# Patient Record
Sex: Male | Born: 1950
Health system: Southern US, Community
[De-identification: ages and names within clinical notes are randomized; demographics above are authoritative.]

## PROBLEM LIST (undated history)

## (undated) DIAGNOSIS — K219 Gastro-esophageal reflux disease without esophagitis: Secondary | ICD-10-CM

## (undated) DIAGNOSIS — C799 Secondary malignant neoplasm of unspecified site: Secondary | ICD-10-CM

## (undated) DIAGNOSIS — Z87442 Personal history of urinary calculi: Secondary | ICD-10-CM

## (undated) DIAGNOSIS — K429 Umbilical hernia without obstruction or gangrene: Secondary | ICD-10-CM

## (undated) DIAGNOSIS — I739 Peripheral vascular disease, unspecified: Secondary | ICD-10-CM

## (undated) DIAGNOSIS — C61 Malignant neoplasm of prostate: Secondary | ICD-10-CM

## (undated) DIAGNOSIS — I10 Essential (primary) hypertension: Secondary | ICD-10-CM

## (undated) DIAGNOSIS — Z973 Presence of spectacles and contact lenses: Secondary | ICD-10-CM

## (undated) DIAGNOSIS — E785 Hyperlipidemia, unspecified: Secondary | ICD-10-CM

## (undated) DIAGNOSIS — Z972 Presence of dental prosthetic device (complete) (partial): Secondary | ICD-10-CM

## (undated) DIAGNOSIS — Z89611 Acquired absence of right leg above knee: Secondary | ICD-10-CM

## (undated) HISTORY — PX: ROBOT ASSISTED LAPAROSCOPIC RADICAL PROSTATECTOMY: SHX5141

## (undated) HISTORY — PX: COLONOSCOPY: SHX174

## (undated) HISTORY — PX: TONSILLECTOMY: SUR1361

---

## 2005-05-04 ENCOUNTER — Ambulatory Visit (HOSPITAL_COMMUNITY): Admission: RE | Admit: 2005-05-04 | Discharge: 2005-05-04 | Payer: Self-pay | Admitting: General Surgery

## 2009-10-19 ENCOUNTER — Ambulatory Visit (HOSPITAL_COMMUNITY): Admission: RE | Admit: 2009-10-19 | Discharge: 2009-10-19 | Payer: Self-pay | Admitting: Family Medicine

## 2009-12-25 DIAGNOSIS — C61 Malignant neoplasm of prostate: Secondary | ICD-10-CM

## 2009-12-25 HISTORY — PX: ROBOT ASSISTED LAPAROSCOPIC RADICAL PROSTATECTOMY: SHX5141

## 2009-12-25 HISTORY — DX: Malignant neoplasm of prostate: C61

## 2010-01-07 ENCOUNTER — Ambulatory Visit (HOSPITAL_COMMUNITY): Admission: RE | Admit: 2010-01-07 | Discharge: 2010-01-07 | Payer: Self-pay | Admitting: Urology

## 2010-03-16 ENCOUNTER — Inpatient Hospital Stay (HOSPITAL_COMMUNITY): Admission: RE | Admit: 2010-03-16 | Discharge: 2010-03-18 | Payer: Self-pay | Admitting: Urology

## 2010-03-16 ENCOUNTER — Encounter (INDEPENDENT_AMBULATORY_CARE_PROVIDER_SITE_OTHER): Payer: Self-pay | Admitting: Urology

## 2010-11-24 IMAGING — CR DG PELVIS 1-2V
1 series · 1 of 1 positions shown · non-contrast
Comparison: [HOSPITAL] total body bone scan of nuclear
medicine 01/07/2010.

CLINICAL DATA: Prostate cancer.

PELVIS - 1-2 VIEW

[t pelvis a.p.]
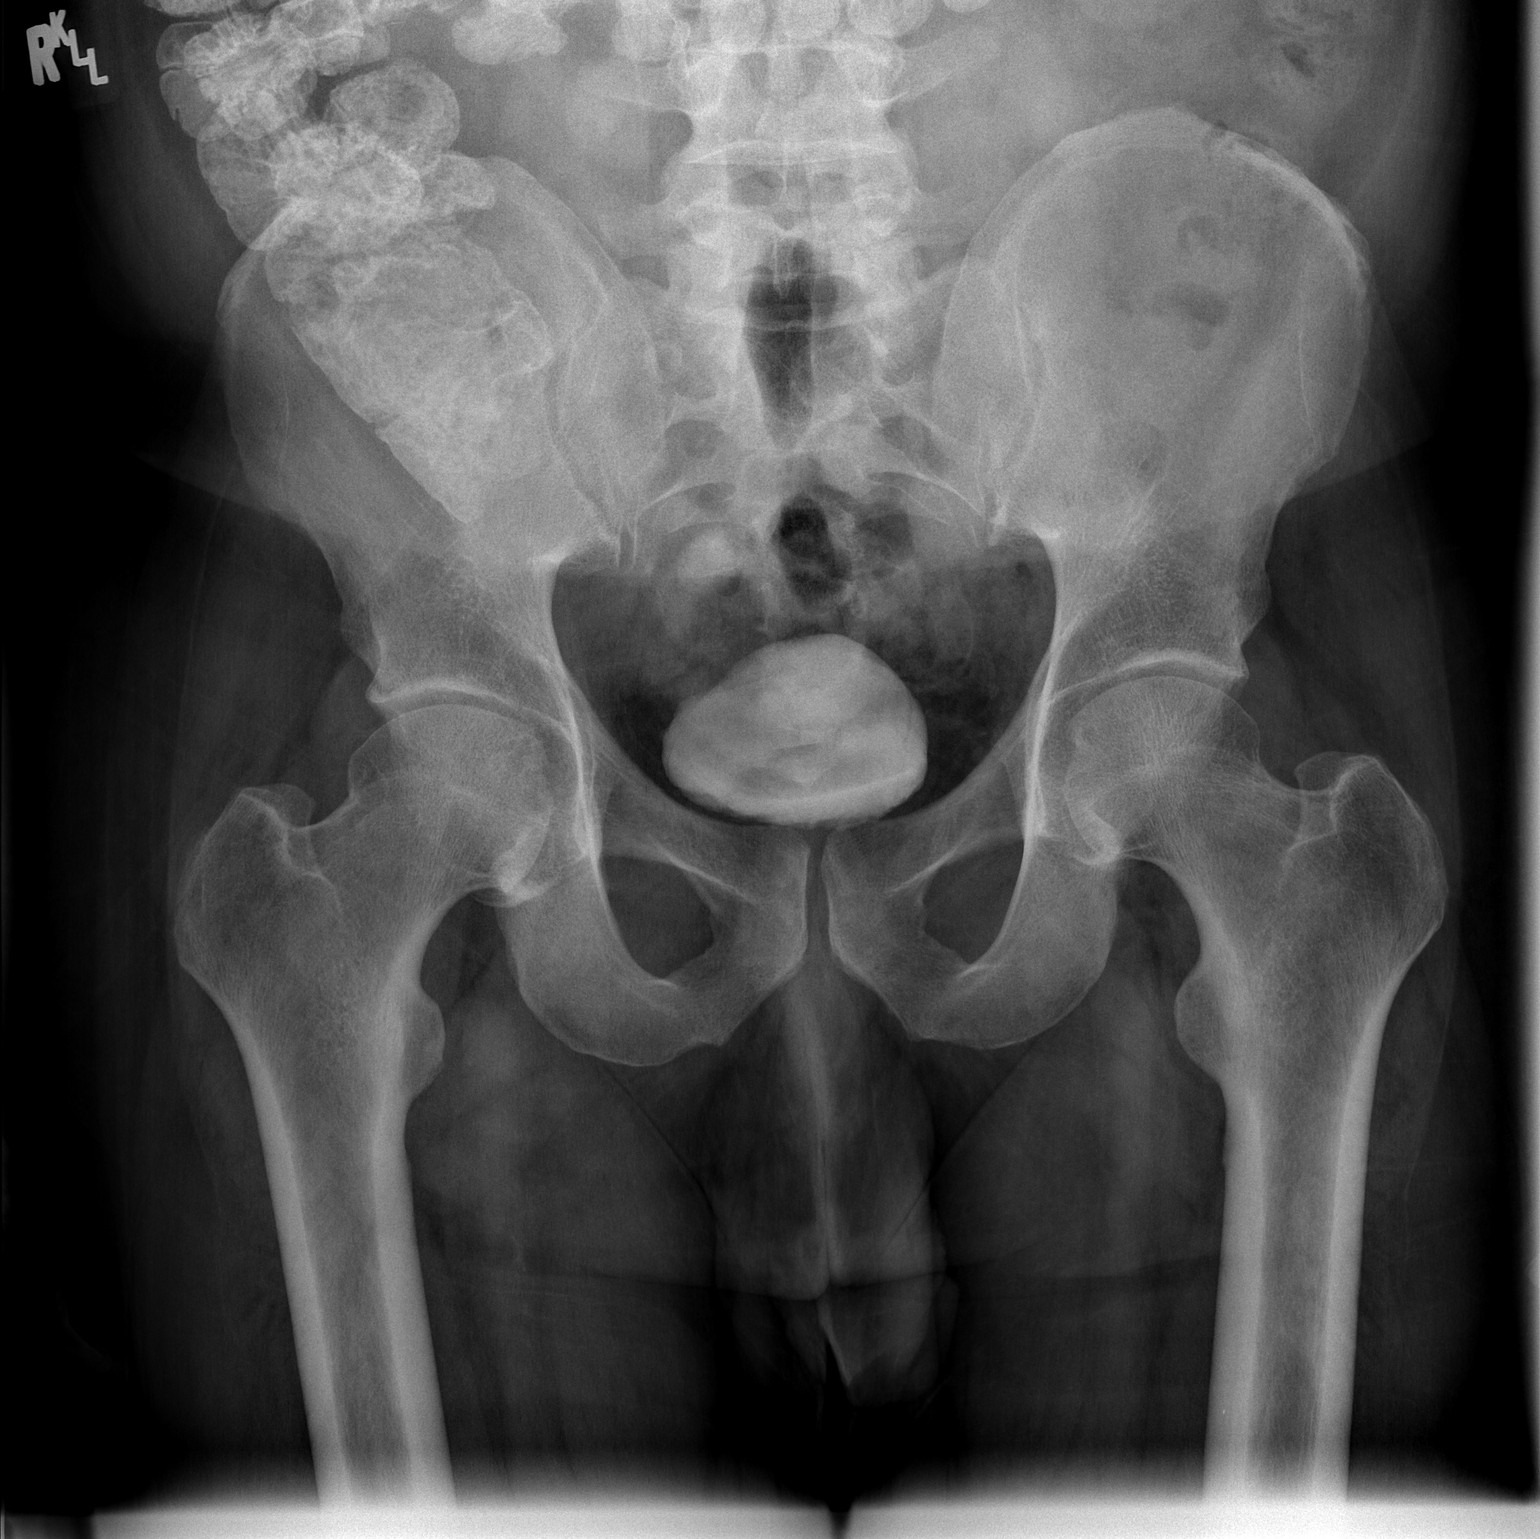

[1 of 1 positions shown; findings below may reference images not displayed]

FINDINGS: Bladder and intestinal contrast from probable preceding
abdominal pelvic CT visualized.  Visualized bones demonstrate no
osteoblastic metastatic disease (specifically at the right superior
sacroiliac joint in region of slight increased uptake on bone
scan).
IMPRESSION: Negative.

## 2011-02-01 ENCOUNTER — Encounter: Payer: Self-pay | Admitting: Internal Medicine

## 2011-02-07 ENCOUNTER — Encounter: Payer: Self-pay | Admitting: Internal Medicine

## 2011-02-09 NOTE — Letter (Signed)
Summary: TCS TRIAGE  TCS TRIAGE   Imported By: Rexene Alberts 02/01/2011 13:43:41  _____________________________________________________________________  External Attachment:    Type:   Image     Comment:   External Document  Appended Document: TCS TRIAGE ok as is  Appended Document: [facility]CS TRIAGE MAILED PATIENT INSTRUCTIONS

## 2011-02-14 ENCOUNTER — Ambulatory Visit (HOSPITAL_COMMUNITY)
Admission: RE | Admit: 2011-02-14 | Discharge: 2011-02-14 | Disposition: A | Discharge status: Home / Self Care | Payer: BC Managed Care – PPO | Source: Ambulatory Visit | Attending: Internal Medicine | Admitting: Internal Medicine

## 2011-02-14 ENCOUNTER — Encounter: Payer: BC Managed Care – PPO | Admitting: Internal Medicine

## 2011-02-14 ENCOUNTER — Other Ambulatory Visit: Payer: Self-pay | Admitting: Internal Medicine

## 2011-02-14 DIAGNOSIS — I10 Essential (primary) hypertension: Secondary | ICD-10-CM | POA: Insufficient documentation

## 2011-02-14 DIAGNOSIS — Z09 Encounter for follow-up examination after completed treatment for conditions other than malignant neoplasm: Secondary | ICD-10-CM

## 2011-02-14 DIAGNOSIS — Z7982 Long term (current) use of aspirin: Secondary | ICD-10-CM | POA: Insufficient documentation

## 2011-02-14 DIAGNOSIS — Z860100 Personal history of colon polyps, unspecified: Secondary | ICD-10-CM | POA: Insufficient documentation

## 2011-02-14 DIAGNOSIS — K648 Other hemorrhoids: Secondary | ICD-10-CM

## 2011-02-14 DIAGNOSIS — D126 Benign neoplasm of colon, unspecified: Secondary | ICD-10-CM

## 2011-02-14 DIAGNOSIS — Z79899 Other long term (current) drug therapy: Secondary | ICD-10-CM | POA: Insufficient documentation

## 2011-02-16 NOTE — Op Note (Signed)
  NAME:  [name], [name]           ACCOUNT NO.:  [id number]  MEDICAL RECORD NO.:  [id number]           PATIENT TYPE:  O  LOCATION:  DAYP                          FACILITY:  [facility]  PHYSICIAN:  R. Roetta Sessions, M.D. DATE OF BIRTH:  1951/10/11  DATE OF PROCEDURE:  02/14/2011 DATE OF DISCHARGE:                              OPERATIVE REPORT   PROCEDURE:  Ileal colonoscopy with snare polypectomy, polyp ablation.  INDICATIONS FOR PROCEDURE:  A 60 year old gentleman who underwent colonoscopy in 2006 by Dr. Katrinka Blazing, multiple left colon polyps were removed that were adenomatous.  He has no lower GI tract symptoms.  He is here for surveillance.  Risks, benefits, limitations, alternatives, imponderables have been reviewed.  Questions have been answered.  Please see the documentation in the medical record.  PROCEDURE NOTE:  O2 saturation, blood pressure, pulse, respirations were monitored throughout the entirety of the procedure.  CONSCIOUS SEDATION:  Versed 5 mg IV, Demerol 75 mg IV in divided doses.  INSTRUMENT:  Pentax video chip system.  FINDINGS:  Digital rectal exam revealed no abnormalities.  Endoscopic findings:  Prep was adequate.  Colon:  Colonic mucosa was surveyed from the rectosigmoid junction through the left transverse right colon to the appendiceal orifice, ileocecal valve/cecum.  These structures were well seen and photographed for the record.  Terminal ileum was abated to 5 cm.  From this level, scope was slowly and cautiously withdrawn.  All previously mentioned mucosal surfaces were again seen.  The patient was noted have a 5-mm polyp in the ascending colon which was cold snared and recovered through the scope.  There were also multiple descending polyps which were cold snared as well, and there were some diminutive sigmoid polyps which were ablated with the tip of the hot snare cautery unit. Remainder of colonic mucosa appeared unremarkable.  Scope was pulled down to  the rectum where thorough examination of rectal mucosa including retroflexed view of the anal verge demonstrated only internal hemorrhoids.  The patient tolerated the procedure well.  Cecal withdrawal time 15 minutes.  IMPRESSION: 1. Internal hemorrhoids, otherwise normal rectum. 2. Diminutive sigmoid polyp status post hot snare ablation, ascending     polyps and descending polyp status post cold snare polypectomy.     Remainder of colonic mucosa appeared unremarkable.  Normal terminal     ileum.  RECOMMENDATIONS: 1. Follow up on path. 2. Diverticulosis literature provided to [name].     Jonathon Bellows, M.D.     RMR/MEDQ  D:  02/14/2011  T:  02/14/2011  Job:  [id number]  cc:   Annia Friendly. Loleta Chance, MD Fax: [phone]  Electronically Signed by Lorrin Goodell M.D. on 02/16/2011 08:50:06 AM

## 2011-02-19 ENCOUNTER — Encounter: Payer: Self-pay | Admitting: Internal Medicine

## 2011-02-20 ENCOUNTER — Encounter: Payer: Self-pay | Admitting: Internal Medicine

## 2011-03-02 NOTE — Letter (Addendum)
Summary: Patient Notice, Colon Biopsy Results  [facility]  [address]   [place], [place] [address]   Phone: [phone]  Fax: [phone]       February 20, 2011   [name] [address] [place], [place]  [address] [birth year]    Dear [name],  I am pleased to inform you that the biopsies taken during your recent colonoscopy did not show any evidence of cancer upon pathologic examination.  Additional information/recommendations:  No further action is needed at this time.  Please follow-up with your primary care physician for your other healthcare needs.  You should have a repeat colonoscopy examination  in 5 years.  Please call us if you are having persistent problems or have questions about your condition that have not been fully answered at this time.  Sincerely,    R. Roetta Sessions MD, FACP [facility] Ph: [phone]    Fax: [phone]   Appended Document: Patient Notice, Colon Biopsy Results letter mailed to pt  Appended Document: Patient Notice, Colon Biopsy Results reminder in epic

## 2011-03-02 NOTE — Letter (Addendum)
Summary: Patient Notice, Colon Biopsy Results  [facility]  [address]   [place], [place] [address]   Phone: [phone]  Fax: [phone]       February 19, 2011   [name] [address] [place], [place]  [address] [birth year]    Dear [name],  I am pleased to inform you that the biopsies taken during your recent colonoscopy did not show any evidence of cancer upon pathologic examination.  Additional information/recommendations:  No further action is needed at this time.  Please follow-up with your primary care physician for your other healthcare needs.  You should have a repeat colonoscopy examination  in 5 years.  Please call us if you are having persistent problems or have questions about your condition that have not been fully answered at this time.  Sincerely,    R. Roetta Sessions MD, FACP [address] [facility] Ph: [phone]    Fax: [phone]   Appended Document: Patient Notice, Colon Biopsy Results letter mailed to pt  Appended Document: Patient Notice, Colon Biopsy Results reminder in epic

## 2011-03-20 LAB — TYPE AND SCREEN: Antibody Screen: NEGATIVE

## 2011-03-20 LAB — COMPREHENSIVE METABOLIC PANEL
ALT: 16 U/L (ref 0–53)
AST: 18 U/L (ref 0–37)
Albumin: 4.1 g/dL (ref 3.5–5.2)
Alkaline Phosphatase: 68 U/L (ref 39–117)
BUN: 25 mg/dL — ABNORMAL HIGH (ref 6–23)
CO2: 30 mEq/L (ref 19–32)
Calcium: 9.6 mg/dL (ref 8.4–10.5)
Chloride: 106 mEq/L (ref 96–112)
GFR calc non Af Amer: >60 mL/min (ref >60)
Glucose, Bld: 123 mg/dL — ABNORMAL HIGH (ref 70–99)
Potassium: 4.3 mEq/L (ref 3.5–5.1)
Total Protein: 7.2 g/dL (ref 6.0–8.3)

## 2011-03-20 LAB — BASIC METABOLIC PANEL
BUN: 19 mg/dL (ref 6–23)
CO2: 27 mEq/L (ref 19–32)
Creatinine, Ser: 1.46 mg/dL (ref 0.4–1.5)
GFR calc non Af Amer: 25 mL/min — ABNORMAL LOW (ref >60)
GFR calc non Af Amer: 49 mL/min — ABNORMAL LOW (ref >60)
Glucose, Bld: 117 mg/dL — ABNORMAL HIGH (ref 70–99)
Potassium: 4.2 mEq/L (ref 3.5–5.1)
Sodium: 138 mEq/L (ref 135–145)

## 2011-03-20 LAB — MYOGLOBIN, URINE: Myoglobin, Ur: <27 mcg/L (ref <28)

## 2011-03-20 LAB — HEMOGLOBIN AND HEMATOCRIT, BLOOD
HCT: 31.5 % — ABNORMAL LOW (ref 39.0–52.0)
Hemoglobin: 10.6 g/dL — ABNORMAL LOW (ref 13.0–17.0)

## 2011-03-20 LAB — SODIUM, URINE, RANDOM: Sodium, Ur: 32 mEq/L

## 2011-03-20 LAB — CBC
MCHC: 33 g/dL (ref 30.0–36.0)
Platelets: 342 10*3/uL (ref 150–400)
RBC: 3.97 MIL/uL — ABNORMAL LOW (ref 4.22–5.81)

## 2011-03-20 LAB — CREATININE, URINE, RANDOM: Creatinine, Urine: 85.8 mg/dL

## 2011-03-20 LAB — CREATININE, FLUID (PLEURAL, PERITONEAL, JP DRAINAGE): Creat, Fluid: 2.4 mg/dL

## 2011-05-12 NOTE — H&P (Signed)
NAMEYOTAM, RHINE           ACCOUNT NO.:  [id number]   MEDICAL RECORD NO.:  [id number]          PATIENT TYPE:  AMB   LOCATION:  DAY                           FACILITY:  [facility]   PHYSICIAN:  Jerolyn Shin C. Katrinka Blazing, M.D.   DATE OF BIRTH:  Oct 14, 1951   DATE OF ADMISSION:  DATE OF DISCHARGE:  LH                                HISTORY & PHYSICAL   REASON FOR ADMISSION:  The patient has history of guaiac-positive stools and  need for screening colonoscopy.  He has had some periods of constipation.  He has not had any dark stools or bright red rectal bleeding.  He is having  screening colonoscopy.   MEDICATIONS:  None.   PAST SURGICAL HISTORY:  None.   PAST MEDICAL HISTORY:  History of kidney stones.   REVIEW OF SYSTEMS:  Negative.   PHYSICAL EXAMINATION:  VITAL SIGNS:  Blood pressure 122/74, pulse 64,  respirations 20, weight 222 pounds.  HEENT:  Unremarkable except for poor dentition and multiple cavities.  NECK:  Supple.  No JVD, bruits, adenopathy, or thyromegaly.  CHEST:  Clear to auscultation. No rales, rhonchi or wheezes.  HEART:  Regular rate and rhythm without murmur, gallop or rub.  ABDOMEN:  Soft, nontender.  No masses.  EXTREMITIES:  No cyanosis, clubbing or edema.  NEUROLOGIC:  No focal motor, sensory or cerebellar deficits.   IMPRESSION:  1.  Guaiac-positive stools.  2.  Benign prostatic hypertrophy.  3.  History of kidney stones.   PLAN:  Colonoscopy.      LCS/MEDQ  D:  05/03/2005  T:  05/04/2005  Job:  [id number]   cc:   [facility]  Fax: [phone]

## 2016-02-14 ENCOUNTER — Encounter: Payer: Self-pay | Admitting: Internal Medicine

## 2017-03-07 ENCOUNTER — Ambulatory Visit (INDEPENDENT_AMBULATORY_CARE_PROVIDER_SITE_OTHER): Payer: BLUE CROSS/BLUE SHIELD | Admitting: Orthopedic Surgery

## 2017-03-07 ENCOUNTER — Encounter: Payer: Self-pay | Admitting: Orthopedic Surgery

## 2017-03-07 VITALS — BP 137/86 | HR 85 | Wt 228.0 lb

## 2017-03-07 DIAGNOSIS — M6530 Trigger finger, unspecified finger: Secondary | ICD-10-CM | POA: Diagnosis not present

## 2017-03-07 NOTE — Progress Notes (Signed)
Patient ID: [name], male   DOB: [birth year], 66 y.o.   MRN: [id number]  Chief Complaint  Patient presents with  . Hand Problem    TRIGGER FINGER RT LONG    HPI [name] is a 66 y.o. male.  Presents with several year history of catching locking and pain over the A1 pulley of the right ring finger. He has tried splinting and medication without success presents for evaluation and treatment  Review of Systems Review of Systems  Constitutional: Positive for fever.  Skin: Negative for color change.  Neurological: Negative for weakness and numbness.   Social History Social History  Substance Use Topics  . Smoking status: Current Some Day Smoker  . Smokeless tobacco: Never Used  . Alcohol use Not on file    Allergies not on file  Current Outpatient Prescriptions  Medication Sig Dispense Refill  . Esomeprazole Magnesium (NEXIUM PO) Take by mouth.    . OXYBUTYNIN CHLORIDE PO Take by mouth.     No current facility-administered medications for this visit.      Physical Exam Physical Exam Blood pressure 137/86, pulse 85, weight 228 lb (103.4 kg). Appearance, there are no abnormalities in terms of appearance the patient was well-developed and well-nourished. The grooming and hygiene were normal.  Mental status orientation, there was normal alertness and orientation Mood pleasant Ambulatory status normal with no assistive devices  Examination of the Right hand reveals tenderness over the A1 pulley of the ring finger Range of motion remains normal with clicking on flexion extension.  Stability tests show no abnormality of the IP joints are MP joint. The FDP and FDS strength is normal Skin warm dry and intact without laceration or ulceration or erythema Neurologic examination normal sensation Vascular examination normal pulses with warm extremity and normal capillary refill  The opposite extremity normal range of motion    Data Reviewed No  x-rays  Assessment  Encounter Diagnosis  Name Primary?  Marland Kitchen Acquired trigger finger Yes      Plan  Right ring finger Medication  1 mL of 40 mg Depo-Medrol  2 mL of 1% lidocaine plain  Ethyl chloride for anesthesia  Verbal consent was obtained timeout was taken to confirm the injection site as right thumb  Alcohol was used to prepare the skin along with ethyl chloride and then the injection was made at the A1 pulley there were no complications

## 2019-02-21 ENCOUNTER — Encounter: Payer: Self-pay | Admitting: *Deleted

## 2019-02-25 ENCOUNTER — Other Ambulatory Visit: Payer: Self-pay | Admitting: Urology

## 2019-02-25 DIAGNOSIS — C61 Malignant neoplasm of prostate: Secondary | ICD-10-CM

## 2019-03-03 ENCOUNTER — Encounter: Payer: Self-pay | Admitting: Radiation Oncology

## 2019-03-03 NOTE — Progress Notes (Signed)
Opened in error

## 2019-03-04 ENCOUNTER — Telehealth: Payer: Self-pay | Admitting: Radiation Oncology

## 2019-03-04 ENCOUNTER — Ambulatory Visit
Admission: RE | Admit: 2019-03-04 | Discharge: 2019-03-04 | Disposition: A | Discharge status: Home / Self Care | Payer: Medicare Other | Source: Ambulatory Visit | Attending: Radiation Oncology | Admitting: Radiation Oncology

## 2019-03-04 ENCOUNTER — Ambulatory Visit: Payer: Medicare Other

## 2019-03-04 DIAGNOSIS — F17210 Nicotine dependence, cigarettes, uncomplicated: Secondary | ICD-10-CM | POA: Insufficient documentation

## 2019-03-04 DIAGNOSIS — Z7982 Long term (current) use of aspirin: Secondary | ICD-10-CM | POA: Insufficient documentation

## 2019-03-04 DIAGNOSIS — Z8042 Family history of malignant neoplasm of prostate: Secondary | ICD-10-CM | POA: Insufficient documentation

## 2019-03-04 DIAGNOSIS — C61 Malignant neoplasm of prostate: Secondary | ICD-10-CM | POA: Insufficient documentation

## 2019-03-04 DIAGNOSIS — Z800 Family history of malignant neoplasm of digestive organs: Secondary | ICD-10-CM | POA: Insufficient documentation

## 2019-03-04 HISTORY — DX: Malignant neoplasm of prostate: C61

## 2019-03-04 NOTE — Telephone Encounter (Signed)
Patient hasn't shown for 0930 appointment. Phoned patient to inquire. Patient expressed need to reschedule. Immediately transferred patient to Suanne Marker to reschedule.

## 2019-03-05 ENCOUNTER — Other Ambulatory Visit: Payer: Self-pay

## 2019-03-05 ENCOUNTER — Encounter (HOSPITAL_COMMUNITY)
Admission: RE | Admit: 2019-03-05 | Discharge: 2019-03-05 | Disposition: A | Discharge status: Home / Self Care | Payer: Medicare Other | Source: Ambulatory Visit | Attending: Urology | Admitting: Urology

## 2019-03-05 DIAGNOSIS — N200 Calculus of kidney: Secondary | ICD-10-CM | POA: Diagnosis not present

## 2019-03-05 DIAGNOSIS — C61 Malignant neoplasm of prostate: Secondary | ICD-10-CM | POA: Insufficient documentation

## 2019-03-05 DIAGNOSIS — N210 Calculus in bladder: Secondary | ICD-10-CM | POA: Diagnosis not present

## 2019-03-05 DIAGNOSIS — R918 Other nonspecific abnormal finding of lung field: Secondary | ICD-10-CM | POA: Diagnosis not present

## 2019-03-05 MED ORDER — TECHNETIUM TC 99M MEDRONATE IV KIT
21.2000 | PACK | Freq: Once | INTRAVENOUS | Status: AC | PRN
  Administered 2019-03-05: 21.2 via INTRAVENOUS

## 2019-03-06 ENCOUNTER — Encounter: Payer: Self-pay | Admitting: *Deleted

## 2019-03-17 ENCOUNTER — Telehealth: Payer: Self-pay | Admitting: Radiation Oncology

## 2019-03-17 NOTE — Telephone Encounter (Signed)
Phoned to confirm appointment for tomorrow morning. Patient denies fever, cough or SOB. Patient denies being in close contact with anyone who has been diagnosed with CVD 19. Explained that for safety reasons we are only allowing the patients in the building. However we will work with the patient to include their family member via phone. Patient verbalized understanding and expressed appreciation for the call.

## 2019-03-18 ENCOUNTER — Encounter: Payer: Self-pay | Admitting: Radiation Oncology

## 2019-03-18 ENCOUNTER — Ambulatory Visit
Admission: RE | Admit: 2019-03-18 | Discharge: 2019-03-18 | Disposition: A | Discharge status: Home / Self Care | Payer: Medicare Other | Source: Ambulatory Visit | Attending: Radiation Oncology | Admitting: Radiation Oncology

## 2019-03-18 ENCOUNTER — Other Ambulatory Visit: Payer: Self-pay

## 2019-03-18 DIAGNOSIS — C61 Malignant neoplasm of prostate: Secondary | ICD-10-CM

## 2019-03-18 DIAGNOSIS — Z8042 Family history of malignant neoplasm of prostate: Secondary | ICD-10-CM | POA: Diagnosis not present

## 2019-03-18 DIAGNOSIS — Z800 Family history of malignant neoplasm of digestive organs: Secondary | ICD-10-CM | POA: Diagnosis not present

## 2019-03-18 DIAGNOSIS — Z7982 Long term (current) use of aspirin: Secondary | ICD-10-CM | POA: Diagnosis not present

## 2019-03-18 DIAGNOSIS — F17210 Nicotine dependence, cigarettes, uncomplicated: Secondary | ICD-10-CM | POA: Diagnosis not present

## 2019-03-18 NOTE — Progress Notes (Signed)
See progress note under physician encounter. 

## 2019-03-18 NOTE — Progress Notes (Addendum)
GU Location of Tumor / Histology: prostatic adenocarcinoma  If Prostate Cancer, Gleason Score is (3 + 4) and PSA is (13.3) at diagnosis.   [name] s/p RALP in 2011. In December 2014 PSA was up to 0.16. He understood that this was highly suggestive of a biochemical recurrence. He elected not to proceed with an attempted salvage radiation therapy and chose ongoing surveillance. Repeat PSA in 05/2014 was 0.17.   01/2018             PSA                 0.51 07/2017             PSA                 0.39   Biopsies of prostate (if applicable) revealed:    Past/Anticipated interventions by urology, if any: prostate biopsy, prostatectomy, referral for consideration of radiation therapy, bone scan (negative).  Past/Anticipated interventions by medical oncology, if any: no  Weight changes, if any: no  Bowel/Bladder complaints, if any: AUA 2. SHIM 3. Reports occasional stress incontinence requiring 1-2 pads per day if he is at home or up to six ppd if he is working (explains he walks a lot at work). Denies dysuria or hematuria.   Nausea/Vomiting, if any: no  Pain issues, if any:  no  SAFETY ISSUES:  Prior radiation? no  Pacemaker/ICD? no  Possible current pregnancy? no, male patient  Is the patient on methotrexate? no  Current Complaints / other details:  68 year old male. Single. Has one son. Current everyday smoker. Mother-gastric ca. Father-prostate ca.

## 2019-03-18 NOTE — Progress Notes (Signed)
Radiation Oncology         [phone] ________________________________  Initial Outpatient Consultation - Conducted via Webex due to current COVID-19 concerns for limiting patient exposure  Name: [name] MRN: [id number]  Date: 03/18/2019  DOB: 03/24/1951  HE:RDEY, Berneta Sages, MD  Davis Gourd*   REFERRING PHYSICIAN: Davis Gourd*  DIAGNOSIS: 68 y.o. gentleman with biochemical recurrence of prostate cancer with PSA of 0.69.    ICD-10-CM   1. Malignant neoplasm of prostate ([place]) C61     HISTORY OF PRESENT ILLNESS: [name] is a 68 y.o. male with a diagnosis of biochemically recurrent prostate cancer. His prostate cancer was initially diagnosed by Dr. Diona Fanti in January 2011 as stage T1c, Gleason 4+3 with a pre-treatment PSA of 13.3.  He subsequently underwent prostatectomy with Dr. Risa Grill on 03/16/2010.  Final pathology revealed stage pT2c, Gleason 3+4 prostatic adenocarcinoma involving both lobes and the apex.  Surgical margins, seminal vesicles, and two pelvic lymph nodes were negative, and there was no extraprostatic extension or lymphovascular invasion.  Initial postoperative PSA was 0.0.  However, over the next few years his PSA did increase to 0.06-0.07 range and increased to 0.10 in July 2013.  In December 2014, his PSA was up to 0.16. He understood that this was highly suggestive of a biochemical recurrence but elected not to proceed with salvage therapy at that time and chose ongoing surveillance with PSA's as follows:  PSA History: 01/2019: PSA 0.69 01/2018: PSA 0.51- transitioned care to Dr. Lovena Neighbours 07/2017: PSA 0.39 01/2017: PSA 0.35 06/2016: PSA 0.29 12/2015: PSA 0.34 06/2015: PSA 0.30 11/2014: PSA 0.23 05/2014: PSA 0.17 11/2013: PSA 0.16  The patient's care was transitioned to Dr. Lovena Neighbours in 2019 who has continued to follow him closely.  He underwent restaging imaging with a CT C/A/P on 03/05/2019 which did not show any evidence of  metastatic disease. A bone scan performed that same day did not show any evidence of osseous metastatic disease.   The patient reviewed the PSA results with his urologist and he has kindly been referred today for discussion of salvage radiation treatment.    PREVIOUS RADIATION THERAPY: No  PAST MEDICAL HISTORY:  Past Medical History:  Diagnosis Date  . Prostate cancer ([place])       PAST SURGICAL HISTORY: Past Surgical History:  Procedure Laterality Date  . ROBOT ASSISTED LAPAROSCOPIC RADICAL PROSTATECTOMY    . TONSILLECTOMY      FAMILY HISTORY:  Family History  Problem Relation Age of Onset  . Gastric cancer Mother   . Prostate cancer Father   . Breast cancer Neg Hx   . Pancreatic cancer Neg Hx     SOCIAL HISTORY:  Social History   Socioeconomic History  . Marital status: Single    Spouse name: Not on file  . Number of children: 1  . Years of education: Not on file  . Highest education level: Not on file  Occupational History    Comment: customer service   Social Needs  . Financial resource strain: Not on file  . Food insecurity:    Worry: Not on file    Inability: Not on file  . Transportation needs:    Medical: Not on file    Non-medical: Not on file  Tobacco Use  . Smoking status: Current Some Day Smoker    Packs/day: 0.25    Years: 21.00    Pack years: 5.25    Types: Cigarettes  . Smokeless tobacco: Never Used  Substance and Sexual Activity  . Alcohol use: Never    Frequency: Never  . Drug use: Never  . Sexual activity: Not Currently  Lifestyle  . Physical activity:    Days per week: Not on file    Minutes per session: Not on file  . Stress: Not on file  Relationships  . Social connections:    Talks on phone: Not on file    Gets together: Not on file    Attends religious service: Not on file    Active member of club or organization: Not on file    Attends meetings of clubs or organizations: Not on file    Relationship status: Not on file  .  Intimate partner violence:    Fear of current or ex partner: Not on file    Emotionally abused: Not on file    Physically abused: Not on file    Forced sexual activity: Not on file  Other Topics Concern  . Not on file  Social History Narrative   Has one son.     ALLERGIES: Shellfish allergy  MEDICATIONS:  Current Outpatient Medications  Medication Sig Dispense Refill  . aspirin EC 81 MG tablet Take 81 mg by mouth daily.    . Esomeprazole Magnesium (NEXIUM PO) Take by mouth.    Marland Kitchen lisinopril-hydrochlorothiazide (PRINZIDE,ZESTORETIC) 10-12.5 MG tablet Take 1 tablet by mouth daily.    . Multiple Vitamins-Minerals (CENTRUM SILVER PO) Take by mouth.    . oxybutynin (DITROPAN-XL) 10 MG 24 hr tablet Take 10 mg by mouth daily.     No current facility-administered medications for this encounter.     REVIEW OF SYSTEMS:  On review of systems, the patient reports that he is doing well overall. He denies any chest pain, shortness of breath, cough, fevers, chills, night sweats, or unintended weight changes. He denies any bowel disturbances, and denies abdominal pain, nausea or vomiting. He denies any new musculoskeletal or joint aches or pains. His IPSS was 2. He reports occasional stress incontinence requiring 1-2 pads per day if he is at home or up to 6 pads per day if he is working (explains he walks a lot at work). He denies dysuria or hematuria. His SHIM was 3, indicating he does have severe erectile dysfunction. A complete review of systems is obtained and is otherwise negative.    PHYSICAL EXAM:  Wt Readings from Last 3 Encounters:  03/07/17 228 lb (103.4 kg)   Temp Readings from Last 3 Encounters:  No data found for Temp   BP Readings from Last 3 Encounters:  03/07/17 137/86   Pulse Readings from Last 3 Encounters:  03/07/17 85   Pain Assessment Pain Score: 0-No pain/10  In general this is a well appearing African-American male in no acute distress. He is alert and oriented x4.  Remainder of physical exam is deferred due to virtual consult.  KPS = 100  100 - Normal; no complaints; no evidence of disease. 90   - Able to carry on normal activity; minor signs or symptoms of disease. 80   - Normal activity with effort; some signs or symptoms of disease. 21   - Cares for self; unable to carry on normal activity or to do active work. 60   - Requires occasional assistance, but is able to care for most of his personal needs. 50   - Requires considerable assistance and frequent medical care. 49   - Disabled; requires special care and assistance. 30   - Severely  disabled; hospital admission is indicated although death not imminent. 58   - Very sick; hospital admission necessary; active supportive treatment necessary. 10   - Moribund; fatal processes progressing rapidly. 0     - Dead  Karnofsky DA, Abelmann [place], Craver LS and Burchenal JH [phone]) The use of the nitrogen mustards in the palliative treatment of carcinoma: with particular reference to bronchogenic carcinoma Cancer 1 634-56  LABORATORY DATA:  Lab Results  Component Value Date   WBC 11.2 (H) 03/11/2010   HGB 10.6 (L) 03/17/2010   HCT 31.5 (L) 03/17/2010   MCV 97.8 03/11/2010   PLT 342 03/11/2010   Lab Results  Component Value Date   NA 138 03/18/2010   K 4.4 03/18/2010   CL 105 03/18/2010   CO2 27 03/18/2010   Lab Results  Component Value Date   ALT 16 03/11/2010   AST 18 03/11/2010   ALKPHOS 68 03/11/2010   BILITOT 0.7 03/11/2010     RADIOGRAPHY: Nm Bone Scan Whole Body  Result Date: 03/06/2019 CLINICAL DATA:  68 year old male for restaging of prostate cancer. PSA level of 0.69 on 02/20/2019 EXAM: NUCLEAR MEDICINE WHOLE BODY BONE SCAN TECHNIQUE: Whole body anterior and posterior images were obtained approximately 3 hours after intravenous injection of radiopharmaceutical. RADIOPHARMACEUTICALS:  21.2 mCi Technetium-56m MDP IV COMPARISON:  01/07/2010 FINDINGS: No abnormal areas of bony activity  noted to suggest osseous metastatic disease. No significant abnormalities identified. IMPRESSION: No evidence of osseous metastatic disease. Electronically Signed   By: Margarette Canada M.D.   On: 03/06/2019 08:47      IMPRESSION/PLAN: 1. 68 y.o. gentleman with biochemical recurrence of prostate cancer with PSA of 0.69.  Today we reviewed the findings and workup thus far.  We discussed the natural history of prostate cancer.  We reviewed the the implications of rising post-operative PSA on the risk of prostate cancer recurrence. We reviewed some of the evidence suggesting an advantage for patients who undergo salvage radiotherapy in the setting in terms of disease control and overall survival. We discussed radiation treatment directed to the prostatic fossa with regard to the logistics and delivery of external beam radiation treatment. We also detailed the role of ADT in the treatment of biochemically recurrent prostate cancer and outlined the associated side effects that could be expected with this therapy.  At the end of the conversation the patient is interested in moving forward with ADT in combination with salvage radiotherapy to the prostatic fossa. We will share our discussion with Dr. Lovena Neighbours and proceed with start of ADT now with plans to start radiation in the next 2-3 months pending restrictions are lifted surrounding the current COVID-19 pandemic.  This encounter was provided by telemedicine platform Webex.  The patient has given verbal consent for this type of encounter and has been advised to only accept a meeting of this type in a secure network environment. The time spent during this encounter was 60 minutes. The attendants for this meeting include Tyler Pita MD, Freeman Caldron PA-C, scribe Clinton Sawyer, patient [name] and his sister, Ambulance person. During the encounter, Tyler Pita MD, Ashlyn Bruning PA-C, and scribe Clinton Sawyer were located at [facility].  Patient [name] and his sister, Bosie Helper, were located at home.    Nicholos Johns, PA-C    Tyler Pita, MD  [facility] Direct Dial: [phone]  Fax: [phone] [email or web address]  Skype  [email or web address]  This  document serves as a record of services personally performed by Tyler Pita, MD and Freeman Caldron, PA-C. It was created on their behalf by Rae Lips, a trained medical scribe. The creation of this record is based on the scribe's personal observations and the providers' statements to them. This document has been checked and approved by the attending providers.

## 2019-03-20 ENCOUNTER — Telehealth: Payer: Self-pay | Admitting: Medical Oncology

## 2019-03-20 NOTE — Progress Notes (Signed)
Left a message to introduce myself as the prostate nurse navigator and my role. Patient's consult was done via Webex due to patient safety concerns with COVID-19. Patient will need androgen deprivation and radiation. I asked [name] to return my call with concerns and if he does not receive an appointment for ADT within a week.

## 2019-04-08 DIAGNOSIS — Z7189 Other specified counseling: Secondary | ICD-10-CM | POA: Diagnosis not present

## 2019-04-08 DIAGNOSIS — N390 Urinary tract infection, site not specified: Secondary | ICD-10-CM | POA: Diagnosis not present

## 2019-04-08 DIAGNOSIS — I10 Essential (primary) hypertension: Secondary | ICD-10-CM | POA: Diagnosis not present

## 2019-04-09 ENCOUNTER — Telehealth: Payer: Self-pay | Admitting: Radiation Oncology

## 2019-04-09 NOTE — Telephone Encounter (Signed)
Received voicemail message from patient requesting return call. Phoned patient back promptly. Patient questions where he should fax his FMLA paperwork to. I provided him with fax number [phone]. Patient questions when he will start radiation. I explained an exact date hasn't been set because its dependent upon him receiving his ADT. Patient reports receiving an injection in his hip last Tuesday (04/01/2019) at his urologist office. Explained he can expect his XRT to begin approximately 2-3 month from then. Also, explained there is a 7-10 day turn around for [facility] paperwork. Patient verbalized understanding of all reviewed and expressed appreciation for the return call.

## 2019-04-11 ENCOUNTER — Telehealth: Payer: Self-pay | Admitting: *Deleted

## 2019-04-11 ENCOUNTER — Telehealth: Payer: Self-pay | Admitting: Radiation Oncology

## 2019-04-11 NOTE — Telephone Encounter (Signed)
Received and completed FMLA paperwork received from [facility] on patient's behalf. Place completed paperwork in [place], PA-C box to sign.

## 2019-04-11 NOTE — Telephone Encounter (Signed)
Called patient to inform of [place] Appt. and sim for 06-17-19, lvm for a return call

## 2019-04-14 ENCOUNTER — Encounter: Payer: Self-pay | Admitting: Radiation Oncology

## 2019-04-14 ENCOUNTER — Telehealth: Payer: Self-pay | Admitting: Radiation Oncology

## 2019-04-14 NOTE — Telephone Encounter (Signed)
Faxed FMLA paperwork to [employer] at [phone] and rec'd a confirmation. Paperwork will be scanned.

## 2019-05-01 ENCOUNTER — Encounter: Payer: Self-pay | Admitting: Medical Oncology

## 2019-05-12 ENCOUNTER — Telehealth: Payer: Self-pay | Admitting: *Deleted

## 2019-05-12 NOTE — Telephone Encounter (Signed)
RETURNED PATIENT'S PHONE CALL, SPOKE WITH PATIENT. ?

## 2019-05-23 ENCOUNTER — Telehealth: Payer: Self-pay | Admitting: *Deleted

## 2019-05-23 NOTE — Telephone Encounter (Signed)
RETURNED PATIENT'S PHONE CALL, SPOKE WITH PATIENT. ?

## 2019-06-17 ENCOUNTER — Ambulatory Visit
Admission: RE | Admit: 2019-06-17 | Discharge: 2019-06-17 | Disposition: A | Discharge status: Home / Self Care | Payer: Medicare Other | Source: Ambulatory Visit | Attending: Urology | Admitting: Urology

## 2019-06-17 ENCOUNTER — Other Ambulatory Visit: Payer: Self-pay

## 2019-06-17 ENCOUNTER — Ambulatory Visit
Admission: RE | Admit: 2019-06-17 | Discharge: 2019-06-17 | Disposition: A | Discharge status: Home / Self Care | Payer: Medicare Other | Source: Ambulatory Visit | Attending: Radiation Oncology | Admitting: Radiation Oncology

## 2019-06-17 VITALS — Wt 207.0 lb

## 2019-06-17 VITALS — BP 137/73 | HR 74 | Temp 98.7°F | Resp 18 | Ht 71.0 in | Wt 215.0 lb

## 2019-06-17 DIAGNOSIS — Z7982 Long term (current) use of aspirin: Secondary | ICD-10-CM | POA: Insufficient documentation

## 2019-06-17 DIAGNOSIS — C61 Malignant neoplasm of prostate: Secondary | ICD-10-CM

## 2019-06-17 DIAGNOSIS — Z8042 Family history of malignant neoplasm of prostate: Secondary | ICD-10-CM | POA: Diagnosis not present

## 2019-06-17 DIAGNOSIS — Z800 Family history of malignant neoplasm of digestive organs: Secondary | ICD-10-CM | POA: Insufficient documentation

## 2019-06-17 DIAGNOSIS — F17210 Nicotine dependence, cigarettes, uncomplicated: Secondary | ICD-10-CM | POA: Insufficient documentation

## 2019-06-17 DIAGNOSIS — Z9289 Personal history of other medical treatment: Secondary | ICD-10-CM | POA: Diagnosis not present

## 2019-06-17 NOTE — Progress Notes (Signed)
  Radiation Oncology         (336) [phone] ________________________________  Name: [name] MRN: [id number]  Date: 06/17/2019  DOB: 1951/09/22  SIMULATION AND TREATMENT PLANNING NOTE    ICD-10-CM   1. Malignant neoplasm of prostate ([place])  C61     DIAGNOSIS:  68 y.o. gentleman with biochemical recurrence of prostate cancer with PSA of 0.69  NARRATIVE:  The patient was brought to the [place].  Identity was confirmed.  All relevant records and images related to the planned course of therapy were reviewed.  The patient freely provided informed written consent to proceed with treatment after reviewing the details related to the planned course of therapy. The consent form was witnessed and verified by the simulation staff.  Then, the patient was set-up in a stable reproducible supine position for radiation therapy.  A vacuum lock pillow device was custom fabricated to position his legs in a reproducible immobilized position.  Then, I performed a urethrogram under sterile conditions to identify the prostatic bed.  CT images were obtained.  Surface markings were placed.  The CT images were loaded into the planning software.  Then the prostate bed target, pelvic lymph node target and avoidance structures including the rectum, bladder, bowel and hips were contoured.  Treatment planning then occurred.  The radiation prescription was entered and confirmed.  A total of one complex treatment devices were fabricated. I have requested : Intensity Modulated Radiotherapy (IMRT) is medically necessary for this case for the following reason:  Rectal sparing.Marland Kitchen  PLAN:  The patient will receive 45 Gy in 25 fractions of 1.8 Gy, followed by a boost to the prostate bed to a total dose of 68.4 Gy with 13 additional fractions of 1.8 Gy.   ________________________________  Sheral Apley Tammi Klippel, M.D.

## 2019-06-17 NOTE — Progress Notes (Signed)
Radiation Oncology         (336) [phone] ________________________________  Outpatient Follow Up New Visit  Name: [name] MRN: [id number]  Date: 06/17/2019  DOB: Jun 10, 1951  KP:TWSF, Berneta Sages, MD  Iona Beard, MD   REFERRING PHYSICIAN: Iona Beard, MD  DIAGNOSIS: 68 y.o. gentleman with a biochemical recurrence of prostate cancer with PSA of 0.69.    ICD-10-CM   1. Malignant neoplasm of prostate ([place])  C61     HISTORY OF PRESENT ILLNESS: [name] is a 68 y.o. male with a diagnosis of biochemically recurrent prostate cancer. His prostate cancer was initially diagnosed by Dr. Diona Fanti in January 2011 as stage T1c, Gleason 4+3 with a pre-treatment PSA of 13.3.  He subsequently underwent prostatectomy with Dr. Risa Grill on 03/16/2010.  Final pathology revealed stage pT2c, Gleason 3+4 prostatic adenocarcinoma involving both lobes and the apex.  Surgical margins, seminal vesicles, and two pelvic lymph nodes were negative, and there was no extraprostatic extension or lymphovascular invasion.  Initial postoperative PSA was 0.0.  However, over the next few years his PSA did increase to 0.06-0.07 range and increased to 0.10 in July 2013.  In December 2014, his PSA was up to 0.16. He understood that this was highly suggestive of a biochemical recurrence but elected not to proceed with salvage therapy at that time and chose ongoing surveillance with PSA's as follows:  PSA History: 01/2019: PSA 0.69 01/2018: PSA 0.51- transitioned care to Dr. Lovena Neighbours 07/2017: PSA 0.39 01/2017: PSA 0.35 06/2016: PSA 0.29 12/2015: PSA 0.34 06/2015: PSA 0.30 11/2014: PSA 0.23 05/2014: PSA 0.17 11/2013: PSA 0.16  The patient's care was transitioned to Dr. Lovena Neighbours in 2019 who has continued to follow him closely.  He underwent restaging imaging with a CT C/A/P on 03/05/2019 which did not show any evidence of metastatic disease. A bone scan performed that same day did not show any evidence of osseous  metastatic disease. We met for initial consultation on 03/18/19 to discuss potential salvage radiation to the prostate fossa and the patient elected to proceed with ST-ADT concurrent with radiation.  INTERVAL HISTORY (06/17/19): The patient returns today for follow-up discussion regarding treatment of his biochemically recurrent prostate cancer. Since we saw him last in 02/2019, he began ADT with a 6 month Lupron injection on 04/01/2019. At that time, he wished to postpone start of radiation therapy due to COVID-19 concerns. He is now ready to proceed with salvage radiotherapy to the prostate fossa and is scheduled for CT simulation later today.  He thinks that he had a repeat PSA at the time of his last office visit with Dr. Lovena Neighbours to start ADT in 03/2019 but is unsure of the value.  PREVIOUS RADIATION THERAPY: No  PAST MEDICAL HISTORY:  Past Medical History:  Diagnosis Date  . Prostate cancer ([place])       PAST SURGICAL HISTORY: Past Surgical History:  Procedure Laterality Date  . ROBOT ASSISTED LAPAROSCOPIC RADICAL PROSTATECTOMY    . TONSILLECTOMY      FAMILY HISTORY:  Family History  Problem Relation Age of Onset  . Gastric cancer Mother   . Prostate cancer Father   . Breast cancer Neg Hx   . Pancreatic cancer Neg Hx     SOCIAL HISTORY:  Social History   Socioeconomic History  . Marital status: Single    Spouse name: Not on file  . Number of children: 1  . Years of education: Not on file  . Highest education level: Not on file  Occupational History    Comment: customer service   Social Needs  . Financial resource strain: Not on file  . Food insecurity    Worry: Not on file    Inability: Not on file  . Transportation needs    Medical: Not on file    Non-medical: Not on file  Tobacco Use  . Smoking status: Current Some Day Smoker    Packs/day: 0.25    Years: 21.00    Pack years: 5.25    Types: Cigarettes  . Smokeless tobacco: Never Used  Substance and Sexual Activity   . Alcohol use: Never    Frequency: Never  . Drug use: Never  . Sexual activity: Not Currently  Lifestyle  . Physical activity    Days per week: Not on file    Minutes per session: Not on file  . Stress: Not on file  Relationships  . Social Herbalist on phone: Not on file    Gets together: Not on file    Attends religious service: Not on file    Active member of club or organization: Not on file    Attends meetings of clubs or organizations: Not on file    Relationship status: Not on file  . Intimate partner violence    Fear of current or ex partner: Not on file    Emotionally abused: Not on file    Physically abused: Not on file    Forced sexual activity: Not on file  Other Topics Concern  . Not on file  Social History Narrative   Has one son.     ALLERGIES: Shellfish allergy  MEDICATIONS:  Current Outpatient Medications  Medication Sig Dispense Refill  . aspirin EC 81 MG tablet Take 81 mg by mouth daily.    . Esomeprazole Magnesium (NEXIUM PO) Take by mouth.    Marland Kitchen lisinopril-hydrochlorothiazide (PRINZIDE,ZESTORETIC) 10-12.5 MG tablet Take 1 tablet by mouth daily.    . Multiple Vitamins-Minerals (CENTRUM SILVER PO) Take by mouth.    . oxybutynin (DITROPAN-XL) 10 MG 24 hr tablet Take 10 mg by mouth daily.     No current facility-administered medications for this encounter.     REVIEW OF SYSTEMS:  On review of systems, the patient reports that he is doing well overall. He denies any chest pain, shortness of breath, cough, fevers, chills, night sweats, or unintended weight changes. He denies any bowel disturbances, and denies abdominal pain, nausea or vomiting. He denies any new musculoskeletal or joint aches or pains. His IPSS was 2. He reports occasional stress incontinence requiring 1-2 pads per day if he is at home or up to 6 pads per day if he is working (explains he walks a lot at work). He denies dysuria or hematuria. His SHIM was 3, indicating he does have  severe erectile dysfunction. A complete review of systems is obtained and is otherwise negative.    PHYSICAL EXAM:  Wt Readings from Last 3 Encounters:  06/17/19 215 lb (97.5 kg)  06/17/19 207 lb (93.9 kg)  03/07/17 228 lb (103.4 kg)   Temp Readings from Last 3 Encounters:  06/17/19 98.7 F (37.1 C) (Temporal)   BP Readings from Last 3 Encounters:  06/17/19 137/73  03/07/17 137/86   Pulse Readings from Last 3 Encounters:  06/17/19 74  03/07/17 85   In general this is a well appearing African-American male in no acute distress. He is alert and oriented x4 and appropriate throughout the examination. Cardiopulmonary assessment is negative  for acute distress and he exhibits normal effort.   KPS = 100  100 - Normal; no complaints; no evidence of disease. 90   - Able to carry on normal activity; minor signs or symptoms of disease. 80   - Normal activity with effort; some signs or symptoms of disease. 41   - Cares for self; unable to carry on normal activity or to do active work. 60   - Requires occasional assistance, but is able to care for most of his personal needs. 50   - Requires considerable assistance and frequent medical care. 39   - Disabled; requires special care and assistance. 72   - Severely disabled; hospital admission is indicated although death not imminent. 35   - Very sick; hospital admission necessary; active supportive treatment necessary. 10   - Moribund; fatal processes progressing rapidly. 0     - Dead  Karnofsky DA, Abelmann [place], Craver LS and Burchenal [facility] [phone]) The use of the nitrogen mustards in the palliative treatment of carcinoma: with particular reference to bronchogenic carcinoma Cancer 1 [phone]  LABORATORY DATA:  Lab Results  Component Value Date   WBC 11.2 (H) 03/11/2010   HGB 10.6 (L) 03/17/2010   HCT 31.5 (L) 03/17/2010   MCV 97.8 03/11/2010   PLT 342 03/11/2010   Lab Results  Component Value Date   NA 138 03/18/2010   K 4.4 03/18/2010    CL 105 03/18/2010   CO2 27 03/18/2010   Lab Results  Component Value Date   ALT 16 03/11/2010   AST 18 03/11/2010   ALKPHOS 68 03/11/2010   BILITOT 0.7 03/11/2010     RADIOGRAPHY: No results found.    IMPRESSION/PLAN: 1. 68 y.o. gentleman with biochemical recurrence of prostate cancer with PSA of 0.69.  Today we reviewed the findings and workup thus far. We discussed the natural history of prostate cancer. We reviewed the the implications of rising post-operative PSA indicating prostate cancer recurrence. We reviewed some of the evidence suggesting an advantage for patients who undergo salvage radiotherapy in the setting in terms of disease control and overall survival. We discussed radiation treatment directed to the prostatic fossa with regard to the logistics and delivery of external beam radiation treatment. We also detailed the role of ADT in the treatment of biochemically recurrent prostate cancer and outlined the associated side effects that could be expected with this therapy.  At the end of the conversation the patient is interested in moving forward with ADT in combination with salvage radiotherapy to the prostatic fossa.  He has already started ADT and is ready to proceed with daily radiation.  He has freely signed written consent to proceed today in the office and is scheduled for CT simulation/treatment planning following our visit today in preparation for beginning his treatments in the near future.  We will follow-up with Dr. Lovena Neighbours to inquire about current PSA levels so that we have an accurate baseline prior to starting his treatment.  Our discussion today will be shared with Dr. Lovena Neighbours and we will move forward with treatment planning accordingly.   Nicholos Johns, PA-C    Tyler Pita, MD  [facility] Direct Dial: [phone]  Fax: [phone] [email or web address]  Skype  [email or web address]  This document serves as a record of services personally  performed by Tyler Pita, MD and Freeman Caldron, PA-C. It was created on their behalf by Rae Lips, a trained medical scribe. The creation of this record is based on the  scribe's personal observations and the providers' statements to them. This document has been checked and approved by the attending providers.

## 2019-06-19 ENCOUNTER — Telehealth: Payer: Self-pay | Admitting: *Deleted

## 2019-06-19 ENCOUNTER — Encounter: Payer: Self-pay | Admitting: Urology

## 2019-06-19 ENCOUNTER — Other Ambulatory Visit: Payer: Self-pay | Admitting: Urology

## 2019-06-19 DIAGNOSIS — C61 Malignant neoplasm of prostate: Secondary | ICD-10-CM

## 2019-06-19 NOTE — Telephone Encounter (Signed)
CALLED PATIENT TO ASK ABOUT COMING FOR A LAB ON 06-26-19 @ 2 PM, LVM FOR A RETURN CALL

## 2019-06-19 NOTE — Progress Notes (Signed)
It was confirmed through Dr. Gilford Rile office that Mr. Dickenson has not had a recent PSA since February 2020.  Therefore, I have placed orders to obtain a baseline PSA prior to the start of his radiation treatment on 06/30/2019.  I have sent an inbox message to Romie Jumper requesting that she reach out to the patient to coordinate this lab visit.  Nicholos Johns, MMS, PA-C [facility] [place]: [phone]  Fax: [phone]

## 2019-06-20 ENCOUNTER — Telehealth: Payer: Self-pay | Admitting: *Deleted

## 2019-06-20 NOTE — Telephone Encounter (Signed)
RETURNED PATIENT'S PHONE CALL, SPOKE WITH PATIENT. ?

## 2019-06-24 DIAGNOSIS — C61 Malignant neoplasm of prostate: Secondary | ICD-10-CM | POA: Diagnosis not present

## 2019-06-24 DIAGNOSIS — Z800 Family history of malignant neoplasm of digestive organs: Secondary | ICD-10-CM | POA: Diagnosis not present

## 2019-06-24 DIAGNOSIS — Z7982 Long term (current) use of aspirin: Secondary | ICD-10-CM | POA: Diagnosis not present

## 2019-06-26 ENCOUNTER — Ambulatory Visit: Payer: Medicare Other

## 2019-06-26 ENCOUNTER — Other Ambulatory Visit: Payer: Self-pay

## 2019-06-26 ENCOUNTER — Ambulatory Visit
Admission: RE | Admit: 2019-06-26 | Discharge: 2019-06-26 | Disposition: A | Discharge status: Home / Self Care | Payer: Medicare Other | Source: Ambulatory Visit | Attending: Radiation Oncology | Admitting: Radiation Oncology

## 2019-06-26 DIAGNOSIS — Z800 Family history of malignant neoplasm of digestive organs: Secondary | ICD-10-CM | POA: Diagnosis not present

## 2019-06-26 DIAGNOSIS — C61 Malignant neoplasm of prostate: Secondary | ICD-10-CM | POA: Insufficient documentation

## 2019-06-26 DIAGNOSIS — Z8042 Family history of malignant neoplasm of prostate: Secondary | ICD-10-CM | POA: Diagnosis not present

## 2019-06-26 DIAGNOSIS — F17210 Nicotine dependence, cigarettes, uncomplicated: Secondary | ICD-10-CM | POA: Insufficient documentation

## 2019-06-26 DIAGNOSIS — Z7982 Long term (current) use of aspirin: Secondary | ICD-10-CM | POA: Insufficient documentation

## 2019-06-27 LAB — PROSTATE-SPECIFIC AG, SERUM (LABCORP): Prostate Specific Ag, Serum: <0.1 ng/mL (ref 0.0–4.0)

## 2019-06-30 ENCOUNTER — Ambulatory Visit
Admission: RE | Admit: 2019-06-30 | Discharge: 2019-06-30 | Disposition: A | Discharge status: Home / Self Care | Payer: Medicare Other | Source: Ambulatory Visit | Attending: Radiation Oncology | Admitting: Radiation Oncology

## 2019-06-30 ENCOUNTER — Encounter: Payer: Self-pay | Admitting: Medical Oncology

## 2019-06-30 ENCOUNTER — Other Ambulatory Visit: Payer: Self-pay

## 2019-06-30 DIAGNOSIS — C61 Malignant neoplasm of prostate: Secondary | ICD-10-CM | POA: Diagnosis not present

## 2019-06-30 DIAGNOSIS — Z7982 Long term (current) use of aspirin: Secondary | ICD-10-CM | POA: Diagnosis not present

## 2019-06-30 DIAGNOSIS — Z800 Family history of malignant neoplasm of digestive organs: Secondary | ICD-10-CM | POA: Diagnosis not present

## 2019-07-01 ENCOUNTER — Other Ambulatory Visit: Payer: Self-pay

## 2019-07-01 ENCOUNTER — Ambulatory Visit
Admission: RE | Admit: 2019-07-01 | Discharge: 2019-07-01 | Disposition: A | Discharge status: Home / Self Care | Payer: Medicare Other | Source: Ambulatory Visit | Attending: Radiation Oncology | Admitting: Radiation Oncology

## 2019-07-01 DIAGNOSIS — Z800 Family history of malignant neoplasm of digestive organs: Secondary | ICD-10-CM | POA: Diagnosis not present

## 2019-07-01 DIAGNOSIS — C61 Malignant neoplasm of prostate: Secondary | ICD-10-CM | POA: Diagnosis not present

## 2019-07-01 DIAGNOSIS — Z7982 Long term (current) use of aspirin: Secondary | ICD-10-CM | POA: Diagnosis not present

## 2019-07-02 ENCOUNTER — Ambulatory Visit
Admission: RE | Admit: 2019-07-02 | Discharge: 2019-07-02 | Disposition: A | Discharge status: Home / Self Care | Payer: Medicare Other | Source: Ambulatory Visit | Attending: Radiation Oncology | Admitting: Radiation Oncology

## 2019-07-02 ENCOUNTER — Other Ambulatory Visit: Payer: Self-pay

## 2019-07-02 DIAGNOSIS — C61 Malignant neoplasm of prostate: Secondary | ICD-10-CM | POA: Diagnosis not present

## 2019-07-02 DIAGNOSIS — Z800 Family history of malignant neoplasm of digestive organs: Secondary | ICD-10-CM | POA: Diagnosis not present

## 2019-07-02 DIAGNOSIS — Z7982 Long term (current) use of aspirin: Secondary | ICD-10-CM | POA: Diagnosis not present

## 2019-07-03 ENCOUNTER — Ambulatory Visit
Admission: RE | Admit: 2019-07-03 | Discharge: 2019-07-03 | Disposition: A | Discharge status: Home / Self Care | Payer: Medicare Other | Source: Ambulatory Visit | Attending: Radiation Oncology | Admitting: Radiation Oncology

## 2019-07-03 ENCOUNTER — Other Ambulatory Visit: Payer: Self-pay

## 2019-07-03 DIAGNOSIS — Z7982 Long term (current) use of aspirin: Secondary | ICD-10-CM | POA: Diagnosis not present

## 2019-07-03 DIAGNOSIS — C61 Malignant neoplasm of prostate: Secondary | ICD-10-CM | POA: Diagnosis not present

## 2019-07-03 DIAGNOSIS — Z800 Family history of malignant neoplasm of digestive organs: Secondary | ICD-10-CM | POA: Diagnosis not present

## 2019-07-04 ENCOUNTER — Other Ambulatory Visit: Payer: Self-pay

## 2019-07-04 ENCOUNTER — Ambulatory Visit
Admission: RE | Admit: 2019-07-04 | Discharge: 2019-07-04 | Disposition: A | Discharge status: Home / Self Care | Payer: Medicare Other | Source: Ambulatory Visit | Attending: Radiation Oncology | Admitting: Radiation Oncology

## 2019-07-04 DIAGNOSIS — Z7982 Long term (current) use of aspirin: Secondary | ICD-10-CM | POA: Diagnosis not present

## 2019-07-04 DIAGNOSIS — Z800 Family history of malignant neoplasm of digestive organs: Secondary | ICD-10-CM | POA: Diagnosis not present

## 2019-07-04 DIAGNOSIS — C61 Malignant neoplasm of prostate: Secondary | ICD-10-CM | POA: Diagnosis not present

## 2019-07-07 ENCOUNTER — Encounter: Payer: Self-pay | Admitting: *Deleted

## 2019-07-07 ENCOUNTER — Ambulatory Visit
Admission: RE | Admit: 2019-07-07 | Discharge: 2019-07-07 | Disposition: A | Discharge status: Home / Self Care | Payer: Medicare Other | Source: Ambulatory Visit | Attending: Radiation Oncology | Admitting: Radiation Oncology

## 2019-07-07 ENCOUNTER — Other Ambulatory Visit: Payer: Self-pay

## 2019-07-07 DIAGNOSIS — Z800 Family history of malignant neoplasm of digestive organs: Secondary | ICD-10-CM | POA: Diagnosis not present

## 2019-07-07 DIAGNOSIS — C61 Malignant neoplasm of prostate: Secondary | ICD-10-CM | POA: Diagnosis not present

## 2019-07-07 DIAGNOSIS — Z7982 Long term (current) use of aspirin: Secondary | ICD-10-CM | POA: Diagnosis not present

## 2019-07-08 ENCOUNTER — Other Ambulatory Visit: Payer: Self-pay

## 2019-07-08 ENCOUNTER — Ambulatory Visit
Admission: RE | Admit: 2019-07-08 | Discharge: 2019-07-08 | Disposition: A | Discharge status: Home / Self Care | Payer: Medicare Other | Source: Ambulatory Visit | Attending: Radiation Oncology | Admitting: Radiation Oncology

## 2019-07-08 DIAGNOSIS — Z800 Family history of malignant neoplasm of digestive organs: Secondary | ICD-10-CM | POA: Diagnosis not present

## 2019-07-08 DIAGNOSIS — Z7982 Long term (current) use of aspirin: Secondary | ICD-10-CM | POA: Diagnosis not present

## 2019-07-08 DIAGNOSIS — C61 Malignant neoplasm of prostate: Secondary | ICD-10-CM | POA: Diagnosis not present

## 2019-07-09 ENCOUNTER — Ambulatory Visit
Admission: RE | Admit: 2019-07-09 | Discharge: 2019-07-09 | Disposition: A | Discharge status: Home / Self Care | Payer: Medicare Other | Source: Ambulatory Visit | Attending: Radiation Oncology | Admitting: Radiation Oncology

## 2019-07-09 ENCOUNTER — Other Ambulatory Visit: Payer: Self-pay

## 2019-07-09 DIAGNOSIS — Z7982 Long term (current) use of aspirin: Secondary | ICD-10-CM | POA: Diagnosis not present

## 2019-07-09 DIAGNOSIS — C61 Malignant neoplasm of prostate: Secondary | ICD-10-CM | POA: Diagnosis not present

## 2019-07-09 DIAGNOSIS — Z800 Family history of malignant neoplasm of digestive organs: Secondary | ICD-10-CM | POA: Diagnosis not present

## 2019-07-10 ENCOUNTER — Ambulatory Visit
Admission: RE | Admit: 2019-07-10 | Discharge: 2019-07-10 | Disposition: A | Discharge status: Home / Self Care | Payer: Medicare Other | Source: Ambulatory Visit | Attending: Radiation Oncology | Admitting: Radiation Oncology

## 2019-07-10 ENCOUNTER — Other Ambulatory Visit: Payer: Self-pay

## 2019-07-10 DIAGNOSIS — Z800 Family history of malignant neoplasm of digestive organs: Secondary | ICD-10-CM | POA: Diagnosis not present

## 2019-07-10 DIAGNOSIS — C61 Malignant neoplasm of prostate: Secondary | ICD-10-CM | POA: Diagnosis not present

## 2019-07-10 DIAGNOSIS — Z7982 Long term (current) use of aspirin: Secondary | ICD-10-CM | POA: Diagnosis not present

## 2019-07-11 ENCOUNTER — Ambulatory Visit
Admission: RE | Admit: 2019-07-11 | Discharge: 2019-07-11 | Disposition: A | Discharge status: Home / Self Care | Payer: Medicare Other | Source: Ambulatory Visit | Attending: Radiation Oncology | Admitting: Radiation Oncology

## 2019-07-11 ENCOUNTER — Other Ambulatory Visit: Payer: Self-pay

## 2019-07-11 DIAGNOSIS — Z7982 Long term (current) use of aspirin: Secondary | ICD-10-CM | POA: Diagnosis not present

## 2019-07-11 DIAGNOSIS — Z800 Family history of malignant neoplasm of digestive organs: Secondary | ICD-10-CM | POA: Diagnosis not present

## 2019-07-11 DIAGNOSIS — C61 Malignant neoplasm of prostate: Secondary | ICD-10-CM | POA: Diagnosis not present

## 2019-07-14 ENCOUNTER — Other Ambulatory Visit: Payer: Self-pay

## 2019-07-14 ENCOUNTER — Ambulatory Visit
Admission: RE | Admit: 2019-07-14 | Discharge: 2019-07-14 | Disposition: A | Discharge status: Home / Self Care | Payer: Medicare Other | Source: Ambulatory Visit | Attending: Radiation Oncology | Admitting: Radiation Oncology

## 2019-07-14 DIAGNOSIS — C61 Malignant neoplasm of prostate: Secondary | ICD-10-CM | POA: Diagnosis not present

## 2019-07-14 DIAGNOSIS — Z800 Family history of malignant neoplasm of digestive organs: Secondary | ICD-10-CM | POA: Diagnosis not present

## 2019-07-14 DIAGNOSIS — Z7982 Long term (current) use of aspirin: Secondary | ICD-10-CM | POA: Diagnosis not present

## 2019-07-15 ENCOUNTER — Other Ambulatory Visit: Payer: Self-pay

## 2019-07-15 ENCOUNTER — Ambulatory Visit
Admission: RE | Admit: 2019-07-15 | Discharge: 2019-07-15 | Disposition: A | Discharge status: Home / Self Care | Payer: Medicare Other | Source: Ambulatory Visit | Attending: Radiation Oncology | Admitting: Radiation Oncology

## 2019-07-15 DIAGNOSIS — Z800 Family history of malignant neoplasm of digestive organs: Secondary | ICD-10-CM | POA: Diagnosis not present

## 2019-07-15 DIAGNOSIS — C61 Malignant neoplasm of prostate: Secondary | ICD-10-CM | POA: Diagnosis not present

## 2019-07-15 DIAGNOSIS — Z7982 Long term (current) use of aspirin: Secondary | ICD-10-CM | POA: Diagnosis not present

## 2019-07-16 ENCOUNTER — Other Ambulatory Visit: Payer: Self-pay

## 2019-07-16 ENCOUNTER — Ambulatory Visit
Admission: RE | Admit: 2019-07-16 | Discharge: 2019-07-16 | Disposition: A | Discharge status: Home / Self Care | Payer: Medicare Other | Source: Ambulatory Visit | Attending: Radiation Oncology | Admitting: Radiation Oncology

## 2019-07-16 DIAGNOSIS — Z7982 Long term (current) use of aspirin: Secondary | ICD-10-CM | POA: Diagnosis not present

## 2019-07-16 DIAGNOSIS — Z800 Family history of malignant neoplasm of digestive organs: Secondary | ICD-10-CM | POA: Diagnosis not present

## 2019-07-16 DIAGNOSIS — C61 Malignant neoplasm of prostate: Secondary | ICD-10-CM | POA: Diagnosis not present

## 2019-07-17 ENCOUNTER — Other Ambulatory Visit: Payer: Self-pay

## 2019-07-17 ENCOUNTER — Ambulatory Visit
Admission: RE | Admit: 2019-07-17 | Discharge: 2019-07-17 | Disposition: A | Discharge status: Home / Self Care | Payer: Medicare Other | Source: Ambulatory Visit | Attending: Radiation Oncology | Admitting: Radiation Oncology

## 2019-07-17 DIAGNOSIS — Z800 Family history of malignant neoplasm of digestive organs: Secondary | ICD-10-CM | POA: Diagnosis not present

## 2019-07-17 DIAGNOSIS — C61 Malignant neoplasm of prostate: Secondary | ICD-10-CM | POA: Diagnosis not present

## 2019-07-17 DIAGNOSIS — Z7982 Long term (current) use of aspirin: Secondary | ICD-10-CM | POA: Diagnosis not present

## 2019-07-18 ENCOUNTER — Other Ambulatory Visit: Payer: Self-pay

## 2019-07-18 ENCOUNTER — Ambulatory Visit
Admission: RE | Admit: 2019-07-18 | Discharge: 2019-07-18 | Disposition: A | Discharge status: Home / Self Care | Payer: Medicare Other | Source: Ambulatory Visit | Attending: Radiation Oncology | Admitting: Radiation Oncology

## 2019-07-18 DIAGNOSIS — Z7982 Long term (current) use of aspirin: Secondary | ICD-10-CM | POA: Diagnosis not present

## 2019-07-18 DIAGNOSIS — C61 Malignant neoplasm of prostate: Secondary | ICD-10-CM | POA: Diagnosis not present

## 2019-07-18 DIAGNOSIS — Z800 Family history of malignant neoplasm of digestive organs: Secondary | ICD-10-CM | POA: Diagnosis not present

## 2019-07-21 ENCOUNTER — Other Ambulatory Visit: Payer: Self-pay

## 2019-07-21 ENCOUNTER — Ambulatory Visit
Admission: RE | Admit: 2019-07-21 | Discharge: 2019-07-21 | Disposition: A | Discharge status: Home / Self Care | Payer: Medicare Other | Source: Ambulatory Visit | Attending: Radiation Oncology | Admitting: Radiation Oncology

## 2019-07-21 DIAGNOSIS — C61 Malignant neoplasm of prostate: Secondary | ICD-10-CM | POA: Diagnosis not present

## 2019-07-21 DIAGNOSIS — Z7982 Long term (current) use of aspirin: Secondary | ICD-10-CM | POA: Diagnosis not present

## 2019-07-21 DIAGNOSIS — Z800 Family history of malignant neoplasm of digestive organs: Secondary | ICD-10-CM | POA: Diagnosis not present

## 2019-07-22 ENCOUNTER — Other Ambulatory Visit: Payer: Self-pay

## 2019-07-22 ENCOUNTER — Ambulatory Visit
Admission: RE | Admit: 2019-07-22 | Discharge: 2019-07-22 | Disposition: A | Discharge status: Home / Self Care | Payer: Medicare Other | Source: Ambulatory Visit | Attending: Radiation Oncology | Admitting: Radiation Oncology

## 2019-07-22 DIAGNOSIS — Z800 Family history of malignant neoplasm of digestive organs: Secondary | ICD-10-CM | POA: Diagnosis not present

## 2019-07-22 DIAGNOSIS — C61 Malignant neoplasm of prostate: Secondary | ICD-10-CM | POA: Diagnosis not present

## 2019-07-22 DIAGNOSIS — Z7982 Long term (current) use of aspirin: Secondary | ICD-10-CM | POA: Diagnosis not present

## 2019-07-23 ENCOUNTER — Ambulatory Visit
Admission: RE | Admit: 2019-07-23 | Discharge: 2019-07-23 | Disposition: A | Discharge status: Home / Self Care | Payer: Medicare Other | Source: Ambulatory Visit | Attending: Radiation Oncology | Admitting: Radiation Oncology

## 2019-07-23 ENCOUNTER — Other Ambulatory Visit: Payer: Self-pay

## 2019-07-23 DIAGNOSIS — C61 Malignant neoplasm of prostate: Secondary | ICD-10-CM | POA: Diagnosis not present

## 2019-07-23 DIAGNOSIS — Z800 Family history of malignant neoplasm of digestive organs: Secondary | ICD-10-CM | POA: Diagnosis not present

## 2019-07-23 DIAGNOSIS — Z7982 Long term (current) use of aspirin: Secondary | ICD-10-CM | POA: Diagnosis not present

## 2019-07-24 ENCOUNTER — Ambulatory Visit
Admission: RE | Admit: 2019-07-24 | Discharge: 2019-07-24 | Disposition: A | Discharge status: Home / Self Care | Payer: Medicare Other | Source: Ambulatory Visit | Attending: Radiation Oncology | Admitting: Radiation Oncology

## 2019-07-24 ENCOUNTER — Other Ambulatory Visit: Payer: Self-pay

## 2019-07-24 DIAGNOSIS — C61 Malignant neoplasm of prostate: Secondary | ICD-10-CM | POA: Diagnosis not present

## 2019-07-24 DIAGNOSIS — Z7982 Long term (current) use of aspirin: Secondary | ICD-10-CM | POA: Diagnosis not present

## 2019-07-24 DIAGNOSIS — Z800 Family history of malignant neoplasm of digestive organs: Secondary | ICD-10-CM | POA: Diagnosis not present

## 2019-07-25 ENCOUNTER — Other Ambulatory Visit: Payer: Self-pay

## 2019-07-25 ENCOUNTER — Ambulatory Visit
Admission: RE | Admit: 2019-07-25 | Discharge: 2019-07-25 | Disposition: A | Discharge status: Home / Self Care | Payer: Medicare Other | Source: Ambulatory Visit | Attending: Radiation Oncology | Admitting: Radiation Oncology

## 2019-07-25 DIAGNOSIS — Z7982 Long term (current) use of aspirin: Secondary | ICD-10-CM | POA: Diagnosis not present

## 2019-07-25 DIAGNOSIS — Z800 Family history of malignant neoplasm of digestive organs: Secondary | ICD-10-CM | POA: Diagnosis not present

## 2019-07-25 DIAGNOSIS — C61 Malignant neoplasm of prostate: Secondary | ICD-10-CM | POA: Diagnosis not present

## 2019-07-28 ENCOUNTER — Ambulatory Visit
Admission: RE | Admit: 2019-07-28 | Discharge: 2019-07-28 | Disposition: A | Discharge status: Home / Self Care | Payer: Medicare Other | Source: Ambulatory Visit | Attending: Radiation Oncology | Admitting: Radiation Oncology

## 2019-07-28 ENCOUNTER — Other Ambulatory Visit: Payer: Self-pay

## 2019-07-28 DIAGNOSIS — Z510 Encounter for antineoplastic radiation therapy: Secondary | ICD-10-CM | POA: Diagnosis not present

## 2019-07-28 DIAGNOSIS — Z800 Family history of malignant neoplasm of digestive organs: Secondary | ICD-10-CM | POA: Diagnosis not present

## 2019-07-28 DIAGNOSIS — Z8042 Family history of malignant neoplasm of prostate: Secondary | ICD-10-CM | POA: Insufficient documentation

## 2019-07-28 DIAGNOSIS — Z7982 Long term (current) use of aspirin: Secondary | ICD-10-CM | POA: Insufficient documentation

## 2019-07-28 DIAGNOSIS — F17210 Nicotine dependence, cigarettes, uncomplicated: Secondary | ICD-10-CM | POA: Insufficient documentation

## 2019-07-28 DIAGNOSIS — C61 Malignant neoplasm of prostate: Secondary | ICD-10-CM | POA: Diagnosis present

## 2019-07-29 ENCOUNTER — Other Ambulatory Visit: Payer: Self-pay

## 2019-07-29 ENCOUNTER — Ambulatory Visit
Admission: RE | Admit: 2019-07-29 | Discharge: 2019-07-29 | Disposition: A | Discharge status: Home / Self Care | Payer: Medicare Other | Source: Ambulatory Visit | Attending: Radiation Oncology | Admitting: Radiation Oncology

## 2019-07-29 DIAGNOSIS — Z7982 Long term (current) use of aspirin: Secondary | ICD-10-CM | POA: Diagnosis not present

## 2019-07-29 DIAGNOSIS — Z510 Encounter for antineoplastic radiation therapy: Secondary | ICD-10-CM | POA: Diagnosis not present

## 2019-07-29 DIAGNOSIS — Z800 Family history of malignant neoplasm of digestive organs: Secondary | ICD-10-CM | POA: Diagnosis not present

## 2019-07-29 DIAGNOSIS — C61 Malignant neoplasm of prostate: Secondary | ICD-10-CM | POA: Diagnosis not present

## 2019-07-30 ENCOUNTER — Other Ambulatory Visit: Payer: Self-pay

## 2019-07-30 ENCOUNTER — Ambulatory Visit
Admission: RE | Admit: 2019-07-30 | Discharge: 2019-07-30 | Disposition: A | Discharge status: Home / Self Care | Payer: Medicare Other | Source: Ambulatory Visit | Attending: Radiation Oncology | Admitting: Radiation Oncology

## 2019-07-30 DIAGNOSIS — Z510 Encounter for antineoplastic radiation therapy: Secondary | ICD-10-CM | POA: Diagnosis not present

## 2019-07-30 DIAGNOSIS — C61 Malignant neoplasm of prostate: Secondary | ICD-10-CM | POA: Diagnosis not present

## 2019-07-30 DIAGNOSIS — Z7982 Long term (current) use of aspirin: Secondary | ICD-10-CM | POA: Diagnosis not present

## 2019-07-30 DIAGNOSIS — Z800 Family history of malignant neoplasm of digestive organs: Secondary | ICD-10-CM | POA: Diagnosis not present

## 2019-07-31 ENCOUNTER — Ambulatory Visit
Admission: RE | Admit: 2019-07-31 | Discharge: 2019-07-31 | Disposition: A | Discharge status: Home / Self Care | Payer: Medicare Other | Source: Ambulatory Visit | Attending: Radiation Oncology | Admitting: Radiation Oncology

## 2019-07-31 ENCOUNTER — Other Ambulatory Visit: Payer: Self-pay

## 2019-07-31 DIAGNOSIS — Z510 Encounter for antineoplastic radiation therapy: Secondary | ICD-10-CM | POA: Diagnosis not present

## 2019-07-31 DIAGNOSIS — Z800 Family history of malignant neoplasm of digestive organs: Secondary | ICD-10-CM | POA: Diagnosis not present

## 2019-07-31 DIAGNOSIS — Z7982 Long term (current) use of aspirin: Secondary | ICD-10-CM | POA: Diagnosis not present

## 2019-07-31 DIAGNOSIS — C61 Malignant neoplasm of prostate: Secondary | ICD-10-CM | POA: Diagnosis not present

## 2019-08-01 ENCOUNTER — Other Ambulatory Visit: Payer: Self-pay

## 2019-08-01 ENCOUNTER — Ambulatory Visit
Admission: RE | Admit: 2019-08-01 | Discharge: 2019-08-01 | Disposition: A | Discharge status: Home / Self Care | Payer: Medicare Other | Source: Ambulatory Visit | Attending: Radiation Oncology | Admitting: Radiation Oncology

## 2019-08-01 DIAGNOSIS — Z7982 Long term (current) use of aspirin: Secondary | ICD-10-CM | POA: Diagnosis not present

## 2019-08-01 DIAGNOSIS — Z510 Encounter for antineoplastic radiation therapy: Secondary | ICD-10-CM | POA: Diagnosis not present

## 2019-08-01 DIAGNOSIS — C61 Malignant neoplasm of prostate: Secondary | ICD-10-CM | POA: Diagnosis not present

## 2019-08-01 DIAGNOSIS — Z800 Family history of malignant neoplasm of digestive organs: Secondary | ICD-10-CM | POA: Diagnosis not present

## 2019-08-04 ENCOUNTER — Other Ambulatory Visit: Payer: Self-pay

## 2019-08-04 ENCOUNTER — Ambulatory Visit
Admission: RE | Admit: 2019-08-04 | Discharge: 2019-08-04 | Disposition: A | Discharge status: Home / Self Care | Payer: Medicare Other | Source: Ambulatory Visit | Attending: Radiation Oncology | Admitting: Radiation Oncology

## 2019-08-04 DIAGNOSIS — Z800 Family history of malignant neoplasm of digestive organs: Secondary | ICD-10-CM | POA: Diagnosis not present

## 2019-08-04 DIAGNOSIS — C61 Malignant neoplasm of prostate: Secondary | ICD-10-CM | POA: Diagnosis not present

## 2019-08-04 DIAGNOSIS — Z510 Encounter for antineoplastic radiation therapy: Secondary | ICD-10-CM | POA: Diagnosis not present

## 2019-08-04 DIAGNOSIS — Z7982 Long term (current) use of aspirin: Secondary | ICD-10-CM | POA: Diagnosis not present

## 2019-08-05 ENCOUNTER — Ambulatory Visit
Admission: RE | Admit: 2019-08-05 | Discharge: 2019-08-05 | Disposition: A | Discharge status: Home / Self Care | Payer: Medicare Other | Source: Ambulatory Visit | Attending: Radiation Oncology | Admitting: Radiation Oncology

## 2019-08-05 DIAGNOSIS — C61 Malignant neoplasm of prostate: Secondary | ICD-10-CM | POA: Diagnosis not present

## 2019-08-05 DIAGNOSIS — Z7982 Long term (current) use of aspirin: Secondary | ICD-10-CM | POA: Diagnosis not present

## 2019-08-05 DIAGNOSIS — Z510 Encounter for antineoplastic radiation therapy: Secondary | ICD-10-CM | POA: Diagnosis not present

## 2019-08-05 DIAGNOSIS — Z800 Family history of malignant neoplasm of digestive organs: Secondary | ICD-10-CM | POA: Diagnosis not present

## 2019-08-06 ENCOUNTER — Other Ambulatory Visit: Payer: Self-pay

## 2019-08-06 ENCOUNTER — Ambulatory Visit
Admission: RE | Admit: 2019-08-06 | Discharge: 2019-08-06 | Disposition: A | Discharge status: Home / Self Care | Payer: Medicare Other | Source: Ambulatory Visit | Attending: Radiation Oncology | Admitting: Radiation Oncology

## 2019-08-06 DIAGNOSIS — Z7982 Long term (current) use of aspirin: Secondary | ICD-10-CM | POA: Diagnosis not present

## 2019-08-06 DIAGNOSIS — Z800 Family history of malignant neoplasm of digestive organs: Secondary | ICD-10-CM | POA: Diagnosis not present

## 2019-08-06 DIAGNOSIS — Z510 Encounter for antineoplastic radiation therapy: Secondary | ICD-10-CM | POA: Diagnosis not present

## 2019-08-06 DIAGNOSIS — C61 Malignant neoplasm of prostate: Secondary | ICD-10-CM | POA: Diagnosis not present

## 2019-08-07 ENCOUNTER — Other Ambulatory Visit: Payer: Self-pay

## 2019-08-07 ENCOUNTER — Ambulatory Visit
Admission: RE | Admit: 2019-08-07 | Discharge: 2019-08-07 | Disposition: A | Discharge status: Home / Self Care | Payer: Medicare Other | Source: Ambulatory Visit | Attending: Radiation Oncology | Admitting: Radiation Oncology

## 2019-08-07 DIAGNOSIS — C61 Malignant neoplasm of prostate: Secondary | ICD-10-CM | POA: Diagnosis not present

## 2019-08-07 DIAGNOSIS — Z7982 Long term (current) use of aspirin: Secondary | ICD-10-CM | POA: Diagnosis not present

## 2019-08-07 DIAGNOSIS — Z800 Family history of malignant neoplasm of digestive organs: Secondary | ICD-10-CM | POA: Diagnosis not present

## 2019-08-07 DIAGNOSIS — Z510 Encounter for antineoplastic radiation therapy: Secondary | ICD-10-CM | POA: Diagnosis not present

## 2019-08-08 ENCOUNTER — Other Ambulatory Visit: Payer: Self-pay

## 2019-08-08 ENCOUNTER — Ambulatory Visit
Admission: RE | Admit: 2019-08-08 | Discharge: 2019-08-08 | Disposition: A | Discharge status: Home / Self Care | Payer: Medicare Other | Source: Ambulatory Visit | Attending: Radiation Oncology | Admitting: Radiation Oncology

## 2019-08-08 DIAGNOSIS — C61 Malignant neoplasm of prostate: Secondary | ICD-10-CM | POA: Diagnosis not present

## 2019-08-08 DIAGNOSIS — Z800 Family history of malignant neoplasm of digestive organs: Secondary | ICD-10-CM | POA: Diagnosis not present

## 2019-08-08 DIAGNOSIS — Z7982 Long term (current) use of aspirin: Secondary | ICD-10-CM | POA: Diagnosis not present

## 2019-08-08 DIAGNOSIS — Z510 Encounter for antineoplastic radiation therapy: Secondary | ICD-10-CM | POA: Diagnosis not present

## 2019-08-11 ENCOUNTER — Ambulatory Visit
Admission: RE | Admit: 2019-08-11 | Discharge: 2019-08-11 | Disposition: A | Discharge status: Home / Self Care | Payer: Medicare Other | Source: Ambulatory Visit | Attending: Radiation Oncology | Admitting: Radiation Oncology

## 2019-08-11 ENCOUNTER — Other Ambulatory Visit: Payer: Self-pay

## 2019-08-11 DIAGNOSIS — Z800 Family history of malignant neoplasm of digestive organs: Secondary | ICD-10-CM | POA: Diagnosis not present

## 2019-08-11 DIAGNOSIS — Z7982 Long term (current) use of aspirin: Secondary | ICD-10-CM | POA: Diagnosis not present

## 2019-08-11 DIAGNOSIS — C61 Malignant neoplasm of prostate: Secondary | ICD-10-CM | POA: Diagnosis not present

## 2019-08-11 DIAGNOSIS — Z510 Encounter for antineoplastic radiation therapy: Secondary | ICD-10-CM | POA: Diagnosis not present

## 2019-08-12 ENCOUNTER — Ambulatory Visit
Admission: RE | Admit: 2019-08-12 | Discharge: 2019-08-12 | Disposition: A | Discharge status: Home / Self Care | Payer: Medicare Other | Source: Ambulatory Visit | Attending: Radiation Oncology | Admitting: Radiation Oncology

## 2019-08-12 ENCOUNTER — Other Ambulatory Visit: Payer: Self-pay

## 2019-08-12 DIAGNOSIS — Z800 Family history of malignant neoplasm of digestive organs: Secondary | ICD-10-CM | POA: Diagnosis not present

## 2019-08-12 DIAGNOSIS — C61 Malignant neoplasm of prostate: Secondary | ICD-10-CM | POA: Diagnosis not present

## 2019-08-12 DIAGNOSIS — Z510 Encounter for antineoplastic radiation therapy: Secondary | ICD-10-CM | POA: Diagnosis not present

## 2019-08-12 DIAGNOSIS — Z7982 Long term (current) use of aspirin: Secondary | ICD-10-CM | POA: Diagnosis not present

## 2019-08-12 DIAGNOSIS — K429 Umbilical hernia without obstruction or gangrene: Secondary | ICD-10-CM | POA: Diagnosis not present

## 2019-08-12 DIAGNOSIS — I10 Essential (primary) hypertension: Secondary | ICD-10-CM | POA: Diagnosis not present

## 2019-08-12 DIAGNOSIS — Z7189 Other specified counseling: Secondary | ICD-10-CM | POA: Diagnosis not present

## 2019-08-13 ENCOUNTER — Other Ambulatory Visit: Payer: Self-pay

## 2019-08-13 ENCOUNTER — Ambulatory Visit
Admission: RE | Admit: 2019-08-13 | Discharge: 2019-08-13 | Disposition: A | Discharge status: Home / Self Care | Payer: Medicare Other | Source: Ambulatory Visit | Attending: Radiation Oncology | Admitting: Radiation Oncology

## 2019-08-13 DIAGNOSIS — Z510 Encounter for antineoplastic radiation therapy: Secondary | ICD-10-CM | POA: Diagnosis not present

## 2019-08-13 DIAGNOSIS — Z7982 Long term (current) use of aspirin: Secondary | ICD-10-CM | POA: Diagnosis not present

## 2019-08-13 DIAGNOSIS — Z800 Family history of malignant neoplasm of digestive organs: Secondary | ICD-10-CM | POA: Diagnosis not present

## 2019-08-13 DIAGNOSIS — C61 Malignant neoplasm of prostate: Secondary | ICD-10-CM | POA: Diagnosis not present

## 2019-08-14 ENCOUNTER — Ambulatory Visit
Admission: RE | Admit: 2019-08-14 | Discharge: 2019-08-14 | Disposition: A | Discharge status: Home / Self Care | Payer: Medicare Other | Source: Ambulatory Visit | Attending: Radiation Oncology | Admitting: Radiation Oncology

## 2019-08-14 ENCOUNTER — Other Ambulatory Visit: Payer: Self-pay

## 2019-08-14 DIAGNOSIS — Z510 Encounter for antineoplastic radiation therapy: Secondary | ICD-10-CM | POA: Diagnosis not present

## 2019-08-14 DIAGNOSIS — Z800 Family history of malignant neoplasm of digestive organs: Secondary | ICD-10-CM | POA: Diagnosis not present

## 2019-08-14 DIAGNOSIS — C61 Malignant neoplasm of prostate: Secondary | ICD-10-CM | POA: Diagnosis not present

## 2019-08-14 DIAGNOSIS — Z7982 Long term (current) use of aspirin: Secondary | ICD-10-CM | POA: Diagnosis not present

## 2019-08-15 ENCOUNTER — Other Ambulatory Visit: Payer: Self-pay

## 2019-08-15 ENCOUNTER — Ambulatory Visit
Admission: RE | Admit: 2019-08-15 | Discharge: 2019-08-15 | Disposition: A | Discharge status: Home / Self Care | Payer: Medicare Other | Source: Ambulatory Visit | Attending: Radiation Oncology | Admitting: Radiation Oncology

## 2019-08-15 DIAGNOSIS — Z800 Family history of malignant neoplasm of digestive organs: Secondary | ICD-10-CM | POA: Diagnosis not present

## 2019-08-15 DIAGNOSIS — Z510 Encounter for antineoplastic radiation therapy: Secondary | ICD-10-CM | POA: Diagnosis not present

## 2019-08-15 DIAGNOSIS — C61 Malignant neoplasm of prostate: Secondary | ICD-10-CM | POA: Diagnosis not present

## 2019-08-15 DIAGNOSIS — Z7982 Long term (current) use of aspirin: Secondary | ICD-10-CM | POA: Diagnosis not present

## 2019-08-18 ENCOUNTER — Ambulatory Visit
Admission: RE | Admit: 2019-08-18 | Discharge: 2019-08-18 | Disposition: A | Discharge status: Home / Self Care | Payer: Medicare Other | Source: Ambulatory Visit | Attending: Radiation Oncology | Admitting: Radiation Oncology

## 2019-08-18 ENCOUNTER — Other Ambulatory Visit: Payer: Self-pay

## 2019-08-18 DIAGNOSIS — Z7982 Long term (current) use of aspirin: Secondary | ICD-10-CM | POA: Diagnosis not present

## 2019-08-18 DIAGNOSIS — Z800 Family history of malignant neoplasm of digestive organs: Secondary | ICD-10-CM | POA: Diagnosis not present

## 2019-08-18 DIAGNOSIS — Z510 Encounter for antineoplastic radiation therapy: Secondary | ICD-10-CM | POA: Diagnosis not present

## 2019-08-18 DIAGNOSIS — C61 Malignant neoplasm of prostate: Secondary | ICD-10-CM | POA: Diagnosis not present

## 2019-08-19 ENCOUNTER — Ambulatory Visit
Admission: RE | Admit: 2019-08-19 | Discharge: 2019-08-19 | Disposition: A | Discharge status: Home / Self Care | Payer: Medicare Other | Source: Ambulatory Visit | Attending: Radiation Oncology | Admitting: Radiation Oncology

## 2019-08-19 ENCOUNTER — Other Ambulatory Visit: Payer: Self-pay

## 2019-08-19 DIAGNOSIS — Z800 Family history of malignant neoplasm of digestive organs: Secondary | ICD-10-CM | POA: Diagnosis not present

## 2019-08-19 DIAGNOSIS — Z510 Encounter for antineoplastic radiation therapy: Secondary | ICD-10-CM | POA: Diagnosis not present

## 2019-08-19 DIAGNOSIS — Z7982 Long term (current) use of aspirin: Secondary | ICD-10-CM | POA: Diagnosis not present

## 2019-08-19 DIAGNOSIS — C61 Malignant neoplasm of prostate: Secondary | ICD-10-CM | POA: Diagnosis not present

## 2019-08-20 ENCOUNTER — Ambulatory Visit
Admission: RE | Admit: 2019-08-20 | Discharge: 2019-08-20 | Disposition: A | Discharge status: Home / Self Care | Payer: Medicare Other | Source: Ambulatory Visit | Attending: Radiation Oncology | Admitting: Radiation Oncology

## 2019-08-20 ENCOUNTER — Encounter: Payer: Self-pay | Admitting: Radiation Oncology

## 2019-08-20 ENCOUNTER — Other Ambulatory Visit: Payer: Self-pay

## 2019-08-20 DIAGNOSIS — Z510 Encounter for antineoplastic radiation therapy: Secondary | ICD-10-CM | POA: Diagnosis not present

## 2019-08-20 DIAGNOSIS — Z7982 Long term (current) use of aspirin: Secondary | ICD-10-CM | POA: Diagnosis not present

## 2019-08-20 DIAGNOSIS — C61 Malignant neoplasm of prostate: Secondary | ICD-10-CM | POA: Diagnosis not present

## 2019-08-20 DIAGNOSIS — Z800 Family history of malignant neoplasm of digestive organs: Secondary | ICD-10-CM | POA: Diagnosis not present

## 2019-08-21 NOTE — Progress Notes (Signed)
  Radiation Oncology         [phone]) [phone] ________________________________  Name: [name] MRN: [id number]  Date: 08/20/2019  DOB: February 21, 1951  End of Treatment Note  Diagnosis:   68 y.o. gentleman with biochemical recurrence of prostate cancer with PSA of 0.69     Indication for treatment:  Curative, Definitive Radiotherapy       Radiation treatment dates:   06/30/19-826/20  Site/dose:  1. The prostate fossa and pelvic lymph nodes were initially treated to 45 Gy in 25 fractions of 1.8 Gy  2. The prostate fossa only was boosted to 68.4 Gy with 13 additional fractions of 1.8 Gy   Beams/energy:  1. The prostate fossa  and pelvic lymph nodes were initially treated using VMAT intensity modulated radiotherapy delivering 6 megavolt photons. Image guidance was performed with CB-CT studies prior to each fraction. He was immobilized with a body fix lower extremity mold.  2. The prostate fossa only was boosted using VMAT intensity modulated radiotherapy delivering 6 megavolt photons. Image guidance was performed with CB-CT studies prior to each fraction. He was immobilized with a body fix lower extremity mold.  Narrative: The patient tolerated radiation treatment relatively well.   The patient experienced some minor urinary irritation and modest fatigue.  At times, his systolic bp was slightly elevated. Reported intermittent penile pain. Reported taking oxybutynin and AZO as directed. Denied hematuria. Reported nocturia x 1. Reported a moderate urine stream. Reported intermittent urinary hesitancy. Denied any bowel complaints. Reported mild fatigue. Patient with history of kidney stones. Patient evaluated by Dr. Lovena Neighbours and prescribed tamsulosin in an attempt to aid him in passing the kidney stone. Patient planned to start taking the tamsulosin upon completion of radiation.  Plan: The patient has completed radiation treatment. He will return to radiation oncology clinic for routine followup in  one month. Patient scheduled for a one month follow up here on 9/30 at 2p and understands this will be via telephone. Also, patient scheduled to follow up with Dr. Lovena Neighbours again in October.  I advised him to call or return sooner if he has any questions or concerns related to his recovery or treatment. ________________________________  Sheral Apley. Tammi Klippel, M.D.

## 2019-09-24 ENCOUNTER — Ambulatory Visit
Admission: RE | Admit: 2019-09-24 | Discharge: 2019-09-24 | Disposition: A | Discharge status: Home / Self Care | Payer: Medicare Other | Source: Ambulatory Visit | Attending: Urology | Admitting: Urology

## 2019-09-24 ENCOUNTER — Other Ambulatory Visit: Payer: Self-pay

## 2019-09-24 DIAGNOSIS — C61 Malignant neoplasm of prostate: Secondary | ICD-10-CM

## 2019-09-24 NOTE — Progress Notes (Signed)
Radiation Oncology         [phone]) [phone] ________________________________  Name: JOBIN LEBERT MRN: [id number]  Date: 09/24/2019  DOB: 10/11/1951  Post Treatment Note  CC: Iona Beard, MD  Iona Beard, MD  Diagnosis:   68 y.o. gentleman with biochemical recurrence of prostate cancer with PSA of 0.69     Interval Since Last Radiation:  4 weeks; concurrent with ADT 06/30/19-826/20: 1. The prostate fossa and pelvic lymph nodes were initially treated to 45 Gy in 25 fractions of 1.8 Gy  2. The prostate fossa only was boosted to 68.4 Gy with 13 additional fractions of 1.8 Gy    Narrative:  I spoke with the patient to conduct his routine scheduled 1 month follow up visit via telephone to spare the patient unnecessary potential exposure in the healthcare setting during the current COVID-19 pandemic.  The patient was notified in advance and gave permission to proceed with this visit format. He tolerated radiation treatment relatively well.   The patient experienced some minor urinary irritation and modest fatigue.  At times, his systolic bp was slightly elevated. He reported intermittent penile pain with passage of a small kidney stone during his course of treatment. He continued taking oxybutynin and AZO as well as Flomax as directed. He denied hematuria and reported nocturia x 1 with a moderate urine stream with occasional intermittency/hesitancy. He denied any abdominal pain or bowel complaints. He continued to tolerate the ADT throughout treatment with occasional hot flashes and fatigue.                           On review of systems, the patient states that he is doing very well overall. He reports almost complete resolution of LUTS and is feeling back to his baseline regarding nocturia, frequency and urgency.  He continues with incontinence which is unchanged since the time of his surgery. He denies gross hematuria, dysuria, flank pain, fever or chills. He has some mild constipation and is  taking stool softeners prn with relief. He denies abdominal pain, N/V/D. Overall, he is quite pleased with his progress to date.  ALLERGIES:  is allergic to shellfish allergy.  Meds: Current Outpatient Medications  Medication Sig Dispense Refill   aspirin EC 81 MG tablet Take 81 mg by mouth daily.     Esomeprazole Magnesium (NEXIUM PO) Take by mouth.     lisinopril-hydrochlorothiazide (PRINZIDE,ZESTORETIC) 10-12.5 MG tablet Take 1 tablet by mouth daily.     Multiple Vitamins-Minerals (CENTRUM SILVER PO) Take by mouth.     oxybutynin (DITROPAN-XL) 10 MG 24 hr tablet Take 10 mg by mouth daily.     No current facility-administered medications for this encounter.     Physical Findings:  vitals were not taken for this visit.   /Unable to assess due to telephone follow up visit format.  Lab Findings: Lab Results  Component Value Date   WBC 11.2 (H) 03/11/2010   HGB 10.6 (L) 03/17/2010   HCT 31.5 (L) 03/17/2010   MCV 97.8 03/11/2010   PLT 342 03/11/2010     Radiographic Findings: No results found.  Impression/Plan: 1. 68 y.o. gentleman with biochemical recurrence of prostate cancer with PSA of 0.69.    He will continue to follow up with urology for ongoing PSA determinations and has an appointment scheduled with Dr. Lovena Neighbours on 10/04/19. He understands what to expect with regards to PSA monitoring going forward. I will look forward to following his response to  treatment via correspondence with urology, and would be happy to continue to participate in his care if clinically indicated. I talked to the patient about what to expect in the future, including his risk for erectile dysfunction and rectal bleeding. I encouraged him to call or return to the office if he has any questions regarding his previous radiation or possible radiation side effects. He was comfortable with this plan and will follow up as needed.    Nicholos Johns, PA-C

## 2019-12-15 DIAGNOSIS — Z0000 Encounter for general adult medical examination without abnormal findings: Secondary | ICD-10-CM | POA: Diagnosis not present

## 2019-12-15 DIAGNOSIS — K219 Gastro-esophageal reflux disease without esophagitis: Secondary | ICD-10-CM | POA: Diagnosis not present

## 2019-12-15 DIAGNOSIS — I10 Essential (primary) hypertension: Secondary | ICD-10-CM | POA: Diagnosis not present

## 2020-01-14 ENCOUNTER — Other Ambulatory Visit: Payer: Self-pay

## 2020-01-14 ENCOUNTER — Ambulatory Visit: Payer: Medicare Other | Attending: Internal Medicine

## 2020-01-14 DIAGNOSIS — Z23 Encounter for immunization: Secondary | ICD-10-CM | POA: Insufficient documentation

## 2020-01-14 NOTE — Progress Notes (Signed)
   Covid-19 Vaccination Clinic  Name:  [name]    MRN: [id number] DOB: 1951/11/26  01/14/2020  [name] was observed post Covid-19 immunization for 15 minutes without incidence. He was provided with Vaccine Information Sheet and instruction to access the V-Safe system.   [name] was instructed to call 911 with any severe reactions post vaccine: Marland Kitchen Difficulty breathing  . Swelling of your face and throat  . A fast heartbeat  . A bad rash all over your body  . Dizziness and weakness    Immunizations Administered    Name Date Dose VIS Date Route   Pfizer COVID-19 Vaccine 01/14/2020  2:17 PM 0.3 mL 12/05/2019 Intramuscular   Manufacturer: [place]   Lot: [id number]   [place]: [id number]

## 2020-01-19 ENCOUNTER — Encounter: Payer: Self-pay | Admitting: *Deleted

## 2020-01-20 IMAGING — NM NUCLEAR MEDICINE WHOLE BODY BONE SCINTIGRAPHY
2 series · 2 of 2 positions shown · non-contrast
Comparison: 01/07/2010

CLINICAL DATA: 68-year-old male for restaging of prostate cancer.
PSA level of 0.69 on 02/20/2019

EXAM:
NUCLEAR MEDICINE WHOLE BODY BONE SCAN
TECHNIQUE: Whole body anterior and posterior images were obtained approximately
3 hours after intravenous injection of radiopharmaceutical.
RADIOPHARMACEUTICALS:  21.2 mCi Uechnetium-ZZm MDP IV

[Series 1: wbr_bone_40 whole body · 2.66mm/px · 1 of 1 slices shown (1 of 2)]
[im 1/1]
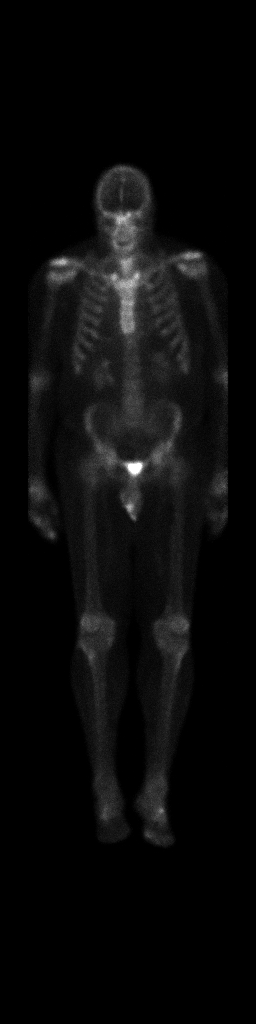

[Series 1: wbr_bone_40 whole body · 2.66mm/px · 1 of 1 slices shown (2 of 2)]
[im 1/1]
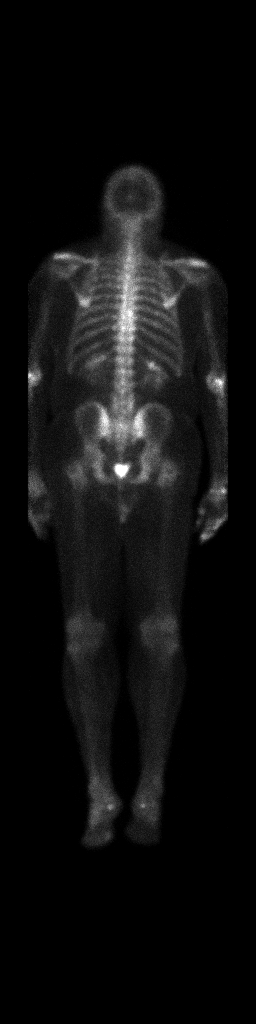

[2 of 2 positions shown; findings below may reference images not displayed]

FINDINGS: No abnormal areas of bony activity noted to suggest osseous
metastatic disease.

No significant abnormalities identified.
IMPRESSION: No evidence of osseous metastatic disease.

## 2020-01-24 ENCOUNTER — Ambulatory Visit: Payer: Medicare Other

## 2020-02-04 ENCOUNTER — Ambulatory Visit: Payer: Medicare Other | Attending: Internal Medicine

## 2020-02-04 ENCOUNTER — Ambulatory Visit: Payer: Medicare Other

## 2020-02-04 DIAGNOSIS — Z23 Encounter for immunization: Secondary | ICD-10-CM

## 2020-02-04 NOTE — Progress Notes (Signed)
   Covid-19 Vaccination Clinic  Name:  [name]    MRN: [id number] DOB: 06/20/1951  02/04/2020  [name] was observed post Covid-19 immunization for 15 minutes without incidence. He was provided with Vaccine Information Sheet and instruction to access the V-Safe system.   [name] was instructed to call 911 with any severe reactions post vaccine: Marland Kitchen Difficulty breathing  . Swelling of your face and throat  . A fast heartbeat  . A bad rash all over your body  . Dizziness and weakness    Immunizations Administered    Name Date Dose VIS Date Route   Pfizer COVID-19 Vaccine 02/04/2020  1:32 PM 0.3 mL 12/05/2019 Intramuscular   Manufacturer: [place]   Lot: [id number]   [place]: [id number]

## 2020-03-31 ENCOUNTER — Other Ambulatory Visit: Payer: Self-pay

## 2020-03-31 ENCOUNTER — Encounter: Payer: Self-pay | Admitting: Orthopedic Surgery

## 2020-03-31 ENCOUNTER — Ambulatory Visit (INDEPENDENT_AMBULATORY_CARE_PROVIDER_SITE_OTHER): Payer: Medicare Other | Admitting: Orthopedic Surgery

## 2020-03-31 VITALS — BP 130/69 | HR 74 | Ht 72.0 in | Wt 215.0 lb

## 2020-03-31 DIAGNOSIS — M65332 Trigger finger, left middle finger: Secondary | ICD-10-CM

## 2020-03-31 DIAGNOSIS — M7731 Calcaneal spur, right foot: Secondary | ICD-10-CM

## 2020-03-31 DIAGNOSIS — M65312 Trigger thumb, left thumb: Secondary | ICD-10-CM | POA: Diagnosis not present

## 2020-03-31 DIAGNOSIS — M65311 Trigger thumb, right thumb: Secondary | ICD-10-CM

## 2020-03-31 NOTE — Progress Notes (Signed)
[name]  03/31/2020  Body mass index is 29.16 kg/m.   HISTORY SECTION :  Chief Complaint  Patient presents with  . Hand Problem    left thumb/ middle fingers    69 year old male previous right long finger triggering treated with injection did well presents with triggering left thumb and left long finger for over a month presents for evaluation and treatment with pain over the A1 pulley   Review of Systems  Cardiovascular: Positive for claudication.  All other systems reviewed and are negative.    has a past medical history of Prostate cancer ([employer]).   Past Surgical History:  Procedure Laterality Date  . ROBOT ASSISTED LAPAROSCOPIC RADICAL PROSTATECTOMY    . TONSILLECTOMY      Body mass index is 29.16 kg/m.   Allergies  Allergen Reactions  . Shellfish Allergy      Current Outpatient Medications:  .  aspirin EC 81 MG tablet, Take 81 mg by mouth daily., Disp: , Rfl:  .  atorvastatin (LIPITOR) 10 MG tablet, Take 10 mg by mouth daily., Disp: , Rfl:  .  Esomeprazole Magnesium (NEXIUM PO), Take by mouth., Disp: , Rfl:  .  lisinopril-hydrochlorothiazide (PRINZIDE,ZESTORETIC) 10-12.5 MG tablet, Take 1 tablet by mouth daily., Disp: , Rfl:  .  Multiple Vitamins-Minerals (CENTRUM SILVER PO), Take by mouth., Disp: , Rfl:  .  oxybutynin (DITROPAN-XL) 10 MG 24 hr tablet, Take 10 mg by mouth daily., Disp: , Rfl:    PHYSICAL EXAM SECTION: 1) BP 130/69   Pulse 74   Ht 6' (1.829 m)   Wt 215 lb (97.5 kg)   BMI 29.16 kg/m   Body mass index is 29.16 kg/m. General appearance: Well-developed well-nourished no gross deformities  2) Cardiovascular normal pulse and perfusion , normal color   3) Neurologically deep tendon reflexes are equal and normal, no sensation loss or deficits no pathologic reflexes  4) Psychological: Awake alert and oriented x3 mood and affect normal  5) Skin no lacerations or ulcerations no nodularity no palpable masses, no erythema or  nodularity  6) Musculoskeletal:   Left thumb and left long finger both have tenderness over the A1 pulley with some fullness and they have decreased range of motion but intact flexor extensor tendons neurovascular exam is intact   MEDICAL DECISION MAKING  A.  Encounter Diagnoses  Name Primary?  . Trigger finger, left middle finger Yes  . Trigger thumb of right hand   . Heel spur, right     B. DATA ANALYSED:  IMAGING: Independent interpretation of images: No x-rays Orders: No new orders Outside records reviewed: No C. MANAGEMENT   Inject left thumb and left long or middle finger  Trigger finger injection  Diagnosis left long finger Procedure injection A1 pulley Medications lidocaine 1% 1 mL and Depo-Medrol 40 mg 1 mL Skin prep alcohol and ethyl chloride Verbal consent was obtained Timeout confirmed the injection site  After cleaning the skin with alcohol and anesthetizing the skin with ethyl chloride the A1 pulley was palpated and the injection was performed without complication   Left Trigger thumb injection Medication  1 mL of 40 mg Depo-Medrol  2 mL of 1% lidocaine plain  Ethyl chloride for anesthesia  Verbal consent was obtained timeout was taken to confirm the injection site as left thumb  Alcohol was used to prepare the skin along with ethyl chloride and then the injection was made at the A1 pulley there were no complications   No orders of  the defined types were placed in this encounter.     Arther Abbott, MD  03/31/2020 8:58 AM

## 2020-04-08 DIAGNOSIS — N210 Calculus in bladder: Secondary | ICD-10-CM | POA: Diagnosis not present

## 2020-04-19 DIAGNOSIS — E785 Hyperlipidemia, unspecified: Secondary | ICD-10-CM | POA: Diagnosis not present

## 2020-04-19 DIAGNOSIS — I10 Essential (primary) hypertension: Secondary | ICD-10-CM | POA: Diagnosis not present

## 2020-04-19 DIAGNOSIS — K429 Umbilical hernia without obstruction or gangrene: Secondary | ICD-10-CM | POA: Diagnosis not present

## 2020-04-19 DIAGNOSIS — Z7189 Other specified counseling: Secondary | ICD-10-CM | POA: Diagnosis not present

## 2020-05-05 ENCOUNTER — Encounter: Payer: Self-pay | Admitting: *Deleted

## 2020-07-28 ENCOUNTER — Ambulatory Visit (INDEPENDENT_AMBULATORY_CARE_PROVIDER_SITE_OTHER): Payer: Self-pay | Admitting: *Deleted

## 2020-07-28 ENCOUNTER — Other Ambulatory Visit: Payer: Self-pay

## 2020-07-28 VITALS — Ht 71.0 in | Wt 224.4 lb

## 2020-07-28 DIAGNOSIS — Z86010 Personal history of colonic polyps: Secondary | ICD-10-CM

## 2020-07-28 NOTE — Progress Notes (Addendum)
Gastroenterology Pre-Procedure Review  Request Date: 07/28/2020 Requesting Physician: Dr. Berdine Addison @ [facility], Last TCS 01/2011 done by Dr. Gala Romney, tubular adenoma  PATIENT REVIEW QUESTIONS: The patient responded to the following health history questions as indicated:    1. Diabetes Melitis: no 2. Joint replacements in the past 12 months: no 3. Major health problems in the past 3 months: no 4. Has an artificial valve or MVP: no 5. Has a defibrillator: no 6. Has been advised in past to take antibiotics in advance of a procedure like teeth cleaning: no 7. Family history of colon cancer: no 8. Alcohol Use: no 9. Illicit drug Use: no 10. History of sleep apnea: no 11. History of coronary artery or other vascular stents placed within the last 12 months: no 12. History of any prior anesthesia complications: no 13. Body mass index is 31.3 kg/m.    MEDICATIONS & ALLERGIES:    Patient reports the following regarding taking any blood thinners:   Plavix? no Aspirin? yes Coumadin? no Brilinta? no Xarelto? no Eliquis? no Pradaxa? no Savaysa? no Effient? no  Patient confirms/reports the following medications:  Current Outpatient Medications  Medication Sig Dispense Refill  . aspirin EC 81 MG tablet Take 81 mg by mouth daily.    Marland Kitchen atorvastatin (LIPITOR) 10 MG tablet Take 10 mg by mouth daily.    . Esomeprazole Magnesium (NEXIUM PO) Take by mouth daily.     Marland Kitchen lisinopril-hydrochlorothiazide (PRINZIDE,ZESTORETIC) 10-12.5 MG tablet Take 1 tablet by mouth daily.    . Multiple Vitamins-Minerals (CENTRUM SILVER PO) Take by mouth daily.     Marland Kitchen oxybutynin (DITROPAN-XL) 10 MG 24 hr tablet Take 10 mg by mouth daily.     No current facility-administered medications for this visit.    Patient confirms/reports the following allergies:  Allergies  Allergen Reactions  . Shellfish Allergy     No orders of the defined types were placed in this encounter.   AUTHORIZATION INFORMATION Primary  Insurance: [employer] Medicare,  ID #: [id number],  Group #: [id number] Pre-Cert / Josem Kaufmann required: No, not required  SCHEDULE INFORMATION: Procedure has been scheduled as follows:  Date: 08/25/2020, Time: 9:30 Location: [facility] with Dr. Gala Romney  This Gastroenterology Pre-Precedure Review Form is being routed to the following provider(s): Roseanne Kaufman, NP

## 2020-07-28 NOTE — Progress Notes (Signed)
Pt says that he takes hormone shots every 6 months due to hx of prostate cancer.  Pt aware that I will call him once Sept procedure schedules are available.

## 2020-07-28 NOTE — Progress Notes (Signed)
Appropriate. ASA II.  

## 2020-07-29 ENCOUNTER — Telehealth: Payer: Self-pay | Admitting: *Deleted

## 2020-07-29 NOTE — Telephone Encounter (Signed)
Lmom for pt to call me back to schedule procedure.

## 2020-07-30 MED ORDER — PEG 3350-KCL-NA BICARB-NACL 420 G PO SOLR: 4000.0000 mL | Freq: Once | ORAL | 0 refills | Status: AC

## 2020-07-30 NOTE — Addendum Note (Signed)
Addended by: Metro Kung on: 07/30/2020 09:19 AM   Modules accepted: Orders

## 2020-07-30 NOTE — Patient Instructions (Signed)
[name]   March 31, 1951 MRN: [id number]    Procedure Date: 08/25/2020 Time to register: 8:30 am Place to register: [facility] Procedure Time: 9:30 am Scheduled provider: Dr. Gala Romney  PREPARATION FOR COLONOSCOPY WITH TRI-LYTE SPLIT PREP  Please notify us immediately if you are diabetic, take iron supplements, or if you are on Coumadin or any other blood thinners.   Please hold the following medications: n/a  You will need to purchase 1 fleet enema and 1 box of Bisacodyl '5mg'$  tablets.   2 DAYS BEFORE PROCEDURE:  DATE: 08/23/2020   DAY: Monday Begin clear liquid diet AFTER your lunch meal. NO SOLID FOODS after this point.  1 DAY BEFORE PROCEDURE:  DATE: 08/24/2020   DAY: Tuesday Continue clear liquids the entire day - NO SOLID FOOD.   Diabetic medications adjustments for today: n/a  At 2:00 pm:  Take 2 Bisacodyl tablets.   At 4:00pm:  Start drinking your solution. Make sure you mix well per instructions on the bottle. Try to drink 1 (one) 8 ounce glass every 10-15 minutes until you have consumed HALF the jug. You should complete by 6:00pm.You must keep the left over solution refrigerated until completed next day.  Continue clear liquids. You must drink plenty of clear liquids to prevent dehyration and kidney failure.     DAY OF PROCEDURE:   DATE: 08/25/2020    DAY: Wednesday If you take medications for your heart, blood pressure or breathing, you may take these medications.  Diabetic medications adjustments for today: n/a  Five hours before your procedure time @ 4:30 am:  Finish remaining amout of bowel prep, drinking 1 (one) 8 ounce glass every 10-15 minutes until complete. You have two hours to consume remaining prep.   Three hours before your procedure time @ 6:30 am:  Nothing by mouth.   At least one hour before going to the hospital:  Give yourself one Fleet enema. You may take your morning medications with sip of water unless we have instructed otherwise.       Please see below for Dietary Information.  CLEAR LIQUIDS INCLUDE:  Water Jello (NOT red in color)   Ice Popsicles (NOT red in color)   Tea (sugar ok, no milk/cream) Powdered fruit flavored drinks  Coffee (sugar ok, no milk/cream) Gatorade/ Lemonade/ Kool-Aid  (NOT red in color)   Juice: apple, white grape, white cranberry Soft drinks  Clear bullion, consomme, broth (fat free beef/chicken/vegetable)  Carbonated beverages (any kind)  Strained chicken noodle soup Hard Candy   Remember: Clear liquids are liquids that will allow you to see your fingers on the other side of a clear glass. Be sure liquids are NOT red in color, and not cloudy, but CLEAR.  DO NOT EAT OR DRINK ANY OF THE FOLLOWING:  Dairy products of any kind   Cranberry juice Tomato juice / V8 juice   Grapefruit juice Orange juice     Red grape juice  Do not eat any solid foods, including such foods as: cereal, oatmeal, yogurt, fruits, vegetables, creamed soups, eggs, bread, crackers, pureed foods in a blender, etc.   HELPFUL HINTS FOR DRINKING PREP SOLUTION:   Make sure prep is extremely cold. Mix and refrigerate the the morning of the prep. You may also put in the freezer.   You may try mixing some Crystal Light or Country Time Lemonade if you prefer. Mix in small amounts; add more if necessary.  Try drinking through a straw  Rinse mouth with  water or a mouthwash between glasses, to remove after-taste.  Try sipping on a cold beverage /ice/ popsicles between glasses of prep.  Place a piece of sugar-free hard candy in mouth between glasses.  If you become nauseated, try consuming smaller amounts, or stretch out the time between glasses. Stop for 30-60 minutes, then slowly start back drinking.        OTHER INSTRUCTIONS  You will need a responsible adult at least 70 years of age to accompany you and drive you home. This person must remain in the waiting room during your procedure. The hospital will cancel  your procedure if you do not have a responsible adult with you.   1. Wear loose fitting clothing that is easily removed. 2. Leave jewelry and other valuables at home.  3. Remove all body piercing jewelry and leave at home. 4. Total time from sign-in until discharge is approximately 2-3 hours. 5. You should go home directly after your procedure and rest. You can resume normal activities the day after your procedure. 6. The day of your procedure you should not:  Drive  Make legal decisions  Operate machinery  Drink alcohol  Return to work   You may call the office (Dept: [phone]) before 5:00pm, or page the doctor on call [phone]) after 5:00pm, for further instructions, if necessary.   Insurance Information YOU WILL NEED TO CHECK WITH YOUR INSURANCE COMPANY FOR THE BENEFITS OF COVERAGE YOU HAVE FOR THIS PROCEDURE.  UNFORTUNATELY, NOT ALL INSURANCE COMPANIES HAVE BENEFITS TO COVER ALL OR PART OF THESE TYPES OF PROCEDURES.  IT IS YOUR RESPONSIBILITY TO CHECK YOUR BENEFITS, HOWEVER, WE WILL BE GLAD TO ASSIST YOU WITH ANY CODES YOUR INSURANCE COMPANY MAY NEED.    PLEASE NOTE THAT MOST INSURANCE COMPANIES WILL NOT COVER A SCREENING COLONOSCOPY FOR PEOPLE UNDER THE AGE OF 50  IF YOU HAVE BCBS INSURANCE, YOU MAY HAVE BENEFITS FOR A SCREENING COLONOSCOPY BUT IF POLYPS ARE FOUND THE DIAGNOSIS WILL CHANGE AND THEN YOU MAY HAVE A DEDUCTIBLE THAT WILL NEED TO BE MET. SO PLEASE MAKE SURE YOU CHECK YOUR BENEFITS FOR A SCREENING COLONOSCOPY AS WELL AS A DIAGNOSTIC COLONOSCOPY.

## 2020-08-02 NOTE — Progress Notes (Signed)
Spoke to pt and informed him that Sept procedure schedules have been released.  Pt scheduled his procedure for 08/25/2020.  Pt aware to arrive at 8:30.  Pt made aware that I will mail out prep instructions and Covid screening information.

## 2020-08-04 ENCOUNTER — Telehealth: Payer: Self-pay | Admitting: *Deleted

## 2020-08-04 NOTE — Telephone Encounter (Signed)
Pt called asking to speak with Durward Fortes. ,RMA.   [phone]

## 2020-08-04 NOTE — Telephone Encounter (Signed)
Called pt back and he requested to move his Covid screening time to 11:25.

## 2020-08-23 ENCOUNTER — Other Ambulatory Visit (HOSPITAL_COMMUNITY): Payer: Medicare Other

## 2020-08-23 ENCOUNTER — Other Ambulatory Visit: Payer: Self-pay

## 2020-08-23 ENCOUNTER — Other Ambulatory Visit (HOSPITAL_COMMUNITY)
Admission: RE | Admit: 2020-08-23 | Discharge: 2020-08-23 | Disposition: A | Discharge status: Home / Self Care | Payer: Medicare Other | Source: Ambulatory Visit | Attending: Internal Medicine | Admitting: Internal Medicine

## 2020-08-23 DIAGNOSIS — Z20822 Contact with and (suspected) exposure to covid-19: Secondary | ICD-10-CM | POA: Insufficient documentation

## 2020-08-23 DIAGNOSIS — E785 Hyperlipidemia, unspecified: Secondary | ICD-10-CM | POA: Diagnosis not present

## 2020-08-23 DIAGNOSIS — Z01812 Encounter for preprocedural laboratory examination: Secondary | ICD-10-CM | POA: Diagnosis not present

## 2020-08-23 DIAGNOSIS — R310 Gross hematuria: Secondary | ICD-10-CM | POA: Diagnosis not present

## 2020-08-23 DIAGNOSIS — Z0000 Encounter for general adult medical examination without abnormal findings: Secondary | ICD-10-CM | POA: Diagnosis not present

## 2020-08-23 DIAGNOSIS — I10 Essential (primary) hypertension: Secondary | ICD-10-CM | POA: Diagnosis not present

## 2020-08-23 LAB — SARS CORONAVIRUS 2 (TAT 6-24 HRS): SARS Coronavirus 2: NEGATIVE

## 2020-08-25 ENCOUNTER — Other Ambulatory Visit: Payer: Self-pay

## 2020-08-25 ENCOUNTER — Ambulatory Visit (HOSPITAL_COMMUNITY)
Admission: RE | Admit: 2020-08-25 | Discharge: 2020-08-25 | Disposition: A | Discharge status: Home / Self Care | Payer: Medicare Other | Attending: Internal Medicine | Admitting: Internal Medicine

## 2020-08-25 ENCOUNTER — Encounter (HOSPITAL_COMMUNITY)
Admission: RE | Disposition: A | Discharge status: Home / Self Care | Payer: Self-pay | Source: Home / Self Care | Attending: Internal Medicine

## 2020-08-25 ENCOUNTER — Encounter (HOSPITAL_COMMUNITY): Payer: Self-pay | Admitting: Internal Medicine

## 2020-08-25 DIAGNOSIS — D122 Benign neoplasm of ascending colon: Secondary | ICD-10-CM | POA: Diagnosis not present

## 2020-08-25 DIAGNOSIS — I10 Essential (primary) hypertension: Secondary | ICD-10-CM | POA: Diagnosis not present

## 2020-08-25 DIAGNOSIS — Z79899 Other long term (current) drug therapy: Secondary | ICD-10-CM | POA: Insufficient documentation

## 2020-08-25 DIAGNOSIS — F17210 Nicotine dependence, cigarettes, uncomplicated: Secondary | ICD-10-CM | POA: Diagnosis not present

## 2020-08-25 DIAGNOSIS — Z8546 Personal history of malignant neoplasm of prostate: Secondary | ICD-10-CM | POA: Insufficient documentation

## 2020-08-25 DIAGNOSIS — Z86010 Personal history of colonic polyps: Secondary | ICD-10-CM | POA: Diagnosis not present

## 2020-08-25 DIAGNOSIS — Z1211 Encounter for screening for malignant neoplasm of colon: Secondary | ICD-10-CM | POA: Insufficient documentation

## 2020-08-25 DIAGNOSIS — Z7982 Long term (current) use of aspirin: Secondary | ICD-10-CM | POA: Diagnosis not present

## 2020-08-25 DIAGNOSIS — K219 Gastro-esophageal reflux disease without esophagitis: Secondary | ICD-10-CM | POA: Diagnosis not present

## 2020-08-25 DIAGNOSIS — K635 Polyp of colon: Secondary | ICD-10-CM | POA: Diagnosis not present

## 2020-08-25 HISTORY — PX: COLONOSCOPY: SHX5424

## 2020-08-25 HISTORY — DX: Gastro-esophageal reflux disease without esophagitis: K21.9

## 2020-08-25 HISTORY — DX: Essential (primary) hypertension: I10

## 2020-08-25 HISTORY — PX: POLYPECTOMY: SHX5525

## 2020-08-25 SURGERY — COLONOSCOPY
Anesthesia: Moderate Sedation

## 2020-08-25 MED ORDER — ONDANSETRON HCL 4 MG/2ML IJ SOLN
INTRAMUSCULAR | Status: AC
  Filled 2020-08-25: qty 2

## 2020-08-25 MED ORDER — STERILE WATER FOR IRRIGATION IR SOLN
Status: DC | PRN
  Administered 2020-08-25: 1.5 mL

## 2020-08-25 MED ORDER — MEPERIDINE HCL 50 MG/ML IJ SOLN
INTRAMUSCULAR | Status: AC
  Filled 2020-08-25: qty 1

## 2020-08-25 MED ORDER — MIDAZOLAM HCL 5 MG/5ML IJ SOLN
INTRAMUSCULAR | Status: DC | PRN
  Administered 2020-08-25: 1 mg via INTRAVENOUS
  Administered 2020-08-25 (×2): 2 mg via INTRAVENOUS

## 2020-08-25 MED ORDER — SODIUM CHLORIDE 0.9 % IV SOLN: INTRAVENOUS | Status: DC

## 2020-08-25 MED ORDER — MEPERIDINE HCL 100 MG/ML IJ SOLN
INTRAMUSCULAR | Status: DC | PRN
  Administered 2020-08-25: 15 mg via INTRAVENOUS
  Administered 2020-08-25: 10 mg via INTRAVENOUS
  Administered 2020-08-25: 25 mg via INTRAVENOUS

## 2020-08-25 MED ORDER — ONDANSETRON HCL 4 MG/2ML IJ SOLN
INTRAMUSCULAR | Status: DC | PRN
  Administered 2020-08-25: 4 mg via INTRAVENOUS

## 2020-08-25 MED ORDER — MIDAZOLAM HCL 5 MG/5ML IJ SOLN
INTRAMUSCULAR | Status: AC
  Filled 2020-08-25: qty 10

## 2020-08-25 NOTE — Op Note (Signed)
[facility] Patient Name: [name] Procedure Date: 08/25/2020 8:52 AM MRN: [id number] Date of Birth: 05/11/1951 Attending MD: Norvel Richards , MD CSN: [id number] Age: 69 Admit Type: Outpatient Procedure:                Colonoscopy Indications:              High risk colon cancer surveillance: Personal                            history of colonic polyps Providers:                Norvel Richards, MD, Otis Peak B. Sharon Seller, RN,                            Caprice Kluver, Lurline Del, RN Referring MD:              Medicines:                Meperidine 50 mg IV, Midazolam 5 mg IV Complications:            No immediate complications. Estimated Blood Loss:     Estimated blood loss was minimal. Procedure:                Pre-Anesthesia Assessment:                           - Prior to the procedure, a History and Physical                            was performed, and patient medications and                            allergies were reviewed. The patient's tolerance of                            previous anesthesia was also reviewed. The risks                            and benefits of the procedure and the sedation                            options and risks were discussed with the patient.                            All questions were answered, and informed consent                            was obtained. Prior Anticoagulants: The patient has                            taken no previous anticoagulant or antiplatelet                            agents. ASA Grade Assessment: II - A patient with  mild systemic disease. After reviewing the risks                            and benefits, the patient was deemed in                            satisfactory condition to undergo the procedure.                           After obtaining informed consent, the colonoscope                            was passed under direct vision. Throughout the                             procedure, the patient's blood pressure, pulse, and                            oxygen saturations were monitored continuously. The                            CF-HQ190L ([id number]) scope was introduced through                            the anus and advanced to the the cecum, identified                            by appendiceal orifice and ileocecal valve. The                            colonoscopy was performed without difficulty. The                            patient tolerated the procedure well. The quality                            of the bowel preparation was adequate. The                            ileocecal valve, appendiceal orifice, and rectum                            were photographed. The entire colon was well                            visualized. Scope In: 9:11:05 AM Scope Out: 9:33:44 AM Scope Withdrawal Time: 0 hours 14 minutes 34 seconds  Total Procedure Duration: 0 hours 22 minutes 39 seconds  Findings:      The perianal and digital rectal examinations were normal.      Three sessile polyps were found in the ascending colon. The polyps were       4 to 5 mm in size. These polyps were removed with a cold snare.       Resection and retrieval were complete. Estimated blood loss  was minimal.      The exam was otherwise without abnormality on direct and retroflexion       views. Impression:               - Three 4 to 5 mm polyps in the ascending colon,                            removed with a cold snare. Resected and retrieved.                           - The examination was otherwise normal on direct                            and retroflexion views. Moderate Sedation:      Moderate (conscious) sedation was personally administered by an       anesthesia professional. The following parameters were monitored: oxygen       saturation, heart rate, blood pressure, respiratory rate, EKG, adequacy       of pulmonary ventilation, and response to care. Total physician        intraservice time was 28 minutes. Recommendation:           - Patient has a contact number available for                            emergencies. The signs and symptoms of potential                            delayed complications were discussed with the                            patient. Return to normal activities tomorrow.                            Written discharge instructions were provided to the                            patient.                           - Advance diet as tolerated.                           - Continue present medications.                           - Repeat colonoscopy date to be determined after                            pending pathology results are reviewed for                            surveillance.                           - Return to GI office (date not yet determined). Procedure Code(s):        ---  Professional ---                           [phone], Colonoscopy, flexible; with removal of                            tumor(s), polyp(s), or other lesion(s) by snare                            technique Diagnosis Code(s):        --- Professional ---                           Z86.010, Personal history of colonic polyps                           K63.5, Polyp of colon CPT copyright 2019 American Medical Association. All rights reserved. The codes documented in this report are preliminary and upon coder review may  be revised to meet current compliance requirements. Cristopher Estimable. Oreatha Fabry, MD Norvel Richards, MD 08/25/2020 9:45:15 AM This report has been signed electronically. Number of Addenda: 0

## 2020-08-25 NOTE — H&P (Signed)
@LOGO @   Primary Care Physician:  Iona Beard, MD Primary Gastroenterologist:  Dr. Gala Romney  Pre-Procedure History & Physical: HPI:  [name] is a 69 y.o. male here for for surveillance colonoscopy.  History of colonic adenomas removed 2006 and 2012.  Past Medical History:  Diagnosis Date  . GERD (gastroesophageal reflux disease)   . Hypertension   . Prostate cancer [facility])     Past Surgical History:  Procedure Laterality Date  . COLONOSCOPY    . ROBOT ASSISTED LAPAROSCOPIC RADICAL PROSTATECTOMY    . TONSILLECTOMY      Prior to Admission medications   Medication Sig Start Date End Date Taking? Authorizing Provider  aspirin EC 81 MG tablet Take 81 mg by mouth daily.   Yes [provider]  atorvastatin (LIPITOR) 10 MG tablet Take 10 mg by mouth daily. 03/16/20  Yes [provider]  esomeprazole (NEXIUM) 20 MG capsule Take 20 mg by mouth daily.   Yes [provider]  lisinopril-hydrochlorothiazide (PRINZIDE,ZESTORETIC) 10-12.5 MG tablet Take 1 tablet by mouth every other day.    Yes [provider]  Multiple Vitamins-Minerals (CENTRUM SILVER PO) Take 1 tablet by mouth daily.    Yes [provider]  oxybutynin (DITROPAN-XL) 10 MG 24 hr tablet Take 10 mg by mouth daily. 01/09/19  Yes [provider]    Allergies as of 08/02/2020 - Review Complete 07/28/2020  Allergen Reaction Noted  . Shellfish allergy  03/18/2019    Family History  Problem Relation Age of Onset  . Gastric cancer Mother   . Prostate cancer Father   . Breast cancer Neg Hx   . Pancreatic cancer Neg Hx     Social History   Socioeconomic History  . Marital status: Single    Spouse name: Not on file  . Number of children: 1  . Years of education: Not on file  . Highest education level: Not on file  Occupational History    Comment: customer service   Tobacco Use  . Smoking status: Current Some Day Smoker    Packs/day: 0.25    Years: 21.00     Pack years: 5.25    Types: Cigarettes  . Smokeless tobacco: Never Used  Vaping Use  . Vaping Use: Never used  Substance and Sexual Activity  . Alcohol use: Never  . Drug use: Never  . Sexual activity: Not Currently  Other Topics Concern  . Not on file  Social History Narrative   Has one son.    Social Determinants of Health   Financial Resource Strain:   . Difficulty of Paying Living Expenses: Not on file  Food Insecurity:   . Worried About Charity fundraiser in the Last Year: Not on file  . Ran Out of Food in the Last Year: Not on file  Transportation Needs:   . Lack of Transportation (Medical): Not on file  . Lack of Transportation (Non-Medical): Not on file  Physical Activity:   . Days of Exercise per Week: Not on file  . Minutes of Exercise per Session: Not on file  Stress:   . Feeling of Stress : Not on file  Social Connections:   . Frequency of Communication with Friends and Family: Not on file  . Frequency of Social Gatherings with Friends and Family: Not on file  . Attends Religious Services: Not on file  . Active Member of Clubs or Organizations: Not on file  . Attends Archivist Meetings: Not on file  .  Marital Status: Not on file  Intimate Partner Violence:   . Fear of Current or Ex-Partner: Not on file  . Emotionally Abused: Not on file  . Physically Abused: Not on file  . Sexually Abused: Not on file    Review of Systems: See HPI, otherwise negative ROS  Physical Exam: BP (!) 174/74   Pulse (!) 108   Temp 98.6 F (37 C) (Oral)   Resp 16   Ht 5\' 11"  (1.803 m)   Wt 101.6 kg   SpO2 98%   BMI 31.24 kg/m  General:   Alert,  Well-developed, well-nourished, pleasant and cooperative in NAD Neck:  Supple; no masses or thyromegaly. No significant cervical adenopathy. Lungs:  Clear throughout to auscultation.   No wheezes, crackles, or rhonchi. No acute distress. Heart:  Regular rate and rhythm; no murmurs, clicks, rubs,  or gallops. Abdomen:  Non-distended, normal bowel sounds.  Soft and nontender without appreciable mass or hepatosplenomegaly.  Pulses:  Normal pulses noted. Extremities:  Without clubbing or edema.  Impression/Plan: 69 year old gentleman with a history of multiple colonic adenomas removed over time here for surveillance colonoscopy per plan. The risks, benefits, limitations, alternatives and imponderables have been reviewed with the patient. Questions have been answered. All parties are agreeable.      Notice: This dictation was prepared with Dragon dictation along with smaller phrase technology. Any transcriptional errors that result from this process are unintentional and may not be corrected upon review.

## 2020-08-25 NOTE — Discharge Instructions (Signed)
Colonoscopy Discharge Instructions  Read the instructions outlined below and refer to this sheet in the next few weeks. These discharge instructions provide you with general information on caring for yourself after you leave the hospital. Your doctor may also give you specific instructions. While your treatment has been planned according to the most current medical practices available, unavoidable complications occasionally occur. If you have any problems or questions after discharge, call Dr. Gala Romney at [phone]. ACTIVITY  You may resume your regular activity, but move at a slower pace for the next 24 hours.   Take frequent rest periods for the next 24 hours.   Walking will help get rid of the air and reduce the bloated feeling in your belly (abdomen).   No driving for 24 hours (because of the medicine (anesthesia) used during the test).    Do not sign any important legal documents or operate any machinery for 24 hours (because of the anesthesia used during the test).  NUTRITION  Drink plenty of fluids.   You may resume your normal diet as instructed by your doctor.   Begin with a light meal and progress to your normal diet. Heavy or fried foods are harder to digest and may make you feel sick to your stomach (nauseated).   Avoid alcoholic beverages for 24 hours or as instructed.  MEDICATIONS  You may resume your normal medications unless your doctor tells you otherwise.  WHAT YOU CAN EXPECT TODAY  Some feelings of bloating in the abdomen.   Passage of more gas than usual.   Spotting of blood in your stool or on the toilet paper.  IF YOU HAD POLYPS REMOVED DURING THE COLONOSCOPY:  No aspirin products for 7 days or as instructed.   No alcohol for 7 days or as instructed.   Eat a soft diet for the next 24 hours.  FINDING OUT THE RESULTS OF YOUR TEST Not all test results are available during your visit. If your test results are not back during the visit, make an appointment  with your caregiver to find out the results. Do not assume everything is normal if you have not heard from your caregiver or the medical facility. It is important for you to follow up on all of your test results.  SEEK IMMEDIATE MEDICAL ATTENTION IF:  You have more than a spotting of blood in your stool.   Your belly is swollen (abdominal distention).   You are nauseated or vomiting.   You have a temperature over 101.   You have abdominal pain or discomfort that is severe or gets worse throughout the day.   Three polyps removed from your colon today  Further recommendations to follow pending review of pathology report  At patient request, I called Garnetta Buddy at [phone] -reviewed results   Colon Polyps  Polyps are tissue growths inside the body. Polyps can grow in many places, including the large intestine (colon). A polyp may be a round bump or a mushroom-shaped growth. You could have one polyp or several. Most colon polyps are noncancerous (benign). However, some colon polyps can become cancerous over time. Finding and removing the polyps early can help prevent this. What are the causes? The exact cause of colon polyps is not known. What increases the risk? You are more likely to develop this condition if you:  Have a family history of colon cancer or colon polyps.  Are older than 67 or older than 45 if you are African American.  Have inflammatory bowel  disease, such as ulcerative colitis or Crohn's disease.  Have certain hereditary conditions, such as: ? Familial adenomatous polyposis. ? Lynch syndrome. ? Turcot syndrome. ? Peutz-Jeghers syndrome.  Are overweight.  Smoke cigarettes.  Do not get enough exercise.  Drink too much alcohol.  Eat a diet that is high in fat and red meat and low in fiber.  Had childhood cancer that was treated with abdominal radiation. What are the signs or symptoms? Most polyps do not cause symptoms. If you have symptoms, they  may include:  Blood coming from your rectum when having a bowel movement.  Blood in your stool. The stool may look dark red or black.  Abdominal pain.  A change in bowel habits, such as constipation or diarrhea. How is this diagnosed? This condition is diagnosed with a colonoscopy. This is a procedure in which a lighted, flexible scope is inserted into the anus and then passed into the colon to examine the area. Polyps are sometimes found when a colonoscopy is done as part of routine cancer screening tests. How is this treated? Treatment for this condition involves removing any polyps that are found. Most polyps can be removed during a colonoscopy. Those polyps will then be tested for cancer. Additional treatment may be needed depending on the results of testing. Follow these instructions at home: Lifestyle  Maintain a healthy weight, or lose weight if recommended by your health care provider.  Exercise every day or as told by your health care provider.  Do not use any products that contain nicotine or tobacco, such as cigarettes and e-cigarettes. If you need help quitting, ask your health care provider.  If you drink alcohol, limit how much you have: ? 0-1 drink a day for women. ? 0-2 drinks a day for men.  Be aware of how much alcohol is in your drink. In the [place] one drink equals one 12 oz bottle of beer (355 mL), one 5 oz glass of wine (148 mL), or one 1 oz shot of hard liquor (44 mL). Eating and drinking   Eat foods that are high in fiber, such as fruits, vegetables, and whole grains.  Eat foods that are high in calcium and vitamin D, such as milk, cheese, yogurt, eggs, liver, fish, and broccoli.  Limit foods that are high in fat, such as fried foods and desserts.  Limit the amount of red meat and processed meat you eat, such as hot dogs, sausage, bacon, and lunch meats. General instructions  Keep all follow-up visits as told by your health care provider. This is  important. ? This includes having regularly scheduled colonoscopies. ? Talk to your health care provider about when you need a colonoscopy. Contact a health care provider if:  You have new or worsening bleeding during a bowel movement.  You have new or increased blood in your stool.  You have a change in bowel habits.  You lose weight for no known reason. Summary  Polyps are tissue growths inside the body. Polyps can grow in many places, including the colon.  Most colon polyps are noncancerous (benign), but some can become cancerous over time.  This condition is diagnosed with a colonoscopy.  Treatment for this condition involves removing any polyps that are found. Most polyps can be removed during a colonoscopy. This information is not intended to replace advice given to you by your health care provider. Make sure you discuss any questions you have with your health care provider. Document Revised: 03/28/2018 Document Reviewed: 03/28/2018  Elsevier Patient Education  2020 [employer]   

## 2020-08-26 LAB — SURGICAL PATHOLOGY

## 2020-08-31 ENCOUNTER — Encounter (HOSPITAL_COMMUNITY): Payer: Self-pay | Admitting: Internal Medicine

## 2020-09-02 ENCOUNTER — Encounter: Payer: Self-pay | Admitting: Internal Medicine

## 2021-03-07 DIAGNOSIS — K429 Umbilical hernia without obstruction or gangrene: Secondary | ICD-10-CM | POA: Diagnosis not present

## 2021-03-07 DIAGNOSIS — I10 Essential (primary) hypertension: Secondary | ICD-10-CM | POA: Diagnosis not present

## 2021-03-07 DIAGNOSIS — R310 Gross hematuria: Secondary | ICD-10-CM | POA: Diagnosis not present

## 2021-03-07 DIAGNOSIS — Z7189 Other specified counseling: Secondary | ICD-10-CM | POA: Diagnosis not present

## 2021-04-19 DIAGNOSIS — N210 Calculus in bladder: Secondary | ICD-10-CM | POA: Diagnosis not present

## 2021-04-19 DIAGNOSIS — R310 Gross hematuria: Secondary | ICD-10-CM | POA: Diagnosis not present

## 2021-05-03 ENCOUNTER — Other Ambulatory Visit: Payer: Self-pay | Admitting: Urology

## 2021-05-12 ENCOUNTER — Other Ambulatory Visit: Payer: Self-pay

## 2021-05-12 ENCOUNTER — Encounter (HOSPITAL_BASED_OUTPATIENT_CLINIC_OR_DEPARTMENT_OTHER): Payer: Self-pay | Admitting: Urology

## 2021-05-12 NOTE — Progress Notes (Signed)
Spoke w/ via phone for pre-op interview---PT Lab needs dos----  I stat, ekg             Lab results------none COVID test -----patient states asymptomatic no test needed Arrive at -------930 am 05-18-2021 NPO after MN NO Solid Food.  Clear liquids from MN until---830 am then npo Med rec completed Medications to take morning of surgery -----atorvastatin, nexium, oxybutynin, do not take bp med am of surgery Diabetic medication -----n/a Patient instructed to bring photo id and insurance card day of surgery Patient aware to have Driver (ride ) / caregiver sister Garnetta Buddy or friend evonne wikerson (candy)    for 24 hours after surgery  Patient Special Instructions -----no smoking 24 hours before surgery Pre-Op special Istructions -----none Patient verbalized understanding of instructions that were given at this phone interview. Patient denies shortness of breath, chest pain, fever, cough at this phone interview.

## 2021-05-18 ENCOUNTER — Ambulatory Visit (HOSPITAL_BASED_OUTPATIENT_CLINIC_OR_DEPARTMENT_OTHER): Payer: Medicare Other | Admitting: Anesthesiology

## 2021-05-18 ENCOUNTER — Encounter (HOSPITAL_BASED_OUTPATIENT_CLINIC_OR_DEPARTMENT_OTHER)
Admission: RE | Disposition: A | Discharge status: Home / Self Care | Payer: Self-pay | Source: Home / Self Care | Attending: Urology

## 2021-05-18 ENCOUNTER — Encounter (HOSPITAL_BASED_OUTPATIENT_CLINIC_OR_DEPARTMENT_OTHER): Payer: Self-pay | Admitting: Urology

## 2021-05-18 ENCOUNTER — Ambulatory Visit (HOSPITAL_BASED_OUTPATIENT_CLINIC_OR_DEPARTMENT_OTHER)
Admission: RE | Admit: 2021-05-18 | Discharge: 2021-05-18 | Disposition: A | Discharge status: Home / Self Care | Payer: Medicare Other | Attending: Urology | Admitting: Urology

## 2021-05-18 ENCOUNTER — Other Ambulatory Visit: Payer: Self-pay

## 2021-05-18 DIAGNOSIS — N210 Calculus in bladder: Secondary | ICD-10-CM | POA: Insufficient documentation

## 2021-05-18 DIAGNOSIS — E7800 Pure hypercholesterolemia, unspecified: Secondary | ICD-10-CM | POA: Insufficient documentation

## 2021-05-18 DIAGNOSIS — Z800 Family history of malignant neoplasm of digestive organs: Secondary | ICD-10-CM | POA: Diagnosis not present

## 2021-05-18 DIAGNOSIS — R310 Gross hematuria: Secondary | ICD-10-CM | POA: Insufficient documentation

## 2021-05-18 DIAGNOSIS — F17200 Nicotine dependence, unspecified, uncomplicated: Secondary | ICD-10-CM | POA: Insufficient documentation

## 2021-05-18 DIAGNOSIS — E785 Hyperlipidemia, unspecified: Secondary | ICD-10-CM | POA: Diagnosis not present

## 2021-05-18 DIAGNOSIS — C61 Malignant neoplasm of prostate: Secondary | ICD-10-CM | POA: Diagnosis not present

## 2021-05-18 DIAGNOSIS — K219 Gastro-esophageal reflux disease without esophagitis: Secondary | ICD-10-CM | POA: Diagnosis not present

## 2021-05-18 DIAGNOSIS — Z8719 Personal history of other diseases of the digestive system: Secondary | ICD-10-CM | POA: Insufficient documentation

## 2021-05-18 DIAGNOSIS — Z8679 Personal history of other diseases of the circulatory system: Secondary | ICD-10-CM | POA: Diagnosis not present

## 2021-05-18 DIAGNOSIS — Z8249 Family history of ischemic heart disease and other diseases of the circulatory system: Secondary | ICD-10-CM | POA: Insufficient documentation

## 2021-05-18 DIAGNOSIS — I10 Essential (primary) hypertension: Secondary | ICD-10-CM | POA: Diagnosis not present

## 2021-05-18 DIAGNOSIS — Z8042 Family history of malignant neoplasm of prostate: Secondary | ICD-10-CM | POA: Diagnosis not present

## 2021-05-18 DIAGNOSIS — Z9079 Acquired absence of other genital organ(s): Secondary | ICD-10-CM | POA: Diagnosis not present

## 2021-05-18 HISTORY — DX: Hyperlipidemia, unspecified: E78.5

## 2021-05-18 HISTORY — DX: Presence of spectacles and contact lenses: Z97.3

## 2021-05-18 HISTORY — DX: Presence of dental prosthetic device (complete) (partial): Z97.2

## 2021-05-18 HISTORY — PX: CYSTOSCOPY WITH LITHOLAPAXY: SHX1425

## 2021-05-18 HISTORY — DX: Personal history of urinary calculi: Z87.442

## 2021-05-18 LAB — POCT I-STAT, CHEM 8
BUN: 16 mg/dL (ref 8–23)
Calcium, Ion: 1.32 mmol/L (ref 1.15–1.40)
Chloride: 104 mmol/L (ref 98–111)
Creatinine, Ser: 1.2 mg/dL (ref 0.61–1.24)
Glucose, Bld: 122 mg/dL — ABNORMAL HIGH (ref 70–99)
HCT: 37 % — ABNORMAL LOW (ref 39.0–52.0)
Hemoglobin: 12.6 g/dL — ABNORMAL LOW (ref 13.0–17.0)
Potassium: 4.2 mmol/L (ref 3.5–5.1)
Sodium: 140 mmol/L (ref 135–145)
TCO2: 24 mmol/L (ref 22–32)

## 2021-05-18 SURGERY — CYSTOSCOPY, WITH BLADDER CALCULUS LITHOLAPAXY
Anesthesia: General | Site: Bladder

## 2021-05-18 MED ORDER — FENTANYL CITRATE (PF) 100 MCG/2ML IJ SOLN
INTRAMUSCULAR | Status: AC
  Filled 2021-05-18: qty 2

## 2021-05-18 MED ORDER — PROPOFOL 10 MG/ML IV BOLUS
INTRAVENOUS | Status: AC
  Filled 2021-05-18: qty 20

## 2021-05-18 MED ORDER — DEXAMETHASONE SODIUM PHOSPHATE 10 MG/ML IJ SOLN
INTRAMUSCULAR | Status: AC
  Filled 2021-05-18: qty 1

## 2021-05-18 MED ORDER — STERILE WATER FOR IRRIGATION IR SOLN
Status: DC | PRN
  Administered 2021-05-18: 3000 mL via INTRAVESICAL

## 2021-05-18 MED ORDER — ACETAMINOPHEN 500 MG PO TABS
ORAL_TABLET | ORAL | Status: AC
  Filled 2021-05-18: qty 2

## 2021-05-18 MED ORDER — PROPOFOL 10 MG/ML IV BOLUS
INTRAVENOUS | Status: DC | PRN
  Administered 2021-05-18: 200 mg via INTRAVENOUS

## 2021-05-18 MED ORDER — LACTATED RINGERS IV SOLN: INTRAVENOUS | Status: DC

## 2021-05-18 MED ORDER — ACETAMINOPHEN 500 MG PO TABS
1000.0000 mg | ORAL_TABLET | Freq: Once | ORAL | Status: AC
  Administered 2021-05-18: 1000 mg via ORAL

## 2021-05-18 MED ORDER — FENTANYL CITRATE (PF) 100 MCG/2ML IJ SOLN
INTRAMUSCULAR | Status: DC | PRN
  Administered 2021-05-18: 50 ug via INTRAVENOUS

## 2021-05-18 MED ORDER — DEXAMETHASONE SODIUM PHOSPHATE 10 MG/ML IJ SOLN
INTRAMUSCULAR | Status: DC | PRN
  Administered 2021-05-18: 4 mg via INTRAVENOUS

## 2021-05-18 MED ORDER — ONDANSETRON HCL 4 MG/2ML IJ SOLN
INTRAMUSCULAR | Status: AC
  Filled 2021-05-18: qty 2

## 2021-05-18 MED ORDER — LIDOCAINE 2% (20 MG/ML) 5 ML SYRINGE
INTRAMUSCULAR | Status: DC | PRN
  Administered 2021-05-18: 100 mg via INTRAVENOUS

## 2021-05-18 MED ORDER — CEFAZOLIN SODIUM-DEXTROSE 2-4 GM/100ML-% IV SOLN
2.0000 g | Freq: Once | INTRAVENOUS | Status: AC
  Administered 2021-05-18: 2 g via INTRAVENOUS

## 2021-05-18 MED ORDER — CEFAZOLIN SODIUM-DEXTROSE 2-4 GM/100ML-% IV SOLN
INTRAVENOUS | Status: AC
  Filled 2021-05-18: qty 100

## 2021-05-18 MED ORDER — LIDOCAINE 2% (20 MG/ML) 5 ML SYRINGE
INTRAMUSCULAR | Status: AC
  Filled 2021-05-18: qty 5

## 2021-05-18 MED ORDER — ONDANSETRON HCL 4 MG/2ML IJ SOLN
INTRAMUSCULAR | Status: DC | PRN
  Administered 2021-05-18: 4 mg via INTRAVENOUS

## 2021-05-18 SURGICAL SUPPLY — 15 items
BALLN NEPHROSTOMY (BALLOONS) ×2
BALLOON NEPHROSTOMY (BALLOONS) ×1 IMPLANT
CLOTH BEACON ORANGE TIMEOUT ST (SAFETY) ×2 IMPLANT
FIBER LASER FLEXIVA 1000 (UROLOGICAL SUPPLIES) ×2 IMPLANT
FIBER LASER FLEXIVA 365 (UROLOGICAL SUPPLIES) ×1 IMPLANT
FIBER LASER FLEXIVA 550 (UROLOGICAL SUPPLIES) ×2 IMPLANT
GLOVE SURG ENC MOIS LTX SZ7.5 (GLOVE) ×2 IMPLANT
GOWN STRL REUS W/TWL XL LVL3 (GOWN DISPOSABLE) ×2 IMPLANT
KIT TURNOVER CYSTO (KITS) ×2 IMPLANT
MANIFOLD NEPTUNE II (INSTRUMENTS) ×2 IMPLANT
PACK CYSTO (CUSTOM PROCEDURE TRAY) ×2 IMPLANT
SYR TOOMEY IRRIG 70ML (MISCELLANEOUS) ×2
SYRINGE TOOMEY IRRIG 70ML (MISCELLANEOUS) IMPLANT
TUBE CONNECTING 12X1/4 (SUCTIONS) IMPLANT
WATER STERILE IRR 3000ML UROMA (IV SOLUTION) ×2 IMPLANT

## 2021-05-18 NOTE — H&P (Signed)
PRE-OP H&P  Office Visit Report     04/19/2021   --------------------------------------------------------------------------------   [name]  MRN: [id number]  DOB: 02-25-1951, 70 year old Male   PRIMARY CARE:  Iona Beard, MD  REFERRING:  Glena Norfolk. Lovena Neighbours, MD  PROVIDER:  Ellison Hughs, M.D.  LOCATION:  [facility] [phone]     --------------------------------------------------------------------------------   CC/HPI: Prostate cancer   HPI: [name] is a 70 year old male, previously followed by Dr. Risa Grill, with a history of pT2, NO, Mx, Gleason 3+4=7 prostate cancer, s/p RALP in 2011. He developed a biochemical recurrence post-op. He completed salvage EBRT in August 2020 and is currently on ADT via Lupron/Eligard.   Last Eligard injection: 10/21/20  Last PSA- <0.015 (03/2021), <0.015 (09/2020), <0.015 (03/2020), <0.015 (12/2019), <0.015 (09/2019), 0.067 (06/2019), 0.69 (01/2019), 0.51 (01/2018), 0.39 (07/2017).  PSA at diagnosis- 13.3   The patient is here today for a routine follow-up. His PSA remains undetectable. He reports an episode of gross hematuria November/December with no residual sequela. He denies associated flank pain, nausea/vomiting or stone passage. He was noted to have a small bladder stone on CT from 2020, but could not identify a stone on KUB from last year. UA is clear today. He continues to take oxybutynin daily and denies any significant change in his LUTS.     ALLERGIES: No Allergies    MEDICATIONS: Oxybutynin Chloride Er 10 mg tablet, extended release 24 hr 1 tablet PO Daily  Aspirin Ec 81 mg tablet, delayed release 0 Oral  Atorvastatin Calcium 10 mg tablet  Centrum Silver  Lisinopril-Hydrochlorothiazide 10 mg-12.5 mg tablet 1 Oral Daily  Nexium 40 mg capsule,delayed release Oral     GU PSH: None     PSH Notes: Prostatectomy Robotic-Assisted, Complete Colonoscopy, Tonsillectomy   NON-GU PSH: Diagnostic Colonoscopy -  2010 Remove Tonsils - 2008     GU PMH: Rising PSA after prostate cancer treatment - 10/21/2020, - 2020 Bladder Stone - 04/08/2020 ED following radical prostatectomy - 2019 Prostate Cancer - 2018, - 2017, Prostate cancer, - 2017 ED due to arterial insufficiency, Erectile dysfunction due to arterial insufficiency - 2015 Stress Incontinence, Male stress incontinence - 2015 Elevated PSA, Elevated prostate specific antigen (PSA) - 2014 Renal calculus, Nephrolithiasis - 2014 Renal cyst, Renal cyst, acquired - 2014 Urinary incontinence, Unspec, Urinary incontinence - 2014      PMH Notes: Past Gu Hx:    [name] is status post robotic prostatectomy March 16, 2010. We were fairly concerned about his situation going into surgery ( 12/12 positive cores). His final pathology, however, was relatively encouraging. The patient had bilateral Gleason 3+4=7 cancer. No evidence of extracapsular extension, margins negative as were seminal vesicles. The patient does continue to have at least mild stress incontinence. Does were pads daily. Preoperative sexual functioning was 21/25. Initial PSA testing was 0.0.   He has had fairly good erections with Viagra and some spontaneous erections as well.    We have been concerned about possible PSA recurrence. His PSA did increase to 0.06- 0.07 range. It then increased to 0.10 in July of 2013. Again the patient had negative margins and was at lower risk for local regional recurrence. Patient's repeat PSA in November 2013 was actually stable to slightly lower at 0.09. PSA now in June of 2014 is increased slightly to 0.11. This certainly would suggest a probable PSA recurrence. Very slow increase in his PSA would be more suggestive of a local regional recurrence although  his negative margins status at the time of final pathology would be more consistent with distant disease.    In December 2014 PSA was up to 0.16. He understood that this was highly suggestive of a  biochemical recurrence. He elected not to proceed with an attempted salvage radiation therapy and chose ongoing observation. Repeat PSA in June 2015 minimally to 0.17     NON-GU PMH: Encounter for general adult medical examination without abnormal findings, Encounter for preventive health examination - 2017 Muscle weakness (generalized), Muscle weakness - 2014 Other lack of coordination, Other lack of coordination - 2014 Personal history of other diseases of the circulatory system, History of hypertension - 2014 Personal history of other diseases of the digestive system, History of esophageal reflux - 2014 Hypercholesterolemia    FAMILY HISTORY: Cardiac Failure - Mother Death In The Family Brother - [place] In Family Death In The Family Father - [place] In Family Death In The Family Mother - [place] In Family Family Health Status Number - [place] In Family Gastric Cancer - Mother Heart Disease - Mother Prostate Cancer - Father renal failure - Brother   SOCIAL HISTORY: Marital Status: Single Preferred Language: English; Ethnicity: Not Hispanic Or Latino; Race: Black or African American Current Smoking Status: Patient smokes occasionally.  Has never drank.  Drinks 2 caffeinated drinks per day.     Notes: Current every day smoker, Self-reliant In Usual Daily Activities, Exercise Habits, Alcohol Use, Occupation:, Tobacco Use, Caffeine Use, Marital History - Single   REVIEW OF SYSTEMS:    GU Review Male:   Patient denies frequent urination, hard to postpone urination, burning/ pain with urination, get up at night to urinate, leakage of urine, stream starts and stops, trouble starting your stream, have to strain to urinate , erection problems, and penile pain.  Gastrointestinal (Upper):   Patient denies nausea, vomiting, and indigestion/ heartburn.  Gastrointestinal (Lower):   Patient denies diarrhea and constipation.  Constitutional:   Patient denies fever, night sweats, weight loss, and fatigue.   Skin:   Patient denies skin rash/ lesion and itching.  Eyes:   Patient denies blurred vision and double vision.  Ears/ Nose/ Throat:   Patient denies sore throat and sinus problems.  Hematologic/Lymphatic:   Patient denies swollen glands and easy bruising.  Cardiovascular:   Patient denies leg swelling and chest pains.  Respiratory:   Patient denies cough and shortness of breath.  Endocrine:   Patient denies excessive thirst.  Musculoskeletal:   Patient denies back pain and joint pain.  Neurological:   Patient denies headaches and dizziness.  Psychologic:   Patient denies depression and anxiety.   VITAL SIGNS:      04/19/2021 08:50 AM  Weight 220 lb / 99.79 kg  BP 162/73 mmHg  Pulse 76 /min   GU PHYSICAL EXAMINATION:    Urethral Meatus: Normal size. No lesion, no wart, no discharge, no polyp. Normal location.  Penis: Circumcised, no warts, no cracks. No dorsal Peyronie's plaques, no left corporal Peyronie's plaques, no right corporal Peyronie's plaques, no scarring, no warts. No balanitis, no meatal stenosis.   MULTI-SYSTEM PHYSICAL EXAMINATION:    Constitutional: Well-nourished. No physical deformities. Normally developed. Good grooming.  Respiratory: No labored breathing, no use of accessory muscles.   Cardiovascular: Normal temperature, normal extremity pulses, no swelling, no varicosities.  Neurologic / Psychiatric: Oriented to time, oriented to place, oriented to person. No depression, no anxiety, no agitation.  Gastrointestinal: No mass, no tenderness, no rigidity, non obese abdomen.  Musculoskeletal:  Normal gait and station of head and neck.     Complexity of Data:  Lab Test Review:   PSA  Records Review:   Previous Patient Records  X-Ray Review: C.T. Abdomen/Pelvis: Reviewed Films. Reviewed Report. Discussed With Patient. CLINICAL DATA: History of prostatectomy in 2011 for prostate cancer. Rising PSA. History of smoking and nephrolithiasis. EXAM: CT CHEST, ABDOMEN AND  PELVIS WITHOUT CONTRAST TECHNIQUE: Multidetector CT imaging of the chest, abdomen and pelvis was performed following the standard protocol without IV contrast. COMPARISON: 01/07/2010 CT abdomen/pelvis. FINDINGS: CT CHEST FINDINGS Cardiovascular: Normal heart size. No significant pericardial effusion/thickening. Three-vessel coronary atherosclerosis. Atherosclerotic nonaneurysmal thoracic aorta. Normal caliber pulmonary arteries. Mediastinum/Nodes: No discrete thyroid nodules. Unremarkable esophagus. No pathologically enlarged axillary, mediastinal or hilar lymph nodes, noting limited sensitivity for the detection of hilar adenopathy on this noncontrast study. Lungs/Pleura: No pneumothorax. No pleural effusion. No acute consolidative airspace disease or lung masses. Three scattered solid pulmonary nodules in the lungs bilaterally, largest 5 mm in the right upper lobe (series 3/image 27). Musculoskeletal: No aggressive appearing focal osseous lesions. Minimal thoracic spondylosis. CT ABDOMEN PELVIS FINDINGS Hepatobiliary: Normal liver size. A few scattered tiny subcentimeter hypodense liver lesions are too small to characterize and are not appreciably changed since 2011 CT, considered benign. No new liver lesions. Normal gallbladder with no radiopaque cholelithiasis. No biliary ductal dilatation. Pancreas: Normal, with no mass or duct dilation. Spleen: Normal size. No mass. Adrenals/Urinary Tract: Normal adrenals. Nonobstructing 2 mm lower right renal stone. No additional renal stones. No hydronephrosis. Scattered simple renal cysts in both kidneys, largest 4.8 cm in the interpolar right kidney. Nondistended bladder with layering 12 mm bladder stone. Stomach/Bowel: Normal non-distended stomach. Normal caliber small bowel with no small bowel wall thickening. Normal appendix. Normal large bowel with no diverticulosis, large bowel wall thickening or pericolonic fat stranding. Vascular/Lymphatic: Atherosclerotic  nonaneurysmal abdominal aorta. Top-normal bilateral inguinal lymph nodes measuring up to 1.2 cm on the left (series 2/image 125) are unchanged since 2011 CT. No pathologically enlarged lymph nodes in the abdomen or pelvis. Reproductive: Status post prostatectomy. No discrete mass or fluid collection in the prostatectomy bed. Other: No pneumoperitoneum, ascites or focal fluid collection. Moderate fat containing periumbilical hernia, minimally increased from 2011 CT. Musculoskeletal: No aggressive appearing focal osseous lesions. IMPRESSION: 1. No definite findings of metastatic disease in the chest, abdomen or pelvis. 2. Three scattered solid pulmonary nodules, largest 5 mm, indeterminate. Recommend attention on follow-up chest CT in 3 months given the absence of prior scans for comparison. 3. Bladder stone measuring 12 mm. Nonobstructing right nephrolithiasis. No hydronephrosis. 4. Aortic Atherosclerosis (ICD10-I70.0). Electronically Signed By: Ilona Sorrel M.D. On: 03/05/2019 13:01     04/11/21 10/04/20 03/30/20 01/08/20 10/01/19 07/03/19 02/20/19 02/14/19  PSA  Total PSA <0.015 ng/mL <0.015 ng/mL <0.015 ng/mL <0.015 ng/mL <0.015 ng/mL 0.067 ng/mL 0.69 ng/mL 0.7 ng/dl    PROCEDURES:         Flexible Cystoscopy - 52000  Risks, benefits, and some of the potential complications of the procedure were discussed at length with the patient including infection, bleeding, voiding discomfort, urinary retention, fever, chills, sepsis, and others. All questions were answered. Informed consent was obtained. Antibiotic prophylaxis was given. Sterile technique and intraurethral analgesia were used.  Meatus:  Normal size. Normal location. Normal condition.  Urethra:  No strictures.  External Sphincter:  Normal.  Verumontanum:  Normal.  Prostate:  Non-obstructing. No hyperplasia.  Bladder Neck:  Non-obstructing.  Ureteral Orifices:  Normal location. Normal size. Normal shape.  Effluxed clear urine.  Bladder:  One  large stone measuring 3 cm. No trabeculation. No tumors. Normal mucosa.      The lower urinary tract was carefully examined. The procedure was well-tolerated and without complications. Antibiotic instructions were given. Instructions were given to call the office immediately for bloody urine, difficulty urinating, urinary retention, painful or frequent urination, fever, chills, nausea, vomiting or other illness. The patient stated that he understood these instructions and would comply with them.         Urinalysis Dipstick Dipstick Cont'd  Color: Yellow Bilirubin: Neg mg/dL  Appearance: Clear Ketones: Neg mg/dL  Specific Gravity: 1.010 Blood: Neg ery/uL  pH: 5.5 Protein: Neg mg/dL  Glucose: Neg mg/dL Urobilinogen: 0.2 mg/dL    Nitrites: Neg    Leukocyte Esterase: Neg leu/uL    ASSESSMENT:      ICD-10 Details  1 GU:   Prostate Cancer - C61 Chronic, Improving  2   Bladder Stone - N21.0 Undiagnosed New Problem  3   Gross hematuria - R31.0 Chronic, Resolved   PLAN:           Schedule Labs: 3 Months - PSA    6 Months - PSA  Return Visit/Planned Activity: 6 Months - Office Visit, Follow up MD  Return Visit/Planned Activity: Next Available Appointment - Schedule Surgery          Document Letter(s):  Created for Patient: Clinical Summary   Created for Iona Beard, MD         Notes:   -Cystoscopy today revealed a 3 cm bladder stone, which is likely the source of his recent episode of gross hematuria. Plan for cystolitholapaxy in the coming weeks. The risk, benefits and alternatives including, but not limited to bleeding, urinary tract infection, bladder injury, urethral stricture formation, bladder stone recurrence, MI, CVA, DVT and the inherent risk of general anesthesia. He voices understanding and wishes to proceed.  -Due to his persistently undetectable PSA, will start intermittent ADT. Plan for PSA q 3 months

## 2021-05-18 NOTE — Op Note (Signed)
Operative Note  Preoperative diagnosis:  1.  3 cm bladder stone  Postoperative diagnosis: 1.  3 cm bladder stone  Procedure(s): 1.  Cystolitholapaxy  Surgeon: Ellison Hughs, MD  Assistants:  None  Anesthesia:  General  Complications:  None  EBL: Less than 5 mL  Specimens: 1.  Bladder stone  Drains/Catheters: 1.  None  Intraoperative findings:   1. 3 cm bladder stone 2. Prostate surgically absent 3. No other intravesical abnormalities  Indication:  [name] is a 70 y.o. male with a history of prostate cancer, status post De[place]re Ala in 2011 with subsequent biochemical recurrence requiring salvage EBRT in 2020.  Patient presented to the office with recurrent episodes of painless gross hematuria.  Flexible cystoscopy revealed a 3 cm bladder stone.  The patient has been consented for the above procedures, voices understanding and wishes to proceed.  Description of procedure:  After informed consent was obtained, the patient was brought to the operating room and general LMA anesthesia was administered. The patient was then placed in the dorsolithotomy position and prepped and draped in the usual sterile fashion. A timeout was performed. A 23 French rigid cystoscope was then inserted into the urethral meatus and advanced into the bladder under direct vision. A complete bladder survey revealed a 3 cm bladder stone with no intravesical pathology.  A 13 m holmium laser was then used to fracture the stone into numerous smaller pieces that were eventually siphoned out of the bladder through the sheath of the cystoscope.  Reinspection of the bladder revealed no evidence of trauma or residual stone burden.  The patient's bladder was drained.  He tolerated the procedure well and was transferred to the postanesthesia in stable condition.  Plan: Follow-up in July for PSA check

## 2021-05-18 NOTE — Anesthesia Postprocedure Evaluation (Signed)
Anesthesia Post Note  Patient: [name]  Procedure(s) Performed: CYSTOSCOPY WITH LITHOLAPAXY (N/A Bladder)     Patient location during evaluation: PACU Anesthesia Type: General Level of consciousness: awake and alert and oriented Pain management: pain level controlled Vital Signs Assessment: post-procedure vital signs reviewed and stable Respiratory status: spontaneous breathing, nonlabored ventilation and respiratory function stable Cardiovascular status: blood pressure returned to baseline Postop Assessment: no apparent nausea or vomiting Anesthetic complications: no   No complications documented.  Last Vitals:  Vitals:   05/18/21 1245 05/18/21 1300  BP: 139/70 (!) 152/68  Pulse: (!) 59 (!) 59  Resp: 18 15  Temp:  (!) 36.4 C  SpO2: 98% 99%    Last Pain:  Vitals:   05/18/21 1245  TempSrc:   PainSc: 0-No pain                 Brennan Bailey

## 2021-05-18 NOTE — Anesthesia Procedure Notes (Signed)
Procedure Name: LMA Insertion Date/Time: 05/18/2021 11:50 AM Performed by: Rogers Blocker, CRNA Pre-anesthesia Checklist: Patient identified, Emergency Drugs available, Suction available and Patient being monitored Patient Re-evaluated:Patient Re-evaluated prior to induction Oxygen Delivery Method: Circle system utilized Preoxygenation: Pre-oxygenation with 100% oxygen Induction Type: IV induction Ventilation: Mask ventilation without difficulty LMA: LMA inserted LMA Size: 5.0 Number of attempts: 1 Airway Equipment and Method: Bite block Placement Confirmation: positive ETCO2 Tube secured with: Tape Dental Injury: Teeth and Oropharynx as per pre-operative assessment

## 2021-05-18 NOTE — Anesthesia Preprocedure Evaluation (Addendum)
Anesthesia Evaluation  Patient identified by MRN, date of birth, ID band Patient awake    Reviewed: Allergy & Precautions, NPO status , Patient's Chart, lab work & pertinent test results  History of Anesthesia Complications Negative for: history of anesthetic complications  Airway Mallampati: II  TM Distance: >3 FB Neck ROM: Full    Dental  (+) Edentulous Upper,    Pulmonary Current Smoker and Patient abstained from smoking.,    Pulmonary exam normal        Cardiovascular hypertension, Pt. on medications Normal cardiovascular exam     Neuro/Psych negative neurological ROS  negative psych ROS   GI/Hepatic Neg liver ROS, GERD  Medicated and Controlled,  Endo/Other  negative endocrine ROS  Renal/GU negative Renal ROS   Bladder stone    Musculoskeletal negative musculoskeletal ROS (+)   Abdominal   Peds  Hematology negative hematology ROS (+)   Anesthesia Other Findings Day of surgery medications reviewed with patient.  Reproductive/Obstetrics negative OB ROS                            Anesthesia Physical Anesthesia Plan  ASA: II  Anesthesia Plan: General   Post-op Pain Management:    Induction: Intravenous  PONV Risk Score and Plan: 2 and Treatment may vary due to age or medical condition, Ondansetron and Dexamethasone  Airway Management Planned: LMA  Additional Equipment: None  Intra-op Plan:   Post-operative Plan: Extubation in OR  Informed Consent: I have reviewed the patients History and Physical, chart, labs and discussed the procedure including the risks, benefits and alternatives for the proposed anesthesia with the patient or authorized representative who has indicated his/her understanding and acceptance.     Dental advisory given  Plan Discussed with: CRNA  Anesthesia Plan Comments:        Anesthesia Quick Evaluation

## 2021-05-18 NOTE — Transfer of Care (Signed)
Immediate Anesthesia Transfer of Care Note  Patient: MUBASHIR MALLEK  Procedure(s) Performed: CYSTOSCOPY WITH LITHOLAPAXY (N/A Bladder)  Patient Location: PACU  Anesthesia Type:General  Level of Consciousness: awake, alert  and oriented  Airway & Oxygen Therapy: Patient Spontanous Breathing and Patient connected to nasal cannula oxygen  Post-op Assessment: Report given to RN  Post vital signs: Reviewed and stable  Last Vitals:  Vitals Value Taken Time  BP 163/71   Temp    Pulse 67 05/18/21 1234  Resp    SpO2 99 % 05/18/21 1234  Vitals shown include unvalidated device data.  Last Pain:  Vitals:   05/18/21 0943  TempSrc: Oral  PainSc: 0-No pain      Patients Stated Pain Goal: 5 (10/27/14 9458)  Complications: No complications documented.

## 2021-05-18 NOTE — Discharge Instructions (Signed)
CYSTOSCOPY HOME CARE INSTRUCTIONS  Activity: Rest for the remainder of the day.  Do not drive or operate equipment today.  You may resume normal activities in one to two days as instructed by your physician.   Meals: Drink plenty of liquids and eat light foods such as gelatin or soup this evening.  You may return to a normal meal plan tomorrow.  Return to Work: You may return to work in one to two days or as instructed by your physician.  Special Instructions / Symptoms: Call your physician if any of these symptoms occur:   -persistent or heavy bleeding  -bleeding which continues after first few urination  -large blood clots that are difficult to pass  -urine stream diminishes or stops completely  -fever equal to or higher than 101 degrees Farenheit.  -cloudy urine with a strong, foul odor  -severe pain  Females should always wipe from front to back after elimination.  You may feel some burning pain when you urinate.  This should disappear with time.  Applying moist heat to the lower abdomen or a hot tub bath may help relieve the pain. \     Post Anesthesia Home Care Instructions  Activity: Get plenty of rest for the remainder of the day. A responsible individual must stay with you for 24 hours following the procedure.  For the next 24 hours, DO NOT: -Drive a car -Paediatric nurse -Drink alcoholic beverages -Take any medication unless instructed by your physician -Make any legal decisions or sign important papers.  Meals: Start with liquid foods such as gelatin or soup. Progress to regular foods as tolerated. Avoid greasy, spicy, heavy foods. If nausea and/or vomiting occur, drink only clear liquids until the nausea and/or vomiting subsides. Call your physician if vomiting continues.  Special Instructions/Symptoms: Your throat may feel dry or sore from the anesthesia or the breathing tube placed in your throat during surgery. If this causes discomfort, gargle with warm salt  water. The discomfort should disappear within 24 hours.   Post Anesthesia Home Care Instructions  Activity: Get plenty of rest for the remainder of the day. A responsible individual must stay with you for 24 hours following the procedure.  For the next 24 hours, DO NOT: -Drive a car -Paediatric nurse -Drink alcoholic beverages -Take any medication unless instructed by your physician -Make any legal decisions or sign important papers.  Meals: Start with liquid foods such as gelatin or soup. Progress to regular foods as tolerated. Avoid greasy, spicy, heavy foods. If nausea and/or vomiting occur, drink only clear liquids until the nausea and/or vomiting subsides. Call your physician if vomiting continues.  Special Instructions/Symptoms: Your throat may feel dry or sore from the anesthesia or the breathing tube placed in your throat during surgery. If this causes discomfort, gargle with warm salt water. The discomfort should disappear within 24 hours.

## 2021-05-19 ENCOUNTER — Encounter (HOSPITAL_BASED_OUTPATIENT_CLINIC_OR_DEPARTMENT_OTHER): Payer: Self-pay | Admitting: Urology

## 2021-05-19 NOTE — Addendum Note (Signed)
Addendum  created 05/19/21 0928 by Rogers Blocker, CRNA   Charge Capture section accepted

## 2021-09-05 DIAGNOSIS — K219 Gastro-esophageal reflux disease without esophagitis: Secondary | ICD-10-CM | POA: Diagnosis not present

## 2021-09-05 DIAGNOSIS — I10 Essential (primary) hypertension: Secondary | ICD-10-CM | POA: Diagnosis not present

## 2021-09-05 DIAGNOSIS — K429 Umbilical hernia without obstruction or gangrene: Secondary | ICD-10-CM | POA: Diagnosis not present

## 2021-09-05 DIAGNOSIS — Z87891 Personal history of nicotine dependence: Secondary | ICD-10-CM | POA: Diagnosis not present

## 2021-10-24 DIAGNOSIS — R310 Gross hematuria: Secondary | ICD-10-CM | POA: Diagnosis not present

## 2021-10-24 DIAGNOSIS — N210 Calculus in bladder: Secondary | ICD-10-CM | POA: Diagnosis not present

## 2022-01-02 DIAGNOSIS — E876 Hypokalemia: Secondary | ICD-10-CM | POA: Diagnosis not present

## 2022-01-02 DIAGNOSIS — R739 Hyperglycemia, unspecified: Secondary | ICD-10-CM | POA: Diagnosis not present

## 2022-01-02 DIAGNOSIS — E7849 Other hyperlipidemia: Secondary | ICD-10-CM | POA: Diagnosis not present

## 2022-01-02 DIAGNOSIS — I10 Essential (primary) hypertension: Secondary | ICD-10-CM | POA: Diagnosis not present

## 2022-01-02 DIAGNOSIS — E786 Lipoprotein deficiency: Secondary | ICD-10-CM | POA: Diagnosis not present

## 2022-02-06 DIAGNOSIS — K429 Umbilical hernia without obstruction or gangrene: Secondary | ICD-10-CM | POA: Diagnosis not present

## 2022-02-06 DIAGNOSIS — E785 Hyperlipidemia, unspecified: Secondary | ICD-10-CM | POA: Diagnosis not present

## 2022-02-06 DIAGNOSIS — I10 Essential (primary) hypertension: Secondary | ICD-10-CM | POA: Diagnosis not present

## 2022-02-06 DIAGNOSIS — K219 Gastro-esophageal reflux disease without esophagitis: Secondary | ICD-10-CM | POA: Diagnosis not present

## 2022-02-09 ENCOUNTER — Other Ambulatory Visit: Payer: Self-pay

## 2022-02-09 ENCOUNTER — Ambulatory Visit: Payer: Medicare Other | Admitting: Orthopedic Surgery

## 2022-02-09 ENCOUNTER — Ambulatory Visit: Payer: Medicare Other

## 2022-02-09 ENCOUNTER — Encounter: Payer: Self-pay | Admitting: Orthopedic Surgery

## 2022-02-09 VITALS — BP 139/63 | HR 88 | Ht 72.0 in | Wt 220.0 lb

## 2022-02-09 DIAGNOSIS — G8929 Other chronic pain: Secondary | ICD-10-CM

## 2022-02-09 DIAGNOSIS — M722 Plantar fascial fibromatosis: Secondary | ICD-10-CM

## 2022-02-09 DIAGNOSIS — M7731 Calcaneal spur, right foot: Secondary | ICD-10-CM

## 2022-02-09 DIAGNOSIS — M79671 Pain in right foot: Secondary | ICD-10-CM

## 2022-02-09 NOTE — Progress Notes (Signed)
Chief Complaint  Patient presents with   Foot Pain    Right for several months / heel     71 year old male presents with several month history of pain in his right heel which is now involving his entire foot  He seems to have pain on the plantar aspect of the foot over the calcaneus with no history of trauma  He did try some inserts and cushions in the shoe which help at times but then seems to wane as he continues to ambulate  System review heartburn joint pain all others negative  Past Surgical History:  Procedure Laterality Date   COLONOSCOPY N/A 08/25/2020   Procedure: COLONOSCOPY;  Surgeon: Daneil Dolin, MD;  [facility];  Service: Endoscopy;  Laterality: N/A;  9:30   COLONOSCOPY     CYSTOSCOPY WITH LITHOLAPAXY N/A 05/18/2021   Procedure: CYSTOSCOPY WITH LITHOLAPAXY;  Surgeon: Ceasar Mons, MD;  Location: [facility];  Service: Urology;  Laterality: N/A;  ONLY NEEDS  30 MIN   POLYPECTOMY  08/25/2020   Procedure: POLYPECTOMY;  Surgeon: Daneil Dolin, MD;  [facility];  Service: Endoscopy;;   ROBOT ASSISTED LAPAROSCOPIC RADICAL PROSTATECTOMY  2011   TONSILLECTOMY  age 45   and adenoids removed    BP 139/63    Pulse 88    Ht 6' (1.829 m)    Wt 220 lb (99.8 kg)    BMI 29.84 kg/m   He is awake alert and oriented x3  Mood and affect are normal  Cardiovascular exam good color capillary refill and pulse to the right foot  Sensory exam normal  Exam of the foot shows that he has slight flattening of the arch tenderness in the calcaneus on the plantar aspect nontender over the Achilles tendon ankle joint is stable no atrophy is seen in the foot  X-ray shows the foot looks normal except for the plantar spur the midfoot looks good he has good calcaneal pitch  Diagnosis Encounter Diagnoses  Name Primary?   Chronic heel pain, right    Heel spur, right Yes     Treatment  We will go the full court press here he is already  tried the over-the-counter orthotics including Tuli heel cups  So we will get a semicustom over-the-counter orthotic for the entire foot to help with his overall foot pain  We will treat the plantar fasciitis with appropriate cryotherapy and stretching exercises and cortisone injection  Follow-up in a month to 6 weeks  He should wear supportive shoes  Patient education handout given as well  Injection plantar fascia right foot  The patient was in the supine position he gave consent for injection of his right heel this was confirmed with the timeout  A long 25-gauge needle was used to inject the plantar fascia just distal to the insertion site.  Alcohol was used to prep the skin along with ethyl chloride to freeze and anesthetized.  The injection was performed without complication

## 2022-03-06 DIAGNOSIS — I10 Essential (primary) hypertension: Secondary | ICD-10-CM | POA: Diagnosis not present

## 2022-03-13 ENCOUNTER — Other Ambulatory Visit: Payer: Self-pay

## 2022-03-13 ENCOUNTER — Ambulatory Visit: Payer: Medicare Other | Admitting: Orthopedic Surgery

## 2022-03-13 ENCOUNTER — Telehealth: Payer: Self-pay

## 2022-03-13 DIAGNOSIS — M7731 Calcaneal spur, right foot: Secondary | ICD-10-CM | POA: Diagnosis not present

## 2022-03-13 DIAGNOSIS — G8929 Other chronic pain: Secondary | ICD-10-CM

## 2022-03-13 NOTE — Progress Notes (Signed)
FOLLOW UP  ? ?Encounter Diagnosis  ?Name Primary?  ? Heel spur, right Yes  ? ? ? ?Chief Complaint  ?Patient presents with  ? Foot Pain  ?  Right, heel is not bothering me but my foot still hurts and toes feel heavy  ? ? ? ?[name] is here for follow-up he had a right heel spur injected and he is doing well from that however he is continuing to complain of a knot on the bottom of his feet ? ?Reexamination of the feet shows he has calluses forming under the second digit and perhaps the fifth ? ?I referred him to podiatry for that no further treatment needed at this time for his heel spur which has improved ? ? ?

## 2022-03-13 NOTE — Telephone Encounter (Signed)
Please advise. Thank you

## 2022-03-13 NOTE — Telephone Encounter (Signed)
Referral placed to [facility]. Patient aware and in agreement with treatment plan. ?

## 2022-03-13 NOTE — Telephone Encounter (Signed)
Patient called stating that Dr. Caprice Beaver does not take his insurance [facility]).  ?He is asking if Dr. Aline Brochure could suggest another doctor. ?His number is [phone], please call him. ?

## 2022-03-13 NOTE — Telephone Encounter (Signed)
You can look up any podiatrist in the area under [employer] and send him to that doctor

## 2022-03-13 NOTE — Patient Instructions (Addendum)
Give phone number for Dr Marcheta Grammes [facility] [place]  ?

## 2022-03-27 ENCOUNTER — Ambulatory Visit (INDEPENDENT_AMBULATORY_CARE_PROVIDER_SITE_OTHER): Payer: Medicare Other

## 2022-03-27 ENCOUNTER — Ambulatory Visit: Payer: Medicare Other | Admitting: Podiatry

## 2022-03-27 DIAGNOSIS — M722 Plantar fascial fibromatosis: Secondary | ICD-10-CM

## 2022-03-27 MED ORDER — METHYLPREDNISOLONE 4 MG PO TBPK: ORAL_TABLET | ORAL | 0 refills | Status: DC

## 2022-03-27 MED ORDER — MELOXICAM 15 MG PO TABS: 15.0000 mg | ORAL_TABLET | Freq: Every day | ORAL | 1 refills | Status: DC

## 2022-03-27 MED ORDER — BETAMETHASONE SOD PHOS & ACET 6 (3-3) MG/ML IJ SUSP: 3.0000 mg | Freq: Once | INTRAMUSCULAR | Status: DC

## 2022-03-27 NOTE — Progress Notes (Signed)
? ?  Subjective: ?71 y.o. male as a new patient referral  for evaluation of pain and tenderness to the right heel.  Patient states that he had a heel injection about 1 month ago for right heel pain.  It did improve but he continues to have pain and tenderness to the right heel.  He is currently wearing some super feet insoles.  He denies a history of injury.  This is been ongoing for several months now.  He presents for further treatment and evaluation ? ? ?Past Medical History:  ?Diagnosis Date  ? GERD (gastroesophageal reflux disease)   ? History of kidney stones   ? Hyperlipidemia   ? Hypertension   ? Prostate cancer [facility]) 2011  ? radiation june 2020  ? Wears dentures   ? full  ? Wears glasses   ? Wears partial dentures   ? lower  ? ? ? ?Objective: ?Physical Exam ?General: The patient is alert and oriented x3 in no acute distress. ? ?Dermatology: Skin is warm, dry and supple bilateral lower extremities. Negative for open lesions or macerations bilateral.  ? ?Vascular: Dorsalis Pedis and Posterior Tibial pulses palpable bilateral.  Capillary fill time is immediate to all digits. ? ?Neurological: Epicritic and protective threshold intact bilateral.  ? ?Musculoskeletal: Tenderness to palpation to the plantar aspect of the right heel along the plantar fascia. All other joints range of motion within normal limits bilateral. Strength 5/5 in all groups bilateral.  ? ?Radiographic exam: ?Normal osseous mineralization. Joint spaces preserved. No fracture/dislocation/boney destruction. No other soft tissue abnormalities or radiopaque foreign bodies.  Plantar heel spur noted on lateral view ? ?Assessment: ?1. Plantar fasciitis right ?2.  Plantar heel spur right ? ?Plan of Care:  ?1. Patient evaluated. Xrays reviewed.   ?2. Injection of 0.5cc Celestone soluspan injected into the right plantar fascia  ?3. Rx for Medrol Dose Pack placed ?4. Rx for Meloxicam ordered for patient. ?5. Plantar fascial band(s) dispensed ?6.   Continue OTC super feet insoles ?7. Return to clinic in 4 weeks.   ? ?*Works at [employer] in [place] ? ? ?Edrick Kins, DPM ?[place] ? ?Dr. Edrick Kins, DPM  ?  ?[address]                                        ?[place], [place] [address]                ?Office [phone]  ?Fax [phone] ? ? ? ? ?

## 2022-04-10 DIAGNOSIS — I10 Essential (primary) hypertension: Secondary | ICD-10-CM | POA: Diagnosis not present

## 2022-04-20 DIAGNOSIS — N210 Calculus in bladder: Secondary | ICD-10-CM | POA: Diagnosis not present

## 2022-05-01 ENCOUNTER — Encounter: Payer: Self-pay | Admitting: Podiatry

## 2022-05-01 ENCOUNTER — Ambulatory Visit: Payer: Medicare Other | Admitting: Podiatry

## 2022-05-01 DIAGNOSIS — M722 Plantar fascial fibromatosis: Secondary | ICD-10-CM

## 2022-05-01 DIAGNOSIS — G5791 Unspecified mononeuropathy of right lower limb: Secondary | ICD-10-CM | POA: Diagnosis not present

## 2022-05-01 MED ORDER — GABAPENTIN 100 MG PO CAPS: 100.0000 mg | ORAL_CAPSULE | Freq: Three times a day (TID) | ORAL | 3 refills | Status: DC

## 2022-05-01 NOTE — Progress Notes (Signed)
? ?  Subjective: ?71 y.o. male presenting for follow-up evaluation of right heel and foot pain.  Patient states that he continues to have pain throughout the entire foot.  The heel actually feels better.  He presents for further treatment and evaluation ? ? ?Past Medical History:  ?Diagnosis Date  ? GERD (gastroesophageal reflux disease)   ? History of kidney stones   ? Hyperlipidemia   ? Hypertension   ? Prostate cancer [facility]) 2011  ? radiation june 2020  ? Wears dentures   ? full  ? Wears glasses   ? Wears partial dentures   ? lower  ? ?Past Surgical History:  ?Procedure Laterality Date  ? COLONOSCOPY N/A 08/25/2020  ? Procedure: COLONOSCOPY;  Surgeon: Daneil Dolin, MD;  [facility];  Service: Endoscopy;  Laterality: N/A;  9:30  ? COLONOSCOPY    ? CYSTOSCOPY WITH LITHOLAPAXY N/A 05/18/2021  ? Procedure: CYSTOSCOPY WITH LITHOLAPAXY;  Surgeon: Ceasar Mons, MD;  Location: [facility];  Service: Urology;  Laterality: N/A;  ONLY NEEDS  30 MIN  ? POLYPECTOMY  08/25/2020  ? Procedure: POLYPECTOMY;  Surgeon: Daneil Dolin, MD;  [facility];  Service: Endoscopy;;  ? ROBOT ASSISTED LAPAROSCOPIC RADICAL PROSTATECTOMY  2011  ? TONSILLECTOMY  age 29  ? and adenoids removed  ? ?Allergies  ?Allergen Reactions  ? Shellfish Allergy Swelling  ? ? ? ?Objective: ?Physical Exam ?General: The patient is alert and oriented x3 in no acute distress. ? ?Dermatology: Skin is warm, dry and supple bilateral lower extremities. Negative for open lesions or macerations bilateral.  ? ?Vascular: Dorsalis Pedis and Posterior Tibial pulses palpable bilateral.  Capillary fill time is immediate to all digits.  Clinically there is no concern for vascular compromise ? ?Neurological: Epicritic and protective threshold intact bilateral.  ? ?Musculoskeletal: Significantly improved tenderness to palpation to the plantar aspect of the right heel along the plantar fascia.  Today the patient seems to have  generalized foot pain throughout the entire foot.  All other joints range of motion within normal limits bilateral. Strength 5/5 in all groups bilateral.  ? ? ?Assessment: ?1. Plantar fasciitis right; improved ?2.  Plantar heel spur right ?3.  Generalized foot pain right ? ?Plan of Care:  ?1. Patient evaluated.  ?2.  Continue meloxicam 15 mg daily ?3.  Prescription for gabapentin 100 mg 3 times daily to help alleviate some of the patient's generalized foot pain.  Potentially this will have a compounding effect with the meloxicam ?4.  Continue wearing good supportive shoes and sneakers ?5.  Patient states that the plantar fascial brace actually causes pain to the outside of the foot.  Patient may discontinue ?6.  Return to clinic as needed ? ?*Works at [employer] in [place] ? ? ?Edrick Kins, DPM ?[place] ? ?Dr. Edrick Kins, DPM  ?  ?[address]                                        ?[place], [place] [address]                ?Office [phone]  ?Fax [phone] ? ? ? ? ?

## 2022-05-30 ENCOUNTER — Other Ambulatory Visit: Payer: Self-pay | Admitting: Podiatry

## 2022-05-31 NOTE — Telephone Encounter (Signed)
Please advise 

## 2022-07-04 ENCOUNTER — Ambulatory Visit: Payer: Medicare Other | Admitting: Podiatry

## 2022-07-04 DIAGNOSIS — M722 Plantar fascial fibromatosis: Secondary | ICD-10-CM | POA: Diagnosis not present

## 2022-07-04 MED ORDER — MELOXICAM 15 MG PO TABS: 15.0000 mg | ORAL_TABLET | Freq: Every day | ORAL | 2 refills | Status: DC

## 2022-07-04 MED ORDER — BETAMETHASONE SOD PHOS & ACET 6 (3-3) MG/ML IJ SUSP
3.0000 mg | Freq: Once | INTRAMUSCULAR | Status: AC
  Administered 2022-07-04: 3 mg via INTRA_ARTICULAR

## 2022-07-04 NOTE — Progress Notes (Signed)
   Subjective: 71 y.o. male presenting for follow-up evaluation of right heel and foot pain.  Patient states that the medication of the meloxicam and gabapentin do help with some of the pain.  He recently purchased a pair of Ortho feet shoes online and he says they also help slightly.  He continues to have some pain on a daily basis however.  He says the injections in the past have helped   Past Medical History:  Diagnosis Date   GERD (gastroesophageal reflux disease)    History of kidney stones    Hyperlipidemia    Hypertension    Prostate cancer [facility]) 2011   radiation june 2020   Wears dentures    full   Wears glasses    Wears partial dentures    lower   Past Surgical History:  Procedure Laterality Date   COLONOSCOPY N/A 08/25/2020   Procedure: COLONOSCOPY;  Surgeon: Daneil Dolin, MD;  [facility];  Service: Endoscopy;  Laterality: N/A;  9:30   COLONOSCOPY     CYSTOSCOPY WITH LITHOLAPAXY N/A 05/18/2021   Procedure: CYSTOSCOPY WITH LITHOLAPAXY;  Surgeon: Ceasar Mons, MD;  Location: [facility];  Service: Urology;  Laterality: N/A;  ONLY NEEDS  30 MIN   POLYPECTOMY  08/25/2020   Procedure: POLYPECTOMY;  Surgeon: Daneil Dolin, MD;  [facility];  Service: Endoscopy;;   ROBOT ASSISTED LAPAROSCOPIC RADICAL PROSTATECTOMY  2011   TONSILLECTOMY  age 89   and adenoids removed   Allergies  Allergen Reactions   Shellfish Allergy Swelling     Objective: Physical Exam General: The patient is alert and oriented x3 in no acute distress.  Dermatology: Skin is warm, dry and supple bilateral lower extremities. Negative for open lesions or macerations bilateral.   Vascular: Dorsalis Pedis and Posterior Tibial pulses palpable bilateral.  Capillary fill time is immediate to all digits.  Clinically there is no concern for vascular compromise  Neurological: Epicritic and protective threshold intact bilateral.   Musculoskeletal: There  continues to be some tenderness to palpation to the plantar aspect of the right heel along the plantar fascia and generalized foot pain throughout the foot.  Assessment: 1. Plantar fasciitis right; improved 2.  Plantar heel spur right 3.  Generalized foot pain right  Plan of Care:  1. Patient evaluated.  2.  Injection of 0.5 cc Celestone Soluspan injected along the plantar fascia right 3.  Continue meloxicam 15 mg daily as needed.  Refill provided 4.  Continue gabapentin 100 mg nightly. 5.  Continue Ortho feet shoes that the patient recently purchased with insoles 6.  Return to clinic as needed  *Works at [employer] in [place], DPM [facility]  Dr. Edrick Kins, DPM    [address], [place] [address]                Office [phone]  Fax [phone]

## 2022-07-10 DIAGNOSIS — I10 Essential (primary) hypertension: Secondary | ICD-10-CM | POA: Diagnosis not present

## 2022-07-10 DIAGNOSIS — K429 Umbilical hernia without obstruction or gangrene: Secondary | ICD-10-CM | POA: Diagnosis not present

## 2022-07-10 DIAGNOSIS — K219 Gastro-esophageal reflux disease without esophagitis: Secondary | ICD-10-CM | POA: Diagnosis not present

## 2022-07-10 DIAGNOSIS — E785 Hyperlipidemia, unspecified: Secondary | ICD-10-CM | POA: Diagnosis not present

## 2022-09-25 ENCOUNTER — Ambulatory Visit: Payer: Medicare Other | Admitting: Podiatry

## 2022-09-25 ENCOUNTER — Encounter: Payer: Self-pay | Admitting: Podiatry

## 2022-09-25 DIAGNOSIS — M722 Plantar fascial fibromatosis: Secondary | ICD-10-CM

## 2022-09-25 MED ORDER — BETAMETHASONE SOD PHOS & ACET 6 (3-3) MG/ML IJ SUSP: 3.0000 mg | Freq: Once | INTRAMUSCULAR | Status: DC

## 2022-09-25 NOTE — Progress Notes (Signed)
Chief Complaint  Patient presents with   Foot Pain    Patient is here for right foot pain, he states that it seems worse, patient states that he is taking the prescription that was given and it is not helping.    Subjective: 71 y.o. male presenting for follow-up evaluation of right heel and foot pain.  Patient states that he continues to have pain and tenderness diffusely throughout the foot.  His most focal area of pain is along the plantar fascia and lateral aspect of the foot.  Again, he denies a history of injury.  He says the cortisone injection did help temporarily   Past Medical History:  Diagnosis Date   GERD (gastroesophageal reflux disease)    History of kidney stones    Hyperlipidemia    Hypertension    Prostate cancer [facility]) 2011   radiation june 2020   Wears dentures    full   Wears glasses    Wears partial dentures    lower   Past Surgical History:  Procedure Laterality Date   COLONOSCOPY N/A 08/25/2020   Procedure: COLONOSCOPY;  Surgeon: Daneil Dolin, MD;  [facility];  Service: Endoscopy;  Laterality: N/A;  9:30   COLONOSCOPY     CYSTOSCOPY WITH LITHOLAPAXY N/A 05/18/2021   Procedure: CYSTOSCOPY WITH LITHOLAPAXY;  Surgeon: Ceasar Mons, MD;  Location: [facility];  Service: Urology;  Laterality: N/A;  ONLY NEEDS  30 MIN   POLYPECTOMY  08/25/2020   Procedure: POLYPECTOMY;  Surgeon: Daneil Dolin, MD;  [facility];  Service: Endoscopy;;   ROBOT ASSISTED LAPAROSCOPIC RADICAL PROSTATECTOMY  2011   TONSILLECTOMY  age 79   and adenoids removed   Allergies  Allergen Reactions   Shellfish Allergy Swelling     Objective: Physical Exam General: The patient is alert and oriented x3 in no acute distress.  Dermatology: Skin is warm, dry and supple bilateral lower extremities. Negative for open lesions or macerations bilateral.   Vascular: Dorsalis Pedis and Posterior Tibial pulses palpable bilateral.  Capillary  fill time is immediate to all digits.  Clinically there is no concern for vascular compromise  Neurological: Epicritic and protective threshold intact bilateral.   Musculoskeletal: There continues to be some tenderness to palpation to the plantar aspect of the right heel along the plantar fascia and today the lateral column of the foot around the fifth metatarsal tubercle as well as generalized foot pain throughout the foot.  Assessment: 1. Plantar fasciitis right; improved 2.  Plantar heel spur right 3.  Generalized foot pain right  Plan of Care:  1. Patient evaluated.  2.  Injection of 0.5 cc Celestone Soluspan injected along the plantar fascia right as well as additional injection of 0.5 cc Celestone Soluspan to the lateral aspect of the right foot 3.  Continue meloxicam 15 mg daily as needed.  Refill provided 4.  Continue gabapentin 100 mg nightly. 5.  Cam boot dispensed.  Weightbearing as tolerated x4 weeks  *Works at [employer] in [place], [place] [facility]  Dr. Edrick Kins, DPM    [address], [place] [address]  Office [phone]  Fax [phone]

## 2022-10-30 ENCOUNTER — Telehealth: Payer: Self-pay | Admitting: *Deleted

## 2022-10-30 NOTE — Telephone Encounter (Addendum)
Patient has developed a sore on leg from wearing boot given, appointment is needed to evaluate/treat. Can an antibiotic be sent in as well?

## 2022-10-30 NOTE — Telephone Encounter (Signed)
I don't think it needs antibiotics called in unless there is evidence of infection. Please schedule f/u in office. - Dr. Amalia Hailey

## 2022-11-01 ENCOUNTER — Ambulatory Visit: Payer: Medicare Other | Admitting: Podiatry

## 2022-11-01 DIAGNOSIS — L97912 Non-pressure chronic ulcer of unspecified part of right lower leg with fat layer exposed: Secondary | ICD-10-CM | POA: Diagnosis not present

## 2022-11-01 MED ORDER — DOXYCYCLINE HYCLATE 100 MG PO TABS: 100.0000 mg | ORAL_TABLET | Freq: Two times a day (BID) | ORAL | 0 refills | Status: DC

## 2022-11-01 NOTE — Progress Notes (Signed)
   Chief Complaint  Patient presents with   Wound Check    Right lower extremity wound x 1 week. Pt states the friction from wearing the boot caused soreness, edema and pain. Pt is not on any antibiotics.     Subjective: 71 y.o. male presenting for follow-up evaluation of right heel and foot pain.  Patient states that he was wearing the cam boot over the past month but the cam boot rubbed to the anterior aspect of his leg and created an ulcer.  It has been very painful and tender ever since.  He presents for further treatment and evaluation   Past Medical History:  Diagnosis Date   GERD (gastroesophageal reflux disease)    History of kidney stones    Hyperlipidemia    Hypertension    Prostate cancer [facility]) 2011   radiation june 2020   Wears dentures    full   Wears glasses    Wears partial dentures    lower   Past Surgical History:  Procedure Laterality Date   COLONOSCOPY N/A 08/25/2020   Procedure: COLONOSCOPY;  Surgeon: Daneil Dolin, MD;  [facility];  Service: Endoscopy;  Laterality: N/A;  9:30   COLONOSCOPY     CYSTOSCOPY WITH LITHOLAPAXY N/A 05/18/2021   Procedure: CYSTOSCOPY WITH LITHOLAPAXY;  Surgeon: Ceasar Mons, MD;  Location: [facility];  Service: Urology;  Laterality: N/A;  ONLY NEEDS  30 MIN   POLYPECTOMY  08/25/2020   Procedure: POLYPECTOMY;  Surgeon: Daneil Dolin, MD;  Location: [facility];  Service: Endoscopy;;   ROBOT ASSISTED LAPAROSCOPIC RADICAL PROSTATECTOMY  2011   TONSILLECTOMY  age 3   and adenoids removed   Allergies  Allergen Reactions   Shellfish Allergy Swelling     Objective: Physical Exam General: The patient is alert and oriented x3 in no acute distress.  Dermatology: Ulcer noted anterior aspect of the right leg with a well adhered eschar.  Measures approximately 2.0 x 2.0 x 0.1.  Please see above noted photo.  Vascular: Chronic edema noted right lower extremity  Neurological: Epicritic and  protective threshold intact bilateral.   Musculoskeletal: There continues to be some tenderness to palpation to the plantar aspect of the right heel along the plantar fascia and today the lateral column of the foot around the fifth metatarsal tubercle as well as generalized foot pain throughout the foot.  Assessment: 1. Plantar fasciitis right; improved 2.  Plantar heel spur right 3.  Generalized foot pain right  Plan of Care:  1. Patient evaluated.  2.  Discontinue cam boot 3.  Continue meloxicam 15 mg daily 4.  Order placed for ABIs right lower extremity.  Patient states that most of his pain is in the evenings when he lays in bed 5.  Return to clinic 4 weeks  *Works at [employer] in [place], [place] [facility]  Dr. Edrick Kins, DPM    [address], [place] [address]                Office [phone]  Fax [phone]

## 2022-11-09 ENCOUNTER — Other Ambulatory Visit: Payer: Self-pay | Admitting: Podiatry

## 2022-11-09 DIAGNOSIS — L97912 Non-pressure chronic ulcer of unspecified part of right lower leg with fat layer exposed: Secondary | ICD-10-CM

## 2022-11-13 ENCOUNTER — Ambulatory Visit (HOSPITAL_COMMUNITY)
Admission: RE | Admit: 2022-11-13 | Discharge: 2022-11-13 | Disposition: A | Discharge status: Home / Self Care | Payer: Medicare Other | Source: Ambulatory Visit | Attending: Internal Medicine | Admitting: Internal Medicine

## 2022-11-13 DIAGNOSIS — L97912 Non-pressure chronic ulcer of unspecified part of right lower leg with fat layer exposed: Secondary | ICD-10-CM | POA: Diagnosis not present

## 2022-11-14 ENCOUNTER — Other Ambulatory Visit: Payer: Self-pay | Admitting: Podiatry

## 2022-11-14 ENCOUNTER — Telehealth: Payer: Self-pay | Admitting: *Deleted

## 2022-11-14 DIAGNOSIS — L97912 Non-pressure chronic ulcer of unspecified part of right lower leg with fat layer exposed: Secondary | ICD-10-CM

## 2022-11-14 NOTE — Telephone Encounter (Signed)
Spoke with [facility], have received referral, are processing and will contact patient for availibility(usually 24-48 hours)

## 2022-11-14 NOTE — Progress Notes (Signed)
Please notify patient that I am referring him to [facility] due to poor circulation in his right leg. Keep appt with me 01/01/22.   Also, could you please f/u with [facility] and make sure he gets scheduled hopefully sooner rather than later? Thanks, Dr. Amalia Hailey

## 2022-11-14 NOTE — Telephone Encounter (Signed)
Patient is requesting a refill of doxycycline if he wants him to continue to take, is almost out.  Please advise.

## 2022-11-14 NOTE — Progress Notes (Signed)
Faxed the referral to [facility], confirmation received-11/14/22

## 2022-11-14 NOTE — Progress Notes (Signed)
Patient is scheduled with Dr Hoyle Barr ([facility])on 11/21/22,abnormal doppler

## 2022-11-15 ENCOUNTER — Other Ambulatory Visit: Payer: Self-pay

## 2022-11-15 ENCOUNTER — Ambulatory Visit: Payer: Medicare Other | Admitting: Vascular Surgery

## 2022-11-15 ENCOUNTER — Encounter: Payer: Self-pay | Admitting: Vascular Surgery

## 2022-11-15 VITALS — BP 154/71 | HR 97 | Temp 98.4°F | Ht 72.0 in | Wt 228.2 lb

## 2022-11-15 DIAGNOSIS — I70221 Atherosclerosis of native arteries of extremities with rest pain, right leg: Secondary | ICD-10-CM

## 2022-11-15 NOTE — H&P (View-Only) (Signed)
Vascular and Vein Specialist of [place]  Patient name: [name] MRN: [id number] DOB: 1951/05/28 Sex: male  REASON FOR CONSULT: Evaluation critical limb ischemia right lower extremity  HPI: [name] is a 71 y.o. male, who is here today for evaluation of ulceration and rest pain in his right lower extremity.  He has a history of heel spurs and initially this was treated with steroid injection.  More recently he had a walking boot and feels as though this wore a blistered area on the anterior portion of his right ankle above the ankle crease.  This has been progressive and he is having rest pain as well.  He reports that he is unable to sleep at night and has to sleep in a recliner with his right leg dependent.  He does report claudication type symptoms for a number of years.  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  He has had no prior history of tissue loss.  He does not have any history of cardiac disease.  He is a long-term cigarette smoker.  Past Medical History:  Diagnosis Date   GERD (gastroesophageal reflux disease)    History of kidney stones    Hyperlipidemia    Hypertension    Prostate cancer ([place]) 2011   radiation june 2020   Wears dentures    full   Wears glasses    Wears partial dentures    lower    Family History  Problem Relation Age of Onset   Gastric cancer Mother    Prostate cancer Father    Breast cancer Neg Hx    Pancreatic cancer Neg Hx     SOCIAL HISTORY: Social History   Socioeconomic History   Marital status: Single    Spouse name: Not on file   Number of children: 1   Years of education: Not on file   Highest education level: Not on file  Occupational History    Comment: customer service   Tobacco Use   Smoking status: Some Days    Packs/day: 0.25    Years: 21.00    Total pack years: 5.25    Types: Cigarettes   Smokeless tobacco:  Never  Vaping Use   Vaping Use: Never used  Substance and Sexual Activity   Alcohol use: Never   Drug use: Never   Sexual activity: Not Currently  Other Topics Concern   Not on file  Social History Narrative   Has one son.    Social Determinants of Health   Financial Resource Strain: Not on file  Food Insecurity: Not on file  Transportation Needs: Not on file  Physical Activity: Not on file  Stress: Not on file  Social Connections: Not on file  Intimate Partner Violence: Not on file    Allergies  Allergen Reactions   Shellfish Allergy Swelling    Current Outpatient Medications  Medication Sig Dispense Refill   aspirin EC 81 MG tablet Take 81 mg by mouth daily.     atorvastatin (LIPITOR) 10 MG tablet Take 10 mg by mouth daily.     doxycycline (VIBRA-TABS) 100 MG tablet Take 1 tablet (100 mg total) by mouth 2 (two) times daily. 20 tablet 0   esomeprazole (NEXIUM) 20 MG capsule Take 20 mg by mouth daily.     gabapentin (NEURONTIN) 100 MG capsule Take 1 capsule (100 mg total) by mouth 3 (three) times daily. 90 capsule 3  lisinopril-hydrochlorothiazide (PRINZIDE,ZESTORETIC) 10-12.5 MG tablet Take 1 tablet by mouth every other day.      meloxicam (MOBIC) 15 MG tablet Take 1 tablet by mouth once daily 30 tablet 0   Multiple Vitamins-Minerals (CENTRUM SILVER PO) Take 1 tablet by mouth daily.      oxybutynin (DITROPAN-XL) 10 MG 24 hr tablet Take 10 mg by mouth daily.     Current Facility-Administered Medications  Medication Dose Route Frequency Provider Last Rate Last Admin   betamethasone acetate-betamethasone sodium phosphate (CELESTONE) injection 3 mg  3 mg Intra-articular Once Evans, Brent M, DPM       betamethasone acetate-betamethasone sodium phosphate (CELESTONE) injection 3 mg  3 mg Intra-articular Once Edrick Kins, DPM        REVIEW OF SYSTEMS:  '[X]'$  denotes positive finding, '[ ]'$  denotes negative finding Cardiac  Comments:  Chest pain or chest pressure:     Shortness of breath upon exertion:    Short of breath when lying flat:    Irregular heart rhythm:        Vascular    Pain in calf, thigh, or hip brought on by ambulation: x   Pain in feet at night that wakes you up from your sleep:  x   Blood clot in your veins:    Leg swelling:         Pulmonary    Oxygen at home:    Productive cough:     Wheezing:         Neurologic    Sudden weakness in arms or legs:     Sudden numbness in arms or legs:     Sudden onset of difficulty speaking or slurred speech:    Temporary loss of vision in one eye:     Problems with dizziness:         Gastrointestinal    Blood in stool:     Vomited blood:         Genitourinary    Burning when urinating:     Blood in urine:        Psychiatric    Major depression:         Hematologic    Bleeding problems:    Problems with blood clotting too easily:        Skin    Rashes or ulcers:        Constitutional    Fever or chills:      PHYSICAL EXAM: Vitals:   11/15/22 0905  BP: (!) 154/71  Pulse: 97  Temp: 98.4 F (36.9 C)  SpO2: 98%  Weight: 228 lb 3.2 oz (103.5 kg)  Height: 6' (1.829 m)    GENERAL: The patient is a well-nourished male, in no acute distress. The vital signs are documented above. CARDIOVASCULAR: 2+ radial and 2+ femoral pulses bilaterally.  Absent popliteal and distal pulses bilaterally PULMONARY: There is good air exchange  MUSCULOSKELETAL: There are no major deformities or cyanosis. NEUROLOGIC: No focal weakness or paresthesias are detected. SKIN: He has a dry full-thickness eschar above his anterior ankle crease on the right approximately 4 cm in diameter.  No surrounding erythema PSYCHIATRIC: The patient has a normal affect.  DATA:  Lower extremity noninvasive studies reveal ankle arm index of 0.35 on the right and 0.83 on the left.  MEDICAL ISSUES: I discussed these findings in detail with the patient.  I explained that he is at extremely high risk for limb loss  without revascularization.  I do not feel he  has adequate flow for healing this wound.  I have recommended arteriogram for further evaluation.  We will proceed with this with Dr. Donzetta Matters on 11/20/2022 at [facility].  He understands that if endovascular treatment is possible will be done at the same setting.  I did explain with his degree of ischemia and tissue loss, in all likelihood he will require bypass which will be discussed in detail following the arteriogram.  He requested that I contact his sister and provide this information which I have done, speaking with Garnetta Buddy, by telephone.   Rosetta Posner, MD FACS [facility] [phone] Pager [phone]  Note: Portions of this report may have been transcribed using voice recognition software.  Every effort has been made to ensure accuracy; however, inadvertent computerized transcription errors may still be present.

## 2022-11-15 NOTE — Progress Notes (Signed)
Vascular and Vein Specialist of [place]  Patient name: [name] MRN: [id number] DOB: 1951/10/09 Sex: male  REASON FOR CONSULT: Evaluation critical limb ischemia right lower extremity  HPI: [name] is a 71 y.o. male, who is here today for evaluation of ulceration and rest pain in his right lower extremity.  He has a history of heel spurs and initially this was treated with steroid injection.  More recently he had a walking boot and feels as though this wore a blistered area on the anterior portion of his right ankle above the ankle crease.  This has been progressive and he is having rest pain as well.  He reports that he is unable to sleep at night and has to sleep in a recliner with his right leg dependent.  He does report claudication type symptoms for a number of years.  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  He has had no prior history of tissue loss.  He does not have any history of cardiac disease.  He is a long-term cigarette smoker.  Past Medical History:  Diagnosis Date   GERD (gastroesophageal reflux disease)    History of kidney stones    Hyperlipidemia    Hypertension    Prostate cancer ([place]) 2011   radiation june 2020   Wears dentures    full   Wears glasses    Wears partial dentures    lower    Family History  Problem Relation Age of Onset   Gastric cancer Mother    Prostate cancer Father    Breast cancer Neg Hx    Pancreatic cancer Neg Hx     SOCIAL HISTORY: Social History   Socioeconomic History   Marital status: Single    Spouse name: Not on file   Number of children: 1   Years of education: Not on file   Highest education level: Not on file  Occupational History    Comment: customer service   Tobacco Use   Smoking status: Some Days    Packs/day: 0.25    Years: 21.00    Total pack years: 5.25    Types: Cigarettes   Smokeless tobacco:  Never  Vaping Use   Vaping Use: Never used  Substance and Sexual Activity   Alcohol use: Never   Drug use: Never   Sexual activity: Not Currently  Other Topics Concern   Not on file  Social History Narrative   Has one son.    Social Determinants of Health   Financial Resource Strain: Not on file  Food Insecurity: Not on file  Transportation Needs: Not on file  Physical Activity: Not on file  Stress: Not on file  Social Connections: Not on file  Intimate Partner Violence: Not on file    Allergies  Allergen Reactions   Shellfish Allergy Swelling    Current Outpatient Medications  Medication Sig Dispense Refill   aspirin EC 81 MG tablet Take 81 mg by mouth daily.     atorvastatin (LIPITOR) 10 MG tablet Take 10 mg by mouth daily.     doxycycline (VIBRA-TABS) 100 MG tablet Take 1 tablet (100 mg total) by mouth 2 (two) times daily. 20 tablet 0   esomeprazole (NEXIUM) 20 MG capsule Take 20 mg by mouth daily.     gabapentin (NEURONTIN) 100 MG capsule Take 1 capsule (100 mg total) by mouth 3 (three) times daily. 90 capsule 3  lisinopril-hydrochlorothiazide (PRINZIDE,ZESTORETIC) 10-12.5 MG tablet Take 1 tablet by mouth every other day.      meloxicam (MOBIC) 15 MG tablet Take 1 tablet by mouth once daily 30 tablet 0   Multiple Vitamins-Minerals (CENTRUM SILVER PO) Take 1 tablet by mouth daily.      oxybutynin (DITROPAN-XL) 10 MG 24 hr tablet Take 10 mg by mouth daily.     Current Facility-Administered Medications  Medication Dose Route Frequency Provider Last Rate Last Admin   betamethasone acetate-betamethasone sodium phosphate (CELESTONE) injection 3 mg  3 mg Intra-articular Once Evans, Brent M, DPM       betamethasone acetate-betamethasone sodium phosphate (CELESTONE) injection 3 mg  3 mg Intra-articular Once Edrick Kins, DPM        REVIEW OF SYSTEMS:  '[X]'$  denotes positive finding, '[ ]'$  denotes negative finding Cardiac  Comments:  Chest pain or chest pressure:     Shortness of breath upon exertion:    Short of breath when lying flat:    Irregular heart rhythm:        Vascular    Pain in calf, thigh, or hip brought on by ambulation: x   Pain in feet at night that wakes you up from your sleep:  x   Blood clot in your veins:    Leg swelling:         Pulmonary    Oxygen at home:    Productive cough:     Wheezing:         Neurologic    Sudden weakness in arms or legs:     Sudden numbness in arms or legs:     Sudden onset of difficulty speaking or slurred speech:    Temporary loss of vision in one eye:     Problems with dizziness:         Gastrointestinal    Blood in stool:     Vomited blood:         Genitourinary    Burning when urinating:     Blood in urine:        Psychiatric    Major depression:         Hematologic    Bleeding problems:    Problems with blood clotting too easily:        Skin    Rashes or ulcers:        Constitutional    Fever or chills:      PHYSICAL EXAM: Vitals:   11/15/22 0905  BP: (!) 154/71  Pulse: 97  Temp: 98.4 F (36.9 C)  SpO2: 98%  Weight: 228 lb 3.2 oz (103.5 kg)  Height: 6' (1.829 m)    GENERAL: The patient is a well-nourished male, in no acute distress. The vital signs are documented above. CARDIOVASCULAR: 2+ radial and 2+ femoral pulses bilaterally.  Absent popliteal and distal pulses bilaterally PULMONARY: There is good air exchange  MUSCULOSKELETAL: There are no major deformities or cyanosis. NEUROLOGIC: No focal weakness or paresthesias are detected. SKIN: He has a dry full-thickness eschar above his anterior ankle crease on the right approximately 4 cm in diameter.  No surrounding erythema PSYCHIATRIC: The patient has a normal affect.  DATA:  Lower extremity noninvasive studies reveal ankle arm index of 0.35 on the right and 0.83 on the left.  MEDICAL ISSUES: I discussed these findings in detail with the patient.  I explained that he is at extremely high risk for limb loss  without revascularization.  I do not feel he  has adequate flow for healing this wound.  I have recommended arteriogram for further evaluation.  We will proceed with this with Dr. Donzetta Matters on 11/20/2022 at [facility].  He understands that if endovascular treatment is possible will be done at the same setting.  I did explain with his degree of ischemia and tissue loss, in all likelihood he will require bypass which will be discussed in detail following the arteriogram.  He requested that I contact his sister and provide this information which I have done, speaking with Garnetta Buddy, by telephone.   Rosetta Posner, MD FACS [facility] and Vein Specialists of [facility] [phone] Pager [phone]  Note: Portions of this report may have been transcribed using voice recognition software.  Every effort has been made to ensure accuracy; however, inadvertent computerized transcription errors may still be present.

## 2022-11-15 NOTE — H&P (View-Only) (Signed)
Vascular and Vein Specialist of [place]  Patient name: [name] MRN: [id number] DOB: August 20, 1951 Sex: male  REASON FOR CONSULT: Evaluation critical limb ischemia right lower extremity  HPI: [name] is a 71 y.o. male, who is here today for evaluation of ulceration and rest pain in his right lower extremity.  He has a history of heel spurs and initially this was treated with steroid injection.  More recently he had a walking boot and feels as though this wore a blistered area on the anterior portion of his right ankle above the ankle crease.  This has been progressive and he is having rest pain as well.  He reports that he is unable to sleep at night and has to sleep in a recliner with his right leg dependent.  He does report claudication type symptoms for a number of years.  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  He has had no prior history of tissue loss.  He does not have any history of cardiac disease.  He is a long-term cigarette smoker.  Past Medical History:  Diagnosis Date   GERD (gastroesophageal reflux disease)    History of kidney stones    Hyperlipidemia    Hypertension    Prostate cancer ([place]) 2011   radiation june 2020   Wears dentures    full   Wears glasses    Wears partial dentures    lower    Family History  Problem Relation Age of Onset   Gastric cancer Mother    Prostate cancer Father    Breast cancer Neg Hx    Pancreatic cancer Neg Hx     SOCIAL HISTORY: Social History   Socioeconomic History   Marital status: Single    Spouse name: Not on file   Number of children: 1   Years of education: Not on file   Highest education level: Not on file  Occupational History    Comment: customer service   Tobacco Use   Smoking status: Some Days    Packs/day: 0.25    Years: 21.00    Total pack years: 5.25    Types: Cigarettes   Smokeless tobacco:  Never  Vaping Use   Vaping Use: Never used  Substance and Sexual Activity   Alcohol use: Never   Drug use: Never   Sexual activity: Not Currently  Other Topics Concern   Not on file  Social History Narrative   Has one son.    Social Determinants of Health   Financial Resource Strain: Not on file  Food Insecurity: Not on file  Transportation Needs: Not on file  Physical Activity: Not on file  Stress: Not on file  Social Connections: Not on file  Intimate Partner Violence: Not on file    Allergies  Allergen Reactions   Shellfish Allergy Swelling    Current Outpatient Medications  Medication Sig Dispense Refill   aspirin EC 81 MG tablet Take 81 mg by mouth daily.     atorvastatin (LIPITOR) 10 MG tablet Take 10 mg by mouth daily.     doxycycline (VIBRA-TABS) 100 MG tablet Take 1 tablet (100 mg total) by mouth 2 (two) times daily. 20 tablet 0   esomeprazole (NEXIUM) 20 MG capsule Take 20 mg by mouth daily.     gabapentin (NEURONTIN) 100 MG capsule Take 1 capsule (100 mg total) by mouth 3 (three) times daily. 90 capsule 3  lisinopril-hydrochlorothiazide (PRINZIDE,ZESTORETIC) 10-12.5 MG tablet Take 1 tablet by mouth every other day.      meloxicam (MOBIC) 15 MG tablet Take 1 tablet by mouth once daily 30 tablet 0   Multiple Vitamins-Minerals (CENTRUM SILVER PO) Take 1 tablet by mouth daily.      oxybutynin (DITROPAN-XL) 10 MG 24 hr tablet Take 10 mg by mouth daily.     Current Facility-Administered Medications  Medication Dose Route Frequency Provider Last Rate Last Admin   betamethasone acetate-betamethasone sodium phosphate (CELESTONE) injection 3 mg  3 mg Intra-articular Once Evans, Brent M, DPM       betamethasone acetate-betamethasone sodium phosphate (CELESTONE) injection 3 mg  3 mg Intra-articular Once Edrick Kins, DPM        REVIEW OF SYSTEMS:  '[X]'$  denotes positive finding, '[ ]'$  denotes negative finding Cardiac  Comments:  Chest pain or chest pressure:     Shortness of breath upon exertion:    Short of breath when lying flat:    Irregular heart rhythm:        Vascular    Pain in calf, thigh, or hip brought on by ambulation: x   Pain in feet at night that wakes you up from your sleep:  x   Blood clot in your veins:    Leg swelling:         Pulmonary    Oxygen at home:    Productive cough:     Wheezing:         Neurologic    Sudden weakness in arms or legs:     Sudden numbness in arms or legs:     Sudden onset of difficulty speaking or slurred speech:    Temporary loss of vision in one eye:     Problems with dizziness:         Gastrointestinal    Blood in stool:     Vomited blood:         Genitourinary    Burning when urinating:     Blood in urine:        Psychiatric    Major depression:         Hematologic    Bleeding problems:    Problems with blood clotting too easily:        Skin    Rashes or ulcers:        Constitutional    Fever or chills:      PHYSICAL EXAM: Vitals:   11/15/22 0905  BP: (!) 154/71  Pulse: 97  Temp: 98.4 F (36.9 C)  SpO2: 98%  Weight: 228 lb 3.2 oz (103.5 kg)  Height: 6' (1.829 m)    GENERAL: The patient is a well-nourished male, in no acute distress. The vital signs are documented above. CARDIOVASCULAR: 2+ radial and 2+ femoral pulses bilaterally.  Absent popliteal and distal pulses bilaterally PULMONARY: There is good air exchange  MUSCULOSKELETAL: There are no major deformities or cyanosis. NEUROLOGIC: No focal weakness or paresthesias are detected. SKIN: He has a dry full-thickness eschar above his anterior ankle crease on the right approximately 4 cm in diameter.  No surrounding erythema PSYCHIATRIC: The patient has a normal affect.  DATA:  Lower extremity noninvasive studies reveal ankle arm index of 0.35 on the right and 0.83 on the left.  MEDICAL ISSUES: I discussed these findings in detail with the patient.  I explained that he is at extremely high risk for limb loss  without revascularization.  I do not feel he  has adequate flow for healing this wound.  I have recommended arteriogram for further evaluation.  We will proceed with this with Dr. Donzetta Matters on 11/20/2022 at [facility].  He understands that if endovascular treatment is possible will be done at the same setting.  I did explain with his degree of ischemia and tissue loss, in all likelihood he will require bypass which will be discussed in detail following the arteriogram.  He requested that I contact his sister and provide this information which I have done, speaking with Garnetta Buddy, by telephone.   Rosetta Posner, MD FACS [facility] Special[facility] of [facility] [phone] Pager [phone]  Note: Portions of this report may have been transcribed using voice recognition software.  Every effort has been made to ensure accuracy; however, inadvertent computerized transcription errors may still be present.

## 2022-11-20 ENCOUNTER — Encounter (HOSPITAL_COMMUNITY)
Admission: RE | Disposition: A | Discharge status: Home / Self Care | Payer: Self-pay | Source: Home / Self Care | Attending: Vascular Surgery

## 2022-11-20 ENCOUNTER — Ambulatory Visit (HOSPITAL_COMMUNITY)
Admission: RE | Admit: 2022-11-20 | Discharge: 2022-11-20 | Disposition: A | Discharge status: Home / Self Care | Payer: Medicare Other | Attending: Vascular Surgery | Admitting: Vascular Surgery

## 2022-11-20 ENCOUNTER — Ambulatory Visit (HOSPITAL_BASED_OUTPATIENT_CLINIC_OR_DEPARTMENT_OTHER): Payer: Medicare Other

## 2022-11-20 DIAGNOSIS — F17210 Nicotine dependence, cigarettes, uncomplicated: Secondary | ICD-10-CM | POA: Insufficient documentation

## 2022-11-20 DIAGNOSIS — I70239 Atherosclerosis of native arteries of right leg with ulceration of unspecified site: Secondary | ICD-10-CM | POA: Diagnosis not present

## 2022-11-20 DIAGNOSIS — Z01810 Encounter for preprocedural cardiovascular examination: Secondary | ICD-10-CM | POA: Diagnosis not present

## 2022-11-20 DIAGNOSIS — L97319 Non-pressure chronic ulcer of right ankle with unspecified severity: Secondary | ICD-10-CM | POA: Diagnosis not present

## 2022-11-20 DIAGNOSIS — I70221 Atherosclerosis of native arteries of extremities with rest pain, right leg: Secondary | ICD-10-CM

## 2022-11-20 DIAGNOSIS — I70233 Atherosclerosis of native arteries of right leg with ulceration of ankle: Secondary | ICD-10-CM | POA: Diagnosis not present

## 2022-11-20 HISTORY — PX: ABDOMINAL AORTOGRAM W/LOWER EXTREMITY: CATH118223

## 2022-11-20 LAB — POCT I-STAT, CHEM 8
BUN: 25 mg/dL — ABNORMAL HIGH (ref 8–23)
Calcium, Ion: 1.29 mmol/L (ref 1.15–1.40)
Chloride: 107 mmol/L (ref 98–111)
Creatinine, Ser: 1.3 mg/dL — ABNORMAL HIGH (ref 0.61–1.24)
Glucose, Bld: 120 mg/dL — ABNORMAL HIGH (ref 70–99)
HCT: 33 % — ABNORMAL LOW (ref 39.0–52.0)
Hemoglobin: 11.2 g/dL — ABNORMAL LOW (ref 13.0–17.0)
Potassium: 4.1 mmol/L (ref 3.5–5.1)
Sodium: 143 mmol/L (ref 135–145)
TCO2: 25 mmol/L (ref 22–32)

## 2022-11-20 SURGERY — ABDOMINAL AORTOGRAM W/LOWER EXTREMITY
Anesthesia: LOCAL

## 2022-11-20 MED ORDER — LABETALOL HCL 5 MG/ML IV SOLN: 10.0000 mg | INTRAVENOUS | Status: DC | PRN

## 2022-11-20 MED ORDER — HEPARIN (PORCINE) IN NACL 1000-0.9 UT/500ML-% IV SOLN
INTRAVENOUS | Status: DC | PRN
  Administered 2022-11-20 (×2): 500 mL

## 2022-11-20 MED ORDER — HEPARIN (PORCINE) IN NACL 1000-0.9 UT/500ML-% IV SOLN
INTRAVENOUS | Status: AC
  Filled 2022-11-20: qty 1000

## 2022-11-20 MED ORDER — ONDANSETRON HCL 4 MG/2ML IJ SOLN: 4.0000 mg | Freq: Four times a day (QID) | INTRAMUSCULAR | Status: DC | PRN

## 2022-11-20 MED ORDER — SODIUM CHLORIDE 0.9% FLUSH: 3.0000 mL | Freq: Two times a day (BID) | INTRAVENOUS | Status: DC

## 2022-11-20 MED ORDER — SODIUM CHLORIDE 0.9 % WEIGHT BASED INFUSION: 1.0000 mL/kg/h | INTRAVENOUS | Status: DC

## 2022-11-20 MED ORDER — FENTANYL CITRATE (PF) 100 MCG/2ML IJ SOLN
INTRAMUSCULAR | Status: AC
  Filled 2022-11-20: qty 2

## 2022-11-20 MED ORDER — FENTANYL CITRATE (PF) 100 MCG/2ML IJ SOLN
INTRAMUSCULAR | Status: DC | PRN
  Administered 2022-11-20: 50 ug via INTRAVENOUS

## 2022-11-20 MED ORDER — MIDAZOLAM HCL 2 MG/2ML IJ SOLN
INTRAMUSCULAR | Status: AC
  Filled 2022-11-20: qty 2

## 2022-11-20 MED ORDER — ACETAMINOPHEN 325 MG PO TABS: 650.0000 mg | ORAL_TABLET | ORAL | Status: DC | PRN

## 2022-11-20 MED ORDER — HYDRALAZINE HCL 20 MG/ML IJ SOLN: 5.0000 mg | INTRAMUSCULAR | Status: DC | PRN

## 2022-11-20 MED ORDER — SODIUM CHLORIDE 0.9 % IV SOLN: 250.0000 mL | INTRAVENOUS | Status: DC | PRN

## 2022-11-20 MED ORDER — MIDAZOLAM HCL 2 MG/2ML IJ SOLN
INTRAMUSCULAR | Status: DC | PRN
  Administered 2022-11-20: 2 mg via INTRAVENOUS

## 2022-11-20 MED ORDER — IODIXANOL 320 MG/ML IV SOLN
INTRAVENOUS | Status: DC | PRN
  Administered 2022-11-20: 139 mL via INTRA_ARTERIAL

## 2022-11-20 MED ORDER — SODIUM CHLORIDE 0.9% FLUSH: 3.0000 mL | INTRAVENOUS | Status: DC | PRN

## 2022-11-20 MED ORDER — LIDOCAINE HCL (PF) 1 % IJ SOLN
INTRAMUSCULAR | Status: DC | PRN
  Administered 2022-11-20: 10 mL via SUBCUTANEOUS

## 2022-11-20 MED ORDER — SODIUM CHLORIDE 0.9 % IV SOLN: INTRAVENOUS | Status: DC

## 2022-11-20 MED ORDER — OXYCODONE HCL 5 MG PO TABS: 5.0000 mg | ORAL_TABLET | ORAL | Status: DC | PRN

## 2022-11-20 MED ORDER — LIDOCAINE HCL (PF) 1 % IJ SOLN
INTRAMUSCULAR | Status: AC
  Filled 2022-11-20: qty 30

## 2022-11-20 MED ORDER — MORPHINE SULFATE (PF) 2 MG/ML IV SOLN
2.0000 mg | INTRAVENOUS | Status: DC | PRN
  Administered 2022-11-20: 2 mg via INTRAVENOUS
  Filled 2022-11-20: qty 1

## 2022-11-20 SURGICAL SUPPLY — 10 items
CATH OMNI FLUSH 5F 65CM (CATHETERS) IMPLANT
CLOSURE MYNX CONTROL 5F (Vascular Products) IMPLANT
KIT MICROPUNCTURE NIT STIFF (SHEATH) IMPLANT
KIT PV (KITS) ×1 IMPLANT
SHEATH PINNACLE 5F 10CM (SHEATH) IMPLANT
SHEATH PROBE COVER 6X72 (BAG) IMPLANT
SYR MEDRAD MARK V 150ML (SYRINGE) IMPLANT
TRANSDUCER W/STOPCOCK (MISCELLANEOUS) ×1 IMPLANT
TRAY PV CATH (CUSTOM PROCEDURE TRAY) ×1 IMPLANT
WIRE BENTSON .035X145CM (WIRE) IMPLANT

## 2022-11-20 NOTE — Telephone Encounter (Signed)
No need for a refill.  Thanks, Dr. Amalia Hailey

## 2022-11-20 NOTE — Op Note (Signed)
    Patient name: [name] MRN: [id number] DOB: 1951/01/24 Sex: male  11/20/2022 Pre-operative Diagnosis: Right lower extremity chronic limb threatening ischemia Post-operative diagnosis:  Same Surgeon:  Erlene Quan C. Donzetta Matters, MD Procedure Performed: 1.  Ultrasound-guided cannulation left common femoral artery 2.  Aortogram with bilateral lower extremity runoff 3.  Selection of right common femoral artery and right lower extremity angiogram 4.  Mynx device closure left common femoral artery 5.  Moderate sedation with fentanyl and Versed for 34 minutes  Indications: 71 year old male with history of chronic right lower extremity limb threatening ischemia with leg ulceration and severely decreased ABI.  Plan is for aortogram with possible intervention.  Findings: The aorta is free of flow-limiting stenosis.  Right common iliac does have some haziness but nonflow limiting stenosis probably approximately 20%.  Left hypogastric artery is occluded right hypogastric artery is patent.  Right common femoral artery appears severely diseased with at least 50% stenosis.  Left lower extremity SFA, popliteal and three-vessel runoff with areas of disease but no flow limitation.  Right lower extremity SFA is severely diseased throughout its course occludes at the above-knee popliteal artery and then reconstitutes anterior tibial artery which is diminutive and posterior tibial artery in the mid calf.  Plan will be for vein mapping today and right common femoral to posterior tibial artery bypass in the near future.   Procedure:  The patient was identified in the holding area and taken to room 8.  The patient was then placed supine on the table and prepped and draped in the usual sterile fashion.  A time out was called.  Ultrasound was used to evaluate the left common femoral artery.  This was free of disease.  The area was anesthetized 1% lidocaine cannulated with micropuncture needle followed by wire sheath.   Images saved department record.  Concomitantly we administered fentanyl and Versed for moderate sedation and his vital signs were monitored by bedside nursing throughout the case.  We placed a Bentson wire followed by 5 French sheath Omni catheter to L1 spinal level and then performed aortogram followed by bilateral lower extremity runoff.  We then crossed the bifurcation with Omni catheter and Bentson wire perform right lower extremity angiography with dedicated ankle views of the groin and below the knee and the foot which demonstrates dominant runoff via the posterior tibial.  Plan will be for right common femoral to posterior tibial artery bypass in the near future.  Catheter was removed over wire and minx device was deployed without complication.  Contrast: 139cc  Taeshaun Rames C. Donzetta Matters, MD [facility] Office: [phone] Pager: [phone]

## 2022-11-20 NOTE — Telephone Encounter (Signed)
Patient has been updated.

## 2022-11-20 NOTE — Interval H&P Note (Signed)
History and Physical Interval Note:  11/20/2022 10:38 AM  [name]  has presented today for surgery, with the diagnosis of critical limb ischemia of right lower extremity.  The various methods of treatment have been discussed with the patient and family. After consideration of risks, benefits and other options for treatment, the patient has consented to  Procedure(s): ABDOMINAL AORTOGRAM W/LOWER EXTREMITY (N/A) as a surgical intervention.  The patient's history has been reviewed, patient examined, no change in status, stable for surgery.  I have reviewed the patient's chart and labs.  Questions were answered to the patient's satisfaction.     [name]

## 2022-11-21 ENCOUNTER — Encounter (HOSPITAL_COMMUNITY): Payer: Self-pay | Admitting: Vascular Surgery

## 2022-11-21 ENCOUNTER — Other Ambulatory Visit: Payer: Self-pay

## 2022-11-21 ENCOUNTER — Ambulatory Visit: Payer: Medicare Other | Admitting: Cardiovascular Disease

## 2022-11-21 DIAGNOSIS — I70221 Atherosclerosis of native arteries of extremities with rest pain, right leg: Secondary | ICD-10-CM

## 2022-12-04 ENCOUNTER — Encounter (HOSPITAL_COMMUNITY): Payer: Self-pay | Admitting: Vascular Surgery

## 2022-12-05 NOTE — Pre-Procedure Instructions (Signed)
Surgical Instructions    Your procedure is scheduled on Tuesday, December 19th.  Report to [facility] Main Entrance "A" at 05:30 A.M., then check in with the Admitting office.  Call this number if you have problems the morning of surgery:  [phone]   If you have any questions prior to your surgery date call [phone]: Open Monday-Friday 8am-4pm    Remember:  Do not eat or drink after midnight the night before your surgery     Take these medicines the morning of surgery with A SIP OF WATER  atorvastatin (LIPITOR)  esomeprazole (NEXIUM)  oxybutynin (DITROPAN-XL)   Follow your surgeon's instructions on when to stop Aspirin.  If no instructions were given by your surgeon then you will need to call the office to get those instructions.     As of today, STOP taking any Aleve, Naproxen, Ibuprofen, Motrin, Advil, Goody's, BC's, all herbal medications, fish oil, and all vitamins. This includes meloxicam (MOBIC).                     Do NOT Smoke (Tobacco/Vaping) for 24 hours prior to your procedure.  If you use a CPAP at night, you may bring your mask/headgear for your overnight stay.   Contacts, glasses, piercing's, hearing aid's, dentures or partials may not be worn into surgery, please bring cases for these belongings.    For patients admitted to the hospital, discharge time will be determined by your treatment team.   Patients discharged the day of surgery will not be allowed to drive home, and someone needs to stay with them for 24 hours.  SURGICAL WAITING ROOM VISITATION Patients having surgery or a procedure may have no more than 2 support people in the waiting area - these visitors may rotate.   Children under the age of 44 must have an adult with them who is not the patient. If the patient needs to stay at the hospital during part of their recovery, the visitor guidelines for inpatient rooms apply. Pre-op nurse will coordinate an appropriate time for 1 support person to  accompany patient in pre-op.  This support person may not rotate.   Please refer to the [facility] website for the visitor guidelines for Inpatients (after your surgery is over and you are in a regular room).    Special instructions:   [place]- Preparing For Surgery  Before surgery, you can play an important role. Because skin is not sterile, your skin needs to be as free of germs as possible. You can reduce the number of germs on your skin by washing with CHG (chlorahexidine gluconate) Soap before surgery.  CHG is an antiseptic cleaner which kills germs and bonds with the skin to continue killing germs even after washing.    Oral Hygiene is also important to reduce your risk of infection.  Remember - BRUSH YOUR TEETH THE MORNING OF SURGERY WITH YOUR REGULAR TOOTHPASTE  Please do not use if you have an allergy to CHG or antibacterial soaps. If your skin becomes reddened/irritated stop using the CHG.  Do not shave (including legs and underarms) for at least 48 hours prior to first CHG shower. It is OK to shave your face.  Please follow these instructions carefully.   Shower the NIGHT BEFORE SURGERY and the MORNING OF SURGERY  If you chose to wash your hair, wash your hair first as usual with your normal shampoo.  After you shampoo, rinse your hair and body thoroughly to remove the shampoo.  Use CHG Soap as you would any other liquid soap. You can apply CHG directly to the skin and wash gently with a scrungie or a clean washcloth.   Apply the CHG Soap to your body ONLY FROM THE NECK DOWN.  Do not use on open wounds or open sores. Avoid contact with your eyes, ears, mouth and genitals (private parts). Wash Face and genitals (private parts)  with your normal soap.   Wash thoroughly, paying special attention to the area where your surgery will be performed.  Thoroughly rinse your body with warm water from the neck down.  DO NOT shower/wash with your normal soap after using and rinsing  off the CHG Soap.  Pat yourself dry with a CLEAN TOWEL.  Wear CLEAN PAJAMAS to bed the night before surgery  Place CLEAN SHEETS on your bed the night before your surgery  DO NOT SLEEP WITH PETS.   Day of Surgery: Take a shower with CHG soap. Do not wear jewelry  Do not wear lotions, powders, colognes, or deodorant. Men may shave face and neck. Do not bring valuables to the hospital. [facility] is not responsible for any belongings or valuables.  Wear Clean/Comfortable clothing the morning of surgery Remember to brush your teeth WITH YOUR REGULAR TOOTHPASTE.   Please read over the following fact sheets that you were given.    If you received a COVID test during your pre-op visit  it is requested that you wear a mask when out in public, stay away from anyone that may not be feeling well and notify your surgeon if you develop symptoms. If you have been in contact with anyone that has tested positive in the last 10 days please notify you surgeon.

## 2022-12-06 ENCOUNTER — Other Ambulatory Visit: Payer: Self-pay

## 2022-12-06 ENCOUNTER — Encounter (HOSPITAL_COMMUNITY)
Admission: RE | Admit: 2022-12-06 | Discharge: 2022-12-06 | Disposition: A | Discharge status: Home / Self Care | Payer: Medicare Other | Source: Ambulatory Visit | Attending: Vascular Surgery | Admitting: Vascular Surgery

## 2022-12-06 ENCOUNTER — Encounter (HOSPITAL_COMMUNITY): Payer: Self-pay

## 2022-12-06 VITALS — BP 162/64 | HR 90 | Temp 97.4°F | Resp 19 | Ht 72.0 in | Wt 231.9 lb

## 2022-12-06 DIAGNOSIS — Z01818 Encounter for other preprocedural examination: Secondary | ICD-10-CM

## 2022-12-06 DIAGNOSIS — I70221 Atherosclerosis of native arteries of extremities with rest pain, right leg: Secondary | ICD-10-CM | POA: Insufficient documentation

## 2022-12-06 DIAGNOSIS — Z01812 Encounter for preprocedural laboratory examination: Secondary | ICD-10-CM | POA: Diagnosis not present

## 2022-12-06 HISTORY — DX: Umbilical hernia without obstruction or gangrene: K42.9

## 2022-12-06 LAB — COMPREHENSIVE METABOLIC PANEL
ALT: 20 U/L (ref 0–44)
AST: 19 U/L (ref 15–41)
Albumin: 3.8 g/dL (ref 3.5–5.0)
Alkaline Phosphatase: 74 U/L (ref 38–126)
Anion gap: 10 (ref 5–15)
BUN: 27 mg/dL — ABNORMAL HIGH (ref 8–23)
CO2: 25 mmol/L (ref 22–32)
Calcium: 10 mg/dL (ref 8.9–10.3)
Chloride: 103 mmol/L (ref 98–111)
Creatinine, Ser: 1.49 mg/dL — ABNORMAL HIGH (ref 0.61–1.24)
GFR, Estimated: 50 mL/min — ABNORMAL LOW (ref >60)
Glucose, Bld: 131 mg/dL — ABNORMAL HIGH (ref 70–99)
Potassium: 4.9 mmol/L (ref 3.5–5.1)
Sodium: 138 mmol/L (ref 135–145)
Total Bilirubin: 0.6 mg/dL (ref 0.3–1.2)
Total Protein: 6.9 g/dL (ref 6.5–8.1)

## 2022-12-06 LAB — URINALYSIS, ROUTINE W REFLEX MICROSCOPIC
Bilirubin Urine: NEGATIVE
Glucose, UA: NEGATIVE mg/dL
Hgb urine dipstick: NEGATIVE
Ketones, ur: NEGATIVE mg/dL
Leukocytes,Ua: NEGATIVE
Nitrite: NEGATIVE
Protein, ur: NEGATIVE mg/dL
Specific Gravity, Urine: 1.005 (ref 1.005–1.030)
pH: 7 (ref 5.0–8.0)

## 2022-12-06 LAB — CBC
HCT: 30.4 % — ABNORMAL LOW (ref 39.0–52.0)
Hemoglobin: 10.2 g/dL — ABNORMAL LOW (ref 13.0–17.0)
MCH: 32.1 pg (ref 26.0–34.0)
MCHC: 33.6 g/dL (ref 30.0–36.0)
MCV: 95.6 fL (ref 80.0–100.0)
Platelets: 361 10*3/uL (ref 150–400)
RBC: 3.18 MIL/uL — ABNORMAL LOW (ref 4.22–5.81)
RDW: 13.2 % (ref 11.5–15.5)
WBC: 10.5 10*3/uL (ref 4.0–10.5)
nRBC: 0 % (ref 0.0–0.2)

## 2022-12-06 LAB — PROTIME-INR
INR: 1 (ref 0.8–1.2)
Prothrombin Time: 13.5 seconds (ref 11.4–15.2)

## 2022-12-06 LAB — SURGICAL PCR SCREEN
MRSA, PCR: NEGATIVE
Staphylococcus aureus: NEGATIVE

## 2022-12-06 LAB — APTT: aPTT: 31 seconds (ref 24–36)

## 2022-12-06 NOTE — Progress Notes (Signed)
PCP - Dr. Iona Beard Cardiologist - denies  PPM/ICD - denies   Chest x-ray - 10/19/09 EKG - 11/20/22 Stress Test - denies ECHO - denies Cardiac Cath - denies  Sleep Study - denies   DM- denies   Blood Thinner Instructions: n/a Aspirin Instructions: take DOS  ERAS Protcol - no, NPO   COVID TEST- n/a   Anesthesia review: no  Patient denies shortness of breath, fever, cough and chest pain at PAT appointment   All instructions explained to the patient, with a verbal understanding of the material. Patient agrees to go over the instructions while at home for a better understanding. The opportunity to ask questions was provided.

## 2022-12-07 ENCOUNTER — Telehealth: Payer: Self-pay

## 2022-12-07 ENCOUNTER — Other Ambulatory Visit: Payer: Self-pay | Admitting: Podiatry

## 2022-12-07 NOTE — Telephone Encounter (Signed)
Pt called requesting a letter faxed to his employer for purposes of a "grant" he states that he is trying to get through work for his time off after surgery. I have faxed a letter to employer stating he is having surgery next tues and will be out of work for approx 6 weeks. Pt has no further questions/concerns at this time.

## 2022-12-12 ENCOUNTER — Encounter (HOSPITAL_COMMUNITY): Payer: Self-pay | Admitting: Vascular Surgery

## 2022-12-12 ENCOUNTER — Other Ambulatory Visit: Payer: Self-pay

## 2022-12-12 ENCOUNTER — Encounter (HOSPITAL_COMMUNITY)
Admission: RE | Disposition: A | Discharge status: Home / Self Care | Payer: Self-pay | Source: Home / Self Care | Attending: Vascular Surgery

## 2022-12-12 ENCOUNTER — Inpatient Hospital Stay (HOSPITAL_COMMUNITY): Payer: Medicare Other | Admitting: Anesthesiology

## 2022-12-12 ENCOUNTER — Inpatient Hospital Stay (HOSPITAL_COMMUNITY)
Admission: RE | Admit: 2022-12-12 | Discharge: 2022-12-15 | DRG: 253 | Disposition: A | Discharge status: Home / Self Care | Payer: Medicare Other | Attending: Vascular Surgery | Admitting: Vascular Surgery

## 2022-12-12 DIAGNOSIS — K219 Gastro-esophageal reflux disease without esophagitis: Secondary | ICD-10-CM | POA: Diagnosis present

## 2022-12-12 DIAGNOSIS — Z7982 Long term (current) use of aspirin: Secondary | ICD-10-CM | POA: Diagnosis not present

## 2022-12-12 DIAGNOSIS — Z8546 Personal history of malignant neoplasm of prostate: Secondary | ICD-10-CM

## 2022-12-12 DIAGNOSIS — I70238 Atherosclerosis of native arteries of right leg with ulceration of other part of lower right leg: Secondary | ICD-10-CM | POA: Diagnosis present

## 2022-12-12 DIAGNOSIS — Z800 Family history of malignant neoplasm of digestive organs: Secondary | ICD-10-CM | POA: Diagnosis not present

## 2022-12-12 DIAGNOSIS — L97819 Non-pressure chronic ulcer of other part of right lower leg with unspecified severity: Secondary | ICD-10-CM | POA: Diagnosis present

## 2022-12-12 DIAGNOSIS — Z8042 Family history of malignant neoplasm of prostate: Secondary | ICD-10-CM | POA: Diagnosis not present

## 2022-12-12 DIAGNOSIS — I70222 Atherosclerosis of native arteries of extremities with rest pain, left leg: Secondary | ICD-10-CM

## 2022-12-12 DIAGNOSIS — I70221 Atherosclerosis of native arteries of extremities with rest pain, right leg: Secondary | ICD-10-CM | POA: Diagnosis not present

## 2022-12-12 DIAGNOSIS — Z923 Personal history of irradiation: Secondary | ICD-10-CM

## 2022-12-12 DIAGNOSIS — F17210 Nicotine dependence, cigarettes, uncomplicated: Secondary | ICD-10-CM | POA: Diagnosis not present

## 2022-12-12 DIAGNOSIS — E785 Hyperlipidemia, unspecified: Secondary | ICD-10-CM | POA: Diagnosis present

## 2022-12-12 DIAGNOSIS — L97919 Non-pressure chronic ulcer of unspecified part of right lower leg with unspecified severity: Secondary | ICD-10-CM | POA: Diagnosis not present

## 2022-12-12 DIAGNOSIS — Z79899 Other long term (current) drug therapy: Secondary | ICD-10-CM | POA: Diagnosis not present

## 2022-12-12 DIAGNOSIS — Z87442 Personal history of urinary calculi: Secondary | ICD-10-CM

## 2022-12-12 DIAGNOSIS — I10 Essential (primary) hypertension: Secondary | ICD-10-CM

## 2022-12-12 DIAGNOSIS — Z87891 Personal history of nicotine dependence: Secondary | ICD-10-CM

## 2022-12-12 DIAGNOSIS — Z91013 Allergy to seafood: Secondary | ICD-10-CM

## 2022-12-12 HISTORY — PX: FEMORAL-TIBIAL BYPASS GRAFT: SHX938

## 2022-12-12 LAB — POCT I-STAT 7, (LYTES, BLD GAS, ICA,H+H)
Acid-base deficit: 4 mmol/L — ABNORMAL HIGH (ref 0.0–2.0)
Acid-base deficit: 3 mmol/L — ABNORMAL HIGH (ref 0.0–2.0)
Acid-base deficit: 3 mmol/L — ABNORMAL HIGH (ref 0.0–2.0)
Bicarbonate: 21.1 mmol/L (ref 20.0–28.0)
Bicarbonate: 23 mmol/L (ref 20.0–28.0)
Bicarbonate: 23.9 mmol/L (ref 20.0–28.0)
Calcium, Ion: 1.22 mmol/L (ref 1.15–1.40)
Calcium, Ion: 1.27 mmol/L (ref 1.15–1.40)
Calcium, Ion: 1.27 mmol/L (ref 1.15–1.40)
HCT: 24 % — ABNORMAL LOW (ref 39.0–52.0)
HCT: 26 % — ABNORMAL LOW (ref 39.0–52.0)
HCT: 38 % — ABNORMAL LOW (ref 39.0–52.0)
Hemoglobin: 8.2 g/dL — ABNORMAL LOW (ref 13.0–17.0)
Hemoglobin: 12.9 g/dL — ABNORMAL LOW (ref 13.0–17.0)
Hemoglobin: 8.8 g/dL — ABNORMAL LOW (ref 13.0–17.0)
O2 Saturation: 100 %
O2 Saturation: 99 %
O2 Saturation: 99 %
Patient temperature: 35.6
Patient temperature: 35.7
Patient temperature: 35.9
Potassium: 3.4 mmol/L — ABNORMAL LOW (ref 3.5–5.1)
Potassium: 4.2 mmol/L (ref 3.5–5.1)
Potassium: 4.3 mmol/L (ref 3.5–5.1)
Sodium: 142 mmol/L (ref 135–145)
Sodium: 140 mmol/L (ref 135–145)
Sodium: 141 mmol/L (ref 135–145)
TCO2: 22 mmol/L (ref 22–32)
TCO2: 24 mmol/L (ref 22–32)
TCO2: 25 mmol/L (ref 22–32)
pCO2 arterial: 36.9 mmHg (ref 32–48)
pCO2 arterial: 39.4 mmHg (ref 32–48)
pCO2 arterial: 46.2 mmHg (ref 32–48)
pH, Arterial: 7.359 (ref 7.35–7.45)
pH, Arterial: 7.316 — ABNORMAL LOW (ref 7.35–7.45)
pH, Arterial: 7.368 (ref 7.35–7.45)
pO2, Arterial: 321 mmHg — ABNORMAL HIGH (ref 83–108)
pO2, Arterial: 146 mmHg — ABNORMAL HIGH (ref 83–108)
pO2, Arterial: 176 mmHg — ABNORMAL HIGH (ref 83–108)

## 2022-12-12 LAB — POCT ACTIVATED CLOTTING TIME
Activated Clotting Time: 223 seconds
Activated Clotting Time: 250 seconds
Activated Clotting Time: 244 seconds

## 2022-12-12 LAB — PREPARE RBC (CROSSMATCH)

## 2022-12-12 SURGERY — CREATION, BYPASS, ARTERIAL, FEMORAL TO TIBIAL, USING GRAFT
Anesthesia: General | Laterality: Right

## 2022-12-12 MED ORDER — HYDROMORPHONE HCL 1 MG/ML IJ SOLN
INTRAMUSCULAR | Status: AC
  Filled 2022-12-12: qty 0.5

## 2022-12-12 MED ORDER — 0.9 % SODIUM CHLORIDE (POUR BTL) OPTIME
TOPICAL | Status: DC | PRN
  Administered 2022-12-12 (×2): 1000 mL

## 2022-12-12 MED ORDER — CEFAZOLIN SODIUM-DEXTROSE 2-4 GM/100ML-% IV SOLN
2.0000 g | Freq: Three times a day (TID) | INTRAVENOUS | Status: AC
  Administered 2022-12-12 (×2): 2 g via INTRAVENOUS
  Filled 2022-12-12 (×2): qty 100

## 2022-12-12 MED ORDER — LISINOPRIL-HYDROCHLOROTHIAZIDE 10-12.5 MG PO TABS: 1.0000 | ORAL_TABLET | ORAL | Status: DC

## 2022-12-12 MED ORDER — HYDROMORPHONE HCL 1 MG/ML IJ SOLN
0.5000 mg | INTRAMUSCULAR | Status: DC | PRN
  Administered 2022-12-12: 1 mg via INTRAVENOUS
  Filled 2022-12-12: qty 1

## 2022-12-12 MED ORDER — LACTATED RINGERS IV SOLN: INTRAVENOUS | Status: DC

## 2022-12-12 MED ORDER — ONDANSETRON HCL 4 MG/2ML IJ SOLN: 4.0000 mg | Freq: Four times a day (QID) | INTRAMUSCULAR | Status: DC | PRN

## 2022-12-12 MED ORDER — POTASSIUM CHLORIDE CRYS ER 20 MEQ PO TBCR: 20.0000 meq | EXTENDED_RELEASE_TABLET | Freq: Every day | ORAL | Status: DC | PRN

## 2022-12-12 MED ORDER — SUGAMMADEX SODIUM 200 MG/2ML IV SOLN
INTRAVENOUS | Status: DC | PRN
  Administered 2022-12-12: 225 mg via INTRAVENOUS

## 2022-12-12 MED ORDER — ASPIRIN 81 MG PO TBEC
81.0000 mg | DELAYED_RELEASE_TABLET | Freq: Every day | ORAL | Status: DC
  Administered 2022-12-12 – 2022-12-15 (×4): 81 mg via ORAL
  Filled 2022-12-12 (×4): qty 1

## 2022-12-12 MED ORDER — SODIUM CHLORIDE 0.9 % IV SOLN: INTRAVENOUS | Status: DC

## 2022-12-12 MED ORDER — OXYCODONE HCL 5 MG PO TABS: 5.0000 mg | ORAL_TABLET | Freq: Once | ORAL | Status: DC | PRN

## 2022-12-12 MED ORDER — HEPARIN SODIUM (PORCINE) 1000 UNIT/ML IJ SOLN
INTRAMUSCULAR | Status: DC | PRN
  Administered 2022-12-12: 10000 [IU] via INTRAVENOUS
  Administered 2022-12-12: 5000 [IU] via INTRAVENOUS

## 2022-12-12 MED ORDER — PAPAVERINE HCL 30 MG/ML IJ SOLN
INTRAMUSCULAR | Status: AC
  Filled 2022-12-12: qty 2

## 2022-12-12 MED ORDER — PHENYLEPHRINE HCL-NACL 20-0.9 MG/250ML-% IV SOLN
INTRAVENOUS | Status: DC | PRN
  Administered 2022-12-12: 35 ug/min via INTRAVENOUS

## 2022-12-12 MED ORDER — FENTANYL CITRATE (PF) 250 MCG/5ML IJ SOLN
INTRAMUSCULAR | Status: DC | PRN
  Administered 2022-12-12: 150 ug via INTRAVENOUS
  Administered 2022-12-12 (×2): 50 ug via INTRAVENOUS

## 2022-12-12 MED ORDER — ORAL CARE MOUTH RINSE: 15.0000 mL | Freq: Once | OROMUCOSAL | Status: AC

## 2022-12-12 MED ORDER — SENNOSIDES-DOCUSATE SODIUM 8.6-50 MG PO TABS: 1.0000 | ORAL_TABLET | Freq: Every evening | ORAL | Status: DC | PRN

## 2022-12-12 MED ORDER — DOCUSATE SODIUM 100 MG PO CAPS
100.0000 mg | ORAL_CAPSULE | Freq: Every day | ORAL | Status: DC
  Administered 2022-12-13 – 2022-12-14 (×2): 100 mg via ORAL
  Filled 2022-12-12 (×3): qty 1

## 2022-12-12 MED ORDER — DEXAMETHASONE SODIUM PHOSPHATE 10 MG/ML IJ SOLN
INTRAMUSCULAR | Status: DC | PRN
  Administered 2022-12-12: 10 mg via INTRAVENOUS

## 2022-12-12 MED ORDER — LISINOPRIL 10 MG PO TABS
10.0000 mg | ORAL_TABLET | ORAL | Status: DC
  Administered 2022-12-12 – 2022-12-14 (×2): 10 mg via ORAL
  Filled 2022-12-12 (×3): qty 1

## 2022-12-12 MED ORDER — CEFAZOLIN SODIUM-DEXTROSE 2-4 GM/100ML-% IV SOLN
2.0000 g | INTRAVENOUS | Status: AC
  Administered 2022-12-12: 2 g via INTRAVENOUS
  Filled 2022-12-12: qty 100

## 2022-12-12 MED ORDER — LABETALOL HCL 5 MG/ML IV SOLN: 10.0000 mg | INTRAVENOUS | Status: DC | PRN

## 2022-12-12 MED ORDER — CHLORHEXIDINE GLUCONATE 0.12 % MT SOLN
OROMUCOSAL | Status: AC
  Administered 2022-12-12: 15 mL via OROMUCOSAL
  Filled 2022-12-12: qty 15

## 2022-12-12 MED ORDER — HYDROCHLOROTHIAZIDE 12.5 MG PO TABS
12.5000 mg | ORAL_TABLET | ORAL | Status: DC
  Administered 2022-12-12 – 2022-12-14 (×2): 12.5 mg via ORAL
  Filled 2022-12-12 (×3): qty 1

## 2022-12-12 MED ORDER — SODIUM CHLORIDE 0.9 % IV SOLN: 500.0000 mL | Freq: Once | INTRAVENOUS | Status: DC | PRN

## 2022-12-12 MED ORDER — ATORVASTATIN CALCIUM 10 MG PO TABS
10.0000 mg | ORAL_TABLET | Freq: Every morning | ORAL | Status: DC
  Administered 2022-12-13: 10 mg via ORAL
  Filled 2022-12-12: qty 1

## 2022-12-12 MED ORDER — OXYCODONE HCL 5 MG/5ML PO SOLN: 5.0000 mg | Freq: Once | ORAL | Status: DC | PRN

## 2022-12-12 MED ORDER — MAGNESIUM SULFATE 2 GM/50ML IV SOLN: 2.0000 g | Freq: Every day | INTRAVENOUS | Status: DC | PRN

## 2022-12-12 MED ORDER — CHLORHEXIDINE GLUCONATE CLOTH 2 % EX PADS: 6.0000 | MEDICATED_PAD | Freq: Once | CUTANEOUS | Status: DC

## 2022-12-12 MED ORDER — ALBUMIN HUMAN 5 % IV SOLN: INTRAVENOUS | Status: DC | PRN

## 2022-12-12 MED ORDER — ONDANSETRON HCL 4 MG/2ML IJ SOLN
INTRAMUSCULAR | Status: DC | PRN
  Administered 2022-12-12: 4 mg via INTRAVENOUS

## 2022-12-12 MED ORDER — HEMOSTATIC AGENTS (NO CHARGE) OPTIME
TOPICAL | Status: DC | PRN
  Administered 2022-12-12: 1 via TOPICAL

## 2022-12-12 MED ORDER — SODIUM CHLORIDE 0.9% IV SOLUTION: Freq: Once | INTRAVENOUS | Status: DC

## 2022-12-12 MED ORDER — GUAIFENESIN-DM 100-10 MG/5ML PO SYRP: 15.0000 mL | ORAL_SOLUTION | ORAL | Status: DC | PRN

## 2022-12-12 MED ORDER — CHLORHEXIDINE GLUCONATE 0.12 % MT SOLN: 15.0000 mL | Freq: Once | OROMUCOSAL | Status: AC

## 2022-12-12 MED ORDER — HEPARIN SODIUM (PORCINE) 1000 UNIT/ML IJ SOLN
INTRAMUSCULAR | Status: AC
  Filled 2022-12-12: qty 10

## 2022-12-12 MED ORDER — FENTANYL CITRATE (PF) 250 MCG/5ML IJ SOLN
INTRAMUSCULAR | Status: AC
  Filled 2022-12-12: qty 5

## 2022-12-12 MED ORDER — ACETAMINOPHEN 650 MG RE SUPP: 325.0000 mg | RECTAL | Status: DC | PRN

## 2022-12-12 MED ORDER — PHENOL 1.4 % MT LIQD: 1.0000 | OROMUCOSAL | Status: DC | PRN

## 2022-12-12 MED ORDER — ROCURONIUM BROMIDE 10 MG/ML (PF) SYRINGE
PREFILLED_SYRINGE | INTRAVENOUS | Status: DC | PRN
  Administered 2022-12-12: 50 mg via INTRAVENOUS
  Administered 2022-12-12: 40 mg via INTRAVENOUS
  Administered 2022-12-12: 60 mg via INTRAVENOUS

## 2022-12-12 MED ORDER — FENTANYL CITRATE (PF) 100 MCG/2ML IJ SOLN: 25.0000 ug | INTRAMUSCULAR | Status: DC | PRN

## 2022-12-12 MED ORDER — ALUM & MAG HYDROXIDE-SIMETH 200-200-20 MG/5ML PO SUSP: 15.0000 mL | ORAL | Status: DC | PRN

## 2022-12-12 MED ORDER — LACTATED RINGERS IV SOLN: INTRAVENOUS | Status: DC | PRN

## 2022-12-12 MED ORDER — SODIUM CHLORIDE (PF) 0.9 % IJ SOLN
INTRAMUSCULAR | Status: AC
  Filled 2022-12-12: qty 10

## 2022-12-12 MED ORDER — HEPARIN 6000 UNIT IRRIGATION SOLUTION
Status: DC | PRN
  Administered 2022-12-12: 1

## 2022-12-12 MED ORDER — PROPOFOL 10 MG/ML IV BOLUS
INTRAVENOUS | Status: DC | PRN
  Administered 2022-12-12: 180 mg via INTRAVENOUS

## 2022-12-12 MED ORDER — HEPARIN 6000 UNIT IRRIGATION SOLUTION
Status: AC
  Filled 2022-12-12: qty 500

## 2022-12-12 MED ORDER — PANTOPRAZOLE SODIUM 40 MG PO TBEC
40.0000 mg | DELAYED_RELEASE_TABLET | Freq: Every day | ORAL | Status: DC
  Administered 2022-12-12 – 2022-12-15 (×4): 40 mg via ORAL
  Filled 2022-12-12 (×4): qty 1

## 2022-12-12 MED ORDER — OXYBUTYNIN CHLORIDE ER 10 MG PO TB24
10.0000 mg | ORAL_TABLET | Freq: Every day | ORAL | Status: DC
  Administered 2022-12-13 – 2022-12-15 (×3): 10 mg via ORAL
  Filled 2022-12-12 (×4): qty 1

## 2022-12-12 MED ORDER — PHENYLEPHRINE 80 MCG/ML (10ML) SYRINGE FOR IV PUSH (FOR BLOOD PRESSURE SUPPORT): PREFILLED_SYRINGE | INTRAVENOUS | Status: DC | PRN

## 2022-12-12 MED ORDER — PHENYLEPHRINE 80 MCG/ML (10ML) SYRINGE FOR IV PUSH (FOR BLOOD PRESSURE SUPPORT)
PREFILLED_SYRINGE | INTRAVENOUS | Status: DC | PRN
  Administered 2022-12-12: 160 ug via INTRAVENOUS

## 2022-12-12 MED ORDER — HYDROMORPHONE HCL 1 MG/ML IJ SOLN
INTRAMUSCULAR | Status: DC | PRN
  Administered 2022-12-12: .25 mg via INTRAVENOUS

## 2022-12-12 MED ORDER — OXYCODONE-ACETAMINOPHEN 5-325 MG PO TABS
1.0000 | ORAL_TABLET | ORAL | Status: DC | PRN
  Administered 2022-12-12: 1 via ORAL
  Administered 2022-12-13 – 2022-12-14 (×2): 2 via ORAL
  Filled 2022-12-12: qty 2
  Filled 2022-12-12: qty 1
  Filled 2022-12-12: qty 2
  Filled 2022-12-12: qty 1

## 2022-12-12 MED ORDER — HYDRALAZINE HCL 20 MG/ML IJ SOLN: 5.0000 mg | INTRAMUSCULAR | Status: DC | PRN

## 2022-12-12 MED ORDER — PROTAMINE SULFATE 10 MG/ML IV SOLN
INTRAVENOUS | Status: DC | PRN
  Administered 2022-12-12: 50 mg via INTRAVENOUS

## 2022-12-12 MED ORDER — HEPARIN SODIUM (PORCINE) 5000 UNIT/ML IJ SOLN
5000.0000 [IU] | Freq: Three times a day (TID) | INTRAMUSCULAR | Status: DC
  Administered 2022-12-13 – 2022-12-15 (×7): 5000 [IU] via SUBCUTANEOUS
  Filled 2022-12-12 (×7): qty 1

## 2022-12-12 MED ORDER — PROPOFOL 10 MG/ML IV BOLUS
INTRAVENOUS | Status: AC
  Filled 2022-12-12: qty 20

## 2022-12-12 MED ORDER — GABAPENTIN 100 MG PO CAPS: 100.0000 mg | ORAL_CAPSULE | Freq: Every evening | ORAL | Status: DC | PRN

## 2022-12-12 MED ORDER — LIDOCAINE 2% (20 MG/ML) 5 ML SYRINGE
INTRAMUSCULAR | Status: DC | PRN
  Administered 2022-12-12: 60 mg via INTRAVENOUS

## 2022-12-12 MED ORDER — METOPROLOL TARTRATE 5 MG/5ML IV SOLN: 2.0000 mg | INTRAVENOUS | Status: DC | PRN

## 2022-12-12 MED ORDER — BISACODYL 5 MG PO TBEC: 5.0000 mg | DELAYED_RELEASE_TABLET | Freq: Every day | ORAL | Status: DC | PRN

## 2022-12-12 MED ORDER — ROCURONIUM BROMIDE 10 MG/ML (PF) SYRINGE
PREFILLED_SYRINGE | INTRAVENOUS | Status: AC
  Filled 2022-12-12: qty 10

## 2022-12-12 MED ORDER — ACETAMINOPHEN 325 MG PO TABS: 325.0000 mg | ORAL_TABLET | ORAL | Status: DC | PRN

## 2022-12-12 SURGICAL SUPPLY — 62 items
ADH SKN CLS APL DERMABOND .7 (GAUZE/BANDAGES/DRESSINGS) ×2
ATTRACTOMAT 16X20 MAGNETIC DRP (DRAPES) IMPLANT
BAG COUNTER SPONGE SURGICOUNT (BAG) ×1 IMPLANT
BAG ISL DRAPE 18X18 STRL (DRAPES) ×1
BAG ISOLATION DRAPE 18X18 (DRAPES) IMPLANT
BAG SPNG CNTER NS LX DISP (BAG) ×1
BANDAGE ESMARK 6X9 LF (GAUZE/BANDAGES/DRESSINGS) IMPLANT
BNDG CMPR 9X6 STRL LF SNTH (GAUZE/BANDAGES/DRESSINGS)
BNDG ESMARK 6X9 LF (GAUZE/BANDAGES/DRESSINGS)
CANISTER SUCT 3000ML PPV (MISCELLANEOUS) ×1 IMPLANT
CANNULA VESSEL 3MM 2 BLNT TIP (CANNULA) IMPLANT
CATH EMB 3FR 40CM (CATHETERS) IMPLANT
CLIP LIGATING EXTRA MED SLVR (CLIP) ×1 IMPLANT
CLIP LIGATING EXTRA SM BLUE (MISCELLANEOUS) ×1 IMPLANT
COVER PROBE W GEL 5X96 (DRAPES) IMPLANT
CUFF TOURN SGL QUICK 24 (TOURNIQUET CUFF)
CUFF TOURN SGL QUICK 34 (TOURNIQUET CUFF)
CUFF TOURN SGL QUICK 42 (TOURNIQUET CUFF) IMPLANT
CUFF TRNQT CYL 24X4X16.5-23 (TOURNIQUET CUFF) IMPLANT
CUFF TRNQT CYL 34X4.125X (TOURNIQUET CUFF) IMPLANT
DERMABOND ADVANCED .7 DNX12 (GAUZE/BANDAGES/DRESSINGS) ×1 IMPLANT
DRAIN CHANNEL 15F RND FF W/TCR (WOUND CARE) IMPLANT
DRAPE C-ARM 42X72 X-RAY (DRAPES) IMPLANT
DRAPE HALF SHEET 40X57 (DRAPES) IMPLANT
DRAPE ISOLATION BAG 18X18 (DRAPES) ×1
DRSG COVADERM 4X14 (GAUZE/BANDAGES/DRESSINGS) IMPLANT
ELECT REM PT RETURN 9FT ADLT (ELECTROSURGICAL) ×1
ELECTRODE REM PT RTRN 9FT ADLT (ELECTROSURGICAL) ×1 IMPLANT
EVACUATOR SILICONE 100CC (DRAIN) IMPLANT
GLOVE BIO SURGEON STRL SZ7.5 (GLOVE) ×1 IMPLANT
GOWN STRL REUS W/ TWL LRG LVL3 (GOWN DISPOSABLE) ×2 IMPLANT
GOWN STRL REUS W/ TWL XL LVL3 (GOWN DISPOSABLE) ×1 IMPLANT
GOWN STRL REUS W/TWL LRG LVL3 (GOWN DISPOSABLE) ×2
GOWN STRL REUS W/TWL XL LVL3 (GOWN DISPOSABLE) ×1
GRAFT PROPATEN W/RING 6X80X60 (Vascular Products) IMPLANT
HEMOSTAT SNOW SURGICEL 2X4 (HEMOSTASIS) IMPLANT
INSERT FOGARTY SM (MISCELLANEOUS) IMPLANT
KIT BASIN OR (CUSTOM PROCEDURE TRAY) ×1 IMPLANT
KIT TURNOVER KIT B (KITS) ×1 IMPLANT
MARKER GRAFT CORONARY BYPASS (MISCELLANEOUS) IMPLANT
NS IRRIG 1000ML POUR BTL (IV SOLUTION) ×2 IMPLANT
PACK PERIPHERAL VASCULAR (CUSTOM PROCEDURE TRAY) ×1 IMPLANT
PAD ARMBOARD 7.5X6 YLW CONV (MISCELLANEOUS) ×2 IMPLANT
PENCIL BUTTON HOLSTER BLD 10FT (ELECTRODE) IMPLANT
POWDER SURGICEL 3.0 GRAM (HEMOSTASIS) IMPLANT
SET COLLECT BLD 21X3/4 12 (NEEDLE) IMPLANT
STAPLER VISISTAT 35W (STAPLE) IMPLANT
SUT ETHILON 3 0 PS 1 (SUTURE) IMPLANT
SUT MNCRL AB 4-0 PS2 18 (SUTURE) ×2 IMPLANT
SUT PROLENE 5 0 C 1 24 (SUTURE) ×1 IMPLANT
SUT PROLENE 6 0 BV (SUTURE) ×1 IMPLANT
SUT SILK 2 0 SH (SUTURE) ×1 IMPLANT
SUT SILK 3 0 (SUTURE) ×2
SUT SILK 3-0 18XBRD TIE 12 (SUTURE) IMPLANT
SUT VIC AB 2-0 CT1 27 (SUTURE) ×3
SUT VIC AB 2-0 CT1 TAPERPNT 27 (SUTURE) ×2 IMPLANT
SUT VIC AB 3-0 SH 27 (SUTURE) ×6
SUT VIC AB 3-0 SH 27X BRD (SUTURE) ×2 IMPLANT
TOWEL GREEN STERILE (TOWEL DISPOSABLE) ×1 IMPLANT
TRAY FOLEY MTR SLVR 16FR STAT (SET/KITS/TRAYS/PACK) ×1 IMPLANT
UNDERPAD 30X36 HEAVY ABSORB (UNDERPADS AND DIAPERS) ×1 IMPLANT
WATER STERILE IRR 1000ML POUR (IV SOLUTION) ×1 IMPLANT

## 2022-12-12 NOTE — TOC Initial Note (Signed)
Transition of Care [facility]) - Initial/Assessment Note    Patient Details  Name: [name] MRN: [id number] Date of Birth: March 24, 1951  Transition of Care [facility] CM/SW Contact:    Ninfa Meeker, RN Phone Number: 12/12/2022, 3:55 PM  Clinical Narrative:                 71 yr old male admitted with Chronic right lower extremity limb threatening ischemia with leg ulceration. S/p R.common femoral endarterectomy with angioplasty.  Transitional of care Screening Note: Transition of Care (TOC) Department has reviewed patient and no TOC needs have been identified at this time. We will continue to monitor patient advancement through Interdisciplinary progressions and if new patient needs arise, please place a consult.         Patient Goals and CMS Choice        Expected Discharge Plan and Services                                                Prior Living Arrangements/Services                       Activities of Daily Living      Permission Sought/Granted                  Emotional Assessment              Admission diagnosis:  Critical limb ischemia of right lower extremity ([place]) [I70.221] Patient Active Problem List   Diagnosis Date Noted   Critical limb ischemia of right lower extremity ([place]) 12/12/2022   Malignant neoplasm of prostate ([place]) 03/18/2019   PCP:  Iona Beard, MD Pharmacy:   [place], [place] - [id number] [place] [address] Phone: [phone] Fax: [phone]     Social Determinants of Health (SDOH) Interventions    Readmission Risk Interventions     No data to display

## 2022-12-12 NOTE — Op Note (Signed)
Patient name: [name] MRN: [id number] DOB: 04-21-1951 Sex: male  12/12/2022 Pre-operative Diagnosis: Chronic right lower extremity limb threatening ischemia with leg ulceration Post-operative diagnosis:  Same Surgeon:  Erlene Quan C. Donzetta Matters, MD Assistants: Curt Jews, MD; Paulo Fruit, [place] Procedure Performed: 1.  Harvest of right greater saphenous vein 2.  Right common femoral endarterectomy with saphenous vein patch angioplasty 3.  Right common femoral to posterior tibial artery bypass with composite 6 mm ringed PTFE and distal 6 cm of reversed greater saphenous vein   Indications: 71 year old male with history of chronic right lower extremity limb threatening ischemia with leg ulceration and severely decreased ABIs.  He has undergone aortogram which demonstrates severe common femoral disease as well as occlusive SFA disease and popliteal artery which is occluded throughout its length and reconstitutes the distal very healthy appearing posterior tibial artery and we have discussed right common femoral artery endarterectomy and right common femoral to posterior tibial artery bypass with vein versus PTFE.  Findings: Common femoral artery was heavily calcified posteriorly and extensive endarterectomy was performed and there was very strong inflow and good backbleeding from the profunda with minimal backbleeding from the SFA.  The entirety of the saphenous vein was harvested although below the knee this was severely diseased.  I initially sewed it in and end to side fashion to the common femoral artery in a reversed fashion but there was only minimal flow through the vein graft and I ultimately elected to replace this with PTFE sewing to the vein proximally as a patch angioplasty and then using the healthy segment of vein as a composite distally in a reversed fashion.  At completion Doppler there was very strong signal of the posterior tibial at the ankle that was graft  dependent.   Procedure:  The patient was identified in the holding area and taken to the operating room where he was placed supine operative when general anesthesia was induced.  He was sterilely prepped draped in the right lower extremity usual fashion, antibiotics were minister timeout was called.  We began using ultrasound to identify what appeared to be a suitable vein above the knee but below the knee was much more diminutive and there were many branches within the thigh and below the knee.  Proximally I made a transverse incision in the groin dissected down to the common femoral artery and dissected out of the inguinal ligament divided the crossing vein and placed a vessel loop around this and then dissected out the profunda dividing the crossing vein there as well as the SFA and placing Vesseloops around this as well.  Concomitantly Dr. Donnetta Hutching began below the knee exposing the greater saphenous vein and then ultimately exposing the posterior tibial artery where there was a large collateral coming in and the artery was very soft and there was strong Doppler signal there.  We made 3 separate skip incisions to identify the saphenous vein throughout and then tied this off distally and then at the saphenofemoral junction we placed a side-biting clamp and transected this and oversewed with 5-0 Prolene suture in a running mattress fashion.  We then tunneled from the posterior tibial artery up to the common femoral artery and the patient was fully heparinized.  I elected to reverse the vein so that I would not need to lyse the valves.  I then clamped the outflow of the common femoral followed by the inflow and then opened this longitudinally.  I performed extensive endarterectomy including up under the inguinal  ligament.  The vein was then spatulated in a reversed fashion and sewn into side with 5-0 Prolene suture.  Upon completion we attempted to flush through the graft with really there was minimal flow.  I then  passed a 3 Fogarty all the way up and attempted to increase the flow but the vein was torn.  With this I reclamped the outflow and inflow of the common femoral artery I then transected the bypass graft just after approximately 1 cm and then I spatulated this up and then I tunneled the PTFE 6 mm ringed graft and spatulated this and sewed this to the bypass graft is a functional patch angioplasty with 5-0 Prolene suture.  At this time upon completion we flushed the graft there was very strong flow below the knee.  The graft was reclamped and flushed with heparinized saline.  Distally I then transected the PTFE graft in the mid calf and then sewed a healthy appearing piece of vein and into the graft after spatulated both ends with 6-0 Prolene suture.  The vein was then reversed fashion.  I then flushed through the vein graft and clamped the PTFE graft again.  The leg was straightened the vein graft was trimmed to size.  I then clamped the posterior tibial artery distally and proximally opened this longitudinally.  There was very strong backbleeding at this level.  The vein was spatulated sewn into side with 6-0 Prolene suture.  Upon completion there was very strong flow through the graft and into the distal posterior tibial artery where the pulse was palpable.  There was a strong posterior tibial signal at the ankle that was graft dependent.  We administered 50 mg of protamine.  We closed all incisions above the knee with Vicryl and Monocryl and Dermabond is placed at the skin level.  Below the knee I closed the fascia with 2-0 Vicryl and then stapled the skin given the level of edema there.  Sterile dressing was applied.  Patient was awakened from anesthesia having tolerated procedure without any complication.  All counts were correct at completion.  EBL: 500 cc   Patton Rabinovich C. Donzetta Matters, MD [facility] Office: [phone] Pager: [phone]

## 2022-12-12 NOTE — Interval H&P Note (Signed)
History and Physical Interval Note:  12/12/2022 9:41 AM  [name]  has presented today for surgery, with the diagnosis of Critical limb ischemia of right lower extremity.  The various methods of treatment have been discussed with the patient and family. After consideration of risks, benefits and other options for treatment, the patient has consented to  Procedure(s): RIGHT COMMON FEMORAL-POSTERIOR TIBIAL ARTERY BYPASS (Right) as a surgical intervention.  The patient's history has been reviewed, patient examined, no change in status, stable for surgery.  I have reviewed the patient's chart and labs.  Questions were answered to the patient's satisfaction.     Curt Jews

## 2022-12-12 NOTE — Transfer of Care (Signed)
Immediate Anesthesia Transfer of Care Note  Patient: [name]  Procedure(s) Performed: RIGHT COMMON FEMORAL-POSTERIOR TIBIAL ARTERY BYPASS WITH VEIN HARVESTING OF THE GREATER SAPHENOUS VEIN AND FEMORAL ENDARTERECTOMY WITH COMPOSITE GRAFT (Right)  Patient Location: PACU  Anesthesia Type:General  Level of Consciousness: awake, alert , and patient cooperative  Airway & Oxygen Therapy: Patient Spontanous Breathing  Post-op Assessment: Report given to RN, Post -op Vital signs reviewed and stable, and Patient moving all extremities X 4  Post vital signs: Reviewed and stable  Last Vitals:  Vitals Value Taken Time  BP 163/75 12/12/22 1352  Temp    Pulse 81 12/12/22 1356  Resp 19 12/12/22 1356  SpO2 95 % 12/12/22 1356  Vitals shown include unvalidated device data.  Last Pain:  Vitals:   12/12/22 0832  TempSrc:   PainSc: 8       Patients Stated Pain Goal: 2 (93/26/71 2458)  Complications: No notable events documented.

## 2022-12-12 NOTE — Anesthesia Procedure Notes (Addendum)
Procedure Name: Intubation Date/Time: 12/12/2022 9:57 AM  Performed by: Rande Brunt, CRNAPre-anesthesia Checklist: Patient identified, Emergency Drugs available, Suction available and Patient being monitored Patient Re-evaluated:Patient Re-evaluated prior to induction Oxygen Delivery Method: Circle System Utilized Preoxygenation: Pre-oxygenation with 100% oxygen Induction Type: IV induction Ventilation: Mask ventilation without difficulty Laryngoscope Size: Mac and 4 Tube type: Oral Tube size: 7.5 mm Number of attempts: 1 Airway Equipment and Method: Stylet Placement Confirmation: ETT inserted through vocal cords under direct vision, positive ETCO2 and breath sounds checked- equal and bilateral Secured at: 21 cm Tube secured with: Tape Dental Injury: Teeth and Oropharynx as per pre-operative assessment

## 2022-12-12 NOTE — H&P (Signed)
HPI: [name] is a 71 y.o. male, who is here today for evaluation of ulceration and rest pain in his right lower extremity.  He has a history of heel spurs and initially this was treated with steroid injection.  More recently he had a walking boot and feels as though this wore a blistered area on the anterior portion of his right ankle above the ankle crease.  This has been progressive and he is having rest pain as well.  He reports that he is unable to sleep at night and has to sleep in a recliner with his right leg dependent.  He does report claudication type symptoms for a number of years.  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  He has had no prior history of tissue loss.  He does not have any history of cardiac disease.  He is a long-term cigarette smoker.       Past Medical History:  Diagnosis Date   GERD (gastroesophageal reflux disease)     History of kidney stones     Hyperlipidemia     Hypertension     Prostate cancer ([place]) 2011    radiation june 2020   Wears dentures      full   Wears glasses     Wears partial dentures      lower           Family History  Problem Relation Age of Onset   Gastric cancer Mother     Prostate cancer Father     Breast cancer Neg Hx     Pancreatic cancer Neg Hx        SOCIAL HISTORY: Social History         Socioeconomic History   Marital status: Single      Spouse name: Not on file   Number of children: 1   Years of education: Not on file   Highest education level: Not on file  Occupational History      Comment: customer service   Tobacco Use   Smoking status: Some Days      Packs/day: 0.25      Years: 21.00      Total pack years: 5.25      Types: Cigarettes   Smokeless tobacco: Never  Vaping Use   Vaping Use: Never used  Substance and Sexual Activity   Alcohol use: Never   Drug use: Never   Sexual activity: Not Currently  Other Topics Concern   Not on  file  Social History Narrative    Has one son.     Social Determinants of Health    Financial Resource Strain: Not on file  Food Insecurity: Not on file  Transportation Needs: Not on file  Physical Activity: Not on file  Stress: Not on file  Social Connections: Not on file  Intimate Partner Violence: Not on file          Allergies  Allergen Reactions   Shellfish Allergy Swelling            Current Outpatient Medications  Medication Sig Dispense Refill   aspirin EC 81 MG tablet Take 81 mg by mouth daily.       atorvastatin (LIPITOR) 10 MG tablet Take 10 mg by mouth daily.       doxycycline (VIBRA-TABS) 100 MG tablet Take 1 tablet (100 mg total) by mouth 2 (two) times daily. 20 tablet  0   esomeprazole (NEXIUM) 20 MG capsule Take 20 mg by mouth daily.       gabapentin (NEURONTIN) 100 MG capsule Take 1 capsule (100 mg total) by mouth 3 (three) times daily. 90 capsule 3   lisinopril-hydrochlorothiazide (PRINZIDE,ZESTORETIC) 10-12.5 MG tablet Take 1 tablet by mouth every other day.        meloxicam (MOBIC) 15 MG tablet Take 1 tablet by mouth once daily 30 tablet 0   Multiple Vitamins-Minerals (CENTRUM SILVER PO) Take 1 tablet by mouth daily.        oxybutynin (DITROPAN-XL) 10 MG 24 hr tablet Take 10 mg by mouth daily.                 Current Facility-Administered Medications  Medication Dose Route Frequency Provider Last Rate Last Admin   betamethasone acetate-betamethasone sodium phosphate (CELESTONE) injection 3 mg  3 mg Intra-articular Once Evans, Brent M, DPM       betamethasone acetate-betamethasone sodium phosphate (CELESTONE) injection 3 mg  3 mg Intra-articular Once Edrick Kins, DPM          REVIEW OF SYSTEMS:  '[X]'$  denotes positive finding, '[ ]'$  denotes negative finding Cardiac   Comments:  Chest pain or chest pressure:      Shortness of breath upon exertion:      Short of breath when lying flat:      Irregular heart rhythm:             Vascular      Pain in  calf, thigh, or hip brought on by ambulation: x    Pain in feet at night that wakes you up from your sleep:  x    Blood clot in your veins:      Leg swelling:              Pulmonary      Oxygen at home:      Productive cough:       Wheezing:              Neurologic      Sudden weakness in arms or legs:       Sudden numbness in arms or legs:       Sudden onset of difficulty speaking or slurred speech:      Temporary loss of vision in one eye:       Problems with dizziness:              Gastrointestinal      Blood in stool:       Vomited blood:              Genitourinary      Burning when urinating:       Blood in urine:             Psychiatric      Major depression:              Hematologic      Bleeding problems:      Problems with blood clotting too easily:             Skin      Rashes or ulcers:             Constitutional      Fever or chills:          PHYSICAL EXAM: Vitals:   12/12/22 0815  BP: (!) 165/70  Pulse: 88  Resp: 18  Temp: 98.5 F (36.9 C)  SpO2: 99%      GENERAL: The patient is a well-nourished male, in no acute distress. The vital signs are documented above. CARDIOVASCULAR: 2+ radial and 2+ femoral pulses bilaterally.  Absent popliteal and distal pulses bilaterally PULMONARY: There is good air exchange  MUSCULOSKELETAL: There are no major deformities or cyanosis. NEUROLOGIC: No focal weakness or paresthesias are detected. SKIN: He has a dry full-thickness eschar above his anterior ankle crease on the right approximately 4 cm in diameter.  No surrounding erythema PSYCHIATRIC: The patient has a normal affect.   DATA:  Lower extremity noninvasive studies reveal ankle arm index of 0.35 on the right and 0.83 on the left.   A/P:  71yo male with right leg wound and long segment occlusion of R sfa/popliteal reconstitutes distal pt artery. Plan R fem-pt with vein or ptfe today.   Rakisha Pincock C. Donzetta Matters, MD [facility]  [place] Office: [phone] Pager: [phone]

## 2022-12-12 NOTE — Anesthesia Preprocedure Evaluation (Signed)
Anesthesia Evaluation  Patient identified by MRN, date of birth, ID band Patient awake    Reviewed: Allergy & Precautions, H&P , NPO status , Patient's Chart, lab work & pertinent test results  Airway Mallampati: II   Neck ROM: full    Dental   Pulmonary Patient abstained from smoking., former smoker   breath sounds clear to auscultation       Cardiovascular hypertension, + Peripheral Vascular Disease   Rhythm:regular Rate:Normal     Neuro/Psych    GI/Hepatic ,GERD  ,,  Endo/Other    Renal/GU stones   H/o prostate CA    Musculoskeletal   Abdominal   Peds  Hematology   Anesthesia Other Findings   Reproductive/Obstetrics                             Anesthesia Physical Anesthesia Plan  ASA: 3  Anesthesia Plan: General   Post-op Pain Management:    Induction: Intravenous  PONV Risk Score and Plan: 2 and Ondansetron, Dexamethasone and Treatment may vary due to age or medical condition  Airway Management Planned: Oral ETT  Additional Equipment:   Intra-op Plan:   Post-operative Plan: Extubation in OR  Informed Consent: I have reviewed the patients History and Physical, chart, labs and discussed the procedure including the risks, benefits and alternatives for the proposed anesthesia with the patient or authorized representative who has indicated his/her understanding and acceptance.     Dental advisory given  Plan Discussed with: CRNA, Anesthesiologist and Surgeon  Anesthesia Plan Comments:         Anesthesia Quick Evaluation

## 2022-12-12 NOTE — Discharge Instructions (Signed)
 [facility]  Discharge instructions  Lower Extremity Bypass Surgery  Please refer to the following instruction for your post-procedure care. Your surgeon or physician assistant will discuss any changes with you.  Activity  You are encouraged to walk as much as you can. You can slowly return to normal activities during the month after your surgery. Avoid strenuous activity and heavy lifting until your doctor tells you it's OK. Avoid activities such as vacuuming or swinging a golf club. Do not drive until your doctor give the OK and you are no longer taking prescription pain medications. It is also normal to have difficulty with sleep habits, eating and bowel movement after surgery. These will go away with time.  Bathing/Showering  Shower daily after you go home. Do not soak in a bathtub, hot tub, or swim until the incision heals completely.  Incision Care  Clean your incision with mild soap and water. Shower every day. Pat the area dry with a clean towel. You do not need a bandage unless otherwise instructed. Do not apply any ointments or creams to your incision. If you have open wounds you will be instructed how to care for them or a visiting nurse may be arranged for you. If you have staples or sutures along your incision they will be removed at your post-op appointment. You may have skin glue on your incision. Do not peel it off. It will come off on its own in about one week.  Wash the groin wound with soap and water daily and pat dry. (No tub bath-only shower)  Then put a dry gauze or washcloth in the groin to keep this area dry to help prevent wound infection.  Do this daily and as needed.  Do not use Vaseline or neosporin on your incisions.  Only use soap and water on your incisions and then protect and keep dry.  Diet  Resume your normal diet. There are no special food restrictions following this procedure. A low fat/ low cholesterol diet is  recommended for all patients with vascular disease. In order to heal from your surgery, it is CRITICAL to get adequate nutrition. Your body requires vitamins, minerals, and protein. Vegetables are the best source of vitamins and minerals. Vegetables also provide the perfect balance of protein. Processed food has little nutritional value, so try to avoid this.  Medications  Resume taking all your medications unless your doctor or physician assistant tells you not to. If your incision is causing pain, you may take over-the-counter pain relievers such as acetaminophen (Tylenol). If you were prescribed a stronger pain medication, please aware these medication can cause nausea and constipation. Prevent nausea by taking the medication with a snack or meal. Avoid constipation by drinking plenty of fluids and eating foods with high amount of fiber, such as fruits, vegetables, and grains. Take Colace 100 mg (an over-the-counter stool softener) twice a day as needed for constipation.  Do not take Tylenol if you are taking prescription pain medications.  Follow Up  Our office will schedule a follow up appointment 2-3 weeks following discharge.  Please call us immediately for any of the following conditions  Severe or worsening pain in your legs or feet while at rest or while walking Increase pain, redness, warmth, or drainage (pus) from your incision site(s) Fever of 101 degree or higher The swelling in your leg with the bypass suddenly worsens and becomes more painful than when you were in the hospital If you have   been instructed to feel your graft pulse then you should do so every day. If you can no longer feel this pulse, call the office immediately. Not all patients are given this instruction.  Leg swelling is common after leg bypass surgery.  The swelling should improve over a few months following surgery. To improve the swelling, you may elevate your legs above the level of your heart while you are  sitting or resting. Your surgeon or physician assistant may ask you to apply an ACE wrap or wear compression (TED) stockings to help to reduce swelling.  Reduce your risk of vascular disease  Stop smoking. If you would like help call [facility] at 1-800-QUIT-NOW (1-800-784-8669) or [place] at 336-586-4000.  Manage your cholesterol Maintain a desired weight Control your diabetes weight Control your diabetes Keep your blood pressure down  If you have any questions, please call the office at [phone]  

## 2022-12-13 ENCOUNTER — Encounter (HOSPITAL_COMMUNITY): Payer: Self-pay | Admitting: Vascular Surgery

## 2022-12-13 LAB — CBC
HCT: 23.4 % — ABNORMAL LOW (ref 39.0–52.0)
Hemoglobin: 7.9 g/dL — ABNORMAL LOW (ref 13.0–17.0)
MCH: 32.5 pg (ref 26.0–34.0)
MCHC: 33.8 g/dL (ref 30.0–36.0)
MCV: 96.3 fL (ref 80.0–100.0)
Platelets: 263 10*3/uL (ref 150–400)
RBC: 2.43 MIL/uL — ABNORMAL LOW (ref 4.22–5.81)
RDW: 13.3 % (ref 11.5–15.5)
WBC: 10.4 10*3/uL (ref 4.0–10.5)
nRBC: 0 % (ref 0.0–0.2)

## 2022-12-13 LAB — BASIC METABOLIC PANEL
Anion gap: 11 (ref 5–15)
BUN: 28 mg/dL — ABNORMAL HIGH (ref 8–23)
CO2: 23 mmol/L (ref 22–32)
Calcium: 8.8 mg/dL — ABNORMAL LOW (ref 8.9–10.3)
Chloride: 105 mmol/L (ref 98–111)
Creatinine, Ser: 1.36 mg/dL — ABNORMAL HIGH (ref 0.61–1.24)
GFR, Estimated: 55 mL/min — ABNORMAL LOW (ref >60)
Glucose, Bld: 138 mg/dL — ABNORMAL HIGH (ref 70–99)
Potassium: 4.2 mmol/L (ref 3.5–5.1)
Sodium: 139 mmol/L (ref 135–145)

## 2022-12-13 LAB — LIPID PANEL
Cholesterol: 93 mg/dL (ref 0–200)
HDL: 34 mg/dL — ABNORMAL LOW (ref >40)
LDL Cholesterol: 47 mg/dL (ref 0–99)
Total CHOL/HDL Ratio: 2.7 RATIO
Triglycerides: 58 mg/dL (ref <150)
VLDL: 12 mg/dL (ref 0–40)

## 2022-12-13 MED ORDER — ATORVASTATIN CALCIUM 40 MG PO TABS
40.0000 mg | ORAL_TABLET | Freq: Every morning | ORAL | Status: DC
  Administered 2022-12-14 – 2022-12-15 (×2): 40 mg via ORAL
  Filled 2022-12-13 (×2): qty 1

## 2022-12-13 NOTE — Progress Notes (Addendum)
[facility] [place]  Subjective  - comfortable no new complaints pain controlled   Objective 98/62 80 98.4 F (36.9 C) (Oral) 17 95%  Intake/Output Summary (Last 24 hours) at 12/13/2022 0810 Last data filed at 12/13/2022 0530 Gross per 24 hour  Intake 3006.57 ml  Output 1445 ml  Net 1561.57 ml    Right LE incisions healing well, groin soft without hematoma Brisk PT with DP doppler signals right LE, brisk DP left LE Edema in the right LE, motor intact Lungs non  labored breathing  Assessment/Planning: 71 year old male with history of chronic right lower extremity limb threatening ischemia with leg ulceration and severely decreased ABIs.   POD # 1 Fem to PT bypass with ringed PTFE  Motor and brisk doppler intact right LE General no acute distress Mobility PT/OT pending HGB 7.9 asymptomatic EBL 425 ml post op anemia will observe am labs verses symptoms when mobile. Cr slightly elevated will observe, good urine OP > 1000 cc last 24 hours    Roxy Horseman 12/13/2022 8:10 AM --  Laboratory Lab Results: Recent Labs    12/12/22 1326 12/13/22 0410  WBC  --  10.4  HGB 8.8* 7.9*  HCT 26.0* 23.4*  PLT  --  263   BMET Recent Labs    12/12/22 1326 12/13/22 0410  NA 140 139  K 4.3 4.2  CL  --  105  CO2  --  23  GLUCOSE  --  138*  BUN  --  28*  CREATININE  --  1.36*  CALCIUM  --  8.8*    COAG Lab Results  Component Value Date   INR 1.0 12/06/2022   No results found for: "PTT"   I have independently interviewed and examined patient and agree with PA assessment and plan above. Cr at baseline and will continue to monitor.  Skylor Hughson C. Donzetta Matters, MD [facility] [place] Office: [phone] Pager: [phone]

## 2022-12-13 NOTE — Evaluation (Addendum)
Physical Therapy Evaluation Patient Details Name: [name] MRN: [id number] DOB: 1951/06/18 Today's Date: 12/13/2022  History of Present Illness  Pt is a 71 y.o. M who presents 12/12/2022 with ulceration and rest pain in RLE now s/p right common femoral endarterectomy 12/12/2022. Significant PMH: prostate CA.  Clinical Impression  Pt admitted s/p procedure listed above. PTA, pt lives alone and is independent. Pt reports he has family and friend assist upon d/c. Overall presents with good pain control and is mobilizing well. Pt ambulating 120 ft with a walker, progressing to an intermittent step through pattern. Suspect steady progress. Will continue to follow acutely to address gait and stair training prior to d/c home.      Recommendations for follow up therapy are one component of a multi-disciplinary discharge planning process, led by the attending physician.  Recommendations may be updated based on patient status, additional functional criteria and insurance authorization.  Follow Up Recommendations No PT follow up      Assistance Recommended at Discharge PRN  Patient can return home with the following  Assistance with cooking/housework;Assist for transportation;Help with stairs or ramp for entrance    Equipment Recommendations Rolling walker (2 wheels)  Recommendations for Other Services       Functional Status Assessment Patient has had a recent decline in their functional status and demonstrates the ability to make significant improvements in function in a reasonable and predictable amount of time.     Precautions / Restrictions Precautions Precautions: Fall;Other (comment) Precaution Comments: urinary incontinence Restrictions Weight Bearing Restrictions: No      Mobility  Bed Mobility Overal bed mobility: Modified Independent             General bed mobility comments: Increased time    Transfers Overall transfer level: Needs assistance Equipment  used: Rolling walker (2 wheels) Transfers: Sit to/from Stand Sit to Stand: Min guard                Ambulation/Gait Ambulation/Gait assistance: Supervision Gait Distance (Feet): 120 Feet Assistive device: Rolling walker (2 wheels) Gait Pattern/deviations: Step-to pattern, Step-through pattern, Decreased weight shift to right, Wide base of support Gait velocity: decreased     General Gait Details: Min cues for sequencing/technique, progressing to intermittent step through pattern  Stairs            Wheelchair Mobility    Modified Rankin (Stroke Patients Only)       Balance Overall balance assessment: Needs assistance Sitting-balance support: Feet supported Sitting balance-Leahy Scale: Good     Standing balance support: Bilateral upper extremity supported Standing balance-Leahy Scale: Fair                               Pertinent Vitals/Pain Pain Assessment Pain Assessment: Faces Faces Pain Scale: Hurts a little bit Pain Location: R calf Pain Descriptors / Indicators: Tightness Pain Intervention(s): Monitored during session    Home Living Family/patient expects to be discharged to:: Private residence Living Arrangements: Alone Available Help at Discharge: Friend(s);Family (sister, friends) Type of Home: House Home Access: Stairs to enter Entrance Stairs-Rails: Psychiatric nurse of Steps: 2   Home Layout: One level Home Equipment: None      Prior Function Prior Level of Function : Independent/Modified Independent;Driving                     Hand Dominance        Extremity/Trunk Assessment  Upper Extremity Assessment Upper Extremity Assessment: Defer to OT evaluation    Lower Extremity Assessment Lower Extremity Assessment: RLE deficits/detail RLE Deficits / Details: Increased edema, at least 3/5 strength. Wound on anterior aspect of ankle    Cervical / Trunk Assessment Cervical / Trunk Assessment:  Normal  Communication   Communication: No difficulties  Cognition Arousal/Alertness: Awake/alert Behavior During Therapy: WFL for tasks assessed/performed Overall Cognitive Status: Within Functional Limits for tasks assessed                                          General Comments      Exercises General Exercises - Lower Extremity Ankle Circles/Pumps: Seated, Both, 10 reps   Assessment/Plan    PT Assessment Patient needs continued PT services  PT Problem List Decreased strength;Decreased range of motion;Decreased balance;Decreased mobility;Pain       PT Treatment Interventions Gait training;DME instruction;Stair training;Functional mobility training;Therapeutic activities;Therapeutic exercise;Balance training;Patient/family education    PT Goals (Current goals can be found in the Care Plan section)  Acute Rehab PT Goals Patient Stated Goal: "do everything I'm told to do." PT Goal Formulation: With patient Time For Goal Achievement: 12/27/22 Potential to Achieve Goals: Good    Frequency Min 3X/week     Co-evaluation               AM-PAC PT "6 Clicks" Mobility  Outcome Measure Help needed turning from your back to your side while in a flat bed without using bedrails?: None Help needed moving from lying on your back to sitting on the side of a flat bed without using bedrails?: None Help needed moving to and from a bed to a chair (including a wheelchair)?: A Little Help needed standing up from a chair using your arms (e.g., wheelchair or bedside chair)?: A Little Help needed to walk in hospital room?: A Little Help needed climbing 3-5 steps with a railing? : A Little 6 Click Score: 20    End of Session   Activity Tolerance: Patient tolerated treatment well Patient left: in chair;with call bell/phone within reach Nurse Communication: Mobility status PT Visit Diagnosis: Other abnormalities of gait and mobility (R26.89);Difficulty in walking, not  elsewhere classified (R26.2)    Time: 1610-9604 PT Time Calculation (min) (ACUTE ONLY): 34 min   Charges:   PT Evaluation $PT Eval Low Complexity: 1 Low PT Treatments $Gait Training: 8-22 mins        Wyona Almas, PT, DPT Acute Rehabilitation Services Office [phone]   Deno Etienne 12/13/2022, 8:44 AM

## 2022-12-13 NOTE — Anesthesia Postprocedure Evaluation (Signed)
Anesthesia Post Note  Patient: [name]  Procedure(s) Performed: RIGHT COMMON FEMORAL-POSTERIOR TIBIAL ARTERY BYPASS WITH VEIN HARVESTING OF THE GREATER SAPHENOUS VEIN AND FEMORAL ENDARTERECTOMY WITH COMPOSITE GRAFT (Right)     Patient location during evaluation: PACU Anesthesia Type: General Level of consciousness: awake and alert Pain management: pain level controlled Vital Signs Assessment: post-procedure vital signs reviewed and stable Respiratory status: spontaneous breathing, nonlabored ventilation, respiratory function stable and patient connected to nasal cannula oxygen Cardiovascular status: blood pressure returned to baseline and stable Postop Assessment: no apparent nausea or vomiting Anesthetic complications: no   No notable events documented.  Last Vitals:  Vitals:   12/13/22 0405 12/13/22 0739  BP: (!) 115/50 98/62  Pulse: 84 80  Resp: 14 17  Temp: 36.7 C 36.9 C  SpO2: 98% 95%    Last Pain:  Vitals:   12/13/22 0739  TempSrc: Oral  PainSc:                  [place]

## 2022-12-13 NOTE — Progress Notes (Signed)
PHARMACIST LIPID MONITORING   [name] is a 71 y.o. male admitted on 12/12/2022 critical limb ischemia.  Pharmacy has been consulted to optimize lipid-lowering therapy with the indication of secondary prevention for clinical ASCVD.  Recent Labs:  Lipid Panel (last 6 months):   Lab Results  Component Value Date   CHOL 93 12/13/2022   TRIG 58 12/13/2022   HDL 34 (L) 12/13/2022   CHOLHDL 2.7 12/13/2022   VLDL 12 12/13/2022   LDLCALC 47 12/13/2022    Hepatic function panel (last 6 months):   Lab Results  Component Value Date   AST 19 12/06/2022   ALT 20 12/06/2022   ALKPHOS 74 12/06/2022   BILITOT 0.6 12/06/2022    SCr (since admission):   Serum creatinine: 1.36 mg/dL (H) 12/13/22 0410 Estimated creatinine clearance: 61.5 mL/min (A)  Current therapy and lipid therapy tolerance Current lipid-lowering therapy: atorvastatin 10 mg Previous lipid-lowering therapies (if applicable): n/a Documented or reported allergies or intolerances to lipid-lowering therapies (if applicable): none  Assessment:   Although patient at LDL goal, therapy can be further optimized given current limb ischemia.   Plan:    1.Statin intensity (high intensity recommended for all patients regardless of the LDL):  Add or increase statin to high intensity. Increase to atorvastatin 40 mg PO daily.   2.Add ezetimibe (if any one of the following):   Not indicated at this time.  3.Refer to lipid clinic:   No  4.Follow-up with:  Primary care provider - Iona Beard, MD  5.Follow-up labs after discharge:  Changes in lipid therapy were made. Check a lipid panel in 8-12 weeks then annually.       Gena Fray, PharmD PGY1 Pharmacy Resident   12/13/2022 11:07 AM

## 2022-12-13 NOTE — Evaluation (Signed)
Occupational Therapy Evaluation Patient Details Name: [name] MRN: [id number] DOB: 11/06/1951 Today's Date: 12/13/2022   History of Present Illness Pt is a 71 y.o. M who presents 12/12/2022 with ulceration and rest pain in RLE now s/p right common femoral endarterectomy 12/12/2022. Significant PMH: prostate CA.   Clinical Impression   PTA patient independent and driving. Admitted for above and presents with problem list below. Pt lives alone and will have support from sisters/friends intermittently at dc.  He currently requires min guard for transfers and mobility using RW, able to complete ADLs with up to min assist.  Limited functional reach to feet due to incision and edema, will benefit from further AE training. Anticipate he will progress well.  Will follow acutely, but no further needs required after dc.       Recommendations for follow up therapy are one component of a multi-disciplinary discharge planning process, led by the attending physician.  Recommendations may be updated based on patient status, additional functional criteria and insurance authorization.   Follow Up Recommendations  No OT follow up     Assistance Recommended at Discharge Intermittent Supervision/Assistance  Patient can return home with the following A little help with walking and/or transfers;A little help with bathing/dressing/bathroom;Help with stairs or ramp for entrance;Assist for transportation;Assistance with cooking/housework    Functional Status Assessment  Patient has had a recent decline in their functional status and demonstrates the ability to make significant improvements in function in a reasonable and predictable amount of time.  Equipment Recommendations  BSC/3in1;Other (comment) (RW)    Recommendations for Other Services       Precautions / Restrictions Precautions Precautions: Fall;Other (comment) Precaution Comments: urinary incontinence Restrictions Weight Bearing  Restrictions: No      Mobility Bed Mobility               General bed mobility comments: OOB upon entry    Transfers Overall transfer level: Needs assistance Equipment used: Rolling walker (2 wheels) Transfers: Sit to/from Stand Sit to Stand: Min guard           General transfer comment: cueing for hand placement      Balance Overall balance assessment: Needs assistance Sitting-balance support: Feet supported Sitting balance-Leahy Scale: Good     Standing balance support: Bilateral upper extremity supported, No upper extremity supported, During functional activity Standing balance-Leahy Scale: Fair Standing balance comment: relies on RW dynamically, able to engage in ADLs statically with min guard to close supervision                           ADL either performed or assessed with clinical judgement   ADL Overall ADL's : Needs assistance/impaired     Grooming: Supervision/safety;Standing           Upper Body Dressing : Set up;Sitting   Lower Body Dressing: Minimal assistance;Sit to/from stand   Toilet Transfer: Min guard;Ambulation;Rolling walker (2 wheels);BSC/3in1   Toileting- Clothing Manipulation and Hygiene: Min guard;Sit to/from stand       Functional mobility during ADLs: Min guard;Rolling walker (2 wheels)       Vision Baseline Vision/History: 1 Wears glasses Ability to See in Adequate Light: 0 Adequate Patient Visual Report: No change from baseline Vision Assessment?: No apparent visual deficits     Perception     Praxis      Pertinent Vitals/Pain Pain Assessment Pain Assessment: Faces Faces Pain Scale: Hurts a little bit Pain Location: R calf  Pain Descriptors / Indicators: Tightness Pain Intervention(s): Monitored during session, Repositioned     Hand Dominance     Extremity/Trunk Assessment Upper Extremity Assessment Upper Extremity Assessment: Overall WFL for tasks assessed   Lower Extremity  Assessment Lower Extremity Assessment: Defer to PT evaluation RLE Deficits / Details: Increased edema, at least 3/5 strength. Wound on anterior aspect of ankle   Cervical / Trunk Assessment Cervical / Trunk Assessment: Normal   Communication Communication Communication: No difficulties   Cognition Arousal/Alertness: Awake/alert Behavior During Therapy: WFL for tasks assessed/performed Overall Cognitive Status: Within Functional Limits for tasks assessed                                       General Comments  HR up to 130 with ADLS/in room mobility    Exercises     Shoulder Instructions      Home Living Family/patient expects to be discharged to:: Private residence Living Arrangements: Alone Available Help at Discharge: Friend(s);Family (sister, friends) Type of Home: House Home Access: Stairs to enter Technical brewer of Steps: 2 Entrance Stairs-Rails: Right;Left Home Layout: One level     Bathroom Shower/Tub: Walk-in shower;Tub/shower unit   Bathroom Toilet: Handicapped height (in hallway, bedroom toilet is low)     Home Equipment: None          Prior Functioning/Environment Prior Level of Function : Independent/Modified Independent;Driving                        OT Problem List: Decreased strength;Decreased activity tolerance;Impaired balance (sitting and/or standing);Increased edema;Pain;Decreased knowledge of precautions;Decreased knowledge of use of DME or AE      OT Treatment/Interventions: Self-care/ADL training;Energy conservation;Therapeutic activities;Balance training;Patient/family education;DME and/or AE instruction    OT Goals(Current goals can be found in the care plan section) Acute Rehab OT Goals Patient Stated Goal: get home OT Goal Formulation: With patient Time For Goal Achievement: 12/27/22 Potential to Achieve Goals: Good  OT Frequency: Min 2X/week    Co-evaluation              AM-PAC OT "6  Clicks" Daily Activity     Outcome Measure Help from another person eating meals?: None Help from another person taking care of personal grooming?: A Little Help from another person toileting, which includes using toliet, bedpan, or urinal?: A Little Help from another person bathing (including washing, rinsing, drying)?: A Little Help from another person to put on and taking off regular upper body clothing?: A Little Help from another person to put on and taking off regular lower body clothing?: A Little 6 Click Score: 19   End of Session Equipment Utilized During Treatment: Rolling walker (2 wheels) Nurse Communication: Mobility status  Activity Tolerance: Patient tolerated treatment well Patient left: in chair;with call bell/phone within reach  OT Visit Diagnosis: Other abnormalities of gait and mobility (R26.89)                Time: 7829-5621 OT Time Calculation (min): 26 min Charges:  OT General Charges $OT Visit: 1 Visit OT Evaluation $OT Eval Moderate Complexity: 1 Mod OT Treatments $Self Care/Home Management : 8-22 mins  Jolaine Artist, OT Acute Rehabilitation Services Office [place] 12/13/2022, 10:39 AM

## 2022-12-14 LAB — BASIC METABOLIC PANEL
Anion gap: 9 (ref 5–15)
BUN: 33 mg/dL — ABNORMAL HIGH (ref 8–23)
CO2: 23 mmol/L (ref 22–32)
Calcium: 8.8 mg/dL — ABNORMAL LOW (ref 8.9–10.3)
Chloride: 106 mmol/L (ref 98–111)
Creatinine, Ser: 1.43 mg/dL — ABNORMAL HIGH (ref 0.61–1.24)
GFR, Estimated: 52 mL/min — ABNORMAL LOW (ref >60)
Glucose, Bld: 123 mg/dL — ABNORMAL HIGH (ref 70–99)
Potassium: 3.9 mmol/L (ref 3.5–5.1)
Sodium: 138 mmol/L (ref 135–145)

## 2022-12-14 LAB — CBC
HCT: 24.1 % — ABNORMAL LOW (ref 39.0–52.0)
Hemoglobin: 8 g/dL — ABNORMAL LOW (ref 13.0–17.0)
MCH: 32 pg (ref 26.0–34.0)
MCHC: 33.2 g/dL (ref 30.0–36.0)
MCV: 96.4 fL (ref 80.0–100.0)
Platelets: 240 10*3/uL (ref 150–400)
RBC: 2.5 MIL/uL — ABNORMAL LOW (ref 4.22–5.81)
RDW: 13.5 % (ref 11.5–15.5)
WBC: 11.6 10*3/uL — ABNORMAL HIGH (ref 4.0–10.5)
nRBC: 0 % (ref 0.0–0.2)

## 2022-12-14 MED ORDER — METOPROLOL TARTRATE 5 MG/5ML IV SOLN
5.0000 mg | Freq: Four times a day (QID) | INTRAVENOUS | Status: DC
  Administered 2022-12-14 – 2022-12-15 (×4): 5 mg via INTRAVENOUS
  Filled 2022-12-14 (×4): qty 5

## 2022-12-14 NOTE — Progress Notes (Signed)
Physical Therapy Treatment Patient Details Name: [name] MRN: [id number] DOB: August 23, 1951 Today's Date: 12/14/2022   History of Present Illness Pt is a 71 y.o. M who presents 12/12/2022 with ulceration and rest pain in RLE now s/p right common femoral endarterectomy 12/12/2022. Significant PMH: prostate CA.    PT Comments    Pt was seen for mobility first for steps then to walk in room.  Pt had a brief increase to 125 on HR, and noted his heart monitor did not alarm even at a brief time when PT could not see to get pt to BR.  Will focus on strengthening and balance during his stay, monitor vitals and progress gait distance as his HR can allow.  Nursing attending to HR with meds, will follow up one more visit and likely can dc PT.   Recommendations for follow up therapy are one component of a multi-disciplinary discharge planning process, led by the attending physician.  Recommendations may be updated based on patient status, additional functional criteria and insurance authorization.  Follow Up Recommendations  No PT follow up     Assistance Recommended at Discharge PRN  Patient can return home with the following Assistance with cooking/housework;Assist for transportation   Equipment Recommendations  Rolling walker (2 wheels)    Recommendations for Other Services       Precautions / Restrictions Precautions Precautions: Fall;Other (comment) Precaution Comments: urinary incontinence Restrictions Weight Bearing Restrictions: No     Mobility  Bed Mobility               General bed mobility comments: in chair when PT arrived    Transfers Overall transfer level: Needs assistance Equipment used: Rolling walker (2 wheels) Transfers: Sit to/from Stand Sit to Stand: Min guard           General transfer comment: for safety    Ambulation/Gait Ambulation/Gait assistance: Supervision Gait Distance (Feet): 40 Feet Assistive device: Rolling walker (2  wheels) Gait Pattern/deviations: Step-to pattern, Step-through pattern, Wide base of support, Decreased weight shift to right Gait velocity: reduced Gait velocity interpretation: <1.31 ft/sec, indicative of household ambulator Pre-gait activities: standing balance ck General Gait Details: slow pace and observed HR high of 125   Stairs Stairs: Yes Stairs assistance: Min guard Stair Management: No rails, Forwards, With walker Number of Stairs: 3 General stair comments: pt used walker on portable step, with good sequence for LLE to be strong leg   Wheelchair Mobility    Modified Rankin (Stroke Patients Only)       Balance Overall balance assessment: Needs assistance Sitting-balance support: Feet supported Sitting balance-Leahy Scale: Good     Standing balance support: Bilateral upper extremity supported, During functional activity Standing balance-Leahy Scale: Fair                              Cognition Arousal/Alertness: Awake/alert Behavior During Therapy: WFL for tasks assessed/performed Overall Cognitive Status: Within Functional Limits for tasks assessed                                          Exercises      General Comments General comments (skin integrity, edema, etc.): pt is up to walk after using step to practice entrance for home, with good return on demonstration.  No LOB or difficulty with the strength to perform the task  Pertinent Vitals/Pain Pain Assessment Pain Assessment: Faces Faces Pain Scale: Hurts a little bit Pain Location: R calf Pain Descriptors / Indicators: Tightness Pain Intervention(s): Limited activity within patient's tolerance, Premedicated before session, Monitored during session, Repositioned    Home Living                          Prior Function            PT Goals (current goals can now be found in the care plan section) Acute Rehab PT Goals Patient Stated Goal: "do everything  I'm told to do." Progress towards PT goals: Progressing toward goals    Frequency    Min 3X/week      PT Plan Current plan remains appropriate    Co-evaluation              AM-PAC PT "6 Clicks" Mobility   Outcome Measure  Help needed turning from your back to your side while in a flat bed without using bedrails?: None Help needed moving from lying on your back to sitting on the side of a flat bed without using bedrails?: None Help needed moving to and from a bed to a chair (including a wheelchair)?: None Help needed standing up from a chair using your arms (e.g., wheelchair or bedside chair)?: A Little Help needed to walk in hospital room?: A Little Help needed climbing 3-5 steps with a railing? : A Little 6 Click Score: 21    End of Session Equipment Utilized During Treatment: Gait belt Activity Tolerance: Patient tolerated treatment well Patient left: in chair;with call bell/phone within reach Nurse Communication: Mobility status PT Visit Diagnosis: Other abnormalities of gait and mobility (R26.89);Difficulty in walking, not elsewhere classified (R26.2)     Time: 5625-6389 PT Time Calculation (min) (ACUTE ONLY): 20 min  Charges:  $Gait Training: 8-22 mins Ramond Dial 12/14/2022, 1:09 PM  Mee Hives, PT PhD Acute Rehab Dept. Number: [place] [facility] [place]

## 2022-12-14 NOTE — Progress Notes (Addendum)
  Progress Note    12/14/2022 7:30 AM 2 Days Post-Op  Subjective:  feeling comfortable, denies pain    Vitals:   12/14/22 0004 12/14/22 0428  BP: (!) 117/55 112/63  Pulse: 99   Resp: 19 16  Temp: 99.5 F (37.5 C) 98.7 F (37.1 C)  SpO2: 98% 97%    Physical Exam: Cardiac:  NSR Lungs:  nonlabored Incisions:  R groin and lower extremity incisions intact and dry Extremities:  right PT/DP doppler signals. Stable tissue loss on RLE. Some edema in RLE   CBC    Component Value Date/Time   WBC 11.6 (H) 12/14/2022 0126   RBC 2.50 (L) 12/14/2022 0126   HGB 8.0 (L) 12/14/2022 0126   HCT 24.1 (L) 12/14/2022 0126   PLT 240 12/14/2022 0126   MCV 96.4 12/14/2022 0126   MCH 32.0 12/14/2022 0126   MCHC 33.2 12/14/2022 0126   RDW 13.5 12/14/2022 0126    BMET    Component Value Date/Time   NA 138 12/14/2022 0126   K 3.9 12/14/2022 0126   CL 106 12/14/2022 0126   CO2 23 12/14/2022 0126   GLUCOSE 123 (H) 12/14/2022 0126   BUN 33 (H) 12/14/2022 0126   CREATININE 1.43 (H) 12/14/2022 0126   CALCIUM 8.8 (L) 12/14/2022 0126   GFRNONAA 52 (L) 12/14/2022 0126   GFRAA (L) 03/18/2010 0457    60        The eGFR has been calculated using the MDRD equation. This calculation has not been validated in all clinical situations. eGFR's persistently <60 mL/min signify possible Chronic Kidney Disease.    INR    Component Value Date/Time   INR 1.0 12/06/2022 1040     Intake/Output Summary (Last 24 hours) at 12/14/2022 0730 Last data filed at 12/14/2022 0125 Gross per 24 hour  Intake 0 ml  Output --  Net 0 ml      Assessment/Plan:  71 y.o. male is 2 days post op,s/p: right femoral to PT bypass with ringed PTFE   -R groin and lower extremity incisions intact and dry. RLE well perfused with brisk PT/DP doppler  -RLE with some edema, continue to elevate  -Hgb stable at 8.0, patient is asymptomatic -Cr stable with good urine output -PT/OT recommended no needed follow up.  Will monitor for at least one more day in hospital for improved mobility. Will also monitor HR when ambulating   Vicente Serene, PA-C [facility] [phone] 12/14/2022 7:30 AM   I have independently interviewed and examined  patient and agree with PA assessment and plan above. I have initiated iv metoprolol and will likely need transition to po prior to dc. Wound is stable if not somewhat improved.  Aastha Dayley C. Donzetta Matters, MD [facility] [place] Office: [phone] Pager: [phone]

## 2022-12-14 NOTE — Progress Notes (Signed)
Mobility Specialist Progress Note:   12/14/22 1036  Mobility  Activity Ambulated with assistance in room;Ambulated with assistance to bathroom  Level of Assistance Standby assist, set-up cues, supervision of patient - no hands on  Assistive Device Front wheel walker  Distance Ambulated (ft) 40 ft  Activity Response Tolerated well  $Mobility charge 1 Mobility   Pt in chair willing to participate in mobility. Complaints of 4/10 RLE pain. Pt asking to use BR before going into hall. When pt stepped in BR his HR shot up to 148, assisted pt back to chair and HR came down to 126. Left in chair with call bell in reach and all needs met.   Pre- Mobility:  110 HR During Mobility:148 HR Post Mobility: 92 HR  [name] Health and safety inspector Please contact via Secure Chat or  Rehab Office at [phone]

## 2022-12-14 NOTE — Progress Notes (Signed)
HR elevates to 140-150s when pt gets up to the bathroom. HR gradually returns to baseline. Pt denies CP. Will continue to monitor

## 2022-12-14 NOTE — Plan of Care (Signed)
  Problem: Activity: Goal: Ability to return to baseline activity level will improve Outcome: Progressing   Problem: Health Behavior/Discharge Planning: Goal: Ability to safely manage health-related needs after discharge will improve Outcome: Progressing

## 2022-12-15 MED ORDER — OXYCODONE-ACETAMINOPHEN 5-325 MG PO TABS: 1.0000 | ORAL_TABLET | Freq: Four times a day (QID) | ORAL | 0 refills | Status: DC | PRN

## 2022-12-15 MED ORDER — ATORVASTATIN CALCIUM 40 MG PO TABS: 40.0000 mg | ORAL_TABLET | Freq: Every morning | ORAL | 3 refills | Status: DC

## 2022-12-15 MED ORDER — METOPROLOL TARTRATE 25 MG PO TABS: 12.5000 mg | ORAL_TABLET | Freq: Two times a day (BID) | ORAL | 0 refills | Status: DC

## 2022-12-15 NOTE — TOC Transition Note (Addendum)
Transition of Care [facility]) - CM/SW Discharge Note   Patient Details  Name: [name] MRN: [id number] Date of Birth: [birth year]  Transition of Care [facility] CM/SW Contact:  Levonne Lapping, RN Phone Number: 12/15/2022, 2:10 PM   Clinical Narrative:     Case Manager discussed transition to home plan and recommendations for Patient. No additional therapy has been recommended for patient post discharge. A rolling walker and a bedside commode were both recommended . Patient had no preference of DME provider. [employer] will deliver to room prior to discharge. Patient's Sister will transport patient home.   No other TOC needs identified.   Final next level of care: Home/Self Care Barriers to Discharge: No Barriers Identified   Patient Goals and CMS Choice CMS Medicare.gov Compare Post Acute Care list provided to:: Other (Comment Required) (N/A  no post acute care recommended) Choice offered to / list presented to : NA  Discharge Placement  Home                          Discharge Plan and Services Additional resources added to the After Visit Summary for   In-house Referral: Clinical Social Work Discharge Planning Services: CM Consult Post Acute Care Choice: Durable Medical Equipment          DME Arranged: Bedside commode, Walker rolling DME Agency: [employer] Date DME Agency Contacted: 12/15/22 Time DME Agency Contacted: 0100 Representative spoke with at DME Agency: Brenton Grills HH Arranged: NA [employer] Agency: NA        Social Determinants of Health ([place]) Interventions [place]: No Food Insecurity (12/12/2022)  Housing: Low Risk  (12/12/2022)  Transportation Needs: No Transportation Needs (12/12/2022)  Utilities: Not At Risk (12/12/2022)  Tobacco Use: Medium Risk (12/13/2022)     Readmission Risk Interventions     No data to display

## 2022-12-15 NOTE — Progress Notes (Addendum)
  Progress Note    12/15/2022 7:36 AM 3 Days Post-Op  Subjective:  no complaints.  Wants to go home   Vitals:   12/14/22 2359 12/15/22 0457  BP: 122/69 (!) 104/57  Pulse: 84 85  Resp: 13 12  Temp: 98.6 F (37 C) 98.6 F (37 C)  SpO2: 100% 95%   Physical Exam: Lungs:  non labored Incisions:  incisions of RLE c/d/i Extremities:  brisk PT and dp by doppler Neurologic: A&O  CBC    Component Value Date/Time   WBC 11.6 (H) 12/14/2022 0126   RBC 2.50 (L) 12/14/2022 0126   HGB 8.0 (L) 12/14/2022 0126   HCT 24.1 (L) 12/14/2022 0126   PLT 240 12/14/2022 0126   MCV 96.4 12/14/2022 0126   MCH 32.0 12/14/2022 0126   MCHC 33.2 12/14/2022 0126   RDW 13.5 12/14/2022 0126    BMET    Component Value Date/Time   NA 138 12/14/2022 0126   K 3.9 12/14/2022 0126   CL 106 12/14/2022 0126   CO2 23 12/14/2022 0126   GLUCOSE 123 (H) 12/14/2022 0126   BUN 33 (H) 12/14/2022 0126   CREATININE 1.43 (H) 12/14/2022 0126   CALCIUM 8.8 (L) 12/14/2022 0126   GFRNONAA 52 (L) 12/14/2022 0126   GFRAA (L) 03/18/2010 0457    60        The eGFR has been calculated using the MDRD equation. This calculation has not been validated in all clinical situations. eGFR's persistently <60 mL/min signify possible Chronic Kidney Disease.    INR    Component Value Date/Time   INR 1.0 12/06/2022 1040     Intake/Output Summary (Last 24 hours) at 12/15/2022 0736 Last data filed at 12/14/2022 2200 Gross per 24 hour  Intake 200 ml  Output 200 ml  Net 0 ml     Assessment/Plan:  71 y.o. male is s/p R fem-PT bypass 3 Days Post-Op   R foot well perfused with brisk DP and PT by doppler Incisions are healing well HR 80s at rest; will work with mobility again today to see if he will need a beta blocker before discharge home   Dagoberto Ligas, [place] [facility] [phone] 12/15/2022 7:36 AM  I have independently interviewed and examined patient and agree with PA  assessment and plan above.  Will convert to p.o. beta-blockade and plan for discharge home.  Sindi Beckworth C. Donzetta Matters, MD [facility] [place] Office: [phone] Pager: [phone]

## 2022-12-15 NOTE — Care Management Important Message (Signed)
Important Message  Patient Details  Name: JEMAR PAULSEN MRN: [id number] Date of Birth: 1951/02/12   Medicare Important Message Given:  Yes     [name] 12/15/2022, 8:46 AM

## 2022-12-15 NOTE — Plan of Care (Signed)
  Problem: Education: Goal: Understanding of CV disease, CV risk reduction, and recovery process will improve Outcome: Adequate for Discharge Goal: Individualized Educational Video(s) Outcome: Adequate for Discharge   Problem: Activity: Goal: Ability to return to baseline activity level will improve Outcome: Adequate for Discharge   Problem: Cardiovascular: Goal: Ability to achieve and maintain adequate cardiovascular perfusion will improve Outcome: Adequate for Discharge Goal: Vascular access site(s) Level 0-1 will be maintained Outcome: Adequate for Discharge   Problem: Health Behavior/Discharge Planning: Goal: Ability to safely manage health-related needs after discharge will improve Outcome: Adequate for Discharge   Problem: Education: Goal: Knowledge of prescribed regimen will improve Outcome: Adequate for Discharge   Problem: Activity: Goal: Ability to tolerate increased activity will improve Outcome: Adequate for Discharge   Problem: Bowel/Gastric: Goal: Gastrointestinal status for postoperative course will improve Outcome: Adequate for Discharge   Problem: Clinical Measurements: Goal: Postoperative complications will be avoided or minimized Outcome: Adequate for Discharge Goal: Signs and symptoms of graft occlusion will improve Outcome: Adequate for Discharge   Problem: Skin Integrity: Goal: Demonstration of wound healing without infection will improve Outcome: Adequate for Discharge   Problem: Education: Goal: Knowledge of General Education information will improve Description: Including pain rating scale, medication(s)/side effects and non-pharmacologic comfort measures Outcome: Adequate for Discharge   Problem: Pain Managment: Goal: General experience of comfort will improve Outcome: Adequate for Discharge   Problem: Safety: Goal: Ability to remain free from injury will improve Outcome: Adequate for Discharge   Problem: Acute Rehab PT Goals(only PT  should resolve) Goal: Patient Will Transfer Sit To/From Stand Outcome: Adequate for Discharge Goal: Pt Will Ambulate Outcome: Adequate for Discharge Goal: Pt Will Go Up/Down Stairs Outcome: Adequate for Discharge   Problem: Acute Rehab OT Goals (only OT should resolve) Goal: Pt. Will Perform Grooming Outcome: Adequate for Discharge Goal: Pt. Will Perform Lower Body Dressing Outcome: Adequate for Discharge Goal: Pt. Will Transfer To Toilet Outcome: Adequate for Discharge Goal: Pt. Will Perform Toileting-Clothing Manipulation Outcome: Adequate for Discharge Goal: Pt. Will Perform Tub/Shower Transfer Outcome: Adequate for Discharge

## 2022-12-15 NOTE — Progress Notes (Signed)
Mobility Specialist Progress Note:   12/15/22 0938  Mobility  Activity Ambulated with assistance in hallway  Level of Assistance Standby assist, set-up cues, supervision of patient - no hands on  Assistive Device Front wheel walker  Distance Ambulated (ft) 200 ft  Activity Response Tolerated well  $Mobility charge 1 Mobility   Pt received in bed willing to participate in mobility. Complaints of RLE pain. Left in chair with call bell in reach and all needs met.   Gareth Eagle Catt[place]ya Wien Mobility Specialist Please contact via [facility] [employer] or  Rehab Office at [phone]

## 2022-12-15 NOTE — Progress Notes (Signed)
Occupational Therapy Treatment Patient Details Name: [name] MRN: [id number] DOB: [birth year] Today's Date: 12/15/2022   History of present illness Pt is a 71 y.o. M who presents 12/12/2022 with ulceration and rest pain in RLE now s/p right common femoral endarterectomy 12/12/2022. Significant PMH: prostate CA.   OT comments  Pt. Seen for skilled OT treatment.  Very pleasant and motivated to participate.  Able to complete standing grooming tasks with cues for rw management.  Toileting task min guard a.  Provided education regarding energy conservation.  Pt. Receptive and agreeable to sit for tasks that require longer and initiating rest breaks as needed.  Eager for home when able.     Recommendations for follow up therapy are one component of a multi-disciplinary discharge planning process, led by the attending physician.  Recommendations may be updated based on patient status, additional functional criteria and insurance authorization.    Follow Up Recommendations  No OT follow up     Assistance Recommended at Discharge Intermittent Supervision/Assistance  Patient can return home with the following  A little help with walking and/or transfers;A little help with bathing/dressing/bathroom;Help with stairs or ramp for entrance;Assist for transportation;Assistance with cooking/housework   Equipment Recommendations  BSC/3in1;Other (comment)    Recommendations for Other Services      Precautions / Restrictions Precautions Precautions: Fall;Other (comment) Precaution Comments: urinary incontinence       Mobility Bed Mobility               General bed mobility comments: in recliner at beginning of session and end of session    Transfers Overall transfer level: Needs assistance Equipment used: Rolling walker (2 wheels) Transfers: Sit to/from Stand, Bed to chair/wheelchair/BSC Sit to Stand: Min guard     Step pivot transfers: Min guard           Balance                                            ADL either performed or assessed with clinical judgement   ADL Overall ADL's : Needs assistance/impaired     Grooming: Wash/dry hands;Standing;Minimal assistance               Lower Body Dressing: Maximal assistance;Sitting/lateral leans Lower Body Dressing Details (indicate cue type and reason): R LE very swollen and tight pt. nervous to pull sock over foot right now, prefers assistance Toilet Transfer: Min guard;Ambulation;Rolling walker (2 wheels);BSC/3in1   Toileting- Clothing Manipulation and Hygiene: Min guard;Sit to/from stand       Functional mobility during ADLs: Min guard;Rolling walker (2 wheels) General ADL Comments: HR elevated during standing toileting while pt. was changing and managing his incontinence pads.  reviewed energy conservation and how that task could/should be performed sitting along with other grooming tasks for energy conservation.  pt. very receptive and pleasant. eager for adherance to life changes to get better!    Extremity/Trunk Assessment              Vision       Perception     Praxis      Cognition Arousal/Alertness: Awake/alert Behavior During Therapy: WFL for tasks assessed/performed Overall Cognitive Status: Within Functional Limits for tasks assessed  Exercises      Shoulder Instructions       General Comments      Pertinent Vitals/ Pain       Pain Assessment Pain Assessment: 0-10 Pain Score: 8  Pain Location: R calf Pain Descriptors / Indicators: Tightness Pain Intervention(s): Repositioned, Monitored during session  Home Living                                          Prior Functioning/Environment              Frequency  Min 2X/week        Progress Toward Goals  OT Goals(current goals can now be found in the care plan section)  Progress towards OT goals:  Progressing toward goals     Plan Discharge plan remains appropriate    Co-evaluation                 AM-PAC OT "6 Clicks" Daily Activity     Outcome Measure   Help from another person eating meals?: None Help from another person taking care of personal grooming?: A Little Help from another person toileting, which includes using toliet, bedpan, or urinal?: A Little Help from another person bathing (including washing, rinsing, drying)?: A Little Help from another person to put on and taking off regular upper body clothing?: A Little Help from another person to put on and taking off regular lower body clothing?: A Little 6 Click Score: 19    End of Session Equipment Utilized During Treatment: Rolling walker (2 wheels)  OT Visit Diagnosis: Other abnormalities of gait and mobility (R26.89)   Activity Tolerance Patient tolerated treatment well   Patient Left in chair;with call bell/phone within reach   Nurse Communication          Time: 1040-1100 OT Time Calculation (min): 20 min  Charges: OT General Charges $OT Visit: 1 Visit OT Treatments $Self Care/Home Management : 8-22 mins  Sonia Baller, COTA/L Acute Rehabilitation [phone]   Clearnce Sorrel Lorraine-COTA/L 12/15/2022, 12:02 PM

## 2022-12-15 NOTE — Progress Notes (Addendum)
Explained discharge instructions to patient. Reviewed follow up appointment and next medication administration times. Also reviewed education. Patient verbalized having an understanding for instructions given. All belongings are in the patient's possession to include home health equipment. IV and telemetry were removed. CCMD was notified. No other needs verbalized. Transported downstairs for discharge.

## 2022-12-19 LAB — TYPE AND SCREEN
ABO/RH(D): O POS
Antibody Screen: NEGATIVE
Unit division: 0
Unit division: 0

## 2022-12-19 LAB — BPAM RBC
Blood Product Expiration Date: 202312242359
Blood Product Expiration Date: 202401132359
ISSUE DATE / TIME: 202312212212
Unit Type and Rh: 5100
Unit Type and Rh: 5100

## 2022-12-20 NOTE — Discharge Summary (Signed)
Bypass Discharge Summary Patient ID: [name] [id number] 71 y.o. September 29, 1951  Admit date: 12/12/2022  Discharge date and time: 12/15/2022  2:13 PM   Admitting Physician: Waynetta Sandy, MD   Discharge Physician: same  Admission Diagnoses: Critical limb ischemia of right lower extremity [facility]) [I70.221]  Discharge Diagnoses: same  Admission Condition: fair  Discharged Condition: fair  Indication for Admission: post op care  Hospital Course: [name] is a 71 year old male with chronic right lower extremity limb threatening ischemia with leg ulceration.  He was brought in as an outpatient and underwent right common femoral endarterectomy with saphenous vein patch angioplasty as well as right common femoral to posterior tibial artery bypass with PTFE by Dr. Donzetta Matters on 12/12/2022.  He tolerated the procedure well and was admitted to the hospital postoperatively.  Most of hospital stay consisted of increasing mobility and pain control.  The right ankle wound remained dry without signs of infection during hospital stay.  Patient experienced significant sinus tachycardia with minimal exertion.  He required 12.5 mg of metoprolol twice a day at discharge.  He will follow-up with his PCP for management of beta-blockade going forward.  Hospital stay was otherwise unremarkable.  He maintained a brisk PT and DP signal by Doppler on the right lower extremity throughout his hospital stay.  He will follow-up in 2 to 3 weeks in [place] with Dr. Donnetta Hutching.  Discharge instructions were reviewed with patient and he voices understanding.  He was discharged home in stable condition.  Consults: None  Treatments: surgery: Right common femoral endarterectomy with saphenous vein patch and femoral to posterior tibial artery bypass with PTFE by Dr. Donzetta Matters on 12/12/2022    Disposition: Discharge disposition: 01-Home or Self Care       - For [facility] use ---  Post-op:  Wound  infection: No  Graft infection: No  Transfusion: No   New Arrhythmia: No Patency judged by: '[ ]'$  Dopper only, '[ ]'$  Palpable graft pulse, '[ ]'$  Palpable distal pulse, '[ ]'$  ABI inc. > 0.15, '[ ]'$  Duplex D/C Ambulatory Status: Ambulatory  Complications: MI: [ x] No, '[ ]'$  Troponin only, '[ ]'$  EKG or Clinical CHF: No Resp failure: [x ] none, '[ ]'$  Pneumonia, '[ ]'$  Ventilator Chg in renal function: '[ ]'$  none, '[ ]'$  Inc. Cr > 0.5, '[ ]'$  Temp. Dialysis, '[ ]'$  Permanent dialysis Stroke: '[ ]'$  None, '[ ]'$  Minor, '[ ]'$  Major Return to OR: No  Reason for return to OR: '[ ]'$  Bleeding, '[ ]'$  Infection, '[ ]'$  Thrombosis, '[ ]'$  Revision  Discharge medications: Statin use:  Yes ASA use:  Yes Plavix use:  No  for medical reason not indicated Beta blocker use: Yes Coumadin use: No  for medical reason not indicated    Patient Instructions:  Allergies as of 12/15/2022       Reactions   Shellfish Allergy Swelling        Medication List     TAKE these medications    aspirin EC 81 MG tablet Take 81 mg by mouth daily.   atorvastatin 40 MG tablet Commonly known as: LIPITOR Take 1 tablet (40 mg total) by mouth every morning. What changed:  medication strength how much to take   CENTRUM SILVER PO Take 1 tablet by mouth daily.   esomeprazole 20 MG capsule Commonly known as: NEXIUM Take 20 mg by mouth daily.   gabapentin 100 MG capsule Commonly known as: NEURONTIN Take 1 capsule (100 mg total) by mouth 3 (  three) times daily. What changed:  when to take this reasons to take this   lisinopril-hydrochlorothiazide 10-12.5 MG tablet Commonly known as: ZESTORETIC Take 1 tablet by mouth every other day.   meloxicam 15 MG tablet Commonly known as: MOBIC Take 1 tablet by mouth once daily   metoprolol tartrate 25 MG tablet Commonly known as: LOPRESSOR Take 0.5 tablets (12.5 mg total) by mouth 2 (two) times daily.   oxybutynin 10 MG 24 hr tablet Commonly known as: DITROPAN-XL Take 10 mg by mouth daily.    oxyCODONE-acetaminophen 5-325 MG tablet Commonly known as: PERCOCET/ROXICET Take 1 tablet by mouth every 6 (six) hours as needed for moderate pain.       Activity: activity as tolerated Diet: regular diet Wound Care: keep wound clean and dry  Follow-up with [facility] in 3 weeks.  SignedDagoberto Ligas 12/20/2022 7:32 AM

## 2022-12-27 ENCOUNTER — Ambulatory Visit (INDEPENDENT_AMBULATORY_CARE_PROVIDER_SITE_OTHER): Payer: Medicare Other | Admitting: Vascular Surgery

## 2022-12-27 ENCOUNTER — Encounter: Payer: Self-pay | Admitting: Vascular Surgery

## 2022-12-27 VITALS — BP 115/58 | HR 80 | Temp 98.2°F | Ht 72.0 in | Wt 239.2 lb

## 2022-12-27 DIAGNOSIS — I70221 Atherosclerosis of native arteries of extremities with rest pain, right leg: Secondary | ICD-10-CM

## 2022-12-27 NOTE — Progress Notes (Signed)
[facility] [place]  Patient name: [name] MRN: [id number] DOB: February 01, 1951 Sex: male  REASON FOR VISIT: Follow-up right common femoral to posterior tibial bypass with composite Gore-Tex and saphenous vein on 12/12/2022 Dr. Donzetta Matters  HPI: [name] is a 72 y.o. male here today for follow-up.  He had presented with critical limb ischemia with rest pain and a large area of tissue loss over the dorsum of his ankle on the right.  He underwent right femoral to posterior tibial bypass with composite vein.  His vein was small to large segment of it was unusable.  He did have a great deal of lower extremity swelling bilaterally preoperatively.  This has been somewhat progressive on the right postoperative.  Current Outpatient Medications  Medication Sig Dispense Refill   aspirin EC 81 MG tablet Take 81 mg by mouth daily.     atorvastatin (LIPITOR) 40 MG tablet Take 1 tablet (40 mg total) by mouth every morning. 30 tablet 3   esomeprazole (NEXIUM) 20 MG capsule Take 20 mg by mouth daily.     gabapentin (NEURONTIN) 100 MG capsule Take 1 capsule (100 mg total) by mouth 3 (three) times daily. (Patient taking differently: Take 100 mg by mouth at bedtime as needed (Nerve pain).) 90 capsule 3   lisinopril-hydrochlorothiazide (PRINZIDE,ZESTORETIC) 10-12.5 MG tablet Take 1 tablet by mouth every other day.      meloxicam (MOBIC) 15 MG tablet Take 1 tablet by mouth once daily 30 tablet 0   metoprolol tartrate (LOPRESSOR) 25 MG tablet Take 0.5 tablets (12.5 mg total) by mouth 2 (two) times daily. 30 tablet 0   Multiple Vitamins-Minerals (CENTRUM SILVER PO) Take 1 tablet by mouth daily.      oxybutynin (DITROPAN-XL) 10 MG 24 hr tablet Take 10 mg by mouth daily.     oxyCODONE-acetaminophen (PERCOCET/ROXICET) 5-325 MG tablet Take 1 tablet by mouth every 6 (six) hours as needed for moderate pain. (Patient not taking: Reported on 12/27/2022) 20  tablet 0   Current Facility-Administered Medications  Medication Dose Route Frequency Provider Last Rate Last Admin   betamethasone acetate-betamethasone sodium phosphate (CELESTONE) injection 3 mg  3 mg Intra-articular Once Daylene Katayama M, DPM       betamethasone acetate-betamethasone sodium phosphate (CELESTONE) injection 3 mg  3 mg Intra-articular Once Edrick Kins, DPM         PHYSICAL EXAM: Vitals:   12/27/22 0906  BP: (!) 115/58  Pulse: 80  Temp: 98.2 F (36.8 C)  SpO2: 96%  Weight: 239 lb 3.2 oz (108.5 kg)  Height: 6' (1.829 m)    GENERAL: The patient is a well-nourished male, in no acute distress. The vital signs are documented above. Incisions are all healing well.  He has subcuticular closure in his groin and thigh and these are all healing nicely.  He has staples in the area of the incision from his knee to above his ankle.  He does have some clear serous drainage from this. The area over his ankle appears to be healing with a large full-thickness eschar present  Until Doppler reveals excellent multiphasic signal at the posterior tibial level of the ankle   MEDICAL ISSUES: Good [name] recovery now 2 weeks out from [facility] posterior tibial bypass.  Will continue to mobilize.  We will see him back in 1 week for staple removal.  I am concerned regarding his swelling since he is only 2 weeks postop yesterday.   Rosetta Posner, MD FACS  [facility] [place] Office Tel [phone]  Note: Portions of this report may have been transcribed using voice recognition software.  Every effort has been made to ensure accuracy; however, inadvertent computerized transcription errors may still be present.

## 2023-01-01 ENCOUNTER — Ambulatory Visit: Payer: Medicare Other | Admitting: Podiatry

## 2023-01-01 VITALS — BP 134/74

## 2023-01-01 DIAGNOSIS — L97912 Non-pressure chronic ulcer of unspecified part of right lower leg with fat layer exposed: Secondary | ICD-10-CM | POA: Diagnosis not present

## 2023-01-01 NOTE — Progress Notes (Signed)
   Chief Complaint  Patient presents with   Foot Ulcer    Subjective: 72 y.o. male presenting for follow-up right lower extremity critical limb ischemia.  Patient underwent bypass surgery 12/12/2022.  He continues to have ulcer to the anterior aspect of the ankle.  Presenting for further treatment and evaluation   Past Medical History:  Diagnosis Date   GERD (gastroesophageal reflux disease)    History of kidney stones    Hyperlipidemia    Hypertension    Prostate cancer [facility]) 2011   radiation june 2020   Umbilical hernia    Wears dentures    full   Wears glasses    Wears partial dentures    lower   Past Surgical History:  Procedure Laterality Date   ABDOMINAL AORTOGRAM W/LOWER EXTREMITY N/A 11/20/2022   Procedure: ABDOMINAL AORTOGRAM W/LOWER EXTREMITY;  Surgeon: Waynetta Sandy, MD;  Location: [place] [facility];  Service: Cardiovascular;  Laterality: N/A;   COLONOSCOPY N/A 08/25/2020   Procedure: COLONOSCOPY;  Surgeon: Daneil Dolin, MD;  [facility];  Service: Endoscopy;  Laterality: N/A;  9:30   COLONOSCOPY     CYSTOSCOPY WITH LITHOLAPAXY N/A 05/18/2021   Procedure: CYSTOSCOPY WITH LITHOLAPAXY;  Surgeon: Ceasar Mons, MD;  Location: [facility];  Service: Urology;  Laterality: N/A;  ONLY NEEDS  30 MIN   FEMORAL-TIBIAL BYPASS GRAFT Right 12/12/2022   Procedure: RIGHT COMMON FEMORAL-POSTERIOR TIBIAL ARTERY BYPASS WITH VEIN HARVESTING OF THE GREATER SAPHENOUS VEIN AND FEMORAL ENDARTERECTOMY WITH COMPOSITE GRAFT;  Surgeon: Waynetta Sandy, MD;  Location: [place];  Service: Vascular;  Laterality: Right;   POLYPECTOMY  08/25/2020   Procedure: POLYPECTOMY;  Surgeon: Daneil Dolin, MD;  [facility];  Service: Endoscopy;;   ROBOT ASSISTED LAPAROSCOPIC RADICAL PROSTATECTOMY  2011   TONSILLECTOMY  age 78   and adenoids removed   Allergies  Allergen Reactions   Shellfish Allergy Swelling    RT ankle  12/14/2022     RT ankle 01/01/2023  Objective: Physical Exam General: The patient is alert and oriented x3 in no acute distress.  Dermatology: Ulcer anterior aspect right ankle approximately 5.5 x 5.5 cm.  Fibrotic wound base.  Unstageable.  No malodor.  Serous drainage.  Please see above noted photo  Vascular: Status post right common femoral to posterior tibial bypass with composite cortex and saphenous vein on 12/12/2022 Dr. Donzetta Matters  Neurological: Epicritic and protective threshold intact bilateral.   Musculoskeletal: No prior amputations.  No pedal deformity  Assessment: 1.  Ulcer right anterior ankle 2.  Status post right common femoral to posterior tibial bypass with composite Gore-Tex and saphenous vein 12/12/2022 Dr. Donzetta Matters -Patient evaluated.  Betadine wet-to-dry dressings changed -Patient has follow-up with vascular 01/03/2023 -Referral placed for wound care center.  Their input and specialty is always greatly appreciated -Return to clinic as needed  *Works at [employer] in [place], DPM [facility]  Dr. Edrick Kins, DPM    [address], [place] [address]                Office [phone]  Fax [phone]

## 2023-01-03 ENCOUNTER — Encounter: Payer: Self-pay | Admitting: Vascular Surgery

## 2023-01-03 ENCOUNTER — Ambulatory Visit (INDEPENDENT_AMBULATORY_CARE_PROVIDER_SITE_OTHER): Payer: Medicare Other | Admitting: Vascular Surgery

## 2023-01-03 VITALS — BP 151/66 | HR 95 | Temp 98.6°F | Ht 72.0 in | Wt 239.0 lb

## 2023-01-03 DIAGNOSIS — I70221 Atherosclerosis of native arteries of extremities with rest pain, right leg: Secondary | ICD-10-CM

## 2023-01-03 NOTE — Progress Notes (Signed)
   [facility] [place]  Patient name: [name] MRN: [id number] DOB: February 17, 1951 Sex: male  REASON FOR VISIT: Follow-up right positive femoral to posterior tibial bypass by Dr. Donzetta Matters.  HPI: [name] is a 72 y.o. male today for staple remover.  Was seen 1 week ago which was 2 weeks postop.  He had significant swelling.  His staples are removed today.  Current Outpatient Medications  Medication Sig Dispense Refill   aspirin EC 81 MG tablet Take 81 mg by mouth daily.     atorvastatin (LIPITOR) 40 MG tablet Take 1 tablet (40 mg total) by mouth every morning. 30 tablet 3   esomeprazole (NEXIUM) 20 MG capsule Take 20 mg by mouth daily.     gabapentin (NEURONTIN) 100 MG capsule Take 1 capsule (100 mg total) by mouth 3 (three) times daily. (Patient taking differently: Take 100 mg by mouth at bedtime as needed (Nerve pain).) 90 capsule 3   lisinopril-hydrochlorothiazide (PRINZIDE,ZESTORETIC) 10-12.5 MG tablet Take 1 tablet by mouth every other day.      meloxicam (MOBIC) 15 MG tablet Take 1 tablet by mouth once daily 30 tablet 0   metoprolol tartrate (LOPRESSOR) 25 MG tablet Take 0.5 tablets (12.5 mg total) by mouth 2 (two) times daily. 30 tablet 0   Multiple Vitamins-Minerals (CENTRUM SILVER PO) Take 1 tablet by mouth daily.      oxybutynin (DITROPAN-XL) 10 MG 24 hr tablet Take 10 mg by mouth daily.     SPIKEVAX syringe      oxyCODONE-acetaminophen (PERCOCET/ROXICET) 5-325 MG tablet Take 1 tablet by mouth every 6 (six) hours as needed for moderate pain. (Patient not taking: Reported on 01/03/2023) 20 tablet 0   Current Facility-Administered Medications  Medication Dose Route Frequency Provider Last Rate Last Admin   betamethasone acetate-betamethasone sodium phosphate (CELESTONE) injection 3 mg  3 mg Intra-articular Once Daylene Katayama M, DPM       betamethasone acetate-betamethasone sodium phosphate (CELESTONE) injection 3 mg   3 mg Intra-articular Once Edrick Kins, DPM         PHYSICAL EXAM: Vitals:   01/03/23 0914  BP: (!) 151/66  Pulse: 95  Temp: 98.6 F (37 C)  SpO2: 97%  Weight: 239 lb (108.4 kg)  Height: 6' (1.829 m)    GENERAL: The patient is a well-nourished male, in no acute distress. The vital signs are documented above. He does continue to have swelling and serous drainage.  Staples removed.  He does have full-thickness eschar over the dorsum of his foot.  He has seen Dr. Amalia Hailey who is referred him to [facility] wound care.  He does have biphasic posterior tibial signal with hand-held Doppler.  MEDICAL ISSUES: I again discussed patient will take quite some time for resolution of his full-thickness wound on the dorsum of his foot.  I will see him again in 1 month with noninvasive studies   Rosetta Posner, MD [facility] [phone]  Note: Portions of this report may have been transcribed using voice recognition software.  Every effort has been made to ensure accuracy; however, inadvertent computerized transcription errors may still be present.

## 2023-01-11 ENCOUNTER — Encounter (HOSPITAL_BASED_OUTPATIENT_CLINIC_OR_DEPARTMENT_OTHER): Payer: Medicare Other | Attending: Internal Medicine | Admitting: Internal Medicine

## 2023-01-11 DIAGNOSIS — Z8546 Personal history of malignant neoplasm of prostate: Secondary | ICD-10-CM | POA: Insufficient documentation

## 2023-01-11 DIAGNOSIS — S81801A Unspecified open wound, right lower leg, initial encounter: Secondary | ICD-10-CM | POA: Diagnosis not present

## 2023-01-11 DIAGNOSIS — S91301D Unspecified open wound, right foot, subsequent encounter: Secondary | ICD-10-CM | POA: Diagnosis not present

## 2023-01-11 DIAGNOSIS — Y838 Other surgical procedures as the cause of abnormal reaction of the patient, or of later complication, without mention of misadventure at the time of the procedure: Secondary | ICD-10-CM | POA: Insufficient documentation

## 2023-01-11 DIAGNOSIS — L97812 Non-pressure chronic ulcer of other part of right lower leg with fat layer exposed: Secondary | ICD-10-CM | POA: Insufficient documentation

## 2023-01-11 DIAGNOSIS — T8131XA Disruption of external operation (surgical) wound, not elsewhere classified, initial encounter: Secondary | ICD-10-CM

## 2023-01-11 DIAGNOSIS — I119 Hypertensive heart disease without heart failure: Secondary | ICD-10-CM | POA: Diagnosis not present

## 2023-01-11 DIAGNOSIS — I739 Peripheral vascular disease, unspecified: Secondary | ICD-10-CM | POA: Diagnosis not present

## 2023-01-11 DIAGNOSIS — I10 Essential (primary) hypertension: Secondary | ICD-10-CM | POA: Diagnosis not present

## 2023-01-11 DIAGNOSIS — I70238 Atherosclerosis of native arteries of right leg with ulceration of other part of lower right leg: Secondary | ICD-10-CM

## 2023-01-11 DIAGNOSIS — R600 Localized edema: Secondary | ICD-10-CM | POA: Diagnosis not present

## 2023-01-12 DIAGNOSIS — S81801A Unspecified open wound, right lower leg, initial encounter: Secondary | ICD-10-CM | POA: Diagnosis not present

## 2023-01-15 ENCOUNTER — Telehealth: Payer: Self-pay

## 2023-01-15 NOTE — Telephone Encounter (Signed)
Pt called stating that his incision is opening and there is a staple that was left from the removal when last seen in office. Dr. Heber [place] at the wound care center recommended that he come back to be evaluated.  Reviewed pt's chart, returned call for clarification, two identifiers used. Appt scheduled for 1/24 with Dr. Donnetta Hutching. Confirmed understanding.

## 2023-01-16 DIAGNOSIS — S81801A Unspecified open wound, right lower leg, initial encounter: Secondary | ICD-10-CM | POA: Diagnosis not present

## 2023-01-17 ENCOUNTER — Encounter: Payer: Self-pay | Admitting: Vascular Surgery

## 2023-01-17 ENCOUNTER — Ambulatory Visit (INDEPENDENT_AMBULATORY_CARE_PROVIDER_SITE_OTHER): Payer: Medicare Other | Admitting: Vascular Surgery

## 2023-01-17 VITALS — BP 128/67 | HR 86 | Temp 98.1°F | Ht 72.0 in

## 2023-01-17 DIAGNOSIS — I70221 Atherosclerosis of native arteries of extremities with rest pain, right leg: Secondary | ICD-10-CM

## 2023-01-17 NOTE — Progress Notes (Signed)
[name] ([id number]) [id number] Page 1 of 8 Visit Report for 01/11/2023 Chief Complaint Document Details Patient Name: Date of Service: [name] 01/11/2023 8:00 A M Medical Record Number: [id number] Patient Account Number: [id number] Date of Birth/Sex: Treating RN: 02/18/1951 (72 y.o. M) Primary Care Provider: Maggie Lewis Other Clinician: Referring Provider: Treating Provider/Extender: [name] in Treatment: 0 Information Obtained from: Patient Chief Complaint 01/11/2023; arterial right lower extremity wound, surgical dehiscence to the medial right lower extremity Electronic Signature(s) Signed: 01/11/2023 11:30:24 AM By: Vincent Shan DO Entered By: [name] on 01/11/2023 09:26:55 -------------------------------------------------------------------------------- HPI Details Patient Name: Date of Service: [name]. 01/11/2023 8:00 A M Medical Record Number: [id number] Patient Account Number: [id number] Date of Birth/Sex: Treating RN: [birth year] (72 y.o. M) Primary Care Provider: Maggie Lewis Other Clinician: Referring Provider: Treating Provider/Extender: [name] in Treatment: 0 History of Present Illness HPI Description: 01/11/2023 [name] is a 72 year old male with a past medical history of peripheral arterial disease status post femoropopliteal bypass graft on 12/12/2022 and prostate cancer that presents to the clinic with 2 wounds to his right lower extremity. He states that in October 2023 he was wearing a boot that rubbed into his leg creating a wound. Since the wound was not healing he had ABIs completed that showed a right ABI of 0.35 and TBI of 0. He was referred to [facility], [name] who did a femoropopliteal bypass graft on the right on 12/12/2022. He reports chronic pain to the wound site. He reports having staples removed to  the medial right leg 1 week ago and the wound site has dehisced. He has not been dressing either of the wound beds. He currently denies systemic signs of infection. Electronic Signature(s) Signed: 01/11/2023 11:30:24 AM By: Vincent Shan DO Entered By: [name] on 01/11/2023 09:39:12 -------------------------------------------------------------------------------- Physical Exam Details Patient Name: Date of Service: [name]. 01/11/2023 8:00 A M Medical Record Number: [id number] Patient Account Number: [id number] Date of Birth/Sex: Treating RN: 23-Jan-1951 (72 y.o. M) Primary Care Provider: Maggie Lewis Other Clinician: OSIAH, Lewis ([id number]) [id number] Page 2 of 8 Referring Provider: Treating Provider/Extender: [name] in Treatment: 0 Constitutional respirations regular, non-labored and within target range for patient.Marland Kitchen Psychiatric pleasant and cooperative. Notes Right lower extremity: T the anterior distal aspect there is an open wound with nonviable surface throughout. T the periwound there is skin breakdown. T the o o o medial right leg there is an incision site with clear dehiscence to the superior portion. There is nonviable surface throughout this wound bed. No signs of soft tissue infection including increased warmth, erythema or purulent drainage to any site. 2+ pitting edema to the knee Dorsalis pedis pulse heard on hand-held Doppler Electronic Signature(s) Signed: 01/11/2023 11:30:24 AM By: Vincent Shan DO Entered By: [name] on 01/11/2023 09:44:04 -------------------------------------------------------------------------------- Physician Orders Details Patient Name: Date of Service: [name]. 01/11/2023 8:00 A M Medical Record Number: [id number] Patient Account Number: [id number] Date of Birth/Sex: Treating RN: 09-Dec-1951 (72 y.o. Erie Noe Primary Care Provider: Maggie Lewis Other Clinician: Referring Provider: Treating Provider/Extender: [name] in Treatment: 0 Verbal / Phone Orders: No Diagnosis Coding ICD-10 Coding Code Description [id number] Atherosclerosis of native arteries of right leg with ulceration of other part of lower leg S81.801A Unspecified open wound, right lower leg, initial encounter  Follow-up Appointments ppointment in 1 week. - w/ [name] next Thursday 01/18/23 @ 2:00 Rm # 9 w/ [name] Return A ppointment in 2 weeks. - w/ Dr. Heber [place] (go ahead and make appt.) Return A Anesthetic (In clinic) Topical Lidocaine 5% applied to wound bed Bathing/ Shower/ Hygiene May shower with protection but do not get wound dressing(s) wet. Protect dressing(s) with water repellant cover (for example, large plastic bag) or a cast cover and may then take shower. - May wash wounds with dial antibacterial soap in the shower if you immediately dress wound after shower. Edema Control - Lymphedema / SCD / Other Elevate legs to the level of the heart or above for 30 minutes daily and/or when sitting for 3-4 times a [name] throughout the [name]. Avoid standing for long periods of time. Wound Treatment Wound #1 - Lower Leg Wound Laterality: Right, Circumferential Cleanser: Soap and Water 1 x Per KDT/26 Days Discharge Instructions: May shower and wash wound with dial antibacterial soap and water prior to dressing change. Cleanser: Wound Cleanser (DME) (Generic) 1 x Per [name]/15 Days Discharge Instructions: Cleanse the wound with wound cleanser prior to applying a clean dressing using gauze sponges, not tissue or cotton balls. Prim Dressing: Hydrofera Blue Ready Transfer Foam, 8x8 (in/in) (DME) (Generic) 1 x Per [name]/15 Days ary Discharge Instructions: Apply to wound bed as instructed Prim Dressing: Santyl Ointment 1 x Per ZTI/45 Days ary Discharge Instructions: Apply nickel thick amount to wound bed  as instructed [name] ([id number]) [id number] Page 3 of 8 Secondary Dressing: ABD Pad, 5x9 (DME) (Generic) 1 x Per [name]/15 Days Discharge Instructions: Apply over primary dressing as directed. Secured With: [employer], 4.5x3.1 (in/yd) (DME) (Generic) 1 x Per [name]/15 Days Discharge Instructions: Secure with Kerlix as directed. Secured With: 15M Medipore H Soft Cloth Surgical T ape, 4 x 10 (in/yd) (DME) (Generic) 1 x Per [name]/15 Days Discharge Instructions: Secure with tape as directed. Secured With: [employer] Size 5, 10 (yds) (DME) (Generic) 1 x Per NKN/39 Days Wound #2 - Lower Leg Wound Laterality: Right, Medial Cleanser: Soap and Water 1 x Per [name]/15 Days Discharge Instructions: May shower and wash wound with dial antibacterial soap and water prior to dressing change. Cleanser: Wound Cleanser (DME) (Generic) 1 x Per [name]/15 Days Discharge Instructions: Cleanse the wound with wound cleanser prior to applying a clean dressing using gauze sponges, not tissue or cotton balls. Prim Dressing: Hydrofera Blue Ready Transfer Foam, 4x5 (in/in) (DME) (Generic) 1 x Per [name]/15 Days ary Discharge Instructions: Apply to wound bed as instructed Prim Dressing: Santyl Ointment 1 x Per [name]/15 Days ary Discharge Instructions: Apply nickel thick amount to wound bed as instructed Secondary Dressing: ABD Pad, 5x9 (DME) (Generic) 1 x Per [name]/15 Days Discharge Instructions: Apply over primary dressing as directed. Secured With: [employer], 4.5x3.1 (in/yd) (DME) (Generic) 1 x Per [name]/15 Days Discharge Instructions: Secure with Kerlix as directed. Secured With: 15M Medipore H Soft Cloth Surgical T ape, 4 x 10 (in/yd) (DME) (Generic) 1 x Per [name]/15 Days Discharge Instructions: Secure with tape as directed. Secured With: [employer] Size 5, 10 (yds) (DME) (Generic) 1 x Per [name]/15 Days Patient Medications llergies: Shellfish Containing Products A Notifications  Medication Indication Start End 01/11/2023 lidocaine DOSE topical 5 % gel - gel topical once daily 01/11/2023 Santyl DOSE 1 - topical 250 unit/gram ointment - apply daily to the wound bed Electronic Signature(s) Signed: 01/11/2023 10:33:10 AM By: Vincent Shan DO Entered By: [name]  on 01/11/2023 10:33:09 -------------------------------------------------------------------------------- Problem List Details Patient Name: Date of Service: [name]. 01/11/2023 8:00 A M Medical Record Number: [id number] Patient Account Number: [id number] Date of Birth/Sex: Treating RN: May 26, 1951 (72 y.o. M) Primary Care Provider: Maggie Lewis Other Clinician: Referring Provider: Treating Provider/Extender: [name] in Treatment: 0 Active Problems ICD-10 Encounter Code Description Active Date MDM Diagnosis I70.238 Atherosclerosis of native arteries of right leg with ulceration of other part of 01/11/2023 No Yes ALCARIO, TINKEY ([id number]) [id number] Page 4 of 8 lower leg S81.801A Unspecified open wound, right lower leg, initial encounter 01/11/2023 No Yes T81.31XA Disruption of external operation (surgical) wound, not elsewhere classified, 01/11/2023 No Yes initial encounter Inactive Problems Resolved Problems Electronic Signature(s) Signed: 01/11/2023 11:30:24 AM By: Vincent Shan DO Entered By: [name] on 01/11/2023 09:25:22 -------------------------------------------------------------------------------- Progress Note Details Patient Name: Date of Service: [name]. 01/11/2023 8:00 A M Medical Record Number: [id number] Patient Account Number: [id number] Date of Birth/Sex: Treating RN: 11-03-1951 (72 y.o. M) Primary Care Provider: Maggie Lewis Other Clinician: Referring Provider: Treating Provider/Extender: [name] in Treatment: 0 Subjective Chief  Complaint Information obtained from Patient 01/11/2023; arterial right lower extremity wound, surgical dehiscence to the medial right lower extremity History of Present Illness (HPI) 01/11/2023 [name] is a 72 year old male with a past medical history of peripheral arterial disease status post femoropopliteal bypass graft on 12/12/2022 and prostate cancer that presents to the clinic with 2 wounds to his right lower extremity. He states that in October 2023 he was wearing a boot that rubbed into his leg creating a wound. Since the wound was not healing he had ABIs completed that showed a right ABI of 0.35 and TBI of 0. He was referred to [facility], [name] who did a femoropopliteal bypass graft on the right on 12/12/2022. He reports chronic pain to the wound site. He reports having staples removed to the medial right leg 1 week ago and the wound site has dehisced. He has not been dressing either of the wound beds. He currently denies systemic signs of infection. Patient History Information obtained from Patient, Chart. Allergies Shellfish Containing Products Family History Unknown History. Social History Former smoker, Marital Status - Single, Alcohol Use - Never, Drug Use - No History, Caffeine Use - Rarely. Medical History Cardiovascular Patient has history of Hypertension, Peripheral Arterial Disease, Peripheral Venous Disease Hospitalization/Surgery History - femoral-tibial bypass graft right 12/12/22. - radical prostatectomy 2011. - tonsillectomy age 43. Medical A Surgical History Notes nd Cardiovascular Hyperlipidemia Gastrointestinal GERD Oncologic prostate ca [name], [name] ([id number]) [id number] Page 5 of 8 Review of Systems (ROS) Constitutional Symptoms (General Health) Denies complaints or symptoms of Fatigue, Fever, Chills, Marked Weight Change. Eyes Complains or has symptoms of Glasses / Contacts. Denies complaints or  symptoms of Dry Eyes, Vision Changes. Ear/Nose/Mouth/Throat Denies complaints or symptoms of Chronic sinus problems or rhinitis. Respiratory Denies complaints or symptoms of Chronic or frequent coughs, Shortness of Breath. Endocrine Denies complaints or symptoms of Heat/cold intolerance. Genitourinary Denies complaints or symptoms of Frequent urination. Integumentary (Skin) Complains or has symptoms of Wounds. Musculoskeletal Denies complaints or symptoms of Muscle Pain, Muscle Weakness. Neurologic Denies complaints or symptoms of Numbness/parasthesias. Psychiatric Denies complaints or symptoms of Claustrophobia. Objective Constitutional respirations regular, non-labored and within target range for patient.. Vitals Time Taken: 8:12 AM, Temperature: 97.8 F, Pulse: 88 bpm, Respiratory Rate: 17 breaths/min, Blood Pressure: 155/74 mmHg. Psychiatric pleasant  and cooperative. General Notes: Right lower extremity: T the anterior distal aspect there is an open wound with nonviable surface throughout. T the periwound there is skin o o breakdown. T the medial right leg there is an incision site with clear dehiscence to the superior portion. There is nonviable surface throughout this wound bed. o No signs of soft tissue infection including increased warmth, erythema or purulent drainage to any site. 2+ pitting edema to the knee Dorsalis pedis pulse heard on hand-held Doppler Integumentary (Hair, Skin) Wound #1 status is Open. Original cause of wound was Trauma. The date acquired was: 09/13/2022. The wound is located on the Right,Circumferential Lower Leg. The wound measures 9cm length x 26cm width x 0.2cm depth; 183.783cm^2 area and 36.757cm^3 volume. There is Fat Layer (Subcutaneous Tissue) exposed. There is no tunneling or undermining noted. There is a large amount of serosanguineous drainage noted. The wound margin is distinct with the outline attached to the wound base. There is small  (1-33%) red, pink granulation within the wound bed. There is a large (67-100%) amount of necrotic tissue within the wound bed including Eschar and Adherent Slough. The periwound skin appearance exhibited: Erythema. The periwound skin appearance did not exhibit: Callus, Crepitus, Excoriation, Induration, Rash, Scarring, Dry/Scaly, Maceration, Atrophie Blanche, Cyanosis, Ecchymosis, Hemosiderin Staining, Mottled, Pallor, Rubor. The surrounding wound skin color is noted with erythema which is circumferential. Periwound temperature was noted as No Abnormality. The periwound has tenderness on palpation. Wound #2 status is Open. Original cause of wound was Surgical Injury. The date acquired was: 01/03/2023. The wound is located on the Right,Medial Lower Leg. The wound measures 7cm length x 1.7cm width x 0.4cm depth; 9.346cm^2 area and 3.738cm^3 volume. There is Fat Layer (Subcutaneous Tissue) exposed. There is no tunneling or undermining noted. There is a medium amount of serosanguineous drainage noted. The wound margin is distinct with the outline attached to the wound base. There is no granulation within the wound bed. There is a large (67-100%) amount of necrotic tissue within the wound bed including Eschar and Adherent Slough. The periwound skin appearance exhibited: Maceration. The periwound skin appearance did not exhibit: Callus, Crepitus, Excoriation, Induration, Rash, Scarring, Dry/Scaly, Atrophie Blanche, Cyanosis, Ecchymosis, Hemosiderin Staining, Mottled, Pallor, Rubor, Erythema. Periwound temperature was noted as No Abnormality. The periwound has tenderness on palpation. 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 He has had a femoropopliteal bypass graft to help with revascularization. Pedal pulses heard on Doppler. Unfortunately has had dehiscence of his surgical incision site. I recommended he let Dr. EMarrion Coyoffice know about this. For now I recommended Santyl and Hydrofera Blue to the wound beds. He has significant swelling on exam. This is likely what led to the dehiscence of the surgical site. I did recommend a Tubigrip for now. Follow-up in 1 week. [address]RTYRIN, [name]([id number] Page 6 of 8 Follow-up Appointments: Return Appointment in 1 week. - w/ Dr. [place]Heber [place] Thursday 01/18/23 @ 2:00 Rm # 9 w/ LAllayne ButcherReturn Appointment in 2 weeks. - w/ Dr. HHeber [place](go ahead and make appt.) Anesthetic: (In clinic) Topical Lidocaine 5% applied to wound bed Bathing/ Shower/ Hygiene: May shower with protection but do not get wound dressing(s) wet.  Protect dressing(s) with water repellant cover (for example, large plastic bag) or a cast cover and may then take shower. - May wash wounds with dial antibacterial soap in the shower if you immediately dress wound after shower. Edema Control - Lymphedema / SCD / Other: Elevate legs to the level of the heart or above for 30 minutes daily and/or when sitting for 3-4 times a [name] throughout the [name]. Avoid standing for long periods of time. The following medication(s) was prescribed: lidocaine topical 5 % gel gel topical once daily was prescribed at facility WOUND #1: - Lower Leg Wound Laterality: Right, Circumferential Cleanser: Soap and Water 1 x Per [name]/15 Days Discharge Instructions: May shower and wash wound with dial antibacterial soap and water prior to dressing change. Cleanser: Wound Cleanser (DME) (Generic) 1 x Per [name]/15 Days Discharge Instructions: Cleanse the wound with wound cleanser prior to applying a clean dressing using  gauze sponges, not tissue or cotton balls. Prim Dressing: Hydrofera Blue Ready Transfer Foam, 8x8 (in/in) (DME) (Generic) 1 x Per [name]/15 Days ary Discharge Instructions: Apply to wound bed as instructed Prim Dressing: Santyl Ointment 1 x Per [name]/15 Days ary Discharge Instructions: Apply nickel thick amount to wound bed as instructed Secondary Dressing: ABD Pad, 5x9 (DME) (Generic) 1 x Per [name]/15 Days Discharge Instructions: Apply over primary dressing as directed. Secured With: [employer], 4.5x3.1 (in/yd) (DME) (Generic) 1 x Per [name]/15 Days Discharge Instructions: Secure with Kerlix as directed. Secured With: 54M Medipore H Soft Cloth Surgical T ape, 4 x 10 (in/yd) (DME) (Generic) 1 x Per [name]/15 Days Discharge Instructions: Secure with tape as directed. Secured With: [employer] Size 5, 10 (yds) (DME) (Generic) 1 x Per [name]/15 Days WOUND #2: - Lower Leg Wound Laterality: Right, Medial Cleanser: Soap and Water 1 x Per [name]/15 Days Discharge Instructions: May shower and wash wound with dial antibacterial soap and water prior to dressing change. Cleanser: Wound Cleanser (DME) (Generic) 1 x Per [name]/15 Days Discharge Instructions: Cleanse the wound with wound cleanser prior to applying a clean dressing using gauze sponges, not tissue or cotton balls. Prim Dressing: Hydrofera Blue Ready Transfer Foam, 4x5 (in/in) (DME) (Generic) 1 x Per [name]/15 Days ary Discharge Instructions: Apply to wound bed as instructed Prim Dressing: Santyl Ointment 1 x Per [name]/15 Days ary Discharge Instructions: Apply nickel thick amount to wound bed as instructed Secondary Dressing: ABD Pad, 5x9 (DME) (Generic) 1 x Per [name]/15 Days Discharge Instructions: Apply over primary dressing as directed. Secured With: [employer], 4.5x3.1 (in/yd) (DME) (Generic) 1 x Per [name]/15 Days Discharge Instructions: Secure with Kerlix as directed. Secured With: 54M [employer] H Soft Cloth Surgical T ape, 4 x 10 (in/yd) (DME)  (Generic) 1 x Per [name]/15 Days Discharge Instructions: Secure with tape as directed. Secured With: [employer] Size 5, 10 (yds) (DME) (Generic) 1 x Per [name]/15 Days 1. Santyl and Hydrofera Blue 2. Tubigrip 3. Follow-up in 1 week Electronic Signature(s) Signed: 01/11/2023 11:30:24 AM By: Vincent Shan DO Entered By: [name] on 01/11/2023 09:44:21 -------------------------------------------------------------------------------- HxROS Details Patient Name: Date of Service: [name]. 01/11/2023 8:00 A M Medical Record Number: [id number] Patient Account Number: [id number] Date of Birth/Sex: Treating RN: December 01, 1951 (72 y.o. Erie Noe Primary Care Provider: Maggie Lewis Other Clinician: Referring Provider: Treating Provider/Extender: [name] in Treatment: 0 Information Obtained From Patient Chart Constitutional Symptoms (General Health) Complaints and Symptoms: Negative for: Fatigue; Fever; Chills; Marked Weight Change Lewis, [name] ([id number]) [id number] Page 7 of 8 Eyes Complaints and Symptoms: Positive for: Glasses / Contacts Negative for: Dry Eyes; Vision Changes Ear/Nose/Mouth/Throat Complaints and Symptoms: Negative for: Chronic sinus problems or rhinitis Respiratory Complaints and Symptoms: Negative for: Chronic or frequent coughs; Shortness of Breath Endocrine Complaints and Symptoms: Negative for: Heat/cold intolerance Genitourinary Complaints and Symptoms: Negative for: Frequent urination Integumentary (Skin) Complaints and Symptoms: Positive for: Wounds Musculoskeletal Complaints and Symptoms: Negative for: Muscle Pain; Muscle Weakness Neurologic Complaints and Symptoms: Negative for: Numbness/parasthesias Psychiatric Complaints and Symptoms: Negative for: Claustrophobia Hematologic/Lymphatic Cardiovascular Medical History: Positive for: Hypertension;  Peripheral Arterial Disease; Peripheral Venous Disease Past Medical History Notes: Hyperlipidemia Gastrointestinal Medical History: Past Medical History Notes: GERD Immunological Oncologic Medical History: Past Medical History Notes: prostate ca Immunizations Pneumococcal Vaccine: Received Pneumococcal Vaccination: Yes Received Pneumococcal Vaccination On or After 60th Birthday: Yes Implantable Devices None [name], [name] ([id number]) [id number] Page 8 of 8 Hospitalization / Surgery History Type of Hospitalization/Surgery femoral-tibial bypass graft right 12/12/22 radical prostatectomy 2011 tonsillectomy age 23 Family and Social History Unknown History: Yes; Former smoker; Marital Status - Single; Alcohol Use: Never; Drug Use: No History; Caffeine Use: Rarely; Financial Concerns: No; Food, Clothing or Shelter Needs: No; Support System Lacking: No; Transportation Concerns: No Electronic Signature(s) Signed: 01/11/2023 11:30:24 AM By: Vincent Shan DO Signed: 01/17/2023 4:09:44 PM By: Rhae Hammock RN Entered By: Rhae Hammock on 01/10/2023 16:08:45 -------------------------------------------------------------------------------- SuperBill Details Patient Name: Date of Service: [name]. 01/11/2023 Medical Record Number: [id number] Patient Account Number: [id number] Date of Birth/Sex: Treating RN: 1951/11/21 (72 y.o. 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 of other part of lower leg S81.801A Unspecified open wound, right lower leg, initial encounter T81.31XA Disruption of external operation (surgical) wound, not elsewhere classified, initial encounter Physician Procedures : CPT4 Code  Description Modifier [id number] - WC PHYS LEVEL 4 - NEW PT ICD-10 Diagnosis Description I70.238 Atherosclerosis of native arteries of right leg with ulceration of other part of lower leg S81.801A Unspecified open wound, right lower leg,  initial encounter T81.31XA Disruption of external operation (surgical) wound, not elsewhere classified, initial encounter Quantity: 1 Electronic Signature(s) Signed: 01/11/2023 11:30:24 AM By: Vincent Shan DO Entered By: [name] on 01/11/2023 09:44:34

## 2023-01-17 NOTE — Progress Notes (Signed)
   [facility] [place]  Patient name: [name] MRN: [id number] DOB: 1951/10/14 Sex: male  REASON FOR VISIT: Follow-up wounds right lower extremity  HPI: [name] is a 72 y.o. male here today for follow-up.  He is status post composite right femoral to posterior tibial bypass.  He was seen for staple removal several weeks ago.  He has had slight separation of the area in his mid calf and is here today for follow-up  Current Outpatient Medications  Medication Sig Dispense Refill   aspirin EC 81 MG tablet Take 81 mg by mouth daily.     atorvastatin (LIPITOR) 40 MG tablet Take 1 tablet (40 mg total) by mouth every morning. 30 tablet 3   esomeprazole (NEXIUM) 20 MG capsule Take 20 mg by mouth daily.     gabapentin (NEURONTIN) 100 MG capsule Take 1 capsule (100 mg total) by mouth 3 (three) times daily. (Patient taking differently: Take 100 mg by mouth at bedtime as needed (Nerve pain).) 90 capsule 3   lisinopril-hydrochlorothiazide (PRINZIDE,ZESTORETIC) 10-12.5 MG tablet Take 1 tablet by mouth every other day.      meloxicam (MOBIC) 15 MG tablet Take 1 tablet by mouth once daily 30 tablet 0   metoprolol tartrate (LOPRESSOR) 25 MG tablet Take 0.5 tablets (12.5 mg total) by mouth 2 (two) times daily. 30 tablet 0   Multiple Vitamins-Minerals (CENTRUM SILVER PO) Take 1 tablet by mouth daily.      oxybutynin (DITROPAN-XL) 10 MG 24 hr tablet Take 10 mg by mouth daily.     SPIKEVAX syringe      oxyCODONE-acetaminophen (PERCOCET/ROXICET) 5-325 MG tablet Take 1 tablet by mouth every 6 (six) hours as needed for moderate pain. (Patient not taking: Reported on 01/03/2023) 20 tablet 0   Current Facility-Administered Medications  Medication Dose Route Frequency Provider Last Rate Last Admin   betamethasone acetate-betamethasone sodium phosphate (CELESTONE) injection 3 mg  3 mg Intra-articular Once Daylene Katayama M, DPM        betamethasone acetate-betamethasone sodium phosphate (CELESTONE) injection 3 mg  3 mg Intra-articular Once Edrick Kins, DPM         PHYSICAL EXAM: Vitals:   01/17/23 0859  BP: 128/67  Pulse: 86  Temp: 98.1 F (36.7 C)  Height: 6' (1.829 m)    GENERAL: The patient is a well-nourished male, in no acute distress. The vital signs are documented above. His groin incision looks quite good.  He has a very small superficial area of opening on the lateral aspect at the para pole.  He has chronic swelling in both lower legs.  He does have an area approximately 3 to 4 cm in length that is separated approximately 1 to 2 cm in width in his proximal calf.  He does have good granulation tissue at this level.  By hand-held Doppler he has excellent biphasic flow in the posterior tibial at the ankle  MEDICAL ISSUES: Stable overall.  He will continue with local wound care with saline.  He is seeing the wound center who will be able to treat this a small separation in his incision as well.  We will see him again in 1 month as scheduled   Rosetta Posner, MD FACS [facility] [phone]  Note: Portions of this report may have been transcribed using voice recognition software.  Every effort has been made to ensure accuracy; however, inadvertent computerized transcription errors may still be present.

## 2023-01-17 NOTE — Progress Notes (Signed)
Lewis, Vincent ([id number]) [phone].pdf Page 1 of 4 Visit Report for 01/11/2023 Abuse Risk Screen Details Patient Name: Date of Service: [name]. 01/11/2023 8:00 A M Medical Record Number: [id number] Patient Account Number: [id number] Date of Birth/Sex: Treating RN: [birth year] (72 y.o. [name], Vincent Primary Care Luciana Cammarata: Maggie Font Other Clinician: Referring Mattix Imhof: Treating Obed Samek/Extender: Daiva Eves in Treatment: 0 Abuse Risk Screen Items Answer ABUSE RISK SCREEN: Has anyone close to you tried to hurt or harm you recentlyo No Do you feel uncomfortable with anyone in your familyo No Has anyone forced you do things that you didnt want to doo No Electronic Signature(s) Signed: 01/17/2023 4:09:44 PM By: Rhae Hammock RN Entered By: Rhae Hammock on 01/11/2023 08:08:43 -------------------------------------------------------------------------------- Activities of Daily Living Details Patient Name: Date of Service: Vincent Eng R. 01/11/2023 8:00 A M Medical Record Number: [id number] Patient Account Number: [id number] Date of Birth/Sex: Treating RN: 1951-01-27 (72 y.o. Erie Noe Primary Care Letzy Gullickson: Maggie Font Other Clinician: Referring Arkeem Harts: Treating Pius Byrom/Extender: Daiva Eves in Treatment: 0 Activities of Daily Living Items Answer Activities of Daily Living (Please select one for each item) Drive Automobile Completely Able T Medications ake Completely Able Use T elephone Completely Able Care for Appearance Completely Able Use T oilet Completely Able Bath / Shower Completely Able Dress Self Completely Able Feed Self Completely Able Walk Completely Able Get In / Out Bed Completely Able Housework Completely Able Prepare Meals Completely [place] for Self Completely Able Electronic  Signature(s) Signed: 01/17/2023 4:09:44 PM By: Rhae Hammock RN Entered By: Rhae Hammock on 01/11/2023 08:08:59 Enrique, Tycen R ([id number]) [id number] Page 2 of 4 -------------------------------------------------------------------------------- Education Screening Details Patient Name: Date of Service: [name]. 01/11/2023 8:00 A M Medical Record Number: [id number] Patient Account Number: [id number] Date of Birth/Sex: Treating RN: [birth year] (72 y.o. [name], Vincent Primary Care Mancil Pfenning: Maggie Font Other Clinician: Referring China Deitrick: Treating Chaundra Abreu/Extender: Daiva Eves in Treatment: 0 Primary Learner Assessed: Patient Learning Preferences/Education Level/Primary Language Learning Preference: Explanation, Demonstration, Communication Board, Printed Material Highest Education Level: High School Preferred Language: English Cognitive Barrier Language Barrier: No Translator Needed: No Memory Deficit: No Emotional Barrier: No Cultural/Religious Beliefs Affecting Medical Care: No Physical Barrier Impaired Vision: Yes Glasses Impaired Hearing: No Decreased Hand dexterity: No Knowledge/Comprehension Knowledge Level: High Comprehension Level: High Ability to understand written instructions: High Ability to understand verbal instructions: High Motivation Anxiety Level: Calm Cooperation: Cooperative Education Importance: Denies Need Interest in Health Problems: Asks Questions Perception: Coherent Willingness to Engage in Self-Management High Activities: Readiness to Engage in Self-Management High Activities: Electronic Signature(s) Signed: 01/17/2023 4:09:44 PM By: Rhae Hammock RN Entered By: Rhae Hammock on 01/11/2023 08:09:21 -------------------------------------------------------------------------------- Fall Risk Assessment Details Patient Name: Date of Service: [address] R. 01/11/2023 8:00 A M Medical Record Number: [id number] Patient Account Number: [id number] Date of Birth/Sex: Treating RN: 1951/12/10 (71 y.o. Erie Noe Primary Care Kieu Quiggle: Maggie Font Other Clinician: Referring Brick Ketcher: Treating Lizzy Hamre/Extender: Daiva Eves in Treatment: 0 Fall Risk Assessment Items Have you had 2 or more falls in the last 12 monthso 0 No CAMDYN, BESKE R ([id number]) [id number] Page 3 of 4 Have you had any fall that resulted in injury in the last 12 monthso 0 No FALLS RISK SCREEN History of falling - immediate or within 3 months 0  No Secondary diagnosis (Do you have 2 or more medical diagnoseso) 0 No Ambulatory aid None/bed rest/wheelchair/nurse 0 No Crutches/cane/walker 0 No Furniture 0 No Intravenous therapy Access/Saline/Heparin Lock 0 No Gait/Transferring Normal/ bed rest/ wheelchair 0 No Weak (short steps with or without shuffle, stooped but able to lift head while walking, may seek 0 No support from furniture) Impaired (short steps with shuffle, may have difficulty arising from chair, head down, impaired 0 No balance) Mental Status Oriented to own ability 0 No Electronic Signature(s) Signed: 01/17/2023 4:09:44 PM By: Rhae Hammock RN Entered By: Rhae Hammock on 01/11/2023 08:09:33 -------------------------------------------------------------------------------- Foot Assessment Details Patient Name: Date of Service: Vincent Eng R. 01/11/2023 8:00 A M Medical Record Number: [id number] Patient Account Number: [id number] Date of Birth/Sex: Treating RN: 1951/03/15 (72 y.o. Erie Noe Primary Care Lawton Dollinger: Maggie Font Other Clinician: Referring Huan Pollok: Treating Kalyssa Anker/Extender: Candis Schatz Weeks in Treatment: 0 Foot Assessment Items Site Locations + = Sensation present, - = Sensation absent, C = Callus, U  = Ulcer R = Redness, W = Warmth, M = Maceration, PU = Pre-ulcerative lesion F = Fissure, S = Swelling, D = Dryness Assessment Right: Left: Other Deformity: No No Prior Foot Ulcer: No No Prior Amputation: No No Charcot Joint: No No Ambulatory Status: Ambulatory Without Help GaitBRENTLY, VOORHIS R ([id number]) [id number] Page 4 of 4 Electronic Signature(s) Signed: 01/17/2023 4:09:44 PM By: Rhae Hammock RN Entered By: Rhae Hammock on 01/11/2023 08:13:34 -------------------------------------------------------------------------------- Nutrition Risk Screening Details Patient Name: Date of Service: Vincent Eng R. 01/11/2023 8:00 A M Medical Record Number: [id number] Patient Account Number: [id number] Date of Birth/Sex: Treating RN: [birth year] (72 y.o. Erie Noe Primary Care Gianny Killman: Maggie Font Other Clinician: Referring Tamy Accardo: Treating Xara Paulding/Extender: Candis Schatz Weeks in Treatment: 0 Height (in): Weight (lbs): Body Mass Index (BMI): Nutrition Risk Screening Items Score Screening NUTRITION RISK SCREEN: I have an illness or condition that made me change the kind and/or amount of food I eat 0 No I eat fewer than two meals per day 0 No I eat few fruits and vegetables, or milk products 0 No I have three or more drinks of beer, liquor or wine almost every day 0 No I have tooth or mouth problems that make it hard for me to eat 0 No I don't always have enough money to buy the food I need 0 No I eat alone most of the time 0 No I take three or more different prescribed or over-the-counter drugs a day 0 No Without wanting to, I have lost or gained 10 pounds in the last six months 0 No I am not always physically able to shop, cook and/or feed myself 0 No Nutrition Protocols Good Risk Protocol 0 No interventions needed Moderate Risk Protocol High Risk Proctocol Risk Level: Good  Risk Score: 0 Electronic Signature(s) Signed: 01/17/2023 4:09:44 PM By: Rhae Hammock RN Entered By: Rhae Hammock on 01/11/2023 08:09:41

## 2023-01-17 NOTE — Progress Notes (Signed)
[name], [name] ([id number]) [id number] Page 1 of 8 Visit Report for 01/11/2023 Allergy List Details Patient Name: Date of Service: [name] 01/11/2023 8:00 A M Medical Record Number: [id number] Patient Account Number: [id number] Date of Birth/Sex: Treating RN: [birth year] (72 y.o. Erie Noe Primary Care Avyukt Cimo: Maggie Font Other Clinician: Referring Estrella Alcaraz: Treating Devun Anna/Extender: Candis Schatz Weeks in Treatment: 0 Allergies Active Allergies Shellfish Containing Products Allergy Notes Electronic Signature(s) Signed: 01/17/2023 4:09:44 PM By: Rhae Hammock RN Entered By: Rhae Hammock on 01/10/2023 16:06:38 -------------------------------------------------------------------------------- Arrival Information Details Patient Name: Date of Service: Vincent Eng Lewis. 01/11/2023 8:00 A M Medical Record Number: [id number] Patient Account Number: [id number] Date of Birth/Sex: Treating RN: 1951-06-11 (72 y.o. Erie Noe Primary Care Petr Bontempo: Maggie Font Other Clinician: Referring Remus Hagedorn: Treating Julious Langlois/Extender: Daiva Eves in Treatment: 0 Visit Information Patient Arrived: Ambulatory Arrival Time: 08:08 Accompanied By: self Transfer Assistance: None Patient Identification Verified: Yes Secondary Verification Process Completed: Yes Patient Requires Transmission-Based Precautions: No Patient Has Alerts: No Electronic Signature(s) Signed: 01/17/2023 4:09:44 PM By: Rhae Hammock RN Entered By: Rhae Hammock on 01/11/2023 08:08:34 -------------------------------------------------------------------------------- Lower Extremity Assessment Details Patient Name: Date of Service: Vincent Eng Lewis. 01/11/2023 8:00 A M Medical Record Number: [id number] Patient Account Number: [id number] [name], [name] ([id number])  [id number] Page 2 of 8 Date of Birth/Sex: Treating RN: [birth year] (72 y.o. [name], [name] Primary Care Javaeh Muscatello: Maggie Font Other Clinician: Referring Minette Manders: Treating Jermine Bibbee/Extender: Candis Schatz Weeks in Treatment: 0 Edema Assessment Assessed: [Left: No] [Right: Yes] Edema: [Left: Ye] [Right: s] Calf Left: Right: Point of Measurement: 42 cm From Medial Instep 46 cm Ankle Left: Right: Point of Measurement: 9 cm From Medial Instep 27 cm Knee To Floor Left: Right: From Medial Instep 46 cm Vascular Assessment Pulses: Dorsalis Pedis Palpable: [Right:Yes] Posterior Tibial Palpable: [Right:Yes] Electronic Signature(s) Signed: 01/17/2023 4:09:44 PM By: Rhae Hammock RN Entered By: Rhae Hammock on 01/11/2023 08:24:08 -------------------------------------------------------------------------------- Multi Wound Chart Details Patient Name: Date of Service: Vincent Eng Lewis. 01/11/2023 8:00 A M Medical Record Number: [id number] Patient Account Number: [id number] Date of Birth/Sex: Treating RN: 1951-06-27 (72 y.o. M) Primary Care Armetta Henri: Maggie Font Other Clinician: Referring Alayshia Marini: Treating Thekla Colborn/Extender: Daiva Eves in Treatment: 0 Vital Signs Height(in): Pulse(bpm): 88 Weight(lbs): Blood Pressure(mmHg): 155/74 Body Mass Index(BMI): Temperature(F): 97.8 Respiratory Rate(breaths/min): 17 [1:Photos:] [N/A:N/A] Right, Circumferential Lower Leg Right, Medial Lower Leg N/A Wound Location: Trauma Surgical Injury N/A Wounding Event: Arterial Insufficiency Ulcer Dehisced Wound N/A Primary Etiology: [name], [name] ([id number]) [id number] Page 3 of 8 Hypertension, Peripheral Arterial Hypertension, Peripheral Arterial N/A Comorbid History: Disease, Peripheral Venous Disease Disease, Peripheral Venous Disease 09/13/2022 01/03/2023 N/A Date  Acquired: 0 0 N/A Weeks of Treatment: Open Open N/A Wound Status: No No N/A Wound Recurrence: 9x26x0.2 7x1.7x0.4 N/A Measurements L x W x D (cm) 183.783 9.346 N/A A (cm) : rea 36.757 3.738 N/A Volume (cm) : Full Thickness With Exposed Support Full Thickness With Exposed Support N/A Classification: Structures Structures Large Medium N/A Exudate A mount: Serosanguineous Serosanguineous N/A Exudate Type: red, brown red, brown N/A Exudate Color: Distinct, outline attached Distinct, outline attached N/A Wound Margin: Small (1-33%) None Present (0%) N/A Granulation Amount: Red, Pink N/A N/A Granulation Quality: Large (67-100%) Large (67-100%) N/A Necrotic Amount: Eschar, Adherent Slough Eschar, Adherent Slough N/A Necrotic Tissue: Fat Layer (Subcutaneous Tissue): Yes Fat Layer (Subcutaneous Tissue): Yes  N/A Exposed Structures: Fascia: No Fascia: No Tendon: No Tendon: No Muscle: No Muscle: No Joint: No Joint: No Bone: No Bone: No None Small (1-33%) N/A Epithelialization: Excoriation: No Excoriation: No N/A Periwound Skin Texture: Induration: No Induration: No Callus: No Callus: No Crepitus: No Crepitus: No Rash: No Rash: No Scarring: No Scarring: No Maceration: No Maceration: Yes N/A Periwound Skin Moisture: Dry/Scaly: No Dry/Scaly: No Erythema: Yes Atrophie Blanche: No N/A Periwound Skin Color: Atrophie Blanche: No Cyanosis: No Cyanosis: No Ecchymosis: No Ecchymosis: No Erythema: No Hemosiderin Staining: No Hemosiderin Staining: No Mottled: No Mottled: No Pallor: No Pallor: No Rubor: No Rubor: No Circumferential N/A N/A Erythema Location: No Abnormality No Abnormality N/A Temperature: Yes Yes N/A Tenderness on Palpation: Treatment Notes Electronic Signature(s) Signed: 01/11/2023 11:30:24 AM By: Kalman Shan DO Entered By: Kalman Shan on 01/11/2023  09:25:27 -------------------------------------------------------------------------------- Multi-Disciplinary Care Plan Details Patient Name: Date of Service: Vincent Eng Lewis. 01/11/2023 8:00 A M Medical Record Number: [id number] Patient Account Number: [id number] Date of Birth/Sex: Treating RN: [birth year] (72 y.o. Erie Noe Primary Care Roy Tokarz: Maggie Font Other Clinician: Referring Savi Lastinger: Treating Renelda Kilian/Extender: Daiva Eves in Treatment: 0 Active Inactive Orientation to the Wound Care Program Nursing Diagnoses: Knowledge deficit related to the wound healing center program Goals: Patient/caregiver will verbalize understanding of the [place] ([id number]) [id number] Page 4 of 8 Date Initiated: 01/11/2023 Target Resolution Date: 02/01/2023 Goal Status: Active Interventions: Provide education on orientation to the wound center Notes: Wound/Skin Impairment Nursing Diagnoses: Impaired tissue integrity Knowledge deficit related to ulceration/compromised skin integrity Goals: Patient will have a decrease in wound volume by X% from date: (specify in notes) Date Initiated: 01/11/2023 Target Resolution Date: 02/01/2023 Goal Status: Active Patient/caregiver will verbalize understanding of skin care regimen Date Initiated: 01/11/2023 Target Resolution Date: 02/03/2023 Goal Status: Active Ulcer/skin breakdown will have a volume reduction of 30% by week 4 Date Initiated: 01/11/2023 Target Resolution Date: 02/01/2023 Goal Status: Active Interventions: Assess patient/caregiver ability to obtain necessary supplies Assess patient/caregiver ability to perform ulcer/skin care regimen upon admission and as needed Assess ulceration(s) every visit Notes: Electronic Signature(s) Signed: 01/17/2023 4:09:44 PM By: Rhae Hammock RN Entered By: Rhae Hammock on 01/11/2023  08:59:35 -------------------------------------------------------------------------------- Pain Assessment Details Patient Name: Date of Service: Vincent Eng Lewis. 01/11/2023 8:00 A M Medical Record Number: [id number] Patient Account Number: [id number] Date of Birth/Sex: Treating RN: 1951-05-14 (72 y.o. Erie Noe Primary Care Teandre Hamre: Maggie Font Other Clinician: Referring Enjoli Tidd: Treating Jaionna Weisse/Extender: Daiva Eves in Treatment: 0 Active Problems Location of Pain Severity and Description of Pain Patient Has Paino Yes Site Locations Pain Location: Pain in Ulcers With Dressing Change: Yes Duration of the Pain. Constant / Intermittento Intermittent Rate the pain. Current Pain Level: 5 Worst Pain Level: 10 Least Pain Level: 0 Tolerable Pain Level: 5 [name], [name] ([id number]) [id number] Page 5 of 8 Character of Pain Describe the Pain: Aching Pain Management and Medication Current Pain Management: Medication: No Cold Application: No Rest: No Massage: No Activity: No T.E.N.S.: No Heat Application: No Leg drop or elevation: No Is the Current Pain Management Adequate: Adequate How does your wound impact your activities of daily livingo Sleep: No Bathing: No Appetite: No Relationship With Others: No Bladder Continence: No Emotions: No Bowel Continence: No Work: No Toileting: No Drive: No Dressing: No Hobbies: No Electronic Signature(s) Signed: 01/17/2023 4:09:44 PM By: Rhae Hammock RN Entered By: Rhae Hammock  on 01/11/2023 08:12:38 -------------------------------------------------------------------------------- Patient/Caregiver Education Details Patient Name: Date of Service: [name] 1/18/2024andnbsp8:00 A M Medical Record Number: [id number] Patient Account Number: [id number] Date of Birth/Gender: Treating RN: 1951-04-10 (72 y.o. Erie Noe Primary Care Physician: Maggie Font Other Clinician: Referring Physician: Treating Physician/Extender: Daiva Eves in Treatment: 0 Education Assessment Education Provided To: Patient Education Topics Provided Wound/Skin Impairment: Methods: Explain/Verbal Responses: Reinforcements needed, State content correctly Electronic Signature(s) Signed: 01/17/2023 4:09:44 PM By: Rhae Hammock RN Entered By: Rhae Hammock on 01/11/2023 08:59:45 -------------------------------------------------------------------------------- Wound Assessment Details Patient Name: Date of Service: Vincent Eng Lewis. 01/11/2023 8:00 A M Medical Record Number: [id number] Patient Account Number: [id number] Date of Birth/Sex: Treating RN: [birth year] (72 y.o. [name], [name] Primary Care Jeshurun [place]: Maggie Font Other Clinician: Referring Jerry Clyne: Treating Ethal Gotay/Extender: Daiva Eves in Treatment: [name], [name] ([id number]) [id number] Page 6 of 8 Wound Status Wound Number: 1 Primary Arterial Insufficiency Ulcer Etiology: Wound Location: Right, Circumferential Lower Leg Wound Status: Open Wounding Event: Trauma Comorbid Hypertension, Peripheral Arterial Disease, Peripheral Venous Date Acquired: 09/13/2022 History: Disease Weeks Of Treatment: 0 Clustered Wound: No Photos Wound Measurements Length: (cm) 9 Width: (cm) 26 Depth: (cm) 0.2 Area: (cm) 183.783 Volume: (cm) 36.757 % Reduction in Area: % Reduction in Volume: Epithelialization: None Tunneling: No Undermining: No Wound Description Classification: Full Thickness With Exposed Suppor Wound Margin: Distinct, outline attached Exudate Amount: Large Exudate Type: Serosanguineous Exudate Color: red, brown t Structures Foul Odor After Cleansing: No Slough/Fibrino Yes Wound Bed Granulation Amount: Small (1-33%) Exposed  Structure Granulation Quality: Red, Pink Fascia Exposed: No Necrotic Amount: Large (67-100%) Fat Layer (Subcutaneous Tissue) Exposed: Yes Necrotic Quality: Eschar, Adherent Slough Tendon Exposed: No Muscle Exposed: No Joint Exposed: No Bone Exposed: No Periwound Skin Texture Texture Color No Abnormalities Noted: No No Abnormalities Noted: No Callus: No Atrophie Blanche: No Crepitus: No Cyanosis: No Excoriation: No Ecchymosis: No Induration: No Erythema: Yes Rash: No Erythema Location: Circumferential Scarring: No Hemosiderin Staining: No Mottled: No Moisture Pallor: No No Abnormalities Noted: No Rubor: No Dry / Scaly: No Maceration: No Temperature / Pain Temperature: No Abnormality Tenderness on Palpation: Yes Electronic Signature(s) Signed: 01/17/2023 4:09:44 PM By: Rhae Hammock RN Entered By: Rhae Hammock on 01/11/2023 08:28:55 [name], [name] ([id number]) [id number] Page 7 of 8 -------------------------------------------------------------------------------- Wound Assessment Details Patient Name: Date of Service: [name]. 01/11/2023 8:00 A M Medical Record Number: [id number] Patient Account Number: [id number] Date of Birth/Sex: Treating RN: Dec 06, 1951 (72 y.o. [name], [name] Primary Care Zackeriah Kissler: Maggie Font Other Clinician: Referring Kimmy Parish: Treating Maximus Hoffert/Extender: Candis Schatz Weeks in Treatment: 0 Wound Status Wound Number: 2 Primary Dehisced Wound Etiology: Wound Location: Right, Medial Lower Leg Wound Status: Open Wounding Event: Surgical Injury Comorbid Hypertension, Peripheral Arterial Disease, Peripheral Venous Date Acquired: 01/03/2023 History: Disease Weeks Of Treatment: 0 Clustered Wound: No Photos Wound Measurements Length: (cm) 7 Width: (cm) 1.7 Depth: (cm) 0.4 Area: (cm) 9.346 Volume: (cm) 3.738 % Reduction in Area: % Reduction in  Volume: Epithelialization: Small (1-33%) Tunneling: No Undermining: No Wound Description Classification: Full Thickness With Exposed Support Structures Wound Margin: Distinct, outline attached Exudate Amount: Medium Exudate Type: Serosanguineous Exudate Color: red, brown Foul Odor After Cleansing: No Slough/Fibrino Yes Wound Bed Granulation Amount: None Present (0%) Exposed Structure Necrotic Amount: Large (67-100%) Fascia Exposed: No Necrotic Quality: Eschar, Adherent Slough Fat Layer (Subcutaneous Tissue) Exposed: Yes Tendon Exposed: No Muscle  Exposed: No Joint Exposed: No Bone Exposed: No Periwound Skin Texture Texture Color No Abnormalities Noted: No No Abnormalities Noted: No Callus: No Atrophie Blanche: No Crepitus: No Cyanosis: No Excoriation: No Ecchymosis: No Induration: No Erythema: No Rash: No Hemosiderin Staining: No Scarring: No Mottled: No Pallor: No Moisture Rubor: No No Abnormalities Noted: No Dry / Scaly: No Temperature / Pain Maceration: Yes Temperature: No Abnormality Tenderness on Palpation: Yes Electronic Signature(s) Signed: 01/17/2023 4:09:44 PM By: Rhae Hammock RN Entered By: Rhae Hammock on 01/11/2023 08:29:19 [name], [name] ([id number]) [id number] Page 8 of 8 -------------------------------------------------------------------------------- Vitals Details Patient Name: Date of Service: [name]. 01/11/2023 8:00 A M Medical Record Number: [id number] Patient Account Number: [id number] Date of Birth/Sex: Treating RN: 1951-08-07 (72 y.o. Erie Noe Primary Care Raul Winterhalter: Maggie Font Other Clinician: Referring Kathe Wirick: Treating Celisse Ciulla/Extender: Candis Schatz Weeks in Treatment: 0 Vital Signs Time Taken: 08:12 Temperature (F): 97.8 Pulse (bpm): 88 Respiratory Rate (breaths/min): 17 Blood Pressure (mmHg): 155/74 Reference Range: 80 - 120 mg /  dl Electronic Signature(s) Signed: 01/17/2023 4:09:44 PM By: Rhae Hammock RN Entered By: Rhae Hammock on 01/11/2023 08:13:12

## 2023-01-18 ENCOUNTER — Encounter (HOSPITAL_BASED_OUTPATIENT_CLINIC_OR_DEPARTMENT_OTHER): Payer: Medicare Other | Admitting: Internal Medicine

## 2023-01-18 DIAGNOSIS — I70238 Atherosclerosis of native arteries of right leg with ulceration of other part of lower right leg: Secondary | ICD-10-CM | POA: Diagnosis not present

## 2023-01-18 DIAGNOSIS — Z8546 Personal history of malignant neoplasm of prostate: Secondary | ICD-10-CM | POA: Diagnosis not present

## 2023-01-18 DIAGNOSIS — Y838 Other surgical procedures as the cause of abnormal reaction of the patient, or of later complication, without mention of misadventure at the time of the procedure: Secondary | ICD-10-CM | POA: Diagnosis not present

## 2023-01-18 DIAGNOSIS — S81801A Unspecified open wound, right lower leg, initial encounter: Secondary | ICD-10-CM | POA: Diagnosis not present

## 2023-01-18 DIAGNOSIS — L080 Pyoderma: Secondary | ICD-10-CM | POA: Diagnosis not present

## 2023-01-18 DIAGNOSIS — T8131XA Disruption of external operation (surgical) wound, not elsewhere classified, initial encounter: Secondary | ICD-10-CM | POA: Diagnosis not present

## 2023-01-18 DIAGNOSIS — L97812 Non-pressure chronic ulcer of other part of right lower leg with fat layer exposed: Secondary | ICD-10-CM | POA: Diagnosis not present

## 2023-01-22 ENCOUNTER — Other Ambulatory Visit: Payer: Self-pay | Admitting: Physician Assistant

## 2023-01-23 DIAGNOSIS — I10 Essential (primary) hypertension: Secondary | ICD-10-CM | POA: Diagnosis not present

## 2023-01-23 DIAGNOSIS — R600 Localized edema: Secondary | ICD-10-CM | POA: Diagnosis not present

## 2023-01-23 DIAGNOSIS — K429 Umbilical hernia without obstruction or gangrene: Secondary | ICD-10-CM | POA: Diagnosis not present

## 2023-01-23 DIAGNOSIS — N1831 Chronic kidney disease, stage 3a: Secondary | ICD-10-CM | POA: Diagnosis not present

## 2023-01-23 DIAGNOSIS — S91301D Unspecified open wound, right foot, subsequent encounter: Secondary | ICD-10-CM | POA: Diagnosis not present

## 2023-01-24 NOTE — Progress Notes (Signed)
[name], [name] ([id number]) [id number] Page 1 of 11 Visit Report for 01/18/2023 Arrival Information Details Patient Name: Date of Service: [name] 01/18/2023 2:00 PM Medical Record Number: [id number] Patient Account Number: [id number] Date of Birth/Sex: Treating RN: [birth year] (72 y.o. M) Primary Care Tamirra Sienkiewicz: Maggie Font Other Clinician: Referring Vani Gunner: Treating Jenille Laszlo/Extender: Wilnette Kales in Treatment: 1 Visit Information History Since Last Visit Added or deleted any medications: No Patient Arrived: Cane Any new allergies or adverse reactions: No Arrival Time: 14:10 Had a fall or experienced change in No Accompanied By: self activities of daily living that may affect Transfer Assistance: None risk of falls: Patient Identification Verified: Yes Signs or symptoms of abuse/neglect since last visito No Secondary Verification Process Completed: Yes Hospitalized since last visit: No Patient Requires Transmission-Based Precautions: No Implantable device outside of the clinic excluding No Patient Has Alerts: No cellular tissue based products placed in the center since last visit: Has Compression in Place as Prescribed: Yes Pain Present Now: Yes Electronic Signature(s) Signed: 01/22/2023 4:38:40 PM By: Erenest Blank Entered By: Erenest Blank on 01/18/2023 14:11:04 -------------------------------------------------------------------------------- Clinic Level of Care Assessment Details Patient Name: Date of Service: [name] 01/18/2023 2:00 PM Medical Record Number: [id number] Patient Account Number: [id number] Date of Birth/Sex: Treating RN: [birth year] (72 y.o. [name] Primary Care Jazira Maloney: Maggie Font Other Clinician: Referring Dequan Kindred: Treating Maryfrances Portugal/Extender: Wilnette Kales in Treatment: 1 Clinic Level of Care Assessment Items TOOL 4  Quantity Score X- 1 0 Use when only an EandM is performed on FOLLOW-UP visit ASSESSMENTS - Nursing Assessment / Reassessment X- 1 10 Reassessment of Co-morbidities (includes updates in patient status) X- 1 5 Reassessment of Adherence to Treatment Plan ASSESSMENTS - Wound and Skin A ssessment / Reassessment X - Simple Wound Assessment / Reassessment - one wound 1 5 '[]'$  - 0 Complex Wound Assessment / Reassessment - multiple wounds '[]'$  - 0 Dermatologic / Skin Assessment (not related to wound area) ASSESSMENTS - Focused Assessment X- 1 5 Circumferential Edema Measurements - multi extremities '[]'$  - 0 Nutritional Assessment / Counseling / Intervention Vincent, Lewis ([id number]) [id number] Page 2 of 11 '[]'$  - 0 Lower Extremity Assessment (monofilament, tuning fork, pulses) '[]'$  - 0 Peripheral Arterial Disease Assessment (using hand held doppler) ASSESSMENTS - Ostomy and/or Continence Assessment and Care '[]'$  - 0 Incontinence Assessment and Management '[]'$  - 0 Ostomy Care Assessment and Management (repouching, etc.) PROCESS - Coordination of Care '[]'$  - 0 Simple Patient / Family Education for ongoing care X- 1 20 Complex (extensive) Patient / Family Education for ongoing care X- 1 10 Staff obtains Programmer, systems, Records, T Results / Process Orders est '[]'$  - 0 Staff telephones HHA, Nursing Homes / Clarify orders / etc '[]'$  - 0 Routine Transfer to another Facility (non-emergent condition) '[]'$  - 0 Routine Hospital Admission (non-emergent condition) '[]'$  - 0 New Admissions / Biomedical engineer / Ordering NPWT Apligraf, etc. , '[]'$  - 0 Emergency Hospital Admission (emergent condition) X- 1 10 Simple Discharge Coordination '[]'$  - 0 Complex (extensive) Discharge Coordination PROCESS - Special Needs '[]'$  - 0 Pediatric / Minor Patient Management '[]'$  - 0 Isolation Patient Management '[]'$  - 0 Hearing / Language / Visual special needs '[]'$  - 0 Assessment of Community  assistance (transportation, D/C planning, etc.) '[]'$  - 0 Additional assistance / Altered mentation '[]'$  - 0 Support Surface(s) Assessment (bed, cushion, seat, etc.) INTERVENTIONS - Wound Cleansing / Measurement X -  Simple Wound Cleansing - one wound 1 5 '[]'$  - 0 Complex Wound Cleansing - multiple wounds X- 1 5 Wound Imaging (photographs - any number of wounds) '[]'$  - 0 Wound Tracing (instead of photographs) X- 1 5 Simple Wound Measurement - one wound '[]'$  - 0 Complex Wound Measurement - multiple wounds INTERVENTIONS - Wound Dressings X - Small Wound Dressing one or multiple wounds 1 10 '[]'$  - 0 Medium Wound Dressing one or multiple wounds '[]'$  - 0 Large Wound Dressing one or multiple wounds X- 1 5 Application of Medications - topical '[]'$  - 0 Application of Medications - injection INTERVENTIONS - Miscellaneous '[]'$  - 0 External ear exam '[]'$  - 0 Specimen Collection (cultures, biopsies, blood, body fluids, etc.) '[]'$  - 0 Specimen(s) / Culture(s) sent or taken to Lab for analysis '[]'$  - 0 Patient Transfer (multiple staff / Civil Service fast streamer / Similar devices) '[]'$  - 0 Simple Staple / Suture removal (25 or less) '[]'$  - 0 Complex Staple / Suture removal (26 or more) '[]'$  - 0 Hypo / Hyperglycemic Management (close monitor of Blood Glucose) RASHI, GIULIANI R ([id number]) [id number] Page 3 of 11 '[]'$  - 0 Ankle / Brachial Index (ABI) - do not check if billed separately X- 1 5 Vital Signs Has the patient been seen at the hospital within the last three years: Yes Total Score: 100 Level Of Care: New/Established - Level 3 Electronic Signature(s) Signed: 01/24/2023 8:39:24 AM By: Rhae Hammock RN Entered By: Rhae Hammock on 01/18/2023 15:15:53 -------------------------------------------------------------------------------- Encounter Discharge Information Details Patient Name: Date of Service: [address] R. 01/18/2023 2:00 PM Medical Record Number:  [id number] Patient Account Number: [id number] Date of Birth/Sex: Treating RN: Dec 03, 1951 (72 y.o. [name], [name] Primary Care Rosebud Koenen: Maggie Font Other Clinician: Referring Marino Rogerson: Treating Alean Kromer/Extender: Wilnette Kales in Treatment: 1 Encounter Discharge Information Items Post Procedure Vitals Discharge Condition: Stable Temperature (F): 98.7 Ambulatory Status: Ambulatory Pulse (bpm): 74 Discharge Destination: Home Respiratory Rate (breaths/min): 17 Transportation: Private Auto Blood Pressure (mmHg): 120/80 Accompanied By: self Schedule Follow-up Appointment: Yes Clinical Summary of Care: Patient Declined Electronic Signature(s) Signed: 01/24/2023 8:39:24 AM By: Rhae Hammock RN Entered By: Rhae Hammock on 01/18/2023 15:16:42 -------------------------------------------------------------------------------- Lower Extremity Assessment Details Patient Name: Date of Service: [address] R. 01/18/2023 2:00 PM Medical Record Number: [id number] Patient Account Number: [id number] Date of Birth/Sex: Treating RN: [birth year] (72 y.o. M) Primary Care Raza Bayless: Maggie Font Other Clinician: Referring Nataley Bahri: Treating Mustafa Potts/Extender: Wilnette Kales in Treatment: 1 Edema Assessment Assessed: [Left: No] [Right: No] Edema: [Left: Ye] [Right: s] Calf Left: Right: Point of Measurement: 42 cm From Medial Instep 45 cm Ankle Left: Right: Point of Measurement: 9 cm From Medial Instep 27 cm Vascular Assessment NEELY, CECENA R ([id number]) [Right:[id number] Page 4 of 11] Pulses: Dorsalis Pedis Palpable: [Right:No Yes] Electronic Signature(s) Signed: 01/22/2023 4:38:40 PM By: Erenest Blank Entered By: Erenest Blank on 01/18/2023 14:31:27 -------------------------------------------------------------------------------- Multi Wound Chart Details Patient Name: Date of  Service: [name] 01/18/2023 2:00 PM Medical Record Number: [id number] Patient Account Number: [id number] Date of Birth/Sex: Treating RN: 12-07-1951 (72 y.o. M) Primary Care Evelise Reine: Maggie Font Other Clinician: Referring Castiel Lauricella: Treating Jayr Lupercio/Extender: Wilnette Kales in Treatment: 1 Vital Signs Height(in): Pulse(bpm): 75 Weight(lbs): Blood Pressure(mmHg): 136/66 Body Mass Index(BMI): Temperature(F): 97.6 Respiratory Rate(breaths/min): 18 [1:Photos:] [N/A:N/A] Right, Circumferential Lower Leg Right, Medial Lower Leg N/A Wound Location: Trauma Surgical Injury N/A Wounding Event:  Arterial Insufficiency Ulcer Dehisced Wound N/A Primary Etiology: Hypertension, Peripheral Arterial Hypertension, Peripheral Arterial N/A Comorbid History: Disease, Peripheral Venous Disease Disease, Peripheral Venous Disease 09/13/2022 01/03/2023 N/A Date Acquired: 1 1 N/A Weeks of Treatment: Open Open N/A Wound Status: No No N/A Wound Recurrence: 5.8x15x0.2 8.5x2.5x1 N/A Measurements L x W x D (cm) 68.33 16.69 N/A A (cm) : rea 13.666 16.69 N/A Volume (cm) : CASTLE, LAMONS ([id number]) [id number] Page 5 of 11 62.80% -78.60% N/A % Reduction in Area: 62.80% -346.50% N/A % Reduction in Volume: Full Thickness With Exposed Support Full Thickness With Exposed Support N/A Classification: Structures Structures Large Medium N/A Exudate A mount: Serosanguineous Serosanguineous N/A Exudate Type: red, brown red, brown N/A Exudate Color: Distinct, outline attached Distinct, outline attached N/A Wound Margin: Small (1-33%) None Present (0%) N/A Granulation Amount: Red, Pink N/A N/A Granulation Quality: Large (67-100%) Large (67-100%) N/A Necrotic Amount: Eschar, Adherent Slough Eschar, Adherent Slough N/A Necrotic Tissue: Fat Layer (Subcutaneous Tissue): Yes Fat Layer (Subcutaneous Tissue): Yes N/A Exposed  Structures: Fascia: No Fascia: No Tendon: No Tendon: No Muscle: No Muscle: No Joint: No Joint: No Bone: No Bone: No None Small (1-33%) N/A Epithelialization: Excoriation: No Excoriation: No N/A Periwound Skin Texture: Induration: No Induration: No Callus: No Callus: No Crepitus: No Crepitus: No Rash: No Rash: No Scarring: No Scarring: No Maceration: No Maceration: Yes N/A Periwound Skin Moisture: Dry/Scaly: No Dry/Scaly: No Erythema: Yes Erythema: Yes N/A Periwound Skin Color: Atrophie Blanche: No Atrophie Blanche: No Cyanosis: No Cyanosis: No Ecchymosis: No Ecchymosis: No Hemosiderin Staining: No Hemosiderin Staining: No Mottled: No Mottled: No Pallor: No Pallor: No Rubor: No Rubor: No Circumferential Circumferential N/A Erythema Location: No Abnormality No Abnormality N/A Temperature: Yes Yes N/A Tenderness on Palpation: Treatment Notes Electronic Signature(s) Signed: 01/19/2023 11:11:08 AM By: Kalman Shan DO Entered By: Kalman Shan on 01/18/2023 14:33:34 -------------------------------------------------------------------------------- Multi-Disciplinary Care Plan Details Patient Name: Date of Service: [address]. 01/18/2023 2:00 PM Medical Record Number: [id number] Patient Account Number: [id number] Date of Birth/Sex: Treating RN: 04-Feb-1951 (72 y.o. [name] Primary Care Jatavia Keltner: Maggie Font Other Clinician: Referring Kesleigh Morson: Treating Trinton Prewitt/Extender: Wilnette Kales in Treatment: 1 Active Inactive Orientation to the Wound Care Program Nursing Diagnoses: Knowledge deficit related to the wound healing center program Goals: Patient/caregiver will verbalize understanding of the [facility] Program Date Initiated: 01/11/2023 Target Resolution Date: 02/01/2023 Goal Status: Active Interventions: Provide education on orientation to the wound center Notes: JEMAR, PAULSEN ([id number]) [id number] Page 6 of 11 Wound/Skin Impairment Nursing Diagnoses: Impaired tissue integrity Knowledge deficit related to ulceration/compromised skin integrity Goals: Patient will have a decrease in wound volume by X% from date: (specify in notes) Date Initiated: 01/11/2023 Target Resolution Date: 02/01/2023 Goal Status: Active Patient/caregiver will verbalize understanding of skin care regimen Date Initiated: 01/11/2023 Target Resolution Date: 02/03/2023 Goal Status: Active Ulcer/skin breakdown will have a volume reduction of 30% by week 4 Date Initiated: 01/11/2023 Target Resolution Date: 02/01/2023 Goal Status: Active Interventions: Assess patient/caregiver ability to obtain necessary supplies Assess patient/caregiver ability to perform ulcer/skin care regimen upon admission and as needed Assess ulceration(s) every visit Notes: Electronic Signature(s) Signed: 01/24/2023 8:39:24 AM By: Rhae Hammock RN Entered By: Rhae Hammock on 01/18/2023 14:48:37 -------------------------------------------------------------------------------- Pain Assessment Details Patient Name: Date of Service: [name] 01/18/2023 2:00 PM Medical Record Number: [id number] Patient Account Number: [id number] Date of Birth/Sex: Treating RN: 1951/07/31 (72 y.o. M) Primary Care Karenna Romanoff: Iona Beard  K Other Clinician: Referring Kajuana Shareef: Treating Myleka Moncure/Extender: Wilnette Kales in Treatment: 1 Active Problems Location of Pain Severity and Description of Pain Patient Has Paino Yes Site Locations Pain Location: Pain in Ulcers Rate the pain. Current Pain Level: 7 Character of Pain Describe the Pain: Other: stinging Pain Management and Medication Current Pain Management: ULYS, FAVIA ([id number]) [id number] Page 7 of 11 Electronic Signature(s) Signed: 01/22/2023 4:38:40 PM By: Erenest Blank Entered By: Erenest Blank on 01/18/2023 14:12:54 -------------------------------------------------------------------------------- Patient/Caregiver Education Details Patient Name: Date of Service: [name] 1/25/2024andnbsp2:00 PM Medical Record Number: [id number] Patient Account Number: [id number] Date of Birth/Gender: Treating RN: 1951-04-20 (72 y.o. [name] Primary Care Physician: Maggie Font Other Clinician: Referring Physician: Treating Physician/Extender: Wilnette Kales in Treatment: 1 Education Assessment Education Provided To: Patient Education Topics Provided Wound/Skin Impairment: Methods: Explain/Verbal Responses: Reinforcements needed, State content correctly Electronic Signature(s) Signed: 01/24/2023 8:39:24 AM By: Rhae Hammock RN Entered By: Rhae Hammock on 01/18/2023 14:48:48 -------------------------------------------------------------------------------- Wound Assessment Details Patient Name: Date of Service: [name] 01/18/2023 2:00 PM Medical Record Number: [id number] Patient Account Number: [id number] Date of Birth/Sex: Treating RN: 1951-08-09 (72 y.o. M) Primary Care Finlay Mills: Maggie Font Other Clinician: Referring Adrion Menz: Treating Jadwiga Faidley/Extender: Patrecia Pour Weeks in Treatment: 1 Wound Status Wound Number: 1 Primary Arterial Insufficiency Ulcer Etiology: Wound Location: Right, Circumferential Lower Leg Wound Status: Open Wounding Event: Trauma Comorbid Hypertension, Peripheral Arterial Disease, Peripheral Venous Date Acquired: 09/13/2022 History: Disease Weeks Of Treatment: 1 Clustered Wound: No Photos VAIBHAV, FOGLEMAN ([id number]) [id number] Page 8 of 11 Wound Measurements Length: (cm) 5.8 Width: (cm) 15 Depth: (cm) 0.2 Area: (cm) 68.33 Volume: (cm) 13.666 % Reduction in Area: 62.8% % Reduction in  Volume: 62.8% Epithelialization: None Tunneling: No Undermining: No Wound Description Classification: Full Thickness With Exposed Support Structures Wound Margin: Distinct, outline attached Exudate Amount: Large Exudate Type: Serosanguineous Exudate Color: red, brown Foul Odor After Cleansing: No Slough/Fibrino Yes Wound Bed Granulation Amount: Small (1-33%) Exposed Structure Granulation Quality: Red, Pink Fascia Exposed: No Necrotic Amount: Large (67-100%) Fat Layer (Subcutaneous Tissue) Exposed: Yes Necrotic Quality: Eschar, Adherent Slough Tendon Exposed: No Muscle Exposed: No Joint Exposed: No Bone Exposed: No Periwound Skin Texture Texture Color No Abnormalities Noted: No No Abnormalities Noted: No Callus: No Atrophie Blanche: No Crepitus: No Cyanosis: No Excoriation: No Ecchymosis: No Induration: No Erythema: Yes Rash: No Erythema Location: Circumferential Scarring: No Hemosiderin Staining: No Mottled: No Moisture Pallor: No No Abnormalities Noted: No Rubor: No Dry / Scaly: No Maceration: No Temperature / Pain Temperature: No Abnormality Tenderness on Palpation: Yes Treatment Notes Wound #1 (Lower Leg) Wound Laterality: Right, Circumferential Cleanser Soap and Water Discharge Instruction: May shower and wash wound with dial antibacterial soap and water prior to dressing change. Wound Cleanser Discharge Instruction: Cleanse the wound with wound cleanser prior to applying a clean dressing using gauze sponges, not tissue or cotton balls. Peri-Wound Care Topical Primary Dressing vashe wet to dry Secondary Dressing ABD Pad, 5x9 Discharge Instruction: Apply over primary dressing as directed. Secured With [employer], 4.5x3.1 (in/yd) CROSBY, ORIORDAN ([id number]) [id number] Page 9 of 11 Discharge Instruction: Secure with Kerlix as directed. 9M Medipore H Soft Cloth Surgical T ape, 4 x 10 (in/yd) Discharge  Instruction: Secure with tape as directed. Stretch Net Size 5, 10 (yds) Compression Wrap Compression Stockings Add-Ons Electronic Signature(s) Signed: 01/22/2023 4:38:40 PM By: Erenest Blank Entered  By: Erenest Blank on 01/18/2023 14:26:09 -------------------------------------------------------------------------------- Wound Assessment Details Patient Name: Date of Service: [name] 01/18/2023 2:00 PM Medical Record Number: [id number] Patient Account Number: [id number] Date of Birth/Sex: Treating RN: 1951-08-11 (72 y.o. M) Primary Care Audriana Aldama: Maggie Font Other Clinician: Referring Linzey Ramser: Treating Monae Topping/Extender: Wilnette Kales in Treatment: 1 Wound Status Wound Number: 2 Primary Dehisced Wound Etiology: Wound Location: Right, Medial Lower Leg Wound Status: Open Wounding Event: Surgical Injury Comorbid Hypertension, Peripheral Arterial Disease, Peripheral Venous Date Acquired: 01/03/2023 History: Disease Weeks Of Treatment: 1 Clustered Wound: No Photos Wound Measurements Length: (cm) 8.5 Width: (cm) 2.5 Depth: (cm) 1 Area: (cm) 16.69 Volume: (cm) 16.69 % Reduction in Area: -78.6% % Reduction in Volume: -346.5% Epithelialization: Small (1-33%) Tunneling: No Undermining: No Wound Description Classification: Full Thickness With Exposed Support Structures Wound Margin: Distinct, outline attached Exudate Amount: Medium Exudate Type: Serosanguineous Exudate Color: red, brown Foul Odor After Cleansing: No Slough/Fibrino Yes Wound Bed Granulation Amount: None Present (0%) Exposed Structure Necrotic Amount: Large (67-100%) Fascia Exposed: No Necrotic Quality: Eschar, Adherent Slough Fat Layer (Subcutaneous Tissue) Exposed: Yes Tendon Exposed: No Muscle Exposed: No ORRIS, PERIN R ([id number]) [id number] Page 10 of 11 Joint Exposed: No Bone Exposed: No Periwound Skin Texture Texture  Color No Abnormalities Noted: No No Abnormalities Noted: No Callus: No Atrophie Blanche: No Crepitus: No Cyanosis: No Excoriation: No Ecchymosis: No Induration: No Erythema: Yes Rash: No Erythema Location: Circumferential Scarring: No Hemosiderin Staining: No Mottled: No Moisture Pallor: No No Abnormalities Noted: No Rubor: No Dry / Scaly: No Maceration: Yes Temperature / Pain Temperature: No Abnormality Tenderness on Palpation: Yes Treatment Notes Wound #2 (Lower Leg) Wound Laterality: Right, Medial Cleanser Soap and Water Discharge Instruction: May shower and wash wound with dial antibacterial soap and water prior to dressing change. Wound Cleanser Discharge Instruction: Cleanse the wound with wound cleanser prior to applying a clean dressing using gauze sponges, not tissue or cotton balls. Peri-Wound Care Topical Primary Dressing vashe wet to dry Secondary Dressing ABD Pad, 5x9 Discharge Instruction: Apply over primary dressing as directed. Secured With [employer], 4.5x3.1 (in/yd) Discharge Instruction: Secure with Kerlix as directed. 43M Medipore H Soft Cloth Surgical T ape, 4 x 10 (in/yd) Discharge Instruction: Secure with tape as directed. Stretch Net Size 5, 10 (yds) Compression Wrap Compression Stockings Add-Ons Electronic Signature(s) Signed: 01/22/2023 4:38:40 PM By: Erenest Blank Entered By: Erenest Blank on 01/18/2023 14:26:31 -------------------------------------------------------------------------------- Vitals Details Patient Name: Date of Service: [name] 01/18/2023 2:00 PM Medical Record Number: [id number] Patient Account Number: [id number] Date of Birth/Sex: Treating RN: [birth year] (72 y.o. M) Primary Care Azaan Leask: Maggie Font Other Clinician: Referring Aasir Daigler: Treating Dru Primeau/Extender: Wilnette Kales in Treatment: 1 Vital Signs RICKI, CLACK ([id number])  [id number] Page 11 of 11 Time Taken: 14:12 Temperature (F): 97.6 Pulse (bpm): 75 Respiratory Rate (breaths/min): 18 Blood Pressure (mmHg): 136/66 Reference Range: 80 - 120 mg / dl Electronic Signature(s) Signed: 01/22/2023 4:38:40 PM By: Erenest Blank Entered By: Erenest Blank on 01/18/2023 14:12:34

## 2023-01-24 NOTE — Progress Notes (Signed)
[name], [name] ([id number]) [id number] Page 1 of 9 Visit Report for 01/18/2023 Chief Complaint Document Details Patient Name: Date of Service: [name] 01/18/2023 2:00 PM Medical Record Number: [id number] Patient Account Number: [id number] Date of Birth/Sex: Treating RN: September 26, 1951 (72 y.o. M) Primary Care Provider: Maggie Lewis Other Clinician: Referring Provider: Treating Provider/Extender: [name] in Treatment: 1 Information Obtained from: Patient Chief Complaint 01/11/2023; arterial right lower extremity wound, surgical dehiscence to the medial right lower extremity Electronic Signature(s) Signed: 01/19/2023 11:11:08 AM By: Vincent Shan DO Entered By: [name] on 01/18/2023 14:33:48 -------------------------------------------------------------------------------- Debridement Details Patient Name: Date of Service: Vincent Eng Lewis. 01/18/2023 2:00 PM Medical Record Number: [id number] Patient Account Number: [id number] Date of Birth/Sex: Treating RN: 06/23/1951 (72 y.o. [name], [name] Primary Care Provider: Maggie Lewis Other Clinician: Referring Provider: Treating Provider/Extender: [name] in Treatment: 1 Debridement Performed for Assessment: Wound #1 Right,Circumferential Lower Leg Performed By: Physician Vincent Shan, DO Debridement Type: Debridement Severity of Tissue Pre Debridement: Fat layer exposed Level of Consciousness (Pre-procedure): Awake and Alert Pre-procedure Verification/Time Out Yes - 14:43 Taken: Start Time: 14:43 Pain Control: Lidocaine T Area Debrided (L x W): otal 5.8 (cm) x 15 (cm) = 87 (cm) Tissue and other material debrided: Viable, Non-Viable, Eschar, Slough, Slough Level: Non-Viable Tissue Debridement Description: Selective/Open Wound Instrument: Blade, Forceps Bleeding: Minimum Hemostasis Achieved:  Pressure End Time: 14:43 Procedural Pain: 0 Post Procedural Pain: 0 Response to Treatment: Procedure was tolerated well Level of Consciousness (Post- Awake and Alert procedure): Post Debridement Measurements of Total Wound Length: (cm) 5.8 Width: (cm) 15 Depth: (cm) 0.2 Volume: (cm) 13.666 Character of Wound/Ulcer Post Debridement: Improved Severity of Tissue Post Debridement: Fat layer exposed [name], [name] ([id number]) [id number] Page 2 of 9 Post Procedure Diagnosis Same as Pre-procedure Electronic Signature(s) Signed: 01/19/2023 11:11:08 AM By: Vincent Shan DO Signed: 01/24/2023 8:39:24 AM By: Vincent Hammock RN Entered By: [name] on 01/18/2023 14:45:40 -------------------------------------------------------------------------------- HPI Details Patient Name: Date of Service: Vincent Eng Lewis. 01/18/2023 2:00 PM Medical Record Number: [id number] Patient Account Number: [id number] Date of Birth/Sex: Treating RN: [birth year] (72 y.o. M) Primary Care Provider: Maggie Lewis Other Clinician: Referring Provider: Treating Provider/Extender: [name] in Treatment: 1 History of Present Illness HPI Description: 01/11/2023 [name] is a 72 year old male with a past medical history of peripheral arterial disease status post femoropopliteal bypass graft on 12/12/2022 and prostate cancer that presents to the clinic with 2 wounds to his right lower extremity. He states that in October 2023 he was wearing a boot that rubbed into his leg creating a wound. Since the wound was not healing he had ABIs completed that showed a right ABI of 0.35 and TBI of 0. He was referred to [facility], [name] who did a femoropopliteal bypass graft on the right on 12/12/2022. He reports chronic pain to the wound site. He reports having staples removed to the medial right leg 1 week ago and the wound site has dehisced. He  has not been dressing either of the wound beds. He currently denies systemic signs of infection. 1/25; patient presents for follow-up. He followed up with vein and vascular yesterday. It was noted that he had excellent biphasic flow in the posterior tibial of the right lower extremity. He has been using Hydrofera Blue and Santyl to the wound beds. He has chronic pain to the  wound sites. Electronic Signature(s) Signed: 01/19/2023 11:11:08 AM By: Vincent Shan DO Entered By: [name] on 01/18/2023 14:58:50 -------------------------------------------------------------------------------- Physical Exam Details Patient Name: Date of Service: Vincent Eng Lewis. 01/18/2023 2:00 PM Medical Record Number: [id number] Patient Account Number: [id number] Date of Birth/Sex: Treating RN: 04/06/1951 (72 y.o. M) Primary Care Provider: Maggie Lewis Other Clinician: Referring Provider: Treating Provider/Extender: [name] in Treatment: 1 Constitutional respirations regular, non-labored and within target range for patient.. Cardiovascular 2+ dorsalis pedis/posterior tibialis pulses. Psychiatric pleasant and cooperative. Notes Right lower extremity: T the anterior distal aspect there is an open wound with nonviable surface throughout. T the periwound there is skin breakdown. T the o o o medial right leg there is an incision site with a large dehisced wound with non viable tissue and scant granulation tissue to the superior portion. Mild odor to the wound beds, no purulent drainage. 2+ pitting edema to the knee [name], [name] ([id number]) [phone].pdf Page 3 of 9 Electronic Signature(s) Signed: 01/19/2023 11:11:08 AM By: Vincent Shan DO Entered By: [name] on 01/18/2023 15:00:45 -------------------------------------------------------------------------------- Physician Orders Details Patient Name: Date of Service: Vincent Eng Lewis. 01/18/2023 2:00 PM Medical Record Number: [id number] Patient Account Number: [id number] Date of Birth/Sex: Treating RN: Mar 27, 1951 (72 y.o. [name] Primary Care Provider: Maggie Lewis Other Clinician: Referring Provider: Treating Provider/Extender: [name] in Treatment: 1 Verbal / Phone Orders: No Diagnosis Coding ICD-10 Coding Code Description 4798114035 Atherosclerosis of native arteries of right leg with ulceration of other part of lower leg S81.801A Unspecified open wound, right lower leg, initial encounter T81.31XA Disruption of external operation (surgical) wound, not elsewhere classified, initial encounter Follow-up Appointments ppointment in 1 week. - w/ Dr. Heber [place] next Thursday 01/25/23 @ 9:30 Rm # 9 w/ Allayne Butcher Return A ppointment in 2 weeks. - w/ Dr. Heber [place] 02/01/23 @ 12:30 Rm # 9 w/ lauran Return A Other: - Pick up your antibiotics from your pharmacy as soon as possible. Anesthetic (In clinic) Topical Lidocaine 5% applied to wound bed Bathing/ Shower/ Hygiene May shower with protection but do not get wound dressing(s) wet. Protect dressing(s) with water repellant cover (for example, large plastic bag) or a cast cover and may then take shower. - May wash wounds with dial antibacterial soap in the shower if you immediately dress wound after shower. Edema Control - Lymphedema / SCD / Other Elevate legs to the level of the heart or above for 30 minutes daily and/or when sitting for 3-4 times a day throughout the day. Avoid standing for long periods of time. Wound Treatment Wound #1 - Lower Leg Wound Laterality: Right, Circumferential Cleanser: Soap and Water 1 x Per MOQ/94 Days Discharge Instructions: May shower and wash wound with dial antibacterial soap and water prior to dressing change. Cleanser: Wound Cleanser (Generic) 1 x Per Day/15 Days Discharge Instructions: Cleanse the wound with wound cleanser prior to  applying a clean dressing using gauze sponges, not tissue or cotton balls. Prim Dressing: vashe wet to dry ary 1 x Per TML/46 Days Secondary Dressing: ABD Pad, 5x9 (Generic) 1 x Per Day/15 Days Discharge Instructions: Apply over primary dressing as directed. Secured With: [employer], 4.5x3.1 (in/yd) (Generic) 1 x Per Day/15 Days Discharge Instructions: Secure with Kerlix as directed. Secured With: 59M Medipore H Soft Cloth Surgical T ape, 4 x 10 (in/yd) (Generic) 1 x Per Day/15 Days Discharge Instructions: Secure with tape as directed.  Secured With: [employer] Size 5, 10 (yds) (Generic) 1 x Per Day/15 Days Wound #2 - Lower Leg Wound Laterality: Right, Medial Cleanser: Soap and Water 1 x Per Day/15 Days Discharge Instructions: May shower and wash wound with dial antibacterial soap and water prior to dressing change. Cleanser: Wound Cleanser (Generic) 1 x Per Day/15 Days Discharge Instructions: Cleanse the wound with wound cleanser prior to applying a clean dressing using gauze sponges, not tissue or cotton balls. Prim Dressing: vashe wet to dry ary 1 x Per Day/15 Days [name], [name] ([id number]) [phone].pdf Page 4 of 9 Secondary Dressing: ABD Pad, 5x9 (Generic) 1 x Per Day/15 Days Discharge Instructions: Apply over primary dressing as directed. Secured With: [employer], 4.5x3.1 (in/yd) (Generic) 1 x Per Day/15 Days Discharge Instructions: Secure with Kerlix as directed. Secured With: 49M Medipore H Soft Cloth Surgical T ape, 4 x 10 (in/yd) (Generic) 1 x Per Day/15 Days Discharge Instructions: Secure with tape as directed. Secured With: Stretch Net Size 5, 10 (yds) (Generic) 1 x Per Day/15 Days Laboratory naerobe culture (MICRO) - PCR right medial leg wound Bacteria identified in Unspecified specimen by A LOINC Code: 154-0 Convenience Name: Anaerobic culture Patient Medications llergies: Shellfish Containing Products A Notifications  Medication Indication Start End 01/18/2023 ciprofloxacin HCl DOSE 1 - oral 750 mg tablet - 1 tablet oral twice a day x 10 days Electronic Signature(s) Signed: 01/18/2023 3:08:52 PM By: Vincent Shan DO Entered By: [name] on 01/18/2023 15:08:51 -------------------------------------------------------------------------------- Problem List Details Patient Name: Date of Service: Vincent Eng Lewis. 01/18/2023 2:00 PM Medical Record Number: [id number] Patient Account Number: [id number] Date of Birth/Sex: Treating RN: [birth year] (72 y.o. M) Primary Care Provider: Maggie Lewis Other Clinician: Referring Provider: Treating Provider/Extender: [name] in Treatment: 1 Active Problems ICD-10 Encounter Code Description Active Date MDM Diagnosis I70.238 Atherosclerosis of native arteries of right leg with ulceration of other part of 01/11/2023 No Yes lower leg S81.801A Unspecified open wound, right lower leg, initial encounter 01/11/2023 No Yes T81.31XA Disruption of external operation (surgical) wound, not elsewhere classified, 01/11/2023 No Yes initial encounter Inactive Problems Resolved Problems [name], [name] ([id number]) [id number] Page 5 of 9 Electronic Signature(s) Signed: 01/19/2023 11:11:08 AM By: Vincent Shan DO Entered By: [name] on 01/18/2023 14:33:23 -------------------------------------------------------------------------------- Progress Note Details Patient Name: Date of Service: Vincent Eng Lewis. 01/18/2023 2:00 PM Medical Record Number: [id number] Patient Account Number: [id number] Date of Birth/Sex: Treating RN: 1951/12/16 (72 y.o. M) Primary Care Provider: Maggie Lewis Other Clinician: Referring Provider: Treating Provider/Extender: [name] in Treatment: 1 Subjective Chief Complaint Information obtained from Patient 01/11/2023; arterial  right lower extremity wound, surgical dehiscence to the medial right lower extremity History of Present Illness (HPI) 01/11/2023 [name] is a 72 year old male with a past medical history of peripheral arterial disease status post femoropopliteal bypass graft on 12/12/2022 and prostate cancer that presents to the clinic with 2 wounds to his right lower extremity. He states that in October 2023 he was wearing a boot that rubbed into his leg creating a wound. Since the wound was not healing he had ABIs completed that showed a right ABI of 0.35 and TBI of 0. He was referred to [facility], [name] who did a femoropopliteal bypass graft on the right on 12/12/2022. He reports chronic pain to the wound site. He reports having staples removed to the medial right leg 1 week ago and  the wound site has dehisced. He has not been dressing either of the wound beds. He currently denies systemic signs of infection. 1/25; patient presents for follow-up. He followed up with vein and vascular yesterday. It was noted that he had excellent biphasic flow in the posterior tibial of the right lower extremity. He has been using Hydrofera Blue and Santyl to the wound beds. He has chronic pain to the wound sites. Patient History Information obtained from Patient, Chart. Family History Unknown History. Social History Former smoker, Marital Status - Single, Alcohol Use - Never, Drug Use - No History, Caffeine Use - Rarely. Medical History Cardiovascular Patient has history of Hypertension, Peripheral Arterial Disease, Peripheral Venous Disease Hospitalization/Surgery History - femoral-tibial bypass graft right 12/12/22. - radical prostatectomy 2011. - tonsillectomy age 34. Medical A Surgical History Notes nd Cardiovascular Hyperlipidemia Gastrointestinal GERD Oncologic prostate ca Objective Constitutional respirations regular, non-labored and within target range for patient.. Vitals Time Taken: 2:12 PM,  Temperature: 97.6 F, Pulse: 75 bpm, Respiratory Rate: 18 breaths/min, Blood Pressure: 136/66 mmHg. Cardiovascular 2+ dorsalis pedis/posterior tibialis pulses. [name], [name] ([id number]) [id number] Page 6 of 9 Psychiatric pleasant and cooperative. General Notes: Right lower extremity: T the anterior distal aspect there is an open wound with nonviable surface throughout. T the periwound there is skin o o breakdown. T the medial right leg there is an incision site with a large dehisced wound with non viable tissue and scant granulation tissue to the superior o portion. Mild odor to the wound beds, no purulent drainage. 2+ pitting edema to the knee Integumentary (Hair, Skin) Wound #1 status is Open. Original cause of wound was Trauma. The date acquired was: 09/13/2022. The wound has been in treatment 1 weeks. The wound is located on the Right,Circumferential Lower Leg. The wound measures 5.8cm length x 15cm width x 0.2cm depth; 68.33cm^2 area and 13.666cm^3 volume. There is Fat Layer (Subcutaneous Tissue) exposed. There is no tunneling or undermining noted. There is a large amount of serosanguineous drainage noted. The wound margin is distinct with the outline attached to the wound base. There is small (1-33%) red, pink granulation within the wound bed. There is a large (67- 100%) amount of necrotic tissue within the wound bed including Eschar and Adherent Slough. The periwound skin appearance exhibited: Erythema. The periwound skin appearance did not exhibit: Callus, Crepitus, Excoriation, Induration, Rash, Scarring, Dry/Scaly, Maceration, Atrophie Blanche, Cyanosis, Ecchymosis, Hemosiderin Staining, Mottled, Pallor, Rubor. The surrounding wound skin color is noted with erythema which is circumferential. Periwound temperature was noted as No Abnormality. The periwound has tenderness on palpation. Wound #2 status is Open. Original cause of wound was Surgical  Injury. The date acquired was: 01/03/2023. The wound has been in treatment 1 weeks. The wound is located on the Right,Medial Lower Leg. The wound measures 8.5cm length x 2.5cm width x 1cm depth; 16.69cm^2 area and 16.69cm^3 volume. There is Fat Layer (Subcutaneous Tissue) exposed. There is no tunneling or undermining noted. There is a medium amount of serosanguineous drainage noted. The wound margin is distinct with the outline attached to the wound base. There is no granulation within the wound bed. There is a large (67-100%) amount of necrotic tissue within the wound bed including Eschar and Adherent Slough. The periwound skin appearance exhibited: Maceration, Erythema. The periwound skin appearance did not exhibit: Callus, Crepitus, Excoriation, Induration, Rash, Scarring, Dry/Scaly, Atrophie Blanche, Cyanosis, Ecchymosis, Hemosiderin Staining, Mottled, Pallor, Rubor. The surrounding wound skin color is noted with erythema which is circumferential. Periwound temperature was  noted as No Abnormality. The periwound has tenderness on palpation. 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 He was noted to have excellent biphasic flow in the posterior tibial at the ankle to the right lower extremity. Patient has uncontrolled edema to the legs bilaterally. He states he was started on a diuretic 1 week ago. I recommended he follow-up with his PCP to discuss potentially increasing the diuretic as there has been little improvement in edema since last clinic visit. He also reports he has not urinating very frequently. The incision site to the right lower extremity has increased in size. This is a fairly large wound with  nonviable tissue throughout. This wound bed apperas infected. I recommended a PCR culture as he would do well with [facility] antibiotic ointment.Also will start on oral antibiotics due to wound bed infection. I debrided nonviable tissue to the distal anterior leg wound. This also appears to be riddled with bioburden. There is a mild odor on exam. I recommended Vashe wet-to-dry dressings under Tubigrip daily to the right lower extremity wounds. Follow up in one week. Procedures Wound #1 Pre-procedure diagnosis of Wound #1 is an Arterial Insufficiency Ulcer located on the Right,Circumferential Lower Leg .Severity of Tissue Pre Debridement is: Fat layer exposed. There was a Selective/Open Wound Non-Viable Tissue Debridement with a total area of 87 sq cm performed by Vincent Shan, DO. With the following instrument(s): Blade, and Forceps to remove Viable and Non-Viable tissue/material. Material removed includes Eschar and Slough and after achieving pain control using Lidocaine. No specimens were taken. A time out was conducted at 14:43, prior to the start of the procedure. A Minimum amount of bleeding was controlled with Pressure. The procedure was tolerated well with a pain level of 0 throughout and a pain level of 0 following the procedure. Post Debridement Measurements: 5.8cm length x 15cm width x 0.2cm depth; 13.666cm^3 volume. Character of Wound/Ulcer Post Debridement is improved. Severity of Tissue Post Debridement is: Fat layer exposed. Post procedure Diagnosis Wound #1: Same as Pre-Procedure Plan Follow-up Appointments: Return Appointment in 1 week. - w/ Dr. Heber [place] next Thursday 01/25/23 @ 9:30 Rm # 9 w/ Allayne Butcher Return Appointment in 2 weeks. - w/ Dr. Heber [place] 02/01/23 @ 12:30 Rm # 9 w/ lauran Other: - Pick up your antibiotics from your pharmacy as soon as possible. Anesthetic: (In clinic) Topical Lidocaine 5% applied to wound bed Bathing/ Shower/ Hygiene: May shower with protection but do  not get wound dressing(s) wet. Protect dressing(s) with water repellant cover (for example, large plastic bag) or a cast cover and may then take shower. - May wash wounds with dial antibacterial soap in the shower if you immediately dress wound after shower. Edema Control - Lymphedema / SCD / Other: Elevate legs to the level of the heart or above for 30 minutes daily and/or when sitting for 3-4 times a day throughout the day. Avoid standing for long periods of time. Laboratory ordered were: Anaerobic culture - PCR right medial leg wound The following medication(s) was prescribed: ciprofloxacin HCl oral 750 mg tablet 1 1 tablet oral twice a day x 10 days starting 01/18/2023 [name], [name] ([id number]) [phone].pdf Page 7 of 9 WOUND #1: - Lower Leg Wound Laterality: Right, Circumferential Cleanser: Soap and Water 1 x Per Day/15  Days Discharge Instructions: May shower and wash wound with dial antibacterial soap and water prior to dressing change. Cleanser: Wound Cleanser (Generic) 1 x Per Day/15 Days Discharge Instructions: Cleanse the wound with wound cleanser prior to applying a clean dressing using gauze sponges, not tissue or cotton balls. Prim Dressing: vashe wet to dry 1 x Per Day/15 Days ary Secondary Dressing: ABD Pad, 5x9 (Generic) 1 x Per Day/15 Days Discharge Instructions: Apply over primary dressing as directed. Secured With: [employer], 4.5x3.1 (in/yd) (Generic) 1 x Per Day/15 Days Discharge Instructions: Secure with Kerlix as directed. Secured With: 16M Medipore H Soft Cloth Surgical T ape, 4 x 10 (in/yd) (Generic) 1 x Per Day/15 Days Discharge Instructions: Secure with tape as directed. Secured With: [employer] Size 5, 10 (yds) (Generic) 1 x Per Day/15 Days WOUND #2: - Lower Leg Wound Laterality: Right, Medial Cleanser: Soap and Water 1 x Per Day/15 Days Discharge Instructions: May shower and wash wound with dial antibacterial soap and  water prior to dressing change. Cleanser: Wound Cleanser (Generic) 1 x Per Day/15 Days Discharge Instructions: Cleanse the wound with wound cleanser prior to applying a clean dressing using gauze sponges, not tissue or cotton balls. Prim Dressing: vashe wet to dry 1 x Per Day/15 Days ary Secondary Dressing: ABD Pad, 5x9 (Generic) 1 x Per Day/15 Days Discharge Instructions: Apply over primary dressing as directed. Secured With: [employer], 4.5x3.1 (in/yd) (Generic) 1 x Per Day/15 Days Discharge Instructions: Secure with Kerlix as directed. Secured With: 16M Medipore H Soft Cloth Surgical T ape, 4 x 10 (in/yd) (Generic) 1 x Per Day/15 Days Discharge Instructions: Secure with tape as directed. Secured With: St[employer] Net Size 5, 10 (yds) (Generic) 1 x Per Day/15 Days 1. In office sharp debridement 2. Vashe wet-to-dry dressings under Tubigrip 3. PCR culture 4. Follow-up in 1 week 5. Ciprofloxacin Electronic Signature(s) Signed: 01/19/2023 11:11:08 AM By: Vincent Shan DO Entered By: [name] on 01/18/2023 15:09:05 -------------------------------------------------------------------------------- HxROS Details Patient Name: Date of Service: Vincent Eng Lewis. 01/18/2023 2:00 PM Medical Record Number: [id number] Patient Account Number: [id number] Date of Birth/Sex: Treating RN: 01/03/1951 (72 y.o. M) Primary Care Provider: Maggie Lewis Other Clinician: Referring Provider: Treating Provider/Extender: [name] in Treatment: 1 Information Obtained From Patient Chart Cardiovascular Medical History: Positive for: Hypertension; Peripheral Arterial Disease; Peripheral Venous Disease Past Medical History Notes: Hyperlipidemia Gastrointestinal Medical History: Past Medical History Notes: GERD Oncologic Medical History: Past Medical History Notes: prostate ca Immunizations Pneumococcal VaccineKIARA, [name] ([id number])  [id number] Page 8 of 9 Received Pneumococcal Vaccination: Yes Received Pneumococcal Vaccination On or After 60th Birthday: Yes Implantable Devices None Hospitalization / Surgery History Type of Hospitalization/Surgery femoral-tibial bypass graft right 12/12/22 radical prostatectomy 2011 tonsillectomy age 59 Family and Social History Unknown History: Yes; Former smoker; Marital Status - Single; Alcohol Use: Never; Drug Use: No History; Caffeine Use: Rarely; Financial Concerns: No; Food, Clothing or Shelter Needs: No; Support System Lacking: No; Transportation Concerns: No Electronic Signature(s) Signed: 01/19/2023 11:11:08 AM By: Vincent Shan DO Entered By: [name] on 01/18/2023 14:58:55 -------------------------------------------------------------------------------- SuperBill Details Patient Name: Date of Service: [name]. 01/18/2023 Medical Record Number: [id number] Patient Account Number: [id number] Date of Birth/Sex: Treating RN: [birth year] (72 y.o. M) Primary Care Provider: Maggie Lewis Other Clinician: Referring Provider: Treating Provider/Extender: [name] in Treatment: 1 Diagnosis Coding ICD-10 Codes Code Description [phone] Atherosclerosis of native arteries of  right leg with ulceration of other part of lower leg S81.801A Unspecified open wound, right lower leg, initial encounter T81.31XA Disruption of external operation (surgical) wound, not elsewhere classified, initial encounter Facility Procedures : CPT4 Code: [id number] Description: 99213 - WOUND CARE VISIT-LEV 3 EST PT Modifier: Quantity: 1 : CPT4 Code: [id number] Description: 97282 - DEBRIDE WOUND 1ST 20 SQ CM OR < ICD-10 Diagnosis Description S81.801A Unspecified open wound, right lower leg, initial encounter Modifier: Quantity: 1 : CPT4 Code: [id number] Description: 37943 - DEBRIDE WOUND EA ADDL 20 SQ CM ICD-10 Diagnosis  Description S81.801A Unspecified open wound, right lower leg, initial encounter Modifier: Quantity: 4 Physician Procedures : CPT4 Code Description Modifier [id number] 92957 - WC PHYS LEVEL 4 - EST PT ICD-10 Diagnosis Description S81.801A Unspecified open wound, right lower leg, initial encounter T81.31XA Disruption of external operation (surgical) wound, not elsewhere  classified, initial encounter 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

## 2023-01-25 ENCOUNTER — Encounter (HOSPITAL_BASED_OUTPATIENT_CLINIC_OR_DEPARTMENT_OTHER): Payer: Medicare Other | Attending: Internal Medicine | Admitting: Internal Medicine

## 2023-01-25 DIAGNOSIS — S81801A Unspecified open wound, right lower leg, initial encounter: Secondary | ICD-10-CM

## 2023-01-25 DIAGNOSIS — I87311 Chronic venous hypertension (idiopathic) with ulcer of right lower extremity: Secondary | ICD-10-CM | POA: Insufficient documentation

## 2023-01-25 DIAGNOSIS — L97815 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 without evidence of necrosis: Secondary | ICD-10-CM | POA: Insufficient documentation

## 2023-01-25 DIAGNOSIS — I70238 Atherosclerosis of native arteries of right leg with ulceration of other part of lower right leg: Secondary | ICD-10-CM | POA: Insufficient documentation

## 2023-01-26 NOTE — Progress Notes (Signed)
BRADEY, LUZIER ([id number]) [id number] Page 1 of 8 Visit Report for 01/25/2023 Chief Complaint Document Details Patient Name: Date of Service: [name] 01/25/2023 9:30 A M Medical Record Number: [id number] Patient Account Number: [id number] Date of Birth/Sex: Treating RN: [birth year] (72 y.o. M) Primary Care Provider: Maggie Font Other Clinician: Referring Provider: Treating Provider/Extender: Wilnette Kales in Treatment: 2 Information Obtained from: Patient Chief Complaint 01/11/2023; arterial right lower extremity wound, surgical dehiscence to the medial right lower extremity Electronic Signature(s) Signed: 01/25/2023 1:45:23 PM By: Kalman Shan DO Entered By: Kalman Shan on 01/25/2023 10:02:53 -------------------------------------------------------------------------------- Debridement Details Patient Name: Date of Service: Vincent Eng Lewis. 01/25/2023 9:30 A M Medical Record Number: [id number] Patient Account Number: [id number] Date of Birth/Sex: Treating RN: [birth year] (72 y.o. [name], [name] Primary Care Provider: Maggie Font Other Clinician: Referring Provider: Treating Provider/Extender: Wilnette Kales in Treatment: 2 Debridement Performed for Assessment: Wound #1 Right,Circumferential Lower Leg Performed By: Physician Kalman Shan, DO Debridement Type: Debridement Severity of Tissue Pre Debridement: Fat layer exposed Level of Consciousness (Pre-procedure): Awake and Alert Pre-procedure Verification/Time Out Yes - 09:50 Taken: Start Time: 09:50 Pain Control: Lidocaine T Area Debrided (L x W): otal 4.3 (cm) x 13 (cm) = 55.9 (cm) Tissue and other material debrided: Viable, Non-Viable, Slough, Slough Level: Non-Viable Tissue Debridement Description: Selective/Open Wound Instrument: Curette Bleeding: Minimum Hemostasis Achieved: Pressure End Time:  09:50 Procedural Pain: 0 Post Procedural Pain: 0 Response to Treatment: Procedure was tolerated well Level of Consciousness (Post- Awake and Alert procedure): Post Debridement Measurements of Total Wound Length: (cm) 4.6 Width: (cm) 13 Depth: (cm) 0.2 Volume: (cm) 9.393 Character of Wound/Ulcer Post Debridement: Improved Severity of Tissue Post Debridement: Fat layer exposed [name], [name] ([id number]) [phone].pdf Page 2 of 8 Post Procedure Diagnosis Same as Pre-procedure Electronic Signature(s) Signed: 01/25/2023 1:45:23 PM By: Kalman Shan DO Signed: 01/26/2023 8:21:34 AM By: Rhae Hammock RN Entered By: Rhae Hammock on 01/25/2023 09:54:32 -------------------------------------------------------------------------------- HPI Details Patient Name: Date of Service: Vincent Eng Lewis. 01/25/2023 9:30 A M Medical Record Number: [id number] Patient Account Number: [id number] Date of Birth/Sex: Treating RN: 1951/09/24 (72 y.o. M) Primary Care Provider: Maggie Font Other Clinician: Referring Provider: Treating Provider/Extender: Wilnette Kales in Treatment: 2 History of Present Illness HPI Description: 01/11/2023 Mr. Jaxsyn Azam is a 72 year old male with a past medical history of peripheral arterial disease status post femoropopliteal bypass graft on 12/12/2022 and prostate cancer that presents to the clinic with 2 wounds to his right lower extremity. He states that in October 2023 he was wearing a boot that rubbed into his leg creating a wound. Since the wound was not healing he had ABIs completed that showed a right ABI of 0.35 and TBI of 0. He was [facility], Dr. Donnetta Hutching who did a femoropopliteal bypass graft on the right on 12/12/2022. He reports chronic pain to the wound site. He reports having staples removed to the medial right leg 1 week ago and the wound site has dehisced. He has not been dressing  either of the wound beds. He currently denies systemic signs of infection. 1/25; patient presents for follow-up. He followed up with vein and vascular yesterday. It was noted that he had excellent biphasic flow in the posterior tibial of the right lower extremity. He has been using Hydrofera Blue and Santyl to the wound beds. He has chronic pain to  the wound sites. 2/1; patient presents for follow-up. Patient had a PCR culture done at last clinic visit that showed E. coli, coagulase-negative staph, Candida parapsilosis and actinotignum schaalii. He is currently on ciprofloxacin that covers the E. coli. I will switch him over to Augmentin. We ordered [facility] antibiotic ointment and patient has paid for this and states it is coming in the mail tomorrow. For now he has been using Vashe wet-to-dry dressings. Electronic Signature(s) Signed: 01/25/2023 1:45:23 PM By: Kalman Shan DO Entered By: Kalman Shan on 01/25/2023 10:04:41 -------------------------------------------------------------------------------- Physical Exam Details Patient Name: Date of Service: Vincent Eng Lewis. 01/25/2023 9:30 A M Medical Record Number: [id number] Patient Account Number: [id number] Date of Birth/Sex: Treating RN: 09-22-1951 (72 y.o. M) Primary Care Provider: Maggie Font Other Clinician: Referring Provider: Treating Provider/Extender: Wilnette Kales in Treatment: 2 Constitutional respirations regular, non-labored and within target range for patient.Marland Kitchen Psychiatric pleasant and cooperative. Notes Right lower extremity: T the anterior distal aspect there is an open wound with nonviable surface and granulation tissue. T the medial right leg there is an o o incision site with a large dehisced wound with non viable tissue throughout. no purulent drainage. 2+ pitting edema to the knee [name], [name] ([id number]) [phone].pdf Page 3 of 8 Electronic  Signature(s) Signed: 01/25/2023 1:45:23 PM By: Kalman Shan DO Entered By: Kalman Shan on 01/25/2023 10:05:35 -------------------------------------------------------------------------------- Physician Orders Details Patient Name: Date of Service: Vincent Eng Lewis. 01/25/2023 9:30 A M Medical Record Number: [id number] Patient Account Number: [id number] Date of Birth/Sex: Treating RN: Sep 30, 1951 (72 y.o. Erie Noe Primary Care Provider: Maggie Font Other Clinician: Referring Provider: Treating Provider/Extender: Wilnette Kales in Treatment: 2 Verbal / Phone Orders: No Diagnosis Coding Follow-up Appointments ppointment in 1 week. - w/ Dr. Heber [place] next Thursday 02/01/23 @ 12:30 Rm # 9 w/ Allayne Butcher Return A ppointment in 2 weeks. - w/ Dr. Heber [place] and Allayne Butcher Rm # 9 (go ahead and schedule) Return A Other: - Pick up your antibiotics from your pharmacy as soon as possible. Start applying the [place] once you get it. Bring it to every appointment. Anesthetic (In clinic) Topical Lidocaine 5% applied to wound bed Bathing/ Shower/ Hygiene May shower with protection but do not get wound dressing(s) wet. Protect dressing(s) with water repellant cover (for example, large plastic bag) or a cast cover and may then take shower. - May wash wounds with dial antibacterial soap in the shower if you immediately dress wound after shower. Edema Control - Lymphedema / SCD / Other Elevate legs to the level of the heart or above for 30 minutes daily and/or when sitting for 3-4 times a day throughout the day. Avoid standing for long periods of time. Wound Treatment Wound #1 - Lower Leg Wound Laterality: Right, Circumferential Cleanser: Soap and Water 1 x Per HCW/23 Days Discharge Instructions: May shower and wash wound with dial antibacterial soap and water prior to dressing change. Cleanser: Wound Cleanser (Generic) 1 x Per Day/15 Days Discharge Instructions:  Cleanse the wound with wound cleanser prior to applying a clean dressing using gauze sponges, not tissue or cotton balls. Prim Dressing: vashe wet to dry ary 1 x Per JSE/83 Days Secondary Dressing: ABD Pad, 5x9 (Generic) 1 x Per Day/15 Days Discharge Instructions: Apply over primary dressing as directed. Secured With: [employer], 4.5x3.1 (in/yd) (Generic) 1 x Per Day/15 Days Discharge Instructions: Secure with Kerlix as directed. Secured With: 69M Medipore  H Soft Cloth Surgical T ape, 4 x 10 (in/yd) (Generic) 1 x Per Day/15 Days Discharge Instructions: Secure with tape as directed. Secured With: [employer] Size 5, 10 (yds) (Generic) 1 x Per Day/15 Days Wound #2 - Lower Leg Wound Laterality: Right, Medial Cleanser: Soap and Water 1 x Per Day/15 Days Discharge Instructions: May shower and wash wound with dial antibacterial soap and water prior to dressing change. Cleanser: Wound Cleanser (Generic) 1 x Per Day/15 Days Discharge Instructions: Cleanse the wound with wound cleanser prior to applying a clean dressing using gauze sponges, not tissue or cotton balls. Prim Dressing: vashe wet to dry ary 1 x Per FHQ/19 Days Secondary Dressing: ABD Pad, 5x9 (Generic) 1 x Per Day/15 Days Discharge Instructions: Apply over primary dressing as directed. Secured With: [employer], 4.5x3.1 (in/yd) (Generic) 1 x Per Day/15 Days [name], [name] ([id number]) [phone].pdf Page 4 of 8 Discharge Instructions: Secure with Kerlix as directed. Secured With: 12M Medipore H Soft Cloth Surgical T ape, 4 x 10 (in/yd) (Generic) 1 x Per Day/15 Days Discharge Instructions: Secure with tape as directed. Secured With: [employer] Size 5, 10 (yds) (Generic) 1 x Per Day/15 Days Compression Wrap: tubi grip 1 x Per Day/15 Days Patient Medications llergies: Shellfish Containing Products A Notifications Medication Indication Start End 01/25/2023 amoxicillin-pot clavulanate DOSE 1  - oral 875 mg-125 mg tablet - 1 tablet oral twice a day x 7 days Electronic Signature(s) Signed: 01/25/2023 10:17:12 AM By: Kalman Shan DO Entered By: Kalman Shan on 01/25/2023 10:17:11 -------------------------------------------------------------------------------- Problem List Details Patient Name: Date of Service: Vincent Eng Lewis. 01/25/2023 9:30 A M Medical Record Number: [id number] Patient Account Number: [id number] Date of Birth/Sex: Treating RN: 01/17/1951 (72 y.o. M) Primary Care Provider: Maggie Font Other Clinician: Referring Provider: Treating Provider/Extender: Wilnette Kales in Treatment: 2 Active Problems ICD-10 Encounter Code Description Active Date MDM Diagnosis I70.238 Atherosclerosis of native arteries of right leg with ulceration of other part of 01/11/2023 No Yes lower leg S81.801A Unspecified open wound, right lower leg, initial encounter 01/11/2023 No Yes T81.31XA Disruption of external operation (surgical) wound, not elsewhere classified, 01/11/2023 No Yes initial encounter Inactive Problems Resolved Problems Electronic Signature(s) Signed: 01/25/2023 1:45:23 PM By: Kalman Shan DO Entered By: Kalman Shan on 01/25/2023 10:02:36 [name] ([id number]) [id number] Page 5 of 8 -------------------------------------------------------------------------------- Progress Note Details Patient Name: Date of Service: [name] 01/25/2023 9:30 A M Medical Record Number: [id number] Patient Account Number: [id number] Date of Birth/Sex: Treating RN: 1951/01/25 (73 y.o. M) Primary Care Provider: Maggie Font Other Clinician: Referring Provider: Treating Provider/Extender: Wilnette Kales in Treatment: 2 Subjective Chief Complaint Information obtained from Patient 01/11/2023; arterial right lower extremity wound, surgical dehiscence to the medial right  lower extremity History of Present Illness (HPI) 01/11/2023 [name] is a 72 year old male with a past medical history of peripheral arterial disease status post femoropopliteal bypass graft on 12/12/2022 and prostate cancer that presents to the clinic with 2 wounds to his right lower extremity. He states that in October 2023 he was wearing a boot that rubbed into his leg creating a wound. Since the wound was not healing he had ABIs completed that showed a right ABI of 0.35 and TBI of 0. He was referred [facility], Dr. Donnetta Hutching who did a femoropopliteal bypass graft on the right on 12/12/2022. He reports chronic pain to the wound site. He reports having  staples removed to the medial right leg 1 week ago and the wound site has dehisced. He has not been dressing either of the wound beds. He currently denies systemic signs of infection. 1/25; patient presents for follow-up. He followed up with vein and vascular yesterday. It was noted that he had excellent biphasic flow in the posterior tibial of the right lower extremity. He has been using Hydrofera Blue and Santyl to the wound beds. He has chronic pain to the wound sites. 2/1; patient presents for follow-up. Patient had a PCR culture done at last clinic visit that showed E. coli, coagulase-negative staph, Candida parapsilosis and actinotignum schaalii. He is currently on ciprofloxacin that covers the E. coli. I will switch him over to Augmentin. We ordered [facility] antibiotic ointment and patient has paid for this and states it is coming in the mail tomorrow. For now he has been using Vashe wet-to-dry dressings. Patient History Information obtained from Patient, Chart. Family History Unknown History. Social History Former smoker, Marital Status - Single, Alcohol Use - Never, Drug Use - No History, Caffeine Use - Rarely. Medical History Cardiovascular Patient has history of Hypertension, Peripheral Arterial Disease, Peripheral Venous  Disease Hospitalization/Surgery History - femoral-tibial bypass graft right 12/12/22. - radical prostatectomy 2011. - tonsillectomy age 44. Medical A Surgical History Notes nd Cardiovascular Hyperlipidemia Gastrointestinal GERD Oncologic prostate ca Objective Constitutional respirations regular, non-labored and within target range for patient.. Vitals Time Taken: 9:27 AM, Temperature: 97.5 F, Pulse: 52 bpm, Respiratory Rate: 18 breaths/min, Blood Pressure: 106/66 mmHg. Psychiatric pleasant and cooperative. General Notes: Right lower extremity: T the anterior distal aspect there is an open wound with nonviable surface and granulation tissue. T the medial right leg o o there is an incision site with a large dehisced wound with non viable tissue throughout. no purulent drainage. 2+ pitting edema to the knee Integumentary (Hair, Skin) [name], [name] ([id number]) [id number] Page 6 of 8 Wound #1 status is Open. Original cause of wound was Trauma. The date acquired was: 09/13/2022. The wound has been in treatment 2 weeks. The wound is located on the Right,Circumferential Lower Leg. The wound measures 4.6cm length x 13cm width x 0.2cm depth; 46.967cm^2 area and 9.393cm^3 volume. There is Fat Layer (Subcutaneous Tissue) exposed. There is no tunneling or undermining noted. There is a large amount of serosanguineous drainage noted. The wound margin is distinct with the outline attached to the wound base. There is medium (34-66%) red, pink granulation within the wound bed. There is a medium (34-66%) amount of necrotic tissue within the wound bed including Adherent Slough. The periwound skin appearance exhibited: Erythema. The periwound skin appearance did not exhibit: Callus, Crepitus, Excoriation, Induration, Rash, Scarring, Dry/Scaly, Maceration, Atrophie Blanche, Cyanosis, Ecchymosis, Hemosiderin Staining, Mottled, Pallor, Rubor. The surrounding wound skin color  is noted with erythema which is circumferential. Periwound temperature was noted as No Abnormality. The periwound has tenderness on palpation. Wound #2 status is Open. Original cause of wound was Surgical Injury. The date acquired was: 01/03/2023. The wound has been in treatment 2 weeks. The wound is located on the Right,Medial Lower Leg. The wound measures 6.5cm length x 2.5cm width x 1.5cm depth; 12.763cm^2 area and 19.144cm^3 volume. There is Fat Layer (Subcutaneous Tissue) exposed. There is no tunneling or undermining noted. There is a medium amount of serosanguineous drainage noted. The wound margin is distinct with the outline attached to the wound base. There is medium (34-66%) granulation within the wound bed. There is a medium (34- 66%) amount  of necrotic tissue within the wound bed including Adherent Slough. The periwound skin appearance exhibited: Maceration, Erythema. The periwound skin appearance did not exhibit: Callus, Crepitus, Excoriation, Induration, Rash, Scarring, Dry/Scaly, Atrophie Blanche, Cyanosis, Ecchymosis, Hemosiderin Staining, Mottled, Pallor, Rubor. The surrounding wound skin color is noted with erythema which is circumferential. Periwound temperature was noted as No Abnormality. The periwound has tenderness on palpation. Assessment Active Problems ICD-10 Atherosclerosis of native arteries of right leg with ulceration of other part of lower leg Unspecified open wound, right lower leg, initial encounter Disruption of external operation (surgical) wound, not elsewhere classified, initial encounter Patient's wounds are stable. I debrided nonviable tissue. I recommended he start the [facility] antibiotic ointment when it arrives. In the meantime he can use Vashe wet-to-dry dressings. His culture grew E. coli and actinotignum schaalii sensitive to Augmentin. I will go ahead and switch him to this oral antibiotic. His swelling is not well-controlled and he will need to start  compression wraps soon especially since this is affecting the dehisced wound. We will have him come in early next week to apply this and then follow-up soon after to see if he tolerated this well. Follow-up in 1 week. Procedures Wound #1 Pre-procedure diagnosis of Wound #1 is an Arterial Insufficiency Ulcer located on the Right,Circumferential Lower Leg .Severity of Tissue Pre Debridement is: Fat layer exposed. There was a Selective/Open Wound Non-Viable Tissue Debridement with a total area of 55.9 sq cm performed by Kalman Shan, DO. With the following instrument(s): Curette to remove Viable and Non-Viable tissue/material. Material removed includes [place] after achieving pain control using Lidocaine. No specimens were taken. A time out was conducted at 09:50, prior to the start of the procedure. A Minimum amount of bleeding was controlled with Pressure. The procedure was tolerated well with a pain level of 0 throughout and a pain level of 0 following the procedure. Post Debridement Measurements: 4.6cm length x 13cm width x 0.2cm depth; 9.393cm^3 volume. Character of Wound/Ulcer Post Debridement is improved. Severity of Tissue Post Debridement is: Fat layer exposed. Post procedure Diagnosis Wound #1: Same as Pre-Procedure Plan Follow-up Appointments: Return Appointment in 1 week. - w/ Dr. Heber [place] next Thursday 02/01/23 @ 12:30 Rm # 9 w/ Allayne Butcher Return Appointment in 2 weeks. - w/ Dr. Heber [place] and Allayne Butcher Rm # 9 (go ahead and schedule) Other: - Pick up your antibiotics from your pharmacy as soon as possible. Start applying the [place] once you get it. Bring it to every appointment. Anesthetic: (In clinic) Topical Lidocaine 5% applied to wound bed Bathing/ Shower/ Hygiene: May shower with protection but do not get wound dressing(s) wet. Protect dressing(s) with water repellant cover (for example, large plastic bag) or a cast cover and may then take shower. - May wash wounds with dial  antibacterial soap in the shower if you immediately dress wound after shower. Edema Control - Lymphedema / SCD / Other: Elevate legs to the level of the heart or above for 30 minutes daily and/or when sitting for 3-4 times a day throughout the day. Avoid standing for long periods of time. The following medication(s) was prescribed: amoxicillin-pot clavulanate oral 875 mg-125 mg tablet 1 1 tablet oral twice a day x 7 days starting 01/25/2023 WOUND #1: - Lower Leg Wound Laterality: Right, Circumferential Cleanser: Soap and Water 1 x Per Day/15 Days Discharge Instructions: May shower and wash wound with dial antibacterial soap and water prior to dressing change. Cleanser: Wound Cleanser (Generic) 1 x Per Day/15 Days Discharge Instructions: Cleanse the  wound with wound cleanser prior to applying a clean dressing using gauze sponges, not tissue or cotton balls. Prim Dressing: vashe wet to dry 1 x Per Day/15 Days ary Secondary Dressing: ABD Pad, 5x9 (Generic) 1 x Per Day/15 Days Discharge Instructions: Apply over primary dressing as directed. Secured With: [employer], 4.5x3.1 (in/yd) (Generic) 1 x Per Day/15 Days Discharge Instructions: Secure with Kerlix as directed. Secured With: 76M Medipore H Soft Cloth Surgical T ape, 4 x 10 (in/yd) (Generic) 1 x Per Day/15 Days Discharge Instructions: Secure with tape as directed. Secured With: [employer] Size 5, 10 (yds) (Generic) 1 x Per Day/15 Days WOUND #2: - Lower Leg Wound Laterality: Right, Medial [name], [name] ([id number]) [phone].pdf Page 7 of 8 Cleanser: Soap and Water 1 x Per Day/15 Days Discharge Instructions: May shower and wash wound with dial antibacterial soap and water prior to dressing change. Cleanser: Wound Cleanser (Generic) 1 x Per Day/15 Days Discharge Instructions: Cleanse the wound with wound cleanser prior to applying a clean dressing using gauze sponges, not tissue or cotton balls. Prim  Dressing: vashe wet to dry 1 x Per Day/15 Days ary Secondary Dressing: ABD Pad, 5x9 (Generic) 1 x Per Day/15 Days Discharge Instructions: Apply over primary dressing as directed. Secured With: [employer], 4.5x3.1 (in/yd) (Generic) 1 x Per Day/15 Days Discharge Instructions: Secure with Kerlix as directed. Secured With: 76M Medipore H Soft Cloth Surgical T ape, 4 x 10 (in/yd) (Generic) 1 x Per Day/15 Days Discharge Instructions: Secure with tape as directed. Secured With: [employer] Size 5, 10 (yds) (Generic) 1 x Per Day/15 Days Com pression Wrap: tubi grip 1 x Per Day/15 Days 1. In office sharp debridement 2. Vashe wet-to-dry dressings 3. [facility] antibiotic ointment to start when it arrives 4. Follow-up in 1 week 5. Augmentin Electronic Signature(s) Signed: 01/25/2023 1:45:23 PM By: Kalman Shan DO Entered By: Kalman Shan on 01/25/2023 10:21:32 -------------------------------------------------------------------------------- HxROS Details Patient Name: Date of Service: Vincent Eng Lewis. 01/25/2023 9:30 A M Medical Record Number: [id number] Patient Account Number: [id number] Date of Birth/Sex: Treating RN: 1951/02/03 (72 y.o. M) Primary Care Provider: Maggie Font Other Clinician: Referring Provider: Treating Provider/Extender: Wilnette Kales in Treatment: 2 Information Obtained From Patient Chart Cardiovascular Medical History: Positive for: Hypertension; Peripheral Arterial Disease; Peripheral Venous Disease Past Medical History Notes: Hyperlipidemia Gastrointestinal Medical History: Past Medical History Notes: GERD Oncologic Medical History: Past Medical History Notes: prostate ca Immunizations Pneumococcal Vaccine: Received Pneumococcal Vaccination: Yes Received Pneumococcal Vaccination On or After 60th Birthday: Yes Implantable Devices None Hospitalization / Surgery History Type of  Hospitalization/Surgery femoral-tibial bypass graft right 12/12/22 radical prostatectomy 2011 [name], [name] ([id number]) [id number] Page 8 of 8 tonsillectomy age 71 Family and Social History Unknown History: Yes; Former smoker; Marital Status - Single; Alcohol Use: Never; Drug Use: No History; Caffeine Use: Rarely; Financial Concerns: No; Food, Clothing or Shelter Needs: No; Support System Lacking: No; Transportation Concerns: No Electronic Signature(s) Signed: 01/25/2023 1:45:23 PM By: Kalman Shan DO Entered By: Kalman Shan on 01/25/2023 10:04:46 -------------------------------------------------------------------------------- SuperBill Details Patient Name: Date of Service: Vincent Limber. 01/25/2023 Medical Record Number: [id number] Patient Account Number: [id number] Date of Birth/Sex: Treating RN: 15-Sep-1951 (72 y.o. Erie Noe Primary Care Provider: Maggie Font Other Clinician: Referring Provider: Treating Provider/Extender: Wilnette Kales in Treatment: 2 Diagnosis Coding ICD-10 Codes Code Description [phone] Atherosclerosis of native arteries of right leg with ulceration of  other part of lower leg S81.801A Unspecified open wound, right lower leg, initial encounter T81.31XA Disruption of external operation (surgical) wound, not elsewhere classified, initial encounter Facility Procedures : CPT4 Code: 03009233 Description: 00762 - DEBRIDE WOUND 1ST 20 SQ CM OR < ICD-10 Diagnosis Description S81.801A Unspecified open wound, right lower leg, initial encounter Modifier: Quantity: 1 : CPT4 Code: 26333545 Description: 62563 - DEBRIDE WOUND EA ADDL 20 SQ CM ICD-10 Diagnosis Description S81.801A Unspecified open wound, right lower leg, initial encounter Modifier: Quantity: 2 Physician Procedures : CPT4 Code Description Modifier [id number] - WC PHYS DEBR WO ANESTH 20 SQ CM ICD-10 Diagnosis  Description S81.801A Unspecified open wound, right lower leg, initial encounter Quantity: 1 : [id number] - WC PHYS DEBR WO ANESTH EA ADD 20 CM ICD-10 Diagnosis Description S81.801A Unspecified open wound, right lower leg, initial encounter Quantity: 2 Electronic Signature(s) Signed: 01/25/2023 1:45:23 PM By: Kalman Shan DO Entered By: Kalman Shan on 01/25/2023 10:22:11

## 2023-01-26 NOTE — Progress Notes (Signed)
DANH, BAYUS ([id number]) [id number] Page 1 of 8 Visit Report for 01/25/2023 Arrival Information Details Patient Name: Date of Service: [name] 01/25/2023 9:30 A M Medical Record Number: [id number] Patient Account Number: [id number] Date of Birth/Sex: Treating RN: [name][birth year] (72 y.o. M) Primary Care [name]: [name] Other Clinician: Referring [name]: Treating [name]/Extender: [name] in Treatment: 2 Visit Information History Since Last Visit Added or deleted any medications: No Patient Arrived: Cane Any new allergies or adverse reactions: No Arrival Time: 09:25 Had a fall or experienced change in No Accompanied By: self activities of daily living that may affect Transfer Assistance: None risk of falls: Patient Identification Verified: Yes Signs or symptoms of abuse/neglect since last visito No Secondary Verification Process Completed: Yes Hospitalized since last visit: No Patient Requires Transmission-Based Precautions: No Implantable device outside of the clinic excluding No Patient Has Alerts: No cellular tissue based products placed in the center since last visit: Has Dressing in Place as Prescribed: Yes Pain Present Now: Yes Electronic Signature(s) Signed: 01/26/2023 12:35:45 PM By: [name] Entered By: [name] on 01/25/2023 09:26:35 -------------------------------------------------------------------------------- Lower Extremity Assessment Details Patient Name: Date of Service: Vincent Eng R. 01/25/2023 9:30 A M Medical Record Number: [id number] Patient Account Number: [id number] Date of Birth/Sex: Treating RN: Apr [birth year], [name] (72 y.o. M) Primary Care Eletha Culbertson: [name] Other Clinician: Referring Lachandra Dettmann: Treating [name]/Extender: [name] in Treatment: 2 Edema Assessment Assessed: [Left: No] [Right: No] Edema: [Left: Ye]  [Right: s] Calf Left: Right: Point of Measurement: 42 cm From Medial Instep 45.6 cm Ankle Left: Right: Point of Measurement: 9 cm From Medial Instep 27 cm Electronic Signature(s) Signed: 01/26/2023 12:35:45 PM By: [name] Entered By: [name] on 01/25/2023 09:39:53 Lewis, Vincent R ([id number]) [id number] Page 2 of 8 -------------------------------------------------------------------------------- Multi Wound Chart Details Patient Name: Date of Service: [name] 01/25/2023 9:30 A M Medical Record Number: [id number] Patient Account Number: [id number] Date of Birth/Sex: Treating RN: [birth year] (72 y.o. M) Primary Care Nailyn Dearinger: [name] Other Clinician: Referring Andree Golphin: Treating Josalin Carneiro/Extender: [name] in Treatment: 2 Vital Signs Height(in): Pulse(bpm): 52 Weight(lbs): Blood Pressure(mmHg): 106/66 Body Mass Index(BMI): Temperature(F): 97.5 Respiratory Rate(breaths/min): 18 [1:Photos:] [N/A:N/A] Right, Circumferential Lower Leg Right, Medial Lower Leg N/A Wound Location: Trauma Surgical Injury N/A Wounding Event: Arterial Insufficiency Ulcer Dehisced Wound N/A Primary Etiology: Hypertension, Peripheral Arterial Hypertension, Peripheral Arterial N/A Comorbid History: Disease, Peripheral Venous Disease Disease, Peripheral Venous Disease 09/13/2022 01/03/2023 N/A Date Acquired: 2 2 N/A Lewis of Treatment: Open Open N/A Wound Status: No No N/A Wound Recurrence: 4.6x13x0.2 6.5x2.5x1.5 N/A Measurements L x W x D (cm) 46.967 12.763 N/A A (cm) : rea 9.393 19.144 N/A Volume (cm) : 74.40% -36.60% N/A % Reduction in A rea: 74.40% -412.10% N/A % Reduction in Volume: Full Thickness With Exposed Support Full Thickness With Exposed Support N/A Classification: Structures Structures Large Medium N/A Exudate A mount: Serosanguineous Serosanguineous N/A Exudate Type: red, brown  red, brown N/A Exudate Color: Distinct, outline attached Distinct, outline attached N/A Wound Margin: Medium (34-66%) Medium (34-66%) N/A Granulation A mount: Red, Pink N/A N/A Granulation Quality: Medium (34-66%) Medium (34-66%) N/A Necrotic A mount: Fat Layer (Subcutaneous Tissue): Yes Fat Layer (Subcutaneous Tissue): Yes N/A Exposed Structures: Fascia: No Fascia: No Tendon: No Tendon: No Muscle: No Muscle: No Joint: No Joint: No Bone: No Bone: No None Small (1-33%) N/A Epithelialization: Debridement -  Selective/Open Wound N/A N/A Debridement: Pre-procedure Verification/Time Out 09:50 N/A N/A Taken: Lidocaine N/A N/A Pain Control: Slough N/A N/A Tissue Debrided: Non-Viable Tissue N/A N/A Level: 55.9 N/A N/A Debridement A (sq cm): rea Curette N/A N/A Instrument: Minimum N/A N/A Bleeding: Pressure N/A N/A Hemostasis A chieved: 0 N/A N/A Procedural Pain: 0 N/A N/A Post Procedural Pain: Procedure was tolerated well N/A N/A Debridement Treatment Response: 4.6x13x0.2 N/A N/A Post Debridement Measurements L x W x D (cm) 9.393 N/A N/A Post Debridement Volume: (cm) Excoriation: No Excoriation: No N/A Periwound Skin TextureBRAXSTON, [name] ([id number]) [id number].pdf Page 3 of 8 Induration: No Induration: No Callus: No Callus: No Crepitus: No Crepitus: No Rash: No Rash: No Scarring: No Scarring: No Maceration: No Maceration: Yes N/A Periwound Skin Moisture: Dry/Scaly: No Dry/Scaly: No Erythema: Yes Erythema: Yes N/A Periwound Skin Color: Atrophie Blanche: No Atrophie Blanche: No Cyanosis: No Cyanosis: No Ecchymosis: No Ecchymosis: No Hemosiderin Staining: No Hemosiderin Staining: No Mottled: No Mottled: No Pallor: No Pallor: No Rubor: No Rubor: No Circumferential Circumferential N/A Erythema Location: No Abnormality No Abnormality N/A Temperature: Yes Yes N/A Tenderness on Palpation: Debridement N/A  N/A Procedures Performed: Treatment Notes Electronic Signature(s) Signed: 01/25/2023 1:45:23 PM By: Kalman Shan DO Entered By: Kalman Shan on 01/25/2023 10:02:42 -------------------------------------------------------------------------------- Multi-Disciplinary Care Plan Details Patient Name: Date of Service: Vincent Eng R. 01/25/2023 9:30 A M Medical Record Number: [id number] Patient Account Number: [id number] Date of Birth/Sex: Treating RN: [name]-11-Lewis (72 y.o. [name], [name] Primary Care Keegan Bensch: [name] Other Clinician: Referring Lunna Vogelgesang: Treating Winnell Bento/Extender: [name] in Treatment: 2 Active Inactive Orientation to the Wound Care Program Nursing Diagnoses: Knowledge deficit related to the wound healing center program Goals: Patient/caregiver will verbalize understanding of the [place] Program Date Initiated: 01/11/2023 Target Resolution Date: 02/01/2023 Goal Status: Active Interventions: Provide education on orientation to the wound center Notes: Wound/Skin Impairment Nursing Diagnoses: Impaired tissue integrity Knowledge deficit related to ulceration/compromised skin integrity Goals: Patient will have a decrease in wound volume by X% from date: (specify in notes) Date Initiated: 01/11/2023 Target Resolution Date: 02/01/2023 Goal Status: Active Patient/caregiver will verbalize understanding of skin care regimen Date Initiated: 01/11/2023 Target Resolution Date: 02/03/2023 Goal Status: Active Ulcer/skin breakdown will have a volume reduction of 30% by week 4 Date Initiated: 01/11/2023 Target Resolution Date: 02/01/2023 Goal Status: Active [name], [name] ([id number]) [id number].pdf Page 4 of 8 Interventions: Assess patient/caregiver ability to obtain necessary supplies Assess patient/caregiver ability to perform ulcer/skin care regimen upon admission and as needed Assess  ulceration(s) every visit Notes: Electronic Signature(s) Signed: 01/26/2023 8:21:34 AM By: Rhae Hammock RN Entered By: Rhae Hammock on 01/25/2023 09:53:11 -------------------------------------------------------------------------------- Pain Assessment Details Patient Name: Date of Service: Vincent Eng R. 01/25/2023 9:30 A M Medical Record Number: [id number] Patient Account Number: [id number] Date of Birth/Sex: Treating RN: 10-30-[name] (72 y.o. M) Primary Care Messi Twedt: [name] Other Clinician: Referring Ayman Brull: Treating Teyona Nichelson/Extender: [name] in Treatment: 2 Active Problems Location of Pain Severity and Description of Pain Patient Has Paino Yes Site Locations Pain Location: Pain in Ulcers Rate the pain. Current Pain Level: 10 Pain Management and Medication Current Pain Management: Electronic Signature(s) Signed: 01/26/2023 12:35:45 PM By: [name] Entered By: [name] on 01/25/2023 09:28:30 -------------------------------------------------------------------------------- Patient/Caregiver Education Details Patient Name: Date of Service: [name] 2/1/2024andnbsp9:30 A M Medical Record Number: [id number] Patient Account Number: [id number] Date of Birth/Gender: Treating  RN: [birth year]-[name] (73 y.o. Erie Noe Primary Care Physician: [name] Other Clinician: DAMIEL, [name] ([id number]) [id number] Page 5 of 8 Referring Physician: Treating Physician/Extender: [name] in Treatment: 2 Education Assessment Education Provided To: Patient Education Topics Provided Wound/Skin Impairment: Methods: Explain/Verbal Responses: Reinforcements needed, State content correctly Electronic Signature(s) Signed: 01/26/2023 8:21:34 AM By: Rhae Hammock RN Entered By: Rhae Hammock on 01/25/2023  10:04:43 -------------------------------------------------------------------------------- Wound Assessment Details Patient Name: Date of Service: Vincent Eng R. 01/25/2023 9:30 A M Medical Record Number: [id number] Patient Account Number: [id number] Date of Birth/Sex: Treating RN: [name][birth year] (72 y.o. M) Primary Care Wyllow Seigler: [name] Other Clinician: Referring Arrian Manson: Treating Berthe Oley/Extender: [name] in Treatment: 2 Wound Status Wound Number: 1 Primary Arterial Insufficiency Ulcer Etiology: Wound Location: Right, Circumferential Lower Leg Wound Status: Open Wounding Event: Trauma Comorbid Hypertension, Peripheral Arterial Disease, Peripheral Venous Date Acquired: 09/13/2022 History: Disease Lewis Of Treatment: 2 Clustered Wound: No Photos Wound Measurements Length: (cm) 4.6 Width: (cm) 13 Depth: (cm) 0.2 Area: (cm) 46.967 Volume: (cm) 9.393 % Reduction in Area: 74.4% % Reduction in Volume: 74.4% Epithelialization: None Tunneling: No Undermining: No Wound Description Classification: Full Thickness With Exposed Support Structures Wound Margin: Distinct, outline attached Exudate Amount: Large Exudate Type: Serosanguineous Exudate Color: red, brown Foul Odor After Cleansing: No Slough/Fibrino Yes Wound Bed [name], [name] ([id number]) [id number] Page 6 of 8 Granulation Amount: Medium (34-66%) Exposed Structure Granulation Quality: Red, Pink Fascia Exposed: No Necrotic Amount: Medium (34-66%) Fat Layer (Subcutaneous Tissue) Exposed: Yes Necrotic Quality: Adherent Slough Tendon Exposed: No Muscle Exposed: No Joint Exposed: No Bone Exposed: No Periwound Skin Texture Texture Color No Abnormalities Noted: No No Abnormalities Noted: No Callus: No Atrophie Blanche: No Crepitus: No Cyanosis: No Excoriation: No Ecchymosis: No Induration: No Erythema: Yes Rash: No Erythema Location:  Circumferential Scarring: No Hemosiderin Staining: No Mottled: No Moisture Pallor: No No Abnormalities Noted: No Rubor: No Dry / Scaly: No Maceration: No Temperature / Pain Temperature: No Abnormality Tenderness on Palpation: Yes Treatment Notes Wound #1 (Lower Leg) Wound Laterality: Right, Circumferential Cleanser Soap and Water Discharge Instruction: May shower and wash wound with dial antibacterial soap and water prior to dressing change. Wound Cleanser Discharge Instruction: Cleanse the wound with wound cleanser prior to applying a clean dressing using gauze sponges, not tissue or cotton balls. Peri-Wound Care Topical Primary Dressing vashe wet to dry Secondary Dressing ABD Pad, 5x9 Discharge Instruction: Apply over primary dressing as directed. Secured With [employer], 4.5x3.1 (in/yd) Discharge Instruction: Secure with Kerlix as directed. 73M Medipore H Soft Cloth Surgical T ape, 4 x 10 (in/yd) Discharge Instruction: Secure with tape as directed. Stretch Net Size 5, 10 (yds) Compression Wrap Compression Stockings Add-Ons Electronic Signature(s) Signed: 01/26/2023 12:35:45 PM By: [name] Entered By: [name] on 01/25/2023 09:44:53 -------------------------------------------------------------------------------- Wound Assessment Details Patient Name: Date of Service: Vincent Eng R. 01/25/2023 9:30 A M Medical Record Number: [id number] Patient Account Number: [id number] Date of Birth/Sex: Treating RN: [name], [name] (72 y.o. M) Primary Care Seraiah Nowack: [name] Other Clinician: MILFRED, [name] ([id number]) [id number] Page 7 of 8 Referring Shabrea Weldin: Treating Jerauld Bostwick/Extender: [name] in Treatment: 2 Wound Status Wound Number: 2 Primary Dehisced Wound Etiology: Wound Location: Right, Medial Lower Leg Wound Status: Open Wounding Event: Surgical Injury Comorbid Hypertension,  Peripheral Arterial Disease, Peripheral Venous Date Acquired: 01/03/2023 History: Disease Lewis Of Treatment: 2 Clustered Wound: No  Photos Wound Measurements Length: (cm) 6.5 Width: (cm) 2.5 Depth: (cm) 1.5 Area: (cm) 12.763 Volume: (cm) 19.144 % Reduction in Area: -36.6% % Reduction in Volume: -412.1% Epithelialization: Small (1-33%) Tunneling: No Undermining: No Wound Description Classification: Full Thickness With Exposed Support Structures Wound Margin: Distinct, outline attached Exudate Amount: Medium Exudate Type: Serosanguineous Exudate Color: red, brown Foul Odor After Cleansing: No Slough/Fibrino Yes Wound Bed Granulation Amount: Medium (34-66%) Exposed Structure Necrotic Amount: Medium (34-66%) Fascia Exposed: No Necrotic Quality: Adherent Slough Fat Layer (Subcutaneous Tissue) Exposed: Yes Tendon Exposed: No Muscle Exposed: No Joint Exposed: No Bone Exposed: No Periwound Skin Texture Texture Color No Abnormalities Noted: No No Abnormalities Noted: No Callus: No Atrophie Blanche: No Crepitus: No Cyanosis: No Excoriation: No Ecchymosis: No Induration: No Erythema: Yes Rash: No Erythema Location: Circumferential Scarring: No Hemosiderin Staining: No Mottled: No Moisture Pallor: No No Abnormalities Noted: No Rubor: No Dry / Scaly: No Maceration: Yes Temperature / Pain Temperature: No Abnormality Tenderness on Palpation: Yes Treatment Notes Wound #2 (Lower Leg) Wound Laterality: Right, Medial Cleanser Soap and Water Discharge Instruction: May shower and wash wound with dial antibacterial soap and water prior to dressing change. Wound Cleanser Discharge Instruction: Cleanse the wound with wound cleanser prior to applying a clean dressing using gauze sponges, not tissue or cotton balls. [name], [name] ([id number]) [id number] Page 8 of 8 Peri-Wound Care Topical Primary Dressing vashe wet to dry Secondary  Dressing ABD Pad, 5x9 Discharge Instruction: Apply over primary dressing as directed. Secured With [employer], 4.5x3.1 (in/yd) Discharge Instruction: Secure with Kerlix as directed. 18M Medipore H Soft Cloth Surgical T ape, 4 x 10 (in/yd) Discharge Instruction: Secure with tape as directed. Stretch Net Size 5, 10 (yds) Compression Wrap tubi grip Compression Stockings Add-Ons Electronic Signature(s) Signed: 01/26/2023 12:35:45 PM By: [name] Entered By: [name] on 01/25/2023 09:46:15 -------------------------------------------------------------------------------- Vitals Details Patient Name: Date of Service: Vincent Eng R. 01/25/2023 9:30 A M Medical Record Number: [id number] Patient Account Number: [id number] Date of Birth/Sex: Treating RN: [birth year]-[name] (72 y.o. M) Primary Care Diar Berkel: [name] Other Clinician: Referring Sylvester Minton: Treating Habiba Treloar/Extender: [name] in Treatment: 2 Vital Signs Time Taken: 09:27 Temperature (F): 97.5 Pulse (bpm): 52 Respiratory Rate (breaths/min): 18 Blood Pressure (mmHg): 106/66 Reference Range: 80 - 120 mg / dl Electronic Signature(s) Signed: 01/26/2023 12:35:45 PM By: [name] Entered By: [name] on 01/25/2023 57:01:77

## 2023-01-30 ENCOUNTER — Encounter (HOSPITAL_BASED_OUTPATIENT_CLINIC_OR_DEPARTMENT_OTHER): Payer: Medicare Other | Admitting: Internal Medicine

## 2023-01-30 DIAGNOSIS — I70238 Atherosclerosis of native arteries of right leg with ulceration of other part of lower right leg: Secondary | ICD-10-CM | POA: Diagnosis not present

## 2023-01-30 DIAGNOSIS — I87311 Chronic venous hypertension (idiopathic) with ulcer of right lower extremity: Secondary | ICD-10-CM | POA: Diagnosis not present

## 2023-01-30 DIAGNOSIS — S81801A Unspecified open wound, right lower leg, initial encounter: Secondary | ICD-10-CM

## 2023-01-30 DIAGNOSIS — L97815 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 without evidence of necrosis: Secondary | ICD-10-CM | POA: Diagnosis not present

## 2023-01-30 DIAGNOSIS — T8131XA Disruption of external operation (surgical) wound, not elsewhere classified, initial encounter: Secondary | ICD-10-CM | POA: Diagnosis not present

## 2023-01-31 NOTE — Progress Notes (Signed)
EL, PILE ([id number]) [phone].pdf Page 1 of 10 Visit Report for 01/30/2023 Chief Complaint Document Details Patient Name: Date of Service: [name] 01/30/2023 7:45 A M Medical Record Number: [id number] Patient Account Number: [id number] Date of Birth/Sex: Treating RN: September 30, 1951 (73 y.o. M) Primary Care Provider: Maggie Font Other Clinician: Referring Provider: Treating Provider/Extender: Wilnette Kales in Treatment: 2 Information Obtained from: Patient Chief Complaint 01/11/2023; arterial right lower extremity wound, surgical dehiscence to the medial right lower extremity Electronic Signature(s) Signed: 01/30/2023 9:21:56 AM By: Kalman Shan DO Entered By: Kalman Shan on 01/30/2023 09:13:38 -------------------------------------------------------------------------------- Debridement Details Patient Name: Date of Service: Vincent Eng R. 01/30/2023 7:45 A M Medical Record Number: [id number] Patient Account Number: [id number] Date of Birth/Sex: Treating RN: [birth year] (72 y.o. Lorette Ang, Meta.Reding Primary Care Provider: Maggie Font Other Clinician: Referring Provider: Treating Provider/Extender: Wilnette Kales in Treatment: 2 Debridement Performed for Assessment: Wound #1 Right,Circumferential Lower Leg Performed By: Physician Kalman Shan, DO Debridement Type: Debridement Severity of Tissue Pre Debridement: Fat layer exposed Level of Consciousness (Pre-procedure): Awake and Alert Pre-procedure Verification/Time Out Yes - 08:40 Taken: Start Time: 08:41 Pain Control: Lidocaine 5% topical ointment T Area Debrided (L x W): otal 5 (cm) x 6 (cm) = 30 (cm) Tissue and other material debrided: Viable, Non-Viable, Slough, Subcutaneous, Skin: Dermis , Skin: Epidermis, Slough Level: Skin/Subcutaneous Tissue Debridement Description: Excisional Instrument: Curette Bleeding:  Minimum Hemostasis Achieved: Pressure End Time: 09:00 Procedural Pain: 0 Post Procedural Pain: 3 Response to Treatment: Procedure was tolerated well Level of Consciousness (Post- Awake and Alert procedure): Post Debridement Measurements of Total Wound Length: (cm) 5 Width: (cm) 13 Depth: (cm) 0.2 Volume: (cm) 10.21 Character of Wound/Ulcer Post Debridement: Requires Further Debridement Severity of Tissue Post Debridement: Fat layer exposed KENNETT, SYMES R ([id number]) [phone].pdf Page 2 of 10 Post Procedure Diagnosis Same as Pre-procedure Electronic Signature(s) Signed: 01/30/2023 9:05:03 AM By: Kalman Shan DO Signed: 01/30/2023 6:17:04 PM By: Deon Pilling RN, BSN Entered By: Deon Pilling on 01/30/2023 09:01:50 -------------------------------------------------------------------------------- Debridement Details Patient Name: Date of Service: Vincent Eng R. 01/30/2023 7:45 A M Medical Record Number: [id number] Patient Account Number: [id number] Date of Birth/Sex: Treating RN: May 09, 1951 (72 y.o. Lorette Ang, Meta.Reding Primary Care Provider: Maggie Font Other Clinician: Referring Provider: Treating Provider/Extender: Wilnette Kales in Treatment: 2 Debridement Performed for Assessment: Wound #2 Right,Medial Lower Leg Performed By: Physician Kalman Shan, DO Debridement Type: Debridement Level of Consciousness (Pre-procedure): Awake and Alert Pre-procedure Verification/Time Out Yes - 08:40 Taken: Start Time: 08:41 Pain Control: Lidocaine 5% topical ointment T Area Debrided (L x W): otal 7.5 (cm) x 3.2 (cm) = 24 (cm) Tissue and other material debrided: Viable, Non-Viable, Slough, Subcutaneous, Biofilm, Slough Level: Skin/Subcutaneous Tissue Debridement Description: Excisional Instrument: Curette Bleeding: Minimum Hemostasis Achieved: Pressure End Time: 09:00 Procedural Pain: 0 Post Procedural Pain:  3 Response to Treatment: Procedure was tolerated well Level of Consciousness (Post- Awake and Alert procedure): Post Debridement Measurements of Total Wound Length: (cm) 7.5 Width: (cm) 3.2 Depth: (cm) 1.5 Volume: (cm) 28.274 Character of Wound/Ulcer Post Debridement: Requires Further Debridement Post Procedure Diagnosis Same as Pre-procedure Electronic Signature(s) Signed: 01/30/2023 9:05:03 AM By: Kalman Shan DO Signed: 01/30/2023 6:17:04 PM By: Deon Pilling RN, BSN Entered By: Deon Pilling on 01/30/2023 09:02:36 -------------------------------------------------------------------------------- HPI Details Patient Name: Date of Service: Vincent Fulton Reek R. 01/30/2023 7:45 A M Medical Record  Number: [id number] Patient Account Number: [id number] ALIZE, ACY ([id number]) [id number].pdf Page 3 of 10 Date of Birth/Sex: Treating RN: [birth year] (72 y.o. M) Primary Care Provider: Maggie Font Other Clinician: Referring Provider: Treating Provider/Extender: Wilnette Kales in Treatment: 2 History of Present Illness HPI Description: 01/11/2023 [name] is a 72 year old male with a past medical history of peripheral arterial disease status post femoropopliteal bypass graft on 12/12/2022 and prostate cancer that presents to the clinic with 2 wounds to his right lower extremity. He states that in October 2023 he was wearing a boot that rubbed into his leg creating a wound. Since the wound was not healing he had ABIs completed that showed a right ABI of 0.35 and TBI of 0. He was [facility], Dr. Donnetta Hutching who did a femoropopliteal bypass graft on the right on 12/12/2022. He reports chronic pain to the wound site. He reports having staples removed to the medial right leg 1 week ago and the wound site has dehisced. He has not been dressing either of the wound beds. He currently denies systemic signs of infection. 1/25;  patient presents for follow-up. He followed up with vein and vascular yesterday. It was noted that he had excellent biphasic flow in the posterior tibial of the right lower extremity. He has been using Hydrofera Blue and Santyl to the wound beds. He has chronic pain to the wound sites. 2/1; patient presents for follow-up. Patient had a PCR culture done at last clinic visit that showed E. coli, coagulase-negative staph, Candida parapsilosis and actinotignum schaalii. He is currently on ciprofloxacin that covers the E. coli. I will switch him over to Augmentin. We ordered [facility] antibiotic ointment and patient has paid for this and states it is coming in the mail tomorrow. For now he has been using Vashe wet-to-dry dressings. 2/6; patient presents for follow-up. He obtained the [facility] antibiotic in the mail. He started this Saturday. He has had no issues with it. He also continues to take Augmentin. He has a few days left. He denies systemic signs of infection. Electronic Signature(s) Signed: 01/30/2023 9:21:56 AM By: Kalman Shan DO Entered By: Kalman Shan on 01/30/2023 09:14:22 -------------------------------------------------------------------------------- Physical Exam Details Patient Name: Date of Service: Vincent Eng R. 01/30/2023 7:45 A M Medical Record Number: [id number] Patient Account Number: [id number] Date of Birth/Sex: Treating RN: [birth year] (72 y.o. M) Primary Care Provider: Maggie Font Other Clinician: Referring Provider: Treating Provider/Extender: Wilnette Kales in Treatment: 2 Constitutional respirations regular, non-labored and within target range for patient.. Cardiovascular 2+ dorsalis pedis/posterior tibialis pulses. Psychiatric pleasant and cooperative. Notes Right lower extremity: T the anterior distal aspect there is an open wound with nonviable surface and granulation tissue. T the medial right leg there is an o  o incision site with a large dehisced wound with non viable tissue throughout. no purulent drainage. 2+ pitting edema to the knee. No surrounding signs of soft tissue infection to any of the wound beds. Electronic Signature(s) Signed: 01/30/2023 9:21:56 AM By: Kalman Shan DO Entered By: Kalman Shan on 01/30/2023 09:14:56 -------------------------------------------------------------------------------- Physician Orders Details Patient Name: Date of Service: Vincent Eng R. 01/30/2023 7:45 A M Medical Record Number: [id number] Patient Account Number: [id number] BRECKEN, DEWOODY ([id number]) [phone].pdf Page 4 of 10 Date of Birth/Sex: Treating RN: 1951/06/11 (72 y.o. Hessie Diener Primary Care Provider: Other Clinician: Maggie Font Referring Provider: Treating Provider/Extender: Wilnette Kales  in Treatment: 2 Verbal / Phone Orders: No Diagnosis Coding ICD-10 Coding Code Description I70.238 Atherosclerosis of native arteries of right leg with ulceration of other part of lower leg S81.801A Unspecified open wound, right lower leg, initial encounter T81.31XA Disruption of external operation (surgical) wound, not elsewhere classified, initial encounter Follow-up Appointments ppointment in 1 week. - w/ Dr. Heber Dousman next Thursday 02/01/23 @ 12:30 Rm # 9 w/ Allayne Butcher Return A ppointment in 2 weeks. - w/ Dr. Heber [place] and Allayne Butcher Rm # 9 02/08/2023 0930 Return A Other: - Continue oral antibiotics Bring in topical antibiotics to each appt time. Anesthetic (In clinic) Topical Lidocaine 5% applied to wound bed Bathing/ Shower/ Hygiene May shower with protection but do not get wound dressing(s) wet. Protect dressing(s) with water repellant cover (for example, large plastic bag) or a cast cover and may then take shower. - May wash wounds with dial antibacterial soap in the shower if you immediately dress wound after shower. Negative  Presssure Wound Therapy Wound #2 Right,Medial Lower Leg Wound Vac to wound continuously at 139m/hg pressure - Will order to be placed for next week- 2-3 times a week dressing change. [employer] will call you with cost of wound vac before shipping to your home. Black and White Foam combination Edema Control - Lymphedema / SCD / Other Elevate legs to the level of the heart or above for 30 minutes daily and/or when sitting for 3-4 times a day throughout the day. Avoid standing for long periods of time. Home Health [facility]. May utilize formulary equivalent dressing for wound treatment orders unless otherwise specified. New wound care orders this week; continue Home Health for wound care. May utilize formulary equivalent dressing for wound treatment orders unless otherwise specified. - both wounds topical compounding antibiotics hydrofera blue abd pad, kerlix and coban until wound vac arrives next week then apply it to right medial lower leg. Continue the topical antibiotics hydrofera blue to right circumferential lower leg kerlix coban. Dressing changes to be completed [place] [facility] / Thursday / Saturday except when [facility] Other Home Health Orders/Instructions: Wound Treatment Wound #1 - Lower Leg Wound Laterality: Right, Circumferential Cleanser: Soap and Water 3 x Per Week/30 Days Discharge Instructions: May shower and wash wound with dial antibacterial soap and water prior to dressing change. Cleanser: Wound Cleanser (Generic) 3 x Per Week/30 Days Discharge Instructions: Cleanse the wound with wound cleanser prior to applying a clean dressing using gauze sponges, not tissue or cotton balls. Prim Dressing: Hydrofera Blue Classic Foam, 4x4 in 3 x Per Week/30 Days ary Discharge Instructions: apply over the topical antibiotics. Prim Dressing: Keystone topical compounding antibiotics 3 x  Per Week/30 Days ary Discharge Instructions: applied directly to wound bed. Secondary Dressing: ABD Pad, 5x9 (Generic) 3 x Per Week/30 Days Discharge Instructions: Apply over primary dressing as directed. Secondary Dressing: Zetuvit Plus 4x8 in 3 x Per Week/30 Days Discharge Instructions: Apply over primary dressing as directed. Secured With: [employer] GroupSize 5, 10 (yds) (Generic) 3 x Per Week/30 Days Compression Wrap: Kerlix Roll 4.5x3.1 (in/yd) 3 x Per Week/30 Days Discharge Instructions: Apply Kerlix and Coban compression as directed. Compression Wrap: Coban Self-Adherent Wrap 4x5 (in/yd) 3 x Per Week/30 Days Discharge Instructions: Apply over Kerlix as directed. Wound #2 - Lower Leg Wound Laterality: Right, Medial RBRAYLAN, FAUL([id number] [phone] Page 5 of 10 Cleanser: Soap and Water 3 x Per Week/30 Days  Discharge Instructions: May shower and wash wound with dial antibacterial soap and water prior to dressing change. Cleanser: Wound Cleanser (Generic) 3 x Per Week/30 Days Discharge Instructions: Cleanse the wound with wound cleanser prior to applying a clean dressing using gauze sponges, not tissue or cotton balls. Prim Dressing: Hydrofera Blue Classic Foam, 4x4 in 3 x Per Week/30 Days ary Discharge Instructions: apply over the topical antibiotics. Prim Dressing: Keystone topical compounding antibiotics 3 x Per Week/30 Days ary Discharge Instructions: applied directly to wound bed. Prim Dressing: CONTINUE KEYSTONE TOPICAL ANTIBIOTICS AND HYDROFERA BLUE UNTIL THE WOUND VAC ARRIVES. ONCE ARRIVES APPLY ary WHITE AND BLACK FOAM TO THE MEDIAL LOWER LEG. 3 x Per Week/30 Days Secondary Dressing: ABD Pad, 5x9 (Generic) 3 x Per Week/30 Days Discharge Instructions: Apply over primary dressing as directed. Secondary Dressing: Zetuvit Plus 4x8 in 3 x Per Week/30 Days Discharge Instructions: Apply over primary dressing as directed. Secured With: [employer] Size 5, 10 (yds) (Generic) 3 x Per Week/30 Days Compression Wrap: Kerlix Roll 4.5x3.1 (in/yd) 3 x Per Week/30 Days Discharge Instructions: Apply Kerlix and Coban compression as directed. Compression Wrap: Coban Self-Adherent Wrap 4x5 (in/yd) 3 x Per Week/30 Days Discharge Instructions: Apply over Kerlix as directed. Electronic Signature(s) Signed: 01/30/2023 9:21:56 AM By: Kalman Shan DO Previous Signature: 01/30/2023 9:05:03 AM Version By: Kalman Shan DO Entered By: Kalman Shan on 01/30/2023 09:15:06 -------------------------------------------------------------------------------- Problem List Details Patient Name: Date of Service: Vincent Eng R. 01/30/2023 7:45 A M Medical Record Number: [id number] Patient Account Number: [id number] Date of Birth/Sex: Treating RN: 1951/12/20 (72 y.o. Hessie Diener Primary Care Provider: Maggie Font Other Clinician: Referring Provider: Treating Provider/Extender: Wilnette Kales in Treatment: 2 Active Problems ICD-10 Encounter Code Description Active Date MDM Diagnosis I70.238 Atherosclerosis of native arteries of right leg with ulceration of other part of 01/11/2023 No Yes lower leg S81.801A Unspecified open wound, right lower leg, initial encounter 01/11/2023 No Yes T81.31XA Disruption of external operation (surgical) wound, not elsewhere classified, 01/11/2023 No Yes initial encounter Inactive Problems JAQUAVIOUS, MERCER ([id number]) [phone].pdf Page 6 of 10 Resolved Problems Electronic Signature(s) Signed: 01/30/2023 9:21:56 AM By: Kalman Shan DO Previous Signature: 01/30/2023 9:05:03 AM Version By: Kalman Shan DO Entered By: Kalman Shan on 01/30/2023 09:13:00 -------------------------------------------------------------------------------- Progress Note Details Patient Name: Date of Service: Vincent Eng R. 01/30/2023 7:45 A M Medical Record  Number: [id number] Patient Account Number: [id number] Date of Birth/Sex: Treating RN: 06-Aug-1951 (72 y.o. M) Primary Care Provider: Maggie Font Other Clinician: Referring Provider: Treating Provider/Extender: Wilnette Kales in Treatment: 2 Subjective Chief Complaint Information obtained from Patient 01/11/2023; arterial right lower extremity wound, surgical dehiscence to the medial right lower extremity History of Present Illness (HPI) 01/11/2023 [name] is a 72 year old male with a past medical history of peripheral arterial disease status post femoropopliteal bypass graft on 12/12/2022 and prostate cancer that presents to the clinic with 2 wounds to his right lower extremity. He states that in October 2023 he was wearing a boot that rubbed into his leg creating a wound. Since the wound was not healing he had ABIs completed that showed a right ABI of 0.35 and TBI of 0. He was referred to [facility], Dr. Donnetta Hutching who did a femoropopliteal bypass graft on the right on 12/12/2022. He reports chronic pain to the wound site. He reports having staples removed to the medial right leg 1 week ago and the wound  site has dehisced. He has not been dressing either of the wound beds. He currently denies systemic signs of infection. 1/25; patient presents for follow-up. He followed up with vein and vascular yesterday. It was noted that he had excellent biphasic flow in the posterior tibial of the right lower extremity. He has been using Hydrofera Blue and Santyl to the wound beds. He has chronic pain to the wound sites. 2/1; patient presents for follow-up. Patient had a PCR culture done at last clinic visit that showed E. coli, coagulase-negative staph, Candida parapsilosis and actinotignum schaalii. He is currently on ciprofloxacin that covers the E. coli. I will switch him over to Augmentin. We ordered [facility] antibiotic ointment and patient has paid for this and states it is  coming in the mail tomorrow. For now he has been using Vashe wet-to-dry dressings. 2/6; patient presents for follow-up. He obtained the [facility] antibiotic in the mail. He started this Saturday. He has had no issues with it. He also continues to take Augmentin. He has a few days left. He denies systemic signs of infection. Patient History Information obtained from Patient, Chart. Family History Unknown History. Social History Former smoker, Marital Status - Single, Alcohol Use - Never, Drug Use - No History, Caffeine Use - Rarely. Medical History Cardiovascular Patient has history of Hypertension, Peripheral Arterial Disease, Peripheral Venous Disease Hospitalization/Surgery History - femoral-tibial bypass graft right 12/12/22. - radical prostatectomy 2011. - tonsillectomy age 62. Medical A Surgical History Notes nd Cardiovascular Hyperlipidemia Gastrointestinal GERD Oncologic prostate ca Objective TRYSON, LUMLEY ([id number]) [id number].pdf Page 7 of 10 Constitutional respirations regular, non-labored and within target range for patient.. Vitals Time Taken: 7:59 AM, Temperature: 98.7 F, Pulse: 71 bpm, Respiratory Rate: 20 breaths/min, Blood Pressure: 157/68 mmHg. Cardiovascular 2+ dorsalis pedis/posterior tibialis pulses. Psychiatric pleasant and cooperative. General Notes: Right lower extremity: T the anterior distal aspect there is an open wound with nonviable surface and granulation tissue. T the medial right leg o o there is an incision site with a large dehisced wound with non viable tissue throughout. no purulent drainage. 2+ pitting edema to the knee. No surrounding signs of soft tissue infection to any of the wound beds. Integumentary (Hair, Skin) Wound #1 status is Open. Original cause of wound was Trauma. The date acquired was: 09/13/2022. The wound has been in treatment 2 weeks. The wound is located on the Right,Circumferential Lower Leg.  The wound measures 5cm length x 13cm width x 0.2cm depth; 51.051cm^2 area and 10.21cm^3 volume. There is Fat Layer (Subcutaneous Tissue) exposed. There is no tunneling or undermining noted. There is a large amount of serosanguineous drainage noted. The wound margin is distinct with the outline attached to the wound base. There is small (1-33%) red, pink granulation within the wound bed. There is a large (67- 100%) amount of necrotic tissue within the wound bed including Adherent Slough. The periwound skin appearance had no abnormalities noted for moisture. The periwound skin appearance exhibited: Scarring, Erythema. The periwound skin appearance did not exhibit: Callus, Crepitus, Excoriation, Induration, Rash, Atrophie Blanche, Cyanosis, Ecchymosis, Hemosiderin Staining, Mottled, Pallor, Rubor. The surrounding wound skin color is noted with erythema which is circumferential. Periwound temperature was noted as No Abnormality. The periwound has tenderness on palpation. Wound #2 status is Open. Original cause of wound was Surgical Injury. The date acquired was: 01/03/2023. The wound has been in treatment 2 weeks. The wound is located on the Right,Medial Lower Leg. The wound measures 7.5cm length x 3.2cm width x 1.5cm depth;  18.85cm^2 area and 28.274cm^3 volume. There is Fat Layer (Subcutaneous Tissue) exposed. There is no tunneling or undermining noted. There is a medium amount of serosanguineous drainage noted. The wound margin is distinct with the outline attached to the wound base. There is medium (34-66%) red, friable, hyper - granulation within the wound bed. There is a medium (34-66%) amount of necrotic tissue within the wound bed including Adherent Slough. The periwound skin appearance exhibited: Scarring, Maceration, Erythema. The periwound skin appearance did not exhibit: Callus, Crepitus, Excoriation, Induration, Rash, Dry/Scaly, Atrophie Blanche, Cyanosis, Ecchymosis, Hemosiderin Staining,  Mottled, Pallor, Rubor. The surrounding wound skin color is noted with erythema which is circumferential. Periwound temperature was noted as No Abnormality. The periwound has tenderness on palpation. 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 The dehisced incision site proximal medial leg wound still has nonviable tissue throughout. I debrided nonviable tissue. No signs of soft tissue infection. He is still taking oral antibiotics. At this time we will start a compression wrap as swelling is a major deterrent in his healing. I recommended continuing Keystone and adding Hydrofera Blue. We will use Kerlix/Coban. He knows to call with any questions or concerns. He will have follow-up in 2 days to assure that he had no issues. He will need a wound VAC for the proximal medial right leg wound. We will go ahead and order this. Procedures Wound #1 Pre-procedure diagnosis of Wound #1 is an Arterial Insufficiency Ulcer located on the Right,Circumferential Lower Leg .Severity of Tissue Pre Debridement is: Fat layer exposed. There was a Excisional Skin/Subcutaneous Tissue Debridement with a total area of 30 sq cm performed by Kalman Shan, DO. With the following instrument(s): Curette to remove Viable and Non-Viable tissue/material. Material removed includes Subcutaneous Tissue, Slough, Skin: Dermis, and Skin: Epidermis after achieving pain control using Lidocaine 5% topical ointment. A time out was conducted at 08:40, prior to the start of the procedure. A Minimum amount of bleeding was controlled with Pressure. The procedure was tolerated well with a pain level of 0 throughout and a pain level of 3  following the procedure. Post Debridement Measurements: 5cm length x 13cm width x 0.2cm depth; 10.21cm^3 volume. Character of Wound/Ulcer Post Debridement requires further debridement. Severity of Tissue Post Debridement is: Fat layer exposed. Post procedure Diagnosis Wound #1: Same as Pre-Procedure Wound #2 Pre-procedure diagnosis of Wound #2 is a Dehisced Wound located on the Right,Medial Lower Leg . There was a Excisional Skin/Subcutaneous Tissue Debridement with a total area of 24 sq cm performed by Kalman Shan, DO. With the following instrument(s): Curette to remove Viable and Non-Viable tissue/material. Material removed includes Subcutaneous Tissue, Slough, and Biofilm after achieving pain control using Lidocaine 5% topical ointment. A time out was conducted at 08:40, prior to the start of the procedure. A Minimum amount of bleeding was controlled with Pressure. The procedure was tolerated well with a pain level of 0 throughout and a pain level of 3 following the procedure. Post Debridement Measurements: 7.5cm length x 3.2cm width x 1.5cm depth; 28.274cm^3 volume. Character of Wound/Ulcer Post Debridement requires further debridement. Post procedure Diagnosis Wound #2: Same as Pre-Procedure AMAAD, BYERS ([id number]) [phone].pdf Page 8 of 10 Plan Follow-up Appointments: Return Appointment in 1 week. - w/ Dr. Heber [place] next Thursday  02/01/23 @ 12:30 Rm # 9 w/ [facility] Return Appointment in 2 weeks. - w/ Dr. Heber [place] and Allayne Butcher Rm # 9 02/08/2023 0930 Other: - Continue oral antibiotics Bring in topical antibiotics to each appt time. Anesthetic: (In clinic) Topical Lidocaine 5% applied to wound bed Bathing/ Shower/ Hygiene: May shower with protection but do not get wound dressing(s) wet. Protect dressing(s) with water repellant cover (for example, large plastic bag) or a cast cover and may then take shower. - May wash wounds with dial antibacterial soap in the  shower if you immediately dress wound after shower. Negative Presssure Wound Therapy: Wound #2 Right,Medial Lower Leg: Wound Vac to wound continuously at 171m/hg pressure - Will order to be placed for next week- 2-3 times a week dressing change. [employer] will call you with cost of wound vac before shipping to your home. Black and White Foam combination Edema Control - Lymphedema / SCD / Other: Elevate legs to the level of the heart or above for 30 minutes daily and/or when sitting for 3-4 times a day throughout the day. Avoid standing for long periods of time. Home Health: Admit to Home Health for skilled nursing wound care. May utilize formulary equivalent dressing for wound treatment orders unless otherwise specified. New wound care orders this week; continue Home Health for wound care. May utilize formulary equivalent dressing for wound treatment orders unless otherwise specified. - both wounds topical compounding antibiotics hydrofera blue abd pad, kerlix and coban until wound vac arrives next week then apply it to right medial lower leg. Continue the topical antibiotics hydrofera blue to right circumferential lower leg kerlix coban. Dressing changes to be completed by [facility] Tuesday / Thursday / Saturday except when patient has scheduled [facility] Other Home Health Orders/Instructions: WOUND #1: - Lower Leg Wound Laterality: Right, Circumferential Cleanser: Soap and Water 3 x Per Week/30 Days Discharge Instructions: May shower and wash wound with dial antibacterial soap and water prior to dressing change. Cleanser: Wound Cleanser (Generic) 3 x Per Week/30 Days Discharge Instructions: Cleanse the wound with wound cleanser prior to applying a clean dressing using gauze sponges, not tissue or cotton balls. Prim Dressing: Hydrofera Blue Classic Foam, 4x4 in 3 x Per Week/30 Days ary Discharge Instructions: apply over the topical antibiotics. Prim Dressing:  Keystone topical compounding antibiotics 3 x Per Week/30 Days ary Discharge Instructions: applied directly to wound bed. Secondary Dressing: ABD Pad, 5x9 (Generic) 3 x Per Week/30 Days Discharge Instructions: Apply over primary dressing as directed. Secondary Dressing: Zetuvit Plus 4x8 in 3 x Per Week/30 Days Discharge Instructions: Apply over primary dressing as directed. Secured With: [employer] GroupSize 5, 10 (yds) (Generic) 3 x Per Week/30 Days Com pression Wrap: Kerlix Roll 4.5x3.1 (in/yd) 3 x Per Week/30 Days Discharge Instructions: Apply Kerlix and Coban compression as directed. Com pression Wrap: Coban Self-Adherent Wrap 4x5 (in/yd) 3 x Per Week/30 Days Discharge Instructions: Apply over Kerlix as directed. WOUND #2: - Lower Leg Wound Laterality: Right, Medial Cleanser: Soap and Water 3 x Per Week/30 Days Discharge Instructions: May shower and wash wound with dial antibacterial soap and water prior to dressing change. Cleanser: Wound Cleanser (Generic) 3 x Per Week/30 Days Discharge Instructions: Cleanse the wound with wound cleanser prior to applying a clean dressing using gauze sponges, not tissue or cotton balls. Prim Dressing: Hydrofera Blue Classic Foam, 4x4 in 3 x Per Week/30 Days ary Discharge Instructions: apply over the topical antibiotics. Prim Dressing: Keystone topical compounding antibiotics  3 x Per Week/30 Days ary Discharge Instructions: applied directly to wound bed. Prim Dressing: CONTINUE KEYSTONE TOPICAL ANTIBIOTICS AND HYDROFERA BLUE UNTIL THE WOUND VAC ARRIVES. ONCE ARRIVES APPLY ary WHITE AND BLACK FOAM TO THE MEDIAL LOWER LEG. 3 x Per Week/30 Days Secondary Dressing: ABD Pad, 5x9 (Generic) 3 x Per Week/30 Days Discharge Instructions: Apply over primary dressing as directed. Secondary Dressing: Zetuvit Plus 4x8 in 3 x Per Week/30 Days Discharge Instructions: Apply over primary dressing as directed. Secured With: [employer] Group Size 5, 10 (yds) (Generic) 3 x Per  Week/30 Days Com pression Wrap: Kerlix Roll 4.5x3.1 (in/yd) 3 x Per Week/30 Days Discharge Instructions: Apply Kerlix and Coban compression as directed. Com pression Wrap: Coban Self-Adherent Wrap 4x5 (in/yd) 3 x Per Week/30 Days Discharge Instructions: Apply over Kerlix as directed. 1. In office sharp debridement 2. Hydrofera Blue and Keystone antibiotic under Kerlix/Cobanooright lower extremity 3. Follow-up in 2 days. Electronic Signature(s) Signed: 01/30/2023 9:21:56 AM By: Kalman Shan DO Entered By: Kalman Shan on 01/30/2023 09:16:55 HxROS Details -------------------------------------------------------------------------------- Danford Bad ([id number]) [id number] Page 9 of 10 Patient Name: Date of Service: [name] 01/30/2023 7:45 A M Medical Record Number: [id number] Patient Account Number: [id number] Date of Birth/Sex: Treating RN: 1951/03/22 (72 y.o. M) Primary Care Provider: Maggie Font Other Clinician: Referring Provider: Treating Provider/Extender: Wilnette Kales in Treatment: 2 Information Obtained From Patient Chart Cardiovascular Medical History: Positive for: Hypertension; Peripheral Arterial Disease; Peripheral Venous Disease Past Medical History Notes: Hyperlipidemia Gastrointestinal Medical History: Past Medical History Notes: GERD Oncologic Medical History: Past Medical History Notes: prostate ca Immunizations Pneumococcal Vaccine: Received Pneumococcal Vaccination: Yes Received Pneumococcal Vaccination On or After 60th Birthday: Yes Implantable Devices None Hospitalization / Surgery History Type of Hospitalization/Surgery femoral-tibial bypass graft right 12/12/22 radical prostatectomy 2011 tonsillectomy age 67 Family and Social History Unknown History: Yes; Former smoker; Marital Status - Single; Alcohol Use: Never; Drug Use: No History; Caffeine Use: Rarely;  Financial Concerns: No; Food, Clothing or Shelter Needs: No; Support System Lacking: No; Transportation Concerns: No Electronic Signature(s) Signed: 01/30/2023 9:21:56 AM By: Kalman Shan DO Entered By: Kalman Shan on 01/30/2023 09:14:26 -------------------------------------------------------------------------------- SuperBill Details Patient Name: Date of Service: [address] 01/30/2023 Medical Record Number: [id number] Patient Account Number: [id number] Date of Birth/Sex: Treating RN: Feb 03, 1951 (72 y.o. 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 DONTREAL, MIERA ([id number]) [phone].pdf Page 10 of 10 S81.801A Unspecified open wound, right lower leg, initial encounter T81.31XA Disruption of external operation (surgical) wound, not elsewhere classified, initial encounter Facility Procedures : CPT4 Code: [id number] Description: 11042 - DEB SUBQ TISSUE 20 SQ CM/< ICD-10 Diagnosis Description S81.801A Unspecified open wound, right lower leg, initial encounter T81.31XA Disruption of external operation (surgical) wound, not elsewhere classified, init I70.238  Atherosclerosis of native arteries of right leg with ulceration of other part of Modifier: ial encounter lower leg Quantity: 1 : CPT4 Code: [id number] Description: 68127 - DEB SUBQ TISS EA ADDL 20CM ICD-10 Diagnosis Description S81.801A Unspecified open wound, right lower leg, initial encounter T81.31XA Disruption of external operation (surgical) wound, not elsewhere classified, init I70.238  Atherosclerosis of native arteries of right leg with ulceration of other part of Modifier: ial encounter lower leg Quantity: 2 Physician  Procedures :  CPT4 Code Description Modifier [id number] 11042 - WC PHYS SUBQ TISS 20 SQ CM ICD-10 Diagnosis Description S81.801A Unspecified open wound, right lower leg, initial encounter T81.31XA Disruption of external operation (surgical) wound, not elsewhere  classified, initial encounter I70.238 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

## 2023-01-31 NOTE — Progress Notes (Signed)
WIL, SLAPE ([id number]) [phone].pdf Page 1 of 9 Visit Report for 01/30/2023 Arrival Information Details Patient Name: Date of Service: [name] 01/30/2023 7:45 A M Medical Record Number: [id number] Patient Account Number: [id number] Date of Birth/Sex: Treating RN: [birth year] (72 y.o. [name] Primary Care Nakya Weyand: Maggie Font Other Clinician: Referring Kenzleigh Sedam: Treating Iver Miklas/Extender: Wilnette Kales in Treatment: 2 Visit Information History Since Last Visit Added or deleted any medications: No Patient Arrived: [name] Any new allergies or adverse reactions: No Arrival Time: 07:56 Had a fall or experienced change in No Accompanied By: self activities of daily living that may affect Transfer Assistance: Manual risk of falls: Patient Identification Verified: Yes Signs or symptoms of abuse/neglect since last visito No Patient Requires Transmission-Based Precautions: No Hospitalized since last visit: No Patient Has Alerts: No Implantable device outside of the clinic excluding No cellular tissue based products placed in the center since last visit: Has Dressing in Place as Prescribed: Yes Has Compression in Place as Prescribed: Yes Pain Present Now: Yes Electronic Signature(s) Signed: 01/30/2023 9:28:40 AM By: Dellie Catholic RN Entered By: Dellie Catholic on 01/30/2023 07:57:17 -------------------------------------------------------------------------------- Encounter Discharge Information Details Patient Name: Date of Service: [address] Lewis. 01/30/2023 7:45 A M Medical Record Number: [id number] Patient Account Number: [id number] Date of Birth/Sex: Treating RN: [birth year] (72 y.o. [name] Primary Care Makayela Secrest: Maggie Font Other Clinician: Referring Genelle Economou: Treating Fintan Grater/Extender: Wilnette Kales in Treatment: 2 Encounter Discharge Information  Items Post Procedure Vitals Discharge Condition: Stable Temperature (F): 98.7 Ambulatory Status: Ambulatory Pulse (bpm): 71 Discharge Destination: Home Respiratory Rate (breaths/min): 20 Transportation: Private Auto Blood Pressure (mmHg): 157/68 Accompanied By: self Schedule Follow-up Appointment: Yes Clinical Summary of Care: Electronic Signature(s) Signed: 01/30/2023 6:17:04 PM By: Deon Pilling RN, BSN Entered By: Deon Pilling on 01/30/2023 09:30:47 Danford Bad ([id number]) [id number].pdf Page 2 of 9 -------------------------------------------------------------------------------- Lower Extremity Assessment Details Patient Name: Date of Service: [name] 01/30/2023 7:45 A M Medical Record Number: [id number] Patient Account Number: [id number] Date of Birth/Sex: Treating RN: December 27, 1950 (72 y.o. [name] Primary Care Kyomi Hector: Maggie Font Other Clinician: Referring Shameika Speelman: Treating Augustine Leverette/Extender: Patrecia Pour Weeks in Treatment: 2 Edema Assessment Assessed: [Left: No] [Right: No] Edema: [Left: Ye] [Right: s] Calf Left: Right: Point of Measurement: 42 cm From Medial Instep 44.5 cm Ankle Left: Right: Point of Measurement: 9 cm From Medial Instep 27.5 cm Vascular Assessment Pulses: Dorsalis Pedis Palpable: [Right:Yes] Electronic Signature(s) Signed: 01/30/2023 9:28:40 AM By: Dellie Catholic RN Entered By: Dellie Catholic on 01/30/2023 08:08:51 -------------------------------------------------------------------------------- Multi Wound Chart Details Patient Name: Date of Service: Vincent Eng Lewis. 01/30/2023 7:45 A M Medical Record Number: [id number] Patient Account Number: [id number] Date of Birth/Sex: Treating RN: 1951/04/09 (72 y.o. M) Primary Care Teniya Filter: Maggie Font Other Clinician: Referring Nilza Eaker: Treating Tannor Pyon/Extender: Wilnette Kales in  Treatment: 2 Vital Signs Height(in): Pulse(bpm): 71 Weight(lbs): Blood Pressure(mmHg): 157/68 Body Mass Index(BMI): Temperature(F): 98.7 Respiratory Rate(breaths/min): 20 [1:Photos:] [N/A:N/A [phone].pdf Page 3 of 9] Right, Circumferential Lower Leg Right, Medial Lower Leg N/A Wound Location: Trauma Surgical Injury N/A Wounding Event: Arterial Insufficiency Ulcer Dehisced Wound N/A Primary Etiology: Hypertension, Peripheral Arterial Hypertension, Peripheral Arterial N/A Comorbid History: Disease, Peripheral Venous Disease Disease, Peripheral Venous Disease 09/13/2022 01/03/2023 N/A Date Acquired: 2 2 N/A Weeks of Treatment: Open Open N/A Wound Status: No No N/A Wound  Recurrence: 5x13x0.2 7.5x3.2x1.5 N/A Measurements L x W x D (cm) 51.051 18.85 N/A A (cm) : rea 10.21 28.274 N/A Volume (cm) : 72.20% -101.70% N/A % Reduction in A rea: 72.20% -656.40% N/A % Reduction in Volume: Full Thickness With Exposed Support Full Thickness With Exposed Support N/A Classification: Structures Structures Large Medium N/A Exudate A mount: Serosanguineous Serosanguineous N/A Exudate Type: red, brown red, brown N/A Exudate Color: Distinct, outline attached Distinct, outline attached N/A Wound Margin: Small (1-33%) Medium (34-66%) N/A Granulation A mount: Red, Pink Red, Hyper-granulation, Friable N/A Granulation Quality: Large (67-100%) Medium (34-66%) N/A Necrotic A mount: Fat Layer (Subcutaneous Tissue): Yes Fat Layer (Subcutaneous Tissue): Yes N/A Exposed Structures: Fascia: No Fascia: No Tendon: No Tendon: No Muscle: No Muscle: No Joint: No Joint: No Bone: No Bone: No None Small (1-33%) N/A Epithelialization: Debridement - Excisional Debridement - Excisional N/A Debridement: Pre-procedure Verification/Time Out 08:40 08:40 N/A Taken: Lidocaine 5% topical ointment Lidocaine 5% topical ointment N/A Pain Control: Subcutaneous, Slough  Subcutaneous, Slough N/A Tissue Debrided: Skin/Subcutaneous Tissue Skin/Subcutaneous Tissue N/A Level: 30 24 N/A Debridement A (sq cm): rea Curette Curette N/A Instrument: Minimum Minimum N/A Bleeding: Pressure Pressure N/A Hemostasis A chieved: 0 0 N/A Procedural Pain: 3 3 N/A Post Procedural Pain: Procedure was tolerated well Procedure was tolerated well N/A Debridement Treatment Response: 5x13x0.2 7.5x3.2x1.5 N/A Post Debridement Measurements L x W x D (cm) 10.21 28.274 N/A Post Debridement Volume: (cm) Scarring: Yes Scarring: Yes N/A Periwound Skin Texture: Excoriation: No Excoriation: No Induration: No Induration: No Callus: No Callus: No Crepitus: No Crepitus: No Rash: No Rash: No Maceration: No Maceration: Yes N/A Periwound Skin Moisture: Dry/Scaly: No Dry/Scaly: No Erythema: Yes Erythema: Yes N/A Periwound Skin Color: Atrophie Blanche: No Atrophie Blanche: No Cyanosis: No Cyanosis: No Ecchymosis: No Ecchymosis: No Hemosiderin Staining: No Hemosiderin Staining: No Mottled: No Mottled: No Pallor: No Pallor: No Rubor: No Rubor: No Circumferential Circumferential N/A Erythema Location: No Abnormality No Abnormality N/A Temperature: Yes Yes N/A Tenderness on Palpation: Debridement Debridement N/A Procedures Performed: Treatment Notes Electronic Signature(s) Signed: 01/30/2023 9:21:56 AM By: Kalman Shan DO Entered By: Kalman Shan on 01/30/2023 09:13:05 Danford Bad ([id number]) [id number].pdf Page 4 of 9 -------------------------------------------------------------------------------- Multi-Disciplinary Care Plan Details Patient Name: Date of Service: [name] 01/30/2023 7:45 A M Medical Record Number: [id number] Patient Account Number: [id number] Date of Birth/Sex: Treating RN: 04/06/1951 (72 y.o. [name] Primary Care Naveena Eyman: Maggie Font Other Clinician: Referring  Ashaya Raftery: Treating Meer Reindl/Extender: Wilnette Kales in Treatment: 2 Active Inactive Wound/Skin Impairment Nursing Diagnoses: Impaired tissue integrity Knowledge deficit related to ulceration/compromised skin integrity Goals: Patient will have a decrease in wound volume by X% from date: (specify in notes) Date Initiated: 01/11/2023 Target Resolution Date: 02/23/2023 Goal Status: Active Patient/caregiver will verbalize understanding of skin care regimen Date Initiated: 01/11/2023 Date Inactivated: 01/30/2023 Target Resolution Date: 02/03/2023 Goal Status: Met Ulcer/skin breakdown will have a volume reduction of 30% by week 4 Date Initiated: 01/11/2023 Target Resolution Date: 02/15/2023 Goal Status: Active Interventions: Assess patient/caregiver ability to obtain necessary supplies Assess patient/caregiver ability to perform ulcer/skin care regimen upon admission and as needed Assess ulceration(s) every visit Notes: Electronic Signature(s) Signed: 01/30/2023 6:17:04 PM By: Deon Pilling RN, BSN Entered By: Deon Pilling on 01/30/2023 08:31:39 -------------------------------------------------------------------------------- Pain Assessment Details Patient Name: Date of Service: Vincent Eng Lewis. 01/30/2023 7:45 A M Medical Record Number: [id number] Patient Account Number: [id number] Date of Birth/Sex: Treating RN: Jul 17, 1951 (71  y.o. [name] Primary Care Lovelee Forner: Maggie Font Other Clinician: Referring Dunya Meiners: Treating Shep Porter/Extender: Wilnette Kales in Treatment: 2 Active Problems Location of Pain Severity and Description of Pain Patient Has Paino Yes Site Locations Pain Location: [name], [name] ([id number]) [id number].pdf Page 5 of 9 Pain Location: Generalized Pain, Pain in Ulcers Rate the pain. Worst Pain Level: 5 Pain Management and Medication Current Pain Management: Medication:  No Cold Application: No Rest: No Massage: No Activity: No T.E.N.S.: No Heat Application: No Leg drop or elevation: No Is the Current Pain Management Adequate: Adequate How does your wound impact your activities of daily livingo Sleep: No Bathing: No Appetite: No Relationship With Others: No Bladder Continence: No Emotions: No Bowel Continence: No Work: No Toileting: No Drive: No Dressing: No Hobbies: No Electronic Signature(s) Signed: 01/30/2023 9:28:40 AM By: Dellie Catholic RN Entered By: Dellie Catholic on 01/30/2023 07:59:19 -------------------------------------------------------------------------------- Patient/Caregiver Education Details Patient Name: Date of Service: [name] 2/6/2024andnbsp7:45 A M Medical Record Number: [id number] Patient Account Number: [id number] Date of Birth/Gender: Treating RN: [birth year] (72 y.o. [name] Primary Care Physician: Maggie Font Other Clinician: Referring Physician: Treating Physician/Extender: Wilnette Kales in Treatment: 2 Education Assessment Education Provided To: Patient Education Topics Provided Wound/Skin Impairment: Handouts: Caring for Your Ulcer Methods: Explain/Verbal Responses: Reinforcements needed Electronic Signature(s) Signed: 01/30/2023 6:17:04 PM By: Deon Pilling RN, BSN Entered By: Deon Pilling on 01/30/2023 08:31:53 Danford Bad ([id number]) [id number].pdf Page 6 of 9 -------------------------------------------------------------------------------- Wound Assessment Details Patient Name: Date of Service: [name] 01/30/2023 7:45 A M Medical Record Number: [id number] Patient Account Number: [id number] Date of Birth/Sex: Treating RN: 1951/04/26 (72 y.o. [name] Primary Care Iara Monds: Maggie Font Other Clinician: Referring Corbitt Cloke: Treating Neale Marzette/Extender: Patrecia Pour Weeks  in Treatment: 2 Wound Status Wound Number: 1 Primary Arterial Insufficiency Ulcer Etiology: Wound Location: Right, Circumferential Lower Leg Wound Status: Open Wounding Event: Trauma Comorbid Hypertension, Peripheral Arterial Disease, Peripheral Venous Date Acquired: 09/13/2022 History: Disease Weeks Of Treatment: 2 Clustered Wound: No Photos Wound Measurements Length: (cm) 5 Width: (cm) 13 Depth: (cm) 0.2 Area: (cm) 51.051 Volume: (cm) 10.21 % Reduction in Area: 72.2% % Reduction in Volume: 72.2% Epithelialization: None Tunneling: No Undermining: No Wound Description Classification: Full Thickness With Exposed Suppor Wound Margin: Distinct, outline attached Exudate Amount: Large Exudate Type: Serosanguineous Exudate Color: red, brown t Structures Foul Odor After Cleansing: No Slough/Fibrino Yes Wound Bed Granulation Amount: Small (1-33%) Exposed Structure Granulation Quality: Red, Pink Fascia Exposed: No Necrotic Amount: Large (67-100%) Fat Layer (Subcutaneous Tissue) Exposed: Yes Necrotic Quality: Adherent Slough Tendon Exposed: No Muscle Exposed: No Joint Exposed: No Bone Exposed: No Periwound Skin Texture Texture Color No Abnormalities Noted: No No Abnormalities Noted: No Callus: No Atrophie Blanche: No Crepitus: No Cyanosis: No Excoriation: No Ecchymosis: No Induration: No Erythema: Yes Rash: No Erythema Location: Circumferential Scarring: Yes Hemosiderin Staining: No Mottled: No Moisture Pallor: No No Abnormalities Noted: Yes Rubor: No Temperature / Pain [name], [name] ([id number]) [id number].pdf Page 7 of 9 Temperature: No Abnormality Tenderness on Palpation: Yes Treatment Notes Wound #1 (Lower Leg) Wound Laterality: Right, Circumferential Cleanser Soap and Water Discharge Instruction: May shower and wash wound with dial antibacterial soap and water prior to dressing change. Wound Cleanser Discharge  Instruction: Cleanse the wound with wound cleanser prior to applying a clean dressing using gauze sponges, not tissue or cotton balls. Peri-Wound Care Topical  Primary Dressing Hydrofera Blue Classic Foam, 4x4 in Discharge Instruction: apply over the topical antibiotics. Keystone topical compounding antibiotics Discharge Instruction: applied directly to wound bed. Secondary Dressing ABD Pad, 5x9 Discharge Instruction: Apply over primary dressing as directed. Zetuvit Plus 4x8 in Discharge Instruction: Apply over primary dressing as directed. Secured With [employer] Size 5, 10 (yds) Compression Wrap Kerlix Roll 4.5x3.1 (in/yd) Discharge Instruction: Apply Kerlix and Coban compression as directed. Coban Self-Adherent Wrap 4x5 (in/yd) Discharge Instruction: Apply over Kerlix as directed. Compression Stockings Add-Ons Electronic Signature(s) Signed: 01/30/2023 9:28:40 AM By: Dellie Catholic RN Signed: 01/30/2023 6:17:04 PM By: Deon Pilling RN, BSN Entered By: Deon Pilling on 01/30/2023 08:10:17 -------------------------------------------------------------------------------- Wound Assessment Details Patient Name: Date of Service: Vincent Eng Lewis. 01/30/2023 7:45 A M Medical Record Number: [id number] Patient Account Number: [id number] Date of Birth/Sex: Treating RN: 1950/12/28 (72 y.o. [name] Primary Care Yacoub Diltz: Maggie Font Other Clinician: Referring Bryer Cozzolino: Treating Skylan Lara/Extender: Patrecia Pour Weeks in Treatment: 2 Wound Status Wound Number: 2 Primary Dehisced Wound Etiology: Wound Location: Right, Medial Lower Leg Wound Status: Open Wounding Event: Surgical Injury Comorbid Hypertension, Peripheral Arterial Disease, Peripheral Venous Date Acquired: 01/03/2023 History: Disease Weeks Of Treatment: 2 Clustered Wound: No Photos [name], [name] ([id number]) [phone].pdf Page 8 of 9 Wound  Measurements Length: (cm) 7.5 Width: (cm) 3.2 Depth: (cm) 1.5 Area: (cm) 18.85 Volume: (cm) 28.274 % Reduction in Area: -101.7% % Reduction in Volume: -656.4% Epithelialization: Small (1-33%) Tunneling: No Undermining: No Wound Description Classification: Full Thickness With Exposed Support Structures Wound Margin: Distinct, outline attached Exudate Amount: Medium Exudate Type: Serosanguineous Exudate Color: red, brown Foul Odor After Cleansing: No Slough/Fibrino Yes Wound Bed Granulation Amount: Medium (34-66%) Exposed Structure Granulation Quality: Red, Hyper-granulation, Friable Fascia Exposed: No Necrotic Amount: Medium (34-66%) Fat Layer (Subcutaneous Tissue) Exposed: Yes Necrotic Quality: Adherent Slough Tendon Exposed: No Muscle Exposed: No Joint Exposed: No Bone Exposed: No Periwound Skin Texture Texture Color No Abnormalities Noted: No No Abnormalities Noted: No Callus: No Atrophie Blanche: No Crepitus: No Cyanosis: No Excoriation: No Ecchymosis: No Induration: No Erythema: Yes Rash: No Erythema Location: Circumferential Scarring: Yes Hemosiderin Staining: No Mottled: No Moisture Pallor: No No Abnormalities Noted: No Rubor: No Dry / Scaly: No Maceration: Yes Temperature / Pain Temperature: No Abnormality Tenderness on Palpation: Yes Treatment Notes Wound #2 (Lower Leg) Wound Laterality: Right, Medial Cleanser Soap and Water Discharge Instruction: May shower and wash wound with dial antibacterial soap and water prior to dressing change. Wound Cleanser Discharge Instruction: Cleanse the wound with wound cleanser prior to applying a clean dressing using gauze sponges, not tissue or cotton balls. Peri-Wound Care Topical Primary Dressing Hydrofera Blue Classic Foam, 4x4 in Discharge Instruction: apply over the topical antibiotics. Keystone topical compounding antibiotics Discharge Instruction: applied directly to wound bed. CONTINUE KEYSTONE  TOPICAL ANTIBIOTICS AND HYDROFERA BLUE UNTIL THE WOUND VAC ARRIVES. ONCE ARRIVES APPLY WHITE AND BLACK FOAM TO THE MEDIAL LOWER LEG. [name], [name] ([id number]) [phone].pdf Page 9 of 9 Secondary Dressing ABD Pad, 5x9 Discharge Instruction: Apply over primary dressing as directed. Zetuvit Plus 4x8 in Discharge Instruction: Apply over primary dressing as directed. Secured With [employer] Size 5, 10 (yds) Compression Wrap Kerlix Roll 4.5x3.1 (in/yd) Discharge Instruction: Apply Kerlix and Coban compression as directed. Coban Self-Adherent Wrap 4x5 (in/yd) Discharge Instruction: Apply over Kerlix as directed. Compression Stockings Add-Ons Electronic Signature(s) Signed: 01/30/2023 9:28:40 AM By: Dellie Catholic RN Signed: 01/30/2023 6:17:04 PM By: Deon Pilling RN, BSN  Entered By: Deon Pilling on 01/30/2023 08:10:47 -------------------------------------------------------------------------------- Vitals Details Patient Name: Date of Service: [name]. 01/30/2023 7:45 A M Medical Record Number: [id number] Patient Account Number: [id number] Date of Birth/Sex: Treating RN: [birth year] (72 y.o. [name] Primary Care Porfiria Heinrich: Maggie Font Other Clinician: Referring Alani Sabbagh: Treating Rivaldo Hineman/Extender: Wilnette Kales in Treatment: 2 Vital Signs Time Taken: 07:59 Temperature (F): 98.7 Pulse (bpm): 71 Respiratory Rate (breaths/min): 20 Blood Pressure (mmHg): 157/68 Reference Range: 80 - 120 mg / dl Electronic Signature(s) Signed: 01/30/2023 9:28:40 AM By: Dellie Catholic RN Entered By: Dellie Catholic on 01/30/2023 07:59:03

## 2023-02-01 ENCOUNTER — Encounter (HOSPITAL_BASED_OUTPATIENT_CLINIC_OR_DEPARTMENT_OTHER): Payer: Medicare Other | Admitting: Internal Medicine

## 2023-02-01 DIAGNOSIS — I70238 Atherosclerosis of native arteries of right leg with ulceration of other part of lower right leg: Secondary | ICD-10-CM

## 2023-02-01 DIAGNOSIS — T8131XA Disruption of external operation (surgical) wound, not elsewhere classified, initial encounter: Secondary | ICD-10-CM

## 2023-02-01 DIAGNOSIS — L97812 Non-pressure chronic ulcer of other part of right lower leg with fat layer exposed: Secondary | ICD-10-CM | POA: Diagnosis not present

## 2023-02-01 DIAGNOSIS — L97815 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 without evidence of necrosis: Secondary | ICD-10-CM | POA: Diagnosis not present

## 2023-02-01 DIAGNOSIS — I87311 Chronic venous hypertension (idiopathic) with ulcer of right lower extremity: Secondary | ICD-10-CM | POA: Diagnosis not present

## 2023-02-02 NOTE — Progress Notes (Signed)
LOY, MARETT ([id number]) [id number] Page 1 of 10 Visit Report for 02/01/2023 Arrival Information Details Patient Name: Date of Service: [name] 02/01/2023 12:30 PM Medical Record Number: [id number] Patient Account Number: [id number] Date of Birth/Sex: Treating RN: 1951-09-20 (72 y.o. [name] Primary Care Ritesh Opara: Maggie Font Other Clinician: Referring Jibri Schriefer: Treating Sande Pickert/Extender: Wilnette Kales in Treatment: 3 Visit Information History Since Last Visit Added or deleted any medications: No Patient Arrived: Kasandra Knudsen Any new allergies or adverse reactions: No Arrival Time: 12:47 Had a fall or experienced change in No Accompanied By: self activities of daily living that may affect Transfer Assistance: None risk of falls: Patient Identification Verified: Yes Signs or symptoms of abuse/neglect since last visito No Secondary Verification Process Completed: Yes Hospitalized since last visit: No Patient Requires Transmission-Based Precautions: No Implantable device outside of the clinic excluding No Patient Has Alerts: No cellular tissue based products placed in the center since last visit: Has Dressing in Place as Prescribed: Yes Has Compression in Place as Prescribed: Yes Pain Present Now: Yes Notes patient waiting to hear about wound vac approval and arrival from [place]. Updated patient on wound vac and home health. Electronic Signature(s) Signed: 02/01/2023 5:24:12 PM By: Deon Pilling RN, BSN Entered By: Deon Pilling on 02/01/2023 12:48:52 -------------------------------------------------------------------------------- Encounter Discharge Information Details Patient Name: Date of Service: [address] Lewis. 02/01/2023 12:30 PM Medical Record Number: [id number] Patient Account Number: [id number] Date of Birth/Sex: Treating RN: 1951-09-30 (72 y.o. [name] Primary Care Sallee Hogrefe:  Maggie Font Other Clinician: Referring Deep Bonawitz: Treating Candance Bohlman/Extender: Wilnette Kales in Treatment: 3 Encounter Discharge Information Items Post Procedure Vitals Discharge Condition: Stable Temperature (F): 98.7 Ambulatory Status: Cane Pulse (bpm): 56 Discharge Destination: Home Respiratory Rate (breaths/min): 20 Transportation: Private Auto Blood Pressure (mmHg): 119/56 Accompanied By: self Schedule Follow-up Appointment: Yes Clinical Summary of Care: Electronic Signature(s) Signed: 02/01/2023 5:24:12 PM By: Deon Pilling RN, BSN Entered By: Deon Pilling on 02/01/2023 13:36:41 [facility] [place] ([id number]) [id number] Page 2 of 10 -------------------------------------------------------------------------------- Lower Extremity Assessment Details Patient Name: Date of Service: [name] 02/01/2023 12:30 PM Medical Record Number: [id number] Patient Account Number: [id number] Date of Birth/Sex: Treating RN: Jul 20, 1951 (72 y.o. [name] Primary Care Jakaila Norment: Maggie Font Other Clinician: Referring Burnie Therien: Treating Kayson Bullis/Extender: Wilnette Kales in Treatment: 3 Edema Assessment Assessed: [Left: No] [Right: Yes] Edema: [Left: Ye] [Right: s] Calf Left: Right: Point of Measurement: 42 cm From Medial Instep 43 cm Ankle Left: Right: Point of Measurement: 9 cm From Medial Instep 27 cm Vascular Assessment Pulses: Dorsalis Pedis Palpable: [Right:Yes] Electronic Signature(s) Signed: 02/01/2023 5:24:12 PM By: Deon Pilling RN, BSN Entered By: Deon Pilling on 02/01/2023 12:52:15 -------------------------------------------------------------------------------- Multi Wound Chart Details Patient Name: Date of Service: Vincent Eng Lewis. 02/01/2023 12:30 PM Medical Record Number: [id number] Patient Account Number: [id number] Date of Birth/Sex: Treating RN: [birth year] (72  y.o. M) Primary Care Annebelle Bostic: Maggie Font Other Clinician: Referring Mithra Spano: Treating Camiya Vinal/Extender: Wilnette Kales in Treatment: 3 Vital Signs Height(in): Pulse(bpm): 56 Weight(lbs): Blood Pressure(mmHg): 119/69 Body Mass Index(BMI): Temperature(F): 98.7 Respiratory Rate(breaths/min): 20 [1:Photos:] [N/A:N/A [id number] Page 3 of 10] Right, Anterior Lower Leg Right, Medial Lower Leg N/A Wound Location: Trauma Surgical Injury N/A Wounding Event: Arterial Insufficiency Ulcer Dehisced Wound N/A Primary Etiology: Hypertension, Peripheral Arterial Hypertension, Peripheral Arterial N/A Comorbid History: Disease, Peripheral Venous Disease  Disease, Peripheral Venous Disease 09/13/2022 01/03/2023 N/A Date Acquired: 3 3 N/A Weeks of Treatment: Open Open N/A Wound Status: No No N/A Wound Recurrence: 5x6.5x0.2 7.2x3.6x2.3 N/A Measurements L x W x D (cm) 25.525 20.358 N/A A (cm) : rea 5.105 46.822 N/A Volume (cm) : 86.10% -117.80% N/A % Reduction in A rea: 86.10% -1152.60% N/A % Reduction in Volume: Full Thickness With Exposed Support Full Thickness With Exposed Support N/A Classification: Structures Structures Large Medium N/A Exudate A mount: Serosanguineous Serosanguineous N/A Exudate Type: red, brown red, brown N/A Exudate Color: Distinct, outline attached Distinct, outline attached N/A Wound Margin: Medium (34-66%) Medium (34-66%) N/A Granulation A mount: Red, Pink Red, Hyper-granulation, Friable N/A Granulation Quality: Medium (34-66%) Medium (34-66%) N/A Necrotic A mount: Fat Layer (Subcutaneous Tissue): Yes Fat Layer (Subcutaneous Tissue): Yes N/A Exposed Structures: Fascia: No Fascia: No Tendon: No Tendon: No Muscle: No Muscle: No Joint: No Joint: No Bone: No Bone: No Small (1-33%) Small (1-33%) N/A Epithelialization: Debridement - Excisional Debridement - Excisional  N/A Debridement: Pre-procedure Verification/Time Out 13:20 13:20 N/A Taken: Lidocaine 4% Topical Solution Lidocaine 4% Topical Solution N/A Pain Control: Subcutaneous, Slough Muscle, Subcutaneous, Slough N/A Tissue Debrided: Skin/Subcutaneous Tissue Skin/Subcutaneous Tissue/Muscle N/A Level: 32.5 25.92 N/A Debridement A (sq cm): rea Curette Curette N/A Instrument: Minimum Minimum N/A Bleeding: Pressure Pressure N/A Hemostasis A chieved: 0 0 N/A Procedural Pain: 2 2 N/A Post Procedural Pain: Procedure was tolerated well Procedure was tolerated well N/A Debridement Treatment Response: 5x6.5x0.2 7.2x3.6x2.3 N/A Post Debridement Measurements L x W x D (cm) 5.105 46.822 N/A Post Debridement Volume: (cm) Scarring: Yes Scarring: Yes N/A Periwound Skin Texture: Excoriation: No Excoriation: No Induration: No Induration: No Callus: No Callus: No Crepitus: No Crepitus: No Rash: No Rash: No Maceration: No Maceration: Yes N/A Periwound Skin Moisture: Dry/Scaly: No Dry/Scaly: No Erythema: Yes Erythema: Yes N/A Periwound Skin Color: Atrophie Blanche: No Atrophie Blanche: No Cyanosis: No Cyanosis: No Ecchymosis: No Ecchymosis: No Hemosiderin Staining: No Hemosiderin Staining: No Mottled: No Mottled: No Pallor: No Pallor: No Rubor: No Rubor: No Circumferential Circumferential N/A Erythema Location: No Abnormality No Abnormality N/A Temperature: Yes Yes N/A Tenderness on Palpation: Debridement Debridement N/A Procedures Performed: Treatment Notes Wound #1 (Lower Leg) Wound Laterality: Right, Anterior Cleanser Soap and Water Discharge Instruction: May shower and wash wound with dial antibacterial soap and water prior to dressing change. Wound Cleanser Discharge Instruction: Cleanse the wound with wound cleanser prior to applying a clean dressing using gauze sponges, not tissue or cotton balls. Peri-Wound Care Topical Primary Dressing Hydrofera Blue  Classic Foam, 4x4 in Discharge Instruction: apply over the topical antibiotics. TAI, HENNESS ([id number]) [id number] Page 4 of 10 Sorbalgon [employer], 4x4 (in/in) Discharge Instruction: *****APPLY TO ANY WEEPING AREAS TO RIGHT LOWER LEG.***** Keystone topical compounding antibiotics Discharge Instruction: applied directly to wound bed. Secondary Dressing ABD Pad, 5x9 Discharge Instruction: Apply over primary dressing as directed. Zetuvit Plus 4x8 in Discharge Instruction: Apply over primary dressing as directed. Secured With [employer] Size 5, 10 (yds) Compression Wrap Kerlix Roll 4.5x3.1 (in/yd) Discharge Instruction: Apply Kerlix and Coban compression as directed. Coban Self-Adherent Wrap 4x5 (in/yd) Discharge Instruction: Apply over Kerlix as directed. Compression Stockings Add-Ons Wound #2 (Lower Leg) Wound Laterality: Right, Medial Cleanser Soap and Water Discharge Instruction: May shower and wash wound with dial antibacterial soap and water prior to dressing change. Wound Cleanser Discharge Instruction: Cleanse the wound with wound cleanser prior to applying a clean dressing using gauze sponges, not tissue or cotton balls.  Peri-Wound Care Topical Primary Dressing Hydrofera Blue Classic Foam, 4x4 in Discharge Instruction: apply over the topical antibiotics. PACK INTO WOUND BED, UNDERMINING AND TUNNELING. Keystone topical compounding antibiotics Discharge Instruction: applied directly to wound bed. CONTINUE KEYSTONE TOPICAL ANTIBIOTICS AND HYDROFERA BLUE UNTIL THE WOUND VAC ARRIVES. ONCE ARRIVES APPLY WHITE AND BLACK FOAM TO THE MEDIAL LOWER LEG. Secondary Dressing ABD Pad, 5x9 Discharge Instruction: Apply over primary dressing as directed. Woven Gauze Sponge, Non-Sterile 4x4 in Discharge Instruction: APPLY OVER THE HYDROFERA BLUE TO ENSURE CONTACT WITH WOUND BED. Zetuvit Plus 4x8 in Discharge Instruction: Apply over primary dressing  as directed. Secured With [employer] Size 5, 10 (yds) Compression Wrap Kerlix Roll 4.5x3.1 (in/yd) Discharge Instruction: Apply Kerlix and Coban compression as directed. Coban Self-Adherent Wrap 4x5 (in/yd) Discharge Instruction: Apply over Kerlix as directed. Compression Stockings Add-Ons Electronic Signature(s) Signed: 02/01/2023 3:04:17 PM By: Kalman Shan DO Entered By: Kalman Shan on 02/01/2023 13:46:16 Ponce, [place] Lewis ([id number]) [id number] Page 5 of 10 -------------------------------------------------------------------------------- Multi-Disciplinary Care Plan Details Patient Name: Date of Service: [name] 02/01/2023 12:30 PM Medical Record Number: [id number] Patient Account Number: [id number] Date of Birth/Sex: Treating RN: 06/21/1951 (72 y.o. [name] Primary Care Taylour Lietzke: Maggie Font Other Clinician: Referring Chaden Doom: Treating Silvanna Ohmer/Extender: Wilnette Kales in Treatment: 3 Active Inactive Wound/Skin Impairment Nursing Diagnoses: Impaired tissue integrity Knowledge deficit related to ulceration/compromised skin integrity Goals: Patient will have a decrease in wound volume by X% from date: (specify in notes) Date Initiated: 01/11/2023 Target Resolution Date: 03/23/2023 Goal Status: Active Patient/caregiver will verbalize understanding of skin care regimen Date Initiated: 01/11/2023 Date Inactivated: 01/30/2023 Target Resolution Date: 02/03/2023 Goal Status: Met Ulcer/skin breakdown will have a volume reduction of 30% by week 4 Date Initiated: 01/11/2023 Target Resolution Date: 02/15/2023 Goal Status: Active Interventions: Assess patient/caregiver ability to obtain necessary supplies Assess patient/caregiver ability to perform ulcer/skin care regimen upon admission and as needed Assess ulceration(s) every visit Notes: Electronic Signature(s) Signed: 02/01/2023 5:24:12 PM By:  Deon Pilling RN, BSN Entered By: Deon Pilling on 02/01/2023 13:35:10 -------------------------------------------------------------------------------- Pain Assessment Details Patient Name: Date of Service: Vincent Eng Lewis. 02/01/2023 12:30 PM Medical Record Number: [id number] Patient Account Number: [id number] Date of Birth/Sex: Treating RN: 1951/05/22 (72 y.o. [name] Primary Care Yesika Rispoli: Maggie Font Other Clinician: Referring Damarious Holtsclaw: Treating Marrah Vanevery/Extender: Wilnette Kales in Treatment: 3 Active Problems Location of Pain Severity and Description of Pain Patient Has Paino Yes Site Locations Pain LocationKENYATA, [name] ([id number]) [phone].pdf Page 6 of 10 Pain Location: Generalized Pain, Pain in Ulcers Rate the pain. Current Pain Level: 5 Pain Management and Medication Current Pain Management: Medication: No Cold Application: No Rest: No Massage: No Activity: No T.E.N.S.: No Heat Application: No Leg drop or elevation: No Is the Current Pain Management Adequate: Adequate How does your wound impact your activities of daily livingo Sleep: No Bathing: No Appetite: No Relationship With Others: No Bladder Continence: No Emotions: No Bowel Continence: No Work: No Toileting: No Drive: No Dressing: No Hobbies: No Engineer, maintenance) Signed: 02/01/2023 5:24:12 PM By: Deon Pilling RN, BSN Entered By: Deon Pilling on 02/01/2023 12:49:14 -------------------------------------------------------------------------------- Patient/Caregiver Education Details Patient Name: Date of Service: [name] 2/8/2024andnbsp12:30 PM Medical Record Number: [id number] Patient Account Number: [id number] Date of Birth/Gender: Treating RN: [birth year] (72 y.o. [name] Primary Care Physician: Maggie Font Other Clinician: Referring Physician: Treating Physician/Extender: Starlyn Skeans,  Barrie Folk Weeks in Treatment: 3 Education Assessment Education Provided To: Patient Education Topics Provided Wound/Skin Impairment: Handouts: Caring for Your Ulcer Methods: Explain/Verbal Responses: Reinforcements needed Electronic Signature(s) Signed: 02/01/2023 5:24:12 PM By: Deon Pilling RN, BSN Entered By: Deon Pilling on 02/01/2023 13:35:20 [name], [name] ([id number]) [id number] Page 7 of 10 -------------------------------------------------------------------------------- Wound Assessment Details Patient Name: Date of Service: [name] 02/01/2023 12:30 PM Medical Record Number: [id number] Patient Account Number: [id number] Date of Birth/Sex: Treating RN: May 11, 1951 (72 y.o. [name] Primary Care Joniece Smotherman: Maggie Font Other Clinician: Referring Koki Buxton: Treating Britian Jentz/Extender: Wilnette Kales in Treatment: 3 Wound Status Wound Number: 1 Primary Arterial Insufficiency Ulcer Etiology: Wound Location: Right, Anterior Lower Leg Wound Status: Open Wounding Event: Trauma Comorbid Hypertension, Peripheral Arterial Disease, Peripheral Venous Date Acquired: 09/13/2022 History: Disease Weeks Of Treatment: 3 Clustered Wound: No Photos Wound Measurements Length: (cm) 5 Width: (cm) 6 Depth: (cm) 0 Area: (cm) Volume: (cm) % Reduction in Area: 86.1% .5 % Reduction in Volume: 86.1% .2 Epithelialization: Small (1-33%) 25.525 Tunneling: No 5.105 Undermining: No Wound Description Classification: Full Thickness With Exposed Suppor Wound Margin: Distinct, outline attached Exudate Amount: Large Exudate Type: Serosanguineous Exudate Color: red, brown t Structures Foul Odor After Cleansing: No Slough/Fibrino Yes Wound Bed Granulation Amount: Medium (34-66%) Exposed Structure Granulation Quality: Red, Pink Fascia Exposed: No Necrotic Amount: Medium (34-66%) Fat Layer (Subcutaneous  Tissue) Exposed: Yes Necrotic Quality: Adherent Slough Tendon Exposed: No Muscle Exposed: No Joint Exposed: No Bone Exposed: No Periwound Skin Texture Texture Color No Abnormalities Noted: No No Abnormalities Noted: No Callus: No Atrophie Blanche: No Crepitus: No Cyanosis: No Excoriation: No Ecchymosis: No Induration: No Erythema: Yes Rash: No Erythema Location: Circumferential Scarring: Yes Hemosiderin Staining: No Mottled: No Moisture Pallor: No No Abnormalities Noted: Yes Rubor: No Temperature / Pain [name], [name] ([id number]) [id number] Page 8 of 10 Temperature: No Abnormality Tenderness on Palpation: Yes Treatment Notes Wound #1 (Lower Leg) Wound Laterality: Right, Anterior Cleanser Soap and Water Discharge Instruction: May shower and wash wound with dial antibacterial soap and water prior to dressing change. Wound Cleanser Discharge Instruction: Cleanse the wound with wound cleanser prior to applying a clean dressing using gauze sponges, not tissue or cotton balls. Peri-Wound Care Topical Primary Dressing Hydrofera Blue Classic Foam, 4x4 in Discharge Instruction: apply over the topical antibiotics. Sor[employer], 4x4 (in/in) Discharge Instruction: *****APPLY TO ANY WEEPING AREAS TO RIGHT LOWER LEG.***** Keystone topical compounding antibiotics Discharge Instruction: applied directly to wound bed. Secondary Dressing ABD Pad, 5x9 Discharge Instruction: Apply over primary dressing as directed. Zetuvit Plus 4x8 in Discharge Instruction: Apply over primary dressing as directed. Secured With [employer] Size 5, 10 (yds) Compression Wrap Kerlix Roll 4.5x3.1 (in/yd) Discharge Instruction: Apply Kerlix and Coban compression as directed. Coban Self-Adherent Wrap 4x5 (in/yd) Discharge Instruction: Apply over Kerlix as directed. Compression Stockings Add-Ons Electronic Signature(s) Signed: 02/01/2023 5:24:12 PM By: Deon Pilling RN, BSN Entered By: Deon Pilling on 02/01/2023 13:28:55 -------------------------------------------------------------------------------- Wound Assessment Details Patient Name: Date of Service: Vincent Eng Lewis. 02/01/2023 12:30 PM Medical Record Number: [id number] Patient Account Number: [id number] Date of Birth/Sex: Treating RN: 1951/09/02 (72 y.o. [name] Primary Care Mikayela Deats: Maggie Font Other Clinician: Referring Tamora Huneke: Treating Bryker Fletchall/Extender: Wilnette Kales in Treatment: 3 Wound Status Wound Number: 2 Primary Dehisced Wound Etiology: Wound Location: Right, Medial Lower Leg Wound Status: Open Wounding Event: Surgical Injury Comorbid Hypertension, Peripheral Arterial  Disease, Peripheral Venous Date Acquired: 01/03/2023 History: Disease Weeks Of Treatment: 3 Clustered Wound: No Photos [name], [name] ([id number]) [id number] Page 9 of 10 Wound Measurements Length: (cm) 7.2 Width: (cm) 3.6 Depth: (cm) 2.3 Area: (cm) 20.358 Volume: (cm) 46.822 % Reduction in Area: -117.8% % Reduction in Volume: -1152.6% Epithelialization: Small (1-33%) Tunneling: No Undermining: No Wound Description Classification: Full Thickness With Exposed Support Structures Wound Margin: Distinct, outline attached Exudate Amount: Medium Exudate Type: Serosanguineous Exudate Color: red, brown Foul Odor After Cleansing: No Slough/Fibrino Yes Wound Bed Granulation Amount: Medium (34-66%) Exposed Structure Granulation Quality: Red, Hyper-granulation, Friable Fascia Exposed: No Necrotic Amount: Medium (34-66%) Fat Layer (Subcutaneous Tissue) Exposed: Yes Necrotic Quality: Adherent Slough Tendon Exposed: No Muscle Exposed: No Joint Exposed: No Bone Exposed: No Periwound Skin Texture Texture Color No Abnormalities Noted: No No Abnormalities Noted: No Callus: No Atrophie Blanche: No Crepitus: No Cyanosis:  No Excoriation: No Ecchymosis: No Induration: No Erythema: Yes Rash: No Erythema Location: Circumferential Scarring: Yes Hemosiderin Staining: No Mottled: No Moisture Pallor: No No Abnormalities Noted: No Rubor: No Dry / Scaly: No Maceration: Yes Temperature / Pain Temperature: No Abnormality Tenderness on Palpation: Yes Treatment Notes Wound #2 (Lower Leg) Wound Laterality: Right, Medial Cleanser Soap and Water Discharge Instruction: May shower and wash wound with dial antibacterial soap and water prior to dressing change. Wound Cleanser Discharge Instruction: Cleanse the wound with wound cleanser prior to applying a clean dressing using gauze sponges, not tissue or cotton balls. Peri-Wound Care Topical Primary Dressing Hydrofera Blue Classic Foam, 4x4 in Discharge Instruction: apply over the topical antibiotics. PACK INTO WOUND BED, UNDERMINING AND TUNNELING. Keystone topical compounding antibiotics Discharge Instruction: applied directly to wound bed. CONTINUE KEYSTONE TOPICAL ANTIBIOTICS AND HYDROFERA BLUE UNTIL THE WOUND VAC ARRIVES. ONCE ARRIVES APPLY WHITE AND BLACK FOAM TO THE MEDIAL LOWER LEG. [name], [name] ([id number]) [id number] Page 10 of 10 Secondary Dressing ABD Pad, 5x9 Discharge Instruction: Apply over primary dressing as directed. Woven Gauze Sponge, Non-Sterile 4x4 in Discharge Instruction: APPLY OVER THE HYDROFERA BLUE TO ENSURE CONTACT WITH WOUND BED. Zetuvit Plus 4x8 in Discharge Instruction: Apply over primary dressing as directed. Secured With [employer] Size 5, 10 (yds) Compression Wrap Kerlix Roll 4.5x3.1 (in/yd) Discharge Instruction: Apply Kerlix and Coban compression as directed. Coban Self-Adherent Wrap 4x5 (in/yd) Discharge Instruction: Apply over Kerlix as directed. Compression Stockings Add-Ons Electronic Signature(s) Signed: 02/01/2023 5:24:12 PM By: Deon Pilling RN, BSN Signed: 02/02/2023 1:13:01 PM  By: Erenest Blank Entered By: Erenest Blank on 02/01/2023 13:02:34 -------------------------------------------------------------------------------- Vitals Details Patient Name: Date of Service: Vincent Eng Lewis. 02/01/2023 12:30 PM Medical Record Number: [id number] Patient Account Number: [id number] Date of Birth/Sex: Treating RN: 03/23/1951 (72 y.o. [name] Primary Care Nikia Mangino: Maggie Font Other Clinician: Referring Jordyan Hardiman: Treating Jacci Ruberg/Extender: Wilnette Kales in Treatment: 3 Vital Signs Time Taken: 12:45 Temperature (F): 98.7 Pulse (bpm): 56 Respiratory Rate (breaths/min): 20 Blood Pressure (mmHg): 119/69 Reference Range: 80 - 120 mg / dl Electronic Signature(s) Signed: 02/01/2023 5:24:12 PM By: Deon Pilling RN, BSN Entered By: Deon Pilling on 02/01/2023 12:49:05

## 2023-02-02 NOTE — Progress Notes (Signed)
Vincent, Lewis ([id number]) [id number] Page 1 of 11 Visit Report for 02/01/2023 Chief Complaint Document Details Patient Name: Date of Service: [name] 02/01/2023 12:30 PM Medical Record Number: [id number] Patient Account Number: [id number] Date of Birth/Sex: Treating RN: 1951-12-24 (72 y.o. M) Primary Care Provider: Maggie Font Other Clinician: Referring Provider: Treating Provider/Extender: Wilnette Kales in Treatment: 3 Information Obtained from: Patient Chief Complaint 01/11/2023; arterial right lower extremity wound, surgical dehiscence to the medial right lower extremity Electronic Signature(s) Signed: 02/01/2023 3:04:17 PM By: Kalman Shan DO Entered By: Kalman Shan on 02/01/2023 13:46:23 -------------------------------------------------------------------------------- Debridement Details Patient Name: Date of Service: Vincent Eng R. 02/01/2023 12:30 PM Medical Record Number: [id number] Patient Account Number: [id number] Date of Birth/Sex: Treating RN: 08-19-1951 (72 y.o. Lorette Ang, Meta.Reding Primary Care Provider: Maggie Font Other Clinician: Referring Provider: Treating Provider/Extender: Wilnette Kales in Treatment: 3 Debridement Performed for Assessment: Wound #2 Right,Medial Lower Leg Performed By: Physician Kalman Shan, DO Debridement Type: Debridement Level of Consciousness (Pre-procedure): Awake and Alert Pre-procedure Verification/Time Out Yes - 13:20 Taken: Start Time: 13:21 Pain Control: Lidocaine 4% T opical Solution T Area Debrided (L x W): otal 7.2 (cm) x 3.6 (cm) = 25.92 (cm) Tissue and other material debrided: Viable, Non-Viable, Muscle, Slough, Subcutaneous, Skin: Dermis , Skin: Epidermis, Biofilm, Slough Level: Skin/Subcutaneous Tissue/Muscle Debridement Description: Excisional Instrument: Curette Bleeding: Minimum Hemostasis Achieved:  Pressure End Time: 13:27 Procedural Pain: 0 Post Procedural Pain: 2 Response to Treatment: Procedure was tolerated well Level of Consciousness (Post- Awake and Alert procedure): Post Debridement Measurements of Total Wound Length: (cm) 7.2 Width: (cm) 3.6 Depth: (cm) 2.3 Volume: (cm) 46.822 Character of Wound/Ulcer Post Debridement: Requires Further Debridement Post Procedure Diagnosis JAMIAS, DAMM ([id number]) [id number] Page 2 of 11 Same as Pre-procedure Electronic Signature(s) Signed: 02/01/2023 3:04:17 PM By: Kalman Shan DO Signed: 02/01/2023 5:24:12 PM By: Deon Pilling RN, BSN Entered By: Deon Pilling on 02/01/2023 13:28:12 -------------------------------------------------------------------------------- Debridement Details Patient Name: Date of Service: Vincent Eng R. 02/01/2023 12:30 PM Medical Record Number: [id number] Patient Account Number: [id number] Date of Birth/Sex: Treating RN: Jul 21, 1951 (72 y.o. Lorette Ang, Meta.Reding Primary Care Provider: Maggie Font Other Clinician: Referring Provider: Treating Provider/Extender: Wilnette Kales in Treatment: 3 Debridement Performed for Assessment: Wound #1 Right,Anterior Lower Leg Performed By: Physician Kalman Shan, DO Debridement Type: Debridement Severity of Tissue Pre Debridement: Fat layer exposed Level of Consciousness (Pre-procedure): Awake and Alert Pre-procedure Verification/Time Out Yes - 13:20 Taken: Start Time: 13:21 Pain Control: Lidocaine 4% T opical Solution T Area Debrided (L x W): otal 5 (cm) x 6.5 (cm) = 32.5 (cm) Tissue and other material debrided: Viable, Non-Viable, Slough, Subcutaneous, Skin: Dermis , Skin: Epidermis, Slough Level: Skin/Subcutaneous Tissue Debridement Description: Excisional Instrument: Curette Bleeding: Minimum Hemostasis Achieved: Pressure End Time: 13:29 Procedural Pain: 0 Post Procedural Pain:  2 Response to Treatment: Procedure was tolerated well Level of Consciousness (Post- Awake and Alert procedure): Post Debridement Measurements of Total Wound Length: (cm) 5 Width: (cm) 6.5 Depth: (cm) 0.2 Volume: (cm) 5.105 Character of Wound/Ulcer Post Debridement: Requires Further Debridement Severity of Tissue Post Debridement: Fat layer exposed Post Procedure Diagnosis Same as Pre-procedure Electronic Signature(s) Signed: 02/01/2023 3:04:17 PM By: Kalman Shan DO Signed: 02/01/2023 5:24:12 PM By: Deon Pilling RN, BSN Entered By: Deon Pilling on 02/01/2023 13:30:16 -------------------------------------------------------------------------------- HPI Details Patient Name: Date of Service: Vincent Fulton Reek R. 02/01/2023  12:30 PM Medical Record Number: [id number] Patient Account Number: [id number] LUI, WAGEMAN ([id number]) [id number] Page 3 of 11 Date of Birth/Sex: Treating RN: 10/16/1951 (72 y.o. M) Primary Care Provider: Maggie Font Other Clinician: Referring Provider: Treating Provider/Extender: Wilnette Kales in Treatment: 3 History of Present Illness HPI Description: 01/11/2023 [name] is a 72 year old male with a past medical history of peripheral arterial disease status post femoropopliteal bypass graft on 12/12/2022 and prostate cancer that presents to the clinic with 2 wounds to his right lower extremity. He states that in October 2023 he was wearing a boot that rubbed into his leg creating a wound. Since the wound was not healing he had ABIs completed that showed a right ABI of 0.35 and TBI of 0. [facility], Dr. Donnetta Hutching who did a femoropopliteal bypass graft on the right on 12/12/2022. He reports chronic pain to the wound site. He reports having staples removed to the medial right leg 1 week ago and the wound site has dehisced. He has not been dressing either of the wound beds. He  currently denies systemic signs of infection. 1/25; patient presents for follow-up. He followed up with vein and vascular yesterday. It was noted that he had excellent biphasic flow in the posterior tibial of the right lower extremity. He has been using Hydrofera Blue and Santyl to the wound beds. He has chronic pain to the wound sites. 2/1; patient presents for follow-up. Patient had a PCR culture done at last clinic visit that showed E. coli, coagulase-negative staph, Candida parapsilosis and actinotignum schaalii. He is currently on ciprofloxacin that covers the E. coli. I will switch him over to Augmentin. We ordered [facility] antibiotic ointment and patient has paid for this and states it is coming in the mail tomorrow. For now he has been using Vashe wet-to-dry dressings. 2/6; patient presents for follow-up. He obtained the [facility] antibiotic in the mail. He started this Saturday. He has had no issues with it. He also continues to take Augmentin. He has a few days left. He denies systemic signs of infection. 2/8; patient presents for follow-up. He had a wrap placed at last clinic visit and he tolerated this well. We have been using Keystone antibiotic ointment with Hydrofera Blue under Kerlix/Coban. We have ordered the wound VAC but have not heard back for insurance approval. He currently denies systemic signs of infection. Electronic Signature(s) Signed: 02/01/2023 3:04:17 PM By: Kalman Shan DO Entered By: Kalman Shan on 02/01/2023 13:46:37 -------------------------------------------------------------------------------- Physical Exam Details Patient Name: Date of Service: Vincent Eng R. 02/01/2023 12:30 PM Medical Record Number: [id number] Patient Account Number: [id number] Date of Birth/Sex: Treating RN: Oct 25, 1951 (72 y.o. M) Primary Care Provider: Maggie Font Other Clinician: Referring Provider: Treating Provider/Extender: Wilnette Kales in  Treatment: 3 Constitutional respirations regular, non-labored and within target range for patient.. Cardiovascular 2+ dorsalis pedis/posterior tibialis pulses. Psychiatric pleasant and cooperative. Notes Right lower extremity: T the anterior distal aspect there is an open wound with nonviable surface and granulation tissue. T the medial right leg there is an o o incision site with a large dehisced wound with non viable tissue and granulation tissue. no purulent drainage. 2+ pitting edema to the knee. No surrounding signs of soft tissue infection to any of the wound beds. Electronic Signature(s) Signed: 02/01/2023 3:04:17 PM By: Kalman Shan DO Entered By: Kalman Shan on 02/01/2023 13:46:51 Gleneagle, Franklin R ([id number]) [id number] Page 4 of  11 -------------------------------------------------------------------------------- Physician Orders Details Patient Name: Date of Service: [name] 02/01/2023 12:30 PM Medical Record Number: [id number] Patient Account Number: [id number] Date of Birth/Sex: Treating RN: December 09, 1951 (72 y.o. Hessie Diener Primary Care Provider: Maggie Font Other Clinician: Referring Provider: Treating Provider/Extender: Wilnette Kales in Treatment: 3 Verbal / Phone Orders: No Diagnosis Coding Follow-up Appointments ppointment in 1 week. - w/ Dr. Heber [place] 02/08/2023 0930 Return A ppointment in 2 weeks. - w/ Dr. Heber [place] and Allayne Butcher Rm # 9 02/15/2023 0930 Return A Other: - Bring in topical antibiotics to each appt time. wound vac bring in at next appt time if approved by insurance. Anesthetic (In clinic) Topical Lidocaine 5% applied to wound bed Cellular or Tissue Based Products Wound #1 Right,Anterior Lower Leg Cellular or Tissue Based Product Type: - 02/01/2023 organogenesis run insurance for approval. Bathing/ Shower/ Hygiene May shower with protection but do not get wound dressing(s)  wet. Protect dressing(s) with water repellant cover (for example, large plastic bag) or a cast cover and may then take shower. - May wash wounds with dial antibacterial soap in the shower if you immediately dress wound after shower. Negative Presssure Wound Therapy Wound #2 Right,Medial Lower Leg Wound Vac to wound continuously at 136m/hg pressure - Will order to be placed for next week- 2-3 times a week dressing change. [facility] will call you with cost of wound vac before shipping to your home. Bring in next wound vac at next appt time. Black and White Foam combination Edema Control - Lymphedema / SCD / Other Elevate legs to the level of the heart or above for 30 minutes daily and/or when sitting for 3-4 times a day throughout the day. Avoid standing for long periods of time. Additional Orders / Instructions Follow Nutritious Diet - increase protein intake with meals, drink boost or Glucerna, and use Juven. Home Health [facility]. May utilize formulary equivalent dressing for wound treatment orders unless otherwise specified. New wound care orders this week; continue Home Health for wound care. May utilize formulary equivalent dressing for wound treatment orders unless otherwise specified. - both wounds topical compounding antibiotics hydrofera blue abd pad, kerlix and coban until wound vac arrives next week then apply it to right medial lower leg. Continue the topical antibiotics hydrofera blue to right circumferential lower leg kerlix coban. Dressing changes to be completed by [facility] Tuesday / Thursday / Saturday [facility] Other Home Health Orders/Instructions: Wound Treatment Wound #1 - Lower Leg Wound Laterality: Right, Anterior Cleanser: Soap and Water 3 x Per Week/30 Days Discharge Instructions: May shower and wash wound with dial antibacterial soap and water prior to dressing  change. Cleanser: Wound Cleanser (Generic) 3 x Per Week/30 Days Discharge Instructions: Cleanse the wound with wound cleanser prior to applying a clean dressing using gauze sponges, not tissue or cotton balls. Prim Dressing: Hydrofera Blue Classic Foam, 4x4 in 3 x Per Week/30 Days ary Discharge Instructions: apply over the topical antibiotics. Prim Dressing: Sorbal[employer] Dressing, 4x4 (in/in) 3 x Per Week/30 Days ary Discharge Instructions: *****APPLY TO ANY WEEPING AREAS TO RIGHT LOWER LEG.***** Prim Dressing: Keystone topical compounding antibiotics 3 x Per Week/30 Days ary Discharge Instructions: applied directly to wound bed. Secondary Dressing: ABD Pad, 5x9 (Generic) 3 x Per Week/30 Days Discharge Instructions: Apply over primary dressing as directed. Secondary Dressing: Zetuvit Plus 4x8 in 3  x Per Week/30 Days Discharge Instructions: Apply over primary dressing as directed. Secured With: Stretch Net Size 5, 10 (yds) (Generic) 3 x Per Week/30 Days AARON, MALKIN ([id number]) [id number] Page 5 of 11 Compression Wrap: Kerlix Roll 4.5x3.1 (in/yd) 3 x Per Week/30 Days Discharge Instructions: Apply Kerlix and Coban compression as directed. Compression Wrap: Coban Self-Adherent Wrap 4x5 (in/yd) 3 x Per Week/30 Days Discharge Instructions: Apply over Kerlix as directed. Wound #2 - Lower Leg Wound Laterality: Right, Medial Cleanser: Soap and Water 3 x Per Week/30 Days Discharge Instructions: May shower and wash wound with dial antibacterial soap and water prior to dressing change. Cleanser: Wound Cleanser (Generic) 3 x Per Week/30 Days Discharge Instructions: Cleanse the wound with wound cleanser prior to applying a clean dressing using gauze sponges, not tissue or cotton balls. Prim Dressing: Hydrofera Blue Classic Foam, 4x4 in 3 x Per Week/30 Days ary Discharge Instructions: apply over the topical antibiotics. PACK INTO WOUND BED, UNDERMINING AND  TUNNELING. Prim Dressing: Keystone topical compounding antibiotics 3 x Per Week/30 Days ary Discharge Instructions: applied directly to wound bed. Prim Dressing: CONTINUE KEYSTONE TOPICAL ANTIBIOTICS AND HYDROFERA BLUE UNTIL THE WOUND VAC ARRIVES. ONCE ARRIVES APPLY ary WHITE AND BLACK FOAM TO THE MEDIAL LOWER LEG. 3 x Per Week/30 Days Secondary Dressing: ABD Pad, 5x9 (Generic) 3 x Per Week/30 Days Discharge Instructions: Apply over primary dressing as directed. Secondary Dressing: Woven Gauze Sponge, Non-Sterile 4x4 in 3 x Per Week/30 Days Discharge Instructions: APPLY OVER THE HYDROFERA BLUE TO ENSURE CONTACT WITH WOUND BED. Secondary Dressing: Zetuvit Plus 4x8 in 3 x Per Week/30 Days Discharge Instructions: Apply over primary dressing as directed. Secured With: [employer] Size 5, 10 (yds) (Generic) 3 x Per Week/30 Days Compression Wrap: Kerlix Roll 4.5x3.1 (in/yd) 3 x Per Week/30 Days Discharge Instructions: Apply Kerlix and Coban compression as directed. Compression Wrap: Coban Self-Adherent Wrap 4x5 (in/yd) 3 x Per Week/30 Days Discharge Instructions: Apply over Kerlix as directed. Electronic Signature(s) Signed: 02/01/2023 5:24:12 PM By: Deon Pilling RN, BSN Signed: 02/02/2023 12:26:02 PM By: Kalman Shan DO Previous Signature: 02/01/2023 3:04:17 PM Version By: Kalman Shan DO Entered By: Deon Pilling on 02/01/2023 16:42:24 -------------------------------------------------------------------------------- Problem List Details Patient Name: Date of Service: Vincent Eng R. 02/01/2023 12:30 PM Medical Record Number: [id number] Patient Account Number: [id number] Date of Birth/Sex: Treating RN: [birth year] (72 y.o. M) Primary Care Provider: Maggie Font Other Clinician: Referring Provider: Treating Provider/Extender: Wilnette Kales in Treatment: 3 Active Problems ICD-10 Encounter Code Description Active Date MDM Diagnosis L97.815 Non-pressure  chronic ulcer of other part of right lower leg with muscle 02/01/2023 No Yes involvement without evidence of necrosis L97.812 Non-pressure chronic ulcer of other part of right lower leg with fat layer 02/01/2023 No Yes Vincent, KELLOUGH ([id number]) [id number] Page 6 of 11 exposed I70.238 Atherosclerosis of native arteries of right leg with ulceration of other part of 01/11/2023 No Yes lower leg I87.311 Chronic venous hypertension (idiopathic) with ulcer of right lower extremity 02/01/2023 No Yes T81.31XA Disruption of external operation (surgical) wound, not elsewhere classified, 01/11/2023 No Yes initial encounter Inactive Problems Resolved Problems Electronic Signature(s) Signed: 02/01/2023 3:04:17 PM By: Kalman Shan DO Entered By: Kalman Shan on 02/01/2023 13:46:11 -------------------------------------------------------------------------------- Progress Note Details Patient Name: Date of Service: Vincent Eng R. 02/01/2023 12:30 PM Medical Record Number: [id number] Patient Account Number: [id number] Date of Birth/Sex: Treating RN: 1951/10/01 (72 y.o. M) Primary Care Provider: Maggie Font Other Clinician:  Referring Provider: Treating Provider/Extender: Wilnette Kales in Treatment: 3 Subjective Chief Complaint Information obtained from Patient 01/11/2023; arterial right lower extremity wound, surgical dehiscence to the medial right lower extremity History of Present Illness (HPI) 01/11/2023 Mr. Tramayne Bourgeois is a 72 year old male with a past medical history of peripheral arterial disease status post femoropopliteal bypass graft on 12/12/2022 and prostate cancer that presents to the clinic with 2 wounds to his right lower extremity. He states that in October 2023 he was wearing a boot that rubbed into his leg creating a wound. Since the wound was not healing he had ABIs completed that showed a right ABI of 0.35 and  TBI of 0. He was [facility] to [facility], Dr. Donnetta Hutching who did a femoropopliteal bypass graft on the right on 12/12/2022. He reports chronic pain to the wound site. He reports having staples removed to the medial right leg 1 week ago and the wound site has dehisced. He has not been dressing either of the wound beds. He currently denies systemic signs of infection. 1/25; patient presents for follow-up. He followed up with vein and vascular yesterday. It was noted that he had excellent biphasic flow in the posterior tibial of the right lower extremity. He has been using Hydrofera Blue and Santyl to the wound beds. He has chronic pain to the wound sites. 2/1; patient presents for follow-up. Patient had a PCR culture done at last clinic visit that showed E. coli, coagulase-negative staph, Candida parapsilosis and actinotignum schaalii. He is currently on ciprofloxacin that covers the E. coli. I will switch him over to Augmentin. We ordered [facility] antibiotic ointment and patient has paid for this and states it is coming in the mail tomorrow. For now he has been using Vashe wet-to-dry dressings. 2/6; patient presents for follow-up. He obtained the [facility] antibiotic in the mail. He started this Saturday. He has had no issues with it. He also continues to take Augmentin. He has a few days left. He denies systemic signs of infection. 2/8; patient presents for follow-up. He had a wrap placed at last clinic visit and he tolerated this well. We have been using Keystone antibiotic ointment with Hydrofera Blue under Kerlix/Coban. We have ordered the wound VAC but have not heard back for insurance approval. He currently denies systemic signs of infection. Patient History Information obtained from Patient, Chart. Family History Unknown History. PIERINO, TOMLINS ([id number]) [id number] Page 7 of 53 Social History Former smoker, Marital Status - Single, Alcohol Use - Never, Drug Use - No  History, Caffeine Use - Rarely. Medical History Cardiovascular Patient has history of Hypertension, Peripheral Arterial Disease, Peripheral Venous Disease Hospitalization/Surgery History - femoral-tibial bypass graft right 12/12/22. - radical prostatectomy 2011. - tonsillectomy age 55. Medical A Surgical History Notes nd Cardiovascular Hyperlipidemia Gastrointestinal GERD Oncologic prostate ca Objective Constitutional respirations regular, non-labored and within target range for patient.. Vitals Time Taken: 12:45 PM, Temperature: 98.7 F, Pulse: 56 bpm, Respiratory Rate: 20 breaths/min, Blood Pressure: 119/69 mmHg. Cardiovascular 2+ dorsalis pedis/posterior tibialis pulses. Psychiatric pleasant and cooperative. General Notes: Right lower extremity: T the anterior distal aspect there is an open wound with nonviable surface and granulation tissue. T the medial right leg o o there is an incision site with a large dehisced wound with non viable tissue and granulation tissue. no purulent drainage. 2+ pitting edema to the knee. No surrounding signs of soft tissue infection to any of the wound beds. Integumentary (Hair, Skin) Wound #1 status is Open. Original  cause of wound was Trauma. The date acquired was: 09/13/2022. The wound has been in treatment 3 weeks. The wound is located on the Right,Anterior Lower Leg. The wound measures 5cm length x 6.5cm width x 0.2cm depth; 25.525cm^2 area and 5.105cm^3 volume. There is Fat Layer (Subcutaneous Tissue) exposed. There is no tunneling or undermining noted. There is a large amount of serosanguineous drainage noted. The wound margin is distinct with the outline attached to the wound base. There is medium (34-66%) red, pink granulation within the wound bed. There is a medium (34-66%) amount of necrotic tissue within the wound bed including Adherent Slough. The periwound skin appearance had no abnormalities noted for moisture. The periwound skin  appearance exhibited: Scarring, Erythema. The periwound skin appearance did not exhibit: Callus, Crepitus, Excoriation, Induration, Rash, Atrophie Blanche, Cyanosis, Ecchymosis, Hemosiderin Staining, Mottled, Pallor, Rubor. The surrounding wound skin color is noted with erythema which is circumferential. Periwound temperature was noted as No Abnormality. The periwound has tenderness on palpation. Wound #2 status is Open. Original cause of wound was Surgical Injury. The date acquired was: 01/03/2023. The wound has been in treatment 3 weeks. The wound is located on the Right,Medial Lower Leg. The wound measures 7.2cm length x 3.6cm width x 2.3cm depth; 20.358cm^2 area and 46.822cm^3 volume. There is Fat Layer (Subcutaneous Tissue) exposed. There is no tunneling or undermining noted. There is a medium amount of serosanguineous drainage noted. The wound margin is distinct with the outline attached to the wound base. There is medium (34-66%) red, friable, hyper - granulation within the wound bed. There is a medium (34-66%) amount of necrotic tissue within the wound bed including Adherent Slough. The periwound skin appearance exhibited: Scarring, Maceration, Erythema. The periwound skin appearance did not exhibit: Callus, Crepitus, Excoriation, Induration, Rash, Dry/Scaly, Atrophie Blanche, Cyanosis, Ecchymosis, Hemosiderin Staining, Mottled, Pallor, Rubor. The surrounding wound skin color is noted with erythema which is circumferential. Periwound temperature was noted as No Abnormality. The periwound has tenderness on palpation. Assessment Active Problems ICD-10 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 without evidence of necrosis Non-pressure chronic ulcer of other part of right lower leg with fat layer exposed Atherosclerosis of native arteries of right leg with ulceration of other part of lower leg Chronic venous hypertension (idiopathic) with ulcer of right  lower extremity Disruption of external operation (surgical) wound, not elsewhere classified, initial encounter Patient's wounds are stable. I debrided nonviable tissue. No signs of surrounding infection. He tolerated the wrap well. I recommended continuing the course with Hydrofera Blue and Keystone antibiotic under Kerlix/Coban. He is close to benefiting from a skin substitute to the right anterior leg. He is agreeable to trying this. We will run insurance verification for organogenesis. Still awaiting to hear from insurance for the wound VAC for the more proximal right lower extremity wound. Follow-up in 1 week. Lewis, Vincent ([id number]) [id number] Page 8 of 11 Procedures Wound #1 Pre-procedure diagnosis of Wound #1 is an Arterial Insufficiency Ulcer located on the Right,Anterior Lower Leg .Severity of Tissue Pre Debridement is: Fat layer exposed. There was a Excisional Skin/Subcutaneous Tissue Debridement with a total area of 32.5 sq cm performed by Kalman Shan, DO. With the following instrument(s): Curette to remove Viable and Non-Viable tissue/material. Material removed includes Subcutaneous Tissue, Slough, Skin: Dermis, and Skin: Epidermis after achieving pain control using Lidocaine 4% Topical Solution. A time out was conducted at 13:20, prior to the start of the procedure. A Minimum amount of bleeding was  controlled with Pressure. The procedure was tolerated well with a pain level of 0 throughout and a pain level of 2 following the procedure. Post Debridement Measurements: 5cm length x 6.5cm width x 0.2cm depth; 5.105cm^3 volume. Character of Wound/Ulcer Post Debridement requires further debridement. Severity of Tissue Post Debridement is: Fat layer exposed. Post procedure Diagnosis Wound #1: Same as Pre-Procedure Wound #2 Pre-procedure diagnosis of Wound #2 is a Dehisced Wound located on the Right,Medial Lower Leg . There was a Excisional  Skin/Subcutaneous Tissue/Muscle Debridement with a total area of 25.92 sq cm performed by Kalman Shan, DO. With the following instrument(s): Curette to remove Viable and Non-Viable tissue/material. Material removed includes Muscle, Subcutaneous Tissue, Slough, Skin: Dermis, Skin: Epidermis, and Biofilm after achieving pain control using Lidocaine 4% T opical Solution. A time out was conducted at 13:20, prior to the start of the procedure. A Minimum amount of bleeding was controlled with Pressure. The procedure was tolerated well with a pain level of 0 throughout and a pain level of 2 following the procedure. Post Debridement Measurements: 7.2cm length x 3.6cm width x 2.3cm depth; 46.822cm^3 volume. Character of Wound/Ulcer Post Debridement requires further debridement. Post procedure Diagnosis Wound #2: Same as Pre-Procedure Plan Follow-up Appointments: Return Appointment in 1 week. - w/ Dr. Heber Quamba 02/08/2023 0930 Return Appointment in 2 weeks. - w/ Dr. Heber Culbertson and Allayne Butcher Rm # 9 02/15/2023 0930 Other: - Bring in topical antibiotics to each appt time. wound vac bring in at next appt time if approved by insurance. Anesthetic: (In clinic) Topical Lidocaine 5% applied to wound bed Cellular or Tissue Based Products: Wound #1 Right,Anterior Lower Leg: Cellular or Tissue Based Product Type: - 02/01/2023 organogenesis run insurance for approval. Bathing/ Shower/ Hygiene: May shower with protection but do not get wound dressing(s) wet. Protect dressing(s) with water repellant cover (for example, large plastic bag) or a cast cover and may then take shower. - May wash wounds with dial antibacterial soap in the shower if you immediately dress wound after shower. Negative Presssure Wound Therapy: Wound #2 Right,Medial Lower Leg: Wound Vac to wound continuously at 151m/hg pressure - Will order to be placed for next week- 2-3 times a week dressing change. [facility] will call you with cost of wound vac  before shipping to your home. Bring in next wound vac at next appt time. Black and White Foam combination Edema Control - Lymphedema / SCD / Other: Elevate legs to the level of the heart or above for 30 minutes daily and/or when sitting for 3-4 times a day throughout the day. Avoid standing for long periods of time. Home Health: Admit to Home Health for skilled nursing wound care. May utilize formulary equivalent dressing for wound treatment orders unless otherwise specified. New wound care orders this week; continue Home Health for wound care. May utilize formulary equivalent dressing for wound treatment orders unless otherwise specified. - both wounds topical compounding antibiotics hydrofera blue abd pad, kerlix and coban until wound vac arrives next week then apply it to right medial lower leg. Continue the topical antibiotics hydrofera blue to right circumferential lower leg kerlix coban. Dressing changes to be completed by HCasa Conejoon Tuesday / Thursday / Saturday [facility] Other Home [facility] Orders/Instructions: WOUND #1: - Lower Leg Wound Laterality: Right, Anterior Cleanser: Soap and Water 3 x Per Week/30 Days Discharge Instructions: May shower and wash wound with dial antibacterial soap and water prior to dressing change. Cleanser: Wound Cleanser (  Generic) 3 x Per Week/30 Days Discharge Instructions: Cleanse the wound with wound cleanser prior to applying a clean dressing using gauze sponges, not tissue or cotton balls. Prim Dressing: Hydrofera Blue Classic Foam, 4x4 in 3 x Per Week/30 Days ary Discharge Instructions: apply over the topical antibiotics. Prim Dressing: Sorbalgon AG Dressing, 4x4 (in/in) 3 x Per Week/30 Days ary Discharge Instructions: *****APPLY TO ANY WEEPING AREAS TO RIGHT LOWER LEG.***** Prim Dressing: Keystone topical compounding antibiotics 3 x Per Week/30 Days ary Discharge Instructions: applied directly to  wound bed. Secondary Dressing: ABD Pad, 5x9 (Generic) 3 x Per Week/30 Days Discharge Instructions: Apply over primary dressing as directed. Secondary Dressing: Zetuvit Plus 4x8 in 3 x Per Week/30 Days Discharge Instructions: Apply over primary dressing as directed. Secured With: [employer] Size 5, 10 (yds) (Generic) 3 x Per Week/30 Days Com pression Wrap: Kerlix Roll 4.5x3.1 (in/yd) 3 x Per Week/30 Days Discharge Instructions: Apply Kerlix and Coban compression as directed. Com pression Wrap: Coban Self-Adherent Wrap 4x5 (in/yd) 3 x Per Week/30 Days Discharge Instructions: Apply over Kerlix as directed. WOUND #2: - Lower Leg Wound Laterality: Right, Medial Cleanser: Soap and Water 3 x Per Week/30 Days Discharge Instructions: May shower and wash wound with dial antibacterial soap and water prior to dressing change. Cleanser: Wound Cleanser (Generic) 3 x Per Week/30 Days Discharge Instructions: Cleanse the wound with wound cleanser prior to applying a clean dressing using gauze sponges, not tissue or cotton balls. Prim Dressing: Hydrofera Blue Classic Foam, 4x4 in 3 x Per Week/30 Days ary Discharge Instructions: apply over the topical antibiotics. PACK INTO WOUND BED, UNDERMINING AND TUNNELING. Prim Dressing: Keystone topical compounding antibiotics 3 x Per Week/30 Days ary Discharge Instructions: applied directly to wound bed. Prim Dressing: CONTINUE KEYSTONE TOPICAL ANTIBIOTICS AND HYDROFERA BLUE UNTIL THE WOUND VAC ARRIVES. ONCE ARRIVES APPLY ary WHITE AND BLACK FOAM TO THE MEDIAL LOWER LEG. 3 x Per Week/30 Days Secondary Dressing: ABD Pad, 5x9 (Generic) 3 x Per Week/30 Days Discharge Instructions: Apply over primary dressing as directed. Lewis, Vincent ([id number]) [id number] Page 9 of 11 Secondary Dressing: Woven Gauze Sponge, Non-Sterile 4x4 in 3 x Per Week/30 Days Discharge Instructions: APPLY OVER THE HYDROFERA BLUE TO ENSURE CONTACT WITH WOUND  BED. Secondary Dressing: Zetuvit Plus 4x8 in 3 x Per Week/30 Days Discharge Instructions: Apply over primary dressing as directed. Secured With: [employer] Size 5, 10 (yds) (Generic) 3 x Per Week/30 Days Compression Wrap: Kerlix Roll 4.5x3.1 (in/yd) 3 x Per Week/30 Days Discharge Instructions: Apply Kerlix and Coban compression as directed. Compression Wrap: Coban Self-Adherent Wrap 4x5 (in/yd) 3 x Per Week/30 Days Discharge Instructions: Apply over Kerlix as directed. 1. In office sharp debridement 2. Hydrofera Blue and Keystone antibiotic ointment under Kerlix/Cobanooright lower extremity 3. Run IVR for organogenesisooskin substitute 4. Follow-up in 1 week Electronic Signature(s) Signed: 02/01/2023 3:04:17 PM By: Kalman Shan DO Entered By: Kalman Shan on 02/01/2023 13:49:49 -------------------------------------------------------------------------------- HxROS Details Patient Name: Date of Service: Vincent Eng R. 02/01/2023 12:30 PM Medical Record Number: [id number] Patient Account Number: [id number] Date of Birth/Sex: Treating RN: July 23, 1951 (72 y.o. M) Primary Care Provider: Maggie Font Other Clinician: Referring Provider: Treating Provider/Extender: Wilnette Kales in Treatment: 3 Information Obtained From Patient Chart Cardiovascular Medical History: Positive for: Hypertension; Peripheral Arterial Disease; Peripheral Venous Disease Past Medical History Notes: Hyperlipidemia Gastrointestinal Medical History: Past Medical History Notes: GERD Oncologic Medical History: Past Medical History Notes: prostate ca Immunizations Pneumococcal Vaccine: Received  Pneumococcal Vaccination: Yes Received Pneumococcal Vaccination On or After 60th Birthday: Yes Implantable Devices None Hospitalization / Surgery History Type of Hospitalization/Surgery femoral-tibial bypass graft right 12/12/22 radical prostatectomy 2011 tonsillectomy age  16 Family and Social History Unknown History: Yes; Former smoker; Marital Status - Single; Alcohol Use: Never; Drug Use: No History; Caffeine Use: Rarely; Financial Concerns: NoAZIAH, TIPPIE ([id number]) [id number] Page 10 of 11 Food, Clothing or Shelter Needs: No; Support System Lacking: No; Transportation Concerns: No Engineer, maintenance) Signed: 02/01/2023 3:04:17 PM By: Kalman Shan DO Entered By: Kalman Shan on 02/01/2023 13:46:40 -------------------------------------------------------------------------------- SuperBill Details Patient Name: Date of Service: [name]. 02/01/2023 Medical Record Number: [id number] Patient Account Number: [id number] Date of Birth/Sex: Treating RN: 04-14-1951 (72 y.o. 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 L97.812 Non-pressure chronic ulcer of other part of right lower leg with fat layer exposed I70.238 Atherosclerosis of native arteries of right leg with ulceration of other part of lower leg I87.311 Chronic venous hypertension (idiopathic) with ulcer of right lower extremity T81.31XA Disruption of external operation (surgical) wound, not elsewhere classified, initial encounter Facility Procedures : CPT4 Code Description: [id number] 11042 - DEB SUBQ TISSUE 20 SQ CM/< ICD-10 Diagnosis Description L97.812 Non-pressure chronic ulcer of other part of right lower leg with fat layer exposed I87.311 Chronic venous hypertension (idiopathic) with ulcer of  right lower extremity I70.238 Atherosclerosis of native arteries of right leg with ulceration of other part of lower Modifier: leg Quantity: 1 : CPT4  Code Description: [id number] 11045 - DEB SUBQ TISS EA ADDL 20CM ICD-10 Diagnosis Description [id number] Non-pressure chronic ulcer of other part of right lower leg with fat layer exposed I87.311 Chronic venous hypertension (idiopathic) with ulcer of  right lower extremity I70.238 Atherosclerosis of native arteries of right leg with ulceration of other part of lower Modifier: leg Quantity: 1 : CPT4 Code Description: [id number] 11043 - DEB MUSC/FASCIA 20 SQ CM/< ICD-10 Diagnosis Description L97.815 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 wit I87.311 Chronic venous hypertension (idiopathic) with ulcer of  right lower extremity I70.238 Atherosclerosis of native arteries of right leg with ulceration of other part of lower T81.31XA Disruption of external operation (surgical) wound, not elsewhere classified, initial en Modifier: hout evidence leg counter Quantity: 1 of necrosis : CPT4 Code Description: [id number] 11046 - DEB MUSC/FASCIA EA ADDL 20 CM ICD-10 Diagnosis Description L97.815 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 wit I87.311 Chronic venous hypertension (idiopathic) with ulcer  of right lower extremity I70.238 Atherosclerosis of native arteries of right leg with ulceration of other part of lower T81.31XA Disruption of external operation (surgical) wound, not elsewhere classified, initial en Modifier: hout evidence leg counter Quantity: 1 of necrosis Physician Procedures : CPT4 Code Description Modifier [id number] 11042 - WC PHYS SUBQ TISS 20 SQ CM ICD-10 Diagnosis Description L97.812 Non-pressure chronic ulcer of other part of right lower leg with fat layer exposed I87.311 Chronic venous hypertension (idiopathic) with  ulcer of right lower extremity I70.238 Atherosclerosis of native arteries of right leg with ulceration of other part of lower leg OLSON, FACKRELL R ([id number]) [id number]  11045 - WC PHYS SUBQ TISS EA ADDL 20 CM ICD-10  Diagnosis Description  [id number] Non-pressure chronic ulcer of other part of right lower leg with fat layer exposed I87.311 Chronic venous hypertension (idiopathic) with ulcer of right lower extremity I70.238 Atherosclerosis of native arteries of right leg  with ulceration of other part of lower leg Quantity: 1 7.pdf Page 11 of 11 1 : [id number] 11043 - WC PHYS DEBR MUSCLE/FASCIA 20 SQ CM ICD-10 Diagnosis Description L97.815 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 without evidence of n I87.311 Chronic venous hypertension (idiopathic) with  ulcer of right lower extremity I70.238 Atherosclerosis of native arteries of right leg with ulceration of other part of lower leg T81.31XA Disruption of external operation (surgical) wound, not elsewhere classified, initial encounter Quantity: 1 ecrosis : [id number] 11046 - WC PHYS DEB MUSC/FASC EA ADDL 20 CM ICD-10 Diagnosis Description L97.815 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

## 2023-02-06 DIAGNOSIS — S91301D Unspecified open wound, right foot, subsequent encounter: Secondary | ICD-10-CM | POA: Diagnosis not present

## 2023-02-06 DIAGNOSIS — T8131XA Disruption of external operation (surgical) wound, not elsewhere classified, initial encounter: Secondary | ICD-10-CM | POA: Diagnosis not present

## 2023-02-06 DIAGNOSIS — R600 Localized edema: Secondary | ICD-10-CM | POA: Diagnosis not present

## 2023-02-06 DIAGNOSIS — I70238 Atherosclerosis of native arteries of right leg with ulceration of other part of lower right leg: Secondary | ICD-10-CM | POA: Diagnosis not present

## 2023-02-06 DIAGNOSIS — S81801A Unspecified open wound, right lower leg, initial encounter: Secondary | ICD-10-CM | POA: Diagnosis not present

## 2023-02-06 DIAGNOSIS — I10 Essential (primary) hypertension: Secondary | ICD-10-CM | POA: Diagnosis not present

## 2023-02-06 DIAGNOSIS — I129 Hypertensive chronic kidney disease with stage 1 through stage 4 chronic kidney disease, or unspecified chronic kidney disease: Secondary | ICD-10-CM | POA: Diagnosis not present

## 2023-02-06 DIAGNOSIS — N1831 Chronic kidney disease, stage 3a: Secondary | ICD-10-CM | POA: Diagnosis not present

## 2023-02-08 ENCOUNTER — Encounter (HOSPITAL_BASED_OUTPATIENT_CLINIC_OR_DEPARTMENT_OTHER): Payer: Medicare Other | Admitting: Internal Medicine

## 2023-02-08 DIAGNOSIS — L97815 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 without evidence of necrosis: Secondary | ICD-10-CM

## 2023-02-08 DIAGNOSIS — I70238 Atherosclerosis of native arteries of right leg with ulceration of other part of lower right leg: Secondary | ICD-10-CM | POA: Diagnosis not present

## 2023-02-08 DIAGNOSIS — T8131XA Disruption of external operation (surgical) wound, not elsewhere classified, initial encounter: Secondary | ICD-10-CM

## 2023-02-08 DIAGNOSIS — L97812 Non-pressure chronic ulcer of other part of right lower leg with fat layer exposed: Secondary | ICD-10-CM | POA: Diagnosis not present

## 2023-02-08 DIAGNOSIS — I87311 Chronic venous hypertension (idiopathic) with ulcer of right lower extremity: Secondary | ICD-10-CM | POA: Diagnosis not present

## 2023-02-10 NOTE — Progress Notes (Signed)
[name] ([id number]) [phone].pdf Page 1 of 9 Visit Report for 02/08/2023 Arrival Information Details Patient Name: Date of Service: [name] 02/08/2023 9:30 A M Medical Record Number: [id number] Patient Account Number: [id number] Date of Birth/Sex: Treating RN: 1951-02-09 (72 y.o. M) Primary Care Ilee Randleman: Maggie Font Other Clinician: Referring Daelen Belvedere: Treating Darrin Koman/Extender: Wilnette Kales in Treatment: 4 Visit Information History Since Last Visit All ordered tests and consults were completed: No Patient Arrived: Ambulatory Added or deleted any medications: No Arrival Time: 09:24 Any new allergies or adverse reactions: No Accompanied By: self Had a fall or experienced change in No Transfer Assistance: None activities of daily living that may affect Patient Identification Verified: Yes risk of falls: Secondary Verification Process Completed: Yes Signs or symptoms of abuse/neglect since last visito No Patient Requires Transmission-Based Precautions: No Hospitalized since last visit: No Patient Has Alerts: No Implantable device outside of the clinic excluding No cellular tissue based products placed in the center since last visit: Pain Present Now: No Electronic Signature(s) Signed: 02/08/2023 10:51:25 AM By: Worthy Rancher Entered By: Worthy Rancher on 02/08/2023 09:25:05 -------------------------------------------------------------------------------- Encounter Discharge Information Details Patient Name: Date of Service: [address] R. 02/08/2023 9:30 A M Medical Record Number: [id number] Patient Account Number: [id number] Date of Birth/Sex: Treating RN: 15-May-1951 (72 y.o. [name], Vincent Primary Care Brodi Kari: Maggie Font Other Clinician: Referring Herminio Kniskern: Treating Jaylynn Mcaleer/Extender: Wilnette Kales in Treatment: 4 Encounter Discharge Information Items  Post Procedure Vitals Discharge Condition: Stable Temperature (F): 98.7 Ambulatory Status: Ambulatory Pulse (bpm): 74 Discharge Destination: Home Respiratory Rate (breaths/min): 17 Transportation: Private Auto Blood Pressure (mmHg): 120/80 Accompanied By: self Schedule Follow-up Appointment: Yes Clinical Summary of Care: Patient Declined Electronic Signature(s) Signed: 02/09/2023 12:08:46 PM By: Rhae Hammock RN Entered By: Rhae Hammock on 02/08/2023 11:29:43 [place], [place] R ([id number] Page 2 of 9 -------------------------------------------------------------------------------- Lower Extremity Assessment Details Patient Name: Date of Service: [name] 02/08/2023 9:30 A M Medical Record Number: [id number] Patient Account Number: [id number] Date of Birth/Sex: Treating RN: 07-08-1951 (72 y.o. [name], Vincent Primary Care Gottlieb Zuercher: Maggie Font Other Clinician: Referring Richie Bonanno: Treating Brandell Maready/Extender: Wilnette Kales in Treatment: 4 Edema Assessment Assessed: [Left: No] [Right: Yes] Edema: [Left: Ye] [Right: s] Calf Left: Right: Point of Measurement: 42 cm From Medial Instep 43 cm Ankle Left: Right: Point of Measurement: 9 cm From Medial Instep 27 cm Vascular Assessment Pulses: Dorsalis Pedis Palpable: [Right:Yes] Posterior Tibial Palpable: [Right:Yes] Electronic Signature(s) Signed: 02/09/2023 12:08:46 PM By: Rhae Hammock RN Entered By: Rhae Hammock on 02/08/2023 09:44:15 -------------------------------------------------------------------------------- Multi Wound Chart Details Patient Name: Date of Service: [name] R. 02/08/2023 9:30 A M Medical Record Number: [id number] Patient Account Number: [id number] Date of Birth/Sex: Treating RN: 11/25/1951 (72 y.o. M) Primary Care Shawna Kiener: Maggie Font Other Clinician: Referring Jennilyn Esteve: Treating  Turner Kunzman/Extender: Wilnette Kales in Treatment: 4 Vital Signs Height(in): 71 Pulse(bpm): 88 Weight(lbs): 232 Blood Pressure(mmHg): 116/62 Body Mass Index(BMI): 32.4 Temperature(F): 98.1 Respiratory Rate(breaths/min): 20 [1:Photos:] [N/A:N/A [id number].pdf Page 3 of 9] Right, Anterior Lower Leg Right, Medial Lower Leg N/A Wound Location: Trauma Surgical Injury N/A Wounding Event: Arterial Insufficiency Ulcer Dehisced Wound N/A Primary Etiology: Hypertension, Peripheral Arterial Hypertension, Peripheral Arterial N/A Comorbid History: Disease, Peripheral Venous Disease Disease, Peripheral Venous Disease 09/13/2022 01/03/2023 N/A Date Acquired: 4 4 N/A Weeks of Treatment: Open Open N/A Wound Status:  No No N/A Wound Recurrence: 5x4.8x0.2 6.8x3.5x2.2 N/A Measurements L x W x D (cm) 18.85 18.692 N/A A (cm) : rea 3.77 41.123 N/A Volume (cm) : 89.70% -100.00% N/A % Reduction in A rea: 89.70% -1000.10% N/A % Reduction in Volume: Full Thickness With Exposed Support Full Thickness With Exposed Support N/A Classification: Structures Structures Large Medium N/A Exudate A mount: Serosanguineous Serosanguineous N/A Exudate Type: red, brown red, brown N/A Exudate Color: Distinct, outline attached Distinct, outline attached N/A Wound Margin: Medium (34-66%) Medium (34-66%) N/A Granulation A mount: Red, Pink Red, Hyper-granulation, Friable N/A Granulation Quality: Medium (34-66%) Medium (34-66%) N/A Necrotic A mount: Fat Layer (Subcutaneous Tissue): Yes Fat Layer (Subcutaneous Tissue): Yes N/A Exposed Structures: Fascia: No Fascia: No Tendon: No Tendon: No Muscle: No Muscle: No Joint: No Joint: No Bone: No Bone: No Small (1-33%) Small (1-33%) N/A Epithelialization: Debridement - Selective/Open Wound Debridement - Excisional N/A Debridement: Pre-procedure Verification/Time Out 09:56 09:56 N/A Taken: Lidocaine  Lidocaine N/A Pain Control: Slough Muscle, Subcutaneous, Slough N/A Tissue Debrided: Non-Viable Tissue Skin/Subcutaneous Tissue/Muscle N/A Level: 24 23.8 N/A Debridement A (sq cm): rea Curette Curette N/A Instrument: Minimum Minimum N/A Bleeding: Pressure Pressure N/A Hemostasis A chieved: 0 0 N/A Procedural Pain: 0 0 N/A Post Procedural Pain: Procedure was tolerated well Procedure was tolerated well N/A Debridement Treatment Response: 5x4.8x0.2 6.8x3.5x2.2 N/A Post Debridement Measurements L x W x D (cm) 3.77 41.123 N/A Post Debridement Volume: (cm) Scarring: Yes Scarring: Yes N/A Periwound Skin Texture: Excoriation: No Excoriation: No Induration: No Induration: No Callus: No Callus: No Crepitus: No Crepitus: No Rash: No Rash: No Maceration: No Maceration: Yes N/A Periwound Skin Moisture: Dry/Scaly: No Dry/Scaly: No Erythema: Yes Erythema: Yes N/A Periwound Skin Color: Atrophie Blanche: No Atrophie Blanche: No Cyanosis: No Cyanosis: No Ecchymosis: No Ecchymosis: No Hemosiderin Staining: No Hemosiderin Staining: No Mottled: No Mottled: No Pallor: No Pallor: No Rubor: No Rubor: No Circumferential Circumferential N/A Erythema Location: No Abnormality No Abnormality N/A Temperature: Yes Yes N/A Tenderness on Palpation: Debridement Debridement N/A Procedures Performed: Treatment Notes Electronic Signature(s) Signed: 02/08/2023 12:26:40 PM By: Kalman Shan DO Entered By: Kalman Shan on 02/08/2023 10:06:09 Danford Bad ([id number] Page 4 of 9 -------------------------------------------------------------------------------- Multi-Disciplinary Care Plan Details Patient Name: Date of Service: [name] 02/08/2023 9:30 A M Medical Record Number: [id number] Patient Account Number: [id number] Date of Birth/Sex: Treating RN: [birth year] (72 y.o. [name], Vincent Primary Care  Geraline Halberstadt: Maggie Font Other Clinician: Referring Nirvana Blanchett: Treating Quinlynn Cuthbert/Extender: Wilnette Kales in Treatment: 4 Active Inactive Wound/Skin Impairment Nursing Diagnoses: Impaired tissue integrity Knowledge deficit related to ulceration/compromised skin integrity Goals: Patient will have a decrease in wound volume by X% from date: (specify in notes) Date Initiated: 01/11/2023 Target Resolution Date: 03/23/2023 Goal Status: Active Patient/caregiver will verbalize understanding of skin care regimen Date Initiated: 01/11/2023 Date Inactivated: 01/30/2023 Target Resolution Date: 02/03/2023 Goal Status: Met Ulcer/skin breakdown will have a volume reduction of 30% by week 4 Date Initiated: 01/11/2023 Target Resolution Date: 02/15/2023 Goal Status: Active Interventions: Assess patient/caregiver ability to obtain necessary supplies Assess patient/caregiver ability to perform ulcer/skin care regimen upon admission and as needed Assess ulceration(s) every visit Notes: Electronic Signature(s) Signed: 02/09/2023 12:08:46 PM By: Rhae Hammock RN Entered By: Rhae Hammock on 02/08/2023 09:45:15 -------------------------------------------------------------------------------- Negative Pressure Wound Therapy Application (NPWT) Details Patient Name: Date of Service: [name]. 02/08/2023 9:30 A M Medical Record Number: [id number] Patient Account Number: [id number] Date of Birth/Sex: Treating  RN: 14-Oct-1951 (72 y.o. [name], Vincent Primary Care Saahir Prude: Maggie Font Other Clinician: Referring Breyon Blass: Treating Anniyah Mood/Extender: Wilnette Kales in Treatment: 4 NPWT Application Performed for: Wound #2 Right, Medial Lower Leg Additional Injuries Covered: No Performed By: Rhae Hammock, RN Type: VAC System Coverage Size (sq cm): 23.8 Pressure Type: Constant Pressure Setting: 125 mmHG Drain Type: None Primary  Contact: Non-Adherent Quantity of Sponges/Gauze Inserted: 1 black foam Sponge/Dressing Type: Foam, Black Date Initiated: 02/08/2023 DOMONIC, BUCHINGER ([id number]) [phone].pdf Page 5 of 9 Response to Treatment: tolerates well Post Procedure Diagnosis Same as Pre-procedure Electronic Signature(s) Signed: 02/09/2023 12:08:46 PM By: Rhae Hammock RN Entered By: Rhae Hammock on 02/08/2023 11:00:17 -------------------------------------------------------------------------------- Pain Assessment Details Patient Name: Date of Service: [name] R. 02/08/2023 9:30 A M Medical Record Number: [id number] Patient Account Number: [id number] Date of Birth/Sex: Treating RN: 07-Mar-1951 (72 y.o. M) Primary Care Coraline Talwar: Maggie Font Other Clinician: Referring Holley Wirt: Treating Anatasia Tino/Extender: Wilnette Kales in Treatment: 4 Active Problems Location of Pain Severity and Description of Pain Patient Has Paino Yes Site Locations Rate the pain. Current Pain Level: 6 Worst Pain Level: 10 Least Pain Level: 0 Tolerable Pain Level: 4 Pain Management and Medication Current Pain Management: Electronic Signature(s) Signed: 02/08/2023 10:51:25 AM By: Worthy Rancher Entered By: Worthy Rancher on 02/08/2023 09:26:14 -------------------------------------------------------------------------------- Patient/Caregiver Education Details Patient Name: Date of Service: [name] 2/15/2024andnbsp9:30 A M Medical Record Number: [id number] Patient Account Number: [id number] Date of Birth/Gender: Treating RN: 07-22-1951 (72 y.o. Erie Noe Primary Care Physician: Maggie Font Other Clinician: Referring Physician: Treating Physician/Extender: Wilnette Kales in Treatment: [address] [place] ([id number]) [id number] Page 6 of 9 Education Assessment Education Provided  To: Patient Education Topics Provided Wound/Skin Impairment: Methods: Explain/Verbal Responses: Reinforcements needed, State content correctly Electronic Signature(s) Signed: 02/09/2023 12:08:46 PM By: Rhae Hammock RN Entered By: Rhae Hammock on 02/08/2023 09:45:25 -------------------------------------------------------------------------------- Wound Assessment Details Patient Name: Date of Service: [name] R. 02/08/2023 9:30 A M Medical Record Number: [id number] Patient Account Number: [id number] Date of Birth/Sex: Treating RN: July 28, 1951 (72 y.o. M) Primary Care Ramie Palladino: Maggie Font Other Clinician: Referring Phenix Grein: Treating Emilyanne Mcgough/Extender: Wilnette Kales in Treatment: 4 Wound Status Wound Number: 1 Primary Arterial Insufficiency Ulcer Etiology: Wound Location: Right, Anterior Lower Leg Wound Status: Open Wounding Event: Trauma Comorbid Hypertension, Peripheral Arterial Disease, Peripheral Venous Date Acquired: 09/13/2022 History: Disease Weeks Of Treatment: 4 Clustered Wound: No Photos Wound Measurements Length: (cm) 5 Width: (cm) 4.8 Depth: (cm) 0.2 Area: (cm) 18.85 Volume: (cm) 3.77 % Reduction in Area: 89.7% % Reduction in Volume: 89.7% Epithelialization: Small (1-33%) Wound Description Classification: Full Thickness With Exposed Support Structures Wound Margin: Distinct, outline attached Exudate Amount: Large Exudate Type: Serosanguineous Exudate Color: red, brown Foul Odor After Cleansing: No Slough/Fibrino Yes Wound Bed Granulation Amount: Medium (34-66%) Exposed Structure Granulation Quality: Red, Pink Fascia Exposed: No ALARICK, GUFFIN R ([id number]) [phone].pdf Page 7 of 9 Necrotic Amount: Medium (34-66%) Fat Layer (Subcutaneous Tissue) Exposed: Yes Necrotic Quality: Adherent Slough Tendon Exposed: No Muscle Exposed: No Joint Exposed: No Bone Exposed:  No Periwound Skin Texture Texture Color No Abnormalities Noted: No No Abnormalities Noted: No Callus: No Atrophie Blanche: No Crepitus: No Cyanosis: No Excoriation: No Ecchymosis: No Induration: No Erythema: Yes Rash: No Erythema Location: Circumferential Scarring: Yes Hemosiderin Staining: No Mottled: No Moisture Pallor: No No Abnormalities Noted: Yes Rubor:  No Temperature / Pain Temperature: No Abnormality Tenderness on Palpation: Yes Treatment Notes Wound #1 (Lower Leg) Wound Laterality: Right, Anterior Cleanser Soap and Water Discharge Instruction: May shower and wash wound with dial antibacterial soap and water prior to dressing change. Wound Cleanser Discharge Instruction: Cleanse the wound with wound cleanser prior to applying a clean dressing using gauze sponges, not tissue or cotton balls. Peri-Wound Care Topical Keystone Primary Dressing PolyMem Non-Adhesive Dressing, 4x4 in Discharge Instruction: Apply to wound bed as instructed Keystone topical compounding antibiotics Discharge Instruction: applied directly to wound bed. Secondary Dressing ABD Pad, 5x9 Discharge Instruction: Apply over primary dressing as directed. Zetuvit Plus 4x8 in Discharge Instruction: Apply over primary dressing as directed. Secured With [employer] Size 5, 10 (yds) Compression Wrap Kerlix Roll 4.5x3.1 (in/yd) Discharge Instruction: Apply Kerlix and Coban compression as directed. Coban Self-Adherent Wrap 4x5 (in/yd) Discharge Instruction: Apply over Kerlix as directed. Compression Stockings Add-Ons Electronic Signature(s) Signed: 02/08/2023 10:51:25 AM By: Worthy Rancher Entered By: Worthy Rancher on 02/08/2023 09:38:32 VANE, [place] ([id number].pdf Page 8 of 9 -------------------------------------------------------------------------------- Wound Assessment Details Patient Name: Date of Service: [name] 02/08/2023 9:30 A  M Medical Record Number: [id number] Patient Account Number: [id number] Date of Birth/Sex: Treating RN: Sep 04, 1951 (72 y.o. M) Primary Care Carolyne Whitsel: Maggie Font Other Clinician: Referring Lyndsey Demos: Treating Latima Hamza/Extender: Wilnette Kales in Treatment: 4 Wound Status Wound Number: 2 Primary Dehisced Wound Etiology: Wound Location: Right, Medial Lower Leg Wound Status: Open Wounding Event: Surgical Injury Comorbid Hypertension, Peripheral Arterial Disease, Peripheral Venous Date Acquired: 01/03/2023 History: Disease Weeks Of Treatment: 4 Clustered Wound: No Photos Wound Measurements Length: (cm) 6.8 Width: (cm) 3.5 Depth: (cm) 2.2 Area: (cm) 18.692 Volume: (cm) 41.123 % Reduction in Area: -100% % Reduction in Volume: -1000.1% Epithelialization: Small (1-33%) Wound Description Classification: Full Thickness With Exposed Suppor Wound Margin: Distinct, outline attached Exudate Amount: Medium Exudate Type: Serosanguineous Exudate Color: red, brown t Structures Foul Odor After Cleansing: No Slough/Fibrino Yes Wound Bed Granulation Amount: Medium (34-66%) Exposed Structure Granulation Quality: Red, Hyper-granulation, Friable Fascia Exposed: No Necrotic Amount: Medium (34-66%) Fat Layer (Subcutaneous Tissue) Exposed: Yes Necrotic Quality: Adherent Slough Tendon Exposed: No Muscle Exposed: No Joint Exposed: No Bone Exposed: No Periwound Skin Texture Texture Color No Abnormalities Noted: No No Abnormalities Noted: No Callus: No Atrophie Blanche: No Crepitus: No Cyanosis: No Excoriation: No Ecchymosis: No Induration: No Erythema: Yes Rash: No Erythema Location: Circumferential Scarring: Yes Hemosiderin Staining: No Mottled: No Moisture Pallor: No No Abnormalities Noted: No Rubor: No Dry / Scaly: No Maceration: Yes Temperature / Pain Temperature: No Abnormality Tenderness on Palpation: Yes Treatment Notes Wound #2 (Lower Leg)  Wound Laterality: Right, Medial GEDDY, PALUZZI R ([id number]) [id number] Page 9 of 9 Cleanser Soap and Water Discharge Instruction: May shower and wash wound with dial antibacterial soap and water prior to dressing change. Wound Cleanser Discharge Instruction: Cleanse the wound with wound cleanser prior to applying a clean dressing using gauze sponges, not tissue or cotton balls. Peri-Wound Care Topical Primary Dressing Keystone topical compounding antibiotics Discharge Instruction: applied directly to wound bed. CONTINUE KEYSTONE TOPICAL ANTIBIOTICS AND HYDROFERA BLUE UNTIL THE WOUND VAC ARRIVES. ONCE ARRIVES APPLY WHITE AND BLACK FOAM TO THE MEDIAL LOWER LEG. wound vac Secondary Dressing ABD Pad, 5x9 Discharge Instruction: Apply over primary dressing as directed. Woven Gauze Sponge, Non-Sterile 4x4 in Discharge Instruction: APPLY OVER THE HYDROFERA BLUE TO ENSURE CONTACT WITH WOUND BED. Zetuvit Plus 4x8 in Discharge Instruction:  Apply over primary dressing as directed. Secured With [employer] Group Size 5, 10 (yds) Compression Wrap Kerlix Roll 4.5x3.1 (in/yd) Discharge Instruction: Apply Kerlix and Coban compression as directed. Coban Self-Adherent Wrap 4x5 (in/yd) Discharge Instruction: Apply over Kerlix as directed. Compression Stockings Add-Ons Electronic Signature(s) Signed: 02/08/2023 10:51:25 AM By: Worthy Rancher Entered By: Worthy Rancher on 02/08/2023 09:39:03 -------------------------------------------------------------------------------- Vitals Details Patient Name: Date of Service: [name] R. 02/08/2023 9:30 A M Medical Record Number: [id number] Patient Account Number: [id number] Date of Birth/Sex: Treating RN: 04/23/1951 (72 y.o. M) Primary Care Azarah Dacy: Maggie Font Other Clinician: Referring Livian Vanderbeck: Treating Aaliya Maultsby/Extender: Wilnette Kales in Treatment: 4 Vital Signs Time Taken:  09:25 Temperature (F): 98.1 Height (in): 71 Pulse (bpm): 88 Weight (lbs): 232 Respiratory Rate (breaths/min): 20 Body Mass Index (BMI): 32.4 Blood Pressure (mmHg): 116/62 Reference Range: 80 - 120 mg / dl Electronic Signature(s) Signed: 02/08/2023 10:51:25 AM By: Worthy Rancher Entered By: Worthy Rancher on 02/08/2023 09:25:58

## 2023-02-10 NOTE — Progress Notes (Signed)
MCKALE, WINBORN ([id number]) [id number] Page 1 of 11 Visit Report for 02/08/2023 Chief Complaint Document Details Patient Name: Date of Service: [name] 02/08/2023 9:30 A M Medical Record Number: [id number] Patient Account Number: [id number] Date of Birth/Sex: Treating RN: [birth year] (72 y.o. M) Primary Care Provider: Maggie Font Other Clinician: Referring Provider: Treating Provider/Extender: Wilnette Kales in Treatment: 4 Information Obtained from: Patient Chief Complaint 01/11/2023; arterial right lower extremity wound, surgical dehiscence to the medial right lower extremity Electronic Signature(s) Signed: 02/08/2023 12:26:40 PM By: Kalman Shan DO Entered By: Kalman Shan on 02/08/2023 10:06:17 -------------------------------------------------------------------------------- Debridement Details Patient Name: Date of Service: [name] 02/08/2023 9:30 A M Medical Record Number: [id number] Patient Account Number: [id number] Date of Birth/Sex: Treating RN: 21-Jan-1951 (72 y.o. [name], [name] Primary Care Provider: Maggie Font Other Clinician: Referring Provider: Treating Provider/Extender: Wilnette Kales in Treatment: 4 Debridement Performed for Assessment: Wound #1 Right,Anterior Lower Leg Performed By: Physician Kalman Shan, DO Debridement Type: Debridement Severity of Tissue Pre Debridement: Fat layer exposed Level of Consciousness (Pre-procedure): Awake and Alert Pre-procedure Verification/Time Out Yes - 09:56 Taken: Start Time: 09:56 Pain Control: Lidocaine T Area Debrided (L x W): otal 5 (cm) x 4.8 (cm) = 24 (cm) Tissue and other material debrided: Viable, Non-Viable, Slough, Slough Level: Non-Viable Tissue Debridement Description: Selective/Open Wound Instrument: Curette Bleeding: Minimum Hemostasis Achieved: Pressure End Time:  09:56 Procedural Pain: 0 Post Procedural Pain: 0 Response to Treatment: Procedure was tolerated well Level of Consciousness (Post- Awake and Alert procedure): Post Debridement Measurements of Total Wound Length: (cm) 5 Width: (cm) 4.8 Depth: (cm) 0.2 Volume: (cm) 3.77 Character of Wound/Ulcer Post Debridement: Improved Severity of Tissue Post Debridement: Fat layer exposed GABREIL, KILNER R ([id number]) [id number] Page 2 of 11 Post Procedure Diagnosis Same as Pre-procedure Electronic Signature(s) Signed: 02/08/2023 12:26:40 PM By: Kalman Shan DO Signed: 02/09/2023 12:08:46 PM By: Rhae Hammock RN Entered By: Rhae Hammock on 02/08/2023 09:57:46 -------------------------------------------------------------------------------- Debridement Details Patient Name: Date of Service: [name] 02/08/2023 9:30 A M Medical Record Number: [id number] Patient Account Number: [id number] Date of Birth/Sex: Treating RN: 08-21-1951 (72 y.o. [name], [name] Primary Care Provider: Maggie Font Other Clinician: Referring Provider: Treating Provider/Extender: Wilnette Kales in Treatment: 4 Debridement Performed for Assessment: Wound #2 Right,Medial Lower Leg Performed By: Physician Kalman Shan, DO Debridement Type: Debridement Level of Consciousness (Pre-procedure): Awake and Alert Pre-procedure Verification/Time Out Yes - 09:56 Taken: Start Time: 09:56 Pain Control: Lidocaine T Area Debrided (L x W): otal 6.8 (cm) x 3.5 (cm) = 23.8 (cm) Tissue and other material debrided: Viable, Non-Viable, Muscle, Slough, Subcutaneous, Slough Level: Skin/Subcutaneous Tissue/Muscle Debridement Description: Excisional Instrument: Curette Bleeding: Minimum Hemostasis Achieved: Pressure End Time: 09:56 Procedural Pain: 0 Post Procedural Pain: 0 Response to Treatment: Procedure was tolerated well Level of  Consciousness (Post- Awake and Alert procedure): Post Debridement Measurements of Total Wound Length: (cm) 6.8 Width: (cm) 3.5 Depth: (cm) 2.2 Volume: (cm) 41.123 Character of Wound/Ulcer Post Debridement: Improved Post Procedure Diagnosis Same as Pre-procedure Electronic Signature(s) Signed: 02/08/2023 12:26:40 PM By: Kalman Shan DO Signed: 02/09/2023 12:08:46 PM By: Rhae Hammock RN Entered By: Rhae Hammock on 02/08/2023 10:02:38 -------------------------------------------------------------------------------- HPI Details Patient Name: Date of Service: [name] 02/08/2023 9:30 A M Medical Record Number: [id number] Patient Account Number: [id number] DERIEL, CAVANAH ([id number]) [id number] Page 3 of 11 Date of  Birth/Sex: Treating RN: 23-Aug-1951 (72 y.o. M) Primary Care Provider: Maggie Font Other Clinician: Referring Provider: Treating Provider/Extender: Wilnette Kales in Treatment: 4 History of Present Illness HPI Description: 01/11/2023 Mr. Donnie Ghilardi is a 72 year old male with a past medical history of peripheral arterial disease status post femoropopliteal bypass graft on 12/12/2022 and prostate cancer that presents to the clinic with 2 wounds to his right lower extremity. He states that in October 2023 he was wearing a boot that rubbed into his leg creating a wound. Since the wound was not healing he had ABIs completed that showed a right ABI of 0.35 and TBI of 0. [facility], Dr. Donnetta Hutching who did a femoropopliteal bypass graft on the right on 12/12/2022. He reports chronic pain to the wound site. He reports having staples removed to the medial right leg 1 week ago and the wound site has dehisced. He has not been dressing either of the wound beds. He currently denies systemic signs of infection. 1/25; patient presents for follow-up. He followed up with vein and vascular yesterday.  It was noted that he had excellent biphasic flow in the posterior tibial of the right lower extremity. He has been using Hydrofera Blue and Santyl to the wound beds. He has chronic pain to the wound sites. 2/1; patient presents for follow-up. Patient had a PCR culture done at last clinic visit that showed E. coli, coagulase-negative staph, Candida parapsilosis and actinotignum schaalii. He is currently on ciprofloxacin that covers the E. coli. I will switch him over to Augmentin. We ordered [employer] antibiotic ointment and patient has paid for this and states it is coming in the mail tomorrow. For now he has been using Vashe wet-to-dry dressings. 2/6; patient presents for follow-up. He obtained the [facility] antibiotic in the mail. He started this Saturday. He has had no issues with it. He also continues to take Augmentin. He has a few days left. He denies systemic signs of infection. 2/8; patient presents for follow-up. He had a wrap placed at last clinic visit and he tolerated this well. We have been using Keystone antibiotic ointment with Hydrofera Blue under Kerlix/Coban. We have ordered the wound VAC but have not heard back for insurance approval. He currently denies systemic signs of infection. 2/15; patient presents for follow-up. We have been using Hydrofera Blue and Keystone antibiotic ointment to the right lower extremity wounds under Kerlix/Coban. He has the wound VAC with him today. Unfortunately insurance is not approving home health. He will come in our clinic to have the [place] changed. Electronic Signature(s) Signed: 02/08/2023 12:26:40 PM By: Kalman Shan DO Entered By: Kalman Shan on 02/08/2023 10:07:33 -------------------------------------------------------------------------------- Physical Exam Details Patient Name: Date of Service: [name] 02/08/2023 9:30 A M Medical Record Number: [id number] Patient Account Number: [id number] Date of Birth/Sex: Treating  RN: March 16, 1951 (72 y.o. M) Primary Care Provider: Maggie Font Other Clinician: Referring Provider: Treating Provider/Extender: Wilnette Kales in Treatment: 4 Constitutional respirations regular, non-labored and within target range for patient.. Cardiovascular 2+ dorsalis pedis/posterior tibialis pulses. Psychiatric pleasant and cooperative. Notes Right lower extremity: T the anterior distal aspect there is an open wound with nonviable surface and granulation tissue. T the medial right leg there is an o o incision site with a large dehisced wound with non viable tissue and granulation tissue. no purulent drainage. 2+ pitting edema to the knee. No surrounding signs of soft tissue infection to any  of the wound beds. Electronic Signature(s) Signed: 02/08/2023 12:26:40 PM By: Kalman Shan DO Entered By: Kalman Shan on 02/08/2023 10:07:56 Danford Bad ([id number] Page 4 of 11 -------------------------------------------------------------------------------- Physician Orders Details Patient Name: Date of Service: [name] 02/08/2023 9:30 A M Medical Record Number: [id number] Patient Account Number: [id number] Date of Birth/Sex: Treating RN: 07-18-1951 (72 y.o. [name], [name] Primary Care Provider: Maggie Font Other Clinician: Referring Provider: Treating Provider/Extender: Wilnette Kales in Treatment: 4 Verbal / Phone Orders: No Diagnosis Coding Follow-up Appointments ppointment in 1 week. - ***EXTRA TIME BLOCK EXTRA ROOM D/T WOUND VAC AND EXTENSIVE WOUND CARE*** Return A w/ Dr. Cheron Every Rm # 9 02/15/2023 0930 ppointment in 2 weeks. - ***EXTRA TIME BLOCK EXTRA ROOM D/T WOUND VAC AND EXTENSIVE WOUND CARE*** Return A w/ Dr. [place] and [place] Rm # 9 02/22/23 @ 9:30 Nurse Visit: - Monday 02/19 @ 12:30 Rm # 7 MONDAY 02/26 @ 8:00 rM # 7 Other: - Bring in  topical antibiotics to each appt time. Bring wound vac supplies every appt. Anesthetic (In clinic) Topical Lidocaine 5% applied to wound bed Cellular or Tissue Based Products Wound #1 Right,Anterior Lower Leg Cellular or Tissue Based Product Type: - 02/01/2023 [employer] insurance for approval. Bathing/ Shower/ Hygiene May shower with protection but do not get wound dressing(s) wet. Protect dressing(s) with water repellant cover (for example, large plastic bag) or a cast cover and may then take shower. - May wash wounds with dial antibacterial soap in the shower if you immediately dress wound after shower. Negative Presssure Wound Therapy Wound #2 Right,Medial Lower Leg Wound Vac to wound continuously at 173m/hg pressure - change twice a week at wound care center unless home health approved by insurance. Nurse visits every Monday, wound care encounters every Thursday Black and White Foam combination Edema Control - Lymphedema / SCD / Other Elevate legs to the level of the heart or above for 30 minutes daily and/or when sitting for 3-4 times a day throughout the day. Avoid standing for long periods of time. Additional Orders / Instructions Follow Nutritious Diet - increase protein intake with meals, drink boost or Glucerna, and use Juven. [facility]. May utilize formulary equivalent dressing for wound treatment orders unless otherwise specified. New wound care orders this week; continue Home Health for wound care. May utilize formulary equivalent dressing for wound treatment orders unless otherwise specified. - both wounds topical compounding antibiotics hydrofera blue abd pad, kerlix and coban until wound vac arrives next week then apply it to right medial lower leg. Continue the topical antibiotics hydrofera blue to right circumferential lower leg kerlix coban. Dressing changes to be completed by [facility] / Thursday  / Saturday except when [facility] Other Home Health Orders/Instructions: Wound Treatment Wound #1 - Lower Leg Wound Laterality: Right, Anterior Cleanser: Soap and Water 3 x Per Week/30 Days Discharge Instructions: May shower and wash wound with dial antibacterial soap and water prior to dressing change. Cleanser: Wound Cleanser (Generic) 3 x Per Week/30 Days Discharge Instructions: Cleanse the wound with wound cleanser prior to applying a clean dressing using gauze sponges, not tissue or cotton balls. Topical: Keystone 3 x Per Week/30 Days Prim Dressing: PolyMem Non-Adhesive Dressing, 4x4 in 3 x Per Week/30 Days ary Discharge Instructions: Apply to wound bed as instructed ROLTON, PREJEAN([id number] Page 5  of 11 Prim Dressing: Keystone topical compounding antibiotics 3 x Per Week/30 Days ary Discharge Instructions: applied directly to wound bed. Secondary Dressing: ABD Pad, 5x9 (Generic) 3 x Per Week/30 Days Discharge Instructions: Apply over primary dressing as directed. Secondary Dressing: Zetuvit Plus 4x8 in 3 x Per Week/30 Days Discharge Instructions: Apply over primary dressing as directed. Secured With: [employer] Size 5, 10 (yds) (Generic) 3 x Per Week/30 Days Compression Wrap: Kerlix Roll 4.5x3.1 (in/yd) 3 x Per Week/30 Days Discharge Instructions: Apply Kerlix and Coban compression as directed. Compression Wrap: Coban Self-Adherent Wrap 4x5 (in/yd) 3 x Per Week/30 Days Discharge Instructions: Apply over Kerlix as directed. Wound #2 - Lower Leg Wound Laterality: Right, Medial Cleanser: Soap and Water 3 x Per Week/30 Days Discharge Instructions: May shower and wash wound with dial antibacterial soap and water prior to dressing change. Cleanser: Wound Cleanser (Generic) 3 x Per Week/30 Days Discharge Instructions: Cleanse the wound with wound cleanser prior to applying a clean dressing using gauze  sponges, not tissue or cotton balls. Prim Dressing: Keystone topical compounding antibiotics 3 x Per Week/30 Days ary Discharge Instructions: applied directly to wound bed. Prim Dressing: CONTINUE KEYSTONE TOPICAL ANTIBIOTICS AND HYDROFERA BLUE UNTIL THE WOUND VAC ARRIVES. ONCE ARRIVES APPLY ary WHITE AND BLACK FOAM TO THE MEDIAL LOWER LEG. 3 x Per Week/30 Days Prim Dressing: wound vac ary 3 x Per Week/30 Days Secondary Dressing: ABD Pad, 5x9 (Generic) 3 x Per Week/30 Days Discharge Instructions: Apply over primary dressing as directed. Secondary Dressing: Woven Gauze Sponge, Non-Sterile 4x4 in 3 x Per Week/30 Days Discharge Instructions: APPLY OVER THE HYDROFERA BLUE TO ENSURE CONTACT WITH WOUND BED. Secondary Dressing: Zetuvit Plus 4x8 in 3 x Per Week/30 Days Discharge Instructions: Apply over primary dressing as directed. Secured With: [employer] Size 5, 10 (yds) (Generic) 3 x Per Week/30 Days Compression Wrap: Kerlix Roll 4.5x3.1 (in/yd) 3 x Per Week/30 Days Discharge Instructions: Apply Kerlix and Coban compression as directed. Compression Wrap: Coban Self-Adherent Wrap 4x5 (in/yd) 3 x Per Week/30 Days Discharge Instructions: Apply over Kerlix as directed. Electronic Signature(s) Signed: 02/08/2023 12:26:40 PM By: Kalman Shan DO Signed: 02/09/2023 12:08:46 PM By: Rhae Hammock RN Entered By: Rhae Hammock on 02/08/2023 10:17:16 -------------------------------------------------------------------------------- Problem List Details Patient Name: Date of Service: [name] 02/08/2023 9:30 A M Medical Record Number: [id number] Patient Account Number: [id number] Date of Birth/Sex: Treating RN: 1951-11-27 (72 y.o. M) Primary Care Provider: Maggie Font Other Clinician: Referring Provider: Treating Provider/Extender: Wilnette Kales in Treatment: [address] JACOBO, CEPERO R ([id number]) [id number]  Page 6 of 11 ICD-10 Encounter Code Description Active Date MDM Diagnosis L97.815 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 02/01/2023 No Yes involvement without evidence of necrosis L97.812 Non-pressure chronic ulcer of other part of right lower leg with fat layer 02/01/2023 No Yes exposed I70.238 Atherosclerosis of native arteries of right leg with ulceration of other part of 01/11/2023 No Yes lower leg I87.311 Chronic venous hypertension (idiopathic) with ulcer of right lower extremity 02/01/2023 No Yes T81.31XA Disruption of external operation (surgical) wound, not elsewhere classified, 01/11/2023 No Yes initial encounter Inactive Problems Resolved Problems Electronic Signature(s) Signed: 02/08/2023 12:26:40 PM By: Kalman Shan DO Entered By: Kalman Shan on 02/08/2023 10:06:04 -------------------------------------------------------------------------------- Progress Note Details Patient Name: Date of Service: [name] 02/08/2023 9:30 A M Medical Record Number: [id number] Patient Account Number: [id number] Date of Birth/Sex: Treating RN: [birth year] (  72 y.o. M) Primary Care Provider: Maggie Font Other Clinician: Referring Provider: Treating Provider/Extender: Wilnette Kales in Treatment: 4 Subjective Chief Complaint Information obtained from Patient 01/11/2023; arterial right lower extremity wound, surgical dehiscence to the medial right lower extremity History of Present Illness (HPI) 01/11/2023 Mr. Hovannes Mcmanigal is a 72 year old male with a past medical history of peripheral arterial disease status post femoropopliteal bypass graft on 12/12/2022 and prostate cancer that presents to the clinic with 2 wounds to his right lower extremity. He states that in October 2023 he was wearing a boot that rubbed into his leg creating a wound. Since the wound was not healing he had ABIs completed that showed a right ABI  of 0.35 and TBI of 0. [facility], Dr. Donnetta Hutching who did a femoropopliteal bypass graft on the right on 12/12/2022. He reports chronic pain to the wound site. He reports having staples removed to the medial right leg 1 week ago and the wound site has dehisced. He has not been dressing either of the wound beds. He currently denies systemic signs of infection. 1/25; patient presents for follow-up. He followed up with vein and vascular yesterday. It was noted that he had excellent biphasic flow in the posterior tibial of the right lower extremity. He has been using Hydrofera Blue and Santyl to the wound beds. He has chronic pain to the wound sites. 2/1; patient presents for follow-up. Patient had a PCR culture done at last clinic visit that showed E. coli, coagulase-negative staph, Candida parapsilosis and actinotignum schaalii. He is currently on ciprofloxacin that covers the E. coli. I will switch him over to Augmentin. We ordered [facility] antibiotic ointment and patient has paid for this and states it is coming in the mail tomorrow. For now he has been using Vashe wet-to-dry dressings. 2/6; patient presents for follow-up. He obtained the [facility] antibiotic in the mail. He started this Saturday. He has had no issues with it. He also continues to take Augmentin. He has a few days left. He denies systemic signs of infection. MAUREEN, HAAGEN ([id number]) [id number] Page 7 of 11 2/8; patient presents for follow-up. He had a wrap placed at last clinic visit and he tolerated this well. We have been using Keystone antibiotic ointment with Hydrofera Blue under Kerlix/Coban. We have ordered the wound VAC but have not heard back for insurance approval. He currently denies systemic signs of infection. 2/15; patient presents for follow-up. We have been using Hydrofera Blue and Keystone antibiotic ointment to the right lower extremity wounds under Kerlix/Coban. He has the  wound VAC with him today. Unfortunately insurance is not approving home health. He will come in our clinic to have the [place] changed. Patient History Information obtained from Patient, Chart. Family History Unknown History. Social History Former smoker, Marital Status - Single, Alcohol Use - Never, Drug Use - No History, Caffeine Use - Rarely. Medical History Cardiovascular Patient has history of Hypertension, Peripheral Arterial Disease, Peripheral Venous Disease Hospitalization/Surgery History - femoral-tibial bypass graft right 12/12/22. - radical prostatectomy 2011. - tonsillectomy age 67. Medical A Surgical History Notes nd Cardiovascular Hyperlipidemia Gastrointestinal GERD Oncologic prostate ca Objective Constitutional respirations regular, non-labored and within target range for patient.. Vitals Time Taken: 9:25 AM, Height: 71 in, Weight: 232 lbs, BMI: 32.4, Temperature: 98.1 F, Pulse: 88 bpm, Respiratory Rate: 20 breaths/min, Blood Pressure: 116/62 mmHg. Cardiovascular 2+ dorsalis pedis/posterior tibialis pulses. Psychiatric pleasant and cooperative. General Notes: Right lower extremity: T the anterior  distal aspect there is an open wound with nonviable surface and granulation tissue. T the medial right leg o o there is an incision site with a large dehisced wound with non viable tissue and granulation tissue. no purulent drainage. 2+ pitting edema to the knee. No surrounding signs of soft tissue infection to any of the wound beds. Integumentary (Hair, Skin) Wound #1 status is Open. Original cause of wound was Trauma. The date acquired was: 09/13/2022. The wound has been in treatment 4 weeks. The wound is located on the Right,Anterior Lower Leg. The wound measures 5cm length x 4.8cm width x 0.2cm depth; 18.85cm^2 area and 3.77cm^3 volume. There is Fat Layer (Subcutaneous Tissue) exposed. There is a large amount of serosanguineous drainage noted. The wound margin is  distinct with the outline attached to the wound base. There is medium (34-66%) red, pink granulation within the wound bed. There is a medium (34-66%) amount of necrotic tissue within the wound bed including Adherent Slough. The periwound skin appearance had no abnormalities noted for moisture. The periwound skin appearance exhibited: Scarring, Erythema. The periwound skin appearance did not exhibit: Callus, Crepitus, Excoriation, Induration, Rash, Atrophie Blanche, Cyanosis, Ecchymosis, Hemosiderin Staining, Mottled, Pallor, Rubor. The surrounding wound skin color is noted with erythema which is circumferential. Periwound temperature was noted as No Abnormality. The periwound has tenderness on palpation. Wound #2 status is Open. Original cause of wound was Surgical Injury. The date acquired was: 01/03/2023. The wound has been in treatment 4 weeks. The wound is located on the Right,Medial Lower Leg. The wound measures 6.8cm length x 3.5cm width x 2.2cm depth; 18.692cm^2 area and 41.123cm^3 volume. There is Fat Layer (Subcutaneous Tissue) exposed. There is a medium amount of serosanguineous drainage noted. The wound margin is distinct with the outline attached to the wound base. There is medium (34-66%) red, friable, hyper - granulation within the wound bed. There is a medium (34-66%) amount of necrotic tissue within the wound bed including Adherent Slough. The periwound skin appearance exhibited: Scarring, Maceration, Erythema. The periwound skin appearance did not exhibit: Callus, Crepitus, Excoriation, Induration, Rash, Dry/Scaly, Atrophie Blanche, Cyanosis, Ecchymosis, Hemosiderin Staining, Mottled, Pallor, Rubor. The surrounding wound skin color is noted with erythema which is circumferential. Periwound temperature was noted as No Abnormality. The periwound has tenderness on palpation. 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 I debrided nonviable tissue. I recommended switching the dressing to PolyMem and continuing Keystone antibiotic ointment to the anterior right leg wound. We will start the wound VAC today to the proximal medial right leg wound. All under Kerlix/Coban. Follow-up next week for VAC change and in 1 week for physician visit. 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 There was a Selective/Open Wound Non-Viable Tissue Debridement with a total area of 24 sq cm performed by Kalman Shan, DO. With the following instrument(s): Curette to remove Viable and Non-Viable tissue/material. Material removed includes [facility] after achieving pain control  using Lidocaine. No specimens were taken. A time out was conducted at 09:56, prior to the start of the procedure. A Minimum amount of bleeding was controlled with Pressure. The procedure was tolerated well with a pain level of 0 throughout and a pain level of 0 following the procedure. Post Debridement Measurements: 5cm length x 4.8cm width x 0.2cm depth; 3.77cm^3 volume. Character of Wound/Ulcer Post Debridement is improved. Severity of Tissue Post Debridement is: Fat layer exposed. Post procedure Diagnosis Wound #1: Same as Pre-Procedure Wound #2 Pre-procedure diagnosis of Wound #2 is a Dehisced Wound located on the  Right,Medial Lower Leg . There was a Excisional Skin/Subcutaneous Tissue/Muscle Debridement with a total area of 23.8 sq cm performed by Kalman Shan, DO. With the following instrument(s): Curette to remove Viable and Non-Viable tissue/material. Material removed includes Muscle, Subcutaneous Tissue, and Slough after achieving pain control using Lidocaine. No specimens were taken. A time out was conducted at 09:56, prior to the start of the procedure. A Minimum amount of bleeding was controlled with Pressure. The procedure was tolerated well with a pain level of 0 throughout and a pain level of 0 following the procedure. Post Debridement Measurements: 6.8cm length x 3.5cm width x 2.2cm depth; 41.123cm^3 volume. Character of Wound/Ulcer Post Debridement is improved. Post procedure Diagnosis Wound #2: Same as Pre-Procedure Plan Follow-up Appointments: Return Appointment in 1 week. - w/ Dr. Cheron Every Rm # 9 02/15/2023 0930 Return Appointment in 2 weeks. - w/ Dr. Heber [place] and Allayne Butcher Rm # 9 02/22/23 @ Other: - Bring in topical antibiotics to each appt time. wound vac bring in at next appt time if approved by insurance. Anesthetic: (In clinic) Topical Lidocaine 5% applied to wound bed Cellular or Tissue Based Products: Wound #1 Right,Anterior Lower Leg: Cellular or Tissue Based Product Type: - 02/01/2023 [employer] insurance for approval. Bathing/ Shower/ Hygiene: May shower with protection but do not get wound dressing(s) wet. Protect dressing(s) with water repellant cover (for example, large plastic bag) or a cast cover and may then take shower. - May wash wounds with dial antibacterial soap in the shower if you immediately dress wound after shower. Negative Presssure Wound Therapy: Wound #2 Right,Medial Lower Leg: Wound Vac to wound continuously at 173m/hg pressure - Will order to be placed for next week- 2-3 times a week dressing change. [facility] will call you with cost of wound  vac before shipping to your home. Bring in next wound vac at next appt time. Black and White Foam combination Edema Control - Lymphedema / SCD / Other: Elevate legs to the level of the heart or above for 30 minutes daily and/or when sitting for 3-4 times a day throughout the day. Avoid standing for long periods of time. Additional Orders / Instructions: Follow Nutritious Diet - increase protein intake with meals, drink boost or Glucerna, and use Juven. Home Health: Admit to Home Health for skilled nursing wound care. May utilize formulary equivalent dressing for wound treatment orders unless otherwise specified. New wound care orders this week; continue Home Health for wound care. May utilize formulary equivalent dressing for wound treatment orders unless otherwise specified. - both wounds topical compounding antibiotics hydrofera blue abd pad, kerlix and coban until wound vac arrives next week then apply it to right medial lower leg. Continue the topical antibiotics hydrofera blue to right circumferential lower leg kerlix coban. Dressing changes to be completed by Home Health on Tuesday / Thursday / Saturday except when [facility] Other Home Health Orders/Instructions: WOUND #1: - Lower Leg Wound Laterality: Right, Anterior Cleanser: Soap and Water 3 x Per Week/30 Days Discharge Instructions: May shower and wash wound with dial antibacterial soap and water prior to dressing change. Cleanser: Wound Cleanser (Generic) 3 x Per Week/30 Days Discharge Instructions: Cleanse the wound with wound cleanser prior to applying a clean dressing using gauze sponges, not tissue or cotton balls. Prim Dressing: Hydrofera Blue Classic Foam, 4x4 in 3 x Per Week/30 Days ary Discharge Instructions: apply over the topical antibiotics. Prim Dressing: Sorbalgon AG Dressing, 4x4 (in/in) 3 x Per Week/30 Days ary Discharge Instructions: *****APPLY TO ANY WEEPING AREAS TO RIGHT  LOWER LEG.***** Prim Dressing: Keystone topical compounding antibiotics 3 x Per Week/30 Days ary Discharge Instructions: applied directly to wound bed. Secondary Dressing: ABD Pad, 5x9 (Generic) 3 x Per Week/30 Days Discharge Instructions: Apply over primary dressing as directed. Secondary Dressing: Zetuvit Plus 4x8 in 3 x Per Week/30 Days Discharge Instructions: Apply over primary dressing as directed. NICHOLUS, FERGUS ([id number]) [id number] Page 9 of 11 Secured With: [employer] Size 5, 10 (yds) (Generic) 3 x Per Week/30 Days Com pression Wrap: Kerlix Roll 4.5x3.1 (in/yd) 3 x Per Week/30 Days Discharge Instructions: Apply Kerlix and Coban compression as directed. Com pression Wrap: Coban Self-Adherent Wrap 4x5 (in/yd) 3 x Per Week/30 Days Discharge Instructions: Apply over Kerlix as directed. WOUND #2: - Lower Leg Wound Laterality: Right, Medial Cleanser: Soap and Water 3 x Per Week/30 Days Discharge Instructions: May shower and wash wound with dial antibacterial soap and water prior to dressing change. Cleanser: Wound Cleanser (Generic) 3 x Per Week/30 Days Discharge Instructions: Cleanse the wound with wound cleanser prior to applying a clean dressing using gauze sponges, not tissue or cotton balls. Prim Dressing: Hydrofera Blue Classic Foam, 4x4 in 3 x Per Week/30 Days ary Discharge Instructions: apply over the topical antibiotics. PACK INTO WOUND BED, UNDERMINING AND TUNNELING. Prim Dressing: Keystone topical compounding antibiotics 3 x Per Week/30 Days ary Discharge Instructions: applied directly to wound bed. Prim Dressing: CONTINUE KEYSTONE TOPICAL ANTIBIOTICS AND HYDROFERA BLUE UNTIL THE WOUND VAC ARRIVES. ONCE ARRIVES APPLY ary WHITE AND BLACK FOAM TO THE MEDIAL LOWER LEG. 3 x Per Week/30 Days Secondary Dressing: ABD Pad, 5x9 (Generic) 3 x Per Week/30 Days Discharge Instructions: Apply over primary dressing as directed. Secondary Dressing:  Woven Gauze Sponge, Non-Sterile 4x4 in 3 x Per Week/30 Days Discharge Instructions: APPLY OVER THE HYDROFERA BLUE TO ENSURE CONTACT WITH WOUND BED. Secondary Dressing: Zetuvit Plus 4x8 in 3 x Per Week/30 Days Discharge Instructions: Apply over primary dressing as directed. Secured With: [employer] Size 5, 10 (yds) (Generic) 3 x Per Week/30 Days Com pression Wrap: Kerlix Roll 4.5x3.1 (in/yd) 3 x Per Week/30 Days Discharge Instructions: Apply Kerlix and Coban compression as directed. Com pression Wrap: Coban Self-Adherent Wrap 4x5 (in/yd) 3 x Per Week/30 Days Discharge Instructions: Apply over Kerlix as directed. 1. In office sharp debridement 2. PolyMem with Keystone antibiotic to the anterior right leg wound 3. Wound VAC to the proximal medial wound 4. Kerlix/Cobanooright lower extremity 5. Follow-up next week for wound VAC change and later in the week for physician visit Electronic Signature(s) Signed: 02/08/2023 12:26:40 PM By: Kalman Shan DO Entered By: Kalman Shan on 02/08/2023 10:10:34 -------------------------------------------------------------------------------- HxROS Details Patient Name: Date of Service: [name] 02/08/2023 9:30 A M Medical Record Number: [id number] Patient Account Number: [id number] Date of Birth/Sex: Treating RN: 1951-11-09 (72 y.o. M) Primary  Care Provider: Maggie Font Other Clinician: Referring Provider: Treating Provider/Extender: Wilnette Kales in Treatment: 4 Information Obtained From Patient Chart Cardiovascular Medical History: Positive for: Hypertension; Peripheral Arterial Disease; Peripheral Venous Disease Past Medical History Notes: Hyperlipidemia Gastrointestinal Medical History: Past Medical History Notes: GERD Oncologic Medical History: Past Medical History Notes: prostate ca Immunizations MASAJI, VALERIO ([id number]) [id number] [id number].pdf Page 10 of  11 Pneumococcal Vaccine: Received Pneumococcal Vaccination: Yes Received Pneumococcal Vaccination On or After 60th Birthday: Yes Implantable Devices None Hospitalization / Surgery History Type of Hospitalization/Surgery femoral-tibial bypass graft right 12/12/22 radical prostatectomy 2011 tonsillectomy age 25 Family and Social History Unknown History: Yes; Former smoker; Marital Status - Single; Alcohol Use: Never; Drug Use: No History; Caffeine Use: Rarely; Financial Concerns: No; Food, Clothing or Shelter Needs: No; Support System Lacking: No; Transportation Concerns: No Electronic Signature(s) Signed: 02/08/2023 12:26:40 PM By: Kalman Shan DO Entered By: Kalman Shan on 02/08/2023 10:07:37 -------------------------------------------------------------------------------- SuperBill Details Patient Name: Date of Service: [name] 02/08/2023 Medical Record Number: [id number] Patient Account Number: [id number] Date of Birth/Sex: Treating RN: [birth year] (72 y.o. 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 L97.812 Non-pressure chronic ulcer of other part of right lower leg with fat layer exposed I70.238 Atherosclerosis of native arteries of right leg with ulceration of other part of lower leg I87.311 Chronic venous hypertension (idiopathic) with ulcer of right lower extremity T81.31XA Disruption of external operation (surgical) wound, not elsewhere classified, initial encounter Facility Procedures : CPT4 Code Description: [id number] - DEB MUSC/FASCIA 20 SQ CM/< ICD-10 Diagnosis Description L97.815 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  w T81.31XA Disruption of external operation (surgical) wound, not  elsewhere classified, initial Modifier: ithout evidence encounter Quantity: 1 of necrosis : CPT4 Code Description: [id number] 11046 - DEB MUSC/FASCIA EA ADDL 20 CM ICD-10 Diagnosis Description L97.815 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 w T81.31XA Disruption of external operation (surgical) wound,  not elsewhere classified, initial Modifier: ithout evidence encounter Quantity: 1 of necrosis : CPT4 Code Description: [id number] - DEBRIDE WOUND 1ST 20 SQ CM OR < ICD-10 Diagnosis Description L97.812 Non-pressure chronic ulcer of other part of right lower leg with fat layer exposed Modifier: Quantity: 1 : CPT4 Code Description: [id number] - DEBRIDE WOUND EA ADDL 20 SQ CM ICD-10 Diagnosis Description L97.812 Non-pressure chronic ulcer of other part of right lower leg with fat layer exposed Modifier: Quantity: 1 Physician Procedures JESUSMANUEL, KENWORTHY ([id number]): CPT4 Code Description [id number] - WC PHYS DEBR MUSCLE/FASCIA 20 SQ CM ICD-10 Diagnosis Description L97.815 Non-pressure chronic ulcer of other part of right lower leg with musc T81.31XA Disruption of external  operation (surgical) wound, not elsewhere clas [phone].pdf Page 11 of 11: Quantity Modifier 1 le involvement without evidence of necrosis sified, initial encounter HILMER, LITWAK ([id number]): [id number] 11046 - WC PHYS DEB MUSC/FASC EA ADDL 20 CM ICD-10 Diagnosis Description L97.815 Non-pressure chronic ulcer of other part of right lower leg with musc T81.31XA Disruption of external operation (surgical) wound,  not elsewhere clas [id number].pdf Page 11 of 11: 1 le involvement without evidence of necrosis sified, initial encounter SANJIV, PETIT ([id number]): [id number] 97597 - WC PHYS DEBR WO ANESTH  20 SQ CM ICD-10 Diagnosis Description [id number]  Non-pressure chronic ulcer of other part of right lower leg with fat [phone].pdf Page 11 of 11: 1 layer exposed OBALOLUWA, GEARS R ([id number]): [id number] - WC PHYS DEBR WO ANESTH EA ADD 20 CM ICD-10 Diagnosis Description [id number] Non-pressure chronic ulcer of other part of right lower leg with fat [phone].pdf Page 11 of 11: 1 layer exposed Electronic Signature(s) Signed: 02/08/2023 12:26:40 PM By: Kalman Shan DO Entered By: Kalman Shan on 02/08/2023 10:11:07

## 2023-02-12 ENCOUNTER — Other Ambulatory Visit: Payer: Self-pay

## 2023-02-12 ENCOUNTER — Encounter (HOSPITAL_BASED_OUTPATIENT_CLINIC_OR_DEPARTMENT_OTHER): Payer: Medicare Other | Admitting: Internal Medicine

## 2023-02-12 DIAGNOSIS — I70238 Atherosclerosis of native arteries of right leg with ulceration of other part of lower right leg: Secondary | ICD-10-CM | POA: Diagnosis not present

## 2023-02-12 DIAGNOSIS — I70221 Atherosclerosis of native arteries of extremities with rest pain, right leg: Secondary | ICD-10-CM

## 2023-02-12 DIAGNOSIS — L97815 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 without evidence of necrosis: Secondary | ICD-10-CM | POA: Diagnosis not present

## 2023-02-12 DIAGNOSIS — I87311 Chronic venous hypertension (idiopathic) with ulcer of right lower extremity: Secondary | ICD-10-CM | POA: Diagnosis not present

## 2023-02-13 DIAGNOSIS — T8131XA Disruption of external operation (surgical) wound, not elsewhere classified, initial encounter: Secondary | ICD-10-CM | POA: Diagnosis not present

## 2023-02-13 DIAGNOSIS — S81801A Unspecified open wound, right lower leg, initial encounter: Secondary | ICD-10-CM | POA: Diagnosis not present

## 2023-02-13 DIAGNOSIS — I70238 Atherosclerosis of native arteries of right leg with ulceration of other part of lower right leg: Secondary | ICD-10-CM | POA: Diagnosis not present

## 2023-02-14 ENCOUNTER — Ambulatory Visit (INDEPENDENT_AMBULATORY_CARE_PROVIDER_SITE_OTHER): Payer: Medicare Other

## 2023-02-14 ENCOUNTER — Ambulatory Visit (INDEPENDENT_AMBULATORY_CARE_PROVIDER_SITE_OTHER): Payer: Medicare Other | Admitting: Vascular Surgery

## 2023-02-14 ENCOUNTER — Encounter: Payer: Self-pay | Admitting: Vascular Surgery

## 2023-02-14 VITALS — BP 108/60 | HR 75 | Temp 98.2°F | Ht 72.0 in

## 2023-02-14 DIAGNOSIS — I70221 Atherosclerosis of native arteries of extremities with rest pain, right leg: Secondary | ICD-10-CM | POA: Diagnosis not present

## 2023-02-14 LAB — VAS US ABI WITH/WO TBI: Left ABI: 0.82

## 2023-02-14 NOTE — Progress Notes (Signed)
[facility]  Patient name: [name] MRN: [id number] DOB: Feb 20, 1951 Sex: male  REASON FOR VISIT: Follow-up right femoral to posterior tibial bypass  HPI: [name] is a 72 y.o. male here for continued follow-up.  He presented with a large wound at the distal dorsum of his ankle and proximal foot.  He underwent arteriography on 11/20/2022 and subsequent right common femoral to posterior tibial bypass with composite Gore-Tex and vein on 12/12/2022.  I last saw him on 01/17/2023.  At that time he was having slow healing of the area on the dorsum of his foot.  He had separated the area of his medial calf wound.  He reports that he is now using a wound VAC at the [facility] and is also being treated with calm pounding pharmacy compound.  Current Outpatient Medications  Medication Sig Dispense Refill   aspirin EC 81 MG tablet Take 81 mg by mouth daily.     atorvastatin (LIPITOR) 40 MG tablet Take 1 tablet (40 mg total) by mouth every morning. 30 tablet 3   esomeprazole (NEXIUM) 20 MG capsule Take 20 mg by mouth daily.     furosemide (LASIX) 20 MG tablet Take 20 mg by mouth daily.     gabapentin (NEURONTIN) 100 MG capsule Take 1 capsule (100 mg total) by mouth 3 (three) times daily. (Patient taking differently: Take 100 mg by mouth at bedtime as needed (Nerve pain).) 90 capsule 3   lisinopril-hydrochlorothiazide (PRINZIDE,ZESTORETIC) 10-12.5 MG tablet Take 1 tablet by mouth every other day.      meloxicam (MOBIC) 15 MG tablet Take 1 tablet by mouth once daily 30 tablet 0   metoprolol tartrate (LOPRESSOR) 25 MG tablet Take 1/2 (one-half) tablet by mouth twice daily 30 tablet 0   Multiple Vitamins-Minerals (CENTRUM SILVER PO) Take 1 tablet by mouth daily.      oxybutynin (DITROPAN-XL) 10 MG 24 hr tablet Take 10 mg by mouth daily.     oxyCODONE-acetaminophen (PERCOCET/ROXICET) 5-325 MG tablet Take 1 tablet  by mouth every 6 (six) hours as needed for moderate pain. 20 tablet 0   SPIKEVAX syringe      potassium chloride SA (KLOR-CON M) 20 MEQ tablet Take 20 mEq by mouth daily. (Patient not taking: Reported on 02/14/2023)     Current Facility-Administered Medications  Medication Dose Route Frequency Provider Last Rate Last Admin   betamethasone acetate-betamethasone sodium phosphate (CELESTONE) injection 3 mg  3 mg Intra-articular Once Daylene Katayama M, DPM       betamethasone acetate-betamethasone sodium phosphate (CELESTONE) injection 3 mg  3 mg Intra-articular Once Edrick Kins, DPM         PHYSICAL EXAM: Vitals:   02/14/23 1434  BP: 108/60  Pulse: 75  Temp: 98.2 F (36.8 C)  SpO2: 95%  Height: 6' (1.829 m)    GENERAL: The patient is a well-nourished male, in no acute distress. The vital signs are documented above. Wound VAC dressing is in place.  Toes are warm on both feet.  We were not able to do ankle arm indices due to his dressing.  With hand-held Doppler he does have a very dampened monophasic flow at the posterior tibial artery.  On his last visit he had very multiphasic bounding signal at the posterior tibial level  MEDICAL ISSUES: I had long discussion with the patient.  He apparently has thrombosed his composite graft since his last visit on 01/17/2023.  I discussed this by  telephone with Dr. Donzetta Matters.  We both agree that would be very little benefit from attempts at redo bypass or lysis or thrombectomy.  He had a very disadvantaged graft and small distal target.  I discussed this with the patient as well.  I would recommend continued local wound care.  He understands that he is at high risk for amputation.  I explained that the indication for amputation would be progressive pain or progressive tissue loss.  The patient will see Dr. Donzetta Matters in our [place] office in 2 weeks.  We will try to coordinate this with a wound care visit at [facility] to save him several trips to [place].  He  lives in [place].   Rosetta Posner, MD FACS [facility] [phone]  Note: Portions of this report may have been transcribed using voice recognition software.  Every effort has been made to ensure accuracy; however, inadvertent computerized transcription errors may still be present.

## 2023-02-14 NOTE — Progress Notes (Signed)
BERTICE, HELMICH ([id number] Page 1 of 5 Visit Report for 02/12/2023 Arrival Information Details Patient Name: Date of Service: [name] 02/12/2023 12:30 PM Medical Record Number: [id number] Patient Account Number: [id number] Date of Birth/Sex: Treating RN: 17-Jul-1951 (72 y.o. M) Primary Care Rekisha Welling: Maggie Font Other Clinician: Referring Lashya Passe: Treating Charman Blasco/Extender: Wilnette Kales in Treatment: 4 Visit Information History Since Last Visit Added or deleted any medications: No Patient Arrived: Cane Any new allergies or adverse reactions: No Arrival Time: 13:04 Had a fall or experienced change in No Accompanied By: self activities of daily living that may affect Transfer Assistance: None risk of falls: Patient Identification Verified: Yes Signs or symptoms of abuse/neglect since last visito No Secondary Verification Process Completed: Yes Hospitalized since last visit: No Patient Requires Transmission-Based Precautions: No Implantable device outside of the clinic excluding No Patient Has Alerts: No cellular tissue based products placed in the center since last visit: Has Dressing in Place as Prescribed: Yes Has Compression in Place as Prescribed: Yes Pain Present Now: Yes Electronic Signature(s) Signed: 02/13/2023 4:38:15 PM By: Erenest Blank Entered By: Erenest Blank on 02/12/2023 13:05:18 -------------------------------------------------------------------------------- Encounter Discharge Information Details Patient Name: Date of Service: [address] R. 02/12/2023 12:30 PM Medical Record Number: [id number] Patient Account Number: [id number] Date of Birth/Sex: Treating RN: Feb 15, 1951 (72 y.o. M) Primary Care Jamire Shabazz: Maggie Font Other Clinician: Erenest Blank Referring Shade Rivenbark: Treating Danniel Tones/Extender: Wilnette Kales in Treatment:  4 Encounter Discharge Information Items Discharge Condition: Stable Ambulatory Status: Cane Discharge Destination: Home Transportation: Private Auto Accompanied By: self Schedule Follow-up Appointment: Yes Clinical Summary of Care: Electronic Signature(s) Signed: 02/13/2023 4:38:15 PM By: Erenest Blank Entered By: Erenest Blank on 02/12/2023 14:18:48 Danford Bad ([id number] Page 2 of 5 -------------------------------------------------------------------------------- Negative Pressure Wound Therapy Maintenance (NPWT) Details Patient Name: Date of Service: [name] 02/12/2023 12:30 PM Medical Record Number: [id number] Patient Account Number: [id number] Date of Birth/Sex: Treating RN: 08-Jul-1951 (72 y.o. M) Primary Care Deannah Rossi: Maggie Font Other Clinician: Referring Aimee Timmons: Treating Glenis Musolf/Extender: Wilnette Kales in Treatment: 4 NPWT Maintenance Performed for: Wound #2 Right, Medial Lower Leg Additional Injuries Covered: No Performed By: Erenest Blank, Type: VAC System Coverage Size (sq cm): 23.8 Pressure Type: Constant Pressure Setting: 125 mmHG Drain Type: None Primary Contact: Non-Adherent Sponge/Dressing Type: Foam, Black Date Initiated: 02/08/2023 Dressing Removed: No Quantity of Sponges/Gauze Removed: 1 black foam Canister Changed: No Canister Exudate Volume: 30 Dressing Reapplied: No Quantity of Sponges/Gauze Inserted: 1 black foam Days On NPWT : 5 Electronic Signature(s) Signed: 02/13/2023 4:38:15 PM By: Erenest Blank Entered By: Erenest Blank on 02/12/2023 14:02:53 -------------------------------------------------------------------------------- Patient/Caregiver Education Details Patient Name: Date of Service: [name] 2/19/2024andnbsp12:30 PM Medical Record Number: [id number] Patient Account Number: [id number] Date of Birth/Gender: Treating  RN: March 10, 1951 (72 y.o. M) Primary Care Physician: Maggie Font Other Clinician: Erenest Blank Referring Physician: Treating Physician/Extender: Wilnette Kales in Treatment: 4 Education Assessment Education Provided To: Patient Education Topics Provided Electronic Signature(s) Signed: 02/13/2023 4:38:15 PM By: Erenest Blank Entered By: Erenest Blank on 02/12/2023 14:15:33 AARONMICHAEL, CREQUE ([id number] Page 3 of 5 -------------------------------------------------------------------------------- Wound Assessment Details Patient Name: Date of Service: [name] 02/12/2023 12:30 PM Medical Record Number: [id number] Patient Account Number: [id number] Date of Birth/Sex: Treating RN: 02-06-1951 (72 y.o. M) Primary Care Ziomara Birenbaum: Iona Beard  K Other Clinician: Referring Girard Koontz: Treating Lynessa Almanzar/Extender: Patrecia Pour Weeks in Treatment: 4 Wound Status Wound Number: 1 Primary Etiology: Arterial Insufficiency Ulcer Wound Location: Right, Anterior Lower Leg Wound Status: Open Wounding Event: Trauma Date Acquired: 09/13/2022 Weeks Of Treatment: 4 Clustered Wound: No Wound Measurements Length: (cm) 5 Width: (cm) 4.8 Depth: (cm) 0.2 Area: (cm) 18.85 Volume: (cm) 3.77 % Reduction in Area: 89.7% % Reduction in Volume: 89.7% Wound Description Classification: Full Thickness With Exposed Support St Exudate Amount: Large Exudate Type: Serosanguineous Exudate Color: red, brown ructures Periwound Skin Texture Texture Color No Abnormalities Noted: No No Abnormalities Noted: No Moisture No Abnormalities Noted: No Treatment Notes Wound #1 (Lower Leg) Wound Laterality: Right, Anterior Cleanser Soap and Water Discharge Instruction: May shower and wash wound with dial antibacterial soap and water prior to dressing change. Wound Cleanser Discharge Instruction: Cleanse the wound with  wound cleanser prior to applying a clean dressing using gauze sponges, not tissue or cotton balls. Peri-Wound Care Topical Keystone Primary Dressing PolyMem Non-Adhesive Dressing, 4x4 in Discharge Instruction: Apply to wound bed as instructed Keystone topical compounding antibiotics Discharge Instruction: applied directly to wound bed. Secondary Dressing ABD Pad, 5x9 Discharge Instruction: Apply over primary dressing as directed. Zetuvit Plus 4x8 in Discharge Instruction: Apply over primary dressing as directed. Secured With [employer] Size 5, 10 (yds) Compression Wrap Kerlix Roll 4.5x3.1 (in/yd) Discharge Instruction: Apply Kerlix and Coban compression as directed. Coban Self-Adherent Wrap 4x5 (in/yd) Discharge Instruction: Apply over Kerlix as directed. MOMODOU, FRACASSO ([id number] Page 4 of 5 Compression Stockings Add-Ons Electronic Signature(s) Signed: 02/13/2023 4:38:15 PM By: Erenest Blank Entered By: Erenest Blank on 02/12/2023 13:05:51 -------------------------------------------------------------------------------- Wound Assessment Details Patient Name: Date of Service: [name] 02/12/2023 12:30 PM Medical Record Number: [id number] Patient Account Number: [id number] Date of Birth/Sex: Treating RN: 08-21-1951 (72 y.o. M) Primary Care Jathan Balling: Maggie Font Other Clinician: Referring Neilah Fulwider: Treating Edin Skarda/Extender: Wilnette Kales in Treatment: 4 Wound Status Wound Number: 2 Primary Etiology: Dehisced Wound Wound Location: Right, Medial Lower Leg Wound Status: Open Wounding Event: Surgical Injury Date Acquired: 01/03/2023 Weeks Of Treatment: 4 Clustered Wound: No Wound Measurements Length: (cm) 6.8 Width: (cm) 3.5 Depth: (cm) 2.2 Area: (cm) 18.692 Volume: (cm) 41.123 % Reduction in Area: -100% % Reduction in Volume: -1000.1% Wound Description Classification: Full  Thickness With Exposed Support St Exudate Amount: Medium Exudate Type: Serosanguineous Exudate Color: red, brown ructures Periwound Skin Texture Texture Color No Abnormalities Noted: No No Abnormalities Noted: No Moisture No Abnormalities Noted: No Treatment Notes Wound #2 (Lower Leg) Wound Laterality: Right, Medial Cleanser Soap and Water Discharge Instruction: May shower and wash wound with dial antibacterial soap and water prior to dressing change. Wound Cleanser Discharge Instruction: Cleanse the wound with wound cleanser prior to applying a clean dressing using gauze sponges, not tissue or cotton balls. Peri-Wound Care Topical Primary Dressing Keystone topical compounding antibiotics Discharge Instruction: applied directly to wound bed. CONTINUE KEYSTONE TOPICAL ANTIBIOTICS AND HYDROFERA BLUE UNTIL THE WOUND VAC ARRIVES. ONCE ARRIVES APPLY WHITE AND BLACK FOAM TO THE MEDIAL LOWER LEG. wound vac INIGO, COOPERSTEIN ([id number]) [id number] Page 5 of 5 Secondary Dressing ABD Pad, 5x9 Discharge Instruction: Apply over primary dressing as directed. Woven Gauze Sponge, Non-Sterile 4x4 in Discharge Instruction: APPLY OVER THE HYDROFERA BLUE TO ENSURE CONTACT WITH WOUND BED. Zetuvit Plus 4x8 in Discharge Instruction: Apply over primary dressing as directed. Secured With [employer] Size 5, 10 (yds) Compression  Wrap Kerlix Roll 4.5x3.1 (in/yd) Discharge Instruction: Apply Kerlix and Coban compression as directed. Coban Self-Adherent Wrap 4x5 (in/yd) Discharge Instruction: Apply over Kerlix as directed. Compression Stockings Add-Ons Electronic Signature(s) Signed: 02/13/2023 4:38:15 PM By: Erenest Blank Entered By: Erenest Blank on 02/12/2023 13:05:51 -------------------------------------------------------------------------------- Vitals Details Patient Name: Date of Service: [name] 02/12/2023 12:30 PM Medical Record Number:  [id number] Patient Account Number: [id number] Date of Birth/Sex: Treating RN: 12/28/1950 (72 y.o. M) Primary Care Juleon Narang: Maggie Font Other Clinician: Referring Yadhira Mckneely: Treating Modean Mccullum/Extender: Wilnette Kales in Treatment: 4 Vital Signs Time Taken: 13:05 Reference Range: 80 - 120 mg / dl Height (in): 71 Weight (lbs): 232 Body Mass Index (BMI): 32.4 Electronic Signature(s) Signed: 02/13/2023 4:38:15 PM By: Erenest Blank Entered By: Erenest Blank on 02/12/2023 13:05:28

## 2023-02-14 NOTE — Progress Notes (Signed)
[name], [name] ([id number]) [id number] Page 1 of 1 Visit Report for 02/12/2023 SuperBill Details Patient Name: Date of Service: [name] 02/12/2023 Medical Record Number: [id number] Patient Account Number: [id number] Date of Birth/Sex: Treating RN: November 08, 1951 (72 y.o. 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

## 2023-02-15 ENCOUNTER — Encounter (HOSPITAL_BASED_OUTPATIENT_CLINIC_OR_DEPARTMENT_OTHER): Payer: Medicare Other | Admitting: Internal Medicine

## 2023-02-15 DIAGNOSIS — I70238 Atherosclerosis of native arteries of right leg with ulceration of other part of lower right leg: Secondary | ICD-10-CM

## 2023-02-15 DIAGNOSIS — I87311 Chronic venous hypertension (idiopathic) with ulcer of right lower extremity: Secondary | ICD-10-CM | POA: Diagnosis not present

## 2023-02-15 DIAGNOSIS — L97815 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 without evidence of necrosis: Secondary | ICD-10-CM

## 2023-02-15 DIAGNOSIS — L97812 Non-pressure chronic ulcer of other part of right lower leg with fat layer exposed: Secondary | ICD-10-CM | POA: Diagnosis not present

## 2023-02-16 ENCOUNTER — Other Ambulatory Visit: Payer: Self-pay | Admitting: Vascular Surgery

## 2023-02-19 ENCOUNTER — Encounter (HOSPITAL_BASED_OUTPATIENT_CLINIC_OR_DEPARTMENT_OTHER): Payer: Medicare Other | Admitting: General Surgery

## 2023-02-19 DIAGNOSIS — I70238 Atherosclerosis of native arteries of right leg with ulceration of other part of lower right leg: Secondary | ICD-10-CM | POA: Diagnosis not present

## 2023-02-19 DIAGNOSIS — I87311 Chronic venous hypertension (idiopathic) with ulcer of right lower extremity: Secondary | ICD-10-CM | POA: Diagnosis not present

## 2023-02-19 DIAGNOSIS — L97815 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 without evidence of necrosis: Secondary | ICD-10-CM | POA: Diagnosis not present

## 2023-02-20 DIAGNOSIS — I10 Essential (primary) hypertension: Secondary | ICD-10-CM | POA: Diagnosis not present

## 2023-02-20 DIAGNOSIS — N1831 Chronic kidney disease, stage 3a: Secondary | ICD-10-CM | POA: Diagnosis not present

## 2023-02-20 DIAGNOSIS — S91301D Unspecified open wound, right foot, subsequent encounter: Secondary | ICD-10-CM | POA: Diagnosis not present

## 2023-02-20 DIAGNOSIS — I70221 Atherosclerosis of native arteries of extremities with rest pain, right leg: Secondary | ICD-10-CM | POA: Diagnosis not present

## 2023-02-20 DIAGNOSIS — R600 Localized edema: Secondary | ICD-10-CM | POA: Diagnosis not present

## 2023-02-20 NOTE — Progress Notes (Signed)
GRAVES, HANSON ([id number]) [id number] Page 1 of 1 Visit Report for 02/19/2023 SuperBill Details Patient Name: Date of Service: [name] 02/19/2023 Medical Record Number: [id number] Patient Account Number: [id number] Date of Birth/Sex: Treating RN: June 21, 1951 (72 y.o. 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 L97.815 of necrosis L97.812 Non-pressure chronic ulcer of other part of right lower leg with fat layer exposed I70.238 Atherosclerosis of native arteries of right leg with ulceration of other part of lower leg I87.311 Chronic venous hypertension (idiopathic) with ulcer of right lower extremity T81.31XA Disruption of external operation (surgical) wound, not elsewhere classified, initial encounter Facility Procedures CPT4 Code Description Modifier Quantity [id number] - WOUND VAC-50 SQ CM OR LESS 1 Electronic Signature(s) Signed: 02/19/2023 12:01:26 PM By: Fredirick Maudlin MD FACS Signed: 02/19/2023 3:50:45 PM By: Rhae Hammock RN Entered By: Rhae Hammock on 02/19/2023 08:22:56

## 2023-02-20 NOTE — Progress Notes (Signed)
KHIZAR, SMITHER ([id number] Page 1 of 5 Visit Report for 02/19/2023 Arrival Information Details Patient Name: Date of Service: [name] 02/19/2023 8:00 A M Medical Record Number: [id number] Patient Account Number: [id number] Date of Birth/Sex: Treating RN: December 01, 1951 (72 y.o. [name], [name] Primary Care Laiden Milles: Maggie Font Other Clinician: Referring Lovene Maret: Treating Juniel Groene/Extender: Kevan Ny in Treatment: 5 Visit Information History Since Last Visit Added or deleted any medications: No Patient Arrived: Ambulatory Any new allergies or adverse reactions: No Arrival Time: 08:20 Had a fall or experienced change in No Accompanied By: self activities of daily living that may affect Transfer Assistance: None risk of falls: Patient Identification Verified: Yes Signs or symptoms of abuse/neglect since last visito No Secondary Verification Process Completed: Yes Hospitalized since last visit: No Patient Requires Transmission-Based Precautions: No Implantable device outside of the clinic excluding No Patient Has Alerts: No cellular tissue based products placed in the center since last visit: Has Dressing in Place as Prescribed: Yes Has Compression in Place as Prescribed: Yes Pain Present Now: No Electronic Signature(s) Signed: 02/19/2023 3:50:45 PM By: Rhae Hammock RN Entered By: Rhae Hammock on 02/19/2023 08:20:28 -------------------------------------------------------------------------------- Encounter Discharge Information Details Patient Name: Date of Service: [name] 02/19/2023 8:00 A M Medical Record Number: [id number] Patient Account Number: [id number] Date of Birth/Sex: Treating RN: 22-Apr-1951 (72 y.o. [name], [name] Primary Care Colonel Krauser: Maggie Font Other Clinician: Referring Rashena Dowling: Treating Dylon Correa/Extender: Kevan Ny in Treatment: 5 Encounter Discharge Information Items Discharge Condition: Stable Ambulatory Status: Ambulatory Discharge Destination: Home Transportation: Private Auto Accompanied By: self Schedule Follow-up Appointment: Yes Clinical Summary of Care: Patient Declined Electronic Signature(s) Signed: 02/19/2023 3:50:45 PM By: Rhae Hammock RN Entered By: Rhae Hammock on 02/19/2023 08:22:49 -------------------------------------------------------------------------------- Negative Pressure Wound Therapy Maintenance (NPWT) Details Patient Name: Date of Service: [name]. 02/19/2023 8:00 A M Medical Record Number: [id number] Patient Account Number: [id number] Date of Birth/Sex: Treating RN: [birth year] (72 y.o. Erie Noe Primary Care Kayloni Rocco: Maggie Font Other Clinician: Referring Cyana Shook: Treating Jamesyn Moorefield/Extender: Cecile Hearing Weeks in Treatment: 5 NPWT Maintenance Performed for: Wound #2 Right, Medial Lower Leg Additional Injuries Covered: No Performed By: Rhae Hammock, RN Danford Bad ([id number]) [id number] Page 2 of 5 Type: VAC System Coverage Size (sq cm): 25.16 Pressure Type: Constant Pressure Setting: 125 mmHG Drain Type: None Primary Contact: Non-Adherent Sponge/Dressing Type: Foam- Black Date Initiated: 02/08/2023 Dressing Removed: Yes Quantity of Sponges/Gauze Removed: 1 black foam Canister Changed: No Canister Exudate Volume: 25 Dressing Reapplied: Yes Quantity of Sponges/Gauze Inserted: 1 black foam Respones T Treatment: o tolerates well Days On NPWT : 12 Electronic Signature(s) Signed: 02/19/2023 3:50:45 PM By: Rhae Hammock RN Entered By: Rhae Hammock on 02/19/2023 08:22:13 -------------------------------------------------------------------------------- Patient/Caregiver Education Details Patient Name: Date of Service: [name]  2/26/2024andnbsp8:00 A M Medical Record Number: [id number] Patient Account Number: [id number] Date of Birth/Gender: Treating RN: [birth year] (72 y.o. Erie Noe Primary Care Physician: Maggie Font Other Clinician: Referring Physician: Treating Physician/Extender: Kevan Ny in Treatment: 5 Education Assessment Education Provided To: Patient Education Topics Provided Wound/Skin Impairment: Methods: Explain/Verbal Responses: Reinforcements needed, State content correctly Electronic Signature(s) Signed: 02/19/2023 3:50:45 PM By: Rhae Hammock RN Entered By: Rhae Hammock on 02/19/2023 08:22:36 -------------------------------------------------------------------------------- Wound Assessment Details Patient Name: Date of Service: RO Fulton Reek R. 02/19/2023 8:00 A  M Medical Record Number: [id number] Patient Account Number: [id number] Date of Birth/Sex: Treating RN: [birth year] (72 y.o. [name], [name] Primary Care Natika Geyer: Maggie Font Other Clinician: Referring Kaizer Dissinger: Treating Demone Lyles/Extender: Cecile Hearing Weeks in Treatment: 5 Wound Status Wound Number: 1 Primary Etiology: Arterial Insufficiency Ulcer Wound Location: Right, Anterior Lower Leg Wound Status: Open Wounding Event: Trauma Date Acquired: 09/13/2022 Weeks Of Treatment: 5 Clustered Wound: No Wound Measurements Length: (cm) 5.5 Width: (cm) 6.5 Depth: (cm) 0.2 Swab, Xsavier R ([id number]) Area: (cm) 28.078 Volume: (cm) 5.616 % Reduction in Area: 84.7% % Reduction in Volume: 84.7% [id number] Page 3 of 5 Wound Description Classification: Full Thickness With Exposed Support St Exudate Amount: Large Exudate Type: Serosanguineous Exudate Color: red, brown ructures Periwound Skin Texture Texture Color No Abnormalities Noted: No No Abnormalities Noted: No Moisture No Abnormalities Noted:  No Treatment Notes Wound #1 (Lower Leg) Wound Laterality: Right, Anterior Cleanser Soap and Water Discharge Instruction: May shower and wash wound with dial antibacterial soap and water prior to dressing change. Wound Cleanser Discharge Instruction: Cleanse the wound with wound cleanser prior to applying a clean dressing using gauze sponges, not tissue or cotton balls. Peri-Wound Care Topical Keystone Primary Dressing PolyMem Non-Adhesive Dressing, 4x4 in Discharge Instruction: Apply to wound bed as instructed Santyl Ointment Discharge Instruction: Apply nickel thick amount to wound bed as instructed Keystone topical compounding antibiotics Discharge Instruction: applied directly to wound bed. Secondary Dressing ABD Pad, 5x9 Discharge Instruction: Apply over primary dressing as directed. Zetuvit Plus 4x8 in Discharge Instruction: Apply over primary dressing as directed. Secured With [employer] Size 5, 10 (yds) Compression Wrap Kerlix Roll 4.5x3.1 (in/yd) Discharge Instruction: Apply Kerlix and Coban compression as directed. Coban Self-Adherent Wrap 4x5 (in/yd) Discharge Instruction: Apply over Kerlix as directed. Compression Stockings Add-Ons Electronic Signature(s) Signed: 02/19/2023 3:50:45 PM By: Rhae Hammock RN Entered By: Rhae Hammock on 02/19/2023 08:20:55 -------------------------------------------------------------------------------- Wound Assessment Details Patient Name: Date of Service: [name] 02/19/2023 8:00 A M Medical Record Number: [id number] Patient Account Number: [id number] Date of Birth/Sex: Treating RN: 1951-01-17 (72 y.o. Erie Noe Primary Care Quanah Majka: Maggie Font Other Clinician: Referring Phebe Dettmer: Treating Soniya Ashraf/Extender: Kevan Ny in Treatment: [address], [place] ([id number]) [id number] Page 4 of 5 Wound Status Wound Number: 2 Primary  Etiology: Dehisced Wound Wound Location: Right, Medial Lower Leg Wound Status: Open Wounding Event: Surgical Injury Date Acquired: 01/03/2023 Weeks Of Treatment: 5 Clustered Wound: No Wound Measurements Length: (cm) 7.4 Width: (cm) 3.4 Depth: (cm) 1.5 Area: (cm) 19.761 Volume: (cm) 29.641 % Reduction in Area: -111.4% % Reduction in Volume: -693% Wound Description Classification: Full Thickness With Exposed Support St Exudate Amount: Medium Exudate Type: Serosanguineous Exudate Color: red, brown ructures Periwound Skin Texture Texture Color No Abnormalities Noted: No No Abnormalities Noted: No Moisture No Abnormalities Noted: No Treatment Notes Wound #2 (Lower Leg) Wound Laterality: Right, Medial Cleanser Soap and Water Discharge Instruction: May shower and wash wound with dial antibacterial soap and water prior to dressing change. Wound Cleanser Discharge Instruction: Cleanse the wound with wound cleanser prior to applying a clean dressing using gauze sponges, not tissue or cotton balls. Peri-Wound Care Topical Primary Dressing Keystone topical compounding antibiotics Discharge Instruction: applied directly to wound bed. CONTINUE KEYSTONE TOPICAL ANTIBIOTICS AND HYDROFERA BLUE UNTIL THE WOUND VAC ARRIVES. ONCE ARRIVES APPLY WHITE AND BLACK FOAM TO THE MEDIAL LOWER LEG. wound vac Secondary Dressing ABD Pad, 5x9 Discharge Instruction: Apply over primary dressing  as directed. Woven Gauze Sponge, Non-Sterile 4x4 in Discharge Instruction: APPLY OVER THE HYDROFERA BLUE TO ENSURE CONTACT WITH WOUND BED. Zetuvit Plus 4x8 in Discharge Instruction: Apply over primary dressing as directed. Secured With [employer] Size 5, 10 (yds) Compression Wrap Kerlix Roll 4.5x3.1 (in/yd) Discharge Instruction: Apply Kerlix and Coban compression as directed. Coban Self-Adherent Wrap 4x5 (in/yd) Discharge Instruction: Apply over Kerlix as directed. Compression  Stockings Add-Ons Electronic Signature(s) Signed: 02/19/2023 3:50:45 PM By: Rhae Hammock RN Entered By: Rhae Hammock on 02/19/2023 08:20:55 Mentink, Elyse Jarvis ([id number] Page 5 of 5 -------------------------------------------------------------------------------- Vitals Details Patient Name: Date of Service: [name] 02/19/2023 8:00 A M Medical Record Number: [id number] Patient Account Number: [id number] Date of Birth/Sex: Treating RN: 1951-08-29 (72 y.o. Erie Noe Primary Care Kennya Schwenn: Maggie Font Other Clinician: Referring Daesean Lazarz: Treating Anaiza Behrens/Extender: Kevan Ny in Treatment: 5 Vital Signs Time Taken: 08:20 Respiratory Rate (breaths/min): 17 Height (in): 71 Reference Range: 80 - 120 mg / dl Weight (lbs): 232 Body Mass Index (BMI): 32.4 Electronic Signature(s) Signed: 02/19/2023 3:50:45 PM By: Rhae Hammock RN Entered By: Rhae Hammock on 02/19/2023 08:20:40

## 2023-02-20 NOTE — Progress Notes (Signed)
LEONHARD, DOWIE ([id number]) [id number] Page 1 of 7 Visit Report for 2/Lewis/2024 Arrival Information Details Patient Name: Date of Service: [name] 2/Lewis/2024 9:30 A M Medical Record Number: [id number] Patient Account Number: [id number] Date of Birth/Sex: Treating RN: Jul 18, [name] (72 y.o. M) Primary Care Artia Singley: Maggie Font Other Clinician: Referring Opie Maclaughlin: Treating Sherryn Pollino/Extender: Wilnette Kales in Treatment: 5 Visit Information History Since Last Visit Added or deleted any medications: No Patient Arrived: [name] Any new allergies or adverse reactions: No Arrival Time: 09:30 Had a fall or experienced change in No Accompanied By: self activities of daily living that Vincent affect Transfer Assistance: None risk of falls: Patient Identification Verified: Yes Signs or symptoms of abuse/neglect since last visito No Secondary Verification Process Completed: Yes Hospitalized since last visit: No Patient Requires Transmission-Based Precautions: No Implantable device outside of the clinic excluding No Patient Has Alerts: No cellular tissue based products placed in the center since last visit: Has Compression in Place as Prescribed: Yes Pain Present Now: Yes Electronic Signature(s) Signed: 02/16/2023 12:09:33 PM By: Erenest Blank Entered By: Erenest Blank on 02/Lewis/2024 09:30:49 -------------------------------------------------------------------------------- Lower Extremity Assessment Details Patient Name: Date of Service: Vincent Eng Lewis. 2/Lewis/2024 9:30 A M Medical Record Number: [id number] Patient Account Number: [id number] Date of Birth/Sex: Treating RN: [birth year] (72 y.o. [name], [name] Primary Care Phelix Fudala: Maggie Font Other Clinician: Referring Josephine Rudnick: Treating Emmalin Jaquess/Extender: Wilnette Kales in Treatment: 5 Edema Assessment Assessed: [Left: No] [Right:  Yes] Edema: [Left: Ye] [Right: s] Calf Left: Right: Point of Measurement: 42 cm From Medial Instep 43 cm Ankle Left: Right: Point of Measurement: 9 cm From Medial Instep 27 cm Vascular Assessment Pulses: Dorsalis Pedis Palpable: [Right:Yes] Posterior Tibial Palpable: [Right:Yes] Electronic Signature(s) Signed: 02/19/2023 3:50:45 PM By: Rhae Hammock RN Entered By: Rhae Hammock on 02/Lewis/2024 [place], [place] ([id number] Page 2 of 7 -------------------------------------------------------------------------------- Multi Wound Chart Details Patient Name: Date of Service: [name] 2/Lewis/2024 9:30 A M Medical Record Number: [id number] Patient Account Number: [id number] Date of Birth/Sex: Treating RN: [birth year]-Jun-[name] (72 y.o. M) Primary Care Domani Bakos: Maggie Font Other Clinician: Referring Shailey Butterbaugh: Treating Donaldo Teegarden/Extender: Wilnette Kales in Treatment: 5 Vital Signs Height(in): 71 Pulse(bpm): 73 Weight(lbs): 232 Blood Pressure(mmHg): 123/7 Body Mass Index(BMI): 32.4 Temperature(F): 97.6 Respiratory Rate(breaths/min): 18 [1:Photos:] [N/A:N/A] Right, Anterior Lower Leg Right, Medial Lower Leg N/A Wound Location: Trauma Surgical Injury N/A Wounding Event: Arterial Insufficiency Ulcer Dehisced Wound N/A Primary Etiology: Hypertension, Peripheral Arterial Hypertension, Peripheral Arterial N/A Comorbid History: Disease, Peripheral Venous Disease Disease, Peripheral Venous Disease 09/13/2022 01/03/2023 N/A Date Acquired: 5 5 N/A Weeks of Treatment: Open Open N/A Wound Status: No No N/A Wound Recurrence: 5.5x6.5x0.2 7.4x3.4x1.5 N/A Measurements L x W x D (cm) 28.078 19.761 N/A A (cm) : rea 5.616 29.641 N/A Volume (cm) : 84.70% -111.40% N/A % Reduction in A rea: 84.70% -693.00% N/A % Reduction in Volume: 7 Position 1 (o'clock): 3.4 Maximum Distance 1 (cm): No Yes  N/A Tunneling: Full Thickness With Exposed Support Full Thickness With Exposed Support N/A Classification: Structures Structures Large Medium N/A Exudate A mount: Serosanguineous Serosanguineous N/A Exudate Type: red, brown red, brown N/A Exudate Color: Distinct, outline attached Distinct, outline attached N/A Wound Margin: Medium (34-66%) Large (67-100%) N/A Granulation A mount: Red, Pink Red, Pink N/A Granulation Quality: Medium (34-66%) Small (1-33%) N/A Necrotic A mount: Eschar, Adherent Slough Eschar, Adherent Slough N/A Necrotic Tissue:  Fascia: No Fat Layer (Subcutaneous Tissue): Yes N/A Exposed Structures: Fat Layer (Subcutaneous Tissue): No Fascia: No Tendon: No Tendon: No Muscle: No Muscle: No Joint: No Joint: No Bone: No Bone: No Small (1-33%) N/A N/A Epithelialization: Chemical/Enzymatic/Mechanical - Debridement - Excisional N/A Debridement: Excisional Pre-procedure Verification/Time Out 10:00 10:00 N/A Taken: Lidocaine Lidocaine N/A Pain Control: Necrotic/Eschar, Subcutaneous, Subcutaneous, Slough N/A Tissue Debrided: Slough N/A Skin/Subcutaneous Tissue N/A Level: N/A 25.16 N/A Debridement A (sq cm): rea N/A Curette N/A Instrument: Minimum Minimum N/A Bleeding: Pressure Pressure N/A Hemostasis Achieved: 0 0 N/A Procedural Pain: 0 0 N/A Post Procedural Pain: Debridement Treatment Response: Procedure was tolerated well Procedure was tolerated well N/A Post Debridement Measurements L x 5.5x6.5x0.2 7.4x3.4x1.5 N/A W x D (cm) 5.616 29.641 N/A Post Debridement Volume: (cm) Excoriation: No Excoriation: No N/A Periwound Skin TextureTREVANTE, COBURN ([id number]) [id number] Page 3 of 7 Induration: No Induration: No Callus: No Callus: No Crepitus: No Crepitus: No Rash: No Rash: No Scarring: No Scarring: No Maceration: Yes Maceration: Yes N/A Periwound Skin Moisture: Dry/Scaly: No Dry/Scaly:  No Atrophie Blanche: No Atrophie Blanche: No N/A Periwound Skin Color: Cyanosis: No Cyanosis: No Ecchymosis: No Ecchymosis: No Erythema: No Erythema: No Hemosiderin Staining: No Hemosiderin Staining: No Mottled: No Mottled: No Pallor: No Pallor: No Rubor: No Rubor: No No Abnormality No Abnormality N/A Temperature: Yes Yes N/A Tenderness on Palpation: Debridement Debridement N/A Procedures Performed: Negative Pressure Wound Therapy Maintenance (NPWT) Treatment Notes Electronic Signature(s) Signed: 2/Lewis/2024 11:46:05 AM By: Kalman Shan DO Entered By: Kalman Shan on 02/Lewis/2024 10:36:28 -------------------------------------------------------------------------------- Multi-Disciplinary Care Plan Details Patient Name: Date of Service: Vincent Eng Lewis. 2/Lewis/2024 9:30 A M Medical Record Number: [id number] Patient Account Number: [id number] Date of Birth/Sex: Treating RN: [birth year] (72 y.o. [name], [name] Primary Care Kenecia Barren: Maggie Font Other Clinician: Referring Dareion Kneece: Treating Keryl Gholson/Extender: Wilnette Kales in Treatment: 5 Active Inactive Wound/Skin Impairment Nursing Diagnoses: Impaired tissue integrity Knowledge deficit related to ulceration/compromised skin integrity Goals: Patient will have a decrease in wound volume by X% from date: (specify in notes) Date Initiated: 01/11/2023 Target Resolution Date: 03/23/2023 Goal Status: Active Patient/caregiver will verbalize understanding of skin care regimen Date Initiated: 01/11/2023 Date Inactivated: 01/30/2023 Target Resolution Date: 02/03/2023 Goal Status: Met Ulcer/skin breakdown will have a volume reduction of 30% by week 4 Date Initiated: 01/11/2023 Target Resolution Date: 2/Lewis/2024 Goal Status: Active Interventions: Assess patient/caregiver ability to obtain necessary supplies Assess patient/caregiver ability to perform ulcer/skin care regimen upon admission  and as needed Assess ulceration(s) every visit Notes: Electronic Signature(s) Signed: 02/19/2023 3:50:45 PM By: Rhae Hammock RN Entered By: Rhae Hammock on 02/Lewis/2024 10:02:54 Negative Pressure Wound Therapy Maintenance (NPWT) Details -------------------------------------------------------------------------------- Danford Bad ([id number]) [id number] Page 4 of 7 Patient Name: Date of Service: [name] 2/Lewis/2024 9:30 A M Medical Record Number: [id number] Patient Account Number: [id number] Date of Birth/Sex: Treating RN: [birth year][name] (72 y.o. [name], [name] Primary Care Lisandra Mathisen: Maggie Font Other Clinician: Referring Aviona Martenson: Treating Kenslie Abbruzzese/Extender: Wilnette Kales in Treatment: 5 NPWT Maintenance Performed for: Wound #2 Right, Medial Lower Leg Additional Injuries Covered: No Performed By: Rhae Hammock, RN Type: VAC System Coverage Size (sq cm): 25.16 Pressure Type: Constant Pressure Setting: 125 mmHG Drain Type: None Primary Contact: Non-Adherent Sponge/Dressing Type: Foam, Black Date Initiated: 02/08/2023 Dressing Removed: Yes Quantity of Sponges/Gauze Removed: 1 black foam Canister Changed: Yes Canister Exudate Volume: 50 Dressing Reapplied: Yes Quantity of Sponges/Gauze Inserted: 1 black  foam Respones T Treatment: o tolerates well Days On NPWT : 8 Post Procedure Diagnosis Same as Pre-procedure Electronic Signature(s) Signed: 02/19/2023 3:50:45 PM By: Rhae Hammock RN Entered By: Rhae Hammock on 02/Lewis/2024 09:58:02 -------------------------------------------------------------------------------- Pain Assessment Details Patient Name: Date of Service: Vincent Eng Lewis. 2/Lewis/2024 9:30 A M Medical Record Number: [id number] Patient Account Number: [id number] Date of Birth/Sex: Treating RN: March 01, [name] (72 y.o. M) Primary Care Taiven Greenley: Maggie Font Other  Clinician: Referring Jennaya Pogue: Treating Amilyah Nack/Extender: Wilnette Kales in Treatment: 5 Active Problems Location of Pain Severity and Description of Pain Patient Has Paino Yes Site Locations Pain Location: Pain in Ulcers Rate the pain. Current Pain Level: 7 Pain Management and Medication Current Pain Management: Electronic Signature(s) Signed: 02/16/2023 12:09:33 PM By: Leory Plowman, Belenda Cruise Lewis ([id number]) Erenest Blank [phone].pdf Page 5 of 7 Signed: 02/16/2023 12:09:33 PM By: [name] Reichmann By: Erenest Blank on 02/Lewis/2024 09:31:27 -------------------------------------------------------------------------------- Patient/Caregiver Education Details Patient Name: Date of Service: [name] 2/Lewis/2024andnbsp9:30 A M Medical Record Number: [id number] Patient Account Number: [id number] Date of Birth/Gender: Treating RN: Lewis-Jun-[name] (72 y.o. [name] Primary Care Physician: Maggie Font Other Clinician: Referring Physician: Treating Physician/Extender: Wilnette Kales in Treatment: 5 Education Assessment Education Provided To: Patient Education Topics Provided Wound/Skin Impairment: Methods: Explain/Verbal Responses: Reinforcements needed, State content correctly Electronic Signature(s) Signed: 02/19/2023 3:50:45 PM By: Rhae Hammock RN Entered By: Rhae Hammock on 02/Lewis/2024 10:03:11 -------------------------------------------------------------------------------- Wound Assessment Details Patient Name: Date of Service: Vincent Eng Lewis. 2/Lewis/2024 9:30 A M Medical Record Number: [id number] Patient Account Number: [id number] Date of Birth/Sex: Treating RN: [name], [name] (72 y.o. [name], [name] Primary Care Blimy Napoleon: Maggie Font Other Clinician: Referring Alaila Pillard: Treating Luree Palla/Extender: Wilnette Kales in Treatment: 5 Wound  Status Wound Number: 1 Primary Arterial Insufficiency Ulcer Etiology: Wound Location: Right, Anterior Lower Leg Wound Status: Open Wounding Event: Trauma Comorbid Hypertension, Peripheral Arterial Disease, Peripheral Venous Date Acquired: 09/13/2022 History: Disease Weeks Of Treatment: 5 Clustered Wound: No Photos Wound Measurements Length: (cm) 5.5 Width: (cm) 6.5 Depth: (cm) 0.2 Area: (cm) 28.078 Volume: (cm) 5.616 % Reduction in Area: 84.7% % Reduction in Volume: 84.7% Epithelialization: Small (1-33%) Tunneling: No Undermining: No Wound Description [name], [name] ([id number]) Classification: Full Thickness With Exposed Support Structures Wound Margin: Distinct, outline attached Exudate Amount: Large Exudate Type: Serosanguineous Exudate Color: red, brown [phone].pdf Page 6 of 7 Foul Odor After Cleansing: No Wound Bed Granulation Amount: Medium (34-66%) Exposed Structure Granulation Quality: Red, Pink Fascia Exposed: No Necrotic Amount: Medium (34-66%) Fat Layer (Subcutaneous Tissue) Exposed: No Necrotic Quality: Eschar, Adherent Slough Tendon Exposed: No Muscle Exposed: No Joint Exposed: No Bone Exposed: No Periwound Skin Texture Texture Color No Abnormalities Noted: No No Abnormalities Noted: No Callus: No Atrophie Blanche: No Crepitus: No Cyanosis: No Excoriation: No Ecchymosis: No Induration: No Erythema: No Rash: No Hemosiderin Staining: No Scarring: No Mottled: No Pallor: No Moisture Rubor: No No Abnormalities Noted: No Dry / Scaly: No Temperature / Pain Maceration: Yes Temperature: No Abnormality Tenderness on Palpation: Yes Electronic Signature(s) Signed: 02/16/2023 12:09:33 PM By: Erenest Blank Signed: 02/19/2023 3:50:45 PM By: Rhae Hammock RN Entered By: Erenest Blank on 02/Lewis/2024 09:46:25 -------------------------------------------------------------------------------- Wound Assessment  Details Patient Name: Date of Service: Vincent Eng Lewis. 2/Lewis/2024 9:30 A M Medical Record Number: [id number] Patient Account Number: [id number] Date of Birth/Sex: Treating RN: [birth year]-[name] (72 y.o. M) Primary Care Takeo Harts: Iona Beard  K Other Clinician: Referring Raejean Swinford: Treating Defne Gerling/Extender: Patrecia Pour Weeks in Treatment: 5 Wound Status Wound Number: 2 Primary Dehisced Wound Etiology: Wound Location: Right, Medial Lower Leg Wound Status: Open Wounding Event: Surgical Injury Comorbid Hypertension, Peripheral Arterial Disease, Peripheral Venous Date Acquired: 01/03/2023 History: Disease Weeks Of Treatment: 5 Clustered Wound: No Photos Wound Measurements Length: (cm) 7.4 Width: (cm) 3.4 Depth: (cm) 1.5 [name], [name] ([id number]) Area: (cm) 19.761 Volume: (cm) 29.641 % Reduction in Area: -111.4% % Reduction in Volume: -693% Tunneling: Yes [phone].pdf Page 7 of 7 Position (o'clock): 7 Maximum Distance: (cm) 3.4 Undermining: No Wound Description Classification: Full Thickness With Exposed Support Structures Wound Margin: Distinct, outline attached Exudate Amount: Medium Exudate Type: Serosanguineous Exudate Color: red, brown Foul Odor After Cleansing: No Slough/Fibrino Yes Wound Bed Granulation Amount: Large (67-100%) Exposed Structure Granulation Quality: Red, Pink Fascia Exposed: No Necrotic Amount: Small (1-33%) Fat Layer (Subcutaneous Tissue) Exposed: Yes Necrotic Quality: Eschar, Adherent Slough Tendon Exposed: No Muscle Exposed: No Joint Exposed: No Bone Exposed: No Periwound Skin Texture Texture Color No Abnormalities Noted: No No Abnormalities Noted: No Callus: No Atrophie Blanche: No Crepitus: No Cyanosis: No Excoriation: No Ecchymosis: No Induration: No Erythema: No Rash: No Hemosiderin Staining: No Scarring: No Mottled: No Pallor: No Moisture Rubor: No No Abnormalities  Noted: No Dry / Scaly: No Temperature / Pain Maceration: Yes Temperature: No Abnormality Tenderness on Palpation: Yes Electronic Signature(s) Signed: 02/16/2023 12:09:33 PM By: Erenest Blank Entered By: Erenest Blank on 02/Lewis/2024 09:48:10 -------------------------------------------------------------------------------- Vitals Details Patient Name: Date of Service: Vincent Eng Lewis. 2/Lewis/2024 9:30 A M Medical Record Number: [id number] Patient Account Number: [id number] Date of Birth/Sex: Treating RN: [birth year]/[name] (72 y.o. M) Primary Care Keontre Defino: Maggie Font Other Clinician: Referring Natausha Jungwirth: Treating Sheletha Bow/Extender: Wilnette Kales in Treatment: 5 Vital Signs Time Taken: 09:32 Temperature (F): 97.6 Height (in): 71 Pulse (bpm): 73 Weight (lbs): 232 Respiratory Rate (breaths/min): 18 Body Mass Index (BMI): 32.4 Blood Pressure (mmHg): 123/7 Reference Range: 80 - 120 mg / dl Electronic Signature(s) Signed: 02/16/2023 12:09:33 PM By: Erenest Blank Entered By: Erenest Blank on 02/Lewis/2024 09:33:09

## 2023-02-20 NOTE — Progress Notes (Signed)
TRYG, INDELICATO ([id number]) [id number] Page 1 of 10 Visit Report for 02/15/2023 Chief Complaint Document Details Patient Name: Date of Service: [name] 02/15/2023 9:30 A M Medical Record Number: [id number] Patient Account Number: [id number] Date of Birth/Sex: Treating RN: 04-23-1951 (72 y.o. M) Primary Care Provider: Maggie Font Other Clinician: Referring Provider: Treating Provider/Extender: Wilnette Kales in Treatment: 5 Information Obtained from: Patient Chief Complaint 01/11/2023; arterial right lower extremity wound, surgical dehiscence to the medial right lower extremity Electronic Signature(s) Signed: 02/15/2023 11:46:05 AM By: Kalman Shan DO Entered By: Kalman Shan on 02/15/2023 10:36:37 -------------------------------------------------------------------------------- Debridement Details Patient Name: Date of Service: Vincent Eng R. 02/15/2023 9:30 A M Medical Record Number: [id number] Patient Account Number: [id number] Date of Birth/Sex: Treating RN: 10/12/1951 (72 y.o. [name], [name] Primary Care Provider: Maggie Font Other Clinician: Referring Provider: Treating Provider/Extender: Wilnette Kales in Treatment: 5 Debridement Performed for Assessment: Wound #1 Right,Anterior Lower Leg Performed By: Physician Kalman Shan, DO Debridement Type: Chemical/Enzymatic/Mechanical Agent Used: Santyl Severity of Tissue Pre Debridement: Fat layer exposed Level of Consciousness (Pre-procedure): Awake and Alert Pre-procedure Verification/Time Out Yes - 10:00 Taken: Start Time: 10:00 Pain Control: Lidocaine Bleeding: Minimum Hemostasis Achieved: Pressure End Time: 10:00 Procedural Pain: 0 Post Procedural Pain: 0 Response to Treatment: Procedure was tolerated well Level of Consciousness (Post- Awake and Alert procedure): Post Debridement Measurements of  Total Wound Length: (cm) 5.5 Width: (cm) 6.5 Depth: (cm) 0.2 Volume: (cm) 5.616 Character of Wound/Ulcer Post Debridement: Improved Severity of Tissue Post Debridement: Fat layer exposed Post Procedure Diagnosis Same as Pre-procedure Electronic Signature(s) Signed: 02/15/2023 11:46:05 AM By: Kalman Shan DO Signed: 02/19/2023 3:50:45 PM By: Rhae Hammock RN Entered By: Rhae Hammock on 02/15/2023 10:01:11 [name] ([id number]) [id number] Page 2 of 10 -------------------------------------------------------------------------------- Debridement Details Patient Name: Date of Service: [name] 02/15/2023 9:30 A M Medical Record Number: [id number] Patient Account Number: [id number] Date of Birth/Sex: Treating RN: 1951/06/24 (72 y.o. [name], [name] Primary Care Provider: Maggie Font Other Clinician: Referring Provider: Treating Provider/Extender: Wilnette Kales in Treatment: 5 Debridement Performed for Assessment: Wound #2 Right,Medial Lower Leg Performed By: Physician Kalman Shan, DO Debridement Type: Debridement Level of Consciousness (Pre-procedure): Awake and Alert Pre-procedure Verification/Time Out Yes - 10:00 Taken: Start Time: 10:00 Pain Control: Lidocaine T Area Debrided (L x W): otal 7.4 (cm) x 3.4 (cm) = 25.16 (cm) Tissue and other material debrided: Viable, Non-Viable, Slough, Subcutaneous, Slough Level: Skin/Subcutaneous Tissue Debridement Description: Excisional Instrument: Curette Bleeding: Minimum Hemostasis Achieved: Pressure End Time: 10:00 Procedural Pain: 0 Post Procedural Pain: 0 Response to Treatment: Procedure was tolerated well Level of Consciousness (Post- Awake and Alert procedure): Post Debridement Measurements of Total Wound Length: (cm) 7.4 Width: (cm) 3.4 Depth: (cm) 1.5 Volume: (cm) 29.641 Character of Wound/Ulcer Post Debridement:  Improved Post Procedure Diagnosis Same as Pre-procedure Electronic Signature(s) Signed: 02/15/2023 11:46:05 AM By: Kalman Shan DO Signed: 02/19/2023 3:50:45 PM By: Rhae Hammock RN Entered By: Rhae Hammock on 02/15/2023 10:01:54 -------------------------------------------------------------------------------- HPI Details Patient Name: Date of Service: Vincent Eng R. 02/15/2023 9:30 A M Medical Record Number: [id number] Patient Account Number: [id number] Date of Birth/Sex: Treating RN: [birth year] (72 y.o. M) Primary Care Provider: Maggie Font Other Clinician: Referring Provider: Treating Provider/Extender: Wilnette Kales in Treatment: 5 History of Present Illness HPI Description: 01/11/2023 [name] is a  72 year old male with a past medical history of peripheral arterial disease status post femoropopliteal bypass graft on 12/12/2022 and prostate cancer that presents to the clinic with 2 wounds to his right lower extremity. He states that in October 2023 he was wearing a boot that rubbed into his leg creating a wound. Since the wound was not healing he had ABIs completed that showed a right ABI of 0.35 and TBI of 0. [facility], Dr. Donnetta Hutching who did a femoropopliteal bypass graft on the right on 12/12/2022. He reports chronic pain to the wound site. He reports having staples removed to the medial right leg 1 week ago and the wound site has dehisced. He has not been dressing either of the wound beds. He currently denies systemic signs of infection. 1/25; patient presents for follow-up. He followed up with vein and vascular yesterday. It was noted that he had excellent biphasic flow in the posterior tibial of the right lower extremity. He has been using Hydrofera Blue and Santyl to the wound beds. He has chronic pain to the wound sites. 2/1; patient presents for follow-up. Patient had a PCR culture done at last clinic visit that  showed E. coli, coagulase-negative staph, Candida parapsilosis and actinotignum schaalii. He is currently on ciprofloxacin that covers the E. coli. I will switch him over to Augmentin. We ordered [facility] antibiotic ointment and patient has paid for this and states it is coming in the mail tomorrow. For now he has been using Vashe wet-to-dry dressings. 2/6; patient presents for follow-up. He obtained the [facility] antibiotic in the mail. He started this Saturday. He has had no issues with it. He also continues to take Augmentin. He has a few days left. He denies systemic signs of infection. [name], [name] ([id number]) [id number] Page 3 of 10 2/8; patient presents for follow-up. He had a wrap placed at last clinic visit and he tolerated this well. We have been using Keystone antibiotic ointment with Hydrofera Blue under Kerlix/Coban. We have ordered the wound VAC but have not heard back for insurance approval. He currently denies systemic signs of infection. 2/15; patient presents for follow-up. We have been using Hydrofera Blue and Keystone antibiotic ointment to the right lower extremity wounds under Kerlix/Coban. He has the wound VAC with him today. Unfortunately insurance is not approving home health. He will come in our clinic to have the [place] changed. 2/22; patient presents for follow-up. We have been using the wound VAC to the proximal right lower extremity wound and polymem and Keystone antibiotic ointment to the distal right lower extremity wound under Kerlix/Coban. He underwent arteriography on 11/20/2022 and subsequent right common femoral to posterior tibial bypass with composite Gore-T and vein on 12/12/2022. During his follow-up with [facility] yesterday it was noted that he has thrombosed his ex composite graft. There is little benefit to attempt the redo bypass or lysis of the thrombectomy. He was explained that he had a high risk for amputation. He currently  denies systemic signs of infection. Electronic Signature(s) Signed: 02/15/2023 11:46:05 AM By: Kalman Shan DO Entered By: Kalman Shan on 02/15/2023 10:46:56 -------------------------------------------------------------------------------- Physical Exam Details Patient Name: Date of Service: Vincent Eng R. 02/15/2023 9:30 A M Medical Record Number: [id number] Patient Account Number: [id number] Date of Birth/Sex: Treating RN: [birth year] (72 y.o. M) Primary Care Provider: Maggie Font Other Clinician: Referring Provider: Treating Provider/Extender: Wilnette Kales in Treatment: 5 Constitutional respirations regular, non-labored and within target range for  patient.. Cardiovascular 2+ dorsalis pedis/posterior tibialis pulses. Psychiatric pleasant and cooperative. Notes Right lower extremity: T the anterior distal aspect there is an open wound with nonviable surface and granulation tissue. T the medial right leg there is an o o incision site with a large dehisced wound with non viable tissue and granulation tissue. no purulent drainage. 2+ pitting edema to the knee. No surrounding signs of soft tissue infection to any of the wound beds. Electronic Signature(s) Signed: 02/15/2023 11:46:05 AM By: Kalman Shan DO Entered By: Kalman Shan on 02/15/2023 10:40:03 -------------------------------------------------------------------------------- Physician Orders Details Patient Name: Date of Service: Vincent Eng R. 02/15/2023 9:30 A M Medical Record Number: [id number] Patient Account Number: [id number] Date of Birth/Sex: Treating RN: [birth year] (72 y.o. [name], [name] Primary Care Provider: Maggie Font Other Clinician: Referring Provider: Treating Provider/Extender: Wilnette Kales in Treatment: 5 Verbal / Phone Orders: No Diagnosis Coding Follow-up Appointments ppointment in 1 week. - ***EXTRA TIME  BLOCK EXTRA ROOM D/T WOUND VAC AND EXTENSIVE WOUND CARE*** Return A w/ Dr. Cheron Every Rm # 9 02/22/2023 @ 0930 (already has appt.) ppointment in 2 weeks. - ***EXTRA TIME BLOCK EXTRA ROOM D/T WOUND VAC AND EXTENSIVE WOUND CARE*** Return A w/ Dr. Heber  and Lauran Rm # 9 Thursday 03/01/23 @ 9:30 Nurse Visit: - MONDAY 02/26 @ 8:00 rM # 7 Other: - Bring in topical antibiotics to each appt time. Bring wound vac supplies every appt. Anesthetic (In clinic) Topical Lidocaine 5% applied to wound bed [name], [name] ([id number]) [phone].pdf Page 4 of 10 Cellular or Tissue Based Products Wound #1 Right,Anterior Lower Leg Cellular or Tissue Based Product Type: - 02/01/2023 [employer] run insurance for approval. Bathing/ Shower/ Hygiene May shower with protection but do not get wound dressing(s) wet. Protect dressing(s) with water repellant cover (for example, large plastic bag) or a cast cover and may then take shower. - May wash wounds with dial antibacterial soap in the shower if you immediately dress wound after shower. Negative Presssure Wound Therapy Wound #2 Right,Medial Lower Leg Wound Vac to wound continuously at 19m/hg pressure - change twice a week at wound care center unless home health approved by insurance. Nurse visits every Monday, wound care encounters every Thursday Black Foam Edema Control - Lymphedema / SCD / Other Elevate legs to the level of the heart or above for 30 minutes daily and/or when sitting for 3-4 times a day throughout the day. Avoid standing for long periods of time. Additional Orders / Instructions Follow Nutritious Diet - increase protein intake with meals, drink boost or Glucerna, and use Juven. [facility] [place] [place] [facility] [place] [place] [facility] May utilize formulary equivalent dressing for wound treatment orders unless otherwise specified. New wound care orders this week; continue Home Health for wound  care. May utilize formulary equivalent dressing for wound treatment orders unless otherwise specified. - both wounds topical compounding antibiotics hydrofera blue abd pad, kerlix and coban until wound vac arrives next week then apply it to right medial lower leg. Continue the topical antibiotics hydrofera blue to right circumferential lower leg kerlix coban. [place] [facility] [place] / Saturday [facility] Other Home Health Orders/Instructions: Wound Treatment Wound #1 - Lower Leg Wound Laterality: Right, Anterior Cleanser: Soap and Water 3 x Per Week/30 Days Discharge Instructions: May shower and wash wound with dial antibacterial soap and water prior to  dressing change. Cleanser: Wound Cleanser (Generic) 3 x Per Week/30 Days Discharge Instructions: Cleanse the wound with wound cleanser prior to applying a clean dressing using gauze sponges, not tissue or cotton balls. Topical: Keystone 3 x Per Week/30 Days Prim Dressing: PolyMem Non-Adhesive Dressing, 4x4 in 3 x Per Week/30 Days ary Discharge Instructions: Apply to wound bed as instructed Prim Dressing: Santyl Ointment 3 x Per Week/30 Days ary Discharge Instructions: Apply nickel thick amount to wound bed as instructed Prim Dressing: Keystone topical compounding antibiotics 3 x Per Week/30 Days ary Discharge Instructions: applied directly to wound bed. Secondary Dressing: ABD Pad, 5x9 (Generic) 3 x Per Week/30 Days Discharge Instructions: Apply over primary dressing as directed. Secondary Dressing: Zetuvit Plus 4x8 in 3 x Per Week/30 Days Discharge Instructions: Apply over primary dressing as directed. Secured With: [employer] Size 5, 10 (yds) (Generic) 3 x Per Week/30 Days Compression Wrap: Kerlix Roll 4.5x3.1 (in/yd) 3 x Per Week/30 Days Discharge Instructions: Apply Kerlix and Coban compression as directed. Compression Wrap: Coban  Self-Adherent Wrap 4x5 (in/yd) 3 x Per Week/30 Days Discharge Instructions: Apply over Kerlix as directed. Wound #2 - Lower Leg Wound Laterality: Right, Medial Cleanser: Soap and Water 3 x Per Week/30 Days Discharge Instructions: May shower and wash wound with dial antibacterial soap and water prior to dressing change. Cleanser: Wound Cleanser (Generic) 3 x Per Week/30 Days Discharge Instructions: Cleanse the wound with wound cleanser prior to applying a clean dressing using gauze sponges, not tissue or cotton balls. Prim Dressing: Keystone topical compounding antibiotics 3 x Per Week/30 Days ary Discharge Instructions: applied directly to wound bed. Prim Dressing: CONTINUE KEYSTONE TOPICAL ANTIBIOTICS AND HYDROFERA BLUE UNTIL THE WOUND VAC ARRIVES. ONCE ARRIVES APPLY ary WHITE AND BLACK FOAM TO THE MEDIAL LOWER LEG. [name], [name] ([id number]) [id number] Page 5 of 10 3 x Per Week/30 Days Prim Dressing: wound vac ary 3 x Per Week/30 Days Secondary Dressing: ABD Pad, 5x9 (Generic) 3 x Per Week/30 Days Discharge Instructions: Apply over primary dressing as directed. Secondary Dressing: Woven Gauze Sponge, Non-Sterile 4x4 in 3 x Per Week/30 Days Discharge Instructions: APPLY OVER THE HYDROFERA BLUE TO ENSURE CONTACT WITH WOUND BED. Secondary Dressing: Zetuvit Plus 4x8 in 3 x Per Week/30 Days Discharge Instructions: Apply over primary dressing as directed. Secured With: [employer] Size 5, 10 (yds) (Generic) 3 x Per Week/30 Days Compression Wrap: Kerlix Roll 4.5x3.1 (in/yd) 3 x Per Week/30 Days Discharge Instructions: Apply Kerlix and Coban compression as directed. Compression Wrap: Coban Self-Adherent Wrap 4x5 (in/yd) 3 x Per Week/30 Days Discharge Instructions: Apply over Kerlix as directed. Electronic Signature(s) Signed: 02/15/2023 11:46:05 AM By: Kalman Shan DO Entered By: Kalman Shan on 02/15/2023  10:40:25 -------------------------------------------------------------------------------- Problem List Details Patient Name: Date of Service: Vincent Eng R. 02/15/2023 9:30 A M Medical Record Number: [id number] Patient Account Number: [id number] Date of Birth/Sex: Treating RN: 1951/02/11 (72 y.o. M) Primary Care Provider: Maggie Font Other Clinician: Referring Provider: Treating Provider/Extender: Wilnette Kales in Treatment: 5 Active Problems ICD-10 Encounter Code Description Active Date MDM Diagnosis L97.815 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 02/01/2023 No Yes involvement without evidence of necrosis L97.812 Non-pressure chronic ulcer of other part of right lower leg with fat layer 02/01/2023 No Yes exposed I70.238 Atherosclerosis of native arteries of right leg with ulceration of other part of 01/11/2023 No Yes lower leg I87.311 Chronic venous hypertension (idiopathic) with ulcer of right lower extremity  02/01/2023 No Yes T81.31XA Disruption of external operation (surgical) wound, not elsewhere classified, 01/11/2023 No Yes initial encounter Inactive Problems Resolved Problems Electronic Signature(s) Signed: 02/15/2023 11:46:05 AM By: Horatio Pel ([id number]) Kalman Shan DO [phone].pdf Page 6 of 10 Signed: 02/15/2023 11:46:05 AM By: [name] Reichmann By: Kalman Shan on 02/15/2023 10:36:01 -------------------------------------------------------------------------------- Progress Note Details Patient Name: Date of Service: Vincent Eng R. 02/15/2023 9:30 A M Medical Record Number: [id number] Patient Account Number: [id number] Date of Birth/Sex: Treating RN: October 14, 1951 (72 y.o. M) Primary Care Provider: Maggie Font Other Clinician: Referring Provider: Treating Provider/Extender: Wilnette Kales in Treatment: 5 Subjective Chief  Complaint Information obtained from Patient 01/11/2023; arterial right lower extremity wound, surgical dehiscence to the medial right lower extremity History of Present Illness (HPI) 01/11/2023 [name] is a 72 year old male with a past medical history of peripheral arterial disease status post femoropopliteal bypass graft on 12/12/2022 and prostate cancer that presents to the clinic with 2 wounds to his right lower extremity. He states that in October 2023 he was wearing a boot that rubbed into his leg creating a wound. Since the wound was not healing he had ABIs completed that showed a right ABI of 0.35 and TBI of 0. [facility], Dr. Donnetta Hutching who did a femoropopliteal bypass graft on the right on 12/12/2022. He reports chronic pain to the wound site. He reports having staples removed to the medial right leg 1 week ago and the wound site has dehisced. He has not been dressing either of the wound beds. He currently denies systemic signs of infection. 1/25; patient presents for follow-up. He followed up with vein and vascular yesterday. It was noted that he had excellent biphasic flow in the posterior tibial of the right lower extremity. He has been using Hydrofera Blue and Santyl to the wound beds. He has chronic pain to the wound sites. 2/1; patient presents for follow-up. Patient had a PCR culture done at last clinic visit that showed E. coli, coagulase-negative staph, Candida parapsilosis and actinotignum schaalii. He is currently on ciprofloxacin that covers the E. coli. I will switch him over to Augmentin. We ordered [facility] antibiotic ointment and patient has paid for this and states it is coming in the mail tomorrow. For now he has been using Vashe wet-to-dry dressings. 2/6; patient presents for follow-up. He obtained the [facility] antibiotic in the mail. He started this Saturday. He has had no issues with it. He also continues to take Augmentin. He has a few days left. He  denies systemic signs of infection. 2/8; patient presents for follow-up. He had a wrap placed at last clinic visit and he tolerated this well. We have been using Keystone antibiotic ointment with Hydrofera Blue under Kerlix/Coban. We have ordered the wound VAC but have not heard back for insurance approval. He currently denies systemic signs of infection. 2/15; patient presents for follow-up. We have been using Hydrofera Blue and Keystone antibiotic ointment to the right lower extremity wounds under Kerlix/Coban. He has the wound VAC with him today. Unfortunately insurance is not approving home health. He will come in our clinic to have the [place] changed. 2/22; patient presents for follow-up. We have been using the wound VAC to the proximal right lower extremity wound and polymem and Keystone antibiotic ointment to the distal right lower extremity wound under Kerlix/Coban. He underwent arteriography on 11/20/2022 and subsequent right common femoral to posterior tibial bypass with composite Gore-T  and vein on 12/12/2022. During his follow-up with [facility] yesterday it was noted that he has thrombosed his ex composite graft. There is little benefit to attempt the redo bypass or lysis of the thrombectomy. He was explained that he had a high risk for amputation. He currently denies systemic signs of infection. Patient History Information obtained from Patient, Chart. Family History Unknown History. Social History Former smoker, Marital Status - Single, Alcohol Use - Never, Drug Use - No History, Caffeine Use - Rarely. Medical History Cardiovascular Patient has history of Hypertension, Peripheral Arterial Disease, Peripheral Venous Disease Hospitalization/Surgery History - femoral-tibial bypass graft right 12/12/22. - radical prostatectomy 2011. - tonsillectomy age 1. Medical A Surgical History Notes nd Cardiovascular Hyperlipidemia Gastrointestinal GERD Oncologic prostate ca [name], [name]  ([id number]) [phone].pdf Page 7 of 10 Objective Constitutional respirations regular, non-labored and within target range for patient.. Vitals Time Taken: 9:32 AM, Height: 71 in, Weight: 232 lbs, BMI: 32.4, Temperature: 97.6 F, Pulse: 73 bpm, Respiratory Rate: 18 breaths/min, Blood Pressure: 123/7 mmHg. Cardiovascular 2+ dorsalis pedis/posterior tibialis pulses. Psychiatric pleasant and cooperative. General Notes: Right lower extremity: T the anterior distal aspect there is an open wound with nonviable surface and granulation tissue. T the medial right leg o o there is an incision site with a large dehisced wound with non viable tissue and granulation tissue. no purulent drainage. 2+ pitting edema to the knee. No surrounding signs of soft tissue infection to any of the wound beds. Integumentary (Hair, Skin) Wound #1 status is Open. Original cause of wound was Trauma. The date acquired was: 09/13/2022. The wound has been in treatment 5 weeks. The wound is located on the Right,Anterior Lower Leg. The wound measures 5.5cm length x 6.5cm width x 0.2cm depth; 28.078cm^2 area and 5.616cm^3 volume. There is no tunneling or undermining noted. There is a large amount of serosanguineous drainage noted. The wound margin is distinct with the outline attached to the wound base. There is medium (34-66%) red, pink granulation within the wound bed. There is a medium (34-66%) amount of necrotic tissue within the wound bed including Eschar and Adherent Slough. The periwound skin appearance exhibited: Maceration. The periwound skin appearance did not exhibit: Callus, Crepitus, Excoriation, Induration, Rash, Scarring, Dry/Scaly, Atrophie Blanche, Cyanosis, Ecchymosis, Hemosiderin Staining, Mottled, Pallor, Rubor, Erythema. Periwound temperature was noted as No Abnormality. The periwound has tenderness on palpation. Wound #2 status is Open. Original cause of wound was Surgical Injury. The  date acquired was: 01/03/2023. The wound has been in treatment 5 weeks. The wound is located on the Right,Medial Lower Leg. The wound measures 7.4cm length x 3.4cm width x 1.5cm depth; 19.761cm^2 area and 29.641cm^3 volume. There is Fat Layer (Subcutaneous Tissue) exposed. There is no undermining noted, however, there is tunneling at 7:00 with a maximum distance of 3.4cm. There is a medium amount of serosanguineous drainage noted. The wound margin is distinct with the outline attached to the wound base. There is large (67- 100%) red, pink granulation within the wound bed. There is a small (1-33%) amount of necrotic tissue within the wound bed including Eschar and Adherent Slough. The periwound skin appearance exhibited: Maceration. The periwound skin appearance did not exhibit: Callus, Crepitus, Excoriation, Induration, Rash, Scarring, Dry/Scaly, Atrophie Blanche, Cyanosis, Ecchymosis, Hemosiderin Staining, Mottled, Pallor, Rubor, Erythema. Periwound temperature was noted as No Abnormality. The periwound has tenderness on palpation. 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 I debrided nonviable tissue and recommended continuing the wound VAC care. Unfortunately he has thrombosed his graft and now has dampened monophasic flow at the posterior tibial artery. The anterior distal wound is stable however tissue does not appear healthy. Recommended adding Santyl to the regiment with [facility]  antibiotic ointment and polymem under compression therapy. Based on the results he had a vein and vascular patient is at very high risk for amputation. He has a follow-up for this. Follow-up in 1 week. 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 There was a Chemical/Enzymatic/Mechanical debridement performed by Kalman Shan, DO. to remove Viable and Non-Viable tissue/material. Material removed includes Eschar, Subcutaneous Tissue, and Slough after achieving pain control using Lidocaine. Agent used was [employer]. A time out was conducted at 10:00, prior to the start of the procedure. A Minimum amount of bleeding was controlled with Pressure. The procedure was tolerated well with a pain level of 0 throughout and a pain level of 0 following the procedure. Post Debridement Measurements: 5.5cm length x 6.5cm width x 0.2cm depth; 5.616cm^3 volume. Character of Wound/Ulcer Post Debridement is improved. Severity of Tissue Post Debridement is: Fat layer exposed. Post procedure Diagnosis Wound #1: Same as Pre-Procedure Wound #2 Pre-procedure diagnosis of Wound #2 is a Dehisced Wound located on the Right,Medial Lower Leg . There was a Excisional Skin/Subcutaneous Tissue Debridement with a total area of 25.16 sq cm performed by Kalman Shan, DO. With the following instrument(s): Curette to remove Viable and Non-Viable tissue/material. Material removed includes Subcutaneous Tissue and Slough and after achieving pain control using Lidocaine. No specimens were taken. A time out was conducted at 10:00, prior to the start of the procedure. A Minimum amount of bleeding was controlled with Pressure. The procedure was tolerated well with a pain level of 0 throughout and a pain level of 0 following the procedure. Post Debridement Measurements: 7.4cm length x 3.4cm width x  1.5cm depth; 29.641cm^3 volume. Character of Wound/Ulcer Post Debridement is improved. Post procedure Diagnosis Wound #2: Same as Pre-Procedure [name], [name] ([id number]) [id number] Page 8 of 10 Plan Follow-up Appointments: Return Appointment in 1 week. - ***EXTRA TIME BLOCK EXTRA ROOM D/T WOUND VAC AND EXTENSIVE WOUND CARE*** w/ Dr. Cheron Every Rm # 9 02/22/2023 @ 0930 (already has appt.) Return Appointment in 2 weeks. - ***EXTRA TIME BLOCK EXTRA ROOM D/T WOUND VAC AND EXTENSIVE WOUND CARE*** w/ Dr. Heber [place] and Allayne Butcher Rm # 9 Thursday 03/01/23 @ 9:30 Nurse Visit: - MONDAY 02/26 @ 8:00 rM # 7 Other: - Bring in topical antibiotics to each appt time. Bring wound vac supplies every appt. Anesthetic: (In clinic) Topical Lidocaine 5% applied to wound bed Cellular or Tissue Based Products: Wound #1 Right,Anterior Lower Leg: Cellular or Tissue Based Product Type: - 02/01/2023 [employer] run insurance for approval. Bathing/ Shower/ Hygiene: May shower with protection but do not get wound dressing(s) wet. Protect dressing(s) with water repellant cover (for example, large plastic bag) or a cast cover and may then take shower. - May wash wounds with dial antibacterial soap in the shower if  you immediately dress wound after shower. Negative Presssure Wound Therapy: Wound #2 Right,Medial Lower Leg: Wound Vac to wound continuously at 110m/hg pressure - change twice a week at wound care center unless home health approved by insurance. Nurse visits every Monday, wound care encounters every Thursday Black Foam Edema Control - Lymphedema / SCD / Other: Elevate legs to the level of the heart or above for 30 minutes daily and/or when sitting for 3-4 times a day throughout the day. Avoid standing for long periods of time. Additional Orders / Instructions: Follow Nutritious Diet - increase protein intake with meals, drink boost or Glucerna, and use Juven. Home  Health: Admit to Home Health for skilled nursing wound care. May utilize formulary equivalent dressing for wound treatment orders unless otherwise specified. New wound care orders this week; continue Home Health for wound care. May utilize formulary equivalent dressing for wound treatment orders unless otherwise specified. - both wounds topical compounding antibiotics hydrofera blue abd pad, kerlix and coban until wound vac arrives next week then apply it to right medial lower leg. Continue the topical antibiotics hydrofera blue to right circumferential lower leg kerlix coban. [facility] Thursday / Saturday [facility] Home [facility] Orders/Instructions: WOUND #1: - Lower Leg Wound Laterality: Right, Anterior Cleanser: Soap and Water 3 x Per Week/30 Days Discharge Instructions: May shower and wash wound with dial antibacterial soap and water prior to dressing change. Cleanser: Wound Cleanser (Generic) 3 x Per Week/30 Days Discharge Instructions: Cleanse the wound with wound cleanser prior to applying a clean dressing using gauze sponges, not tissue or cotton balls. Topical: Keystone 3 x Per Week/30 Days Prim Dressing: PolyMem Non-Adhesive Dressing, 4x4 in 3 x Per Week/30 Days ary Discharge Instructions: Apply to wound bed as instructed Prim Dressing: Santyl Ointment 3 x Per Week/30 Days ary Discharge Instructions: Apply nickel thick amount to wound bed as instructed Prim Dressing: Keystone topical compounding antibiotics 3 x Per Week/30 Days ary Discharge Instructions: applied directly to wound bed. Secondary Dressing: ABD Pad, 5x9 (Generic) 3 x Per Week/30 Days Discharge Instructions: Apply over primary dressing as directed. Secondary Dressing: Zetuvit Plus 4x8 in 3 x Per Week/30 Days Discharge Instructions: Apply over primary dressing as directed. Secured With: [employer] Group[employer] 5, 10  (yds) (Generic) 3 x Per Week/30 Days Com pression Wrap: Kerlix Roll 4.5x3.1 (in/yd) 3 x Per Week/30 Days Discharge Instructions: Apply Kerlix and Coban compression as directed. Com pression Wrap: Coban Self-Adherent Wrap 4x5 (in/yd) 3 x Per Week/30 Days Discharge Instructions: Apply over Kerlix as directed. WOUND #2: - Lower Leg Wound Laterality: Right, Medial Cleanser: Soap and Water 3 x Per Week/30 Days Discharge Instructions: May shower and wash wound with dial antibacterial soap and water prior to dressing change. Cleanser: Wound Cleanser (Generic) 3 x Per Week/30 Days Discharge Instructions: Cleanse the wound with wound cleanser prior to applying a clean dressing using gauze sponges, not tissue or cotton balls. Prim Dressing: Keystone topical compounding antibiotics 3 x Per Week/30 Days ary Discharge Instructions: applied directly to wound bed. Prim Dressing: CONTINUE KEYSTONE TOPICAL ANTIBIOTICS AND HYDROFERA BLUE UNTIL THE WOUND VAC ARRIVES. ONCE ARRIVES APPLY ary WHITE AND BLACK FOAM TO THE MEDIAL LOWER LEG. 3 x Per Week/30 Days Prim Dressing: wound vac 3 x Per Week/30 Days ary Secondary Dressing: ABD Pad, 5x9 (Generic) 3 x Per Week/30 Days Discharge Instructions: Apply over primary dressing as directed. Secondary  Dressing: Woven Gauze Sponge, Non-Sterile 4x4 in 3 x Per Week/30 Days Discharge Instructions: APPLY OVER THE HYDROFERA BLUE TO ENSURE CONTACT WITH WOUND BED. Secondary Dressing: Zetuvit Plus 4x8 in 3 x Per Week/30 Days Discharge Instructions: Apply over primary dressing as directed. Secured With: [employer] Size 5, 10 (yds) (Generic) 3 x Per Week/30 Days Com pression Wrap: Kerlix Roll 4.5x3.1 (in/yd) 3 x Per Week/30 Days Discharge Instructions: Apply Kerlix and Coban compression as directed. Com pression Wrap: Coban Self-Adherent Wrap 4x5 (in/yd) 3 x Per Week/30 Days Discharge Instructions: Apply over Kerlix as directed. 1. In office sharp debridement 2. Wound VAC 3.  PolyMem with Santyl and Keystone antibiotic ointment under Kerlix/Coban 4. Follow-up in 1 week Electronic Signature(s) [name], [name] ([id number]) [id number] Page 9 of 10 Signed: 02/15/2023 11:46:05 AM By: Kalman Shan DO Entered By: Kalman Shan on 02/15/2023 10:47:12 -------------------------------------------------------------------------------- HxROS Details Patient Name: Date of Service: Vincent Eng R. 02/15/2023 9:30 A M Medical Record Number: [id number] Patient Account Number: [id number] Date of Birth/Sex: Treating RN: 09/30/1951 (72 y.o. M) Primary Care Provider: Maggie Font Other Clinician: Referring Provider: Treating Provider/Extender: Wilnette Kales in Treatment: 5 Information Obtained From Patient Chart Cardiovascular Medical History: Positive for: Hypertension; Peripheral Arterial Disease; Peripheral Venous Disease Past Medical History Notes: Hyperlipidemia Gastrointestinal Medical History: Past Medical History Notes: GERD Oncologic Medical History: Past Medical History Notes: prostate ca Immunizations Pneumococcal Vaccine: Received Pneumococcal Vaccination: Yes Received Pneumococcal Vaccination On or After 60th Birthday: Yes Implantable Devices None Hospitalization / Surgery History Type of Hospitalization/Surgery femoral-tibial bypass graft right 12/12/22 radical prostatectomy 2011 tonsillectomy age 26 Family and Social History Unknown History: Yes; Former smoker; Marital Status - Single; Alcohol Use: Never; Drug Use: No History; Caffeine Use: Rarely; Financial Concerns: No; Food, Clothing or Shelter Needs: No; Support System Lacking: No; Transportation Concerns: No Electronic Signature(s) Signed: 02/15/2023 11:46:05 AM By: Kalman Shan DO Entered By: Kalman Shan on 02/15/2023  10:39:25 -------------------------------------------------------------------------------- SuperBill Details Patient Name: Date of Service: Vincent Limber. 02/15/2023 Medical Record Number: [id number] Patient Account Number: [id number] Date of Birth/Sex: Treating RN: 05/31/1951 (72 y.o. 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 without evidence of necrosis L97.812 Non-pressure chronic ulcer of other part of right lower leg with fat layer exposed I70.238 Atherosclerosis of native arteries of right leg with ulceration of other part of lower leg I87.311 Chronic venous hypertension (idiopathic) with ulcer of right lower extremity T81.31XA Disruption of external operation (surgical) wound, not elsewhere classified, initial encounter Facility Procedures : CPT4 Code Description: [id number] [id number] - DEB SUBQ TISSUE 20 SQ CM/< ICD-10 Diagnosis Description L97.815 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 wi Modifier: thout evidence Quantity: 1 of necrosis : CPT4 Code Description: [id number] 11045 - DEB SUBQ TISS EA ADDL 20CM ICD-10 Diagnosis Description L97.815 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 wi Modifier: thout evidence Quantity: 1 of necrosis : CPT4 Code Description: [id number] - WOUND VAC-50 SQ CM OR LESS Modifier: Quantity: 1 : CPT4 Code Description: [id number] 97602 - DEBRIDE W/O ANES NON SELECT Modifier: Quantity: 1 Physician Procedures : CPT4 Code Description Modifier [id number] - WC PHYS LEVEL  3 - EST PT 25 ICD-10 Diagnosis Description L97.812  Non-pressure chronic ulcer of other part of right lower leg with fat layer exposed I70.238 Atherosclerosis of native arteries of right leg  with ulceration of other part of lower leg I87.311 Chronic venous hypertension (idiopathic) with ulcer of right lower extremity Quantity: 1 : [id number] 11042 - WC PHYS SUBQ TISS 20 SQ CM ICD-10 Diagnosis Description L97.815 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

## 2023-02-22 ENCOUNTER — Encounter (HOSPITAL_BASED_OUTPATIENT_CLINIC_OR_DEPARTMENT_OTHER): Payer: Medicare Other | Admitting: Internal Medicine

## 2023-02-22 ENCOUNTER — Ambulatory Visit (INDEPENDENT_AMBULATORY_CARE_PROVIDER_SITE_OTHER): Payer: Medicare Other | Admitting: Physician Assistant

## 2023-02-22 ENCOUNTER — Encounter: Payer: Self-pay | Admitting: Physician Assistant

## 2023-02-22 ENCOUNTER — Encounter: Payer: Self-pay | Admitting: Radiology

## 2023-02-22 ENCOUNTER — Telehealth: Payer: Self-pay

## 2023-02-22 DIAGNOSIS — I70238 Atherosclerosis of native arteries of right leg with ulceration of other part of lower right leg: Secondary | ICD-10-CM

## 2023-02-22 DIAGNOSIS — L97812 Non-pressure chronic ulcer of other part of right lower leg with fat layer exposed: Secondary | ICD-10-CM

## 2023-02-22 DIAGNOSIS — L97815 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 without evidence of necrosis: Secondary | ICD-10-CM

## 2023-02-22 DIAGNOSIS — T8131XA Disruption of external operation (surgical) wound, not elsewhere classified, initial encounter: Secondary | ICD-10-CM | POA: Diagnosis not present

## 2023-02-22 DIAGNOSIS — I70221 Atherosclerosis of native arteries of extremities with rest pain, right leg: Secondary | ICD-10-CM

## 2023-02-22 NOTE — Progress Notes (Signed)
[facility] [place] HISTORY AND PHYSICAL   History of Present Illness:  Patient is a 72 y.o. year old male who presents for evaluation of chronic non healing wound.  He has a large wound at the distal dorsum of his ankle and proximal foot. He underwent arteriography on 11/20/2022 and subsequent right common femoral to posterior tibial bypass with composite Gore-Tex and vein on 12/12/2022.   He also has had a separated the area of his medial calf wound. He reports that he is now using a wound VAC at the [facility] and is also being treated with calm pounding pharmacy compound.   He was last seen by Dr. Donnetta Hutching 02/14/23  He apparently has thrombosed his composite graft since his last visit on 01/17/2023.   Dr. Donnetta Hutching called Dr. Donzetta Matters to discuss this issue.  They both agree that would be very little benefit from attempts at redo bypass or lysis or thrombectomy.     He was seen in the wound care clinic today for follow up and we were asked to see the patient urgently.  He denies change in wound size, fever, chills or increased pain.  He states the wounds are no different since he saw Dr. Donnetta Hutching.    Past Medical History:  Diagnosis Date   GERD (gastroesophageal reflux disease)    History of kidney stones    Hyperlipidemia    Hypertension    Prostate cancer [facility]) 2011   radiation june 2020   Umbilical hernia    Wears dentures    full   Wears glasses    Wears partial dentures    lower    Past Surgical History:  Procedure Laterality Date   ABDOMINAL AORTOGRAM W/LOWER EXTREMITY N/A 11/20/2022   Procedure: ABDOMINAL AORTOGRAM W/LOWER EXTREMITY;  Surgeon: Waynetta Sandy, MD;  Location: [place] [facility];  Service: Cardiovascular;  Laterality: N/A;   COLONOSCOPY N/A 08/25/2020   Procedure: COLONOSCOPY;  Surgeon: Daneil Dolin, MD;  [facility];  Service: Endoscopy;  Laterality: N/A;  9:30   COLONOSCOPY     CYSTOSCOPY WITH LITHOLAPAXY N/A  05/18/2021   Procedure: CYSTOSCOPY WITH LITHOLAPAXY;  Surgeon: Ceasar Mons, MD;  [facility];  Service: Urology;  Laterality: N/A;  ONLY NEEDS  30 MIN   FEMORAL-TIBIAL BYPASS GRAFT Right 12/12/2022   Procedure: RIGHT COMMON FEMORAL-POSTERIOR TIBIAL ARTERY BYPASS WITH VEIN HARVESTING OF THE GREATER SAPHENOUS VEIN AND FEMORAL ENDARTERECTOMY WITH COMPOSITE GRAFT;  Surgeon: Waynetta Sandy, MD;  Location: [facility];  Service: Vascular;  Laterality: Right;   POLYPECTOMY  08/25/2020   Procedure: POLYPECTOMY;  Surgeon: Daneil Dolin, MD;  [facility];  Service: Endoscopy;;   ROBOT ASSISTED LAPAROSCOPIC RADICAL PROSTATECTOMY  2011   TONSILLECTOMY  age 4   and adenoids removed    ROS:   General:  No weight loss, Fever, chills  HEENT: No recent headaches, no nasal bleeding, no visual changes, no sore throat  Neurologic: No dizziness, blackouts, seizures. No recent symptoms of stroke or mini- stroke. No recent episodes of slurred speech, or temporary blindness.  Cardiac: No recent episodes of chest pain/pressure, no shortness of breath at rest.  No shortness of breath with exertion.  Denies history of atrial fibrillation or irregular heartbeat  Vascular: No history of rest pain in feet.  No history of claudication.  No history of non-healing ulcer, No history of DVT   Pulmonary: No home oxygen, no productive cough,  no hemoptysis,  No asthma or wheezing  Musculoskeletal:  '[ ]'$  Arthritis, '[ ]'$  Low back pain,  '[ ]'$  Joint pain  Hematologic:No history of hypercoagulable state.  No history of easy bleeding.  No history of anemia  Gastrointestinal: No hematochezia or melena,  No gastroesophageal reflux, no trouble swallowing  Urinary: '[ ]'$  chronic Kidney disease, '[ ]'$  on HD - '[ ]'$  MWF or '[ ]'$  TTHS, '[ ]'$  Burning with urination, '[ ]'$  Frequent urination, '[ ]'$  Difficulty urinating;   Skin: No rashes  Psychological: No history of anxiety,  No history of  depression  Social History Social History   Tobacco Use   Smoking status: Former    Packs/day: 0.25    Years: 21.00    Total pack years: 5.25    Types: Cigarettes    Quit date: 11/29/2022    Years since quitting: 0.2   Smokeless tobacco: Never  Vaping Use   Vaping Use: Never used  Substance Use Topics   Alcohol use: Never   Drug use: Never    Family History Family History  Problem Relation Age of Onset   Gastric cancer Mother    Prostate cancer Father    Breast cancer Neg Hx    Pancreatic cancer Neg Hx     Allergies  Allergies  Allergen Reactions   Shellfish Allergy Swelling     Current Outpatient Medications  Medication Sig Dispense Refill   aspirin EC 81 MG tablet Take 81 mg by mouth daily.     atorvastatin (LIPITOR) 40 MG tablet Take 1 tablet (40 mg total) by mouth every morning. 30 tablet 3   esomeprazole (NEXIUM) 20 MG capsule Take 20 mg by mouth daily.     furosemide (LASIX) 20 MG tablet Take 20 mg by mouth daily.     gabapentin (NEURONTIN) 100 MG capsule Take 1 capsule (100 mg total) by mouth 3 (three) times daily. (Patient taking differently: Take 100 mg by mouth at bedtime as needed (Nerve pain).) 90 capsule 3   lisinopril-hydrochlorothiazide (PRINZIDE,ZESTORETIC) 10-12.5 MG tablet Take 1 tablet by mouth every other day.      meloxicam (MOBIC) 15 MG tablet Take 1 tablet by mouth once daily 30 tablet 0   metoprolol tartrate (LOPRESSOR) 25 MG tablet Take 1/2 (one-half) tablet by mouth twice daily 30 tablet 0   Multiple Vitamins-Minerals (CENTRUM SILVER PO) Take 1 tablet by mouth daily.      oxybutynin (DITROPAN-XL) 10 MG 24 hr tablet Take 10 mg by mouth daily.     oxyCODONE-acetaminophen (PERCOCET/ROXICET) 5-325 MG tablet Take 1 tablet by mouth every 6 (six) hours as needed for moderate pain. 20 tablet 0   potassium chloride SA (KLOR-CON M) 20 MEQ tablet Take 20 mEq by mouth daily. (Patient not taking: Reported on 02/14/2023)     SPIKEVAX syringe       Current Facility-Administered Medications  Medication Dose Route Frequency Provider Last Rate Last Admin   betamethasone acetate-betamethasone sodium phosphate (CELESTONE) injection 3 mg  3 mg Intra-articular Once Daylene Katayama M, DPM       betamethasone acetate-betamethasone sodium phosphate (CELESTONE) injection 3 mg  3 mg Intra-articular Once Edrick Kins, DPM        Physical Examination  There were no vitals filed for this visit.  There is no height or weight on file to calculate BMI.  General:  Alert and oriented, no acute distress HEENT: Normal, he ambulated in the office with the assistance of a cne Neck: No bruit  or JVD Pulmonary: Clear to auscultation bilaterally Cardiac: Regular Rate and Rhythm without murmur Abdomen: Soft, non-tender, non-distended, no mass, no scars Skin: No rash     Extremity Pulses:  No distal doppler signals right LE Musculoskeletal: minimal left LE edema  Neurologic: minimal motor in the toes right foot     ASSESSMENT/PLAN:   PAD with non healing wounds He underwent arteriography on 11/20/2022 and subsequent right common femoral to posterior tibial bypass with composite Gore-Tex and vein on 12/12/2022.  His wounds have been followed by [facility].   He has no doppler signals in the right foot, minimal toe motor.  He has no pain to speak of.  He is ambulatory.  He is not ready to commit to an amputation.  He will continue with wound care and keep his f/u with Dr. Donzetta Matters.    He does not have further re vascularization options. He will have to consider an amputation.  If he develops increased pain that is not tolerable or infection he will go to [place] PA-C [facility]: [phone]  MD in [facility]

## 2023-02-22 NOTE — Telephone Encounter (Signed)
[name] with [facility] called stating that the pt is there with a significant change to his RLE. She states the wounds are progressively worsening, his foot is cold, there is no palpable pulse or doppler signal, and cap refill is 4-5 seconds at best.  Spoke with PA's who advised that the pt go to the ED if his pain was severe and he was ready for the probable amputation that the pt and Dr. Donnetta Hutching discussed on 2/21. If he wanted to wait, he could keep his 3/6 appt with Dr. Donzetta Matters.  Relayed this info to [facility]. Dr Heber Pelzer placed on phone and stated that she didn't think the pt's leg was quite ready for amputation. The pt did not want to go to the ED and she was recommending a re-evaluation to which the pt wanted as well. Triage appt scheduled, PA's made aware. Confirmed understanding.

## 2023-02-24 NOTE — Progress Notes (Signed)
[name], [name] ([id number]) [id number] Page 1 of 8 Visit Report for 02/22/2023 Chief Complaint Document Details Patient Name: Date of Service: [name] 02/22/2023 9:30 A M Medical Record Number: [id number] Patient Account Number: [id number] Date of Birth/Sex: Treating RN: [birth year] (72 y.o. M) Primary Care Provider: Maggie Font Other Clinician: Referring Provider: Treating Provider/Extender: Wilnette Kales in Treatment: 6 Information Obtained from: Patient Chief Complaint 01/11/2023; arterial right lower extremity wound, surgical dehiscence to the medial right lower extremity Electronic Signature(s) Signed: 02/22/2023 12:51:55 PM By: Kalman Shan DO Entered By: Kalman Shan on 02/22/2023 10:09:22 -------------------------------------------------------------------------------- HPI Details Patient Name: Date of Service: Vincent Eng R. 02/22/2023 9:30 A M Medical Record Number: [id number] Patient Account Number: [id number] Date of Birth/Sex: Treating RN: 1951-11-24 (72 y.o. M) Primary Care Provider: Maggie Font Other Clinician: Referring Provider: Treating Provider/Extender: Wilnette Kales in Treatment: 6 History of Present Illness HPI Description: 01/11/2023 Mr. Yazan Bethancourt is a 72 year old male with a past medical history of peripheral arterial disease status post femoropopliteal bypass graft on 12/12/2022 and prostate cancer that presents to the clinic with 2 wounds to his right lower extremity. He states that in October 2023 he was wearing a boot that rubbed into his leg creating a wound. Since the wound was not healing he had ABIs completed that showed a right ABI of 0.35 and TBI of 0. He [facility], Dr. Donnetta Hutching who did a femoropopliteal bypass graft on the right on 12/12/2022. He reports chronic pain to the wound site. He reports having staples removed to  the medial right leg 1 week ago and the wound site has dehisced. He has not been dressing either of the wound beds. He currently denies systemic signs of infection. 1/25; patient presents for follow-up. He followed up with vein and vascular yesterday. It was noted that he had excellent biphasic flow in the posterior tibial of the right lower extremity. He has been using Hydrofera Blue and Santyl to the wound beds. He has chronic pain to the wound sites. 2/1; patient presents for follow-up. Patient had a PCR culture done at last clinic visit that showed E. coli, coagulase-negative staph, Candida parapsilosis and actinotignum schaalii. He is currently on ciprofloxacin that covers the E. coli. I will switch him over to Augmentin. We ordered [facility] antibiotic ointment and patient has paid for this and states it is coming in the mail tomorrow. For now he has been using Vashe wet-to-dry dressings. 2/6; patient presents for follow-up. He obtained the [facility] antibiotic in the mail. He started this Saturday. He has had no issues with it. He also continues to take Augmentin. He has a few days left. He denies systemic signs of infection. 2/8; patient presents for follow-up. He had a wrap placed at last clinic visit and he tolerated this well. We have been using Keystone antibiotic ointment with Hydrofera Blue under Kerlix/Coban. We have ordered the wound VAC but have not heard back for insurance approval. He currently denies systemic signs of infection. 2/15; patient presents for follow-up. We have been using Hydrofera Blue and Keystone antibiotic ointment to the right lower extremity wounds under Kerlix/Coban. He has the wound VAC with him today. Unfortunately insurance is not approving home health. He will come in our clinic to have the [place] changed. 2/22; patient presents for follow-up. We have been using the wound VAC to the proximal right lower extremity wound and polymem and  Keystone  antibiotic ointment to the distal right lower extremity wound under Kerlix/Coban. He underwent arteriography on 11/20/2022 and subsequent right common femoral to posterior tibial bypass with composite Gore-T and vein on 12/12/2022. During his follow-up with [facility] yesterday it was noted that he has thrombosed his ex composite graft. There is little benefit to attempt the redo bypass or lysis of the thrombectomy. He was explained that he had a high risk for amputation. He currently denies systemic signs of infection. 2/29; patient presents for follow-up. We have been using PolyMem and Keystone with Santyl to the distal right lower extremity wound under Kerlix/Coban. We have been using the VAC to the proximal right extremity. Today states he is having more pain to the distal wound. His foot is cold today and no pulses heard on Doppler. His vein and vascular office was called and he was able to be scheduled today for 11:00 For further evaluation. Electronic Signature(s) Signed: 02/22/2023 12:51:55 PM By: Kalman Shan DO Entered By: Kalman Shan on 02/22/2023 10:10:16 Danford Bad ([id number] Page 2 of 8 -------------------------------------------------------------------------------- Physical Exam Details Patient Name: Date of Service: [name] 02/22/2023 9:30 A M Medical Record Number: [id number] Patient Account Number: [id number] Date of Birth/Sex: Treating RN: [birth year] (72 y.o. M) Primary Care Provider: Maggie Font Other Clinician: Referring Provider: Treating Provider/Extender: Wilnette Kales in Treatment: 6 Constitutional respirations regular, non-labored and within target range for patient.. Cardiovascular 2+ dorsalis pedis/posterior tibialis pulses. Psychiatric pleasant and cooperative. Notes Right lower extremity: T the anterior distal aspect there is an open wound with nonviable surface  and granulation tissue. T the medial right leg there is an o o incision site with a large dehisced wound with non viable tissue and granulation tissue. no purulent drainage. 2+ pitting edema to the knee. No surrounding signs of soft tissue infection to any of the wound beds. Electronic Signature(s) Signed: 02/22/2023 12:51:55 PM By: Kalman Shan DO Entered By: Kalman Shan on 02/22/2023 10:13:27 -------------------------------------------------------------------------------- Physician Orders Details Patient Name: Date of Service: Vincent Eng R. 02/22/2023 9:30 A M Medical Record Number: [id number] Patient Account Number: [id number] Date of Birth/Sex: Treating RN: May 10, 1951 (71 y.o. [name], [name] Primary Care Provider: Maggie Font Other Clinician: Referring Provider: Treating Provider/Extender: Wilnette Kales in Treatment: 6 Verbal / Phone Orders: No Diagnosis Coding Follow-up Appointments ppointment in 1 week. - ***EXTRA TIME BLOCK EXTRA ROOM D/T WOUND VAC AND EXTENSIVE WOUND CARE*** Return A w/ Dr. Cheron Every Rm # 9 Thursday 03/01/23 @ 9:30 ppointment in 2 weeks. - ***EXTRA TIME BLOCK EXTRA ROOM D/T WOUND VAC AND EXTENSIVE WOUND CARE*** Return A w/ Dr. Heber O'Brien and Allayne Butcher Rm # 9 Nurse Visit: - MONDAY 02/26/23 @ 8:00 Rm # 8 w/ bobbi Other: - Go to Vein and Vascular at 11:00 today for appointment!!! Bring in topical antibiotics to each appt time. Bring wound vac supplies every appt. Anesthetic (In clinic) Topical Lidocaine 5% applied to wound bed Cellular or Tissue Based Products Wound #1 Right,Anterior Lower Leg Cellular or Tissue Based Product Type: - 02/01/2023 organ[employer] run insurance for approval. Bathing/ Shower/ Hygiene May shower with protection but do not get wound dressing(s) wet. Protect dressing(s) with water repellant cover (for example, large plastic bag) or a cast cover and may then take shower. - May wash wounds  with dial antibacterial soap in the shower if you immediately dress wound after shower. Negative Presssure Wound Therapy Wound #  2 Right,Medial Lower Leg Wound Vac to wound continuously at 159m/hg pressure - change twice a week at wound care center unless home health approved by insurance. Nurse visits every Monday, wound care encounters every Thursday Black Foam RKRU, NOVICKR ([id number] Page 3 of 8 Edema Control - Lymphedema / SCD / Other Elevate legs to the level of the heart or above for 30 minutes daily and/or when sitting for 3-4 times a day throughout the day. Avoid standing for long periods of time. Additional Orders / Instructions Follow Nutritious Diet - increase protein intake with meals, drink boost or Glucerna, and use Juven. Wound Treatment Wound #1 - Lower Leg Wound Laterality: Right, Anterior Cleanser: Soap and Water 3 x Per Week/30 Days Discharge Instructions: May shower and wash wound with dial antibacterial soap and water prior to dressing change. Cleanser: Wound Cleanser (Generic) 3 x Per Week/30 Days Discharge Instructions: Cleanse the wound with wound cleanser prior to applying a clean dressing using gauze sponges, not tissue or cotton balls. Topical: Keystone 3 x Per Week/30 Days Prim Dressing: Hydrofera Blue Ready Transfer Foam, 4x5 (in/in) (Generic) 3 x Per Week/30 Days ary Discharge Instructions: Apply to wound bed as instructed Prim Dressing: Keystone topical compounding antibiotics 3 x Per Week/30 Days ary Discharge Instructions: applied directly to wound bed. Secondary Dressing: ABD Pad, 5x9 (Generic) 3 x Per Week/30 Days Discharge Instructions: Apply over primary dressing as directed. Secondary Dressing: Zetuvit Plus 4x8 in 3 x Per Week/30 Days Discharge Instructions: Apply over primary dressing as directed. Secured With: SBorders GroupSize 5, 10 (yds) (Generic) 3 x Per Week/30 Days Compression Wrap: Kerlix  Roll 4.5x3.1 (in/yd) 3 x Per Week/30 Days Discharge Instructions: Apply Kerlix and Coban compression as directed. Compression Wrap: Coban Self-Adherent Wrap 4x5 (in/yd) 3 x Per Week/30 Days Discharge Instructions: Apply over Kerlix as directed. Wound #2 - Lower Leg Wound Laterality: Right, Medial Cleanser: Soap and Water 3 x Per Week/30 Days Discharge Instructions: May shower and wash wound with dial antibacterial soap and water prior to dressing change. Cleanser: Wound Cleanser (Generic) 3 x Per Week/30 Days Discharge Instructions: Cleanse the wound with wound cleanser prior to applying a clean dressing using gauze sponges, not tissue or cotton balls. Prim Dressing: Keystone topical compounding antibiotics 3 x Per Week/30 Days ary Discharge Instructions: applied directly to wound bed. Prim Dressing: CONTINUE KEYSTONE TOPICAL ANTIBIOTICS AND HYDROFERA BLUE UNTIL THE WOUND VAC ARRIVES. ONCE ARRIVES APPLY ary WHITE AND BLACK FOAM TO THE MEDIAL LOWER LEG. 3 x Per Week/30 Days Prim Dressing: wound vac ary 3 x Per Week/30 Days Secondary Dressing: ABD Pad, 5x9 (Generic) 3 x Per Week/30 Days Discharge Instructions: Apply over primary dressing as directed. Secondary Dressing: Woven Gauze Sponge, Non-Sterile 4x4 in 3 x Per Week/30 Days Discharge Instructions: APPLY OVER THE HYDROFERA BLUE TO ENSURE CONTACT WITH WOUND BED. Secondary Dressing: Zetuvit Plus 4x8 in 3 x Per Week/30 Days Discharge Instructions: Apply over primary dressing as directed. Secured With: SBorders GroupSize 5, 10 (yds) (Generic) 3 x Per Week/30 Days Compression Wrap: Kerlix Roll 4.5x3.1 (in/yd) 3 x Per Week/30 Days Discharge Instructions: Apply Kerlix and Coban compression as directed. Compression Wrap: Coban Self-Adherent Wrap 4x5 (in/yd) 3 x Per Week/30 Days Discharge Instructions: Apply over Kerlix as directed. Electronic Signature(s) Signed: 02/22/2023 12:51:55 PM By: HKalman ShanDO Signed: 02/22/2023 4:19:23 PM By:  BRhae HammockRN Entered By: BRhae Hammockon 02/22/2023 10:31:15 RDanford Bad([id number] Page 4 of 8 -------------------------------------------------------------------------------- Problem List Details  Patient Name: Date of Service: [name] 02/22/2023 9:30 A M Medical Record Number: [id number] Patient Account Number: [id number] Date of Birth/Sex: Treating RN: 1951-09-13 (72 y.o. M) Primary Care Provider: Maggie Font Other Clinician: Referring Provider: Treating Provider/Extender: Wilnette Kales in Treatment: 6 Active Problems ICD-10 Encounter Code Description Active Date MDM Diagnosis L97.815 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 02/01/2023 No Yes involvement without evidence of necrosis L97.812 Non-pressure chronic ulcer of other part of right lower leg with fat layer 02/01/2023 No Yes exposed I70.238 Atherosclerosis of native arteries of right leg with ulceration of other part of 01/11/2023 No Yes lower leg I87.311 Chronic venous hypertension (idiopathic) with ulcer of right lower extremity 02/01/2023 No Yes T81.31XA Disruption of external operation (surgical) wound, not elsewhere classified, 01/11/2023 No Yes initial encounter Inactive Problems Resolved Problems Electronic Signature(s) Signed: 02/22/2023 12:51:55 PM By: Kalman Shan DO Entered By: Kalman Shan on 02/22/2023 10:09:11 -------------------------------------------------------------------------------- Progress Note Details Patient Name: Date of Service: Vincent Eng R. 02/22/2023 9:30 A M Medical Record Number: [id number] Patient Account Number: [id number] Date of Birth/Sex: Treating RN: 01/17/1951 (72 y.o. M) Primary Care Provider: Maggie Font Other Clinician: Referring Provider: Treating Provider/Extender: Wilnette Kales in Treatment:  6 Subjective Chief Complaint Information obtained from Patient 01/11/2023; arterial right lower extremity wound, surgical dehiscence to the medial right lower extremity History of Present Illness (HPI) 01/11/2023 Mr. Juliann Keeley is a 72 year old male with a past medical history of peripheral arterial disease status post femoropopliteal bypass graft on 12/12/2022 and prostate cancer that presents to the clinic with 2 wounds to his right lower extremity. He states that in October 2023 he was wearing a boot that rubbed into his leg creating a wound. Since the wound was not healing he had ABIs completed that showed a right ABI of 0.35 and TBI of 0. He was [facility], Dr. Donnetta Hutching who did a femoropopliteal bypass graft on the right on 12/12/2022. He reports chronic pain to the wound site. He reports having staples removed to the CYLAN, FOUCAULT ([id number]) [phone].pdf Page 5 of 8 medial right leg 1 week ago and the wound site has dehisced. He has not been dressing either of the wound beds. He currently denies systemic signs of infection. 1/25; patient presents for follow-up. He followed up with vein and vascular yesterday. It was noted that he had excellent biphasic flow in the posterior tibial of the right lower extremity. He has been using Hydrofera Blue and Santyl to the wound beds. He has chronic pain to the wound sites. 2/1; patient presents for follow-up. Patient had a PCR culture done at last clinic visit that showed E. coli, coagulase-negative staph, Candida parapsilosis and actinotignum schaalii. He is currently on ciprofloxacin that covers the E. coli. I will switch him over to Augmentin. We ordered [facility] antibiotic ointment and patient has paid for this and states it is coming in the mail tomorrow. For now he has been using Vashe wet-to-dry dressings. 2/6; patient presents for follow-up. He obtained the [facility] antibiotic in the mail. He started  this Saturday. He has had no issues with it. He also continues to take Augmentin. He has a few days left. He denies systemic signs of infection. 2/8; patient presents for follow-up. He had a wrap placed at last clinic visit and he tolerated this well. We have been using Keystone antibiotic ointment with Hydrofera  Blue under Kerlix/Coban. We have ordered the wound VAC but have not heard back for insurance approval. He currently denies systemic signs of infection. 2/15; patient presents for follow-up. We have been using Hydrofera Blue and Keystone antibiotic ointment to the right lower extremity wounds under Kerlix/Coban. He has the wound VAC with him today. Unfortunately insurance is not approving home health. He will come in our clinic to have the [place] changed. 2/22; patient presents for follow-up. We have been using the wound VAC to the proximal right lower extremity wound and polymem and Keystone antibiotic ointment to the distal right lower extremity wound under Kerlix/Coban. He underwent arteriography on 11/20/2022 and subsequent right common femoral to posterior tibial bypass with composite Gore-T and vein on 12/12/2022. During his follow-up with [facility] yesterday it was noted that he has thrombosed his ex composite graft. There is little benefit to attempt the redo bypass or lysis of the thrombectomy. He was explained that he had a high risk for amputation. He currently denies systemic signs of infection. 2/29; patient presents for follow-up. We have been using PolyMem and Keystone with Santyl to the distal right lower extremity wound under Kerlix/Coban. We have been using the VAC to the proximal right extremity. Today states he is having more pain to the distal wound. His foot is cold today and no pulses heard on Doppler. His vein and vascular office was called and he was able to be scheduled today for 11:00 For further evaluation. Patient History Information obtained from Patient, Chart. Family  History Unknown History. Social History Former smoker, Marital Status - Single, Alcohol Use - Never, Drug Use - No History, Caffeine Use - Rarely. Medical History Cardiovascular Patient has history of Hypertension, Peripheral Arterial Disease, Peripheral Venous Disease Hospitalization/Surgery History - femoral-tibial bypass graft right 12/12/22. - radical prostatectomy 2011. - tonsillectomy age 41. Medical A Surgical History Notes nd Cardiovascular Hyperlipidemia Gastrointestinal GERD Oncologic prostate ca Objective Constitutional respirations regular, non-labored and within target range for patient.. Vitals Time Taken: 9:28 AM, Height: 71 in, Weight: 232 lbs, BMI: 32.4, Temperature: 99.4 F, Pulse: 107 bpm, Respiratory Rate: 17 breaths/min, Blood Pressure: 115/58 mmHg. Cardiovascular 2+ dorsalis pedis/posterior tibialis pulses. Psychiatric pleasant and cooperative. General Notes: Right lower extremity: T the anterior distal aspect there is an open wound with nonviable surface and granulation tissue. T the medial right leg o o there is an incision site with a large dehisced wound with non viable tissue and granulation tissue. no purulent drainage. 2+ pitting edema to the knee. No surrounding signs of soft tissue infection to any of the wound beds. Integumentary (Hair, Skin) Wound #1 status is Open. Original cause of wound was Trauma. The date acquired was: 09/13/2022. The wound has been in treatment 6 weeks. The wound is located on the Right,Anterior Lower Leg. The wound measures 14cm length x 19cm width x 0.1cm depth; 208.916cm^2 area and 20.892cm^3 volume. There is Fat Layer (Subcutaneous Tissue) exposed. There is no tunneling or undermining noted. There is a large amount of serosanguineous drainage noted. The wound margin is distinct with the outline attached to the wound base. There is small (1-33%) red, pink granulation within the wound bed. There is a large (67-100%) amount of  necrotic tissue within the wound bed including Adherent Slough. The periwound skin appearance exhibited: Maceration, Ecchymosis. The periwound skin appearance did not exhibit: Callus, Crepitus, Excoriation, Induration, Rash, Scarring, Dry/Scaly, Atrophie Blanche, Cyanosis, Hemosiderin Staining, KEYSHAUN, HOFACKER R ([id number]) [phone].pdf Page 6 of 8 Mottled, Pallor, Rubor, Erythema. Periwound  temperature was noted as Cool/Cold. The periwound has tenderness on palpation. Wound #2 status is Open. Original cause of wound was Surgical Injury. The date acquired was: 01/03/2023. The wound has been in treatment 6 weeks. The wound is located on the Right,Medial Lower Leg. The wound measures 7cm length x 3cm width x 1cm depth; 16.493cm^2 area and 16.493cm^3 volume. There is Fat Layer (Subcutaneous Tissue) exposed. There is no tunneling or undermining noted. There is a medium amount of serosanguineous drainage noted. The wound margin is distinct with the outline attached to the wound base. There is medium (34-66%) red, pink granulation within the wound bed. There is a medium (34-66%) amount of necrotic tissue within the wound bed including Adherent Slough. The periwound skin appearance exhibited: Scarring, Maceration, Ecchymosis. The periwound skin appearance did not exhibit: Callus, Crepitus, Excoriation, Induration, Rash, Dry/Scaly, Atrophie Blanche, Cyanosis, Hemosiderin Staining, Mottled, Pallor, Rubor, Erythema. 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 Unclear if this is from drainage. At this time I recommended Keystone antibiotic ointment with Hydrofera Blue and changing this daily with Tubigrip. He is surgical incision site is stable. He states he is having more pain today. His foot is cold and cannot appreciate pulses on Doppler. At this time I recommended being evaluated by vein and vascular versus the ED. We were able to get him an appointment for 11:00 today and he was agreeable to being evaluated. He may follow-up in our clinic in a week pending their recommendations. Plan Follow-up Appointments: Return Appointment in 1 week. - ***EXTRA TIME BLOCK EXTRA ROOM D/T WOUND VAC AND EXTENSIVE WOUND CARE*** w/ Dr. Cheron Every Rm # 9 Thursday 03/01/23 @ 9:30 Return Appointment in 2 weeks. - ***EXTRA TIME BLOCK EXTRA ROOM D/T WOUND VAC AND EXTENSIVE WOUND CARE*** w/ Dr. Heber Saranap and Allayne Butcher Rm # 9 Nurse Visit: - MONDAY 02/26/23 @ 8:00 Rm # 8 w/ bobbi Other: - Go to Vein and Vascular at 11:00 today for appointment!!! Bring in topical antibiotics to each appt time. Bring wound vac supplies every appt. Anesthetic: (In clinic) Topical Lidocaine 5% applied to wound bed Cellular or Tissue Based Products: Wound #1 Right,Anterior Lower Leg: Cellular or Tissue Based Product Type: - 02/01/2023 [employer] run insurance for approval. Bathing/ Shower/ Hygiene: May shower with protection but do not get wound dressing(s) wet. Protect dressing(s) with water repellant cover (for example, large plastic bag) or a cast cover and may then take shower. - May wash wounds with dial antibacterial soap in the shower if you immediately dress wound after shower. Negative Presssure Wound Therapy: Wound #2 Right,Medial Lower Leg: Wound Vac to wound continuously at 179m/hg pressure - change twice a week at wound care center unless home health approved by insurance.  Nurse visits every Monday, wound care encounters every Thursday Black Foam Edema Control - Lymphedema / SCD / Other: Elevate legs to the level of the heart or above for 30 minutes daily and/or when sitting for 3-4 times a day throughout the day. Avoid standing for long periods of time. Additional Orders / Instructions: Follow  Nutritious Diet - increase protein intake with meals, drink boost or Glucerna, and use Juven. WOUND #1: - Lower Leg Wound Laterality: Right, Anterior Cleanser: Soap and Water 3 x Per Week/30 Days Discharge Instructions: May shower and wash wound with dial antibacterial soap and water prior to dressing change. Cleanser: Wound Cleanser (Generic) 3 x Per Week/30 Days Discharge Instructions: Cleanse the wound with wound cleanser prior to applying a clean dressing using gauze sponges, not tissue or cotton balls. Topical: Keystone 3 x Per Week/30 Days Prim Dressing: Hydrofera Blue Ready Transfer Foam, 4x5 (in/in) (Generic) 3 x Per Week/30 Days ary Discharge Instructions: Apply to wound bed as instructed Prim Dressing: Keystone topical compounding antibiotics 3 x Per Week/30 Days ary Discharge Instructions: applied directly to wound bed. Secondary Dressing: ABD Pad, 5x9 (Generic) 3 x Per Week/30 Days Discharge Instructions: Apply over primary dressing as directed. Secondary Dressing: Zetuvit Plus 4x8 in 3 x Per Week/30 Days Discharge Instructions: Apply over primary dressing as directed. Secured With: [employer] Size 5, 10 (yds) (Generic) 3 x Per Week/30 Days Com pression Wrap: Kerlix Roll 4.5x3.1 (in/yd) 3 x Per Week/30 Days Discharge Instructions: Apply Kerlix and Coban compression as directed. Com pression Wrap: Coban Self-Adherent Wrap 4x5 (in/yd) 3 x Per Week/30 Days Discharge Instructions: Apply over Kerlix as directed. WOUND #2: - Lower Leg Wound Laterality: Right, Medial Cleanser: Soap and Water 3 x Per Week/30 Days Discharge Instructions: May shower and wash  wound with dial antibacterial soap and water prior to dressing change. Cleanser: Wound Cleanser (Generic) 3 x Per Week/30 Days Discharge Instructions: Cleanse the wound with wound cleanser prior to applying a clean dressing using gauze sponges, not tissue or cotton balls. Prim Dressing: Keystone topical compounding antibiotics 3 x Per Week/30 Days ary Discharge Instructions: applied directly to wound bed. Prim Dressing: CONTINUE KEYSTONE TOPICAL ANTIBIOTICS AND HYDROFERA BLUE UNTIL THE WOUND VAC ARRIVES. ONCE ARRIVES APPLY ary WHITE AND BLACK FOAM TO THE MEDIAL LOWER LEG. 3 x Per Week/30 Days TIOFILO, SHOW ([id number]) [id number].pdf Page 7 of 8 Prim Dressing: wound vac 3 x Per Week/30 Days ary Secondary Dressing: ABD Pad, 5x9 (Generic) 3 x Per Week/30 Days Discharge Instructions: Apply over primary dressing as directed. Secondary Dressing: Woven Gauze Sponge, Non-Sterile 4x4 in 3 x Per Week/30 Days Discharge Instructions: APPLY OVER THE HYDROFERA BLUE TO ENSURE CONTACT WITH WOUND BED. Secondary Dressing: Zetuvit Plus 4x8 in 3 x Per Week/30 Days Discharge Instructions: Apply over primary dressing as directed. Secured With: [employer] Size 5, 10 (yds) (Generic) 3 x Per Week/30 Days Com pression Wrap: Kerlix Roll 4.5x3.1 (in/yd) 3 x Per Week/30 Days Discharge Instructions: Apply Kerlix and Coban compression as directed. Com pression Wrap: Coban Self-Adherent Wrap 4x5 (in/yd) 3 x Per Week/30 Days Discharge Instructions: Apply over Kerlix as directed. 1. Follow-up with vein and vascular today 2. Keystone antibiotic ointment with Hydrofera Blue under Tubigrip 3. Wound VAC to the proximal incision site 4. Follow-up in 1 week Electronic Signature(s) Signed: 02/23/2023 4:20:39 PM By: Deon Pilling RN, BSN Signed: 02/26/2023 10:54:33 AM By: Kalman Shan DO Previous Signature: 02/22/2023 12:51:55 PM Version By: Kalman Shan DO Entered By: Deon Pilling on  02/23/2023 [id number] -------------------------------------------------------------------------------- HxROS Details Patient Name: Date of Service: Vincent Eng R. 02/22/2023 9:30 A M Medical Record Number: [id number] Patient Account Number: [id number] Date of Birth/Sex: Treating RN: Apr 21, 1951 (72 y.o. M) Primary Care Provider: Maggie Font Other Clinician: Referring Provider: Treating Provider/Extender: Wilnette Kales in  Treatment: 6 Information Obtained From Patient Chart Cardiovascular Medical History: Positive for: Hypertension; Peripheral Arterial Disease; Peripheral Venous Disease Past Medical History Notes: Hyperlipidemia Gastrointestinal Medical History: Past Medical History Notes: GERD Oncologic Medical History: Past Medical History Notes: prostate ca Immunizations Pneumococcal Vaccine: Received Pneumococcal Vaccination: Yes Received Pneumococcal Vaccination On or After 60th Birthday: Yes Implantable Devices None Hospitalization / Surgery History Type of Hospitalization/Surgery femoral-tibial bypass graft right 12/12/22 radical prostatectomy 2011 tonsillectomy age 22 Family and Social History Unknown History: Yes; Former smoker; Marital Status - Single; Alcohol Use: Never; Drug Use: No History; Caffeine Use: Rarely; Financial Concerns: No; Food, Clothing or Shelter Needs: No; Support System Lacking: No; Transportation Concerns: No TATEUM, ELVIDGE ([id number]) [id number] Page 8 of 8 Electronic Signature(s) Signed: 02/22/2023 12:51:55 PM By: Kalman Shan DO Entered By: Kalman Shan on 02/22/2023 10:10:22 -------------------------------------------------------------------------------- SuperBill Details Patient Name: Date of Service: [name]. 02/22/2023 Medical Record Number: [id number] Patient Account Number: [id number] Date of Birth/Sex: Treating RN: 19-Jan-1951 (72 y.o.  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 I87.311 Chronic venous hypertension (idiopathic) with ulcer of right lower extremity T81.31XA Disruption of external operation (surgical) wound, not elsewhere classified, initial encounter Physician Procedures : CPT4 Code Description Modifier [id number] - WC PHYS LEVEL 3 - EST PT ICD-10 Diagnosis Description L97.815 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

## 2023-02-25 NOTE — Progress Notes (Signed)
ALFIO, ALLSUP ([id number].pdf Page 1 of 7 Visit Report for 02/22/2023 Arrival Information Details Patient Name: Date of Service: [name] 02/22/2023 9:30 A M Medical Record Number: [id number] Patient Account Number: [id number] Date of Birth/Sex: Treating RN: 23-Nov-1951 (72 y.o. Lorette Ang, Meta.Reding Primary Care Cristina Mattern: Maggie Font Other Clinician: Referring Christiann Hagerty: Treating Lacheryl Niesen/Extender: Wilnette Kales in Treatment: 6 Visit Information History Since Last Visit Added or deleted any medications: No Patient Arrived: Kasandra Knudsen Any new allergies or adverse reactions: No Arrival Time: 09:15 Had a fall or experienced change in No Accompanied By: self activities of daily living that may affect Transfer Assistance: None risk of falls: Patient Identification Verified: Yes Signs or symptoms of abuse/neglect since last visito No Secondary Verification Process Completed: Yes Hospitalized since last visit: No Patient Requires Transmission-Based Precautions: No Implantable device outside of the clinic excluding No Patient Has Alerts: No cellular tissue based products placed in the center since last visit: Has Dressing in Place as Prescribed: Yes Has Compression in Place as Prescribed: Yes Pain Present Now: Yes Electronic Signature(s) Signed: 02/22/2023 5:16:11 PM By: Deon Pilling RN, BSN Entered By: Deon Pilling on 02/22/2023 09:24:40 -------------------------------------------------------------------------------- Lower Extremity Assessment Details Patient Name: Date of Service: [name] 02/22/2023 9:30 A M Medical Record Number: [id number] Patient Account Number: [id number] Date of Birth/Sex: Treating RN: 1951-02-22 (72 y.o. Lorette Ang, Meta.Reding Primary Care Lexany Belknap: Maggie Font Other Clinician: Referring Francely Craw: Treating Chip Canepa/Extender: Wilnette Kales in  Treatment: 6 Edema Assessment Assessed: [Left: No] [Right: Yes] Edema: [Left: Ye] [Right: s] Calf Left: Right: Point of Measurement: 42 cm From Medial Instep 4.5 cm Ankle Left: Right: Point of Measurement: 9 cm From Medial Instep 25 cm Vascular Assessment Pulses: Dorsalis Pedis Palpable: [Right:No] Notes right foot cool to touch, cap refill >3sec, pale color toes. Innocence Schlotzhauer made aware. Electronic Signature(s) Signed: 02/22/2023 5:16:11 PM By: Deon Pilling RN, BSN Entered By: Deon Pilling on 02/22/2023 Eads, Reinholds ([id number].pdf Page 2 of 7 -------------------------------------------------------------------------------- Multi Wound Chart Details Patient Name: Date of Service: [name] 02/22/2023 9:30 A M Medical Record Number: [id number] Patient Account Number: [id number] Date of Birth/Sex: Treating RN: 03/18/1951 (72 y.o. M) Primary Care Andrian Sabala: Maggie Font Other Clinician: Referring Kendan Cornforth: Treating Gwendolyn Nishi/Extender: Wilnette Kales in Treatment: 6 Vital Signs Height(in): 71 Pulse(bpm): 107 Weight(lbs): 232 Blood Pressure(mmHg): 115/58 Body Mass Index(BMI): 32.4 Temperature(F): 99.4 Respiratory Rate(breaths/min): 17 [1:Photos:] [N/A:N/A] Right, Anterior Lower Leg Right, Medial Lower Leg N/A Wound Location: Trauma Surgical Injury N/A Wounding Event: Arterial Insufficiency Ulcer Dehisced Wound N/A Primary Etiology: Hypertension, Peripheral Arterial Hypertension, Peripheral Arterial N/A Comorbid History: Disease, Peripheral Venous Disease Disease, Peripheral Venous Disease 09/13/2022 01/03/2023 N/A Date Acquired: 6 6 N/A Weeks of Treatment: Open Open N/A Wound Status: No No N/A Wound Recurrence: 14x19x0.1 7x3x1 N/A Measurements L x W x D (cm) 208.916 16.493 N/A A (cm) : rea 20.892 16.493 N/A Volume (cm) : -13.70% -76.50% N/A % Reduction in Area: 43.20%  -341.20% N/A % Reduction in Volume: Full Thickness With Exposed Support Full Thickness With Exposed Support N/A Classification: Structures Structures Large Medium N/A Exudate Amount: Serosanguineous Serosanguineous N/A Exudate Type: red, brown red, brown N/A Exudate Color: Distinct, outline attached Distinct, outline attached N/A Wound Margin: Small (1-33%) Medium (34-66%) N/A Granulation Amount: Red, Pink Red, Pink N/A Granulation Quality: Large (67-100%) Medium (34-66%) N/A Necrotic Amount: Fat Layer (Subcutaneous  Tissue): Yes Fat Layer (Subcutaneous Tissue): Yes N/A Exposed Structures: Fascia: No Fascia: No Tendon: No Tendon: No Muscle: No Muscle: No Joint: No Joint: No Bone: No Bone: No None N/A N/A Epithelialization: Excoriation: No Scarring: Yes N/A Periwound Skin Texture: Induration: No Excoriation: No Callus: No Induration: No Crepitus: No Callus: No Rash: No Crepitus: No Scarring: No Rash: No Maceration: Yes Maceration: Yes N/A Periwound Skin Moisture: Dry/Scaly: No Dry/Scaly: No Ecchymosis: Yes Ecchymosis: Yes N/A Periwound Skin Color: Atrophie Blanche: No Atrophie Blanche: No Cyanosis: No Cyanosis: No Erythema: No Erythema: No Hemosiderin Staining: No Hemosiderin Staining: No Mottled: No Mottled: No Pallor: No Pallor: No Rubor: No Rubor: No Cool/Cold N/A N/A Temperature: Yes N/A N/A Tenderness on Palpation: Treatment Notes MAXIN, MAHAN ([id number] Page 3 of 7 Electronic Signature(s) Signed: 02/22/2023 12:51:55 PM By: Kalman Shan DO Entered By: Kalman Shan on 02/22/2023 10:09:16 -------------------------------------------------------------------------------- Multi-Disciplinary Care Plan Details Patient Name: Date of Service: [name] 02/22/2023 9:30 A M Medical Record Number: [id number] Patient Account Number: [id number] Date of Birth/Sex: Treating  RN: 1951/02/21 (72 y.o. [name], [name] Primary Care Nihaal Friesen: Maggie Font Other Clinician: Referring Havannah Streat: Treating Tahj Njoku/Extender: Wilnette Kales in Treatment: 6 Active Inactive Wound/Skin Impairment Nursing Diagnoses: Impaired tissue integrity Knowledge deficit related to ulceration/compromised skin integrity Goals: Patient will have a decrease in wound volume by X% from date: (specify in notes) Date Initiated: 01/11/2023 Target Resolution Date: 03/23/2023 Goal Status: Active Patient/caregiver will verbalize understanding of skin care regimen Date Initiated: 01/11/2023 Date Inactivated: 01/30/2023 Target Resolution Date: 02/03/2023 Goal Status: Met Ulcer/skin breakdown will have a volume reduction of 30% by week 4 Date Initiated: 01/11/2023 Target Resolution Date: 02/24/2023 Goal Status: Active Interventions: Assess patient/caregiver ability to obtain necessary supplies Assess patient/caregiver ability to perform ulcer/skin care regimen upon admission and as needed Assess ulceration(s) every visit Notes: Electronic Signature(s) Signed: 02/22/2023 4:19:23 PM By: Rhae Hammock RN Entered By: Rhae Hammock on 02/22/2023 09:45:42 -------------------------------------------------------------------------------- Pain Assessment Details Patient Name: Date of Service: [name] 02/22/2023 9:30 A M Medical Record Number: [id number] Patient Account Number: [id number] Date of Birth/Sex: Treating RN: 09-19-1951 (72 y.o. Hessie Diener Primary Care Roddy Bellamy: Maggie Font Other Clinician: Referring Stewart Sasaki: Treating Ricketta Colantonio/Extender: Wilnette Kales in Treatment: 6 Active Problems Location of Pain Severity and Description of Pain Patient Has Paino Yes Site Locations Pain LocationJAYLINN, CURBY ([id number]) [id number] Page 4 of 7 Pain Location: Generalized Pain, Pain in  Ulcers Rate the pain. Current Pain Level: 9 Character of Pain Describe the Pain: Burning, Heavy, Sharp, Stabbing Pain Management and Medication Current Pain Management: Medication: No Cold Application: No Rest: No Massage: No Activity: No T.E.N.S.: No Heat Application: No Leg drop or elevation: No Is the Current Pain Management Adequate: Adequate How does your wound impact your activities of daily livingo Sleep: No Bathing: No Appetite: No Relationship With Others: No Bladder Continence: No Emotions: No Bowel Continence: No Work: No Toileting: No Drive: No Dressing: No Hobbies: No Notes per patient pain burning in foot and leg. Electronic Signature(s) Signed: 02/22/2023 5:16:11 PM By: Deon Pilling RN, BSN Entered By: Deon Pilling on 02/22/2023 09:25:22 -------------------------------------------------------------------------------- Patient/Caregiver Education Details Patient Name: Date of Service: [name] 2/29/2024andnbsp9:30 A M Medical Record Number: [id number] Patient Account Number: [id number] Date of Birth/Gender: Treating RN: 03/27/1951 (72 y.o. Erie Noe Primary Care Physician: Maggie Font Other Clinician: Referring Physician: Treating  Physician/Extender: Wilnette Kales in Treatment: 6 Education Assessment Education Provided To: Patient Education Topics Provided Wound/Skin Impairment: Methods: Explain/Verbal Responses: Reinforcements needed, State content correctly Electronic Signature(s) Signed: 02/22/2023 4:19:23 PM By: Rhae Hammock RN Entered By: Rhae Hammock on 02/22/2023 09:45:52 [place], [place] ([id number] Page 5 of 7 -------------------------------------------------------------------------------- Wound Assessment Details Patient Name: Date of Service: [name] 02/22/2023 9:30 A M Medical Record Number: [id number] Patient  Account Number: [id number] Date of Birth/Sex: Treating RN: [birth year] (72 y.o. Lorette Ang, Meta.Reding Primary Care Chonita Gadea: Maggie Font Other Clinician: Referring Khadeejah Castner: Treating Lennart Gladish/Extender: Wilnette Kales in Treatment: 6 Wound Status Wound Number: 1 Primary Arterial Insufficiency Ulcer Etiology: Wound Location: Right, Anterior Lower Leg Wound Status: Open Wounding Event: Trauma Comorbid Hypertension, Peripheral Arterial Disease, Peripheral Venous Date Acquired: 09/13/2022 History: Disease Weeks Of Treatment: 6 Clustered Wound: No Photos Wound Measurements Length: (cm) 14 Width: (cm) 19 Depth: (cm) 0.1 Area: (cm) 208.916 Volume: (cm) 20.892 % Reduction in Area: -13.7% % Reduction in Volume: 43.2% Epithelialization: None Tunneling: No Undermining: No Wound Description Classification: Full Thickness With Exposed Support Structures Wound Margin: Distinct, outline attached Exudate Amount: Large Exudate Type: Serosanguineous Exudate Color: red, brown Foul Odor After Cleansing: No Slough/Fibrino Yes Wound Bed Granulation Amount: Small (1-33%) Exposed Structure Granulation Quality: Red, Pink Fascia Exposed: No Necrotic Amount: Large (67-100%) Fat Layer (Subcutaneous Tissue) Exposed: Yes Necrotic Quality: Adherent Slough Tendon Exposed: No Muscle Exposed: No Joint Exposed: No Bone Exposed: No Periwound Skin Texture Texture Color No Abnormalities Noted: No No Abnormalities Noted: No Callus: No Atrophie Blanche: No Crepitus: No Cyanosis: No Excoriation: No Ecchymosis: Yes Induration: No Erythema: No Rash: No Hemosiderin Staining: No Scarring: No Mottled: No Pallor: No Moisture Rubor: No No Abnormalities Noted: No Dry / Scaly: No Temperature / Pain Maceration: Yes Temperature: Cool/Cold Tenderness on Palpation: Yes Electronic Signature(s) WARDELL, HASBARGEN ([id number] Page 6 of  7 Signed: 02/22/2023 5:16:11 PM By: Deon Pilling RN, BSN Entered By: Deon Pilling on 02/22/2023 09:27:56 -------------------------------------------------------------------------------- Wound Assessment Details Patient Name: Date of Service: RO Fulton Reek R. 02/22/2023 9:30 A M Medical Record Number: [id number] Patient Account Number: [id number] Date of Birth/Sex: Treating RN: Aug 27, 1951 (72 y.o. Lorette Ang, Meta.Reding Primary Care Sharrod Achille: Maggie Font Other Clinician: Referring Kimiko Common: Treating Sha Amer/Extender: Wilnette Kales in Treatment: 6 Wound Status Wound Number: 2 Primary Dehisced Wound Etiology: Wound Location: Right, Medial Lower Leg Wound Status: Open Wounding Event: Surgical Injury Comorbid Hypertension, Peripheral Arterial Disease, Peripheral Venous Date Acquired: 01/03/2023 History: Disease Weeks Of Treatment: 6 Clustered Wound: No Photos Wound Measurements Length: (cm) 7 Width: (cm) 3 Depth: (cm) 1 Area: (cm) 16.493 Volume: (cm) 16.493 % Reduction in Area: -76.5% % Reduction in Volume: -341.2% Tunneling: No Undermining: No Wound Description Classification: Full Thickness With Exposed Support Structures Wound Margin: Distinct, outline attached Exudate Amount: Medium Exudate Type: Serosanguineous Exudate Color: red, brown Foul Odor After Cleansing: No Slough/Fibrino Yes Wound Bed Granulation Amount: Medium (34-66%) Exposed Structure Granulation Quality: Red, Pink Fascia Exposed: No Necrotic Amount: Medium (34-66%) Fat Layer (Subcutaneous Tissue) Exposed: Yes Necrotic Quality: Adherent Slough Tendon Exposed: No Muscle Exposed: No Joint Exposed: No Bone Exposed: No Periwound Skin Texture Texture Color No Abnormalities Noted: No No Abnormalities Noted: No Callus: No Atrophie Blanche: No Crepitus: No Cyanosis: No Excoriation: No Ecchymosis: Yes Induration: No Erythema: No Rash: No Hemosiderin Staining:  No Scarring: Yes Mottled: No Pallor: No Moisture  Rubor: No No Abnormalities Noted: No Dry / Scaly: No MacerationFAIZ, SHIM ([id number]) [id number] Page 7 of 7 Electronic Signature(s) Signed: 02/22/2023 5:16:11 PM By: Deon Pilling RN, BSN Entered By: Deon Pilling on 02/22/2023 09:28:48 -------------------------------------------------------------------------------- Vitals Details Patient Name: Date of Service: [name] 02/22/2023 9:30 A M Medical Record Number: [id number] Patient Account Number: [id number] Date of Birth/Sex: Treating RN: 1951-09-12 (72 y.o. Hessie Diener Primary Care Athaliah Baumbach: Maggie Font Other Clinician: Referring Shirlyn Savin: Treating Leaha Cuervo/Extender: Wilnette Kales in Treatment: 6 Vital Signs Time Taken: 09:28 Temperature (F): 99.4 Height (in): 71 Pulse (bpm): 107 Weight (lbs): 232 Respiratory Rate (breaths/min): 17 Body Mass Index (BMI): 32.4 Blood Pressure (mmHg): 115/58 Reference Range: 80 - 120 mg / dl Electronic Signature(s) Signed: 02/22/2023 5:16:11 PM By: Deon Pilling RN, BSN Entered By: Deon Pilling on 02/22/2023 09:30:53

## 2023-02-26 ENCOUNTER — Encounter (HOSPITAL_BASED_OUTPATIENT_CLINIC_OR_DEPARTMENT_OTHER): Payer: Medicare Other | Attending: Internal Medicine | Admitting: Internal Medicine

## 2023-02-26 DIAGNOSIS — I87311 Chronic venous hypertension (idiopathic) with ulcer of right lower extremity: Secondary | ICD-10-CM | POA: Diagnosis not present

## 2023-02-26 DIAGNOSIS — I70238 Atherosclerosis of native arteries of right leg with ulceration of other part of lower right leg: Secondary | ICD-10-CM | POA: Insufficient documentation

## 2023-02-26 DIAGNOSIS — L97815 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 without evidence of necrosis: Secondary | ICD-10-CM | POA: Diagnosis not present

## 2023-02-26 DIAGNOSIS — L97812 Non-pressure chronic ulcer of other part of right lower leg with fat layer exposed: Secondary | ICD-10-CM | POA: Diagnosis not present

## 2023-02-28 ENCOUNTER — Other Ambulatory Visit: Payer: Self-pay | Admitting: Podiatry

## 2023-02-28 ENCOUNTER — Ambulatory Visit: Payer: Medicare Other | Admitting: Vascular Surgery

## 2023-02-28 ENCOUNTER — Encounter: Payer: Self-pay | Admitting: Vascular Surgery

## 2023-02-28 ENCOUNTER — Other Ambulatory Visit: Payer: Self-pay

## 2023-02-28 VITALS — BP 116/60 | HR 126 | Temp 98.3°F | Resp 20 | Ht 72.0 in | Wt 239.0 lb

## 2023-02-28 DIAGNOSIS — I70221 Atherosclerosis of native arteries of extremities with rest pain, right leg: Secondary | ICD-10-CM

## 2023-02-28 NOTE — Progress Notes (Signed)
GERONE, GROSSHEIM ([id number]) [id number] Page 1 of 8 Visit Report for 02/26/2023 Chief Complaint Document Details Patient Name: Date of Service: [name] 02/26/2023 9:00 A M Medical Record Number: [id number] Patient Account Number: [id number] Date of Birth/Sex: Treating RN: [birth year] (72 y.o. M) Primary Care Provider: Maggie Font Other Clinician: Referring Provider: Treating Provider/Extender: Wilnette Kales in Treatment: 6 Information Obtained from: Patient Chief Complaint 01/11/2023; arterial right lower extremity wound, surgical dehiscence to the medial right lower extremity Electronic Signature(s) Signed: 02/26/2023 10:54:33 AM By: Kalman Shan DO Entered By: Kalman Shan on 02/26/2023 08:52:02 -------------------------------------------------------------------------------- HPI Details Patient Name: Date of Service: Vincent Eng R. 02/26/2023 9:00 A M Medical Record Number: [id number] Patient Account Number: [id number] Date of Birth/Sex: Treating RN: 27-Sep-1951 (72 y.o. M) Primary Care Provider: Maggie Font Other Clinician: Referring Provider: Treating Provider/Extender: Wilnette Kales in Treatment: 6 History of Present Illness HPI Description: 01/11/2023 [name] is a 73 year old male with a past medical history of peripheral arterial disease status post femoropopliteal bypass graft on 12/12/2022 and prostate cancer that presents to the clinic with 2 wounds to his right lower extremity. He states that in October 2023 he was wearing a boot that rubbed into his leg creating a wound. Since the wound was not healing he had ABIs completed that showed a right ABI of 0.35 and TBI of 0. He was referred [facility], Dr. Donnetta Hutching who did a femoropopliteal bypass graft on the right on 12/12/2022. He reports chronic pain to the wound site. He reports having staples removed to  the medial right leg 1 week ago and the wound site has dehisced. He has not been dressing either of the wound beds. He currently denies systemic signs of infection. 1/25; patient presents for follow-up. He followed up with vein and vascular yesterday. It was noted that he had excellent biphasic flow in the posterior tibial of the right lower extremity. He has been using Hydrofera Blue and Santyl to the wound beds. He has chronic pain to the wound sites. 2/1; patient presents for follow-up. Patient had a PCR culture done at last clinic visit that showed E. coli, coagulase-negative staph, Candida parapsilosis and actinotignum schaalii. He is currently on ciprofloxacin that covers the E. coli. I will switch him over to Augmentin. We ordered [facility] antibiotic ointment and patient has paid for this and states it is coming in the mail tomorrow. For now he has been using Vashe wet-to-dry dressings. 2/6; patient presents for follow-up. He obtained the [facility] antibiotic in the mail. He started this Saturday. He has had no issues with it. He also continues to take Augmentin. He has a few days left. He denies systemic signs of infection. 2/8; patient presents for follow-up. He had a wrap placed at last clinic visit and he tolerated this well. We have been using Keystone antibiotic ointment with Hydrofera Blue under Kerlix/Coban. We have ordered the wound VAC but have not heard back for insurance approval. He currently denies systemic signs of infection. 2/15; patient presents for follow-up. We have been using Hydrofera Blue and Keystone antibiotic ointment to the right lower extremity wounds under Kerlix/Coban. He has the wound VAC with him today. Unfortunately insurance is not approving home health. He will come in our clinic to have the [place] changed. 2/22; patient presents for follow-up. We have been using the wound VAC to the proximal right lower extremity wound and polymem and  Keystone  antibiotic ointment to the distal right lower extremity wound under Kerlix/Coban. He underwent arteriography on 11/20/2022 and subsequent right common femoral to posterior tibial bypass with composite Gore-T and vein on 12/12/2022. During his follow-up with [facility] yesterday it was noted that he has thrombosed his ex composite graft. There is little benefit to attempt the redo bypass or lysis of the thrombectomy. He was explained that he had a high risk for amputation. He currently denies systemic signs of infection. 2/29; patient presents for follow-up. We have been using PolyMem and Keystone with Santyl to the distal right lower extremity wound under Kerlix/Coban. We have been using the VAC to the proximal right extremity. Today states he is having more pain to the distal wound. His foot is cold today and no pulses heard on Doppler. His vein and vascular office was called and he was able to be scheduled today for 11:00 For further evaluation. 3/4; patient presents for follow-up. At last clinic visit he was describing more pain to the right lower extremity and the wound appeared worsened. He followed up with vein and vascular that day. They noted he has no Doppler signals in the right foot with minimal toe motor. He does not have further revascularization options. Since he is not ready for an amputation I recommended continuing wound care. We have been using Hydrofera Blue with Keystone antibiotic and wound VAC under Kerlix/Coban. Wounds do not appear healthy. Foot is cold. Patient has chronic pain. [name], [name] ([id number]) [id number] Page 2 of 8 Electronic Signature(s) Signed: 02/26/2023 10:54:33 AM By: Kalman Shan DO Entered By: Kalman Shan on 02/26/2023 09:22:12 -------------------------------------------------------------------------------- Physical Exam Details Patient Name: Date of Service: Vincent Eng R. 02/26/2023 9:00 A M Medical Record  Number: [id number] Patient Account Number: [id number] Date of Birth/Sex: Treating RN: 1951-10-07 (72 y.o. M) Primary Care Provider: Maggie Font Other Clinician: Referring Provider: Treating Provider/Extender: Wilnette Kales in Treatment: 6 Constitutional respirations regular, non-labored and within target range for patient.. Cardiovascular 2+ dorsalis pedis/posterior tibialis pulses. Psychiatric pleasant and cooperative. Notes Right lower extremity: T the anterior distal aspect there is an open wound with nonviable surface. Skin breakdown to the surrounding periwound. T the medial o o right leg there is an incision site with a large dehisced wound with non viable tissue . no purulent drainage. 2+ pitting edema to the knee. No surrounding signs of soft tissue infection to any of the wound beds. Foot and leg are cold. Electronic Signature(s) Signed: 02/26/2023 10:54:33 AM By: Kalman Shan DO Entered By: Kalman Shan on 02/26/2023 09:23:56 -------------------------------------------------------------------------------- Physician Orders Details Patient Name: Date of Service: Vincent Eng R. 02/26/2023 9:00 A M Medical Record Number: [id number] Patient Account Number: [id number] Date of Birth/Sex: Treating RN: 1951-05-24 (72 y.o. 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 - ***EXTRA TIME BLOCK EXTRA  ROOM D/T WOUND VAC AND EXTENSIVE WOUND CARE*** Return A w/ Dr. Cheron Every Rm # 9 Thursday 03/01/23 @ 9:30 ppointment in 2 weeks. - ***EXTRA TIME BLOCK EXTRA ROOM D/T WOUND VAC AND EXTENSIVE WOUND CARE*** Return A w/ Dr. Heber [place] and Allayne Butcher Rm # 9 Other: - Go to Vein and Vascular at 11:00 today for appointment!!! Bring in topical antibiotics to each appt time. Bring wound vac supplies every appt. Anesthetic (In clinic) Topical Lidocaine 5% applied to wound bed Cellular or Tissue Based Products Wound #1 Right,Anterior Lower Leg [name], [name] ([id number]) [id number] Page 3 of 8 Cellular or Tissue Based Product Type: - 02/01/2023 [employer] run insurance for approval. Bathing/ Shower/ Hygiene May shower with protection but do not get wound dressing(s) wet. Protect dressing(s) with water repellant cover (for example, large plastic bag) or a cast cover and may then take shower. - May wash wounds with dial antibacterial soap in the shower if you immediately dress wound after shower. Negative Presssure Wound Therapy Wound #2 Right,Medial Lower Leg Wound Vac to wound continuously at 165m/hg pressure - change twice a week at wound care center unless home health approved by insurance. Nurse visits every Monday, wound care encounters every Thursday Black Foam Other: - apply alignate Ag to lower portion of wound where maceration and weeping are present. Apply a small double layer duoderm inferior to wound to aid with the wound vac seal. Edema Control - Lymphedema / SCD / Other Elevate legs to the level of the heart or above for 30 minutes daily and/or when sitting for 3-4 times a day throughout the day. Avoid standing for long periods of time. Additional Orders /  Instructions Follow Nutritious Diet - increase protein intake with meals, drink boost or Glucerna, and use Juven. Wound Treatment Wound #1 - Lower Leg Wound Laterality: Right, Anterior Cleanser: Soap and Water 3 x Per Week/30 Days Discharge Instructions: May shower and wash wound with dial antibacterial soap and water prior to dressing change. Cleanser: Wound Cleanser (Generic) 3 x Per Week/30 Days Discharge Instructions: Cleanse the wound with wound cleanser prior to applying a clean dressing using gauze sponges, not tissue or cotton balls. Topical: Keystone 3 x Per Week/30 Days Prim Dressing: Hydrofera Blue Ready Transfer Foam, 4x5 (in/in) (Generic) 3 x Per Week/30 Days ary Discharge Instructions: Apply to wound bed as instructed Prim Dressing: Maxorb Extra Ag+ Alginate Dressing, 4x4.75 (in/in) 3 x Per Week/30 Days ary Discharge Instructions: Apply to to weeping, macerated open areas. Prim Dressing: Keystone topical compounding antibiotics 3 x Per Week/30 Days ary Discharge Instructions: applied directly to wound bed. Secondary Dressing: ABD Pad, 5x9 (Generic) 3 x Per Week/30 Days Discharge Instructions: Apply over primary dressing as directed. Secondary Dressing: Zetuvit Plus 4x8 in 3 x Per Week/30 Days Discharge Instructions: Apply over primary dressing as directed. Compression Wrap: Kerlix Roll 4.5x3.1 (in/yd) 3 x Per Week/30 Days Discharge Instructions: Apply Kerlix and Coban compression as directed. Compression Wrap: tubigrip size E 3 x Per Week/30 Days Discharge Instructions: apply over the kerlix. Wound #2 - Lower Leg Wound Laterality: Right, Medial Cleanser: Soap and Water 3 x Per Week/30 Days Discharge Instructions: May shower and wash wound with dial antibacterial soap and water prior to dressing change. Cleanser: Wound Cleanser (Generic) 3 x Per Week/30 Days Discharge Instructions: Cleanse the wound with wound cleanser prior to applying a clean dressing using gauze sponges,  not tissue or cotton balls. Prim Dressing: Keystone topical compounding antibiotics 3 x Per Week/30 Days ary Discharge  Instructions: applied directly to wound bed. Prim Dressing: CONTINUE KEYSTONE TOPICAL ANTIBIOTICS AND HYDROFERA BLUE UNTIL THE WOUND VAC ARRIVES. ONCE ARRIVES APPLY ary WHITE AND BLACK FOAM TO THE MEDIAL LOWER LEG. 3 x Per Week/30 Days Prim Dressing: wound vac ary 3 x Per Week/30 Days Secondary Dressing: ABD Pad, 5x9 (Generic) 3 x Per Week/30 Days Discharge Instructions: Apply over primary dressing as directed. Secondary Dressing: Woven Gauze Sponge, Non-Sterile 4x4 in 3 x Per Week/30 Days Discharge Instructions: APPLY OVER THE HYDROFERA BLUE TO ENSURE CONTACT WITH WOUND BED. Secondary Dressing: Zetuvit Plus 4x8 in 3 x Per Week/30 Days Discharge Instructions: Apply over primary dressing as directed. [name], [name] ([id number]) [id number] Page 4 of 8 Compression Wrap: Kerlix Roll 4.5x3.1 (in/yd) 3 x Per Week/30 Days Discharge Instructions: Apply Kerlix and Coban compression as directed. Compression Wrap: tubigrip size E 3 x Per Week/30 Days Discharge Instructions: apply over the kerlix. Electronic Signature(s) Signed: 02/26/2023 3:19:32 PM By: Kalman Shan DO Signed: 02/26/2023 5:32:25 PM By: Deon Pilling RN, BSN Previous Signature: 02/26/2023 10:54:33 AM Version By: Kalman Shan DO Entered By: Deon Pilling on 02/26/2023 12:49:35 -------------------------------------------------------------------------------- Problem List Details Patient Name: Date of Service: Vincent Eng R. 02/26/2023 9:00 A M Medical Record Number: [id number] Patient Account Number: [id number] Date of Birth/Sex: Treating RN: 1951/01/17 (72 y.o. M) Primary Care Provider: Maggie Font Other Clinician: Referring Provider: Treating Provider/Extender: Wilnette Kales in Treatment: 6 Active Problems ICD-10 Encounter Code  Description Active Date MDM Diagnosis L97.815 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 02/01/2023 No Yes involvement without evidence of necrosis L97.812 Non-pressure chronic ulcer of other part of right lower leg with fat layer 02/01/2023 No Yes exposed I70.238 Atherosclerosis of native arteries of right leg with ulceration of other part of 01/11/2023 No Yes lower leg I87.311 Chronic venous hypertension (idiopathic) with ulcer of right lower extremity 02/01/2023 No Yes T81.31XA Disruption of external operation (surgical) wound, not elsewhere classified, 01/11/2023 No Yes initial encounter Inactive Problems Resolved Problems Electronic Signature(s) Signed: 02/26/2023 10:54:33 AM By: Kalman Shan DO Entered By: Kalman Shan on 02/26/2023 08:51:50 -------------------------------------------------------------------------------- Progress Note Details Patient Name: Date of Service: Vincent Fulton Reek R. 02/26/2023 9:00 A M Medical Record Number: [id number] Patient Account Number: [id number] Date of Birth/Sex: Treating RN: March 25, 1951 (72 y.o. M) Primary Care Provider: Maggie Font Other Clinician: YAHMIR, [name] ([id number]) [id number] Page 5 of 8 Referring Provider: Treating Provider/Extender: Wilnette Kales in Treatment: 6 Subjective Chief Complaint Information obtained from Patient 01/11/2023; arterial right lower extremity wound, surgical dehiscence to the medial right lower extremity History of Present Illness (HPI) 01/11/2023 Mr. Huntter Oguinn is a 72 year old male with a past medical history of peripheral arterial disease status post femoropopliteal bypass graft on 12/12/2022 and prostate cancer that presents to the clinic with 2 wounds to his right lower extremity. He states that in October 2023 he was wearing a boot that rubbed into his leg creating a wound. Since the wound was not healing  he had ABIs completed that showed a right ABI of 0.35 and TBI of 0. He was referred to [facility], Dr. Donnetta Hutching who did a femoropopliteal bypass graft on the right on 12/12/2022. He reports chronic pain to the wound site. He reports having staples removed to the medial right leg 1 week ago and the wound site has dehisced. He has not been dressing either of the wound beds. He currently denies  systemic signs of infection. 1/25; patient presents for follow-up. He followed up with vein and vascular yesterday. It was noted that he had excellent biphasic flow in the posterior tibial of the right lower extremity. He has been using Hydrofera Blue and Santyl to the wound beds. He has chronic pain to the wound sites. 2/1; patient presents for follow-up. Patient had a PCR culture done at last clinic visit that showed E. coli, coagulase-negative staph, Candida parapsilosis and actinotignum schaalii. He is currently on ciprofloxacin that covers the E. coli. I will switch him over to Augmentin. We ordered [facility] antibiotic ointment and patient has paid for this and states it is coming in the mail tomorrow. For now he has been using Vashe wet-to-dry dressings. 2/6; patient presents for follow-up. He obtained the [facility] antibiotic in the mail. He started this Saturday. He has had no issues with it. He also continues to take Augmentin. He has a few days left. He denies systemic signs of infection. 2/8; patient presents for follow-up. He had a wrap placed at last clinic visit and he tolerated this well. We have been using Keystone antibiotic ointment with Hydrofera Blue under Kerlix/Coban. We have ordered the wound VAC but have not heard back for insurance approval. He currently denies systemic signs of infection. 2/15; patient presents for follow-up. We have been using Hydrofera Blue and Keystone antibiotic ointment to the right lower extremity wounds under Kerlix/Coban. He has the wound VAC with him today. Unfortunately  insurance is not approving home health. He will come in our clinic to have the [place] changed. 2/22; patient presents for follow-up. We have been using the wound VAC to the proximal right lower extremity wound and polymem and Keystone antibiotic ointment to the distal right lower extremity wound under Kerlix/Coban. He underwent arteriography on 11/20/2022 and subsequent right common femoral to posterior tibial bypass with composite Gore-T and vein on 12/12/2022. During his follow-up with [facility] yesterday it was noted that he has thrombosed his ex composite graft. There is little benefit to attempt the redo bypass or lysis of the thrombectomy. He was explained that he had a high risk for amputation. He currently denies systemic signs of infection. 2/29; patient presents for follow-up. We have been using PolyMem and Keystone with Santyl to the distal right lower extremity wound under Kerlix/Coban. We have been using the VAC to the proximal right extremity. Today states he is having more pain to the distal wound. His foot is cold today and no pulses heard on Doppler. His vein and vascular office was called and he was able to be scheduled today for 11:00 For further evaluation. 3/4; patient presents for follow-up. At last clinic visit he was describing more pain to the right lower extremity and the wound appeared worsened. He followed up with vein and vascular that day. They noted he has no Doppler signals in the right foot with minimal toe motor. He does not have further revascularization options. Since he is not ready for an amputation I recommended continuing wound care. We have been using Hydrofera Blue with Keystone antibiotic and wound VAC under Kerlix/Coban. Wounds do not appear healthy. Foot is cold. Patient has chronic pain. Patient History Information obtained from Patient, Chart. Family History Unknown History. Social History Former smoker, Marital Status - Single, Alcohol Use - Never, Drug Use  - No History, Caffeine Use - Rarely. Medical History Cardiovascular Patient has history of Hypertension, Peripheral Arterial Disease, Peripheral Venous Disease Hospitalization/Surgery History - femoral-tibial bypass graft right 12/12/22. -  radical prostatectomy 2011. - tonsillectomy age 76. Medical A Surgical History Notes nd Cardiovascular Hyperlipidemia Gastrointestinal GERD Oncologic prostate ca Objective Constitutional respirations regular, non-labored and within target range for patient.[name], [name] ([id number]) [id number] Page 6 of 8 Vitals Time Taken: 8:51 AM, Height: 71 in, Weight: 232 lbs, BMI: 32.4, Temperature: 99.8 F, Pulse: 116 bpm, Respiratory Rate: 24 breaths/min, Blood Pressure: 117/64 mmHg. Cardiovascular 2+ dorsalis pedis/posterior tibialis pulses. Psychiatric pleasant and cooperative. General Notes: Right lower extremity: T the anterior distal aspect there is an open wound with nonviable surface. Skin breakdown to the surrounding periwound. o T the medial right leg there is an incision site with a large dehisced wound with non viable tissue . no purulent drainage. 2+ pitting edema to the knee. No o surrounding signs of soft tissue infection to any of the wound beds. Foot and leg are cold. Integumentary (Hair, Skin) Wound #1 status is Open. Original cause of wound was Trauma. The date acquired was: 09/13/2022. The wound has been in treatment 6 weeks. The wound is located on the Right,Anterior Lower Leg. The wound measures 15cm length x 18cm width x 0.1cm depth; 212.058cm^2 area and 21.206cm^3 volume. There is Fat Layer (Subcutaneous Tissue) exposed. There is no tunneling or undermining noted. There is a large amount of serosanguineous drainage noted. The wound margin is distinct with the outline attached to the wound base. There is medium (34-66%) red, pink granulation within the wound bed. There is a medium (34-66%) amount of  necrotic tissue within the wound bed including Adherent Slough. The periwound skin appearance exhibited: Maceration, Ecchymosis. The periwound skin appearance did not exhibit: Callus, Crepitus, Excoriation, Induration, Rash, Scarring, Dry/Scaly, Atrophie Blanche, Cyanosis, Hemosiderin Staining, Mottled, Pallor, Rubor, Erythema. Periwound temperature was noted as Cool/Cold. The periwound has tenderness on palpation. Wound #2 status is Open. Original cause of wound was Surgical Injury. The date acquired was: 01/03/2023. The wound has been in treatment 6 weeks. The wound is located on the Right,Medial Lower Leg. The wound measures 8cm length x 3cm width x 1cm depth; 18.85cm^2 area and 18.85cm^3 volume. There is Fat Layer (Subcutaneous Tissue) exposed. There is no tunneling or undermining noted. There is a medium amount of serosanguineous drainage noted. The wound margin is distinct with the outline attached to the wound base. There is medium (34-66%) red granulation within the wound bed. There is a medium (34- 66%) amount of necrotic tissue within the wound bed including Adherent Slough. The periwound skin appearance exhibited: Scarring, Maceration, Ecchymosis. The periwound skin appearance did not exhibit: Callus, Crepitus, Excoriation, Induration, Rash, Dry/Scaly, Atrophie Blanche, Cyanosis, Hemosiderin Staining, Mottled, Pallor, Rubor, Erythema. Periwound temperature was noted as Cool/Cold. 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 I recommended he follow-up with vein and vascular at last clinic visit as I was concerned about no detectable pedal pulses, increased pain and cold right lower extremity. Per [facility] continue wound care. He does have follow-up with Dr. Donnetta Hutching this week (which was his regular scheduled appt). For now we will continue with current regimen of  Hydrofera Blue, Keystone antibiotic ointment and wound VAC. Will use Tubigrip to help with swelling. Patient will likely need amputation. Plan Follow-up Appointments: Return Appointment in 1 week. - ***EXTRA TIME BLOCK EXTRA ROOM D/T WOUND VAC AND EXTENSIVE WOUND CARE*** w/ Dr. Cheron Every Rm # 9 Thursday 03/01/23 @ 9:30 Return Appointment in 2 weeks. - ***EXTRA TIME BLOCK EXTRA ROOM D/T WOUND VAC AND EXTENSIVE WOUND CARE*** w/ Dr. Heber [place] and Allayne Butcher Rm # 9 Other: - Go to Vein and Vascular at 11:00 today for appointment!!! Bring in topical antibiotics to each appt time. Bring wound vac supplies every appt. Anesthetic: (In clinic) Topical Lidocaine 5% applied to wound bed Cellular or Tissue Based Products: Wound #1 Right,Anterior Lower Leg: Cellular or Tissue Based Product Type: - 02/01/2023 [employer] run insurance for approval. Bathing/ Shower/ Hygiene: May shower with protection but do not get wound dressing(s) wet. Protect dressing(s) with water repellant cover (for example, large plastic bag) or a cast cover and may then take shower. - May wash wounds with dial antibacterial soap in the shower if you immediately dress wound after shower. Negative Presssure Wound Therapy: Wound #2 Right,Medial Lower Leg: Wound Vac to wound continuously at 17m/hg pressure - change twice a week at wound care center unless home health approved by insurance. Nurse visits every Monday, wound care encounters every Thursday Black Foam Other: - apply alignate Ag to lower portion of wound where  maceration and weeping are present. Apply a small double layer duoderm inferior to wound to aid with the wound vac seal. Edema Control - Lymphedema / SCD / Other: Elevate legs to the level of the heart or above for 30 minutes daily and/or when sitting for 3-4 times a day throughout the day. Avoid standing for long periods of time. Additional Orders / Instructions: Follow Nutritious Diet - increase protein intake with meals, drink boost or Glucerna, and use Juven. WOUND #1: - Lower Leg Wound Laterality: Right, Anterior Cleanser: Soap and Water 3 x Per Week/30 Days Discharge Instructions: May shower and wash wound with dial antibacterial soap and water prior to dressing change. Cleanser: Wound Cleanser (Generic) 3 x Per Week/30 Days RJAXDEN, DENOVA([id number] Page 7 of 8 Discharge Instructions: Cleanse the wound with wound cleanser prior to applying a clean dressing using gauze sponges, not tissue or cotton balls. Topical: Keystone 3 x Per Week/30 Days Prim Dressing: Hydrofera Blue Ready Transfer Foam, 4x5 (in/in) (Generic) 3 x Per Week/30 Days ary Discharge Instructions: Apply to wound bed as instructed Prim Dressing: Maxorb Extra Ag+ Alginate Dressing, 4x4.75 (in/in) 3 x Per Week/30 Days ary Discharge Instructions: Apply to to weeping, macerated open areas. Prim Dressing: Keystone topical compounding antibiotics 3 x Per Week/30 Days ary Discharge Instructions: applied directly to wound bed. Secondary Dressing: ABD Pad, 5x9 (Generic) 3 x Per Week/30 Days Discharge Instructions: Apply over primary dressing as directed. Secondary Dressing: Zetuvit Plus 4x8 in 3 x Per Week/30 Days Discharge Instructions: Apply over primary dressing as directed. Com pression Wrap: Kerlix Roll 4.5x3.1 (in/yd) 3 x Per Week/30 Days Discharge Instructions: Apply Kerlix and Coban compression as directed. Com pression Wrap: tubigrip size E 3 x Per Week/30  Days Discharge Instructions: apply over the kerlix. WOUND #2: - Lower Leg Wound Laterality: Right, Medial Cleanser: Soap and Water 3 x Per Week/30 Days Discharge Instructions: May shower and wash wound with dial antibacterial soap and water prior to dressing change. Cleanser: Wound Cleanser (Generic) 3 x Per Week/30 Days Discharge Instructions: Cleanse the wound  with wound cleanser prior to applying a clean dressing using gauze sponges, not tissue or cotton balls. Prim Dressing: Keystone topical compounding antibiotics 3 x Per Week/30 Days ary Discharge Instructions: applied directly to wound bed. Prim Dressing: CONTINUE KEYSTONE TOPICAL ANTIBIOTICS AND HYDROFERA BLUE UNTIL THE WOUND VAC ARRIVES. ONCE ARRIVES APPLY ary WHITE AND BLACK FOAM TO THE MEDIAL LOWER LEG. 3 x Per Week/30 Days Prim Dressing: wound vac 3 x Per Week/30 Days ary Secondary Dressing: ABD Pad, 5x9 (Generic) 3 x Per Week/30 Days Discharge Instructions: Apply over primary dressing as directed. Secondary Dressing: Woven Gauze Sponge, Non-Sterile 4x4 in 3 x Per Week/30 Days Discharge Instructions: APPLY OVER THE HYDROFERA BLUE TO ENSURE CONTACT WITH WOUND BED. Secondary Dressing: Zetuvit Plus 4x8 in 3 x Per Week/30 Days Discharge Instructions: Apply over primary dressing as directed. Com pression Wrap: Kerlix Roll 4.5x3.1 (in/yd) 3 x Per Week/30 Days Discharge Instructions: Apply Kerlix and Coban compression as directed. Com pression Wrap: tubigrip size E 3 x Per Week/30 Days Discharge Instructions: apply over the kerlix. 1. Hydrofera Blue 2. Keystone antibiotic ointment 3. Tubigrip 4. Wound VAC 5. Follow-up in 1 week 6. Follow-up later this week for nurse visit Electronic Signature(s) Signed: 02/26/2023 3:19:32 PM By: Kalman Shan DO Signed: 02/26/2023 5:32:25 PM By: Deon Pilling RN, BSN Previous Signature: 02/26/2023 10:54:33 AM Version By: Kalman Shan DO Entered By: Deon Pilling on 02/26/2023  12:50:27 -------------------------------------------------------------------------------- HxROS Details Patient Name: Date of Service: Vincent Eng R. 02/26/2023 9:00 A M Medical Record Number: [id number] Patient Account Number: [id number] Date of Birth/Sex: Treating RN: Jun 29, 1951 (72 y.o. M) Primary Care Provider: Maggie Font Other Clinician: Referring Provider: Treating Provider/Extender: Wilnette Kales in Treatment: 6 Information Obtained From Patient Chart Cardiovascular Medical History: Positive for: Hypertension; Peripheral Arterial Disease; Peripheral Venous Disease Past Medical History Notes: Hyperlipidemia Gastrointestinal Medical History: Past Medical History Notes: GERD Oncologic Medical HistoryMarland Kitchen LAVONE, LAUTURE ([id number]) [id number] Page 8 of 8 Past Medical History Notes: prostate ca Immunizations Pneumococcal Vaccine: Received Pneumococcal Vaccination: Yes Received Pneumococcal Vaccination On or After 60th Birthday: Yes Implantable Devices None Hospitalization / Surgery History Type of Hospitalization/Surgery femoral-tibial bypass graft right 12/12/22 radical prostatectomy 2011 tonsillectomy age 84 Family and Social History Unknown History: Yes; Former smoker; Marital Status - Single; Alcohol Use: Never; Drug Use: No History; Caffeine Use: Rarely; Financial Concerns: No; Food, Clothing or Shelter Needs: No; Support System Lacking: No; Transportation Concerns: No Electronic Signature(s) Signed: 02/26/2023 10:54:33 AM By: Kalman Shan DO Entered By: Kalman Shan on 02/26/2023 09:22:17 -------------------------------------------------------------------------------- SuperBill Details Patient Name: Date of Service: [address] 02/26/2023 Medical Record Number: [id number] Patient Account Number: [id number] Date of Birth/Sex: Treating RN: Jun 17, 1951 (72 y.o. 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 without evidence of necrosis L97.812 Non-pressure chronic ulcer of other part of right lower leg with fat layer exposed I70.238 Atherosclerosis of native arteries of right leg with ulceration of other part of lower leg I87.311 Chronic venous hypertension (idiopathic) with ulcer of right lower extremity T81.31XA Disruption of external operation (surgical) wound, not elsewhere classified, initial encounter Facility Procedures : CPT4 Code: [id number] Description: [id number] - WOUND VAC-50 SQ CM OR LESS Modifier: Quantity: 1 Physician Procedures : CPT4 Code Description Modifier [id number] - WC  PHYS LEVEL 3 - EST PT ICD-10 Diagnosis Description L97.815 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

## 2023-02-28 NOTE — H&P (View-Only) (Signed)
 Patient ID: [name], male   DOB: 10/04/1951, 72 y.o.   MRN: [id number]  Reason for Consult: Follow-up   Referred by Hill, Gerald, MD  Subjective:     HPI:  [name] is a 72 y.o. male underwent a right common femoral endarterectomy with common femoral to posterior tibial bypass with composite PTFE and vein this was in December of last year.  3 weeks ago he was found to have a repeat ABI of 0 with a preoperative ABI of 0.35.  He is now being seen at the wound center for extensive wounds of the right leg.  He is quite emotional about the prospect of losing the right lower extremity.  Currently walking with a walker.  He is taking aspirin and statin.  He has significant pain in the right leg although the swelling from postoperative has improved.  Past Medical History:  Diagnosis Date   GERD (gastroesophageal reflux disease)    History of kidney stones    Hyperlipidemia    Hypertension    Prostate cancer (HCC) 2011   radiation june 2020   Umbilical hernia    Wears dentures    full   Wears glasses    Wears partial dentures    lower   Family History  Problem Relation Age of Onset   Gastric cancer Mother    Prostate cancer Father    Breast cancer Neg Hx    Pancreatic cancer Neg Hx    Past Surgical History:  Procedure Laterality Date   ABDOMINAL AORTOGRAM W/LOWER EXTREMITY N/A 11/20/2022   Procedure: ABDOMINAL AORTOGRAM W/LOWER EXTREMITY;  Surgeon: Tabathia Knoche Christopher, MD;  Location: [facility];  Service: Cardiovascular;  Laterality: N/A;   COLONOSCOPY N/A 08/25/2020   Procedure: COLONOSCOPY;  Surgeon: Rourk, Robert M, MD;  Location: [facility];  Service: Endoscopy;  Laterality: N/A;  9:30   COLONOSCOPY     CYSTOSCOPY WITH LITHOLAPAXY N/A 05/18/2021   Procedure: CYSTOSCOPY WITH LITHOLAPAXY;  Surgeon: Winter, Christopher Aaron, MD;  Location: [facility];  Service: Urology;  Laterality: N/A;  ONLY NEEDS  30 MIN   FEMORAL-TIBIAL  BYPASS GRAFT Right 12/12/2022   Procedure: RIGHT COMMON FEMORAL-POSTERIOR TIBIAL ARTERY BYPASS WITH VEIN HARVESTING OF THE GREATER SAPHENOUS VEIN AND FEMORAL ENDARTERECTOMY WITH COMPOSITE GRAFT;  Surgeon: Rasheka Denard Christopher, MD;  Location: [facility];  Service: Vascular;  Laterality: Right;   POLYPECTOMY  08/25/2020   Procedure: POLYPECTOMY;  Surgeon: Rourk, Robert M, MD;  Location: [facility];  Service: Endoscopy;;   ROBOT ASSISTED LAPAROSCOPIC RADICAL PROSTATECTOMY  2011   TONSILLECTOMY  age 20   and adenoids removed    Short Social History:  Social History   Tobacco Use   Smoking status: Former    Packs/day: 0.25    Years: 21.00    Total pack years: 5.25    Types: Cigarettes    Quit date: 11/29/2022    Years since quitting: 0.2   Smokeless tobacco: Never  Substance Use Topics   Alcohol use: Never    Allergies  Allergen Reactions   Shellfish Allergy Swelling    Current Outpatient Medications  Medication Sig Dispense Refill   aspirin EC 81 MG tablet Take 81 mg by mouth daily.     atorvastatin (LIPITOR) 40 MG tablet Take 1 tablet (40 mg total) by mouth every morning. 30 tablet 3   esomeprazole (NEXIUM) 20 MG capsule Take 20 mg by mouth daily.     furosemide (LASIX) 20   MG tablet Take 20 mg by mouth daily.     gabapentin (NEURONTIN) 100 MG capsule Take 1 capsule (100 mg total) by mouth 3 (three) times daily. (Patient taking differently: Take 100 mg by mouth at bedtime as needed (Nerve pain).) 90 capsule 3   lisinopril-hydrochlorothiazide (PRINZIDE,ZESTORETIC) 10-12.5 MG tablet Take 1 tablet by mouth every other day.      meloxicam (MOBIC) 15 MG tablet Take 1 tablet by mouth once daily 30 tablet 0   metoprolol tartrate (LOPRESSOR) 25 MG tablet Take 1/2 (one-half) tablet by mouth twice daily 30 tablet 0   Multiple Vitamins-Minerals (CENTRUM SILVER PO) Take 1 tablet by mouth daily.      oxybutynin (DITROPAN-XL) 10 MG 24 hr tablet Take 10 mg by mouth daily.      oxyCODONE-acetaminophen (PERCOCET/ROXICET) 5-325 MG tablet Take 1 tablet by mouth every 6 (six) hours as needed for moderate pain. 20 tablet 0   potassium chloride SA (KLOR-CON M) 20 MEQ tablet Take 20 mEq by mouth daily.     SPIKEVAX syringe      Current Facility-Administered Medications  Medication Dose Route Frequency Provider Last Rate Last Admin   betamethasone acetate-betamethasone sodium phosphate (CELESTONE) injection 3 mg  3 mg Intra-articular Once Evans, Brent M, DPM       betamethasone acetate-betamethasone sodium phosphate (CELESTONE) injection 3 mg  3 mg Intra-articular Once Evans, Brent M, DPM        Review of Systems  Constitutional:  Constitutional negative. HENT: HENT negative.  Eyes: Eyes negative.  Cardiovascular: Positive for leg swelling.  GI: Gastrointestinal negative.  Musculoskeletal: Positive for leg pain.  Skin: Positive for wound.  Neurological: Neurological negative. Hematologic: Hematologic/lymphatic negative.  Psychiatric: Psychiatric negative.        Objective:  Objective   Vitals:   02/28/23 0857  BP: 116/60  Pulse: (!) 126  Resp: 20  Temp: 98.3 F (36.8 C)  SpO2: 96%  Weight: 239 lb (108.4 kg)  Height: 6' (1.829 m)   Body mass index is 32.41 kg/m.  Physical Exam HENT:     Head: Normocephalic.     Mouth/Throat:     Mouth: Mucous membranes are moist.  Eyes:     Pupils: Pupils are equal, round, and reactive to light.  Pulmonary:     Effort: Pulmonary effort is normal.  Abdominal:     General: Abdomen is flat.  Musculoskeletal:     Right lower leg: No edema.     Comments: Compression stocking wound VAC on right lower extremity  Neurological:     Mental Status: He is alert.  Psychiatric:     Comments: Emotional     Data: ABI Findings:  +---------+------------------+-----+-------------------+-------------------  ----+  Right   Rt Pressure (mmHg)IndexWaveform           Comment                    +---------+------------------+-----+-------------------+-------------------  ----+  Brachial 113                                                                 +---------+------------------+-----+-------------------+-------------------  ----+  PTA                              dampened monophasicUnna boot and  wound vac  +---------+------------------+-----+-------------------+-------------------  ----+  Great Toe0                 0.00 Absent                                       +---------+------------------+-----+-------------------+-------------------  ----+   +---------+------------------+-----+----------+-------+  Left    Lt Pressure (mmHg)IndexWaveform  Comment  +---------+------------------+-----+----------+-------+  Brachial 114                                       +---------+------------------+-----+----------+-------+  PTA     70                0.61 monophasic         +---------+------------------+-----+----------+-------+  DP      93                0.82 biphasic           +---------+------------------+-----+----------+-------+  Great Toe40                0.35                    +---------+------------------+-----+----------+-------+   +-------+------------+-----------+------------+------------+  ABI/TBIToday's ABI Today's TBIPrevious ABIPrevious TBI  +-------+------------+-----------+------------+------------+  Right Not acquired0          0.35        0             +-------+------------+-----------+------------+------------+  Left  0.82        0.35       0.83        0.48          +-------+------------+-----------+------------+------------+        Assessment/Plan:    72-year-old male with chronic right lower extremity limb threatening ischemia with preoperative ABI that was 0.35 and subsequent underwent angiography and ultimately bypass which has unfortunately failed and his last toe pressure on the  right was 0.  He continues with the wound care center.  I discussed with the patient his options being above-knee amputation on the right versus aerobic attempt at revascularization with endovascular means which might be possible given that now he has undergone common femoral endarterectomy although he may require retrograde revascularization of his anterior tibial or posterior tibial arteries.  I discussed this with his family present and even with revascularization he will remain high risk for above-knee amputation.  Patient appears to be overcome with emotion during today's evaluation but ultimately has agreed for right lower extremity angiography and attempt at revascularization knowing that there is still high risk for amputation.  If we are unable to revascularize we will admit him that night to consider amputation in the next day.  All questions were answered and I encouraged him to bring further questions with him to the date of the procedure.     Jadrian Bulman Christopher Taurus Alamo MD [facility]   

## 2023-02-28 NOTE — Progress Notes (Signed)
[name] ([id number]) [id number] Page 1 of 8 Visit Report for 02/26/2023 Arrival Information Details Patient Name: Date of Service: [name] 02/26/2023 9:00 A M Medical Record Number: [id number] Patient Account Number: [id number] Date of Birth/Sex: Treating RN: July 27, 1951 (72 y.o. [name], Meta.Reding Primary Care Nocole Zammit: Maggie Font Other Clinician: Referring Michai Dieppa: Treating Lonell Stamos/Extender: Wilnette Kales in Treatment: 6 Visit Information History Since Last Visit All ordered tests and consults were completed: Yes Patient Arrived: [name] Added or deleted any medications: No Arrival Time: 08:49 Any new allergies or adverse reactions: No Accompanied By: self Had a fall or experienced change in No Transfer Assistance: None activities of daily living that may affect Patient Identification Verified: Yes risk of falls: Secondary Verification Process Completed: Yes Signs or symptoms of abuse/neglect since last visito No Patient Requires Transmission-Based Precautions: No Hospitalized since last visit: No Patient Has Alerts: No Implantable device outside of the clinic excluding No cellular tissue based products placed in the center since last visit: Has Dressing in Place as Prescribed: Yes Has Compression in Place as Prescribed: Yes Pain Present Now: Yes Electronic Signature(s) Signed: 02/26/2023 5:32:25 PM By: Deon Pilling RN, BSN Entered By: Deon Pilling on 02/26/2023 08:50:17 -------------------------------------------------------------------------------- Encounter Discharge Information Details Patient Name: Date of Service: [name]. 02/26/2023 9:00 A M Medical Record Number: [id number] Patient Account Number: [id number] Date of Birth/Sex: Treating RN: 1951-03-15 (72 y.o. [name] Primary Care Herby Amick: Maggie Font Other Clinician: Referring Jonanthony Nahar: Treating  Jeral Zick/Extender: Wilnette Kales in Treatment: 6 Encounter Discharge Information Items Discharge Condition: Stable Ambulatory Status: Walker Discharge Destination: Home Transportation: Private Auto Accompanied By: self Schedule Follow-up Appointment: Yes Clinical Summary of Care: Electronic Signature(s) Signed: 02/26/2023 5:32:25 PM By: Deon Pilling RN, BSN Entered By: Deon Pilling on 02/26/2023 12:51:11 -------------------------------------------------------------------------------- Lower Extremity Assessment Details Patient Name: Date of Service: [name]. 02/26/2023 9:00 A M Medical Record Number: [id number] Patient Account Number: [id number] Date of Birth/Sex: Treating RN: March 28, 1951 (72 y.o. [name] Primary Care Amellia Panik: Maggie Font Other Clinician: Referring Michael Ventresca: Treating Jerome Viglione/Extender: Wilnette Kales in Treatment: 6 Edema Assessment Left: [Left: Right] [Right: :] Assessed: [Left: No] [Right: Yes] Edema: [Left: Ye] [Right: s] Calf Left: Right: Point of Measurement: 42 cm From Medial Instep 43 cm Ankle Left: Right: Point of Measurement: 9 cm From Medial Instep 28 cm Vascular Assessment Pulses: Dorsalis Pedis Palpable: [Right:No] Electronic Signature(s) Signed: 02/26/2023 5:32:25 PM By: Deon Pilling RN, BSN Entered By: Deon Pilling on 02/26/2023 08:56:34 -------------------------------------------------------------------------------- Multi Wound Chart Details Patient Name: Date of Service: [name]. 02/26/2023 9:00 A M Medical Record Number: [id number] Patient Account Number: [id number] Date of Birth/Sex: Treating RN: 09-12-1951 (72 y.o. M) Primary Care Reiss Mowrey: Maggie Font Other Clinician: Referring Muad Noga: Treating Jackie Littlejohn/Extender: Wilnette Kales in Treatment: 6 Vital Signs Height(in): 71 Pulse(bpm): 116 Weight(lbs): 232 Blood  Pressure(mmHg): 117/64 Body Mass Index(BMI): 32.4 Temperature(F): 99.8 Respiratory Rate(breaths/min): 24 [Treatment Notes:Wound Assessments Treatment Notes] Electronic Signature(s) Signed: 02/26/2023 10:54:33 AM By: Kalman Shan DO Entered By: Kalman Shan on 02/26/2023 08:51:54 -------------------------------------------------------------------------------- Multi-Disciplinary Care Plan Details Patient Name: Date of Service: Vincent [address] Lewis. 02/26/2023 9:00 A M Medical Record Number: [id number] Patient Account Number: [id number] Date of Birth/Sex: Treating RN: [birth year] (72 y.o. [name] Primary Care Shown Dissinger: Maggie Font Other Clinician: Referring Norbert Malkin: Treating  Paislynn Hegstrom/Extender: Patrecia Pour Weeks in Treatment: 6 Active Inactive Wound/Skin Impairment Nursing Diagnoses: Impaired tissue integrity Knowledge deficit related to ulceration/compromised skin integrity Goals: YEICO, WITTSTRUCK ([id number]) [id number] Page 3 of 8 Patient will have a decrease in wound volume by X% from date: (specify in notes) Date Initiated: 01/11/2023 Target Resolution Date: 04/27/2023 Goal Status: Active Patient/caregiver will verbalize understanding of skin care regimen Date Initiated: 01/11/2023 Date Inactivated: 01/30/2023 Target Resolution Date: 02/03/2023 Goal Status: Met Ulcer/skin breakdown will have a volume reduction of 30% by week 4 Date Initiated: 01/11/2023 Date Inactivated: 02/26/2023 Target Resolution Date: 02/24/2023 Unmet Reason: patient arterial status is Goal Status: Unmet poor. Wounds are worsening. Interventions: Assess patient/caregiver ability to obtain necessary supplies Assess patient/caregiver ability to perform ulcer/skin care regimen upon admission and as needed Assess ulceration(s) every visit Notes: Electronic Signature(s) Signed: 02/26/2023 5:32:25 PM By: Deon Pilling RN, BSN Entered By: Deon Pilling on 02/26/2023 09:08:26 -------------------------------------------------------------------------------- Negative Pressure Wound Therapy Maintenance (NPWT) Details Patient Name: Date of Service: Vincent Limber. 02/26/2023 9:00 A M Medical Record Number: [id number] Patient Account Number: [id number] Date of Birth/Sex: Treating RN: 08-18-1951 (72 y.o. [name] Primary Care Hephzibah Strehle: Maggie Font Other Clinician: Referring Tiawanna Luchsinger: Treating Gerad Cornelio/Extender: Wilnette Kales in Treatment: 6 NPWT Maintenance Performed for: Wound #2 Right, Medial Lower Leg Additional Injuries Covered: No Performed By: Deon Pilling, RN Type: VAC System Coverage Size (sq cm): 24 Pressure Type: Constant Pressure Setting: 125 mmHG Drain Type: None Primary Contact: Non-Adherent Sponge/Dressing Type: Foam- Black Date Initiated: 02/08/2023 Dressing Removed: Yes Quantity of Sponges/Gauze Removed: 1 Canister Changed: No Dressing Reapplied: Yes Quantity of Sponges/Gauze Inserted: x1 black foam Days On NPWT : 19 Post Procedure Diagnosis Same as Pre-procedure Electronic Signature(s) Signed: 02/26/2023 5:32:25 PM By: Deon Pilling RN, BSN Entered By: Deon Pilling on 02/26/2023 09:10:04 -------------------------------------------------------------------------------- Pain Assessment Details Patient Name: Date of Service: [name]. 02/26/2023 9:00 A M Medical Record Number: [id number] Patient Account Number: [id number] Date of Birth/Sex: Treating RN: 1951/03/20 (72 y.o. [name] Primary Care Tyhesha Dutson: Maggie Font Other Clinician: Referring Jiana Lemaire: Treating Emannuel Vise/Extender: Wilnette Kales in Treatment: 6 Active Problems Location of Pain Severity and Description of Pain Patient Has [name], [name] ([id number]) [id number] Page 4 of 8 Patient Has Paino Yes Site Locations Pain  Location: Pain in Ulcers Rate the pain. Current Pain Level: 10 Character of Pain Describe the Pain: Burning Pain Management and Medication Current Pain Management: Medication: No Cold Application: No Rest: No Massage: No Activity: No T.E.N.S.: No Heat Application: No Leg drop or elevation: No Is the Current Pain Management Adequate: Adequate How does your wound impact your activities of daily livingo Sleep: No Bathing: No Appetite: No Relationship With Others: No Bladder Continence: No Emotions: No Bowel Continence: No Work: No Toileting: No Drive: No Dressing: No Hobbies: No Engineer, maintenance) Signed: 02/26/2023 5:32:25 PM By: Deon Pilling RN, BSN Entered By: Deon Pilling on 02/26/2023 08:51:32 -------------------------------------------------------------------------------- Patient/Caregiver Education Details Patient Name: Date of Service: [name] 3/4/2024andnbsp9:00 A M Medical Record Number: [id number] Patient Account Number: [id number] Date of Birth/Gender: Treating RN: Dec 17, 1951 (72 y.o. [name] Primary Care Physician: Maggie Font Other Clinician: Referring Physician: Treating Physician/Extender: Wilnette Kales in Treatment: 6 Education Assessment Education Provided To: Patient Education Topics Provided Tissue Oxygenation: Handouts: Peripheral Arterial Disease and Related Ulcers Methods: Explain/Verbal Responses:  Reinforcements needed Wound/Skin Impairment: Handouts: Caring for Your Ulcer Methods: Explain/Verbal Responses: Reinforcements needed [name], [name] ([id number]) [id number] Page 5 of 8 Electronic Signature(s) Signed: 02/26/2023 5:32:25 PM By: Deon Pilling RN, BSN Entered By: Deon Pilling on 02/26/2023 09:08:46 -------------------------------------------------------------------------------- Wound Assessment Details Patient Name: Date of Service: [name]. 02/26/2023 9:00 A M Medical Record Number: [id number] Patient Account Number: [id number] Date of Birth/Sex: Treating RN: [birth year] (72 y.o. [name], Meta.Reding Primary Care Isabellarose Kope: Maggie Font Other Clinician: Referring Geo Slone: Treating Lenell Lama/Extender: Wilnette Kales in Treatment: 6 Wound Status Wound Number: 1 Primary Arterial Insufficiency Ulcer Etiology: Wound Location: Right, Anterior Lower Leg Wound Status: Open Wounding Event: Trauma Comorbid Hypertension, Peripheral Arterial Disease, Peripheral Venous Date Acquired: 09/13/2022 History: Disease Weeks Of Treatment: 6 Clustered Wound: No Photos Wound Measurements Length: (cm) 15 Width: (cm) 18 Depth: (cm) 0.1 Area: (cm) 212.058 Volume: (cm) 21.206 % Reduction in Area: -15.4% % Reduction in Volume: 42.3% Epithelialization: None Tunneling: No Undermining: No Wound Description Classification: Full Thickness With Exposed Support Structures Wound Margin: Distinct, outline attached Exudate Amount: Large Exudate Type: Serosanguineous Exudate Color: red, brown Foul Odor After Cleansing: No Slough/Fibrino Yes Wound Bed Granulation Amount: Medium (34-66%) Exposed Structure Granulation Quality: Red, Pink Fascia Exposed: No Necrotic Amount: Medium (34-66%) Fat Layer (Subcutaneous Tissue) Exposed: Yes Necrotic Quality: Adherent Slough Tendon Exposed: No Muscle Exposed: No Joint Exposed: No Bone Exposed: No Periwound Skin Texture Texture Color No Abnormalities Noted: No No Abnormalities Noted: No Callus: No Atrophie Blanche: No Crepitus: No Cyanosis: No Excoriation: No Ecchymosis: Yes Induration: No Erythema: No Rash: No Hemosiderin Staining: No Scarring: No Mottled: No Pallor: No Moisture Rubor: No No Abnormalities Noted: No Dry / Scaly: No Temperature / Pain [name], [name] ([id number]) [id number] Page 6 of 8 Maceration:  Yes Temperature: Cool/Cold Tenderness on Palpation: Yes Treatment Notes Wound #1 (Lower Leg) Wound Laterality: Right, Anterior Cleanser Soap and Water Discharge Instruction: May shower and wash wound with dial antibacterial soap and water prior to dressing change. Wound Cleanser Discharge Instruction: Cleanse the wound with wound cleanser prior to applying a clean dressing using gauze sponges, not tissue or cotton balls. Peri-Wound Care Topical Keystone Primary Dressing Hydrofera Blue Ready Transfer Foam, 4x5 (in/in) Discharge Instruction: Apply to wound bed as instructed Maxorb Extra Ag+ Alginate Dressing, 4x4.75 (in/in) Discharge Instruction: Apply to to weeping, macerated open areas. Keystone topical compounding antibiotics Discharge Instruction: applied directly to wound bed. Secondary Dressing ABD Pad, 5x9 Discharge Instruction: Apply over primary dressing as directed. Zetuvit Plus 4x8 in Discharge Instruction: Apply over primary dressing as directed. Secured With Compression Wrap Kerlix Roll 4.5x3.1 (in/yd) Discharge Instruction: Apply Kerlix and Coban compression as directed. tubigrip size E Discharge Instruction: apply over the kerlix. Compression Stockings Add-Ons Electronic Signature(s) Signed: 02/26/2023 5:32:25 PM By: Deon Pilling RN, BSN Entered By: Deon Pilling on 02/26/2023 09:01:17 -------------------------------------------------------------------------------- Wound Assessment Details Patient Name: Date of Service: [name]. 02/26/2023 9:00 A M Medical Record Number: [id number] Patient Account Number: [id number] Date of Birth/Sex: Treating RN: 1951-03-02 (73 y.o. [name] Primary Care Amery Vandenbos: Maggie Font Other Clinician: Referring Emie Sommerfeld: Treating Velmer Broadfoot/Extender: Wilnette Kales in Treatment: 6 Wound Status Wound Number: 2 Primary Dehisced Wound Etiology: Wound Location: Right, Medial Lower  Leg Wound Status: Open Wounding Event: Surgical Injury Comorbid Hypertension, Peripheral Arterial Disease, Peripheral Venous Date Acquired: 01/03/2023 History: Disease Weeks Of Treatment: 6 Clustered Wound: No Photos [name], Vincent  Lewis ([id number]) [id number] Page 7 of 8 Wound Measurements Length: (cm) 8 Width: (cm) 3 Depth: (cm) 1 Area: (cm) 18.85 Volume: (cm) 18.85 % Reduction in Area: -101.7% % Reduction in Volume: -404.3% Epithelialization: None Tunneling: No Undermining: No Wound Description Classification: Full Thickness With Exposed Support Structures Wound Margin: Distinct, outline attached Exudate Amount: Medium Exudate Type: Serosanguineous Exudate Color: red, brown Foul Odor After Cleansing: No Slough/Fibrino Yes Wound Bed Granulation Amount: Medium (34-66%) Exposed Structure Granulation Quality: Red Fascia Exposed: No Necrotic Amount: Medium (34-66%) Fat Layer (Subcutaneous Tissue) Exposed: Yes Necrotic Quality: Adherent Slough Tendon Exposed: No Muscle Exposed: No Joint Exposed: No Bone Exposed: No Periwound Skin Texture Texture Color No Abnormalities Noted: No No Abnormalities Noted: No Callus: No Atrophie Blanche: No Crepitus: No Cyanosis: No Excoriation: No Ecchymosis: Yes Induration: No Erythema: No Rash: No Hemosiderin Staining: No Scarring: Yes Mottled: No Pallor: No Moisture Rubor: No No Abnormalities Noted: No Dry / Scaly: No Temperature / Pain Maceration: Yes Temperature: Cool/Cold Treatment Notes Wound #2 (Lower Leg) Wound Laterality: Right, Medial Cleanser Soap and Water Discharge Instruction: May shower and wash wound with dial antibacterial soap and water prior to dressing change. Wound Cleanser Discharge Instruction: Cleanse the wound with wound cleanser prior to applying a clean dressing using gauze sponges, not tissue or cotton balls. Peri-Wound Care Topical Primary Dressing Keystone  topical compounding antibiotics Discharge Instruction: applied directly to wound bed. CONTINUE KEYSTONE TOPICAL ANTIBIOTICS AND HYDROFERA BLUE UNTIL THE WOUND VAC ARRIVES. ONCE ARRIVES APPLY WHITE AND BLACK FOAM TO THE MEDIAL LOWER LEG. wound vac Secondary Dressing ABD Pad, 5x9 Discharge Instruction: Apply over primary dressing as directed. [name], [name] ([id number]) [id number] Page 8 of 8 Woven Gauze Sponge, Non-Sterile 4x4 in Discharge Instruction: [place] TO ENSURE CONTACT WITH WOUND BED. Zetuvit Plus 4x8 in Discharge Instruction: Apply over primary dressing as directed. Secured With Compression Wrap Kerlix Roll 4.5x3.1 (in/yd) Discharge Instruction: Apply Kerlix and Coban compression as directed. tubigrip size E Discharge Instruction: apply over the kerlix. Compression Stockings Add-Ons Electronic Signature(s) Signed: 02/26/2023 5:32:25 PM By: Deon Pilling RN, BSN Entered By: Deon Pilling on 02/26/2023 09:01:36 -------------------------------------------------------------------------------- Vitals Details Patient Name: Date of Service: [name]. 02/26/2023 9:00 A M Medical Record Number: [id number] Patient Account Number: [id number] Date of Birth/Sex: Treating RN: 03/28/1951 (72 y.o. [name] Primary Care Jahsiah Carpenter: Maggie Font Other Clinician: Referring Nonnie Pickney: Treating Lance Huaracha/Extender: Wilnette Kales in Treatment: 6 Vital Signs Time Taken: 08:51 Temperature (F): 99.8 Height (in): 71 Pulse (bpm): 116 Weight (lbs): 232 Respiratory Rate (breaths/min): 24 Body Mass Index (BMI): 32.4 Blood Pressure (mmHg): 117/64 Reference Range: 80 - 120 mg / dl Electronic Signature(s) Signed: 02/26/2023 5:32:25 PM By: Deon Pilling RN, BSN Entered By: Deon Pilling on 02/26/2023 08:51:16

## 2023-02-28 NOTE — Progress Notes (Signed)
Patient ID: [name], male   DOB: Dec 30, 1950, 72 y.o.   MRN: [id number]  Reason for Consult: Follow-up   Referred by Iona Beard, MD  Subjective:     HPI:  [name] is a 72 y.o. male underwent a right common femoral endarterectomy with common femoral to posterior tibial bypass with composite PTFE and vein this was in December of last year.  3 weeks ago he was found to have a repeat ABI of 0 with a preoperative ABI of 0.35.  He is now being seen at the wound center for extensive wounds of the right leg.  He is quite emotional about the prospect of losing the right lower extremity.  Currently walking with a walker.  He is taking aspirin and statin.  He has significant pain in the right leg although the swelling from postoperative has improved.  Past Medical History:  Diagnosis Date   GERD (gastroesophageal reflux disease)    History of kidney stones    Hyperlipidemia    Hypertension    Prostate cancer [facility]) 2011   radiation june 2020   Umbilical hernia    Wears dentures    full   Wears glasses    Wears partial dentures    lower   Family History  Problem Relation Age of Onset   Gastric cancer Mother    Prostate cancer Father    Breast cancer Neg Hx    Pancreatic cancer Neg Hx    Past Surgical History:  Procedure Laterality Date   ABDOMINAL AORTOGRAM W/LOWER EXTREMITY N/A 11/20/2022   Procedure: ABDOMINAL AORTOGRAM W/LOWER EXTREMITY;  Surgeon: Waynetta Sandy, MD;  Location: [place] [facility];  Service: Cardiovascular;  Laterality: N/A;   COLONOSCOPY N/A 08/25/2020   Procedure: COLONOSCOPY;  Surgeon: Daneil Dolin, MD;  [facility];  Service: Endoscopy;  Laterality: N/A;  9:30   COLONOSCOPY     CYSTOSCOPY WITH LITHOLAPAXY N/A 05/18/2021   Procedure: CYSTOSCOPY WITH LITHOLAPAXY;  Surgeon: Ceasar Mons, MD;  Location: [facility];  Service: Urology;  Laterality: N/A;  ONLY NEEDS  30 MIN   FEMORAL-TIBIAL  BYPASS GRAFT Right 12/12/2022   Procedure: RIGHT COMMON FEMORAL-POSTERIOR TIBIAL ARTERY BYPASS WITH VEIN HARVESTING OF THE GREATER SAPHENOUS VEIN AND FEMORAL ENDARTERECTOMY WITH COMPOSITE GRAFT;  Surgeon: Waynetta Sandy, MD;  Location: [facility];  Service: Vascular;  Laterality: Right;   POLYPECTOMY  08/25/2020   Procedure: POLYPECTOMY;  Surgeon: Daneil Dolin, MD;  Location: [facility];  Service: Endoscopy;;   ROBOT ASSISTED LAPAROSCOPIC RADICAL PROSTATECTOMY  2011   TONSILLECTOMY  age 9   and adenoids removed    Short Social History:  Social History   Tobacco Use   Smoking status: Former    Packs/day: 0.25    Years: 21.00    Total pack years: 5.25    Types: Cigarettes    Quit date: 11/29/2022    Years since quitting: 0.2   Smokeless tobacco: Never  Substance Use Topics   Alcohol use: Never    Allergies  Allergen Reactions   Shellfish Allergy Swelling    Current Outpatient Medications  Medication Sig Dispense Refill   aspirin EC 81 MG tablet Take 81 mg by mouth daily.     atorvastatin (LIPITOR) 40 MG tablet Take 1 tablet (40 mg total) by mouth every morning. 30 tablet 3   esomeprazole (NEXIUM) 20 MG capsule Take 20 mg by mouth daily.     furosemide (LASIX) 20  MG tablet Take 20 mg by mouth daily.     gabapentin (NEURONTIN) 100 MG capsule Take 1 capsule (100 mg total) by mouth 3 (three) times daily. (Patient taking differently: Take 100 mg by mouth at bedtime as needed (Nerve pain).) 90 capsule 3   lisinopril-hydrochlorothiazide (PRINZIDE,ZESTORETIC) 10-12.5 MG tablet Take 1 tablet by mouth every other day.      meloxicam (MOBIC) 15 MG tablet Take 1 tablet by mouth once daily 30 tablet 0   metoprolol tartrate (LOPRESSOR) 25 MG tablet Take 1/2 (one-half) tablet by mouth twice daily 30 tablet 0   Multiple Vitamins-Minerals (CENTRUM SILVER PO) Take 1 tablet by mouth daily.      oxybutynin (DITROPAN-XL) 10 MG 24 hr tablet Take 10 mg by mouth daily.      oxyCODONE-acetaminophen (PERCOCET/ROXICET) 5-325 MG tablet Take 1 tablet by mouth every 6 (six) hours as needed for moderate pain. 20 tablet 0   potassium chloride SA (KLOR-CON M) 20 MEQ tablet Take 20 mEq by mouth daily.     SPIKEVAX syringe      Current Facility-Administered Medications  Medication Dose Route Frequency Provider Last Rate Last Admin   betamethasone acetate-betamethasone sodium phosphate (CELESTONE) injection 3 mg  3 mg Intra-articular Once Daylene Katayama M, DPM       betamethasone acetate-betamethasone sodium phosphate (CELESTONE) injection 3 mg  3 mg Intra-articular Once Edrick Kins, DPM        Review of Systems  Constitutional:  Constitutional negative. HENT: HENT negative.  Eyes: Eyes negative.  Cardiovascular: Positive for leg swelling.  GI: Gastrointestinal negative.  Musculoskeletal: Positive for leg pain.  Skin: Positive for wound.  Neurological: Neurological negative. Hematologic: Hematologic/lymphatic negative.  Psychiatric: Psychiatric negative.        Objective:  Objective   Vitals:   02/28/23 0857  BP: 116/60  Pulse: (!) 126  Resp: 20  Temp: 98.3 F (36.8 C)  SpO2: 96%  Weight: 239 lb (108.4 kg)  Height: 6' (1.829 m)   Body mass index is 32.41 kg/m.  Physical Exam HENT:     Head: Normocephalic.     Mouth/Throat:     Mouth: Mucous membranes are moist.  Eyes:     Pupils: Pupils are equal, round, and reactive to light.  Pulmonary:     Effort: Pulmonary effort is normal.  Abdominal:     General: Abdomen is flat.  Musculoskeletal:     Right lower leg: No edema.     Comments: Compression stocking wound VAC on right lower extremity  Neurological:     Mental Status: He is alert.  Psychiatric:     Comments: Emotional     Data: ABI Findings:  +---------+------------------+-----+-------------------+-------------------  ----+  Right   Rt Pressure (mmHg)IndexWaveform           Comment                    +---------+------------------+-----+-------------------+-------------------  ----+  Brachial 113                                                                 +---------+------------------+-----+-------------------+-------------------  ----+  PTA  dampened monophasicUnna boot and  wound vac  +---------+------------------+-----+-------------------+-------------------  ----+  Great Toe0                 0.00 Absent                                       +---------+------------------+-----+-------------------+-------------------  ----+   +---------+------------------+-----+----------+-------+  Left    Lt Pressure (mmHg)IndexWaveform  Comment  +---------+------------------+-----+----------+-------+  Brachial 114                                       +---------+------------------+-----+----------+-------+  PTA     70                0.61 monophasic         +---------+------------------+-----+----------+-------+  DP      93                0.82 biphasic           +---------+------------------+-----+----------+-------+  Great Toe40                0.35                    +---------+------------------+-----+----------+-------+   +-------+------------+-----------+------------+------------+  ABI/TBIToday's ABI Today's TBIPrevious ABIPrevious TBI  +-------+------------+-----------+------------+------------+  Right Not acquired0          0.35        0             +-------+------------+-----------+------------+------------+  Left  0.82        0.35       0.83        0.48          +-------+------------+-----------+------------+------------+        Assessment/Plan:    72 year old male with chronic right lower extremity limb threatening ischemia with preoperative ABI that was 0.35 and subsequent underwent angiography and ultimately bypass which has unfortunately failed and his last toe pressure on the  right was 0.  He continues with the wound care center.  I discussed with the patient his options being above-knee amputation on the right versus aerobic attempt at revascularization with endovascular means which might be possible given that now he has undergone common femoral endarterectomy although he may require retrograde revascularization of his anterior tibial or posterior tibial arteries.  I discussed this with his family present and even with revascularization he will remain high risk for above-knee amputation.  Patient appears to be overcome with emotion during today's evaluation but ultimately has agreed for right lower extremity angiography and attempt at revascularization knowing that there is still high risk for amputation.  If we are unable to revascularize we will admit him that night to consider amputation in the next day.  All questions were answered and I encouraged him to bring further questions with him to the date of the procedure.     Waynetta Sandy MD [facility]

## 2023-03-01 ENCOUNTER — Encounter (HOSPITAL_BASED_OUTPATIENT_CLINIC_OR_DEPARTMENT_OTHER): Payer: Medicare Other | Admitting: Internal Medicine

## 2023-03-01 DIAGNOSIS — I70238 Atherosclerosis of native arteries of right leg with ulceration of other part of lower right leg: Secondary | ICD-10-CM | POA: Diagnosis not present

## 2023-03-01 DIAGNOSIS — I87311 Chronic venous hypertension (idiopathic) with ulcer of right lower extremity: Secondary | ICD-10-CM | POA: Diagnosis not present

## 2023-03-01 DIAGNOSIS — L97812 Non-pressure chronic ulcer of other part of right lower leg with fat layer exposed: Secondary | ICD-10-CM | POA: Diagnosis not present

## 2023-03-01 DIAGNOSIS — L97815 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 without evidence of necrosis: Secondary | ICD-10-CM | POA: Diagnosis not present

## 2023-03-03 NOTE — Progress Notes (Addendum)
ANTOINNE, Lewis ([id number]) [id number] Page 1 of 4 Visit Report for 03/01/2023 Arrival Information Details Patient Name: Date of Service: [name] 03/01/2023 9:30 A M Medical Record Number: [id number] Patient Account Number: [id number] Date of Birth/Sex: Treating RN: 03-10-1951 (72 y.o. M) Primary Care Jarmal Lewelling: Maggie Font Other Clinician: Referring Shelbi Vaccaro: Treating Deeanna Beightol/Extender: Wilnette Kales in Treatment: 7 Visit Information History Since Last Visit Added or deleted any medications: No Patient Arrived: Walker Any new allergies or adverse reactions: No Arrival Time: 10:19 Had a fall or experienced change in No Accompanied By: self activities of daily living that may affect Transfer Assistance: None risk of falls: Patient Identification Verified: Yes Signs or symptoms of abuse/neglect since last visito No Secondary Verification Process Completed: Yes Hospitalized since last visit: No Patient Requires Transmission-Based Precautions: No Implantable device outside of the clinic excluding No Patient Has Alerts: No cellular tissue based products placed in the center since last visit: Has Dressing in Place as Prescribed: Yes Pain Present Now: Yes Electronic Signature(s) Signed: 03/01/2023 3:53:55 PM By: Erenest Blank Entered By: Erenest Blank on 03/01/2023 10:19:51 -------------------------------------------------------------------------------- Encounter Discharge Information Details Patient Name: Date of Service: [name] 03/01/2023 9:30 A M Medical Record Number: [id number] Patient Account Number: [id number] Date of Birth/Sex: Treating RN: [birth year] (72 y.o. M) Primary Care Kayli Beal: Maggie Font Other Clinician: Erenest Blank Referring Trell Secrist: Treating Donyea Beverlin/Extender: Wilnette Kales in Treatment: 7 Encounter Discharge Information Items Discharge  Condition: Stable Ambulatory Status: Walker Discharge Destination: Home Transportation: Private Auto Accompanied By: friend Schedule Follow-up Appointment: Yes Clinical Summary of Care: Electronic Signature(s) Signed: 03/01/2023 3:53:55 PM By: Erenest Blank Entered By: Erenest Blank on 03/01/2023 11:37:51 -------------------------------------------------------------------------------- Multi-Disciplinary Care Plan Details Patient Name: Date of Service: Vincent Eng Lewis. 03/01/2023 9:30 A M Medical Record Number: [id number] Patient Account Number: [id number] Date of Birth/Sex: Treating RN: 31-Mar-1951 (72 y.o. Hessie Diener Primary Care Sabastion Hrdlicka: Maggie Font Other Clinician: Referring Dayanne Yiu: Treating Jaikob Borgwardt/Extender: Wilnette Kales in Treatment: [address] [name], [name] ([id number]) [id number] Page 2 of 4 Electronic Signature(s) Signed: 03/16/2023 12:27:18 PM By: Deon Pilling RN, BSN Entered By: Deon Pilling on 03/16/2023 12:27:18 -------------------------------------------------------------------------------- Negative Pressure Wound Therapy Maintenance (NPWT) Details Patient Name: Date of Service: [name] 03/01/2023 9:30 A M Medical Record Number: [id number] Patient Account Number: [id number] Date of Birth/Sex: Treating RN: [birth year] (72 y.o. M) Primary Care Lorra Freeman: Maggie Font Other Clinician: Referring Rosa Gambale: Treating Nafisa Olds/Extender: Wilnette Kales in Treatment: 7 NPWT Maintenance Performed for: Wound #2 Right, Medial Lower Leg Additional Injuries Covered: No Performed By: Erenest Blank, Type: VAC System Coverage Size (sq cm): 24 Pressure Type: Constant Pressure Setting: 125 mmHG Drain Type: None Primary Contact: Non-Adherent Sponge/Dressing Type: Foam- Black Date Initiated: 02/08/2023 Dressing Removed: Yes Quantity of Sponges/Gauze Removed:  1 black Canister Changed: Yes Canister Exudate Volume: 50 Dressing Reapplied: Yes Quantity of Sponges/Gauze Inserted: 1 black Respones T Treatment: o tolerated well Days On NPWT : 22 Electronic Signature(s) Signed: 03/01/2023 3:53:55 PM By: Erenest Blank Entered By: Erenest Blank on 03/01/2023 11:37:04 -------------------------------------------------------------------------------- Patient/Caregiver Education Details Patient Name: Date of Service: [name] 3/7/2024andnbsp9:30 A M Medical Record Number: [id number] Patient Account Number: [id number] Date of Birth/Gender: Treating RN: [birth year] (72 y.o. M) Primary Care Physician: Maggie Font Other Clinician: Erenest Blank Referring Physician: Treating Physician/Extender: Kalman Shan  Isabella Stalling in Treatment: 7 Education Assessment Education Provided To: Patient Education Topics Provided Engineer, maintenance) Signed: 03/01/2023 3:53:55 PM By: Erenest Blank Entered By: Erenest Blank on 03/01/2023 11:37:28 -------------------------------------------------------------------------------- Wound Assessment Details Patient Name: Date of Service: Vincent Eng Lewis. 03/01/2023 9:30 A M Medical Record Number: [id number] Patient Account Number: [id number] Date of Birth/Sex: Treating RN: Aug 01, 1951 (72 y.o. Collan Facchini, [name] ([id number] Page 3 of 4 Primary Care Blake Vetrano: Maggie Font Other Clinician: Referring Hailley Byers: Treating [name]/Extender: Wilnette Kales in Treatment: 7 Wound Status Wound Number: 1 Primary Etiology: Arterial Insufficiency Ulcer Wound Location: Right, Anterior Lower Leg Wound Status: Open Wounding Event: Trauma Date Acquired: 09/13/2022 Weeks Of Treatment: 7 Clustered Wound: No Wound Measurements Length: (cm) 15 Width: (cm) 18 Depth: (cm) 0.1 Area: (cm) 212.058 Volume: (cm) 21.206 % Reduction in  Area: -15.4% % Reduction in Volume: 42.3% Wound Description Classification: Full Thickness With Exposed Support S Exudate Amount: Large Exudate Type: Serosanguineous Exudate Color: red, brown tructures Periwound Skin Texture Texture Color No Abnormalities Noted: No No Abnormalities Noted: No Moisture No Abnormalities Noted: No Electronic Signature(s) Signed: 03/01/2023 3:53:55 PM By: Erenest Blank Entered By: Erenest Blank on 03/01/2023 10:20:20 -------------------------------------------------------------------------------- Wound Assessment Details Patient Name: Date of Service: Vincent Eng Lewis. 03/01/2023 9:30 A M Medical Record Number: [id number] Patient Account Number: [id number] Date of Birth/Sex: Treating RN: [birth year] (72 y.o. M) Primary Care Adelina Collard: Maggie Font Other Clinician: Referring Minas Bonser: Treating Elliotte Marsalis/Extender: Wilnette Kales in Treatment: 7 Wound Status Wound Number: 2 Primary Etiology: Dehisced Wound Wound Location: Right, Medial Lower Leg Wound Status: Open Wounding Event: Surgical Injury Date Acquired: 01/03/2023 Weeks Of Treatment: 7 Clustered Wound: No Wound Measurements Length: (cm) 8 Width: (cm) 3 Depth: (cm) 0.1 Area: (cm) 18.85 Volume: (cm) 1.885 % Reduction in Area: -101.7% % Reduction in Volume: 49.6% Wound Description Classification: Full Thickness With Exposed Support S Exudate Amount: Medium Exudate Type: Serosanguineous Exudate Color: red, brown tructures Periwound Skin Texture Texture Color No Abnormalities Noted: No No Abnormalities Noted: No [name], [name] ([id number] Page 4 of 4 Moisture No Abnormalities Noted: No Electronic Signature(s) Signed: 03/01/2023 3:53:55 PM By: Erenest Blank Entered By: Erenest Blank on 03/01/2023 10:20:20 -------------------------------------------------------------------------------- Vitals Details Patient  Name: Date of Service: Vincent Eng Lewis. 03/01/2023 9:30 A M Medical Record Number: [id number] Patient Account Number: [id number] Date of Birth/Sex: Treating RN: Dec 14, 1951 (72 y.o. M) Primary Care Kilani Joffe: Maggie Font Other Clinician: Referring Khalif Stender: Treating Joshlynn Alfonzo/Extender: Wilnette Kales in Treatment: 7 Vital Signs Time Taken: 10:19 Reference Range: 80 - 120 mg / dl Height (in): 71 Weight (lbs): 232 Body Mass Index (BMI): 32.4 Electronic Signature(s) Signed: 03/01/2023 3:53:55 PM By: Erenest Blank Entered By: Erenest Blank on 03/01/2023 10:19:58

## 2023-03-03 NOTE — Progress Notes (Signed)
JENE, GUTHERIE ([id number]) [id number] Page 1 of 1 Visit Report for 03/01/2023 SuperBill Details Patient Name: Date of Service: [name] 03/01/2023 Medical Record Number: [id number] Patient Account Number: [id number] Date of Birth/Sex: Treating RN: 1951-07-01 (72 y.o. M) Primary Care Provider: Maggie Font Other Clinician: Referring Provider: Treating Provider/Extender: Wilnette Kales in Treatment: 7 Diagnosis Coding ICD-10 Codes Code Description 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 without evidence L97.815 of necrosis L97.812 Non-pressure chronic ulcer of other part of right lower leg with fat layer exposed I70.238 Atherosclerosis of native arteries of right leg with ulceration of other part of lower leg I87.311 Chronic venous hypertension (idiopathic) with ulcer of right lower extremity T81.31XA Disruption of external operation (surgical) wound, not elsewhere classified, initial encounter Facility Procedures CPT4 Code Description Modifier Quantity [id number] - WOUND VAC-50 SQ CM OR LESS 1 Electronic Signature(s) Signed: 03/01/2023 12:51:47 PM By: Kalman Shan DO Signed: 03/01/2023 3:53:55 PM By: Erenest Blank Entered By: Erenest Blank on 03/01/2023 11:38:00

## 2023-03-05 ENCOUNTER — Ambulatory Visit (HOSPITAL_BASED_OUTPATIENT_CLINIC_OR_DEPARTMENT_OTHER): Payer: Medicare Other | Admitting: Internal Medicine

## 2023-03-05 ENCOUNTER — Encounter (HOSPITAL_COMMUNITY): Payer: Self-pay | Admitting: Internal Medicine

## 2023-03-05 ENCOUNTER — Inpatient Hospital Stay (HOSPITAL_COMMUNITY)
Admission: AD | Admit: 2023-03-05 | Discharge: 2023-03-13 | DRG: 240 | Disposition: A | Discharge status: Rehabilitation Facility | Payer: Medicare Other | Attending: Student | Admitting: Student

## 2023-03-05 ENCOUNTER — Other Ambulatory Visit: Payer: Self-pay

## 2023-03-05 ENCOUNTER — Inpatient Hospital Stay (HOSPITAL_COMMUNITY)
Admission: AD | Disposition: A | Discharge status: Rehabilitation Facility | Payer: Self-pay | Source: Home / Self Care | Attending: Student

## 2023-03-05 DIAGNOSIS — L089 Local infection of the skin and subcutaneous tissue, unspecified: Secondary | ICD-10-CM | POA: Diagnosis not present

## 2023-03-05 DIAGNOSIS — Z4781 Encounter for orthopedic aftercare following surgical amputation: Secondary | ICD-10-CM | POA: Diagnosis not present

## 2023-03-05 DIAGNOSIS — Z7982 Long term (current) use of aspirin: Secondary | ICD-10-CM | POA: Diagnosis not present

## 2023-03-05 DIAGNOSIS — M79604 Pain in right leg: Secondary | ICD-10-CM | POA: Diagnosis not present

## 2023-03-05 DIAGNOSIS — D649 Anemia, unspecified: Secondary | ICD-10-CM | POA: Diagnosis present

## 2023-03-05 DIAGNOSIS — I70201 Unspecified atherosclerosis of native arteries of extremities, right leg: Secondary | ICD-10-CM | POA: Diagnosis not present

## 2023-03-05 DIAGNOSIS — N189 Chronic kidney disease, unspecified: Secondary | ICD-10-CM | POA: Diagnosis present

## 2023-03-05 DIAGNOSIS — Z8349 Family history of other endocrine, nutritional and metabolic diseases: Secondary | ICD-10-CM

## 2023-03-05 DIAGNOSIS — Z9079 Acquired absence of other genital organ(s): Secondary | ICD-10-CM

## 2023-03-05 DIAGNOSIS — E875 Hyperkalemia: Secondary | ICD-10-CM | POA: Diagnosis present

## 2023-03-05 DIAGNOSIS — S78111A Complete traumatic amputation at level between right hip and knee, initial encounter: Secondary | ICD-10-CM | POA: Diagnosis not present

## 2023-03-05 DIAGNOSIS — N179 Acute kidney failure, unspecified: Secondary | ICD-10-CM | POA: Diagnosis present

## 2023-03-05 DIAGNOSIS — M6282 Rhabdomyolysis: Secondary | ICD-10-CM | POA: Diagnosis not present

## 2023-03-05 DIAGNOSIS — I129 Hypertensive chronic kidney disease with stage 1 through stage 4 chronic kidney disease, or unspecified chronic kidney disease: Secondary | ICD-10-CM | POA: Diagnosis present

## 2023-03-05 DIAGNOSIS — I10 Essential (primary) hypertension: Secondary | ICD-10-CM | POA: Diagnosis not present

## 2023-03-05 DIAGNOSIS — L03115 Cellulitis of right lower limb: Secondary | ICD-10-CM | POA: Insufficient documentation

## 2023-03-05 DIAGNOSIS — E8729 Other acidosis: Secondary | ICD-10-CM | POA: Diagnosis not present

## 2023-03-05 DIAGNOSIS — G546 Phantom limb syndrome with pain: Secondary | ICD-10-CM | POA: Diagnosis not present

## 2023-03-05 DIAGNOSIS — D75839 Thrombocytosis, unspecified: Secondary | ICD-10-CM | POA: Diagnosis present

## 2023-03-05 DIAGNOSIS — F419 Anxiety disorder, unspecified: Secondary | ICD-10-CM | POA: Diagnosis present

## 2023-03-05 DIAGNOSIS — D72825 Bandemia: Secondary | ICD-10-CM | POA: Insufficient documentation

## 2023-03-05 DIAGNOSIS — T8131XA Disruption of external operation (surgical) wound, not elsewhere classified, initial encounter: Secondary | ICD-10-CM | POA: Diagnosis not present

## 2023-03-05 DIAGNOSIS — Z923 Personal history of irradiation: Secondary | ICD-10-CM | POA: Diagnosis not present

## 2023-03-05 DIAGNOSIS — E871 Hypo-osmolality and hyponatremia: Secondary | ICD-10-CM | POA: Diagnosis not present

## 2023-03-05 DIAGNOSIS — R509 Fever, unspecified: Secondary | ICD-10-CM | POA: Diagnosis not present

## 2023-03-05 DIAGNOSIS — Z91013 Allergy to seafood: Secondary | ICD-10-CM

## 2023-03-05 DIAGNOSIS — Z8042 Family history of malignant neoplasm of prostate: Secondary | ICD-10-CM

## 2023-03-05 DIAGNOSIS — R32 Unspecified urinary incontinence: Secondary | ICD-10-CM | POA: Diagnosis present

## 2023-03-05 DIAGNOSIS — D631 Anemia in chronic kidney disease: Secondary | ICD-10-CM | POA: Diagnosis not present

## 2023-03-05 DIAGNOSIS — K219 Gastro-esophageal reflux disease without esophagitis: Secondary | ICD-10-CM | POA: Diagnosis not present

## 2023-03-05 DIAGNOSIS — I70221 Atherosclerosis of native arteries of extremities with rest pain, right leg: Principal | ICD-10-CM | POA: Diagnosis present

## 2023-03-05 DIAGNOSIS — E785 Hyperlipidemia, unspecified: Secondary | ICD-10-CM | POA: Diagnosis present

## 2023-03-05 DIAGNOSIS — N1831 Chronic kidney disease, stage 3a: Secondary | ICD-10-CM | POA: Diagnosis not present

## 2023-03-05 DIAGNOSIS — R748 Abnormal levels of other serum enzymes: Secondary | ICD-10-CM | POA: Diagnosis present

## 2023-03-05 DIAGNOSIS — I739 Peripheral vascular disease, unspecified: Secondary | ICD-10-CM | POA: Diagnosis not present

## 2023-03-05 DIAGNOSIS — Z79899 Other long term (current) drug therapy: Secondary | ICD-10-CM

## 2023-03-05 DIAGNOSIS — Z87891 Personal history of nicotine dependence: Secondary | ICD-10-CM

## 2023-03-05 DIAGNOSIS — N1330 Unspecified hydronephrosis: Secondary | ICD-10-CM | POA: Diagnosis present

## 2023-03-05 DIAGNOSIS — Z89611 Acquired absence of right leg above knee: Secondary | ICD-10-CM | POA: Diagnosis not present

## 2023-03-05 DIAGNOSIS — T8189XA Other complications of procedures, not elsewhere classified, initial encounter: Secondary | ICD-10-CM | POA: Diagnosis not present

## 2023-03-05 DIAGNOSIS — E872 Acidosis, unspecified: Secondary | ICD-10-CM | POA: Diagnosis present

## 2023-03-05 DIAGNOSIS — Z8546 Personal history of malignant neoplasm of prostate: Secondary | ICD-10-CM | POA: Diagnosis not present

## 2023-03-05 DIAGNOSIS — K5900 Constipation, unspecified: Secondary | ICD-10-CM | POA: Diagnosis present

## 2023-03-05 DIAGNOSIS — L97921 Non-pressure chronic ulcer of unspecified part of left lower leg limited to breakdown of skin: Secondary | ICD-10-CM | POA: Diagnosis not present

## 2023-03-05 DIAGNOSIS — F4323 Adjustment disorder with mixed anxiety and depressed mood: Secondary | ICD-10-CM | POA: Diagnosis not present

## 2023-03-05 DIAGNOSIS — I70261 Atherosclerosis of native arteries of extremities with gangrene, right leg: Secondary | ICD-10-CM | POA: Diagnosis not present

## 2023-03-05 DIAGNOSIS — N281 Cyst of kidney, acquired: Secondary | ICD-10-CM | POA: Diagnosis not present

## 2023-03-05 DIAGNOSIS — Z800 Family history of malignant neoplasm of digestive organs: Secondary | ICD-10-CM

## 2023-03-05 DIAGNOSIS — G8918 Other acute postprocedural pain: Secondary | ICD-10-CM | POA: Diagnosis not present

## 2023-03-05 DIAGNOSIS — I96 Gangrene, not elsewhere classified: Secondary | ICD-10-CM | POA: Diagnosis not present

## 2023-03-05 DIAGNOSIS — I70238 Atherosclerosis of native arteries of right leg with ulceration of other part of lower right leg: Secondary | ICD-10-CM | POA: Diagnosis not present

## 2023-03-05 DIAGNOSIS — S81801A Unspecified open wound, right lower leg, initial encounter: Secondary | ICD-10-CM | POA: Diagnosis not present

## 2023-03-05 HISTORY — PX: ABDOMINAL AORTOGRAM W/LOWER EXTREMITY: CATH118223

## 2023-03-05 HISTORY — DX: Family history of other endocrine, nutritional and metabolic diseases: Z83.49

## 2023-03-05 LAB — POCT I-STAT, CHEM 8
BUN: 128 mg/dL — ABNORMAL HIGH (ref 8–23)
BUN: 127 mg/dL — ABNORMAL HIGH (ref 8–23)
Calcium, Ion: 1.16 mmol/L (ref 1.15–1.40)
Calcium, Ion: 1.18 mmol/L (ref 1.15–1.40)
Chloride: 104 mmol/L (ref 98–111)
Chloride: 106 mmol/L (ref 98–111)
Creatinine, Ser: 5.1 mg/dL — ABNORMAL HIGH (ref 0.61–1.24)
Creatinine, Ser: 5.1 mg/dL — ABNORMAL HIGH (ref 0.61–1.24)
Glucose, Bld: 155 mg/dL — ABNORMAL HIGH (ref 70–99)
Glucose, Bld: 150 mg/dL — ABNORMAL HIGH (ref 70–99)
HCT: 23 % — ABNORMAL LOW (ref 39.0–52.0)
HCT: 21 % — ABNORMAL LOW (ref 39.0–52.0)
Hemoglobin: 7.8 g/dL — ABNORMAL LOW (ref 13.0–17.0)
Hemoglobin: 7.1 g/dL — ABNORMAL LOW (ref 13.0–17.0)
Potassium: 5.5 mmol/L — ABNORMAL HIGH (ref 3.5–5.1)
Potassium: 6 mmol/L — ABNORMAL HIGH (ref 3.5–5.1)
Sodium: 132 mmol/L — ABNORMAL LOW (ref 135–145)
Sodium: 133 mmol/L — ABNORMAL LOW (ref 135–145)
TCO2: 18 mmol/L — ABNORMAL LOW (ref 22–32)
TCO2: 18 mmol/L — ABNORMAL LOW (ref 22–32)

## 2023-03-05 LAB — COMPREHENSIVE METABOLIC PANEL
ALT: 93 U/L — ABNORMAL HIGH (ref 0–44)
AST: 64 U/L — ABNORMAL HIGH (ref 15–41)
Albumin: 2.4 g/dL — ABNORMAL LOW (ref 3.5–5.0)
Alkaline Phosphatase: 104 U/L (ref 38–126)
Anion gap: 14 (ref 5–15)
BUN: 122 mg/dL — ABNORMAL HIGH (ref 8–23)
CO2: 20 mmol/L — ABNORMAL LOW (ref 22–32)
Calcium: 9.1 mg/dL (ref 8.9–10.3)
Chloride: 101 mmol/L (ref 98–111)
Creatinine, Ser: 4 mg/dL — ABNORMAL HIGH (ref 0.61–1.24)
GFR, Estimated: 15 mL/min — ABNORMAL LOW (ref >60)
Glucose, Bld: 130 mg/dL — ABNORMAL HIGH (ref 70–99)
Potassium: 4.9 mmol/L (ref 3.5–5.1)
Sodium: 135 mmol/L (ref 135–145)
Total Bilirubin: 0.5 mg/dL (ref 0.3–1.2)
Total Protein: 7 g/dL (ref 6.5–8.1)

## 2023-03-05 LAB — BASIC METABOLIC PANEL
Anion gap: 17 — ABNORMAL HIGH (ref 5–15)
BUN: 136 mg/dL — ABNORMAL HIGH (ref 8–23)
CO2: 13 mmol/L — ABNORMAL LOW (ref 22–32)
Calcium: 9.4 mg/dL (ref 8.9–10.3)
Chloride: 104 mmol/L (ref 98–111)
Creatinine, Ser: 4.42 mg/dL — ABNORMAL HIGH (ref 0.61–1.24)
GFR, Estimated: 13 mL/min — ABNORMAL LOW (ref >60)
Glucose, Bld: 149 mg/dL — ABNORMAL HIGH (ref 70–99)
Potassium: 6.1 mmol/L — ABNORMAL HIGH (ref 3.5–5.1)
Sodium: 134 mmol/L — ABNORMAL LOW (ref 135–145)

## 2023-03-05 LAB — CBC
HCT: 20.6 % — ABNORMAL LOW (ref 39.0–52.0)
Hemoglobin: 7 g/dL — ABNORMAL LOW (ref 13.0–17.0)
MCH: 28.5 pg (ref 26.0–34.0)
MCHC: 34 g/dL (ref 30.0–36.0)
MCV: 83.7 fL (ref 80.0–100.0)
Platelets: 590 10*3/uL — ABNORMAL HIGH (ref 150–400)
RBC: 2.46 MIL/uL — ABNORMAL LOW (ref 4.22–5.81)
RDW: 17.2 % — ABNORMAL HIGH (ref 11.5–15.5)
WBC: 16.6 10*3/uL — ABNORMAL HIGH (ref 4.0–10.5)
nRBC: 0 % (ref 0.0–0.2)

## 2023-03-05 SURGERY — ABDOMINAL AORTOGRAM W/LOWER EXTREMITY
Anesthesia: LOCAL

## 2023-03-05 MED ORDER — ACETAMINOPHEN 325 MG PO TABS
650.0000 mg | ORAL_TABLET | Freq: Four times a day (QID) | ORAL | Status: DC | PRN
  Administered 2023-03-08 – 2023-03-13 (×2): 650 mg via ORAL
  Filled 2023-03-05 (×3): qty 2

## 2023-03-05 MED ORDER — FENTANYL CITRATE (PF) 100 MCG/2ML IJ SOLN
INTRAMUSCULAR | Status: AC
  Filled 2023-03-05: qty 2

## 2023-03-05 MED ORDER — SODIUM CHLORIDE 0.9% FLUSH
3.0000 mL | Freq: Two times a day (BID) | INTRAVENOUS | Status: DC
  Administered 2023-03-06 – 2023-03-12 (×9): 3 mL via INTRAVENOUS

## 2023-03-05 MED ORDER — SODIUM ZIRCONIUM CYCLOSILICATE 10 G PO PACK: 10.0000 g | PACK | Freq: Once | ORAL | Status: DC

## 2023-03-05 MED ORDER — OXYCODONE HCL 5 MG PO TABS
ORAL_TABLET | ORAL | Status: AC
  Filled 2023-03-05: qty 1

## 2023-03-05 MED ORDER — OXYCODONE HCL 5 MG PO TABS
5.0000 mg | ORAL_TABLET | ORAL | Status: DC | PRN
  Administered 2023-03-05: 10 mg via ORAL
  Administered 2023-03-05: 5 mg via ORAL
  Administered 2023-03-06 – 2023-03-12 (×17): 10 mg via ORAL
  Administered 2023-03-13: 5 mg via ORAL
  Filled 2023-03-05 (×5): qty 2
  Filled 2023-03-05: qty 1
  Filled 2023-03-05 (×13): qty 2

## 2023-03-05 MED ORDER — ATORVASTATIN CALCIUM 40 MG PO TABS
40.0000 mg | ORAL_TABLET | Freq: Every morning | ORAL | Status: DC
  Administered 2023-03-06 – 2023-03-08 (×3): 40 mg via ORAL
  Filled 2023-03-05 (×3): qty 1

## 2023-03-05 MED ORDER — LIDOCAINE HCL (PF) 1 % IJ SOLN
INTRAMUSCULAR | Status: DC | PRN
  Administered 2023-03-05: 25 mL via INTRADERMAL
  Administered 2023-03-05: 1 mL via INTRADERMAL

## 2023-03-05 MED ORDER — STERILE WATER FOR INJECTION IV SOLN: INTRAVENOUS | Status: DC

## 2023-03-05 MED ORDER — SODIUM CHLORIDE 0.9 % IV SOLN: INTRAVENOUS | Status: DC

## 2023-03-05 MED ORDER — ACETAMINOPHEN 650 MG RE SUPP: 650.0000 mg | Freq: Four times a day (QID) | RECTAL | Status: DC | PRN

## 2023-03-05 MED ORDER — HYDRALAZINE HCL 20 MG/ML IJ SOLN: 5.0000 mg | INTRAMUSCULAR | Status: DC | PRN

## 2023-03-05 MED ORDER — METOPROLOL TARTRATE 12.5 MG HALF TABLET
12.5000 mg | ORAL_TABLET | Freq: Two times a day (BID) | ORAL | Status: DC
  Administered 2023-03-05 – 2023-03-13 (×16): 12.5 mg via ORAL
  Filled 2023-03-05 (×16): qty 1

## 2023-03-05 MED ORDER — SODIUM ZIRCONIUM CYCLOSILICATE 10 G PO PACK
10.0000 g | PACK | Freq: Once | ORAL | Status: AC
  Administered 2023-03-05: 10 g via ORAL
  Filled 2023-03-05: qty 1

## 2023-03-05 MED ORDER — LORAZEPAM 0.5 MG PO TABS
0.2500 mg | ORAL_TABLET | Freq: Four times a day (QID) | ORAL | Status: DC | PRN
  Administered 2023-03-05 – 2023-03-12 (×2): 0.25 mg via ORAL
  Filled 2023-03-05 (×3): qty 1

## 2023-03-05 MED ORDER — HEPARIN (PORCINE) IN NACL 1000-0.9 UT/500ML-% IV SOLN
INTRAVENOUS | Status: DC | PRN
  Administered 2023-03-05 (×2): 500 mL

## 2023-03-05 MED ORDER — SODIUM CHLORIDE 0.9 % IV SOLN
250.0000 mL | INTRAVENOUS | Status: DC | PRN
  Administered 2023-03-06: 250 mL via INTRAVENOUS

## 2023-03-05 MED ORDER — PANTOPRAZOLE SODIUM 40 MG PO TBEC
40.0000 mg | DELAYED_RELEASE_TABLET | Freq: Every day | ORAL | Status: DC
  Administered 2023-03-06 – 2023-03-13 (×8): 40 mg via ORAL
  Filled 2023-03-05 (×8): qty 1

## 2023-03-05 MED ORDER — SODIUM CHLORIDE 0.9% FLUSH
3.0000 mL | Freq: Two times a day (BID) | INTRAVENOUS | Status: DC
  Administered 2023-03-07 – 2023-03-12 (×11): 3 mL via INTRAVENOUS

## 2023-03-05 MED ORDER — ONDANSETRON HCL 4 MG/2ML IJ SOLN: 4.0000 mg | Freq: Four times a day (QID) | INTRAMUSCULAR | Status: DC | PRN

## 2023-03-05 MED ORDER — FENTANYL CITRATE (PF) 100 MCG/2ML IJ SOLN
INTRAMUSCULAR | Status: DC | PRN
  Administered 2023-03-05: 50 ug via INTRAVENOUS

## 2023-03-05 MED ORDER — HEPARIN SODIUM (PORCINE) 5000 UNIT/ML IJ SOLN
5000.0000 [IU] | Freq: Three times a day (TID) | INTRAMUSCULAR | Status: DC
  Administered 2023-03-05 – 2023-03-13 (×21): 5000 [IU] via SUBCUTANEOUS
  Filled 2023-03-05 (×23): qty 1

## 2023-03-05 MED ORDER — STERILE WATER FOR INJECTION IV SOLN
INTRAVENOUS | Status: DC
  Filled 2023-03-05 (×4): qty 1000

## 2023-03-05 MED ORDER — MIDAZOLAM HCL 5 MG/5ML IJ SOLN
INTRAMUSCULAR | Status: AC
  Filled 2023-03-05: qty 5

## 2023-03-05 MED ORDER — MIDAZOLAM HCL 2 MG/2ML IJ SOLN
INTRAMUSCULAR | Status: DC | PRN
  Administered 2023-03-05: 2 mg via INTRAVENOUS

## 2023-03-05 MED ORDER — LIDOCAINE HCL (PF) 1 % IJ SOLN
INTRAMUSCULAR | Status: AC
  Filled 2023-03-05: qty 30

## 2023-03-05 MED ORDER — ACETAMINOPHEN 325 MG PO TABS: 650.0000 mg | ORAL_TABLET | ORAL | Status: DC | PRN

## 2023-03-05 MED ORDER — SODIUM CHLORIDE 0.9% FLUSH: 3.0000 mL | INTRAVENOUS | Status: DC | PRN

## 2023-03-05 MED ORDER — ALBUTEROL SULFATE (2.5 MG/3ML) 0.083% IN NEBU: 2.5000 mg | INHALATION_SOLUTION | Freq: Four times a day (QID) | RESPIRATORY_TRACT | Status: DC | PRN

## 2023-03-05 MED ORDER — LABETALOL HCL 5 MG/ML IV SOLN: 10.0000 mg | INTRAVENOUS | Status: DC | PRN

## 2023-03-05 SURGICAL SUPPLY — 15 items
CATH OMNI FLUSH 5F 65CM (CATHETERS) IMPLANT
CLOSURE MYNX CONTROL 5F (Vascular Products) IMPLANT
GLIDEWIRE NITREX 0.018X80X5 (WIRE) ×1
GUIDEWIRE NITREX 0.018X80X5 (WIRE) IMPLANT
KIT MICROPUNCTURE NIT STIFF (SHEATH) IMPLANT
KIT PV (KITS) ×1 IMPLANT
SHEATH MICROPUNCTURE PEDAL 4FR (SHEATH) IMPLANT
SHEATH PINNACLE 5F 10CM (SHEATH) IMPLANT
SHEATH PROBE COVER 6X72 (BAG) IMPLANT
STOPCOCK MORSE 400PSI 3WAY (MISCELLANEOUS) IMPLANT
SYR MEDRAD MARK 7 150ML (SYRINGE) ×1 IMPLANT
TRANSDUCER W/STOPCOCK (MISCELLANEOUS) ×1 IMPLANT
TRAY PV CATH (CUSTOM PROCEDURE TRAY) ×1 IMPLANT
TUBING CIL FLEX 10 FLL-RA (TUBING) IMPLANT
WIRE BENTSON .035X145CM (WIRE) IMPLANT

## 2023-03-05 NOTE — H&P (Addendum)
History and Physical    Patient: [name] [id number] DOB: Dec 26, 1950 DOA: 03/05/2023 DOS: the patient was seen and examined on 03/05/2023 PCP: Iona Beard, MD  Patient coming from: Preop  Chief Complaint: No chief complaint on file.  HPI: [name] is a 72 y.o. male with medical history significant of hypertension, hyperlipidemia, PAD s/p nephrolithiasis, prostate cancer s/p radiation, and GERD who presents with complaints of right leg pain.  Patient had undergone right common femoral posterior tibial arterial bypass on 12/12/2022 with Dr. Donzetta Matters for nonhealing wounds of the right lower extremity.   He had also been being followed by wound clinic for wounds of the right lower extremity and wound VAC In place to that extremity.  he had been noted to have thrombosis of the graft on visit from 2/21 where ABI on the right was noted to be 0.    Plan was for attempted revascularization, but ultimately thought patient would be amputation.  Patient notes that he has been urinating, but feels like he is retaining at times.  He has been using pads as he reports that he has had urinary incontinence ever since receiving radiation therapy.  Preop labs noted hemoglobin 7.1, sodium 134, potassium 6.1, CO2 13, BUN 136, creatinine 4.42, and glucose 149,  and anion gap 17.  Patient was placed on normal saline IV fluids at 100 mL/h during preop.  During the procedure CO2 was used instead of contrast due to patient's acutely worsening kidney function.  However, attempts to cannulate both the anterior tibial and posterior tibial artery which were unsuccessful was they were chronically thrombosed.  Plan at this time is for likely need of amputation.  Review of Systems: As mentioned in the history of present illness. All other systems reviewed and are negative. Past Medical History:  Diagnosis Date   GERD (gastroesophageal reflux disease)    History of kidney stones    Hyperlipidemia     Hypertension    Prostate cancer [facility]) 2011   radiation june 2020   Umbilical hernia    Wears dentures    full   Wears glasses    Wears partial dentures    lower   Past Surgical History:  Procedure Laterality Date   ABDOMINAL AORTOGRAM W/LOWER EXTREMITY N/A 11/20/2022   Procedure: ABDOMINAL AORTOGRAM W/LOWER EXTREMITY;  Surgeon: Waynetta Sandy, MD;  Location: [place] [facility];  Service: Cardiovascular;  Laterality: N/A;   COLONOSCOPY N/A 08/25/2020   Procedure: COLONOSCOPY;  Surgeon: Daneil Dolin, MD;  [facility];  Service: Endoscopy;  Laterality: N/A;  9:30   COLONOSCOPY     CYSTOSCOPY WITH LITHOLAPAXY N/A 05/18/2021   Procedure: CYSTOSCOPY WITH LITHOLAPAXY;  Surgeon: Ceasar Mons, MD;  Location: [facility];  Service: Urology;  Laterality: N/A;  ONLY NEEDS  30 MIN   FEMORAL-TIBIAL BYPASS GRAFT Right 12/12/2022   Procedure: RIGHT COMMON FEMORAL-POSTERIOR TIBIAL ARTERY BYPASS WITH VEIN HARVESTING OF THE GREATER SAPHENOUS VEIN AND FEMORAL ENDARTERECTOMY WITH COMPOSITE GRAFT;  Surgeon: Waynetta Sandy, MD;  Location: [place];  Service: Vascular;  Laterality: Right;   POLYPECTOMY  08/25/2020   Procedure: POLYPECTOMY;  Surgeon: Daneil Dolin, MD;  [facility];  Service: Endoscopy;;   ROBOT ASSISTED LAPAROSCOPIC RADICAL PROSTATECTOMY  2011   TONSILLECTOMY  age 82   and adenoids removed   Social History:  reports that he quit smoking about 3 months ago. His smoking use included cigarettes. He has a 5.25 pack-year smoking  history. He has never used smokeless tobacco. He reports that he does not drink alcohol and does not use drugs.  Allergies  Allergen Reactions   Shellfish Allergy Swelling    Family History  Problem Relation Age of Onset   Gastric cancer Mother    Prostate cancer Father    Breast cancer Neg Hx    Pancreatic cancer Neg Hx     Prior to Admission medications   Medication Sig Start Date End Date  Taking? Authorizing Provider  acetaminophen-codeine (TYLENOL #3) 300-30 MG tablet Take 1 tablet by mouth 2 (two) times daily. 02/20/23  Yes [provider]  aspirin EC 81 MG tablet Take 81 mg by mouth daily.   Yes [provider]  atorvastatin (LIPITOR) 40 MG tablet Take 1 tablet (40 mg total) by mouth every morning. 12/16/22  Yes Waynetta Sandy, MD  esomeprazole (NEXIUM) 20 MG capsule Take 20 mg by mouth daily.   Yes [provider]  furosemide (LASIX) 20 MG tablet Take 20 mg by mouth daily. 01/11/23  Yes [provider]  lisinopril-hydrochlorothiazide (PRINZIDE,ZESTORETIC) 10-12.5 MG tablet Take 1 tablet by mouth daily.   Yes [provider]  meloxicam (MOBIC) 15 MG tablet Take 1 tablet by mouth once daily 02/28/23  Yes Edrick Kins, DPM  metoprolol tartrate (LOPRESSOR) 25 MG tablet Take 1/2 (one-half) tablet by mouth twice daily 02/16/23  Yes Waynetta Sandy, MD  Multiple Vitamins-Minerals (CENTRUM SILVER PO) Take 1 tablet by mouth daily.    Yes [provider]  oxybutynin (DITROPAN-XL) 10 MG 24 hr tablet Take 10 mg by mouth daily. 01/09/19  Yes [provider]  potassium chloride SA (KLOR-CON M) 20 MEQ tablet Take 10 mEq by mouth daily. 01/11/23  Yes [provider]  gabapentin (NEURONTIN) 100 MG capsule Take 1 capsule (100 mg total) by mouth 3 (three) times daily. Patient not taking: Reported on 02/28/2023 05/01/22   Edrick Kins, DPM  [facility] syringe  10/04/22   [provider]    Physical Exam: Vitals:   03/05/23 0942  BP: (!) 148/65  Pulse: (!) 105  Resp: 20  Temp: 98.5 F (36.9 C)  TempSrc: Oral  SpO2: 96%  Weight: 99.8 kg  Height: 6' (1.829 m)   Constitutional: Elderly male who appears to be in some Eyes: PERRL, lids and conjunctivae normal ENMT: Mucous membranes are moist. Posterior pharynx clear of any exudate or lesions.  Neck: normal, supple,   Respiratory: Tachypneic.   Currently O2 saturation maintained on room air. Cardiovascular: Regular rate and rhythm, no murmurs / rubs / gallops.  At least +2 pitting bilateral lower extremity edema Abdomen: Suprapubic tenderness to palpation.  Bowel sounds present all 4 quadrants.   Musculoskeletal: no clubbing / cyanosis. No joint deformity upper and lower extremities. Good ROM, no contractures. Normal muscle tone.  Skin: Large wounds noted of the right lower extremity Neurologic: CN 2-12 grossly intact.  Strength 5/5 in all 4.  Psychiatric: Normal judgment and insight. Alert and oriented x 3.  Anxious mood.   Data Reviewed:  Reviewed available labs, imaging, and pertinent records.  Assessment and Plan:  Critical ischemia of the right leg Patient presented right leg ischemia.  He underwent a arteriogram with Dr. Donzetta Matters using CO2 given elevated kidney function.  However, access was unable to be obtained and therefore amputation we will likely be recommended. -Admit to a telemetry bed -Oxycodone as needed for pain -TOC -Appreciate [facility] we will follow-up  for any further recommendations  Acute kidney injury superimposed on chronic kidney disease stage IIIa Metabolic acidosis with increased anion gap Patient was noted to have creatinine elevated up to 5.1 with BUN 127 via i-STAT.  Subsequently BMP noted CO2 13, BUN 136, creatinine 4.42, anion gap 17.  Suspect the elevated anion gap likely secondary to patient's acutely worsening kidney function.  Baseline creatinine previously been around 1.3-1.4 in 11/2022.  Patient has been placed on normal saline IV fluids at 100 mL/h during preop.  Given the elevated BUN to creatinine ratio suspect prerenal cause of symptoms.  Question if related to obstruction, medications(diuretics/NSAIDs), or prerenal secondary to possible bleed. -Strict I&Os -Hold nephrotoxic agents such as Mobic and diuretics -Check urine sodium, urine creatinine, urine urea -Check  urinalysis -Check renal ultrasound -Sodium bicarb at 125 mL/h -Continue to monitor kidney function daily -Will consult nephrology if kidney function does not appear to be improving  Hyperkalemia Acute.  Initial potassium elevated up to 6.1. Patient had been taking potassium chloride 10 meq -Hold potassium chloride supplement. -Lokelma 10 mg po x 1 dose -Recheck CMP postprocedure -Check EKG  Normocytic anemia Acute on chronic.  Hemoglobin down to 7.1 on check this morning in preop.  Hemoglobin previously have been around 8 g/dL in 11/2022.  With the significantly elevated BUN question the possibility of upper GI bleed.    -Type and screen for possible need of blood products -Continue to monitor H&H  Essential hypertension Blood pressures currently maintained 148/65. -Hold lisinopril-hydrochlorothiazide and furosemide due to AKI -Continue metoprolol as tolerated  Hyperlipidemia -Continue atorvastatin  Anxiety Postop patient was noted to be significantly anxious. -ativan 0.25 mg po as needed  History of prostate cancer Patient with prior history of prostate cancer and had undergone radiation treatment several years ago.   DVT prophylaxis: SCDs, and till able to rule out Advance Care Planning:   Code Status: Full Code   Consults: Vascular surgery  Family Communication: Patient sister updated over the phone  Severity of Illness: The appropriate patient status for this patient is INPATIENT. Inpatient status is judged to be reasonable and necessary in order to provide the required intensity of service to ensure the patient's safety. The patient's presenting symptoms, physical exam findings, and initial radiographic and laboratory data in the context of their chronic comorbidities is felt to place them at high risk for further clinical deterioration. Furthermore, it is not anticipated that the patient will be medically stable for discharge from the hospital within 2 midnights of  admission.   * I certify that at the point of admission it is my clinical judgment that the patient will require inpatient hospital care spanning beyond 2 midnights from the point of admission due to high intensity of service, high risk for further deterioration and high frequency of surveillance required.*  Author: Norval Morton, MD 03/05/2023 11:11 AM  For on call review [email or web address].

## 2023-03-05 NOTE — Progress Notes (Signed)
Pt arrived w/ home wound vac in place on R leg. On call vascular provider, Virl Cagey, MD paged. Call back & verbal order received to remove home vac & apply wet to dry dressing to R leg

## 2023-03-05 NOTE — Interval H&P Note (Signed)
History and Physical Interval Note:  03/05/2023 10:32 AM  [name]  has presented today for surgery, with the diagnosis of ischemia - right lower extremity.  The various methods of treatment have been discussed with the patient and family. After consideration of risks, benefits and other options for treatment, the patient has consented to  Procedure(s): ABDOMINAL AORTOGRAM W/LOWER EXTREMITY (N/A) as a surgical intervention.  The patient's history has been reviewed, patient examined, no change in status, stable for surgery.  I have reviewed the patient's chart and labs.  Questions were answered to the patient's satisfaction.     [name]

## 2023-03-05 NOTE — Progress Notes (Signed)
Patient is very anxious, trying to urinate. Dr. Tamala Julian at bedside

## 2023-03-05 NOTE — Op Note (Signed)
    Patient name: [name] MRN: [id number] DOB: 30-Apr-1951 Sex: male  03/05/2023 Pre-operative Diagnosis: Chronic right lower extremity limb threatening ischemia with toe and foot gangrene Post-operative diagnosis:  Same Surgeon:  Eda Paschal. Donzetta Matters, MD Procedure Performed: 1.  Ultrasound-guided cannulation left common femoral artery 2.  Selection of right common femoral artery.  Right lower extremity CO2 angiography 3.  Ultrasound-guided cannulation right anterior tibial and posterior tibial arteries 4.  Moderate sedation with fentanyl and Versed for 36 minutes 5.  Mynx device closure left common femoral artery  Indications: 72 year old male with a history of a right common femoral endarterectomy and femoral to posterior tibial artery bypass.  He has extensive wounds of the right lower extremity he has been counseled on the need for amputation but would like 1 last attempt at angiography with possible.  He now presents for angiography of the right lower extremity and his creatinine was severely elevated from baseline we elected to use CO2 and no contrast.  Findings: There is a very strong common femoral pulse.  The common femoral artery was patulous consistent with previous endarterectomy there does not appear to be runoff into the SFA.  Distally in the leg we can only visualize a collateral below the knee.  I attempted to cannulate both the anterior tibial and posterior tibial arteries but these were chronically thrombosed despite getting needles and the blood return there was no ability to pass a wire.  Patient will be scheduled for right above-knee amputation when he is medically stable.   Procedure:  The patient was identified in the holding area and taken to room 2.  The patient was then placed supine on the table and prepped and draped in the usual sterile fashion.  A time out was called.  Ultrasound was used to evaluate the left common femoral artery which is patent and  compressible.  The area was anesthetized 1% lidocaine cannulated by functional followed by wire and sheath.  And images saved the permanent record.  Concomitantly we administered fentanyl and Versed for moderate sedation and his bedside vitals were monitored by nursing.  We placed a Bentson wire followed by a 5 [place] sheath.  We crossed the bifurcation with Omni catheter elected not to perform aortogram given that this was just performed 4 months ago.  Will perform CO2 angiography of the right lower extremity there was really no runoff identified.  With this I elected to prepped out the right foot.  I then used ultrasound guidance to cannulate the posterior tibial and anterior tibial arteries but there was no backbleeding and a wire was passed.  Given the extensive gangrene patient will need amputation above the knee on the right.  We remove the wire and then applied a minx device to the left common femoral artery.  Patient did tolerate procedure without any complication.  Contrast: 36cc  Kendy Haston C. Donzetta Matters, MD [facility] Office: [phone] Pager: [phone]

## 2023-03-05 NOTE — Interval H&P Note (Signed)
History and Physical Interval Note:  03/05/2023 8:57 AM  [name]  has presented today for surgery, with the diagnosis of ischemia - right lower extremity.  The various methods of treatment have been discussed with the patient and family. After consideration of risks, benefits and other options for treatment, the patient has consented to  Procedure(s): ABDOMINAL AORTOGRAM W/LOWER EXTREMITY (N/A) as a surgical intervention.  The patient's history has been reviewed, patient examined, no change in status, stable for surgery.  I have reviewed the patient's chart and labs.  Questions were answered to the patient's satisfaction.     [name]

## 2023-03-05 NOTE — Progress Notes (Signed)
Much calmer now

## 2023-03-06 ENCOUNTER — Encounter (HOSPITAL_COMMUNITY): Payer: Self-pay | Admitting: Vascular Surgery

## 2023-03-06 ENCOUNTER — Inpatient Hospital Stay (HOSPITAL_COMMUNITY): Payer: Medicare Other

## 2023-03-06 DIAGNOSIS — E875 Hyperkalemia: Secondary | ICD-10-CM | POA: Diagnosis not present

## 2023-03-06 DIAGNOSIS — E8729 Other acidosis: Secondary | ICD-10-CM | POA: Diagnosis not present

## 2023-03-06 DIAGNOSIS — I70221 Atherosclerosis of native arteries of extremities with rest pain, right leg: Secondary | ICD-10-CM | POA: Diagnosis not present

## 2023-03-06 DIAGNOSIS — N179 Acute kidney failure, unspecified: Secondary | ICD-10-CM | POA: Diagnosis not present

## 2023-03-06 LAB — BASIC METABOLIC PANEL
Anion gap: 11 (ref 5–15)
BUN: 113 mg/dL — ABNORMAL HIGH (ref 8–23)
CO2: 23 mmol/L (ref 22–32)
Calcium: 8.5 mg/dL — ABNORMAL LOW (ref 8.9–10.3)
Chloride: 99 mmol/L (ref 98–111)
Creatinine, Ser: 3.56 mg/dL — ABNORMAL HIGH (ref 0.61–1.24)
GFR, Estimated: 17 mL/min — ABNORMAL LOW (ref >60)
Glucose, Bld: 147 mg/dL — ABNORMAL HIGH (ref 70–99)
Potassium: 4.7 mmol/L (ref 3.5–5.1)
Sodium: 133 mmol/L — ABNORMAL LOW (ref 135–145)

## 2023-03-06 LAB — CBC
HCT: 17 % — ABNORMAL LOW (ref 39.0–52.0)
Hemoglobin: 5.9 g/dL — CL (ref 13.0–17.0)
MCH: 29.4 pg (ref 26.0–34.0)
MCHC: 34.7 g/dL (ref 30.0–36.0)
MCV: 84.6 fL (ref 80.0–100.0)
Platelets: 490 10*3/uL — ABNORMAL HIGH (ref 150–400)
RBC: 2.01 MIL/uL — ABNORMAL LOW (ref 4.22–5.81)
RDW: 17.2 % — ABNORMAL HIGH (ref 11.5–15.5)
WBC: 14.4 10*3/uL — ABNORMAL HIGH (ref 4.0–10.5)
nRBC: 0 % (ref 0.0–0.2)

## 2023-03-06 LAB — PREPARE RBC (CROSSMATCH)

## 2023-03-06 LAB — URINALYSIS, W/ REFLEX TO CULTURE (INFECTION SUSPECTED)
Bilirubin Urine: NEGATIVE
Glucose, UA: NEGATIVE mg/dL
Ketones, ur: NEGATIVE mg/dL
Leukocytes,Ua: NEGATIVE
Nitrite: NEGATIVE
Protein, ur: NEGATIVE mg/dL
Specific Gravity, Urine: 1.01 (ref 1.005–1.030)
pH: 8 (ref 5.0–8.0)

## 2023-03-06 LAB — CREATININE, URINE, RANDOM: Creatinine, Urine: 40 mg/dL

## 2023-03-06 LAB — SODIUM, URINE, RANDOM: Sodium, Ur: 89 mmol/L

## 2023-03-06 LAB — RETICULOCYTES
Immature Retic Fract: 10.4 % (ref 2.3–15.9)
RBC.: 2.79 MIL/uL — ABNORMAL LOW (ref 4.22–5.81)
Retic Count, Absolute: 39.9 10*3/uL (ref 19.0–186.0)
Retic Ct Pct: 1.4 % (ref 0.4–3.1)

## 2023-03-06 LAB — HEMOGLOBIN AND HEMATOCRIT, BLOOD
HCT: 23.7 % — ABNORMAL LOW (ref 39.0–52.0)
Hemoglobin: 8.4 g/dL — ABNORMAL LOW (ref 13.0–17.0)

## 2023-03-06 MED ORDER — SODIUM CHLORIDE 0.9 % IV SOLN: INTRAVENOUS | Status: DC

## 2023-03-06 MED ORDER — SODIUM CHLORIDE 0.9% IV SOLUTION: Freq: Once | INTRAVENOUS | Status: AC

## 2023-03-06 MED FILL — Midazolam HCl Inj 5 MG/5ML (Base Equivalent): INTRAMUSCULAR | Qty: 2 | Status: AC

## 2023-03-06 NOTE — Progress Notes (Addendum)
  Progress Note    03/06/2023 8:09 AM 1 Day Post-Op  Subjective:  patient tearful   Vitals:   03/05/23 2040 03/06/23 0037  BP: (!) 129/90 105/62  Pulse: (!) 103 95  Resp:  17  Temp:  98.1 F (36.7 C)  SpO2:  98%   Physical Exam: Cardiac:  tachy Lungs:  non labored Extremities:  left common femoral access site is clean, dry and intact without swelling or hematoma. Right leg dressed. Tissue loss and ischemic changes to the right toes Neurologic: alert and oriented  CBC    Component Value Date/Time   WBC 14.4 (H) 03/06/2023 0139   RBC 2.01 (L) 03/06/2023 0139   HGB 5.9 (LL) 03/06/2023 0139   HCT 17.0 (L) 03/06/2023 0139   PLT 490 (H) 03/06/2023 0139   MCV 84.6 03/06/2023 0139   MCH 29.4 03/06/2023 0139   MCHC 34.7 03/06/2023 0139   RDW 17.2 (H) 03/06/2023 0139    BMET    Component Value Date/Time   NA 133 (L) 03/06/2023 0139   K 4.7 03/06/2023 0139   CL 99 03/06/2023 0139   CO2 23 03/06/2023 0139   GLUCOSE 147 (H) 03/06/2023 0139   BUN 113 (H) 03/06/2023 0139   CREATININE 3.56 (H) 03/06/2023 0139   CALCIUM 8.5 (L) 03/06/2023 0139   GFRNONAA 17 (L) 03/06/2023 0139   GFRAA (L) 03/18/2010 0457    60        The eGFR has been calculated using the MDRD equation. This calculation has not been validated in all clinical situations. eGFR's persistently <60 mL/min signify possible Chronic Kidney Disease.    INR    Component Value Date/Time   INR 1.0 12/06/2022 1040     Intake/Output Summary (Last 24 hours) at 03/06/2023 0809 Last data filed at 03/06/2023 0600 Gross per 24 hour  Intake 480 ml  Output 1755 ml  Net -1275 ml     Assessment/Plan:  72 y.o. male is s/p Aortogram, Arteriogram RLE 1 Day Post-Op   SFA occluded with no tibial runoff in setting of right foot gangrene Unfortunately no revascularization options for his RLE Hgb 5.9 this morning. Receiving 2 Units PRBC. No signs of active bleeding Recommendation is for right above knee amputation  once medically stable Patient trying to process this and wants to also talk with his sister before making any decision   Marval Regal [facility] [phone] 03/06/2023 8:09 AM  I have independently interviewed and examined patient and agree with PA assessment and plan above.  Again had discussion that patient will require above-knee amputation when medically stable and he wants to have further discussion with his sisters.  I have advised against discharge home as he arrived this time with acute kidney injury and severely anemic.  Likely AKA later this week.  Adelia Baptista C. Donzetta Matters, MD [facility] [place] Office: [phone] Pager: [phone]

## 2023-03-06 NOTE — Progress Notes (Signed)
Hgb is down to 5.9. Vitals appear stable, no overt bleeding. Plan to transfuse 2 units RBC.

## 2023-03-06 NOTE — Progress Notes (Signed)
Date and time results received: 03/06/23 2:25  Critical Value: Hgb 5.9  Mitzi Hansen, MD notified via page of critical value & existing type & screen performed 03/05/23, call back from Opyd, MD & order received to transfuse 2u RBC.   When attempting to get consent signed pt stated he needs to think about it. Melissa Brogdon in [place], MD notified of reason for delay via secure chat.

## 2023-03-06 NOTE — Progress Notes (Signed)
[facility]                                                                               [name], is a 72 y.o. male, DOB - 1951/05/25, [id number] Admit date - 03/05/2023    Outpatient Primary MD for the patient is Iona Beard, MD  LOS - 1  days    Brief summary   [name] is a 72 y.o. male with medical history significant of hypertension, hyperlipidemia, PAD s/p nephrolithiasis, prostate cancer s/p radiation, and GERD who presents with complaints of right leg pain.  Patient had undergone right common femoral posterior tibial arterial bypass on 12/12/2022 with Dr. Donzetta Matters for nonhealing wounds of the right lower extremity.   He had also been being followed by wound clinic for wounds of the right lower extremity and wound VAC In place to that extremity.  he had been noted to have thrombosis of the graft on visit from 2/21 where ABI on the right was noted to be 0.    Plan was for attempted revascularization, but ultimately thought patient would be amputation.  Patient notes that he has been urinating, but feels like he is retaining at times.  He has been using pads as he reports that he has had urinary incontinence ever since receiving radiation therapy.  Vascular surgery consulted and he is to be scheduled for amputation later this week.    Assessment & Plan    Assessment and Plan:  Critical ischemia of the right leg: S/p arteriogram using CO2 , unsuccessful as it appears that SFA occluded with no tibial runoff. Next step is for right AKA once medically stable.  Vascular waiting for patient to consent to the procedure to go ahead.  Pain control.    AKI superimposed on stage CKD IIIa with metabolic acidosis:  Bicarb normalized.  Baseline creatinine around 1.3. admitted with a creatinine of 5.1 .  US renal showed  Moderate hydronephrosis on the right and mild hydronephrosis on the left.and patient does complaint of urinary retention. Foley catheter  placement.  Monitor urine output.  Check for infection, with UA and urine cultures.   Creatinine appears to be improving. Its 3. 56 today.  Continue to monitor.   Hyperkalemia Resolved.     Normocytic anemia.  Anemia of blood loss ? Vs anemia of acute illness Hemoglobin at baseline around 7 which has dropped to 5.9 this morning. ? Hemodilution.  2 units of prbc transfusion ordered and plan to check post transfusion H&H later this evening.    Hypertension Well controlled.  Currently on metoprolol     Hyperlipidemia:  Resume statin.    H/o Prostate ca:  S/p radiation therapy.    Anxiety.  On ativan prn.   Leukocytosis: ? Reactive, continue to monitor. Remains afebrile.    Thrombocytosis:  Continue to monitor.    Mildly elevated liver enzymes:  Follow. He currently denies any nausea, vomiting or abdominal pain.    Mild hyponatremia:  Monitor.      Estimated body mass index is 29.84 kg/m as calculated from the following:   Height as of this encounter: 6' (1.829 m).   Weight as  of this encounter: 99.8 kg.  Code Status:  full code.  DVT Prophylaxis:  heparin injection 5,000 Units Start: 03/05/23 2200 SCDs Start: 03/05/23 1136   Level of Care: Level of care: Telemetry Medical Family Communication: family at bedside.   Disposition Plan:     Remains inpatient appropriate:  anemia.   Procedures:  None.   Consultants:   Vascular surgery.   Antimicrobials:   Anti-infectives (From admission, onward)    None        Medications  Scheduled Meds:  sodium chloride   Intravenous Once   atorvastatin  40 mg Oral q morning   heparin  5,000 Units Subcutaneous Q8H   metoprolol tartrate  12.5 mg Oral BID   pantoprazole  40 mg Oral Daily   sodium chloride flush  3 mL Intravenous Q12H   sodium chloride flush  3 mL Intravenous Q12H   Continuous Infusions:  sodium chloride     sodium bicarbonate 150 mEq in sterile water 1,150 mL infusion 125 mL/hr at  03/06/23 0739   PRN Meds:.sodium chloride, acetaminophen **OR** acetaminophen, albuterol, hydrALAZINE, labetalol, LORazepam, ondansetron (ZOFRAN) IV, oxyCODONE, sodium chloride flush    Subjective:   [name] was seen and examined today.  No new complaints.  Objective:   Vitals:   03/05/23 2040 03/06/23 0037 03/06/23 0939 03/06/23 1030  BP: (!) 129/90 105/62 (!) 101/52 (!) 107/54  Pulse: (!) 103 95 95 89  Resp:  17 18   Temp:  98.1 F (36.7 C) 98.1 F (36.7 C)   TempSrc:  Oral Oral   SpO2:  98% 98% 97%  Weight:      Height:        Intake/Output Summary (Last 24 hours) at 03/06/2023 1155 Last data filed at 03/06/2023 0600 Gross per 24 hour  Intake 480 ml  Output 1755 ml  Net -1275 ml   Filed Weights   03/05/23 0942  Weight: 99.8 kg     Exam General exam: pale looking elderly gentleman, not in distress.  Respiratory system: Clear to auscultation. Respiratory effort normal. Cardiovascular system: S1 & S2 heard, RRR. No JVD,  Gastrointestinal system: Abdomen is nondistended, soft and nontender.  Central nervous system: Alert and oriented. Grossly non focal.  Extremities: right leg bandaged.  Skin: No rashes,  Psychiatry:  Mood & affect appropriate.     Data Reviewed:  I have personally reviewed following labs and imaging studies   CBC Lab Results  Component Value Date   WBC 14.4 (H) 03/06/2023   RBC 2.01 (L) 03/06/2023   HGB 5.9 (LL) 03/06/2023   HCT 17.0 (L) 03/06/2023   MCV 84.6 03/06/2023   MCH 29.4 03/06/2023   PLT 490 (H) 03/06/2023   MCHC 34.7 03/06/2023   RDW 17.2 (H) [id number]     Last metabolic panel Lab Results  Component Value Date   NA 133 (L) 03/06/2023   K 4.7 03/06/2023   CL 99 03/06/2023   CO2 23 03/06/2023   BUN 113 (H) 03/06/2023   CREATININE 3.56 (H) 03/06/2023   GLUCOSE 147 (H) 03/06/2023   GFRNONAA 17 (L) 03/06/2023   GFRAA (L) 03/18/2010    60        The eGFR has been calculated using the MDRD equation. This  calculation has not been validated in all clinical situations. eGFR's persistently <60 mL/min signify possible Chronic Kidney Disease.   CALCIUM 8.5 (L) 03/06/2023   PROT 7.0 03/05/2023   ALBUMIN 2.4 (L) 03/05/2023   BILITOT 0.5 03/05/2023  ALKPHOS 104 03/05/2023   AST 64 (H) 03/05/2023   ALT 93 (H) 03/05/2023   ANIONGAP 11 03/06/2023    CBG (last 3)  No results for input(s): "GLUCAP" in the last 72 hours.    Coagulation Profile: No results for input(s): "INR", "PROTIME" in the last 168 hours.   Radiology Studies: US RENAL  Result Date: 03/06/2023 CLINICAL DATA:  AKI. EXAM: RENAL / URINARY TRACT ULTRASOUND COMPLETE COMPARISON:  03/05/2019. FINDINGS: Right Kidney: Renal measurements: 12.9 x 6.5 x 6.2 cm = volume: 271 mL. Echogenicity within normal limits. Cysts are noted in the right kidney measuring 5.5 x 3.5 x 4.7 cm and 4.3 x 4.4 x 4.5 cm. There is moderate hydronephrosis. Left Kidney: Renal measurements: 10.1 x 6.0 x 6.0 cm = volume: 190 mL. Echogenicity within normal limits. A cyst is present in the lower pole measuring 3.7 x 3.1 x 2.8 cm. Mild hydronephrosis is noted. Bladder: Appears normal for degree of bladder distention. Ureteral jets are not seen. Prevoid bladder volume is 321 mL. Postvoid bladder volume could not be obtained. Other: None. IMPRESSION: 1. Moderate hydronephrosis on the right and mild hydronephrosis on the left. 2. Bilateral renal cysts. Electronically Signed   By: Brett Fairy M.D.   On: 03/06/2023 03:36   PERIPHERAL VASCULAR CATHETERIZATION  Result Date: 03/05/2023 Images from the original result were not included. Patient name: [name] MRN: [id number] DOB: [birth year] Sex: male 03/05/2023 Pre-operative Diagnosis: Chronic right lower extremity limb threatening ischemia with toe and foot gangrene Post-operative diagnosis:  Same Surgeon:  Eda Paschal. Donzetta Matters, MD Procedure Performed: 1.  Ultrasound-guided cannulation left common femoral artery 2.   Selection of right common femoral artery.  Right lower extremity CO2 angiography 3.  Ultrasound-guided cannulation right anterior tibial and posterior tibial arteries 4.  Moderate sedation with fentanyl and Versed for 36 minutes 5.  Mynx device closure left common femoral artery Indications: 72 year old male with a history of a right common femoral endarterectomy and femoral to posterior tibial artery bypass.  He has extensive wounds of the right lower extremity he has been counseled on the need for amputation but would like 1 last attempt at angiography with possible.  He now presents for angiography of the right lower extremity and his creatinine was severely elevated from baseline we elected to use CO2 and no contrast. Findings: There is a very strong common femoral pulse.  The common femoral artery was patulous consistent with previous endarterectomy there does not appear to be runoff into the SFA.  Distally in the leg we can only visualize a collateral below the knee.  I attempted to cannulate both the anterior tibial and posterior tibial arteries but these were chronically thrombosed despite getting needles and the blood return there was no ability to pass a wire. Patient will be scheduled for right above-knee amputation when he is medically stable.  Procedure:  The patient was identified in the holding area and taken to room 2.  The patient was then placed supine on the table and prepped and draped in the usual sterile fashion.  A time out was called.  Ultrasound was used to evaluate the left common femoral artery which is patent and compressible.  The area was anesthetized 1% lidocaine cannulated by functional followed by wire and sheath.  And images saved the permanent record.  Concomitantly we administered fentanyl and Versed for moderate sedation and his bedside vitals were monitored by nursing.  We placed a Bentson wire followed by a 5 [place] sheath.  We crossed the bifurcation with Omni catheter elected  not to perform aortogram given that this was just performed 4 months ago.  Will perform CO2 angiography of the right lower extremity there was really no runoff identified.  With this I elected to prepped out the right foot.  I then used ultrasound guidance to cannulate the posterior tibial and anterior tibial arteries but there was no backbleeding and a wire was passed.  Given the extensive gangrene patient will need amputation above the knee on the right.  We remove the wire and then applied a minx device to the left common femoral artery.  Patient did tolerate procedure without any complication. Contrast: 36cc Brandon C. Donzetta Matters, MD [facility] Office: [phone] Pager: [phone]       Hosie Poisson M.D. [facility] 03/06/2023, 11:55 AM  Available via Epic secure chat 7am-7pm After 7 pm, please refer to night coverage provider listed on amion.

## 2023-03-07 DIAGNOSIS — D649 Anemia, unspecified: Secondary | ICD-10-CM | POA: Diagnosis not present

## 2023-03-07 DIAGNOSIS — Z8546 Personal history of malignant neoplasm of prostate: Secondary | ICD-10-CM | POA: Diagnosis not present

## 2023-03-07 DIAGNOSIS — I70221 Atherosclerosis of native arteries of extremities with rest pain, right leg: Secondary | ICD-10-CM | POA: Diagnosis not present

## 2023-03-07 DIAGNOSIS — I10 Essential (primary) hypertension: Secondary | ICD-10-CM | POA: Diagnosis not present

## 2023-03-07 LAB — BASIC METABOLIC PANEL
Anion gap: 13 (ref 5–15)
BUN: 77 mg/dL — ABNORMAL HIGH (ref 8–23)
CO2: 23 mmol/L (ref 22–32)
Calcium: 8.5 mg/dL — ABNORMAL LOW (ref 8.9–10.3)
Chloride: 99 mmol/L (ref 98–111)
Creatinine, Ser: 3.07 mg/dL — ABNORMAL HIGH (ref 0.61–1.24)
GFR, Estimated: 21 mL/min — ABNORMAL LOW (ref >60)
Glucose, Bld: 117 mg/dL — ABNORMAL HIGH (ref 70–99)
Potassium: 4.5 mmol/L (ref 3.5–5.1)
Sodium: 135 mmol/L (ref 135–145)

## 2023-03-07 LAB — CBC WITH DIFFERENTIAL/PLATELET
Abs Immature Granulocytes: 0.23 10*3/uL — ABNORMAL HIGH (ref 0.00–0.07)
Basophils Absolute: 0.1 10*3/uL (ref 0.0–0.1)
Basophils Relative: 0 %
Eosinophils Absolute: 0.2 10*3/uL (ref 0.0–0.5)
Eosinophils Relative: 2 %
HCT: 23.9 % — ABNORMAL LOW (ref 39.0–52.0)
Hemoglobin: 8.2 g/dL — ABNORMAL LOW (ref 13.0–17.0)
Immature Granulocytes: 1 %
Lymphocytes Relative: 10 %
Lymphs Abs: 1.6 10*3/uL (ref 0.7–4.0)
MCH: 28.9 pg (ref 26.0–34.0)
MCHC: 34.3 g/dL (ref 30.0–36.0)
MCV: 84.2 fL (ref 80.0–100.0)
Monocytes Absolute: 1.2 10*3/uL — ABNORMAL HIGH (ref 0.1–1.0)
Monocytes Relative: 7 %
Neutro Abs: 12.8 10*3/uL — ABNORMAL HIGH (ref 1.7–7.7)
Neutrophils Relative %: 80 %
Platelets: 448 10*3/uL — ABNORMAL HIGH (ref 150–400)
RBC: 2.84 MIL/uL — ABNORMAL LOW (ref 4.22–5.81)
RDW: 16.3 % — ABNORMAL HIGH (ref 11.5–15.5)
WBC: 16 10*3/uL — ABNORMAL HIGH (ref 4.0–10.5)
nRBC: 0 % (ref 0.0–0.2)

## 2023-03-07 LAB — IRON AND TIBC
Iron: 79 ug/dL (ref 45–182)
Saturation Ratios: 46 % — ABNORMAL HIGH (ref 17.9–39.5)
TIBC: 172 ug/dL — ABNORMAL LOW (ref 250–450)
UIBC: 93 ug/dL

## 2023-03-07 LAB — TYPE AND SCREEN
ABO/RH(D): O POS
Antibody Screen: NEGATIVE
Unit division: 0
Unit division: 0

## 2023-03-07 LAB — BPAM RBC
Blood Product Expiration Date: 202403182359
Blood Product Expiration Date: 202403182359
ISSUE DATE / TIME: 202403121231
ISSUE DATE / TIME: 202403121658
Unit Type and Rh: 5100
Unit Type and Rh: 5100

## 2023-03-07 LAB — UREA NITROGEN, URINE: Urea Nitrogen, Ur: 545 mg/dL

## 2023-03-07 LAB — FERRITIN: Ferritin: 621 ng/mL — ABNORMAL HIGH (ref 24–336)

## 2023-03-07 LAB — FOLATE: Folate: 32.5 ng/mL (ref >5.9)

## 2023-03-07 LAB — VITAMIN B12: Vitamin B-12: 954 pg/mL — ABNORMAL HIGH (ref 180–914)

## 2023-03-07 MED ORDER — CHLORHEXIDINE GLUCONATE CLOTH 2 % EX PADS
6.0000 | MEDICATED_PAD | Freq: Every day | CUTANEOUS | Status: DC
  Administered 2023-03-07 – 2023-03-10 (×4): 6 via TOPICAL

## 2023-03-07 NOTE — Progress Notes (Signed)
PROGRESS NOTE  [name] [id number] DOB: Nov 04, 1951   PCP: Iona Beard, MD  Patient is from: Home  DOA: 03/05/2023 LOS: 2  Chief complaints No chief complaint on file.    Brief Narrative / Interim history: 72 year old M with PMH of PAD/nonhealing RLE wound s/p right CFA and PTA bypass in 11/2022, prostate cancer s/p rad, HTN, HLD, GERD and CKD-3A presenting with RLE pain.  Patient has been followed by vascular surgery and wound clinic for RLE wound and wound VAC.  He was found to have thrombosis of the graft on visit from 2/21 where ABI on the right was noted to be 0.  Evaluated by vascular surgery.  SFA occlusion with no tibial runoff on catheterization on 3/11.  Unfortunately, no revascularization option for his RLE.  Vascular surgery recommended AKA.  Patient and family undecided.  Hospital course complicated by anemia requiring blood transfusion   Subjective: Seen and examined earlier this morning.  No major events overnight of this morning.  Patient is emotional and tearful about his leg and surgery.  He likes his sister involved in decision making.  Seems to be overwhelmed.  Objective: Vitals:   03/07/23 0005 03/07/23 0503 03/07/23 0756 03/07/23 0841  BP: (!) 123/53 (!) 101/51 120/68   Pulse: 87 84 76 86  Resp: '18 16 18   '$ Temp: 99.4 F (37.4 C) 99.4 F (37.4 C) 98.5 F (36.9 C)   TempSrc: Oral Oral Oral   SpO2: 96% 96% 98%   Weight:      Height:        Examination:  GENERAL: No apparent distress.  Nontoxic. HEENT: MMM.  Vision and hearing grossly intact.  NECK: Supple.  No apparent JVD.  RESP:  No IWOB.  Fair aeration bilaterally. CVS:  RRR. Heart sounds normal.  ABD/GI/GU: BS+. Abd soft, NTND.  MSK/EXT:  Moves extremities.  RLE gangrene.  Cyanotic right foot.  Bulla on his right toes.  Very cold to touch. SKIN: As above. NEURO: Awake, alert and oriented appropriately.  No apparent focal neuro deficit. PSYCH: Calm. Normal affect.   Procedures:   None  Microbiology summarized: None  Assessment and plan: Principal Problem:   Critical limb ischemia of right lower extremity (HCC) Active Problems:   Acute kidney injury superimposed on chronic kidney disease (HCC)   Metabolic acidosis, increased anion gap   Hyperkalemia   Normocytic anemia   Essential hypertension   History of prostate cancer  Critical ischemia of the right leg: s/p arteriogram that showed SFA occlusion with no tibial runoff. -Vascular surgery recommends right AKA pending consent by patient and sister.  AKI on CKD-3A: Improving.  Renal US with moderate right and mild left hydronephrosis Recent Labs    11/20/22 1029 12/06/22 1040 12/13/22 0410 12/14/22 0126 03/05/23 1008 03/05/23 1031 03/05/23 1038 03/05/23 1846 03/06/23 0139 03/07/23 0142  BUN 25* 27* 28* 33* 128* 127* 136* 122* 113* 77*  CREATININE 1.30* 1.49* 1.36* 1.43* 5.10* 5.10* 4.42* 4.00* 3.56* [id number]*  -Continue folic acid -Continue monitoring -Avoid nephrotoxic meds  Normocytic anemia: Hgb reached nadir at 5.9 on 3/12.  Unclear cause of this.  No overt bleeding.  Transfused 2 units with appropriate response.  Anemia panel suggests anemia of chronic disease Recent Labs    12/12/22 1245 12/12/22 1326 12/13/22 0410 12/14/22 0126 03/05/23 1008 03/05/23 1031 03/05/23 1846 03/06/23 0139 03/06/23 2335 03/07/23 0142  HGB 12.9* 8.8* 7.9* 8.0* 7.8* 7.1* 7.0* 5.9* 8.4* 8.2*  -Continue monitoring  Hyperkalemia/metabolic acidosis: Resolved  Essential hypertension: Normotensive -Continue metoprolol   Leukocytosis/bandemia: Reactive? -Continue monitoring   Thrombocytosis: Improved.  Likely reactive. Continue to monitor.    Mildly elevated liver enzymes:  -Monitor   Hyperlipidemia:  -Started   H/o Prostate ca: S/p radiation therapy.    Anxiety:  -On ativan prn.    Mild hyponatremia:     Body mass index is 29.84 kg/m.          DVT prophylaxis:  heparin injection 5,000  Units Start: 03/05/23 2200 SCDs Start: 03/05/23 1136  Code Status: Full code Family Communication: None at bedside Level of care: Telemetry Medical Status is: Inpatient Remains inpatient appropriate because: Critical limb ischemia   Final disposition: TBD Consultants:  Vascular surgery  55 minutes with more than 50% spent in reviewing records, counseling patient/family and coordinating care.   Sch Meds:  Scheduled Meds:  atorvastatin  40 mg Oral q morning   Chlorhexidine Gluconate Cloth  6 each Topical Daily   heparin  5,000 Units Subcutaneous Q8H   metoprolol tartrate  12.5 mg Oral BID   pantoprazole  40 mg Oral Daily   sodium chloride flush  3 mL Intravenous Q12H   sodium chloride flush  3 mL Intravenous Q12H   Continuous Infusions:  sodium chloride Stopped (03/06/23 1725)   sodium chloride 100 mL/hr at 03/06/23 1730   PRN Meds:.sodium chloride, acetaminophen **OR** acetaminophen, albuterol, hydrALAZINE, labetalol, LORazepam, ondansetron (ZOFRAN) IV, oxyCODONE, sodium chloride flush  Antimicrobials: Anti-infectives (From admission, onward)    None        I have personally reviewed the following labs and images: CBC: Recent Labs  Lab 03/05/23 1031 03/05/23 1846 03/06/23 0139 03/06/23 2335 03/07/23 0142  WBC  --  16.6* 14.4*  --  16.0*  NEUTROABS  --   --   --   --  12.8*  HGB 7.1* 7.0* 5.9* 8.4* 8.2*  HCT 21.0* 20.6* 17.0* 23.7* 23.9*  MCV  --  83.7 84.6  --  84.2  PLT  --  590* 490*  --  448*   BMP &GFR Recent Labs  Lab 03/05/23 1031 03/05/23 1038 03/05/23 1846 03/06/23 0139 03/07/23 0142  NA 133* 134* 135 133* 135  K 6.0* 6.1* 4.9 4.7 4.5  CL 106 104 101 99 99  CO2  --  13* 20* 23 23  GLUCOSE 150* 149* 130* 147* 117*  BUN 127* 136* 122* 113* 77*  CREATININE 5.10* 4.42* 4.00* 3.56* 3.07*  CALCIUM  --  9.4 9.1 8.5* 8.5*   Estimated Creatinine Clearance: 26.2 mL/min (A) (by C-G formula based on SCr of 3.07 mg/dL (H)). Liver &  Pancreas: Recent Labs  Lab 03/05/23 1846  AST 64*  ALT 93*  ALKPHOS 104  BILITOT 0.5  PROT 7.0  ALBUMIN 2.4*   No results for input(s): "LIPASE", "AMYLASE" in the last 168 hours. No results for input(s): "AMMONIA" in the last 168 hours. Diabetic: No results for input(s): "HGBA1C" in the last 72 hours. No results for input(s): "GLUCAP" in the last 168 hours. Cardiac Enzymes: No results for input(s): "CKTOTAL", "CKMB", "CKMBINDEX", "TROPONINI" in the last 168 hours. No results for input(s): "PROBNP" in the last 8760 hours. Coagulation Profile: No results for input(s): "INR", "PROTIME" in the last 168 hours. Thyroid Function Tests: No results for input(s): "TSH", "T4TOTAL", "FREET4", "T3FREE", "THYROIDAB" in the last 72 hours. Lipid Profile: No results for input(s): "CHOL", "HDL", "LDLCALC", "TRIG", "CHOLHDL", "LDLDIRECT" in the last 72 hours. Anemia Panel: Recent Labs    03/06/23 2335  VITAMINB12 954*  FOLATE 32.5  FERRITIN 621*  TIBC 172*  IRON 79  RETICCTPCT 1.4   Urine analysis:    Component Value Date/Time   COLORURINE YELLOW 03/06/2023 1420   APPEARANCEUR CLEAR 03/06/2023 1420   LABSPEC 1.010 03/06/2023 1420   PHURINE 8.0 03/06/2023 1420   GLUCOSEU NEGATIVE 03/06/2023 1420   HGBUR SMALL (A) 03/06/2023 1420   BILIRUBINUR NEGATIVE 03/06/2023 1420   KETONESUR NEGATIVE 03/06/2023 1420   PROTEINUR NEGATIVE 03/06/2023 1420   NITRITE NEGATIVE 03/06/2023 1420   LEUKOCYTESUR NEGATIVE 03/06/2023 1420   Sepsis Labs: Invalid input(s): "PROCALCITONIN", "LACTICIDVEN"  Microbiology: No results found for this or any previous visit (from the past 240 hour(s)).  Radiology Studies: No results found.    Laya Letendre T. [place]  If 7PM-7AM, please contact night-coverage [email or web address] 03/07/2023, 12:52 PM

## 2023-03-07 NOTE — Plan of Care (Signed)
  Problem: Cardiovascular: Goal: Ability to achieve and maintain adequate cardiovascular perfusion will improve Outcome: Progressing Goal: Vascular access site(s) Level 0-1 will be maintained Outcome: Progressing   Problem: Education: Goal: Knowledge of General Education information will improve Description: Including pain rating scale, medication(s)/side effects and non-pharmacologic comfort measures Outcome: Progressing   Problem: Health Behavior/Discharge Planning: Goal: Ability to manage health-related needs will improve Outcome: Progressing   Problem: Clinical Measurements: Goal: Cardiovascular complication will be avoided Outcome: Progressing   Problem: Activity: Goal: Risk for activity intolerance will decrease Outcome: Progressing   Problem: Pain Managment: Goal: General experience of comfort will improve Outcome: Progressing   Problem: Safety: Goal: Ability to remain free from injury will improve Outcome: Progressing   Problem: Skin Integrity: Goal: Risk for impaired skin integrity will decrease Outcome: Progressing

## 2023-03-07 NOTE — Progress Notes (Signed)
3/12 22:00 & 3/13 06:00 doses of SQ Heparin held d/t Hgb & possible unidentified bleeding. On call [facility] provider & dayshift RN Lanell Persons updated

## 2023-03-07 NOTE — Progress Notes (Addendum)
  Progress Note    03/07/2023 10:26 AM 2 Days Post-Op  Subjective:  no major complaints   Vitals:   03/07/23 0756 03/07/23 0841  BP: 120/68   Pulse: 76 86  Resp: 18   Temp: 98.5 F (36.9 C)   SpO2: 98%    Physical Exam: Cardiac:  regular Lungs:  non labored Extremities:  left femoral access site without swelling or hematoma. Right leg dressed Neurologic: alert and oriented  CBC    Component Value Date/Time   WBC 16.0 (H) 03/07/2023 0142   RBC 2.84 (L) 03/07/2023 0142   RBC 2.79 (L) 03/06/2023 2335   HGB 8.2 (L) 03/07/2023 0142   HCT 23.9 (L) 03/07/2023 0142   PLT 448 (H) 03/07/2023 0142   MCV 84.2 03/07/2023 0142   MCH 28.9 03/07/2023 0142   MCHC 34.3 03/07/2023 0142   RDW 16.3 (H) 03/07/2023 0142   LYMPHSABS 1.6 03/07/2023 0142   MONOABS 1.2 (H) 03/07/2023 0142   EOSABS 0.2 03/07/2023 0142   BASOSABS 0.1 03/07/2023 0142    BMET    Component Value Date/Time   NA 135 03/07/2023 0142   K 4.5 03/07/2023 0142   CL 99 03/07/2023 0142   CO2 23 03/07/2023 0142   GLUCOSE 117 (H) 03/07/2023 0142   BUN 77 (H) 03/07/2023 0142   CREATININE 3.07 (H) 03/07/2023 0142   CALCIUM 8.5 (L) 03/07/2023 0142   GFRNONAA 21 (L) 03/07/2023 0142   GFRAA (L) 03/18/2010 0457    60        The eGFR has been calculated using the MDRD equation. This calculation has not been validated in all clinical situations. eGFR's persistently <60 mL/min signify possible Chronic Kidney Disease.    INR    Component Value Date/Time   INR 1.0 12/06/2022 1040     Intake/Output Summary (Last 24 hours) at 03/07/2023 1026 Last data filed at 03/07/2023 0504 Gross per 24 hour  Intake 1972.76 ml  Output 2775 ml  Net -802.24 ml     Assessment/Plan:  72 y.o. male is s/p Angiogram RLE 2 Days Post-Op   SFA occluded with no tibial runoff in setting of right foot gangrene Unfortunately no revascularization options for his RLE Hgb 8.2 this morning after receiving 2 Units PRBC. No signs of  active bleeding Recommendation is for right above knee amputation AKA offered for later this week however patient says he is still processing and not sure he can make a decision in the next two days. Also would like for his sister to discuss the surgery with Dr. Jeri Lager, PA-C [facility] [phone] 03/07/2023 10:26 AM  I have independently interviewed and examined patient and agree with PA assessment and plan above. Likely Right AKA Friday pending patient discussion with family.  Jaelie Aguilera C. Donzetta Matters, MD [facility] [place] Office: [phone] Pager: [phone]

## 2023-03-08 ENCOUNTER — Ambulatory Visit (HOSPITAL_BASED_OUTPATIENT_CLINIC_OR_DEPARTMENT_OTHER): Payer: Medicare Other | Admitting: Internal Medicine

## 2023-03-08 DIAGNOSIS — Z8546 Personal history of malignant neoplasm of prostate: Secondary | ICD-10-CM | POA: Diagnosis not present

## 2023-03-08 DIAGNOSIS — I10 Essential (primary) hypertension: Secondary | ICD-10-CM | POA: Diagnosis not present

## 2023-03-08 DIAGNOSIS — I70221 Atherosclerosis of native arteries of extremities with rest pain, right leg: Secondary | ICD-10-CM | POA: Diagnosis not present

## 2023-03-08 DIAGNOSIS — D649 Anemia, unspecified: Secondary | ICD-10-CM | POA: Diagnosis not present

## 2023-03-08 LAB — RENAL FUNCTION PANEL
Albumin: 2 g/dL — ABNORMAL LOW (ref 3.5–5.0)
Anion gap: 9 (ref 5–15)
BUN: 50 mg/dL — ABNORMAL HIGH (ref 8–23)
CO2: 22 mmol/L (ref 22–32)
Calcium: 8.8 mg/dL — ABNORMAL LOW (ref 8.9–10.3)
Chloride: 104 mmol/L (ref 98–111)
Creatinine, Ser: 2.9 mg/dL — ABNORMAL HIGH (ref 0.61–1.24)
GFR, Estimated: 22 mL/min — ABNORMAL LOW (ref >60)
Glucose, Bld: 114 mg/dL — ABNORMAL HIGH (ref 70–99)
Phosphorus: 3.5 mg/dL (ref 2.5–4.6)
Potassium: 4.5 mmol/L (ref 3.5–5.1)
Sodium: 135 mmol/L (ref 135–145)

## 2023-03-08 LAB — CBC
HCT: 25 % — ABNORMAL LOW (ref 39.0–52.0)
Hemoglobin: 8.5 g/dL — ABNORMAL LOW (ref 13.0–17.0)
MCH: 29.2 pg (ref 26.0–34.0)
MCHC: 34 g/dL (ref 30.0–36.0)
MCV: 85.9 fL (ref 80.0–100.0)
Platelets: 459 10*3/uL — ABNORMAL HIGH (ref 150–400)
RBC: 2.91 MIL/uL — ABNORMAL LOW (ref 4.22–5.81)
RDW: 16.3 % — ABNORMAL HIGH (ref 11.5–15.5)
WBC: 17.7 10*3/uL — ABNORMAL HIGH (ref 4.0–10.5)
nRBC: 0 % (ref 0.0–0.2)

## 2023-03-08 LAB — MAGNESIUM: Magnesium: 2 mg/dL (ref 1.7–2.4)

## 2023-03-08 NOTE — TOC Initial Note (Signed)
Transition of Care [facility]) - Initial/Assessment Note    Patient Details  Name: [name] MRN: [id number] Date of Birth: [birth year]  Transition of Care [facility]) CM/SW Contact:    Bethena Roys, RN Phone Number: 03/08/2023, 4:42 PM  Clinical Narrative: Patient presented for critical limb ischemia of the right lower extremity. S/p arteriogram-patient has now consented to AKA on 03-09-23. Patient has DME rolling walker and bedside commode in the home from [place]. Case Manager placed a call to sister Lelon Frohlich regarding disposition needs as the patient progresses. Case Manager will continue to follow for transition of care needs.                   Expected Discharge Plan: Skilled Nursing Facility Barriers to Discharge: Continued Medical Work up   Patient Goals and CMS Choice Patient states their goals for this hospitalization and ongoing recovery are:: Plan for AKA- work with rehab  Expected Discharge Plan and Services In-house Referral: NA Discharge Planning Services: CM Consult Post Acute Care Choice: [place] Living arrangements for the past 2 months: Single Family Home                   DME Agency: NA  Prior Living Arrangements/Services Living arrangements for the past 2 months: Single Family Home Lives with:: Self (has support of sister.) Patient language and need for interpreter reviewed:: Yes        Need for Family Participation in Patient Care: Yes (Comment) Care giver support system in place?: Yes (comment) Current home services: DME (rolling walker and bedside commode.) Criminal Activity/Legal Involvement Pertinent to Current Situation/Hospitalization: No - Comment as needed  Permission Sought/Granted Permission sought to share information with : Family Supports, Case Manager   Emotional Assessment Appearance:: Appears stated age     Orientation: : Oriented to Self Alcohol / Substance Use: Not Applicable Psych Involvement: No  (comment)  Admission diagnosis:  Critical limb ischemia of right lower extremity ([place]) [I70.221] Patient Active Problem List   Diagnosis Date Noted   Acute kidney injury superimposed on chronic kidney disease ([place]) 03/05/2023   Hyperkalemia 03/05/2023   Normocytic anemia 03/05/2023   History of prostate cancer 03/05/2023   Essential hypertension 0000000   Metabolic acidosis, increased anion gap 03/05/2023   Critical limb ischemia of right lower extremity ([place]) 12/12/2022   Malignant neoplasm of prostate ([place]) 03/18/2019   PCP:  Iona Beard, MD Pharmacy:   [place], [place] - [address] [place] [address] Phone: [phone] Fax: [phone]  Social Determinants of Health (SDOH) Social History: SDOH Screenings   Food Insecurity: No Food Insecurity (12/12/2022)  Housing: Low Risk  (12/12/2022)  Transportation Needs: No Transportation Needs (12/12/2022)  Utilities: Not At Risk (12/12/2022)  Tobacco Use: Medium Risk (03/06/2023)   Readmission Risk Interventions     No data to display

## 2023-03-08 NOTE — Progress Notes (Signed)
PROGRESS NOTE  [name] [id number] DOB: 01-17-1951   PCP: Iona Beard, MD  Patient is from: Home  DOA: 03/05/2023 LOS: 3  Chief complaints No chief complaint on file.    Brief Narrative / Interim history: 72 year old M with PMH of PAD/nonhealing RLE wound s/p right CFA and PTA bypass in 11/2022, prostate cancer s/p rad, HTN, HLD, GERD and CKD-3A presenting with RLE pain.  Patient has been followed by vascular surgery and wound clinic for RLE wound and wound VAC.  He was found to have thrombosis of the graft on visit from 2/21 where ABI on the right was noted to be 0.  Evaluated by vascular surgery.  SFA occlusion with no tibial runoff on catheterization on 3/11.  Unfortunately, no revascularization option for his RLE.  Vascular surgery recommended AKA which is now planned for 3/15.  Hospital course complicated by anemia requiring blood transfusion which is now stable.    Subjective: Seen and examined earlier this morning.  No major events overnight of this morning.  Had some pain in right leg that has improved with pain medication.  He seems to be overwhelmed and anxious about surgery but he understand that his options are limited without surgery.  Objective: Vitals:   03/07/23 2009 03/08/23 0429 03/08/23 0823 03/08/23 1216  BP: (!) 132/54 (!) 135/56 (!) 128/57 137/83  Pulse: 61 95 (!) 102 90  Resp: '17 18 16 18  '$ Temp: 99.7 F (37.6 C) 99.1 F (37.3 C) 98.6 F (37 C) 99 F (37.2 C)  TempSrc: Oral Oral Oral Oral  SpO2: 97% 97% 95% 95%  Weight:      Height:        Examination:  GENERAL: No apparent distress.  Nontoxic. HEENT: MMM.  Vision and hearing grossly intact.  NECK: Supple.  No apparent JVD.  RESP:  No IWOB.  Fair aeration bilaterally. CVS:  RRR. Heart sounds normal.  ABD/GI/GU: BS+. Abd soft, NTND.  MSK/EXT: RLE gangrene with bullae on his right toes.  Very cold to touch.  2+ DP pulse in left. SKIN: no apparent skin lesion or wound NEURO: Awake and  alert. Oriented appropriately.  No apparent focal neuro deficit. PSYCH: Somewhat anxious and tearful.  Procedures:  None  Microbiology summarized: None  Assessment and plan: Principal Problem:   Critical limb ischemia of right lower extremity (HCC) Active Problems:   Acute kidney injury superimposed on chronic kidney disease (HCC)   Metabolic acidosis, increased anion gap   Hyperkalemia   Normocytic anemia   Essential hypertension   History of prostate cancer  Critical ischemia of the right leg: s/p arteriogram that showed SFA occlusion with no tibial runoff. -Plan for right AKA by vascular surgery on 3/15.  AKI on CKD-3A: Improving.  Renal US with moderate right and mild left hydronephrosis.  UA negative. Recent Labs    12/06/22 1040 12/13/22 0410 12/14/22 0126 03/05/23 1008 03/05/23 1031 03/05/23 1038 03/05/23 1846 03/06/23 0139 03/07/23 0142 03/08/23 0137  BUN 27* 28* 33* 128* 127* 136* 122* 113* 77* 50*  CREATININE 1.49* 1.36* 1.43* 5.10* 5.10* 4.42* 4.00* 3.56* 3.07* 2.90*  -Continue Foley catheter. -Continue monitoring -Avoid nephrotoxic meds  Normocytic anemia: Hgb reached nadir at 5.9 on 3/12.  Unclear cause of this.  No overt bleeding.  Transfused 2 units with appropriate response.  Anemia panel suggests anemia of chronic disease Recent Labs    12/12/22 1326 12/13/22 0410 12/14/22 0126 03/05/23 1008 03/05/23 1031 03/05/23 1846 03/06/23 0139 03/06/23 2335 03/07/23 0142  03/08/23 0137  HGB 8.8* 7.9* 8.0* 7.8* 7.1* 7.0* 5.9* 8.4* 8.2* 8.5*  -Continue monitoring  Hyperkalemia/metabolic acidosis: Resolved  Essential hypertension: Normotensive -Continue metoprolol   Leukocytosis/bandemia: Reactive? -Continue monitoring   Thrombocytosis: Improved.  Likely reactive. Continue to monitor.    Mildly elevated liver enzymes:  -Monitor   Hyperlipidemia:  -Started   H/o Prostate ca: S/p radiation therapy.    Anxiety:  -On ativan prn.    Mild  hyponatremia:     Body mass index is 29.84 kg/m.          DVT prophylaxis:  heparin injection 5,000 Units Start: 03/05/23 2200 SCDs Start: 03/05/23 1136  Code Status: Full code Family Communication: None at bedside Level of care: Telemetry Medical Status is: Inpatient Remains inpatient appropriate because: Critical limb ischemia   Final disposition: TBD Consultants:  Vascular surgery  35 minutes with more than 50% spent in reviewing records, counseling patient/family and coordinating care.   Sch Meds:  Scheduled Meds:  atorvastatin  40 mg Oral q morning   Chlorhexidine Gluconate Cloth  6 each Topical Daily   heparin  5,000 Units Subcutaneous Q8H   metoprolol tartrate  12.5 mg Oral BID   pantoprazole  40 mg Oral Daily   sodium chloride flush  3 mL Intravenous Q12H   sodium chloride flush  3 mL Intravenous Q12H   Continuous Infusions:  sodium chloride Stopped (03/06/23 1725)   sodium chloride 100 mL/hr at 03/08/23 1458   PRN Meds:.sodium chloride, acetaminophen **OR** acetaminophen, albuterol, hydrALAZINE, labetalol, LORazepam, ondansetron (ZOFRAN) IV, oxyCODONE, sodium chloride flush  Antimicrobials: Anti-infectives (From admission, onward)    None        I have personally reviewed the following labs and images: CBC: Recent Labs  Lab 03/05/23 1846 03/06/23 0139 03/06/23 2335 03/07/23 0142 03/08/23 0137  WBC 16.6* 14.4*  --  16.0* 17.7*  NEUTROABS  --   --   --  12.8*  --   HGB 7.0* 5.9* 8.4* 8.2* 8.5*  HCT 20.6* 17.0* 23.7* 23.9* 25.0*  MCV 83.7 84.6  --  84.2 85.9  PLT 590* 490*  --  448* 459*   BMP &GFR Recent Labs  Lab 03/05/23 1038 03/05/23 1846 03/06/23 0139 03/07/23 0142 03/08/23 0137  NA 134* 135 133* 135 135  K 6.1* 4.9 4.7 4.5 4.5  CL 104 101 99 99 104  CO2 13* 20* '23 23 22  '$ GLUCOSE 149* 130* 147* 117* 114*  BUN 136* 122* 113* 77* 50*  CREATININE 4.42* 4.00* 3.56* 3.07* 2.90*  CALCIUM 9.4 9.1 8.5* 8.5* 8.8*  MG  --   --   --    --  2.0  PHOS  --   --   --   --  3.5   Estimated Creatinine Clearance: 27.8 mL/min (A) (by C-G formula based on SCr of 2.9 mg/dL (H)). Liver & Pancreas: Recent Labs  Lab 03/05/23 1846 03/08/23 0137  AST 64*  --   ALT 93*  --   ALKPHOS 104  --   BILITOT 0.5  --   PROT 7.0  --   ALBUMIN 2.4* 2.0*   No results for input(s): "LIPASE", "AMYLASE" in the last 168 hours. No results for input(s): "AMMONIA" in the last 168 hours. Diabetic: No results for input(s): "HGBA1C" in the last 72 hours. No results for input(s): "GLUCAP" in the last 168 hours. Cardiac Enzymes: No results for input(s): "CKTOTAL", "CKMB", "CKMBINDEX", "TROPONINI" in the last 168 hours. No results for input(s): "PROBNP" in  the last 8760 hours. Coagulation Profile: No results for input(s): "INR", "PROTIME" in the last 168 hours. Thyroid Function Tests: No results for input(s): "TSH", "T4TOTAL", "FREET4", "T3FREE", "THYROIDAB" in the last 72 hours. Lipid Profile: No results for input(s): "CHOL", "HDL", "LDLCALC", "TRIG", "CHOLHDL", "LDLDIRECT" in the last 72 hours. Anemia Panel: Recent Labs    03/06/23 2335  VITAMINB12 954*  FOLATE 32.5  FERRITIN 621*  TIBC 172*  IRON 79  RETICCTPCT 1.4   Urine analysis:    Component Value Date/Time   COLORURINE YELLOW 03/06/2023 1420   APPEARANCEUR CLEAR 03/06/2023 1420   LABSPEC 1.010 03/06/2023 1420   PHURINE 8.0 03/06/2023 1420   GLUCOSEU NEGATIVE 03/06/2023 1420   HGBUR SMALL (A) 03/06/2023 1420   BILIRUBINUR NEGATIVE 03/06/2023 1420   KETONESUR NEGATIVE 03/06/2023 1420   PROTEINUR NEGATIVE 03/06/2023 1420   NITRITE NEGATIVE 03/06/2023 1420   LEUKOCYTESUR NEGATIVE 03/06/2023 1420   Sepsis Labs: Invalid input(s): "PROCALCITONIN", "LACTICIDVEN"  Microbiology: No results found for this or any previous visit (from the past 240 hour(s)).  Radiology Studies: No results found.    Vincent Nigh T. [place]  If 7PM-7AM, please contact  night-coverage [email or web address] 03/08/2023, 4:07 PM

## 2023-03-08 NOTE — Progress Notes (Addendum)
  Progress Note    03/08/2023 8:45 AM 3 Days Post-Op  Subjective:  no major complaints. Says he just is not ready to proceed with Amputation yet. He says he knows he needs to have amputation and understands that he will proceed but feels rushed to have it done   Vitals:   03/08/23 0429 03/08/23 0823  BP: (!) 135/56 (!) 128/57  Pulse: 95 (!) 102  Resp: 18 16  Temp: 99.1 F (37.3 C) 98.6 F (37 C)  SpO2: 97% 95%   Physical Exam: Cardiac:  regular Lungs:  non labored Incisions:  right leg wounds dressed Extremities:  Femoral pulses palpable bilaterally. No distal pulses palpable Abdomen:  soft Neurologic: alert and oriented  CBC    Component Value Date/Time   WBC 17.7 (H) 03/08/2023 0137   RBC 2.91 (L) 03/08/2023 0137   HGB 8.5 (L) 03/08/2023 0137   HCT 25.0 (L) 03/08/2023 0137   PLT 459 (H) 03/08/2023 0137   MCV 85.9 03/08/2023 0137   MCH 29.2 03/08/2023 0137   MCHC 34.0 03/08/2023 0137   RDW 16.3 (H) 03/08/2023 0137   LYMPHSABS 1.6 03/07/2023 0142   MONOABS 1.2 (H) 03/07/2023 0142   EOSABS 0.2 03/07/2023 0142   BASOSABS 0.1 03/07/2023 0142    BMET    Component Value Date/Time   NA 135 03/08/2023 0137   K 4.5 03/08/2023 0137   CL 104 03/08/2023 0137   CO2 22 03/08/2023 0137   GLUCOSE 114 (H) 03/08/2023 0137   BUN 50 (H) 03/08/2023 0137   CREATININE 2.90 (H) 03/08/2023 0137   CALCIUM 8.8 (L) 03/08/2023 0137   GFRNONAA 22 (L) 03/08/2023 0137   GFRAA (L) 03/18/2010 0457    60        The eGFR has been calculated using the MDRD equation. This calculation has not been validated in all clinical situations. eGFR's persistently <60 mL/min signify possible Chronic Kidney Disease.    INR    Component Value Date/Time   INR 1.0 12/06/2022 1040     Intake/Output Summary (Last 24 hours) at 03/08/2023 0845 Last data filed at 03/08/2023 0600 Gross per 24 hour  Intake 1697 ml  Output 3900 ml  Net -2203 ml     Assessment/Plan:  72 y.o. male  with CLI  with right foot gangrene  Does not have any revascularization options Needs right AKA H&H stable Tentatively plan for tomorrow.   Karoline Caldwell, PA-C [facility] [phone] 03/08/2023 8:45 AM  I have independently interviewed and examined patient and agree with PA assessment and plan above.  I discussed with the patient and the primary team Dr. Cyndia Skeeters and patient's H&H is stable and creatinine has improved and stabilized.  Plan will be for right AKA tomorrow in the OR.  He will be n.p.o. past midnight.  Ameisha Mcclellan C. Donzetta Matters, MD [facility] [place] Office: [phone] Pager: [phone]

## 2023-03-08 NOTE — Care Management Important Message (Signed)
Important Message  Patient Details  Name: [name] MRN: [id number] Date of Birth: 1951/07/24   Medicare Important Message Given:  Yes     [name] 03/08/2023, 8:47 AM

## 2023-03-08 NOTE — Progress Notes (Signed)
   03/08/23 1709  Assess: MEWS Score  Temp (!) 101.1 F (38.4 C)  BP 135/64  MAP (mmHg) 86  Pulse Rate 100  ECG Heart Rate (!) 102  Resp 19  SpO2 96 %  O2 Device Room Air  Assess: MEWS Score  MEWS Temp 1  MEWS Systolic 0  MEWS Pulse 1  MEWS RR 0  MEWS LOC 0  MEWS Score 2  MEWS Score Color Yellow  Assess: if the MEWS score is Yellow or Red  Were vital signs taken at a resting state? Yes  Focused Assessment No change from prior assessment  Does the patient meet 2 or more of the SIRS criteria? Yes  Does the patient have a confirmed or suspected source of infection? No  MEWS guidelines implemented  Yes, yellow  Treat  MEWS Interventions Considered administering scheduled or prn medications/treatments as ordered  Take Vital Signs  Increase Vital Sign Frequency  Yellow: Q2hr x1, continue Q4hrs until patient remains green for 12hrs  Escalate  MEWS: Escalate Yellow: Discuss with charge nurse and consider notifying provider and/or RRT  Assess: SIRS CRITERIA  SIRS Temperature  1  SIRS Pulse 1  SIRS Respirations  0  SIRS WBC 0  SIRS Score Sum  2

## 2023-03-09 ENCOUNTER — Inpatient Hospital Stay (HOSPITAL_COMMUNITY): Payer: Medicare Other | Admitting: Certified Registered Nurse Anesthetist

## 2023-03-09 ENCOUNTER — Other Ambulatory Visit: Payer: Self-pay

## 2023-03-09 ENCOUNTER — Encounter (HOSPITAL_COMMUNITY)
Admission: AD | Disposition: A | Discharge status: Rehabilitation Facility | Payer: Self-pay | Source: Home / Self Care | Attending: Student

## 2023-03-09 ENCOUNTER — Encounter (HOSPITAL_COMMUNITY): Payer: Self-pay | Admitting: Internal Medicine

## 2023-03-09 DIAGNOSIS — I10 Essential (primary) hypertension: Secondary | ICD-10-CM | POA: Diagnosis not present

## 2023-03-09 DIAGNOSIS — I96 Gangrene, not elsewhere classified: Secondary | ICD-10-CM

## 2023-03-09 DIAGNOSIS — M6282 Rhabdomyolysis: Secondary | ICD-10-CM | POA: Insufficient documentation

## 2023-03-09 DIAGNOSIS — D649 Anemia, unspecified: Secondary | ICD-10-CM | POA: Diagnosis not present

## 2023-03-09 DIAGNOSIS — Z8546 Personal history of malignant neoplasm of prostate: Secondary | ICD-10-CM | POA: Diagnosis not present

## 2023-03-09 DIAGNOSIS — I70221 Atherosclerosis of native arteries of extremities with rest pain, right leg: Secondary | ICD-10-CM | POA: Diagnosis not present

## 2023-03-09 DIAGNOSIS — R509 Fever, unspecified: Secondary | ICD-10-CM | POA: Insufficient documentation

## 2023-03-09 DIAGNOSIS — D72825 Bandemia: Secondary | ICD-10-CM

## 2023-03-09 DIAGNOSIS — L03115 Cellulitis of right lower limb: Secondary | ICD-10-CM

## 2023-03-09 DIAGNOSIS — Z87891 Personal history of nicotine dependence: Secondary | ICD-10-CM

## 2023-03-09 DIAGNOSIS — T8189XA Other complications of procedures, not elsewhere classified, initial encounter: Secondary | ICD-10-CM

## 2023-03-09 DIAGNOSIS — I739 Peripheral vascular disease, unspecified: Secondary | ICD-10-CM

## 2023-03-09 HISTORY — PX: AMPUTATION: SHX166

## 2023-03-09 LAB — COMPREHENSIVE METABOLIC PANEL
ALT: 69 U/L — ABNORMAL HIGH (ref 0–44)
AST: 74 U/L — ABNORMAL HIGH (ref 15–41)
Albumin: 1.9 g/dL — ABNORMAL LOW (ref 3.5–5.0)
Alkaline Phosphatase: 102 U/L (ref 38–126)
Anion gap: 8 (ref 5–15)
BUN: 38 mg/dL — ABNORMAL HIGH (ref 8–23)
CO2: 18 mmol/L — ABNORMAL LOW (ref 22–32)
Calcium: 8.6 mg/dL — ABNORMAL LOW (ref 8.9–10.3)
Chloride: 106 mmol/L (ref 98–111)
Creatinine, Ser: 2.57 mg/dL — ABNORMAL HIGH (ref 0.61–1.24)
GFR, Estimated: 26 mL/min — ABNORMAL LOW (ref >60)
Glucose, Bld: 141 mg/dL — ABNORMAL HIGH (ref 70–99)
Potassium: 4.7 mmol/L (ref 3.5–5.1)
Sodium: 132 mmol/L — ABNORMAL LOW (ref 135–145)
Total Bilirubin: 0.5 mg/dL (ref 0.3–1.2)
Total Protein: 6.3 g/dL — ABNORMAL LOW (ref 6.5–8.1)

## 2023-03-09 LAB — CBC
HCT: 23.7 % — ABNORMAL LOW (ref 39.0–52.0)
Hemoglobin: 8 g/dL — ABNORMAL LOW (ref 13.0–17.0)
MCH: 29.3 pg (ref 26.0–34.0)
MCHC: 33.8 g/dL (ref 30.0–36.0)
MCV: 86.8 fL (ref 80.0–100.0)
Platelets: 438 10*3/uL — ABNORMAL HIGH (ref 150–400)
RBC: 2.73 MIL/uL — ABNORMAL LOW (ref 4.22–5.81)
RDW: 16.1 % — ABNORMAL HIGH (ref 11.5–15.5)
WBC: 18.4 10*3/uL — ABNORMAL HIGH (ref 4.0–10.5)
nRBC: 0 % (ref 0.0–0.2)

## 2023-03-09 LAB — CK: Total CK: 1792 U/L — ABNORMAL HIGH (ref 49–397)

## 2023-03-09 LAB — MAGNESIUM: Magnesium: 1.7 mg/dL (ref 1.7–2.4)

## 2023-03-09 LAB — SURGICAL PCR SCREEN
MRSA, PCR: NEGATIVE
Staphylococcus aureus: NEGATIVE

## 2023-03-09 LAB — PHOSPHORUS: Phosphorus: 2.7 mg/dL (ref 2.5–4.6)

## 2023-03-09 SURGERY — AMPUTATION, ABOVE KNEE
Anesthesia: General | Site: Knee | Laterality: Right

## 2023-03-09 MED ORDER — FENTANYL CITRATE (PF) 100 MCG/2ML IJ SOLN
INTRAMUSCULAR | Status: AC
  Filled 2023-03-09: qty 2

## 2023-03-09 MED ORDER — 0.9 % SODIUM CHLORIDE (POUR BTL) OPTIME
TOPICAL | Status: DC | PRN
  Administered 2023-03-09: 1000 mL

## 2023-03-09 MED ORDER — DEXAMETHASONE SODIUM PHOSPHATE 10 MG/ML IJ SOLN
INTRAMUSCULAR | Status: DC | PRN
  Administered 2023-03-09: 8 mg via INTRAVENOUS

## 2023-03-09 MED ORDER — PHENYLEPHRINE 80 MCG/ML (10ML) SYRINGE FOR IV PUSH (FOR BLOOD PRESSURE SUPPORT)
PREFILLED_SYRINGE | INTRAVENOUS | Status: DC | PRN
  Administered 2023-03-09: 80 ug via INTRAVENOUS

## 2023-03-09 MED ORDER — FENTANYL CITRATE (PF) 100 MCG/2ML IJ SOLN
25.0000 ug | INTRAMUSCULAR | Status: DC | PRN
  Administered 2023-03-09: 25 ug via INTRAVENOUS

## 2023-03-09 MED ORDER — ASPIRIN 81 MG PO TBEC
81.0000 mg | DELAYED_RELEASE_TABLET | Freq: Every day | ORAL | Status: DC
  Administered 2023-03-09 – 2023-03-13 (×5): 81 mg via ORAL
  Filled 2023-03-09 (×5): qty 1

## 2023-03-09 MED ORDER — ACETAMINOPHEN 10 MG/ML IV SOLN
1000.0000 mg | Freq: Once | INTRAVENOUS | Status: DC | PRN
  Administered 2023-03-09: 1000 mg via INTRAVENOUS

## 2023-03-09 MED ORDER — SODIUM CHLORIDE 0.9 % IV SOLN: INTRAVENOUS | Status: DC

## 2023-03-09 MED ORDER — MORPHINE SULFATE (PF) 2 MG/ML IV SOLN
2.0000 mg | INTRAVENOUS | Status: DC | PRN
  Administered 2023-03-09: 2 mg via INTRAVENOUS
  Filled 2023-03-09: qty 1

## 2023-03-09 MED ORDER — ACETAMINOPHEN 10 MG/ML IV SOLN
INTRAVENOUS | Status: AC
  Filled 2023-03-09: qty 100

## 2023-03-09 MED ORDER — FENTANYL CITRATE (PF) 100 MCG/2ML IJ SOLN
INTRAMUSCULAR | Status: DC | PRN
  Administered 2023-03-09 (×4): 50 ug via INTRAVENOUS

## 2023-03-09 MED ORDER — MIDAZOLAM HCL 2 MG/2ML IJ SOLN
INTRAMUSCULAR | Status: AC
  Filled 2023-03-09: qty 2

## 2023-03-09 MED ORDER — ORAL CARE MOUTH RINSE: 15.0000 mL | Freq: Once | OROMUCOSAL | Status: AC

## 2023-03-09 MED ORDER — LACTATED RINGERS IV SOLN: INTRAVENOUS | Status: DC | PRN

## 2023-03-09 MED ORDER — CHLORHEXIDINE GLUCONATE CLOTH 2 % EX PADS
6.0000 | MEDICATED_PAD | Freq: Once | CUTANEOUS | Status: AC
  Administered 2023-03-09: 6 via TOPICAL

## 2023-03-09 MED ORDER — LIDOCAINE 2% (20 MG/ML) 5 ML SYRINGE
INTRAMUSCULAR | Status: DC | PRN
  Administered 2023-03-09: 60 mg via INTRAVENOUS

## 2023-03-09 MED ORDER — CHLORHEXIDINE GLUCONATE 0.12 % MT SOLN
15.0000 mL | Freq: Once | OROMUCOSAL | Status: AC
  Administered 2023-03-09: 15 mL via OROMUCOSAL

## 2023-03-09 MED ORDER — PROPOFOL 10 MG/ML IV BOLUS
INTRAVENOUS | Status: DC | PRN
  Administered 2023-03-09: 130 mg via INTRAVENOUS

## 2023-03-09 MED ORDER — FENTANYL CITRATE (PF) 250 MCG/5ML IJ SOLN
INTRAMUSCULAR | Status: AC
  Filled 2023-03-09: qty 5

## 2023-03-09 MED ORDER — ONDANSETRON HCL 4 MG/2ML IJ SOLN
INTRAMUSCULAR | Status: DC | PRN
  Administered 2023-03-09: 4 mg via INTRAVENOUS

## 2023-03-09 MED ORDER — PHENYLEPHRINE HCL-NACL 20-0.9 MG/250ML-% IV SOLN
INTRAVENOUS | Status: DC | PRN
  Administered 2023-03-09: 25 ug/min via INTRAVENOUS

## 2023-03-09 MED ORDER — SODIUM CHLORIDE 0.45 % IV SOLN
INTRAVENOUS | Status: DC
  Filled 2023-03-09 (×2): qty 75

## 2023-03-09 MED ORDER — SODIUM CHLORIDE 0.9 % IV SOLN
2.0000 g | INTRAVENOUS | Status: DC
  Administered 2023-03-09 – 2023-03-13 (×5): 2 g via INTRAVENOUS
  Filled 2023-03-09 (×6): qty 20

## 2023-03-09 SURGICAL SUPPLY — 62 items
BAG COUNTER SPONGE SURGICOUNT (BAG) ×1 IMPLANT
BAG SPNG CNTER NS LX DISP (BAG) ×1
BANDAGE ESMARK 6X9 LF (GAUZE/BANDAGES/DRESSINGS) ×1 IMPLANT
BLADE SAGITTAL (BLADE) ×1
BLADE SAGITTAL 25.0X1.19X90 (BLADE) IMPLANT
BLADE SAW GIGLI 510 (BLADE) ×1 IMPLANT
BLADE SAW THK.89X75X18XSGTL (BLADE) IMPLANT
BNDG CMPR 5X6 CHSV STRCH STRL (GAUZE/BANDAGES/DRESSINGS) ×1
BNDG CMPR 9X6 STRL LF SNTH (GAUZE/BANDAGES/DRESSINGS) ×1
BNDG CMPR MED 10X6 ELC LF (GAUZE/BANDAGES/DRESSINGS) ×1
BNDG COHESIVE 6X5 TAN ST LF (GAUZE/BANDAGES/DRESSINGS) ×1 IMPLANT
BNDG ELASTIC 4X5.8 VLCR STR LF (GAUZE/BANDAGES/DRESSINGS) ×1 IMPLANT
BNDG ELASTIC 6X10 VLCR STRL LF (GAUZE/BANDAGES/DRESSINGS) IMPLANT
BNDG ELASTIC 6X5.8 VLCR STR LF (GAUZE/BANDAGES/DRESSINGS) ×1 IMPLANT
BNDG ESMARK 6X9 LF (GAUZE/BANDAGES/DRESSINGS) ×1
BNDG GAUZE DERMACEA FLUFF 4 (GAUZE/BANDAGES/DRESSINGS) ×1 IMPLANT
BNDG GZE DERMACEA 4 6PLY (GAUZE/BANDAGES/DRESSINGS) ×1
CANISTER SUCT 3000ML PPV (MISCELLANEOUS) ×1 IMPLANT
CLIP LIGATING EXTRA MED SLVR (CLIP) ×1 IMPLANT
CLIP LIGATING EXTRA SM BLUE (MISCELLANEOUS) ×1 IMPLANT
COVER SURGICAL LIGHT HANDLE (MISCELLANEOUS) ×1 IMPLANT
CUFF TOURN SGL QUICK 24 (TOURNIQUET CUFF)
CUFF TOURN SGL QUICK 34 (TOURNIQUET CUFF)
CUFF TRNQT CYL 24X4X16.5-23 (TOURNIQUET CUFF) IMPLANT
CUFF TRNQT CYL 34X4.125X (TOURNIQUET CUFF) IMPLANT
DRAIN CHANNEL 19F RND (DRAIN) IMPLANT
DRAPE DERMATAC (DRAPES) IMPLANT
DRAPE HALF SHEET 40X57 (DRAPES) ×1 IMPLANT
DRAPE INCISE IOBAN 66X45 STRL (DRAPES) IMPLANT
DRAPE ORTHO SPLIT 77X108 STRL (DRAPES) ×2
DRAPE SURG ORHT 6 SPLT 77X108 (DRAPES) ×2 IMPLANT
DRSG ADAPTIC 3X8 NADH LF (GAUZE/BANDAGES/DRESSINGS) ×1 IMPLANT
ELECT CAUTERY BLADE 6.4 (BLADE) ×1 IMPLANT
ELECT REM PT RETURN 9FT ADLT (ELECTROSURGICAL) ×1
ELECTRODE REM PT RTRN 9FT ADLT (ELECTROSURGICAL) ×1 IMPLANT
EVACUATOR SILICONE 100CC (DRAIN) IMPLANT
GAUZE SPONGE 4X4 12PLY STRL (GAUZE/BANDAGES/DRESSINGS) ×1 IMPLANT
GAUZE XEROFORM 5X9 LF (GAUZE/BANDAGES/DRESSINGS) IMPLANT
GLOVE BIO SURGEON STRL SZ7.5 (GLOVE) ×1 IMPLANT
GLOVE BIOGEL PI IND STRL 7.0 (GLOVE) IMPLANT
GOWN STRL REUS W/ TWL LRG LVL3 (GOWN DISPOSABLE) ×2 IMPLANT
GOWN STRL REUS W/ TWL XL LVL3 (GOWN DISPOSABLE) ×1 IMPLANT
GOWN STRL REUS W/TWL LRG LVL3 (GOWN DISPOSABLE) ×2
GOWN STRL REUS W/TWL XL LVL3 (GOWN DISPOSABLE) ×1
KIT BASIN OR (CUSTOM PROCEDURE TRAY) ×1 IMPLANT
KIT TURNOVER KIT B (KITS) ×1 IMPLANT
NS IRRIG 1000ML POUR BTL (IV SOLUTION) ×1 IMPLANT
PACK GENERAL/GYN (CUSTOM PROCEDURE TRAY) ×1 IMPLANT
PAD ARMBOARD 7.5X6 YLW CONV (MISCELLANEOUS) ×2 IMPLANT
POWDER SURGICEL 3.0 GRAM (HEMOSTASIS) IMPLANT
STAPLER VISISTAT 35W (STAPLE) ×1 IMPLANT
STOCKINETTE IMPERVIOUS LG (DRAPES) ×1 IMPLANT
SUT ETHILON 3 0 PS 1 (SUTURE) IMPLANT
SUT SILK 0 TIES 10X30 (SUTURE) ×1 IMPLANT
SUT SILK 2 0 (SUTURE) ×1
SUT SILK 2-0 18XBRD TIE 12 (SUTURE) ×1 IMPLANT
SUT SILK 3 0 (SUTURE)
SUT SILK 3-0 18XBRD TIE 12 (SUTURE) IMPLANT
SUT VIC AB 2-0 CT1 18 (SUTURE) ×2 IMPLANT
TOWEL GREEN STERILE (TOWEL DISPOSABLE) ×2 IMPLANT
UNDERPAD 30X36 HEAVY ABSORB (UNDERPADS AND DIAPERS) ×1 IMPLANT
WATER STERILE IRR 1000ML POUR (IV SOLUTION) ×1 IMPLANT

## 2023-03-09 NOTE — Progress Notes (Addendum)
  Progress Note    03/09/2023 2:52 PM   Subjective:  nervous about surgery  Vitals:   03/09/23 0819 03/09/23 0927  BP: (!) 128/52 134/61  Pulse: 90   Resp: 18   Temp: 98.3 F (36.8 C)   SpO2:      Physical Exam: Right leg wound with dressing in place  CBC    Component Value Date/Time   WBC 18.4 (H) 03/09/2023 0224   RBC 2.73 (L) 03/09/2023 0224   HGB 8.0 (L) 03/09/2023 0224   HCT 23.7 (L) 03/09/2023 0224   PLT 438 (H) 03/09/2023 0224   MCV 86.8 03/09/2023 0224   MCH 29.3 03/09/2023 0224   MCHC 33.8 03/09/2023 0224   RDW 16.1 (H) 03/09/2023 0224   LYMPHSABS 1.6 03/07/2023 0142   MONOABS 1.2 (H) 03/07/2023 0142   EOSABS 0.2 03/07/2023 0142   BASOSABS 0.1 03/07/2023 0142    BMET    Component Value Date/Time   NA 132 (L) 03/09/2023 0224   K 4.7 03/09/2023 0224   CL 106 03/09/2023 0224   CO2 18 (L) 03/09/2023 0224   GLUCOSE 141 (H) 03/09/2023 0224   BUN 38 (H) 03/09/2023 0224   CREATININE 2.57 (H) 03/09/2023 0224   CALCIUM 8.6 (L) 03/09/2023 0224   GFRNONAA 26 (L) 03/09/2023 0224   GFRAA (L) 03/18/2010 0457    60        The eGFR has been calculated using the MDRD equation. This calculation has not been validated in all clinical situations. eGFR's persistently <60 mL/min signify possible Chronic Kidney Disease.    INR    Component Value Date/Time   INR 1.0 12/06/2022 1040     Intake/Output Summary (Last 24 hours) at 03/09/2023 1452 Last data filed at 03/09/2023 [id number] Gross per 24 hour  Intake 897.17 ml  Output 2150 ml  Net -1252.83 ml     Assessment:  72 y.o. male is here CLI of RLE with gangrene  Plan: He is scheduled for right AKA today with Dr. Donzetta Matters  Keep NPO  Patient remains agreeable to proceed. He did not have any questions regarding surgery today   Marval Regal [facility] [phone] 03/09/2023 7:55 AM  I have independently interviewed and examined patient and agree with PA assessment and plan  above. R aka today in OR.   Cierria Height C. Donzetta Matters, MD [facility] [place] Office: [phone] Pager: [phone]

## 2023-03-09 NOTE — Anesthesia Procedure Notes (Signed)
Procedure Name: LMA Insertion Date/Time: 03/09/2023 3:41 PM  Performed by: Inda Coke, CRNAPre-anesthesia Checklist: Patient identified, Emergency Drugs available, Suction available, Timeout performed and Patient being monitored Patient Re-evaluated:Patient Re-evaluated prior to induction Oxygen Delivery Method: Circle system utilized Preoxygenation: Pre-oxygenation with 100% oxygen Induction Type: IV induction Ventilation: Mask ventilation without difficulty LMA: LMA with gastric port inserted LMA Size: 5.0 Tube type: Oral Placement Confirmation: positive ETCO2, CO2 detector and breath sounds checked- equal and bilateral Tube secured with: Tape Dental Injury: Teeth and Oropharynx as per pre-operative assessment

## 2023-03-09 NOTE — Progress Notes (Signed)
PROGRESS NOTE  [name] [id number] DOB: May 13, 1951   PCP: Iona Beard, MD  Patient is from: Home  DOA: 03/05/2023 LOS: 4  Chief complaints No chief complaint on file.    Brief Narrative / Interim history: 72 year old M with PMH of PAD/nonhealing RLE wound s/p right CFA and PTA bypass in 11/2022, prostate cancer s/p rad, HTN, HLD, GERD and CKD-3A presenting with RLE pain.  Patient has been followed by vascular surgery and wound clinic for RLE wound and wound VAC.  He was found to have thrombosis of the graft on visit from 2/21 where ABI on the right was noted to be 0.  Evaluated by vascular surgery.  SFA occlusion with no tibial runoff on catheterization on 3/11.  Unfortunately, no revascularization option for his RLE.  Vascular surgery recommended AKA which is now planned for 3/15.  Hospital course complicated by anemia requiring blood transfusion which is now stable.    Subjective: Seen and examined earlier this morning.  He had an episode of fever to 101.1 about 5 PM yesterday.  He was briefly tachycardic.  Has worsening leukocytosis.  No clear source of infection other than possible RLE cellulitis.  Also has elevated CK to 1800.  Patient has no specific complaints other than mild pain in RLE that has improved after pain medication.  No respiratory and GI symptoms.  He has Foley catheter in.  Objective: Vitals:   03/08/23 2309 03/09/23 0518 03/09/23 0819 03/09/23 0927  BP: (!) 143/66 137/61 (!) 128/52 134/61  Pulse: 83 98 90   Resp: 20 18 18    Temp: 98.2 F (36.8 C) 99.2 F (37.3 C) 98.3 F (36.8 C)   TempSrc: Oral Oral Oral   SpO2: 97% 96%    Weight:      Height:        Examination:  GENERAL: No apparent distress.  Nontoxic. HEENT: MMM.  Vision and hearing grossly intact.  NECK: Supple.  No apparent JVD.  RESP:  No IWOB.  Fair aeration bilaterally. CVS:  RRR. Heart sounds normal.  ABD/GI/GU: BS+. Abd soft, NTND.  MSK/EXT:   RLE gangrene with bulla on  his right toes.  Very cold to touch.  2+ DP pulse in left SKIN: As above. NEURO: Awake and alert. Oriented appropriately.  No apparent focal neuro deficit. PSYCH: Calm. Normal affect.   Procedures:  None  Microbiology summarized: Blood cultures NGTD MRSA PCR screen negative.  Assessment and plan: Principal Problem:   Critical limb ischemia of right lower extremity (HCC) Active Problems:   Acute kidney injury superimposed on chronic kidney disease (HCC)   Metabolic acidosis, increased anion gap   Hyperkalemia   Normocytic anemia   Essential hypertension   History of prostate cancer   Cellulitis of right lower extremity   Fever   Non-traumatic rhabdomyolysis   Bandemia  Critical ischemia of the right leg: s/p arteriogram that showed SFA occlusion with no tibial runoff. -Plan for right AKA by vascular surgery on 3/15.  AKI on CKD-3A: Renal US with moderate right and mild left hydronephrosis.  UA negative.  CK elevated.  AKI improving. Recent Labs    12/13/22 0410 12/14/22 0126 03/05/23 1008 03/05/23 1031 03/05/23 1038 03/05/23 1846 03/06/23 0139 03/07/23 0142 03/08/23 0137 03/09/23 0224  BUN 28* 33* 128* 127* 136* 122* 113* 77* 50* 38*  CREATININE 1.36* 1.43* 5.10* 5.10* 4.42* 4.00* 3.56* 3.07* 2.90* 2.57*  -Continue Foley catheter. -Continue monitoring -Avoid nephrotoxic meds -Continue trending CK  Fever/leukocytosis/bandemia: Possible RLE cellulitis.  Blood cultures NGTD.  No respiratory, GI or UTI symptoms. -IV ceftriaxone -Follow-up blood culture and leukocytosis.  Normocytic anemia: Hgb reached nadir at 5.9 on 3/12.  Unclear cause of this.  No overt bleeding.  Transfused 2 units with appropriate response.  Anemia panel suggests anemia of chronic disease Recent Labs    12/13/22 0410 12/14/22 0126 03/05/23 1008 03/05/23 1031 03/05/23 1846 03/06/23 0139 03/06/23 2335 03/07/23 0142 03/08/23 0137 03/09/23 0224  HGB 7.9* 8.0* 7.8* 7.1* 7.0* 5.9* 8.4*  8.2* 8.5* 8.0*  -Continue monitoring  Hyperkalemia/metabolic acidosis: Resolved  Essential hypertension: Normotensive -Continue metoprolol   Thrombocytosis: Likely reactive.  Stable. Continue to monitor.    Mildly elevated liver enzymes: Likely due to rhabdo.  Improving. -Monitor  Nontraumatic rhabdomyolysis: Likely due to RLE ischemia/gangrene. -Continue monitoring -IV fluid  Metabolic acidosis with normal anion gap: -Change NS to bicarbonate infusion.   Hyperlipidemia:  -Started   H/o Prostate ca: S/p radiation therapy.    Anxiety:  -On ativan prn.    Mild hyponatremia: Continue monitoring    Body mass index is 29.84 kg/m.          DVT prophylaxis:  heparin injection 5,000 Units Start: 03/05/23 2200 SCDs Start: 03/05/23 1136  Code Status: Full code Family Communication: Updated patient's sister over the phone from bedside Level of care: Telemetry Medical Status is: Inpatient Remains inpatient appropriate because: Critical limb ischemia   Final disposition: TBD Consultants:  Vascular surgery  55 minutes with more than 50% spent in reviewing records, counseling patient/family and coordinating care.   Sch Meds:  Scheduled Meds:  Chlorhexidine Gluconate Cloth  6 each Topical Daily   Chlorhexidine Gluconate Cloth  6 each Topical Once   heparin  5,000 Units Subcutaneous Q8H   metoprolol tartrate  12.5 mg Oral BID   pantoprazole  40 mg Oral Daily   sodium chloride flush  3 mL Intravenous Q12H   sodium chloride flush  3 mL Intravenous Q12H   Continuous Infusions:  sodium chloride Stopped (03/06/23 1725)   cefTRIAXone (ROCEPHIN)  IV 2 g (03/09/23 0930)   sodium bicarbonate 75 mEq in sodium chloride 0.45 % 1,075 mL infusion 100 mL/hr at 03/09/23 0935   PRN Meds:.sodium chloride, acetaminophen **OR** acetaminophen, albuterol, labetalol, LORazepam, ondansetron (ZOFRAN) IV, oxyCODONE, sodium chloride flush  Antimicrobials: Anti-infectives (From  admission, onward)    Start     Dose/Rate Route Frequency Ordered Stop   03/09/23 0800  cefTRIAXone (ROCEPHIN) 2 g in sodium chloride 0.9 % 100 mL IVPB        2 g 200 mL/hr over 30 Minutes Intravenous Every 24 hours 03/09/23 0746 03/16/23 0759        I have personally reviewed the following labs and images: CBC: Recent Labs  Lab 03/05/23 1846 03/06/23 0139 03/06/23 2335 03/07/23 0142 03/08/23 0137 03/09/23 0224  WBC 16.6* 14.4*  --  16.0* 17.7* 18.4*  NEUTROABS  --   --   --  12.8*  --   --   HGB 7.0* 5.9* 8.4* 8.2* 8.5* 8.0*  HCT 20.6* 17.0* 23.7* 23.9* 25.0* 23.7*  MCV 83.7 84.6  --  84.2 85.9 86.8  PLT 590* 490*  --  448* 459* 438*   BMP &GFR Recent Labs  Lab 03/05/23 1846 03/06/23 0139 03/07/23 0142 03/08/23 0137 03/09/23 0224  NA 135 133* 135 135 132*  K 4.9 4.7 4.5 4.5 4.7  CL 101 99 99 104 106  CO2 20* 23 23 22  18*  GLUCOSE 130* 147* 117*  114* 141*  BUN 122* 113* 77* 50* 38*  CREATININE 4.00* 3.56* 3.07* 2.90* 2.57*  CALCIUM 9.1 8.5* 8.5* 8.8* 8.6*  MG  --   --   --  2.0 1.7  PHOS  --   --   --  3.5 2.7   Estimated Creatinine Clearance: 31.3 mL/min (A) (by C-G formula based on SCr of 2.57 mg/dL (H)). Liver & Pancreas: Recent Labs  Lab 03/05/23 1846 03/08/23 0137 03/09/23 0224  AST 64*  --  74*  ALT 93*  --  69*  ALKPHOS 104  --  102  BILITOT 0.5  --  0.5  PROT 7.0  --  6.3*  ALBUMIN 2.4* 2.0* 1.9*   No results for input(s): "LIPASE", "AMYLASE" in the last 168 hours. No results for input(s): "AMMONIA" in the last 168 hours. Diabetic: No results for input(s): "HGBA1C" in the last 72 hours. No results for input(s): "GLUCAP" in the last 168 hours. Cardiac Enzymes: Recent Labs  Lab 03/09/23 0224  CKTOTAL 1,792*   No results for input(s): "PROBNP" in the last 8760 hours. Coagulation Profile: No results for input(s): "INR", "PROTIME" in the last 168 hours. Thyroid Function Tests: No results for input(s): "TSH", "T4TOTAL", "FREET4", "T3FREE",  "THYROIDAB" in the last 72 hours. Lipid Profile: No results for input(s): "CHOL", "HDL", "LDLCALC", "TRIG", "CHOLHDL", "LDLDIRECT" in the last 72 hours. Anemia Panel: Recent Labs    03/06/23 2335  VITAMINB12 954*  FOLATE 32.5  FERRITIN 621*  TIBC 172*  IRON 79  RETICCTPCT 1.4   Urine analysis:    Component Value Date/Time   COLORURINE YELLOW 03/06/2023 1420   APPEARANCEUR CLEAR 03/06/2023 1420   LABSPEC 1.010 03/06/2023 1420   PHURINE 8.0 03/06/2023 1420   GLUCOSEU NEGATIVE 03/06/2023 1420   HGBUR SMALL (A) 03/06/2023 1420   BILIRUBINUR NEGATIVE 03/06/2023 1420   KETONESUR NEGATIVE 03/06/2023 1420   PROTEINUR NEGATIVE 03/06/2023 1420   NITRITE NEGATIVE 03/06/2023 1420   LEUKOCYTESUR NEGATIVE 03/06/2023 1420   Sepsis Labs: Invalid input(s): "PROCALCITONIN", "LACTICIDVEN"  Microbiology: Recent Results (from the past 240 hour(s))  Surgical pcr screen     Status: None   Collection Time: 03/09/23  5:40 AM   Specimen: Nasal Mucosa; Nasal Swab  Result Value Ref Range Status   MRSA, PCR NEGATIVE NEGATIVE Final   Staphylococcus aureus NEGATIVE NEGATIVE Final    Comment: (NOTE) The Xpert SA Assay (FDA approved for NASAL specimens in patients 10 years of age and older), is one component of a comprehensive surveillance program. It is not intended to diagnose infection nor to guide or monitor treatment. Performed at [facility], [place] [address], [place], [place] [address]   Culture, blood (Routine X 2) w Reflex to ID Panel     Status: None (Preliminary result)   Collection Time: 03/09/23  8:08 AM   Specimen: BLOOD  Result Value Ref Range Status   Specimen Description BLOOD SITE NOT SPECIFIED  Final   Special Requests   Final    BOTTLES DRAWN AEROBIC AND ANAEROBIC Blood Culture adequate volume   Culture   Final    NO GROWTH <12 HOURS Performed at [facility], [place] [address], [place], [place] [address]    Report Status PENDING  Incomplete  Culture, blood  (Routine X 2) w Reflex to ID Panel     Status: None (Preliminary result)   Collection Time: 03/09/23  8:15 AM   Specimen: BLOOD  Result Value Ref Range Status   Specimen Description  BLOOD SITE NOT SPECIFIED  Final   Special Requests   Final    BOTTLES DRAWN AEROBIC AND ANAEROBIC Blood Culture adequate volume   Culture   Final    NO GROWTH <12 HOURS Performed at [facility], [address], [place], [place] [address]    Report Status PENDING  Incomplete    Radiology Studies: No results found.    Tammey Deeg T. Rosman  If 7PM-7AM, please contact night-coverage [email or web address] 03/09/2023, 12:53 PM

## 2023-03-09 NOTE — Op Note (Signed)
    Patient name: [name] MRN: [id number] DOB: 1951/09/19 Sex: male  03/09/2023 Pre-operative Diagnosis: Necrosis right foot and ankle Post-operative diagnosis:  Same Surgeon:  Erlene Quan C. Donzetta Matters, MD Assistant: Laurence Slate, PA Procedure Performed: Right above-knee amputation  Indications: 72 year old male with history of right lower extremity bypass status subsequently failed and progressive ischemic necrosis of the right lower extremity now indicated for above-knee amputation.  An experienced assistant was necessary to facilitate exposure of the bone and vascular structures as well as transection of the bone and to close the amputation site.  Findings: All tissue appeared viable and the flaps for reapproximated without tension   Procedure:  The patient was identified in the holding area and taken to the operating room where he was placed supine operative when general anesthesia was induced.  He was sterilely prepped and draped in the right lower extremity usual fashion, antibiotics were up to date and a timeout was called.  We began with fishmouth type incision above the knee dissected down to the bone elevated the periosteum of the bone was transected with soft.  The vascular structures were then clamped and transected and the posterior flap was created with amputation knife.  Vascular structures were suture-ligated with 2-0 Vicryl suture.  The nerves were pulled on tension and tied off with Vicryl suture and divided.  The bone was smoothed with rasp.  The wound was irrigated and hemostasis obtained.  Anterior posterior fascia was closed with interrupted Vicryl suture and the skin was closed with staples.  Sterile dressing was applied.  Patient was then awakened from anesthesia having tolerated procedure without any complication.  All counts were correct at completion.  EBL: 50cc   Alesi Zachery C. Donzetta Matters, MD [facility] Office: [phone] Pager:  [phone]

## 2023-03-09 NOTE — Anesthesia Preprocedure Evaluation (Signed)
Anesthesia Evaluation  Patient identified by MRN, date of birth, ID band Patient awake    Reviewed: Allergy & Precautions, NPO status , Patient's Chart, lab work & pertinent test results  Airway Mallampati: II  TM Distance: >3 FB Neck ROM: Full    Dental  (+) Upper Dentures, Lower Dentures   Pulmonary neg pulmonary ROS, Patient abstained from smoking., former smoker   Pulmonary exam normal        Cardiovascular hypertension, Pt. on medications and Pt. on home beta blockers + Peripheral Vascular Disease   Rhythm:Regular Rate:Normal     Neuro/Psych negative neurological ROS  negative psych ROS   GI/Hepatic Neg liver ROS,GERD  Medicated,,  Endo/Other  negative endocrine ROS    Renal/GU   negative genitourinary   Musculoskeletal negative musculoskeletal ROS (+)    Abdominal Normal abdominal exam  (+)   Peds  Hematology  (+) Blood dyscrasia, anemia   Anesthesia Other Findings   Reproductive/Obstetrics                             Anesthesia Physical Anesthesia Plan  ASA: 3  Anesthesia Plan: General   Post-op Pain Management:    Induction: Intravenous  PONV Risk Score and Plan: 2 and Ondansetron, Dexamethasone and Treatment may vary due to age or medical condition  Airway Management Planned: Mask and LMA  Additional Equipment: None  Intra-op Plan:   Post-operative Plan: Extubation in OR  Informed Consent: I have reviewed the patients History and Physical, chart, labs and discussed the procedure including the risks, benefits and alternatives for the proposed anesthesia with the patient or authorized representative who has indicated his/her understanding and acceptance.     Dental advisory given  Plan Discussed with: CRNA  Anesthesia Plan Comments:        Anesthesia Quick Evaluation

## 2023-03-09 NOTE — Transfer of Care (Signed)
Immediate Anesthesia Transfer of Care Note  Patient: [name]  Procedure(s) Performed: AMPUTATION ABOVE KNEE (Right: Knee)  Patient Location: PACU  Anesthesia Type:General  Level of Consciousness: awake and alert   Airway & Oxygen Therapy: Patient Spontanous Breathing  Post-op Assessment: Report given to RN and Post -op Vital signs reviewed and stable  Post vital signs: Reviewed and stable  Last Vitals:  Vitals Value Taken Time  BP 138/63 03/09/23 1636  Temp    Pulse 97 03/09/23 1638  Resp 23 03/09/23 1638  SpO2 96 % 03/09/23 1638  Vitals shown include unvalidated device data.  Last Pain:  Vitals:   03/09/23 1457  TempSrc:   PainSc: 8       Patients Stated Pain Goal: 1 ([id number])  Complications: No notable events documented.

## 2023-03-10 DIAGNOSIS — I70221 Atherosclerosis of native arteries of extremities with rest pain, right leg: Secondary | ICD-10-CM | POA: Diagnosis not present

## 2023-03-10 DIAGNOSIS — I10 Essential (primary) hypertension: Secondary | ICD-10-CM | POA: Diagnosis not present

## 2023-03-10 DIAGNOSIS — Z8546 Personal history of malignant neoplasm of prostate: Secondary | ICD-10-CM | POA: Diagnosis not present

## 2023-03-10 DIAGNOSIS — D649 Anemia, unspecified: Secondary | ICD-10-CM | POA: Diagnosis not present

## 2023-03-10 LAB — CBC WITH DIFFERENTIAL/PLATELET
Abs Immature Granulocytes: 0.16 10*3/uL — ABNORMAL HIGH (ref 0.00–0.07)
Basophils Absolute: 0 10*3/uL (ref 0.0–0.1)
Basophils Relative: 0 %
Eosinophils Absolute: 0 10*3/uL (ref 0.0–0.5)
Eosinophils Relative: 0 %
HCT: 23.4 % — ABNORMAL LOW (ref 39.0–52.0)
Hemoglobin: 7.7 g/dL — ABNORMAL LOW (ref 13.0–17.0)
Immature Granulocytes: 1 %
Lymphocytes Relative: 3 %
Lymphs Abs: 0.6 10*3/uL — ABNORMAL LOW (ref 0.7–4.0)
MCH: 28.9 pg (ref 26.0–34.0)
MCHC: 32.9 g/dL (ref 30.0–36.0)
MCV: 88 fL (ref 80.0–100.0)
Monocytes Absolute: 0.3 10*3/uL (ref 0.1–1.0)
Monocytes Relative: 2 %
Neutro Abs: 16.4 10*3/uL — ABNORMAL HIGH (ref 1.7–7.7)
Neutrophils Relative %: 94 %
Platelets: 437 10*3/uL — ABNORMAL HIGH (ref 150–400)
RBC: 2.66 MIL/uL — ABNORMAL LOW (ref 4.22–5.81)
RDW: 16.2 % — ABNORMAL HIGH (ref 11.5–15.5)
WBC: 17.4 10*3/uL — ABNORMAL HIGH (ref 4.0–10.5)
nRBC: 0 % (ref 0.0–0.2)

## 2023-03-10 LAB — COMPREHENSIVE METABOLIC PANEL
ALT: 62 U/L — ABNORMAL HIGH (ref 0–44)
AST: 52 U/L — ABNORMAL HIGH (ref 15–41)
Albumin: 1.7 g/dL — ABNORMAL LOW (ref 3.5–5.0)
Alkaline Phosphatase: 105 U/L (ref 38–126)
Anion gap: 12 (ref 5–15)
BUN: 37 mg/dL — ABNORMAL HIGH (ref 8–23)
CO2: 17 mmol/L — ABNORMAL LOW (ref 22–32)
Calcium: 8.9 mg/dL (ref 8.9–10.3)
Chloride: 105 mmol/L (ref 98–111)
Creatinine, Ser: 2.37 mg/dL — ABNORMAL HIGH (ref 0.61–1.24)
GFR, Estimated: 28 mL/min — ABNORMAL LOW (ref >60)
Glucose, Bld: 185 mg/dL — ABNORMAL HIGH (ref 70–99)
Potassium: 5.1 mmol/L (ref 3.5–5.1)
Sodium: 134 mmol/L — ABNORMAL LOW (ref 135–145)
Total Bilirubin: 0.5 mg/dL (ref 0.3–1.2)
Total Protein: 6.1 g/dL — ABNORMAL LOW (ref 6.5–8.1)

## 2023-03-10 LAB — PHOSPHORUS: Phosphorus: 4.3 mg/dL (ref 2.5–4.6)

## 2023-03-10 LAB — CK: Total CK: 910 U/L — ABNORMAL HIGH (ref 49–397)

## 2023-03-10 LAB — MAGNESIUM: Magnesium: 1.8 mg/dL (ref 1.7–2.4)

## 2023-03-10 MED ORDER — FLEET ENEMA 7-19 GM/118ML RE ENEM: 1.0000 | ENEMA | Freq: Every day | RECTAL | Status: DC | PRN

## 2023-03-10 MED ORDER — SODIUM BICARBONATE 650 MG PO TABS
650.0000 mg | ORAL_TABLET | Freq: Three times a day (TID) | ORAL | Status: DC
  Administered 2023-03-10 – 2023-03-13 (×11): 650 mg via ORAL
  Filled 2023-03-10 (×11): qty 1

## 2023-03-10 MED ORDER — HYDROMORPHONE HCL 1 MG/ML IJ SOLN
0.5000 mg | INTRAMUSCULAR | Status: DC | PRN
  Administered 2023-03-10 – 2023-03-11 (×3): 0.5 mg via INTRAVENOUS
  Filled 2023-03-10 (×3): qty 1

## 2023-03-10 MED ORDER — DOCUSATE SODIUM 100 MG PO CAPS
100.0000 mg | ORAL_CAPSULE | Freq: Two times a day (BID) | ORAL | Status: DC | PRN
  Administered 2023-03-10: 100 mg via ORAL
  Filled 2023-03-10: qty 1

## 2023-03-10 MED ORDER — BISACODYL 5 MG PO TBEC
5.0000 mg | DELAYED_RELEASE_TABLET | Freq: Every day | ORAL | Status: DC | PRN
  Administered 2023-03-10: 5 mg via ORAL
  Filled 2023-03-10: qty 1

## 2023-03-10 NOTE — Progress Notes (Signed)
PROGRESS NOTE  [name] [id number] DOB: 01/25/1951   PCP: Iona Beard, MD  Patient is from: Home  DOA: 03/05/2023 LOS: 5  Chief complaints No chief complaint on file.    Brief Narrative / Interim history: 72 year old M with PMH of PAD/nonhealing RLE wound s/p right CFA and PTA bypass in 11/2022, prostate cancer s/p rad, HTN, HLD, GERD and CKD-3A presenting with RLE pain.  Patient has been followed by vascular surgery and wound clinic for RLE wound and wound VAC.  He was found to have thrombosis of the graft on visit from 2/21 where ABI on the right was noted to be 0.  Evaluated by vascular surgery.  SFA occlusion with no tibial runoff on catheterization on 3/11.  Unfortunately, no revascularization option for his RLE.  Underwent right AKA on 3/15. Therapy recommended CIR.   Subjective: Seen and examined earlier this morning.  No major events overnight of this morning.  No complaints.  Pain fairly controlled.  Denies chest pain, dyspnea, dizziness, GI or UTI symptoms.  Objective: Vitals:   03/09/23 2348 03/10/23 0300 03/10/23 0829 03/10/23 0835  BP: (!) 126/47 (!) 123/58 107/68   Pulse: 74 72 96 85  Resp: 18 16 18    Temp: 98.1 F (36.7 C) 98.2 F (36.8 C) 98.2 F (36.8 C)   TempSrc: Oral Oral Oral   SpO2: 100% 96% 96%   Weight:      Height:        Examination:  GENERAL: No apparent distress.  Nontoxic. HEENT: MMM.  Vision and hearing grossly intact.  NECK: Supple.  No apparent JVD.  RESP:  No IWOB.  Fair aeration bilaterally. CVS:  RRR. Heart sounds normal.  ABD/GI/GU: BS+. Abd soft, NTND.  MSK/EXT:   S/p right AKA.  Dressing and Ace wrap DCI. SKIN: Dressing and Ace wrap over right AKA DCI. NEURO: Awake and alert. Oriented appropriately.  No apparent focal neuro deficit. PSYCH: Calm. Normal affect.   Procedures:  3/15-right AKA by Dr. Donzetta Matters  Microbiology summarized: Blood cultures NGTD MRSA PCR screen negative.  Assessment and plan: Principal  Problem:   Critical limb ischemia of right lower extremity (HCC) Active Problems:   Acute kidney injury superimposed on chronic kidney disease (HCC)   Metabolic acidosis, increased anion gap   Hyperkalemia   Normocytic anemia   Essential hypertension   History of prostate cancer   Cellulitis of right lower extremity   Fever   Non-traumatic rhabdomyolysis   Bandemia  Critical ischemia of the right leg: s/p arteriogram that showed SFA occlusion with no tibial runoff. -S/p right AKA by Dr. Donzetta Matters on 3/15 -Pain control per vascular surgery -PT/OT-recommended CIR  AKI on CKD-3A: Renal US with moderate right and mild left hydronephrosis.  UA negative.  CK elevated but improving.  AKI improving. Recent Labs    12/14/22 0126 03/05/23 1008 03/05/23 1031 03/05/23 1038 03/05/23 1846 03/06/23 0139 03/07/23 0142 03/08/23 0137 03/09/23 0224 03/10/23 0045  BUN 33* 128* 127* 136* 122* 113* 77* 50* 38* 37*  CREATININE 1.43* 5.10* 5.10* 4.42* 4.00* 3.56* 3.07* 2.90* 2.57* 2.37*  -Continue Foley catheter. -Continue monitoring -Avoid nephrotoxic meds -Continue trending CK -Voiding trial prior to discharge  Fever/leukocytosis/bandemia: Possible RLE cellulitis.  Now s/p right AKA. Blood cultures NGTD.  No respiratory, GI or UTI symptoms. -Continue IV ceftriaxone 3/15>>   Normocytic anemia: Hgb reached nadir at 5.9 on 3/12.  Unclear cause of this.  No overt bleeding.  Transfused 2 units with appropriate response.  Anemia  panel suggests anemia of chronic disease Recent Labs    12/14/22 0126 03/05/23 1008 03/05/23 1031 03/05/23 1846 03/06/23 0139 03/06/23 2335 03/07/23 0142 03/08/23 0137 03/09/23 0224 03/10/23 0045  HGB 8.0* 7.8* 7.1* 7.0* 5.9* 8.4* 8.2* 8.5* 8.0* 7.7*  -Continue monitoring -Discontinue IV fluid  Hyperkalemia/metabolic acidosis: Resolved  Essential hypertension: Normotensive -Continue metoprolol   Thrombocytosis: Likely reactive.  Stable. Continue to monitor.     Mildly elevated liver enzymes: Likely due to rhabdo.  Improving. -Monitor  Nontraumatic rhabdomyolysis: Likely due to RLE ischemia/gangrene.  Improved.  Metabolic acidosis with normal anion gap: -Discontinue IV sodium bicarbonate -Start oral sodium bicarbonate   Hyperlipidemia:  -Started   H/o Prostate ca: S/p radiation therapy.    Anxiety:  -On ativan prn.    Mild hyponatremia: Continue monitoring    Body mass index is 29.9 kg/m.          DVT prophylaxis:  heparin injection 5,000 Units Start: 03/05/23 2200 SCDs Start: 03/05/23 1136  Code Status: Full code Family Communication: None at bedside Level of care: Telemetry Medical Status is: Inpatient Remains inpatient appropriate because: Critical limb ischemia   Final disposition: CIR Consultants:  Vascular surgery  35 minutes with more than 50% spent in reviewing records, counseling patient/family and coordinating care.   Sch Meds:  Scheduled Meds:  aspirin EC  81 mg Oral Daily   Chlorhexidine Gluconate Cloth  6 each Topical Daily   heparin  5,000 Units Subcutaneous Q8H   metoprolol tartrate  12.5 mg Oral BID   pantoprazole  40 mg Oral Daily   sodium bicarbonate  650 mg Oral TID   sodium chloride flush  3 mL Intravenous Q12H   sodium chloride flush  3 mL Intravenous Q12H   Continuous Infusions:  sodium chloride Stopped (03/06/23 1725)   cefTRIAXone (ROCEPHIN)  IV 2 g (03/10/23 0837)   PRN Meds:.sodium chloride, acetaminophen **OR** acetaminophen, albuterol, bisacodyl, docusate sodium, HYDROmorphone (DILAUDID) injection, labetalol, LORazepam, ondansetron (ZOFRAN) IV, oxyCODONE, sodium chloride flush, sodium phosphate  Antimicrobials: Anti-infectives (From admission, onward)    Start     Dose/Rate Route Frequency Ordered Stop   03/09/23 0800  cefTRIAXone (ROCEPHIN) 2 g in sodium chloride 0.9 % 100 mL IVPB        2 g 200 mL/hr over 30 Minutes Intravenous Every 24 hours 03/09/23 0746 03/16/23 0759         I have personally reviewed the following labs and images: CBC: Recent Labs  Lab 03/06/23 0139 03/06/23 2335 03/07/23 0142 03/08/23 0137 03/09/23 0224 03/10/23 0045  WBC 14.4*  --  16.0* 17.7* 18.4* 17.4*  NEUTROABS  --   --  12.8*  --   --  16.4*  HGB 5.9* 8.4* 8.2* 8.5* 8.0* 7.7*  HCT 17.0* 23.7* 23.9* 25.0* 23.7* 23.4*  MCV 84.6  --  84.2 85.9 86.8 88.0  PLT 490*  --  448* 459* 438* 437*   BMP &GFR Recent Labs  Lab 03/06/23 0139 03/07/23 0142 03/08/23 0137 03/09/23 0224 03/10/23 0045  NA 133* 135 135 132* 134*  K 4.7 4.5 4.5 4.7 5.1  CL 99 99 104 106 105  CO2 23 23 22  18* 17*  GLUCOSE 147* 117* 114* 141* 185*  BUN 113* 77* 50* 38* 37*  CREATININE 3.56* 3.07* 2.90* 2.57* 2.37*  CALCIUM 8.5* 8.5* 8.8* 8.6* 8.9  MG  --   --  2.0 1.7 1.8  PHOS  --   --  3.5 2.7 4.3   Estimated Creatinine Clearance: 34  mL/min (A) (by C-G formula based on SCr of 2.37 mg/dL (H)). Liver & Pancreas: Recent Labs  Lab 03/05/23 1846 03/08/23 0137 03/09/23 0224 03/10/23 0045  AST 64*  --  74* 52*  ALT 93*  --  69* 62*  ALKPHOS 104  --  102 105  BILITOT 0.5  --  0.5 0.5  PROT 7.0  --  6.3* 6.1*  ALBUMIN 2.4* 2.0* 1.9* 1.7*   No results for input(s): "LIPASE", "AMYLASE" in the last 168 hours. No results for input(s): "AMMONIA" in the last 168 hours. Diabetic: No results for input(s): "HGBA1C" in the last 72 hours. No results for input(s): "GLUCAP" in the last 168 hours. Cardiac Enzymes: Recent Labs  Lab 03/09/23 0224 03/10/23 0045  CKTOTAL 1,792* 910*   No results for input(s): "PROBNP" in the last 8760 hours. Coagulation Profile: No results for input(s): "INR", "PROTIME" in the last 168 hours. Thyroid Function Tests: No results for input(s): "TSH", "T4TOTAL", "FREET4", "T3FREE", "THYROIDAB" in the last 72 hours. Lipid Profile: No results for input(s): "CHOL", "HDL", "LDLCALC", "TRIG", "CHOLHDL", "LDLDIRECT" in the last 72 hours. Anemia Panel: No results for  input(s): "VITAMINB12", "FOLATE", "FERRITIN", "TIBC", "IRON", "RETICCTPCT" in the last 72 hours.  Urine analysis:    Component Value Date/Time   COLORURINE YELLOW 03/06/2023 1420   APPEARANCEUR CLEAR 03/06/2023 1420   LABSPEC 1.010 03/06/2023 1420   PHURINE 8.0 03/06/2023 1420   GLUCOSEU NEGATIVE 03/06/2023 1420   HGBUR SMALL (A) 03/06/2023 1420   BILIRUBINUR NEGATIVE 03/06/2023 1420   KETONESUR NEGATIVE 03/06/2023 1420   PROTEINUR NEGATIVE 03/06/2023 1420   NITRITE NEGATIVE 03/06/2023 1420   LEUKOCYTESUR NEGATIVE 03/06/2023 1420   Sepsis Labs: Invalid input(s): "PROCALCITONIN", "LACTICIDVEN"  Microbiology: Recent Results (from the past 240 hour(s))  Surgical pcr screen     Status: None   Collection Time: 03/09/23  5:40 AM   Specimen: Nasal Mucosa; Nasal Swab  Result Value Ref Range Status   MRSA, PCR NEGATIVE NEGATIVE Final   Staphylococcus aureus NEGATIVE NEGATIVE Final    Comment: (NOTE) The Xpert SA Assay (FDA approved for NASAL specimens in patients 30 years of age and older), is one component of a comprehensive surveillance program. It is not intended to diagnose infection nor to guide or monitor treatment. Performed at [facility], [place] [address], [place], [place] [address]   Culture, blood (Routine X 2) w Reflex to ID Panel     Status: None (Preliminary result)   Collection Time: 03/09/23  8:08 AM   Specimen: BLOOD  Result Value Ref Range Status   Specimen Description BLOOD SITE NOT SPECIFIED  Final   Special Requests   Final    BOTTLES DRAWN AEROBIC AND ANAEROBIC Blood Culture adequate volume   Culture   Final    NO GROWTH 1 DAY Performed at [facility], [place] [address], [place], [place] [address]    Report Status PENDING  Incomplete  Culture, blood (Routine X 2) w Reflex to ID Panel     Status: None (Preliminary result)   Collection Time: 03/09/23  8:15 AM   Specimen: BLOOD  Result Value Ref Range Status   Specimen Description BLOOD SITE NOT  SPECIFIED  Final   Special Requests   Final    BOTTLES DRAWN AEROBIC AND ANAEROBIC Blood Culture adequate volume   Culture   Final    NO GROWTH 1 DAY Performed at [facility], [place] [address] [place], [place] [address]    Report  Status PENDING  Incomplete    Radiology Studies: No results found.    Elmo Shumard T. Kingston  If 7PM-7AM, please contact night-coverage [email or web address] 03/10/2023, 12:58 PM

## 2023-03-10 NOTE — Progress Notes (Signed)
Inpatient Rehab Admissions Coordinator:  ? ?Per therapy recommendations,  patient was screened for CIR candidacy by Leanora Murin, MS, CCC-SLP. At this time, Pt. Appears to be a a potential candidate for CIR. I will place   order for rehab consult per protocol for full assessment. Please contact me any with questions. ? ?Carlyle Achenbach, MS, CCC-SLP ?Rehab Admissions Coordinator  ?[phone] (celll) ?[phone] (office) ? ?

## 2023-03-10 NOTE — Anesthesia Postprocedure Evaluation (Signed)
Anesthesia Post Note  Patient: [name]  Procedure(s) Performed: AMPUTATION ABOVE KNEE (Right: Knee)     Patient location during evaluation: PACU Anesthesia Type: General Level of consciousness: awake and alert Pain management: pain level controlled Vital Signs Assessment: post-procedure vital signs reviewed and stable Respiratory status: spontaneous breathing, nonlabored ventilation, respiratory function stable and patient connected to nasal cannula oxygen Cardiovascular status: blood pressure returned to baseline and stable Postop Assessment: no apparent nausea or vomiting Anesthetic complications: no   No notable events documented.  Last Vitals:  Vitals:   03/10/23 0829 03/10/23 0835  BP: 107/68   Pulse: 96 85  Resp: 18   Temp: 36.8 C   SpO2: 96%     Last Pain:  Vitals:   03/10/23 1525  TempSrc:   PainSc: [place]

## 2023-03-10 NOTE — Evaluation (Signed)
Physical Therapy Evaluation Patient Details Name: [name] MRN: [id number] DOB: 1951/12/08 Today's Date: 03/10/2023  History of Present Illness  72 y.o. male presents to [facility] hospital on 03/05/2023 with RLE pain. Pt underwent RLE bypass on 12/12/22, thrombosis of graft on 02/14/23. Pt underwent R AKA on 3/15. PMH includes HTN, HLD, PAD, prostate CA, GERD.  Clinical Impression  Pt presents to PT with deficits in functional mobility, gait, balance, endurance, strength, power. Pt is able to transfer with physical assistance, limited tolerance for hopping at this time due to fatigue. Pt will benefit from frequent mobility in an effort to improve transfer technique and to progress gait and wheelchair training. PT recommends AIR admission at this time as the pt demonstrates the potential to return to independent mobility with high intensity inpatient PT services.      Recommendations for follow up therapy are one component of a multi-disciplinary discharge planning process, led by the attending physician.  Recommendations may be updated based on patient status, additional functional criteria and insurance authorization.  Follow Up Recommendations Acute inpatient rehab (3hours/day) (HHPT if denied AIR, pt adamantly refusing SNF)      Assistance Recommended at Discharge Intermittent Supervision/Assistance  Patient can return home with the following  A lot of help with walking and/or transfers;A little help with bathing/dressing/bathroom;Assistance with cooking/housework;Assist for transportation;Help with stairs or ramp for entrance    Equipment Recommendations Wheelchair (measurements PT)  Recommendations for Other Services  Rehab consult    Functional Status Assessment Patient has had a recent decline in their functional status and demonstrates the ability to make significant improvements in function in a reasonable and predictable amount of time.     Precautions / Restrictions  Precautions Precautions: Fall Restrictions Weight Bearing Restrictions: Yes RLE Weight Bearing: Non weight bearing      Mobility  Bed Mobility                    Transfers Overall transfer level: Needs assistance Equipment used: Rolling walker (2 wheels) Transfers: Sit to/from Stand Sit to Stand: Min assist           General transfer comment: verbal cues for hand placement, scooting forward, anterior lean, physical assist to power up    Ambulation/Gait Ambulation/Gait assistance: Min assist Gait Distance (Feet): 3 Feet Assistive device: Rolling walker (2 wheels) Gait Pattern/deviations:  (hop-to gait) Gait velocity: reuced Gait velocity interpretation: <1.31 ft/sec, indicative of household ambulator   General Gait Details: hop-to gait with reduced L foot clearance  Stairs            Wheelchair Mobility    Modified Rankin (Stroke Patients Only)       Balance Overall balance assessment: Needs assistance Sitting-balance support: No upper extremity supported, Feet supported Sitting balance-Leahy Scale: Good     Standing balance support: Bilateral upper extremity supported, Reliant on assistive device for balance Standing balance-Leahy Scale: Poor                               Pertinent Vitals/Pain Pain Assessment Pain Assessment: Faces Faces Pain Scale: Hurts little more Pain Location: RLE Pain Descriptors / Indicators: Sore Pain Intervention(s): Monitored during session    Home Living Family/patient expects to be discharged to:: Private residence Living Arrangements: Alone Available Help at Discharge: Family;Friend(s);Available PRN/intermittently (unsure of availability of family) Type of Home: House Home Access: Level entry;Stairs to enter Entrance Stairs-Rails: Right;Left Entrance Stairs-Number of Steps:  2   Home Layout: One level Home Equipment: Conservation officer, nature (2 wheels);BSC/3in1 Additional Comments: sister is Therapist, sports, is  unsure how much help he will have initially at d/c.    Prior Function Prior Level of Function : Independent/Modified Independent;Driving             Mobility Comments: reports intermittent use of cane ADLs Comments: ind     Hand Dominance        Extremity/Trunk Assessment   Upper Extremity Assessment Upper Extremity Assessment: Overall WFL for tasks assessed    Lower Extremity Assessment Lower Extremity Assessment: RLE deficits/detail RLE Deficits / Details: ROM WFL, at least 3/5 based on observation of hip mobility    Cervical / Trunk Assessment Cervical / Trunk Assessment: Normal  Communication   Communication: No difficulties  Cognition Arousal/Alertness: Awake/alert Behavior During Therapy: WFL for tasks assessed/performed Overall Cognitive Status: Within Functional Limits for tasks assessed                                          General Comments General comments (skin integrity, edema, etc.): VSS on RA    Exercises     Assessment/Plan    PT Assessment Patient needs continued PT services  PT Problem List Decreased strength;Decreased activity tolerance;Decreased balance;Decreased mobility;Decreased knowledge of use of DME;Decreased safety awareness;Decreased knowledge of precautions       PT Treatment Interventions DME instruction;Gait training;Functional mobility training;Therapeutic activities;Therapeutic exercise;Balance training;Neuromuscular re-education;Patient/family education;Wheelchair mobility training    PT Goals (Current goals can be found in the Care Plan section)  Acute Rehab PT Goals Patient Stated Goal: to return to independence PT Goal Formulation: With patient Time For Goal Achievement: 03/24/23 Potential to Achieve Goals: Good    Frequency Min 3X/week     Co-evaluation               AM-PAC PT "6 Clicks" Mobility  Outcome Measure Help needed turning from your back to your side while in a flat bed without  using bedrails?: A Little Help needed moving from lying on your back to sitting on the side of a flat bed without using bedrails?: A Little Help needed moving to and from a bed to a chair (including a wheelchair)?: A Little Help needed standing up from a chair using your arms (e.g., wheelchair or bedside chair)?: A Little Help needed to walk in hospital room?: A Lot Help needed climbing 3-5 steps with a railing? : Total 6 Click Score: 15    End of Session   Activity Tolerance: Patient tolerated treatment well Patient left: in chair;with call bell/phone within reach;with chair alarm set Nurse Communication: Mobility status PT Visit Diagnosis: Other abnormalities of gait and mobility (R26.89)    Time: [id number] PT Time Calculation (min) (ACUTE ONLY): 39 min   Charges:   PT Evaluation $PT Eval Low Complexity: [place], PT, DPT Acute Rehabilitation Office [place] 03/10/2023, 12:46 PM

## 2023-03-10 NOTE — Progress Notes (Signed)
Mobility Specialist Progress Note:   03/10/23 1500  Mobility  Activity Transferred to/from [facility];Transferred from chair to bed  Level of Assistance Moderate assist, patient does 50-74%  Assistive Device Front wheel walker;BSC  Distance Ambulated (ft) 6 ft  RLE Weight Bearing NWB  Activity Response Tolerated well  Mobility Referral Yes  $Mobility charge 1 Mobility   Pt requesting to transfer to [facility], then back to bed. Required heavy modA to stand from low recliner and BSC. CGA to ambulate with a hop-to pattern. BM unsuccessful. Pt back in bed with all needs met.  Nelta Numbers Mobility Specialist Please contact via SecureChat or  Rehab office at [phone]

## 2023-03-10 NOTE — Evaluation (Signed)
Occupational Therapy Evaluation Patient Details Name: [name] MRN: [id number] DOB: Jan 22, 1951 Today's Date: 03/10/2023   History of Present Illness 72 y.o. male presents to [facility] hospital on 03/05/2023 with RLE pain. Pt underwent RLE bypass on 12/12/22, thrombosis of graft on 02/14/23. Pt underwent R AKA on 3/15. PMH includes HTN, HLD, PAD, prostate CA, GERD.   Clinical Impression   Pt reports independence at baseline with ADLs and functional mobility, lives alone and states sister (who is Therapist, sports) may be able to assist at d/c. Pt currently needing min-max A for ADLs, min A for bed mobility, and mod A for transfers with RW, cues for hand placement. Began educated on limb desensitization strategies, however pt emotional regarding his surgery/loss of limb, will continue education as appropriate. Pt presenting with impairments listed below, will follow acutely. Recommend AIR at d/c.     Recommendations for follow up therapy are one component of a multi-disciplinary discharge planning process, led by the attending physician.  Recommendations may be updated based on patient status, additional functional criteria and insurance authorization.   Follow Up Recommendations  Acute inpatient rehab (3hours/day)     Assistance Recommended at Discharge Frequent or constant Supervision/Assistance  Patient can return home with the following A lot of help with walking and/or transfers;A lot of help with bathing/dressing/bathroom;Assistance with cooking/housework;Direct supervision/assist for medications management;Direct supervision/assist for financial management;Assist for transportation;Help with stairs or ramp for entrance    Functional Status Assessment  Patient has had a recent decline in their functional status and demonstrates the ability to make significant improvements in function in a reasonable and predictable amount of time.  Equipment Recommendations  Tub/shower bench;Wheelchair (measurements  OT);Wheelchair cushion (measurements OT)    Recommendations for Other Services PT consult;Rehab consult     Precautions / Restrictions Precautions Precautions: Fall Restrictions Weight Bearing Restrictions: Yes RLE Weight Bearing: Non weight bearing      Mobility Bed Mobility Overal bed mobility: Needs Assistance Bed Mobility: Supine to Sit     Supine to sit: Min assist          Transfers Overall transfer level: Needs assistance Equipment used: Rolling walker (2 wheels) Transfers: Sit to/from Stand Sit to Stand: Mod assist           General transfer comment: mod A from low BSC surface, cues for hand placement      Balance Overall balance assessment: Needs assistance Sitting-balance support: No upper extremity supported, Feet supported Sitting balance-Leahy Scale: Good     Standing balance support: Bilateral upper extremity supported, Reliant on assistive device for balance Standing balance-Leahy Scale: Poor                             ADL either performed or assessed with clinical judgement   ADL Overall ADL's : Needs assistance/impaired Eating/Feeding: Set up   Grooming: Set up;Sitting   Upper Body Bathing: Min guard;Sitting   Lower Body Bathing: Moderate assistance;Sitting/lateral leans   Upper Body Dressing : Minimal assistance;Sitting   Lower Body Dressing: Moderate assistance;Sitting/lateral leans;Bed level   Toilet Transfer: Moderate assistance;Stand-pivot;BSC/3in1;Rolling walker (2 wheels)   Toileting- Clothing Manipulation and Hygiene: Maximal assistance       Functional mobility during ADLs: Moderate assistance;Rolling walker (2 wheels)       Vision   Vision Assessment?: No apparent visual deficits     Perception Perception Perception Tested?: No   Praxis Praxis Praxis tested?: Not tested    Pertinent Vitals/Pain  Pain Assessment Pain Assessment: Faces Pain Score: 4  Faces Pain Scale: Hurts even more Pain  Location: RLE Pain Descriptors / Indicators: Sore Pain Intervention(s): Limited activity within patient's tolerance, Repositioned, Monitored during session     Hand Dominance     Extremity/Trunk Assessment Upper Extremity Assessment Upper Extremity Assessment: Overall WFL for tasks assessed   Lower Extremity Assessment Lower Extremity Assessment: Defer to PT evaluation RLE Deficits / Details: ROM WFL, at least 3/5 based on observation of hip mobility   Cervical / Trunk Assessment Cervical / Trunk Assessment: Normal   Communication Communication Communication: No difficulties   Cognition Arousal/Alertness: Awake/alert Behavior During Therapy: WFL for tasks assessed/performed Overall Cognitive Status: Within Functional Limits for tasks assessed                                 General Comments: at times tearful/emotional in regards to recent loss of limb     General Comments  VSSon RA    Exercises     Shoulder Instructions      Home Living Family/patient expects to be discharged to:: Private residence Living Arrangements: Alone Available Help at Discharge: Family;Friend(s);Available PRN/intermittently (church family) Type of Home: House Home Access: Level entry;Stairs to enter Entrance Stairs-Number of Steps: 2 Entrance Stairs-Rails: Right;Left Home Layout: One level     Bathroom Shower/Tub: Walk-in shower;Tub/shower unit (walk in shower is in bedroom, tub shower in hall bath)   Bathroom Toilet: Standard (has high commode in hall bathroom)     Home Equipment: Public relations account executive (2 wheels);Kasandra Knudsen - single point (family is working on getting pt a hospital bed)   Additional Comments: sister is Therapist, sports, is unsure how much help he will have initially at d/c.      Prior Functioning/Environment Prior Level of Function : Independent/Modified Independent;Driving             Mobility Comments: reports intermittent use of cane ADLs Comments: ind         OT Problem List: Decreased strength;Decreased range of motion;Decreased activity tolerance;Impaired balance (sitting and/or standing);Decreased coordination;Decreased safety awareness      OT Treatment/Interventions: Self-care/ADL training;Therapeutic exercise;DME and/or AE instruction;Energy conservation;Therapeutic activities;Balance training;Patient/family education    OT Goals(Current goals can be found in the care plan section) Acute Rehab OT Goals Patient Stated Goal: none stated OT Goal Formulation: With patient Time For Goal Achievement: 03/24/23 Potential to Achieve Goals: Good ADL Goals Pt Will Perform Lower Body Dressing: with supervision;with adaptive equipment;sitting/lateral leans;bed level Pt Will Transfer to Toilet: stand pivot transfer;squat pivot transfer;with min guard assist;bedside commode Pt Will Perform Tub/Shower Transfer: Tub transfer;Shower transfer;tub bench;with min assist;Squat pivot transfer;Stand pivot transfer;rolling walker  OT Frequency: Min 2X/week    Co-evaluation              AM-PAC OT "6 Clicks" Daily Activity     Outcome Measure Help from another person eating meals?: None Help from another person taking care of personal grooming?: A Little Help from another person toileting, which includes using toliet, bedpan, or urinal?: A Lot Help from another person bathing (including washing, rinsing, drying)?: A Lot Help from another person to put on and taking off regular upper body clothing?: A Little Help from another person to put on and taking off regular lower body clothing?: A Lot 6 Click Score: 16   End of Session Equipment Utilized During Treatment: Gait belt;Rolling walker (2 wheels) Nurse Communication: Mobility status;Patient requests pain meds  Activity Tolerance: Patient tolerated treatment well Patient left: in chair;with call bell/phone within reach;with chair alarm set  OT Visit Diagnosis: Unsteadiness on feet (R26.81);Other  abnormalities of gait and mobility (R26.89);Muscle weakness (generalized) (M62.81)                Time: [id number] OT Time Calculation (min): 54 min Charges:  OT General Charges $OT Visit: 1 Visit OT Evaluation $OT Eval Moderate Complexity: 1 Mod OT Treatments $Self Care/Home Management : 38-52 mins  Renaye Rakers, OTD, OTR/L SecureChat Preferred Acute Rehab [phone]   Renaye Rakers Koonce 03/10/2023, 2:40 PM

## 2023-03-10 NOTE — Progress Notes (Signed)
  Progress Note    03/10/2023 7:57 AM 1 Day Post-Op  Subjective: Still feels like he can feel his right foot  Vitals:   03/09/23 2348 03/10/23 0300  BP: (!) 126/47 (!) 123/58  Pulse: 74 72  Resp: 18 16  Temp: 98.1 F (36.7 C) 98.2 F (36.8 C)  SpO2: 100% 96%    Physical Exam: Awake alert and oriented Dressing on right AKA residual limb clean dry and intact  CBC    Component Value Date/Time   WBC 17.4 (H) 03/10/2023 0045   RBC 2.66 (L) 03/10/2023 0045   HGB 7.7 (L) 03/10/2023 0045   HCT 23.4 (L) 03/10/2023 0045   PLT 437 (H) 03/10/2023 0045   MCV 88.0 03/10/2023 0045   MCH 28.9 03/10/2023 0045   MCHC 32.9 03/10/2023 0045   RDW 16.2 (H) 03/10/2023 0045   LYMPHSABS 0.6 (L) 03/10/2023 0045   MONOABS 0.3 03/10/2023 0045   EOSABS 0.0 03/10/2023 0045   BASOSABS 0.0 03/10/2023 0045    BMET    Component Value Date/Time   NA 134 (L) 03/10/2023 0045   K 5.1 03/10/2023 0045   CL 105 03/10/2023 0045   CO2 17 (L) 03/10/2023 0045   GLUCOSE 185 (H) 03/10/2023 0045   BUN 37 (H) 03/10/2023 0045   CREATININE 2.37 (H) 03/10/2023 0045   CALCIUM 8.9 03/10/2023 0045   GFRNONAA 28 (L) 03/10/2023 0045   GFRAA (L) 03/18/2010 0457    60        The eGFR has been calculated using the MDRD equation. This calculation has not been validated in all clinical situations. eGFR's persistently <60 mL/min signify possible Chronic Kidney Disease.    INR    Component Value Date/Time   INR 1.0 12/06/2022 1040     Intake/Output Summary (Last 24 hours) at 03/10/2023 0757 Last data filed at 03/10/2023 0331 Gross per 24 hour  Intake 1094.79 ml  Output 250 ml  Net 844.79 ml     Assessment:  72 y.o. male is s/p right above-knee amputation, H&H stabilized and creatinine continues to improve 1 Day Post-Op  Plan: Dressing down tomorrow   Aniylah Avans C. Donzetta Matters, MD [facility] [place] Office: [phone] Pager: [phone]  03/10/2023 7:57 AM

## 2023-03-11 ENCOUNTER — Encounter (HOSPITAL_COMMUNITY): Payer: Self-pay | Admitting: Vascular Surgery

## 2023-03-11 DIAGNOSIS — I10 Essential (primary) hypertension: Secondary | ICD-10-CM | POA: Diagnosis not present

## 2023-03-11 DIAGNOSIS — D649 Anemia, unspecified: Secondary | ICD-10-CM | POA: Diagnosis not present

## 2023-03-11 DIAGNOSIS — I70221 Atherosclerosis of native arteries of extremities with rest pain, right leg: Secondary | ICD-10-CM | POA: Diagnosis not present

## 2023-03-11 DIAGNOSIS — Z8546 Personal history of malignant neoplasm of prostate: Secondary | ICD-10-CM | POA: Diagnosis not present

## 2023-03-11 LAB — COMPREHENSIVE METABOLIC PANEL
ALT: 71 U/L — ABNORMAL HIGH (ref 0–44)
AST: 57 U/L — ABNORMAL HIGH (ref 15–41)
Albumin: 1.8 g/dL — ABNORMAL LOW (ref 3.5–5.0)
Alkaline Phosphatase: 108 U/L (ref 38–126)
Anion gap: 11 (ref 5–15)
BUN: 40 mg/dL — ABNORMAL HIGH (ref 8–23)
CO2: 20 mmol/L — ABNORMAL LOW (ref 22–32)
Calcium: 9 mg/dL (ref 8.9–10.3)
Chloride: 105 mmol/L (ref 98–111)
Creatinine, Ser: 2.23 mg/dL — ABNORMAL HIGH (ref 0.61–1.24)
GFR, Estimated: 30 mL/min — ABNORMAL LOW (ref >60)
Glucose, Bld: 135 mg/dL — ABNORMAL HIGH (ref 70–99)
Potassium: 4.6 mmol/L (ref 3.5–5.1)
Sodium: 136 mmol/L (ref 135–145)
Total Bilirubin: 0.2 mg/dL — ABNORMAL LOW (ref 0.3–1.2)
Total Protein: 6.2 g/dL — ABNORMAL LOW (ref 6.5–8.1)

## 2023-03-11 LAB — CBC WITH DIFFERENTIAL/PLATELET
Abs Immature Granulocytes: 0.09 10*3/uL — ABNORMAL HIGH (ref 0.00–0.07)
Basophils Absolute: 0 10*3/uL (ref 0.0–0.1)
Basophils Relative: 0 %
Eosinophils Absolute: 0.1 10*3/uL (ref 0.0–0.5)
Eosinophils Relative: 0 %
HCT: 23 % — ABNORMAL LOW (ref 39.0–52.0)
Hemoglobin: 7.4 g/dL — ABNORMAL LOW (ref 13.0–17.0)
Immature Granulocytes: 1 %
Lymphocytes Relative: 10 %
Lymphs Abs: 1.7 10*3/uL (ref 0.7–4.0)
MCH: 28.9 pg (ref 26.0–34.0)
MCHC: 32.2 g/dL (ref 30.0–36.0)
MCV: 89.8 fL (ref 80.0–100.0)
Monocytes Absolute: 1 10*3/uL (ref 0.1–1.0)
Monocytes Relative: 6 %
Neutro Abs: 13.8 10*3/uL — ABNORMAL HIGH (ref 1.7–7.7)
Neutrophils Relative %: 83 %
Platelets: 451 10*3/uL — ABNORMAL HIGH (ref 150–400)
RBC: 2.56 MIL/uL — ABNORMAL LOW (ref 4.22–5.81)
RDW: 16.2 % — ABNORMAL HIGH (ref 11.5–15.5)
WBC: 16.8 10*3/uL — ABNORMAL HIGH (ref 4.0–10.5)
nRBC: 0 % (ref 0.0–0.2)

## 2023-03-11 LAB — MAGNESIUM: Magnesium: 1.7 mg/dL (ref 1.7–2.4)

## 2023-03-11 LAB — PREPARE RBC (CROSSMATCH)

## 2023-03-11 LAB — HEMOGLOBIN AND HEMATOCRIT, BLOOD
HCT: 27.4 % — ABNORMAL LOW (ref 39.0–52.0)
Hemoglobin: 9.2 g/dL — ABNORMAL LOW (ref 13.0–17.0)

## 2023-03-11 LAB — CK: Total CK: 516 U/L — ABNORMAL HIGH (ref 49–397)

## 2023-03-11 LAB — PHOSPHORUS: Phosphorus: 2.8 mg/dL (ref 2.5–4.6)

## 2023-03-11 MED ORDER — SODIUM CHLORIDE 0.9% IV SOLUTION: Freq: Once | INTRAVENOUS | Status: AC

## 2023-03-11 NOTE — Progress Notes (Signed)
  Progress Note    03/11/2023 10:28 AM 2 Days Post-Op  Subjective: Pain is well-controlled, feels like he can feel his right foot dangling over the bedside  Vitals:   03/11/23 0928 03/11/23 1004  BP: 121/63 (!) 115/53  Pulse: 80 77  Resp: 20 18  Temp: 98.1 F (36.7 C) 98.1 F (36.7 C)  SpO2: 97% 99%    Physical Exam: Awake alert and oriented Right above-knee amputation site clean dry and intact with staples  CBC    Component Value Date/Time   WBC 16.8 (H) 03/11/2023 0208   RBC 2.56 (L) 03/11/2023 0208   HGB 7.4 (L) 03/11/2023 0208   HCT 23.0 (L) 03/11/2023 0208   PLT 451 (H) 03/11/2023 0208   MCV 89.8 03/11/2023 0208   MCH 28.9 03/11/2023 0208   MCHC 32.2 03/11/2023 0208   RDW 16.2 (H) 03/11/2023 0208   LYMPHSABS 1.7 03/11/2023 0208   MONOABS 1.0 03/11/2023 0208   EOSABS 0.1 03/11/2023 0208   BASOSABS 0.0 03/11/2023 0208    BMET    Component Value Date/Time   NA 136 03/11/2023 0208   K 4.6 03/11/2023 0208   CL 105 03/11/2023 0208   CO2 20 (L) 03/11/2023 0208   GLUCOSE 135 (H) 03/11/2023 0208   BUN 40 (H) 03/11/2023 0208   CREATININE 2.23 (H) 03/11/2023 0208   CALCIUM 9.0 03/11/2023 0208   GFRNONAA 30 (L) 03/11/2023 0208   GFRAA (L) 03/18/2010 0457    60        The eGFR has been calculated using the MDRD equation. This calculation has not been validated in all clinical situations. eGFR's persistently <60 mL/min signify possible Chronic Kidney Disease.    INR    Component Value Date/Time   INR 1.0 12/06/2022 1040     Intake/Output Summary (Last 24 hours) at 03/11/2023 1028 Last data filed at 03/11/2023 0430 Gross per 24 hour  Intake 360 ml  Output 700 ml  Net -340 ml     Assessment:  72 y.o. male is s/p right above-knee amputation 2 Days Post-Op  Plan: Okay to redress right above-knee amputation wound  Pending inpatient rehab  Vascular will be available as needed   Nikky Duba C. Donzetta Matters, MD [facility]  [place] Office: [phone] Pager: [phone]  03/11/2023 10:28 AM

## 2023-03-11 NOTE — Progress Notes (Signed)
PROGRESS NOTE  [name] [id number] DOB: June 04, 1951   PCP: Iona Beard, MD  Patient is from: Home  DOA: 03/05/2023 LOS: 6  Chief complaints No chief complaint on file.    Brief Narrative / Interim history: 72 year old M with PMH of PAD/nonhealing RLE wound s/p right CFA and PTA bypass in 11/2022, prostate cancer s/p rad, HTN, HLD, GERD and CKD-3A presenting with RLE pain.  Patient has been followed by vascular surgery and wound clinic for RLE wound and wound VAC.  He was found to have thrombosis of the graft on visit from 2/21 where ABI on the right was noted to be 0.  Evaluated by vascular surgery.  SFA occlusion with no tibial runoff on catheterization on 3/11.  Unfortunately, no revascularization option for his RLE.  Underwent right AKA on 3/15. Therapy recommended CIR. CIR following.   Subjective: Seen and examined earlier this morning.  No major events overnight of this morning.  No complaints.  Hemoglobin down to 7.4.  Likely due to acute illness, blood draws and expected surgical loss.  No overt bleeding.  Offered 1 unit of blood for transfusion, and patient is in agreement.  Objective: Vitals:   03/11/23 0805 03/11/23 0928 03/11/23 1004 03/11/23 1202  BP: 122/74 121/63 (!) 115/53 (!) 114/59  Pulse: 87 80 77 88  Resp: 19 20 18 19   Temp: 98.2 F (36.8 C) 98.1 F (36.7 C) 98.1 F (36.7 C)   TempSrc: Oral Oral    SpO2: 96% 97% 99% 96%  Weight:      Height:        Examination:  GENERAL: No apparent distress.  Nontoxic. HEENT: MMM.  Vision and hearing grossly intact.  NECK: Supple.  No apparent JVD.  RESP:  No IWOB.  Fair aeration bilaterally. CVS:  RRR. Heart sounds normal.  ABD/GI/GU: BS+. Abd soft, NTND.  MSK/EXT:   New right AKA.  Dressing and Ace wrap DCI. SKIN: As above. NEURO: Awake and alert. Oriented appropriately.  No apparent focal neuro deficit. PSYCH: Calm. Normal affect.   Procedures:  3/15-right AKA by Dr. Donzetta Matters  Microbiology  summarized: Blood cultures NGTD MRSA PCR screen negative.  Assessment and plan: Principal Problem:   Critical limb ischemia of right lower extremity (HCC) Active Problems:   Acute kidney injury superimposed on chronic kidney disease (HCC)   Metabolic acidosis, increased anion gap   Hyperkalemia   Normocytic anemia   Essential hypertension   History of prostate cancer   Cellulitis of right lower extremity   Fever   Non-traumatic rhabdomyolysis   Bandemia  Critical ischemia of the right leg: s/p arteriogram that showed SFA occlusion with no tibial runoff. -S/p right AKA by Dr. Donzetta Matters on 3/15 -Pain control per vascular surgery -PT/OT-recommended CIR  AKI on CKD-3A: Renal US with moderate right and mild left hydronephrosis.  UA negative.  CK elevated but improving.  AKI improving.  Passed voiding trial on 3/16. Recent Labs    03/05/23 1008 03/05/23 1031 03/05/23 1038 03/05/23 1846 03/06/23 0139 03/07/23 0142 03/08/23 0137 03/09/23 0224 03/10/23 0045 03/11/23 0208  BUN 128* 127* 136* 122* 113* 77* 50* 38* 37* 40*  CREATININE 5.10* 5.10* 4.42* 4.00* 3.56* 3.07* 2.90* 2.57* 2.37* 2.23*  -Continue monitoring -Avoid nephrotoxic meds -Continue trending CK  Fever/leukocytosis/bandemia: Possible RLE cellulitis.  Now s/p right AKA. Blood cultures NGTD.  Still with some leukocytosis which could also be demargination.  -Continue IV ceftriaxone 3/15>> for a total of 5 days   Normocytic anemia: Hgb  reached nadir at 5.9 on 3/12.  Unclear cause of this.  No overt bleeding.  Transfused 2 units with appropriate response on 3/12.  Anemia panel suggests anemia of chronic disease Recent Labs    03/05/23 1008 03/05/23 1031 03/05/23 1846 03/06/23 0139 03/06/23 2335 03/07/23 0142 03/08/23 0137 03/09/23 0224 03/10/23 0045 03/11/23 0208  HGB 7.8* 7.1* 7.0* 5.9* 8.4* 8.2* 8.5* 8.0* 7.7* 7.4*  -Transfuse additional 1 unit -Monitor H&H  Hyperkalemia/metabolic acidosis:  Resolved  Essential hypertension: Normotensive -Continue metoprolol   Thrombocytosis: Likely reactive.  Stable. Continue to monitor.    Mildly elevated liver enzymes: Likely due to rhabdo.  Improving. -Monitor  Nontraumatic rhabdomyolysis: Likely due to RLE ischemia/gangrene.  Improved.  Metabolic acidosis with normal anion gap: Improved. -Continue p.o. sodium bicarbonate   Hyperlipidemia:  -Started   H/o Prostate ca: S/p radiation therapy.    Anxiety:  -On ativan prn.    Mild hyponatremia: Continue monitoring    Body mass index is 29.9 kg/m.          DVT prophylaxis:  heparin injection 5,000 Units Start: 03/05/23 2200 SCDs Start: 03/05/23 1136  Code Status: Full code Family Communication: None at bedside Level of care: Telemetry Medical Status is: Inpatient Remains inpatient appropriate because: Placement/CIR.   Final disposition: CIR Consultants:  Vascular surgery  35 minutes with more than 50% spent in reviewing records, counseling patient/family and coordinating care.   Sch Meds:  Scheduled Meds:  aspirin EC  81 mg Oral Daily   heparin  5,000 Units Subcutaneous Q8H   metoprolol tartrate  12.5 mg Oral BID   pantoprazole  40 mg Oral Daily   sodium bicarbonate  650 mg Oral TID   sodium chloride flush  3 mL Intravenous Q12H   sodium chloride flush  3 mL Intravenous Q12H   Continuous Infusions:  sodium chloride Stopped (03/06/23 1725)   cefTRIAXone (ROCEPHIN)  IV 2 g (03/11/23 0812)   PRN Meds:.sodium chloride, acetaminophen **OR** acetaminophen, albuterol, bisacodyl, docusate sodium, HYDROmorphone (DILAUDID) injection, labetalol, LORazepam, ondansetron (ZOFRAN) IV, oxyCODONE, sodium chloride flush, sodium phosphate  Antimicrobials: Anti-infectives (From admission, onward)    Start     Dose/Rate Route Frequency Ordered Stop   03/09/23 0800  cefTRIAXone (ROCEPHIN) 2 g in sodium chloride 0.9 % 100 mL IVPB        2 g 200 mL/hr over 30 Minutes  Intravenous Every 24 hours 03/09/23 0746 03/16/23 0759        I have personally reviewed the following labs and images: CBC: Recent Labs  Lab 03/07/23 0142 03/08/23 0137 03/09/23 0224 03/10/23 0045 03/11/23 0208  WBC 16.0* 17.7* 18.4* 17.4* 16.8*  NEUTROABS 12.8*  --   --  16.4* 13.8*  HGB 8.2* 8.5* 8.0* 7.7* 7.4*  HCT 23.9* 25.0* 23.7* 23.4* 23.0*  MCV 84.2 85.9 86.8 88.0 89.8  PLT 448* 459* 438* 437* 451*   BMP &GFR Recent Labs  Lab 03/07/23 0142 03/08/23 0137 03/09/23 0224 03/10/23 0045 03/11/23 0208  NA 135 135 132* 134* 136  K 4.5 4.5 4.7 5.1 4.6  CL 99 104 106 105 105  CO2 23 22 18* 17* 20*  GLUCOSE 117* 114* 141* 185* 135*  BUN 77* 50* 38* 37* 40*  CREATININE 3.07* 2.90* 2.57* 2.37* 2.23*  CALCIUM 8.5* 8.8* 8.6* 8.9 9.0  MG  --  2.0 1.7 1.8 1.7  PHOS  --  3.5 2.7 4.3 2.8   Estimated Creatinine Clearance: 36.1 mL/min (A) (by C-G formula based on SCr of 2.23  mg/dL (H)). Liver & Pancreas: Recent Labs  Lab 03/05/23 1846 03/08/23 0137 03/09/23 0224 03/10/23 0045 03/11/23 0208  AST 64*  --  74* 52* 57*  ALT 93*  --  69* 62* 71*  ALKPHOS 104  --  102 105 108  BILITOT 0.5  --  0.5 0.5 0.2*  PROT 7.0  --  6.3* 6.1* 6.2*  ALBUMIN 2.4* 2.0* 1.9* 1.7* 1.8*   No results for input(s): "LIPASE", "AMYLASE" in the last 168 hours. No results for input(s): "AMMONIA" in the last 168 hours. Diabetic: No results for input(s): "HGBA1C" in the last 72 hours. No results for input(s): "GLUCAP" in the last 168 hours. Cardiac Enzymes: Recent Labs  Lab 03/09/23 0224 03/10/23 0045 03/11/23 0208  CKTOTAL 1,792* 910* 516*   No results for input(s): "PROBNP" in the last 8760 hours. Coagulation Profile: No results for input(s): "INR", "PROTIME" in the last 168 hours. Thyroid Function Tests: No results for input(s): "TSH", "T4TOTAL", "FREET4", "T3FREE", "THYROIDAB" in the last 72 hours. Lipid Profile: No results for input(s): "CHOL", "HDL", "LDLCALC", "TRIG",  "CHOLHDL", "LDLDIRECT" in the last 72 hours. Anemia Panel: No results for input(s): "VITAMINB12", "FOLATE", "FERRITIN", "TIBC", "IRON", "RETICCTPCT" in the last 72 hours.  Urine analysis:    Component Value Date/Time   COLORURINE YELLOW 03/06/2023 1420   APPEARANCEUR CLEAR 03/06/2023 1420   LABSPEC 1.010 03/06/2023 1420   PHURINE 8.0 03/06/2023 1420   GLUCOSEU NEGATIVE 03/06/2023 1420   HGBUR SMALL (A) 03/06/2023 1420   BILIRUBINUR NEGATIVE 03/06/2023 1420   KETONESUR NEGATIVE 03/06/2023 1420   PROTEINUR NEGATIVE 03/06/2023 1420   NITRITE NEGATIVE 03/06/2023 1420   LEUKOCYTESUR NEGATIVE 03/06/2023 1420   Sepsis Labs: Invalid input(s): "PROCALCITONIN", "LACTICIDVEN"  Microbiology: Recent Results (from the past 240 hour(s))  Surgical pcr screen     Status: None   Collection Time: 03/09/23  5:40 AM   Specimen: Nasal Mucosa; Nasal Swab  Result Value Ref Range Status   MRSA, PCR NEGATIVE NEGATIVE Final   Staphylococcus aureus NEGATIVE NEGATIVE Final    Comment: (NOTE) The Xpert SA Assay (FDA approved for NASAL specimens in patients 81 years of age and older), is one component of a comprehensive surveillance program. It is not intended to diagnose infection nor to guide or monitor treatment. Performed at [facility], [place] [address], [place], [place] [address]   Culture, blood (Routine X 2) w Reflex to ID Panel     Status: None (Preliminary result)   Collection Time: 03/09/23  8:08 AM   Specimen: BLOOD  Result Value Ref Range Status   Specimen Description BLOOD SITE NOT SPECIFIED  Final   Special Requests   Final    BOTTLES DRAWN AEROBIC AND ANAEROBIC Blood Culture adequate volume   Culture   Final    NO GROWTH 2 DAYS Performed at [facility], [place] [address], [place], [place] [address]    Report Status PENDING  Incomplete  Culture, blood (Routine X 2) w Reflex to ID Panel     Status: None (Preliminary result)   Collection Time: 03/09/23  8:15 AM   Specimen:  BLOOD  Result Value Ref Range Status   Specimen Description BLOOD SITE NOT SPECIFIED  Final   Special Requests   Final    BOTTLES DRAWN AEROBIC AND ANAEROBIC Blood Culture adequate volume   Culture   Final    NO GROWTH 2 DAYS Performed at [facility], [address] [place],  [address]  Report Status PENDING  Incomplete    Radiology Studies: No results found.    Princetta Uplinger T. Paisley  If 7PM-7AM, please contact night-coverage [email or web address] 03/11/2023, 1:21 PM

## 2023-03-12 DIAGNOSIS — G8918 Other acute postprocedural pain: Secondary | ICD-10-CM | POA: Diagnosis not present

## 2023-03-12 DIAGNOSIS — I70221 Atherosclerosis of native arteries of extremities with rest pain, right leg: Secondary | ICD-10-CM | POA: Diagnosis not present

## 2023-03-12 DIAGNOSIS — I10 Essential (primary) hypertension: Secondary | ICD-10-CM | POA: Diagnosis not present

## 2023-03-12 DIAGNOSIS — D649 Anemia, unspecified: Secondary | ICD-10-CM | POA: Diagnosis not present

## 2023-03-12 DIAGNOSIS — Z89611 Acquired absence of right leg above knee: Secondary | ICD-10-CM

## 2023-03-12 DIAGNOSIS — Z8546 Personal history of malignant neoplasm of prostate: Secondary | ICD-10-CM | POA: Diagnosis not present

## 2023-03-12 LAB — CBC
HCT: 27.8 % — ABNORMAL LOW (ref 39.0–52.0)
Hemoglobin: 9 g/dL — ABNORMAL LOW (ref 13.0–17.0)
MCH: 28.8 pg (ref 26.0–34.0)
MCHC: 32.4 g/dL (ref 30.0–36.0)
MCV: 89.1 fL (ref 80.0–100.0)
Platelets: 442 10*3/uL — ABNORMAL HIGH (ref 150–400)
RBC: 3.12 MIL/uL — ABNORMAL LOW (ref 4.22–5.81)
RDW: 16 % — ABNORMAL HIGH (ref 11.5–15.5)
WBC: 10.9 10*3/uL — ABNORMAL HIGH (ref 4.0–10.5)
nRBC: 0 % (ref 0.0–0.2)

## 2023-03-12 LAB — TYPE AND SCREEN
ABO/RH(D): O POS
Antibody Screen: NEGATIVE
Unit division: 0

## 2023-03-12 LAB — RENAL FUNCTION PANEL
Albumin: 1.9 g/dL — ABNORMAL LOW (ref 3.5–5.0)
Anion gap: 9 (ref 5–15)
BUN: 35 mg/dL — ABNORMAL HIGH (ref 8–23)
CO2: 20 mmol/L — ABNORMAL LOW (ref 22–32)
Calcium: 9 mg/dL (ref 8.9–10.3)
Chloride: 107 mmol/L (ref 98–111)
Creatinine, Ser: 1.8 mg/dL — ABNORMAL HIGH (ref 0.61–1.24)
GFR, Estimated: 39 mL/min — ABNORMAL LOW (ref >60)
Glucose, Bld: 114 mg/dL — ABNORMAL HIGH (ref 70–99)
Phosphorus: 3.3 mg/dL (ref 2.5–4.6)
Potassium: 4.6 mmol/L (ref 3.5–5.1)
Sodium: 136 mmol/L (ref 135–145)

## 2023-03-12 LAB — BPAM RBC
Blood Product Expiration Date: 202404022359
ISSUE DATE / TIME: 202403170937
Unit Type and Rh: 5100

## 2023-03-12 LAB — MAGNESIUM: Magnesium: 1.9 mg/dL (ref 1.7–2.4)

## 2023-03-12 NOTE — Discharge Instructions (Signed)
[facility] ? ?Discharge instructions ? ?Lower Extremity Amputation ? ?Please refer to the following instruction for your post-procedure care. Your surgeon or physician assistant will discuss any changes with you. ? ?Activity ? ?You are encouraged to walk as much as you can. You can slowly return to normal activities during the month after your surgery. Avoid strenuous activity and heavy lifting until your doctor tells you it's OK. Avoid activities such as vacuuming or swinging a golf club. Do not drive until your doctor give the OK and you are no longer taking prescription pain medications. It is also normal to have difficulty with sleep habits, eating and bowel movement after surgery. These will go away with time. ? ?Bathing/Showering ? ?Shower daily after you go home. Do not soak in a bathtub, hot tub, or swim until the incision heals completely. ? ?Incision Care ? ?Clean your incision with mild soap and water. Shower every day. Pat the area dry with a clean towel. You do not need a bandage unless otherwise instructed. Do not apply any ointments or creams to your incision. If you have open wounds you will be instructed how to care for them or a visiting nurse may be arranged for you. If you have staples or sutures along your incision they will be removed at your post-op appointment. You may have skin glue on your incision. Do not peel it off. It will come off on its own in about one week. ? ?Diet ? ?Resume your normal diet. There are no special food restrictions following this procedure. A low fat/ low cholesterol diet is recommended for all patients with vascular disease. In order to heal from your surgery, it is CRITICAL to get adequate nutrition. Your body requires vitamins, minerals, and protein. Vegetables are the best source of vitamins and minerals. Vegetables also provide the perfect balance of protein. Processed food has little nutritional value, so try to avoid  this. ? ?Medications ? ?Resume taking all your medications unless your doctor or physician assistant tells you not to. If your incision is causing pain, you may take over-the-counter pain relievers such as acetaminophen (Tylenol). If you were prescribed a stronger pain medication, please aware these medication can cause nausea and constipation. Prevent nausea by taking the medication with a snack or meal. Avoid constipation by drinking plenty of fluids and eating foods with high amount of fiber, such as fruits, vegetables, and grains. Take Colace 100 mg (an over-the-counter stool softener) twice a day as needed for constipation. ? ?Do not take Tylenol if you are taking prescription pain medications. ? ?Follow Up ? ?Our office will schedule a follow up appointment 4 weeks following discharge. ? ?Please call us immediately for any of the following conditions ? ?Increase pain, redness, warmth, or drainage (pus) from your incision site(s) ?Fever of 101 degree or higher ?The swelling in your leg with the amputation suddenly worsens and becomes more painful than when you were in the hospital ? ?Leg swelling is common after amputation surgery. ? ?The swelling should improve over a few months following surgery. To improve the swelling, you may elevate your legs above the level of your heart while you are sitting or resting. Your surgeon or physician assistant may ask you to apply an ACE wrap or wear compression (TED) stockings to help to reduce swelling. ? ?Reduce your risk of vascular disease ? ?Stop smoking. If you would like help call [facility] at 1-800-QUIT-NOW (1-800-784-8669) or [place] at 336-586-4000. ? ?Manage   your cholesterol ?Maintain a desired weight ?Control your diabetes weight ?Control your diabetes ?Keep your blood pressure down ? ?If you have any questions, please call the office at [phone] ?

## 2023-03-12 NOTE — Progress Notes (Signed)
Inpatient Rehab Admissions Coordinator:    I met with pt. To discuss potential CIR admit. He is interested but needs to discuss with sister regarding who will stay with him at d/c. I will send case to insurance and pursue auth.  Clemens Catholic, [place], [place] Admissions Coordinator  [phone] ([place]) [phone] (office)

## 2023-03-12 NOTE — Progress Notes (Signed)
Mobility Specialist - Progress Note   03/12/23 1628  Mobility  Activity Refused mobility    Pt declined, stated he was tired out from washing up with NT right before visit. Will follow up as able.   Tool Specialist Please contact via SecureChat or Rehab office at [phone]

## 2023-03-12 NOTE — Progress Notes (Signed)
PT Cancellation Note  Patient Details Name: [name] MRN: [id number] DOB: 03-12-1951   Cancelled Treatment:    Reason Eval/Treat Not Completed: Fatigue/lethargy limiting ability to participate.  Pt is not up to therapy, has been overwhelmed but also per nsg has just found out his girlfriend has passed since his admission.  Follow up at another time.   Ramond Dial 03/12/2023, 2:21 PM  Mee Hives, PT PhD Acute Rehab Dept. Number: [place] [facility] [place]

## 2023-03-12 NOTE — Progress Notes (Signed)
PROGRESS NOTE  [name] [id number] DOB: July 23, 1951   PCP: Iona Beard, MD  Patient is from: Home  DOA: 03/05/2023 LOS: 7  Chief complaints No chief complaint on file.    Brief Narrative / Interim history: 72 year old M with PMH of PAD/nonhealing RLE wound s/p right CFA and PTA bypass in 11/2022, prostate cancer s/p rad, HTN, HLD, GERD and CKD-3A presenting with RLE pain.  Patient has been followed by vascular surgery and wound clinic for RLE wound and wound VAC.  He was found to have thrombosis of the graft on visit from 2/21 where ABI on the right was noted to be 0.  Evaluated by vascular surgery.  SFA occlusion with no tibial runoff on catheterization on 3/11.  Unfortunately, no revascularization option for his RLE.  Underwent right AKA on 3/15. Therapy recommended CIR. CIR following.   Subjective: Seen and examined earlier this morning.  No major events overnight of this morning.  No complaints other than difficulty resting due to frequent interruption.  Denies pain.  Feels tired.  Transfused 1 unit with appropriate response yesterday.  H&H stable today.  Creatinine continues to improve.  Objective: Vitals:   03/11/23 1336 03/11/23 1359 03/11/23 1943 03/12/23 0425  BP: 129/66 124/67 125/77 115/76  Pulse: 86 89 92 82  Resp: 18 19 20 18   Temp: 98.7 F (37.1 C) 98.6 F (37 C) 98.2 F (36.8 C) 98.6 F (37 C)  TempSrc: Oral Oral Oral Oral  SpO2: 94% 97% 98% 98%  Weight:      Height:        Examination:  GENERAL: No apparent distress.  Nontoxic. HEENT: MMM.  Vision and hearing grossly intact.  NECK: Supple.  No apparent JVD.  RESP:  No IWOB.  Fair aeration bilaterally. CVS:  RRR. Heart sounds normal.  ABD/GI/GU: BS+. Abd soft, NTND.  MSK/EXT:   Left AKA. SKIN: As above NEURO: Awake and alert. Oriented appropriately.  No apparent focal neuro deficit. PSYCH: Calm. Normal affect.   Procedures:  3/15-right AKA by Dr. Donzetta Matters  Microbiology summarized: Blood  cultures NGTD MRSA PCR screen negative.  Assessment and plan: Principal Problem:   Critical limb ischemia of right lower extremity (HCC) Active Problems:   Acute kidney injury superimposed on chronic kidney disease (HCC)   Metabolic acidosis, increased anion gap   Hyperkalemia   Normocytic anemia   Essential hypertension   History of prostate cancer   Cellulitis of right lower extremity   Fever   Non-traumatic rhabdomyolysis   Bandemia  Critical ischemia of the right leg: s/p arteriogram that showed SFA occlusion with no tibial runoff. -S/p right AKA by Dr. Donzetta Matters on 3/15 -Pain control per vascular surgery -PT/OT-recommended CIR  AKI on CKD-3A: Renal US with moderate right and mild left hydronephrosis.  UA negative.  CK elevated but improving.  AKI improving.  Passed voiding trial on 3/16. Recent Labs    03/05/23 1031 03/05/23 1038 03/05/23 1846 03/06/23 0139 03/07/23 0142 03/08/23 0137 03/09/23 0224 03/10/23 0045 03/11/23 0208 03/12/23 0225  BUN 127* 136* 122* 113* 77* 50* 38* 37* 40* 35*  CREATININE 5.10* 4.42* 4.00* 3.56* 3.07* 2.90* 2.57* 2.37* 2.23* 1.80*  -Continue monitoring -Avoid nephrotoxic meds -Continue trending CK  Fever/leukocytosis/bandemia: Possible RLE cellulitis.  Now s/p right AKA. Blood cultures NGTD.  Last fever on 3/15.  Leukocytosis resolving.  -Continue IV ceftriaxone 3/15>> for a total of 5 days   Normocytic anemia: Hgb reached nadir at 5.9 on 3/12.  Unclear cause of this.  No overt bleeding.  Transfused a total of 3 units so far.  Anemia panel suggests anemia of chronic disease Recent Labs    03/05/23 1846 03/06/23 0139 03/06/23 2335 03/07/23 0142 03/08/23 0137 03/09/23 0224 03/10/23 0045 03/11/23 0208 03/11/23 1535 03/12/23 0225  HGB 7.0* 5.9* 8.4* 8.2* 8.5* 8.0* 7.7* 7.4* 9.2* 9.0*  -Monitor H&H  Hyperkalemia/metabolic acidosis: Resolved  Essential hypertension: Normotensive -Continue metoprolol   Thrombocytosis: Likely  reactive.  Stable. Continue to monitor.    Mildly elevated liver enzymes: Likely due to rhabdo.  Improving. -Monitor  Nontraumatic rhabdomyolysis: Likely due to RLE ischemia/gangrene.  Improved.  Metabolic acidosis with normal anion gap: Improved. -Continue p.o. sodium bicarbonate   Hyperlipidemia:  -Started   H/o Prostate ca: S/p radiation therapy.    Anxiety:  -On ativan prn.    Mild hyponatremia: Continue monitoring    Body mass index is 29.9 kg/m.          DVT prophylaxis:  heparin injection 5,000 Units Start: 03/05/23 2200 SCDs Start: 03/05/23 1136  Code Status: Full code Family Communication: None at bedside Level of care: Telemetry Medical Status is: Inpatient Remains inpatient appropriate because: Placement/CIR.   Final disposition: CIR Consultants:  Vascular surgery  35 minutes with more than 50% spent in reviewing records, counseling patient/family and coordinating care.   Sch Meds:  Scheduled Meds:  aspirin EC  81 mg Oral Daily   heparin  5,000 Units Subcutaneous Q8H   metoprolol tartrate  12.5 mg Oral BID   pantoprazole  40 mg Oral Daily   sodium bicarbonate  650 mg Oral TID   sodium chloride flush  3 mL Intravenous Q12H   sodium chloride flush  3 mL Intravenous Q12H   Continuous Infusions:  sodium chloride Stopped (03/06/23 1725)   cefTRIAXone (ROCEPHIN)  IV Stopped (03/12/23 0910)   PRN Meds:.sodium chloride, acetaminophen **OR** acetaminophen, albuterol, bisacodyl, docusate sodium, HYDROmorphone (DILAUDID) injection, labetalol, LORazepam, ondansetron (ZOFRAN) IV, oxyCODONE, sodium chloride flush, sodium phosphate  Antimicrobials: Anti-infectives (From admission, onward)    Start     Dose/Rate Route Frequency Ordered Stop   03/09/23 0800  cefTRIAXone (ROCEPHIN) 2 g in sodium chloride 0.9 % 100 mL IVPB        2 g 200 mL/hr over 30 Minutes Intravenous Every 24 hours 03/09/23 0746 03/16/23 0759        I have personally reviewed the  following labs and images: CBC: Recent Labs  Lab 03/07/23 0142 03/08/23 0137 03/09/23 0224 03/10/23 0045 03/11/23 0208 03/11/23 1535 03/12/23 0225  WBC 16.0* 17.7* 18.4* 17.4* 16.8*  --  10.9*  NEUTROABS 12.8*  --   --  16.4* 13.8*  --   --   HGB 8.2* 8.5* 8.0* 7.7* 7.4* 9.2* 9.0*  HCT 23.9* 25.0* 23.7* 23.4* 23.0* 27.4* 27.8*  MCV 84.2 85.9 86.8 88.0 89.8  --  89.1  PLT 448* 459* 438* 437* 451*  --  442*   BMP &GFR Recent Labs  Lab 03/08/23 0137 03/09/23 0224 03/10/23 0045 03/11/23 0208 03/12/23 0225  NA 135 132* 134* 136 136  K 4.5 4.7 5.1 4.6 4.6  CL 104 106 105 105 107  CO2 22 18* 17* 20* 20*  GLUCOSE 114* 141* 185* 135* 114*  BUN 50* 38* 37* 40* 35*  CREATININE 2.90* 2.57* 2.37* 2.23* 1.80*  CALCIUM 8.8* 8.6* 8.9 9.0 9.0  MG 2.0 1.7 1.8 1.7 1.9  PHOS 3.5 2.7 4.3 2.8 3.3   Estimated Creatinine Clearance: 44.8 mL/min (A) (by C-G formula  based on SCr of 1.8 mg/dL (H)). Liver & Pancreas: Recent Labs  Lab 03/05/23 1846 03/08/23 0137 03/09/23 0224 03/10/23 0045 03/11/23 0208 03/12/23 0225  AST 64*  --  74* 52* 57*  --   ALT 93*  --  69* 62* 71*  --   ALKPHOS 104  --  102 105 108  --   BILITOT 0.5  --  0.5 0.5 0.2*  --   PROT 7.0  --  6.3* 6.1* 6.2*  --   ALBUMIN 2.4* 2.0* 1.9* 1.7* 1.8* 1.9*   No results for input(s): "LIPASE", "AMYLASE" in the last 168 hours. No results for input(s): "AMMONIA" in the last 168 hours. Diabetic: No results for input(s): "HGBA1C" in the last 72 hours. No results for input(s): "GLUCAP" in the last 168 hours. Cardiac Enzymes: Recent Labs  Lab 03/09/23 0224 03/10/23 0045 03/11/23 0208  CKTOTAL 1,792* 910* 516*   No results for input(s): "PROBNP" in the last 8760 hours. Coagulation Profile: No results for input(s): "INR", "PROTIME" in the last 168 hours. Thyroid Function Tests: No results for input(s): "TSH", "T4TOTAL", "FREET4", "T3FREE", "THYROIDAB" in the last 72 hours. Lipid Profile: No results for input(s):  "CHOL", "HDL", "LDLCALC", "TRIG", "CHOLHDL", "LDLDIRECT" in the last 72 hours. Anemia Panel: No results for input(s): "VITAMINB12", "FOLATE", "FERRITIN", "TIBC", "IRON", "RETICCTPCT" in the last 72 hours.  Urine analysis:    Component Value Date/Time   COLORURINE YELLOW 03/06/2023 1420   APPEARANCEUR CLEAR 03/06/2023 1420   LABSPEC 1.010 03/06/2023 1420   PHURINE 8.0 03/06/2023 1420   GLUCOSEU NEGATIVE 03/06/2023 1420   HGBUR SMALL (A) 03/06/2023 1420   BILIRUBINUR NEGATIVE 03/06/2023 1420   KETONESUR NEGATIVE 03/06/2023 1420   PROTEINUR NEGATIVE 03/06/2023 1420   NITRITE NEGATIVE 03/06/2023 1420   LEUKOCYTESUR NEGATIVE 03/06/2023 1420   Sepsis Labs: Invalid input(s): "PROCALCITONIN", "LACTICIDVEN"  Microbiology: Recent Results (from the past 240 hour(s))  Surgical pcr screen     Status: None   Collection Time: 03/09/23  5:40 AM   Specimen: Nasal Mucosa; Nasal Swab  Result Value Ref Range Status   MRSA, PCR NEGATIVE NEGATIVE Final   Staphylococcus aureus NEGATIVE NEGATIVE Final    Comment: (NOTE) The Xpert SA Assay (FDA approved for NASAL specimens in patients 81 years of age and older), is one component of a comprehensive surveillance program. It is not intended to diagnose infection nor to guide or monitor treatment. Performed at [facility], [place] [address], [place], [place] [address]   Culture, blood (Routine X 2) w Reflex to ID Panel     Status: None (Preliminary result)   Collection Time: 03/09/23  8:08 AM   Specimen: BLOOD  Result Value Ref Range Status   Specimen Description BLOOD SITE NOT SPECIFIED  Final   Special Requests   Final    BOTTLES DRAWN AEROBIC AND ANAEROBIC Blood Culture adequate volume   Culture   Final    NO GROWTH 3 DAYS Performed at [facility], [address], [place], [place] [address]    Report Status PENDING  Incomplete  Culture, blood (Routine X 2) w Reflex to ID Panel     Status: None (Preliminary result)   Collection  Time: 03/09/23  8:15 AM   Specimen: BLOOD  Result Value Ref Range Status   Specimen Description BLOOD SITE NOT SPECIFIED  Final   Special Requests   Final    BOTTLES DRAWN AEROBIC AND ANAEROBIC Blood Culture adequate volume   Culture   Final  NO GROWTH 3 DAYS Performed at [facility], [place] [address] [place],  [address]    Report Status PENDING  Incomplete    Radiology Studies: No results found.    Davonda Ausley T. Ar[place]  If 7PM-7AM, please contact night-coverage [email or web address] 03/12/2023, 3:53 PM

## 2023-03-12 NOTE — Consult Note (Addendum)
Physical Medicine and Rehabilitation Consult Reason for Consult:Impaired functional mobility d/t PAD/right AKA Referring Physician: Cyndia Skeeters   HPI: [name] is a 72 y.o. male with a history of PAD and a chronic lower extremity wound status post multiple revascularization procedures, prostate cancer, chronic kidney disease who had further progression of his vascular disease which ultimately required a right above-knee amputation on March 09, 2023 by Dr. Donzetta Matters. Hospital course complicated by AKI on CKD d/t hydronephrosis. Pt is voiding. IV ceftriaxone completed for RLE cellulitis. Pt has had persistent anemia which is felt to be acute on chronic. LFT's still sl elevated, ?d/t rhabdomyolysis. Pt has been working with therapy and as of 3/16 was mod assist for transfers with RW and min to mod assist with BADL's.    Review of Systems  Constitutional:  Negative for fever.  HENT:  Negative for hearing loss.   Eyes:  Negative for blurred vision.  Respiratory:  Negative for cough.   Cardiovascular: Negative.   Gastrointestinal:  Negative for nausea and vomiting.  Genitourinary:  Negative for dysuria.  Musculoskeletal:  Positive for myalgias.  Skin:  Negative for rash.  Neurological:  Positive for weakness.  Psychiatric/Behavioral:  Positive for depression.        Denies depression but struggling somewhat with loss of his leg   Past Medical History:  Diagnosis Date   Family history of metabolic acidosis with increased anion gap 03/05/2023   GERD (gastroesophageal reflux disease)    History of kidney stones    Hyperlipidemia    Hypertension    Prostate cancer [facility]) 2011   radiation june 2020   Umbilical hernia    Wears dentures    full   Wears glasses    Wears partial dentures    lower   Past Surgical History:  Procedure Laterality Date   ABDOMINAL AORTOGRAM W/LOWER EXTREMITY N/A 11/20/2022   Procedure: ABDOMINAL AORTOGRAM W/LOWER EXTREMITY;  Surgeon: Waynetta Sandy, MD;  Location: [facility];  Service: Cardiovascular;  Laterality: N/A;   ABDOMINAL AORTOGRAM W/LOWER EXTREMITY N/A 03/05/2023   Procedure: ABDOMINAL AORTOGRAM W/LOWER EXTREMITY;  Surgeon: Waynetta Sandy, MD;  Location: [facility];  Service: Cardiovascular;  Laterality: N/A;   AMPUTATION Right 03/09/2023   Procedure: AMPUTATION ABOVE KNEE;  Surgeon: Waynetta Sandy, MD;  Location: [place];  Service: Vascular;  Laterality: Right;   COLONOSCOPY N/A 08/25/2020   Procedure: COLONOSCOPY;  Surgeon: Daneil Dolin, MD;  Location: [facility];  Service: Endoscopy;  Laterality: N/A;  9:30   COLONOSCOPY     CYSTOSCOPY WITH LITHOLAPAXY N/A 05/18/2021   Procedure: CYSTOSCOPY WITH LITHOLAPAXY;  Surgeon: Ceasar Mons, MD;  Location: [facility];  Service: Urology;  Laterality: N/A;  ONLY NEEDS  30 MIN   FEMORAL-TIBIAL BYPASS GRAFT Right 12/12/2022   Procedure: RIGHT COMMON FEMORAL-POSTERIOR TIBIAL ARTERY BYPASS WITH VEIN HARVESTING OF THE GREATER SAPHENOUS VEIN AND FEMORAL ENDARTERECTOMY WITH COMPOSITE GRAFT;  Surgeon: Waynetta Sandy, MD;  Location: [place];  Service: Vascular;  Laterality: Right;   POLYPECTOMY  08/25/2020   Procedure: POLYPECTOMY;  Surgeon: Daneil Dolin, MD;  Location: [facility];  Service: Endoscopy;;   ROBOT ASSISTED LAPAROSCOPIC RADICAL PROSTATECTOMY  2011   TONSILLECTOMY  age 69   and adenoids removed   Family History  Problem Relation Age of Onset   Gastric cancer Mother    Prostate cancer Father    Breast cancer Neg Hx  Pancreatic cancer Neg Hx    Social History:  reports that he quit smoking about 3 months ago. His smoking use included cigarettes. He has a 5.25 pack-year smoking history. He has never used smokeless tobacco. He reports that he does not drink alcohol and does not use drugs. Allergies:  Allergies  Allergen Reactions   Shellfish Allergy Swelling   Facility-Administered  Medications Prior to Admission  Medication Dose Route Frequency Provider Last Rate Last Admin   betamethasone acetate-betamethasone sodium phosphate (CELESTONE) injection 3 mg  3 mg Intra-articular Once Daylene Katayama M, DPM       betamethasone acetate-betamethasone sodium phosphate (CELESTONE) injection 3 mg  3 mg Intra-articular Once Edrick Kins, DPM       Medications Prior to Admission  Medication Sig Dispense Refill   acetaminophen-codeine (TYLENOL #3) 300-30 MG tablet Take 1 tablet by mouth 2 (two) times daily.     aspirin EC 81 MG tablet Take 81 mg by mouth daily.     atorvastatin (LIPITOR) 40 MG tablet Take 1 tablet (40 mg total) by mouth every morning. 30 tablet 3   esomeprazole (NEXIUM) 20 MG capsule Take 20 mg by mouth daily.     furosemide (LASIX) 20 MG tablet Take 20 mg by mouth daily.     lisinopril-hydrochlorothiazide (PRINZIDE,ZESTORETIC) 10-12.5 MG tablet Take 1 tablet by mouth daily.     meloxicam (MOBIC) 15 MG tablet Take 1 tablet by mouth once daily 30 tablet 0   metoprolol tartrate (LOPRESSOR) 25 MG tablet Take 1/2 (one-half) tablet by mouth twice daily 30 tablet 0   Multiple Vitamins-Minerals (CENTRUM SILVER PO) Take 1 tablet by mouth daily.      oxybutynin (DITROPAN-XL) 10 MG 24 hr tablet Take 10 mg by mouth daily.     potassium chloride SA (KLOR-CON M) 20 MEQ tablet Take 10 mEq by mouth daily.     gabapentin (NEURONTIN) 100 MG capsule Take 1 capsule (100 mg total) by mouth 3 (three) times daily. (Patient not taking: Reported on 02/28/2023) 90 capsule 3   SPIKEVAX syringe       Home: Home Living Family/patient expects to be discharged to:: Private residence Living Arrangements: Alone Available Help at Discharge: Family, Friend(s), Available PRN/intermittently (church family) Type of Home: House Home Access: Level entry, Stairs to enter [employer] of Steps: 2 Entrance Stairs-Rails: Right, Left Home Layout: One level Bathroom Shower/Tub: Walk-in shower,  Tub/shower unit (walk in shower is in bedroom, tub shower in hall bath) Bathroom Toilet: Standard (has high commode in hall bathroom) Home Equipment: BSC/3in1, Conservation officer, nature (2 wheels), [employer] - single point (family is working on getting pt a hospital bed) Additional Comments: sister is Therapist, sports, is unsure how much help he will have initially at d/c.  Functional History: Prior Function Prior Level of Function : Independent/Modified Independent, Driving Mobility Comments: reports intermittent use of cane ADLs Comments: ind Functional Status:  Mobility: Bed Mobility Overal bed mobility: Needs Assistance Bed Mobility: Supine to Sit Supine to sit: Min assist Transfers Overall transfer level: Needs assistance Equipment used: Rolling walker (2 wheels) Transfers: Sit to/from Stand Sit to Stand: Mod assist General transfer comment: mod A from low BSC surface, cues for hand placement Ambulation/Gait Ambulation/Gait assistance: Min assist Gait Distance (Feet): 3 Feet Assistive device: Rolling walker (2 wheels) Gait Pattern/deviations:  (hop-to gait) General Gait Details: hop-to gait with reduced L foot clearance Gait velocity: reuced Gait velocity interpretation: <1.31 ft/sec, indicative of household ambulator    ADL: ADL Overall ADL's :  Needs assistance/impaired Eating/Feeding: Set up Grooming: Set up, Sitting Upper Body Bathing: Min guard, Sitting Lower Body Bathing: Moderate assistance, Sitting/lateral leans Upper Body Dressing : Minimal assistance, Sitting Lower Body Dressing: Moderate assistance, Sitting/lateral leans, Bed level Toilet Transfer: Moderate assistance, Stand-pivot, BSC/3in1, Rolling walker (2 wheels) Toileting- Clothing Manipulation and Hygiene: Maximal assistance Functional mobility during ADLs: Moderate assistance, Rolling walker (2 wheels)  Cognition: Cognition Overall Cognitive Status: Within Functional Limits for tasks assessed Orientation Level: Oriented  X4 Cognition Arousal/Alertness: Awake/alert Behavior During Therapy: WFL for tasks assessed/performed Overall Cognitive Status: Within Functional Limits for tasks assessed General Comments: at times tearful/emotional in regards to recent loss of limb  Blood pressure 115/76, pulse 82, temperature 98.6 F (37 C), temperature source Oral, resp. rate 18, height 6' (1.829 m), weight 100 kg, SpO2 98 %. Physical Exam Constitutional:      General: He is not in acute distress.    Appearance: He is normal weight.  HENT:     Head: Normocephalic and atraumatic.     Nose: Nose normal.     Mouth/Throat:     Comments: Full dentures Eyes:     Pupils: Pupils are equal, round, and reactive to light.  Cardiovascular:     Rate and Rhythm: Normal rate.  Pulmonary:     Effort: Pulmonary effort is normal.  Abdominal:     Palpations: Abdomen is soft.  Musculoskeletal:     Cervical back: Normal range of motion and neck supple.     Comments: Right AK stump swollen and tender  Skin:    Comments: Incision Clean, sl serosang drainage from medial and lateral edges. No dressing  Neurological:     Comments: Alert and oriented x 3. Normal insight and awareness. Intact Memory. Normal language and speech. Cranial nerve exam unremarkable. A bit tangential. UE motor 5/5. LLE 4- prox to 4/5 distally. Struggles lifting right residual limb currently  Psychiatric:        Mood and Affect: Mood normal.        Behavior: Behavior normal.     Results for orders placed or performed during the hospital encounter of 03/05/23 (from the past 24 hour(s))  Hemoglobin and hematocrit, blood     Status: Abnormal   Collection Time: 03/11/23  3:35 PM  Result Value Ref Range   Hemoglobin 9.2 (L) 13.0 - 17.0 g/dL   HCT 27.4 (L) 39.0 - 52.0 %  Renal function panel     Status: Abnormal   Collection Time: 03/12/23  2:25 AM  Result Value Ref Range   Sodium 136 135 - 145 mmol/L   Potassium 4.6 3.5 - 5.1 mmol/L   Chloride 107 98 -  111 mmol/L   CO2 20 (L) 22 - 32 mmol/L   Glucose, Bld 114 (H) 70 - 99 mg/dL   BUN 35 (H) 8 - 23 mg/dL   Creatinine, Ser 1.80 (H) 0.61 - 1.24 mg/dL   Calcium 9.0 8.9 - 10.3 mg/dL   Phosphorus 3.3 2.5 - 4.6 mg/dL   Albumin 1.9 (L) 3.5 - 5.0 g/dL   GFR, Estimated 39 (L) >60 mL/min   Anion gap 9 5 - 15  Magnesium     Status: None   Collection Time: 03/12/23  2:25 AM  Result Value Ref Range   Magnesium 1.9 1.7 - 2.4 mg/dL  CBC     Status: Abnormal   Collection Time: 03/12/23  2:25 AM  Result Value Ref Range   WBC 10.9 (H) 4.0 - 10.5 K/uL  RBC 3.12 (L) 4.22 - 5.81 MIL/uL   Hemoglobin 9.0 (L) 13.0 - 17.0 g/dL   HCT 27.8 (L) 39.0 - 52.0 %   MCV 89.1 80.0 - 100.0 fL   MCH 28.8 26.0 - 34.0 pg   MCHC 32.4 30.0 - 36.0 g/dL   RDW 16.0 (H) 11.5 - 15.5 %   Platelets 442 (H) 150 - 400 K/uL   nRBC 0.0 0.0 - 0.2 %   No results found.  Assessment/Plan: Diagnosis: 72 yo with PAD s/p right AKA. Family built ramp to enter home.  Does the need for close, 24 hr/day medical supervision in concert with the patient's rehab needs make it unreasonable for this patient to be served in a less intensive setting? Yes Co-Morbidities requiring supervision/potential complications:  -Acute on chronic renal failure, hydronephrosis, hx of prostate cancer -Acute on chronic anemia -pain mgt -wound care  Due to bladder management, bowel management, safety, skin/wound care, disease management, medication administration, pain management, and patient education, does the patient require 24 hr/day rehab nursing? Yes Does the patient require coordinated care of a physician, rehab nurse, therapy disciplines of PT, OT, neuropsych to address physical and functional deficits in the context of the above medical diagnosis(es)? Yes Addressing deficits in the following areas: balance, endurance, locomotion, strength, transferring, bowel/bladder control, bathing, dressing, feeding, grooming, toileting, and psychosocial  support Can the patient actively participate in an intensive therapy program of at least 3 hrs of therapy per day at least 5 days per week? Yes The potential for patient to make measurable gains while on inpatient rehab is excellent Anticipated functional outcomes upon discharge from inpatient rehab are modified independent and supervision  with PT, modified independent and supervision with OT, n/a with SLP.  W/c goals most likely. Estimated rehab length of stay to reach the above functional goals is: 8-12 days Anticipated discharge destination: Home Overall Rehab/Functional Prognosis: excellent  POST ACUTE RECOMMENDATIONS: This patient's condition is appropriate for continued rehabilitative care in the following setting: CIR Patient has agreed to participate in recommended program.  Yes, but he also wants sister to be part of decision Note that insurance prior authorization may be required for reimbursement for recommended care.  Comment: Rehab Admissions Coordinator to follow up. As above pt wants sister to be part of decision to come   MEDICAL RECOMMENDATIONS: Would recommend at least dry dressing + ACE to keep incision clean and protected Pre-treat with oxycodone prior to activity. Discussed massage and manual feedback to help with stump pain. Pt says he feels a little depressed but doesn't want to talk with anyone about it. He would ultimately benefit from psychological counseling at some point to help with coping.     I have personally performed a face to face diagnostic evaluation of this patient. Additionally, I have examined the patient's medical record including any pertinent labs and radiographic images. If the physician assistant has documented in this note, I have reviewed and edited or otherwise concur with the physician assistant's documentation.  Thanks,  Meredith Staggers, MD 03/12/2023

## 2023-03-12 NOTE — Plan of Care (Signed)

## 2023-03-13 ENCOUNTER — Inpatient Hospital Stay (HOSPITAL_COMMUNITY)
Admission: RE | Admit: 2023-03-13 | Discharge: 2023-03-26 | DRG: 560 | Disposition: A | Discharge status: Home Health | Payer: Medicare Other | Source: Intra-hospital | Attending: Physical Medicine and Rehabilitation | Admitting: Physical Medicine and Rehabilitation

## 2023-03-13 ENCOUNTER — Encounter (HOSPITAL_COMMUNITY): Payer: Self-pay | Admitting: Physical Medicine and Rehabilitation

## 2023-03-13 DIAGNOSIS — Z7982 Long term (current) use of aspirin: Secondary | ICD-10-CM | POA: Diagnosis not present

## 2023-03-13 DIAGNOSIS — Z8042 Family history of malignant neoplasm of prostate: Secondary | ICD-10-CM

## 2023-03-13 DIAGNOSIS — E860 Dehydration: Secondary | ICD-10-CM | POA: Diagnosis present

## 2023-03-13 DIAGNOSIS — R7989 Other specified abnormal findings of blood chemistry: Secondary | ICD-10-CM | POA: Diagnosis present

## 2023-03-13 DIAGNOSIS — D649 Anemia, unspecified: Secondary | ICD-10-CM | POA: Diagnosis present

## 2023-03-13 DIAGNOSIS — Z923 Personal history of irradiation: Secondary | ICD-10-CM | POA: Diagnosis not present

## 2023-03-13 DIAGNOSIS — G546 Phantom limb syndrome with pain: Secondary | ICD-10-CM | POA: Insufficient documentation

## 2023-03-13 DIAGNOSIS — I129 Hypertensive chronic kidney disease with stage 1 through stage 4 chronic kidney disease, or unspecified chronic kidney disease: Secondary | ICD-10-CM | POA: Diagnosis present

## 2023-03-13 DIAGNOSIS — N183 Chronic kidney disease, stage 3 unspecified: Secondary | ICD-10-CM | POA: Diagnosis present

## 2023-03-13 DIAGNOSIS — Z800 Family history of malignant neoplasm of digestive organs: Secondary | ICD-10-CM | POA: Diagnosis not present

## 2023-03-13 DIAGNOSIS — E8729 Other acidosis: Secondary | ICD-10-CM | POA: Diagnosis not present

## 2023-03-13 DIAGNOSIS — E875 Hyperkalemia: Secondary | ICD-10-CM | POA: Diagnosis not present

## 2023-03-13 DIAGNOSIS — Z87891 Personal history of nicotine dependence: Secondary | ICD-10-CM | POA: Diagnosis not present

## 2023-03-13 DIAGNOSIS — F4323 Adjustment disorder with mixed anxiety and depressed mood: Secondary | ICD-10-CM | POA: Diagnosis not present

## 2023-03-13 DIAGNOSIS — R7303 Prediabetes: Secondary | ICD-10-CM | POA: Diagnosis present

## 2023-03-13 DIAGNOSIS — R748 Abnormal levels of other serum enzymes: Secondary | ICD-10-CM | POA: Diagnosis not present

## 2023-03-13 DIAGNOSIS — Z89611 Acquired absence of right leg above knee: Secondary | ICD-10-CM | POA: Diagnosis not present

## 2023-03-13 DIAGNOSIS — Z91013 Allergy to seafood: Secondary | ICD-10-CM

## 2023-03-13 DIAGNOSIS — F419 Anxiety disorder, unspecified: Secondary | ICD-10-CM | POA: Diagnosis present

## 2023-03-13 DIAGNOSIS — D62 Acute posthemorrhagic anemia: Secondary | ICD-10-CM | POA: Diagnosis not present

## 2023-03-13 DIAGNOSIS — I70221 Atherosclerosis of native arteries of extremities with rest pain, right leg: Secondary | ICD-10-CM | POA: Diagnosis not present

## 2023-03-13 DIAGNOSIS — N1330 Unspecified hydronephrosis: Secondary | ICD-10-CM | POA: Diagnosis not present

## 2023-03-13 DIAGNOSIS — M79604 Pain in right leg: Secondary | ICD-10-CM | POA: Diagnosis not present

## 2023-03-13 DIAGNOSIS — E871 Hypo-osmolality and hyponatremia: Secondary | ICD-10-CM | POA: Diagnosis not present

## 2023-03-13 DIAGNOSIS — Z79899 Other long term (current) drug therapy: Secondary | ICD-10-CM

## 2023-03-13 DIAGNOSIS — Z4781 Encounter for orthopedic aftercare following surgical amputation: Secondary | ICD-10-CM | POA: Diagnosis not present

## 2023-03-13 DIAGNOSIS — S78111A Complete traumatic amputation at level between right hip and knee, initial encounter: Secondary | ICD-10-CM

## 2023-03-13 DIAGNOSIS — Z8546 Personal history of malignant neoplasm of prostate: Secondary | ICD-10-CM

## 2023-03-13 DIAGNOSIS — L03115 Cellulitis of right lower limb: Secondary | ICD-10-CM | POA: Diagnosis not present

## 2023-03-13 DIAGNOSIS — K219 Gastro-esophageal reflux disease without esophagitis: Secondary | ICD-10-CM | POA: Diagnosis not present

## 2023-03-13 DIAGNOSIS — K5900 Constipation, unspecified: Secondary | ICD-10-CM | POA: Diagnosis present

## 2023-03-13 DIAGNOSIS — R509 Fever, unspecified: Secondary | ICD-10-CM | POA: Diagnosis not present

## 2023-03-13 DIAGNOSIS — D72825 Bandemia: Secondary | ICD-10-CM | POA: Diagnosis not present

## 2023-03-13 DIAGNOSIS — I10 Essential (primary) hypertension: Secondary | ICD-10-CM | POA: Diagnosis not present

## 2023-03-13 DIAGNOSIS — E785 Hyperlipidemia, unspecified: Secondary | ICD-10-CM | POA: Diagnosis present

## 2023-03-13 DIAGNOSIS — N179 Acute kidney failure, unspecified: Secondary | ICD-10-CM | POA: Diagnosis present

## 2023-03-13 DIAGNOSIS — M6282 Rhabdomyolysis: Secondary | ICD-10-CM | POA: Diagnosis not present

## 2023-03-13 DIAGNOSIS — N189 Chronic kidney disease, unspecified: Secondary | ICD-10-CM | POA: Diagnosis not present

## 2023-03-13 LAB — CBC
HCT: 27.1 % — ABNORMAL LOW (ref 39.0–52.0)
Hemoglobin: 8.7 g/dL — ABNORMAL LOW (ref 13.0–17.0)
MCH: 28.7 pg (ref 26.0–34.0)
MCHC: 32.1 g/dL (ref 30.0–36.0)
MCV: 89.4 fL (ref 80.0–100.0)
Platelets: 385 10*3/uL (ref 150–400)
RBC: 3.03 MIL/uL — ABNORMAL LOW (ref 4.22–5.81)
RDW: 15.8 % — ABNORMAL HIGH (ref 11.5–15.5)
WBC: 9.4 10*3/uL (ref 4.0–10.5)
nRBC: 0 % (ref 0.0–0.2)

## 2023-03-13 LAB — RENAL FUNCTION PANEL
Albumin: 1.9 g/dL — ABNORMAL LOW (ref 3.5–5.0)
Anion gap: 12 (ref 5–15)
BUN: 36 mg/dL — ABNORMAL HIGH (ref 8–23)
CO2: 21 mmol/L — ABNORMAL LOW (ref 22–32)
Calcium: 8.9 mg/dL (ref 8.9–10.3)
Chloride: 102 mmol/L (ref 98–111)
Creatinine, Ser: 1.87 mg/dL — ABNORMAL HIGH (ref 0.61–1.24)
GFR, Estimated: 37 mL/min — ABNORMAL LOW (ref >60)
Glucose, Bld: 115 mg/dL — ABNORMAL HIGH (ref 70–99)
Phosphorus: 3.2 mg/dL (ref 2.5–4.6)
Potassium: 4.5 mmol/L (ref 3.5–5.1)
Sodium: 135 mmol/L (ref 135–145)

## 2023-03-13 LAB — CK: Total CK: 369 U/L (ref 49–397)

## 2023-03-13 LAB — MAGNESIUM: Magnesium: 1.8 mg/dL (ref 1.7–2.4)

## 2023-03-13 MED ORDER — DOCUSATE SODIUM 100 MG PO CAPS: 100.0000 mg | ORAL_CAPSULE | Freq: Two times a day (BID) | ORAL | Status: DC | PRN

## 2023-03-13 MED ORDER — ALUM & MAG HYDROXIDE-SIMETH 200-200-20 MG/5ML PO SUSP: 30.0000 mL | ORAL | Status: DC | PRN

## 2023-03-13 MED ORDER — OXYCODONE HCL 5 MG PO TABS: 5.0000 mg | ORAL_TABLET | Freq: Four times a day (QID) | ORAL | 0 refills | Status: DC | PRN

## 2023-03-13 MED ORDER — DIPHENHYDRAMINE HCL 12.5 MG/5ML PO ELIX: 12.5000 mg | ORAL_SOLUTION | Freq: Four times a day (QID) | ORAL | Status: DC | PRN

## 2023-03-13 MED ORDER — PROSOURCE PLUS PO LIQD
30.0000 mL | Freq: Two times a day (BID) | ORAL | Status: DC
  Administered 2023-03-14 – 2023-03-25 (×20): 30 mL via ORAL
  Filled 2023-03-13 (×14): qty 30

## 2023-03-13 MED ORDER — GUAIFENESIN-DM 100-10 MG/5ML PO SYRP
5.0000 mL | ORAL_SOLUTION | Freq: Four times a day (QID) | ORAL | Status: DC | PRN
  Administered 2023-03-20: 10 mL via ORAL
  Filled 2023-03-13: qty 10

## 2023-03-13 MED ORDER — ACETAMINOPHEN 325 MG PO TABS: 650.0000 mg | ORAL_TABLET | Freq: Four times a day (QID) | ORAL | Status: DC | PRN

## 2023-03-13 MED ORDER — SENNOSIDES-DOCUSATE SODIUM 8.6-50 MG PO TABS: 2.0000 | ORAL_TABLET | Freq: Two times a day (BID) | ORAL | 0 refills | Status: DC | PRN

## 2023-03-13 MED ORDER — JUVEN PO PACK
1.0000 | PACK | Freq: Two times a day (BID) | ORAL | Status: DC
  Administered 2023-03-14 – 2023-03-25 (×19): 1 via ORAL
  Filled 2023-03-13 (×13): qty 1

## 2023-03-13 MED ORDER — ALBUTEROL SULFATE (2.5 MG/3ML) 0.083% IN NEBU: 2.5000 mg | INHALATION_SOLUTION | Freq: Four times a day (QID) | RESPIRATORY_TRACT | Status: DC | PRN

## 2023-03-13 MED ORDER — PANTOPRAZOLE SODIUM 40 MG PO TBEC: 40.0000 mg | DELAYED_RELEASE_TABLET | Freq: Every day | ORAL | Status: DC

## 2023-03-13 MED ORDER — PROCHLORPERAZINE 25 MG RE SUPP: 12.5000 mg | Freq: Four times a day (QID) | RECTAL | Status: DC | PRN

## 2023-03-13 MED ORDER — OXYCODONE HCL 5 MG PO TABS
5.0000 mg | ORAL_TABLET | ORAL | Status: DC | PRN
  Administered 2023-03-14 – 2023-03-22 (×4): 10 mg via ORAL
  Filled 2023-03-13 (×4): qty 2

## 2023-03-13 MED ORDER — LORAZEPAM 0.5 MG PO TABS: 0.2500 mg | ORAL_TABLET | Freq: Four times a day (QID) | ORAL | Status: DC | PRN

## 2023-03-13 MED ORDER — SODIUM BICARBONATE 650 MG PO TABS
650.0000 mg | ORAL_TABLET | Freq: Three times a day (TID) | ORAL | Status: DC
  Administered 2023-03-13 – 2023-03-26 (×38): 650 mg via ORAL
  Filled 2023-03-13 (×38): qty 1

## 2023-03-13 MED ORDER — METOPROLOL TARTRATE 25 MG PO TABS: 12.5000 mg | ORAL_TABLET | Freq: Two times a day (BID) | ORAL | Status: DC

## 2023-03-13 MED ORDER — POLYSACCHARIDE IRON COMPLEX 150 MG PO CAPS
150.0000 mg | ORAL_CAPSULE | Freq: Every day | ORAL | Status: DC
  Administered 2023-03-13 – 2023-03-25 (×13): 150 mg via ORAL
  Filled 2023-03-13 (×13): qty 1

## 2023-03-13 MED ORDER — POLYETHYLENE GLYCOL 3350 17 GM/SCOOP PO POWD: 17.0000 g | Freq: Two times a day (BID) | ORAL | 0 refills | Status: DC | PRN

## 2023-03-13 MED ORDER — ENOXAPARIN SODIUM 40 MG/0.4ML IJ SOSY
40.0000 mg | PREFILLED_SYRINGE | INTRAMUSCULAR | Status: DC
  Administered 2023-03-13 – 2023-03-25 (×13): 40 mg via SUBCUTANEOUS
  Filled 2023-03-13 (×13): qty 0.4

## 2023-03-13 MED ORDER — BISACODYL 10 MG RE SUPP: 10.0000 mg | Freq: Every day | RECTAL | Status: DC | PRN

## 2023-03-13 MED ORDER — POLYETHYLENE GLYCOL 3350 17 G PO PACK
17.0000 g | PACK | Freq: Every day | ORAL | Status: DC | PRN
  Administered 2023-03-20: 17 g via ORAL
  Filled 2023-03-13: qty 1

## 2023-03-13 MED ORDER — FLEET ENEMA 7-19 GM/118ML RE ENEM: 1.0000 | ENEMA | Freq: Once | RECTAL | Status: DC | PRN

## 2023-03-13 MED ORDER — CYCLOBENZAPRINE HCL 5 MG PO TABS: 5.0000 mg | ORAL_TABLET | Freq: Three times a day (TID) | ORAL | Status: DC | PRN

## 2023-03-13 MED ORDER — PROCHLORPERAZINE MALEATE 5 MG PO TABS: 5.0000 mg | ORAL_TABLET | Freq: Four times a day (QID) | ORAL | Status: DC | PRN

## 2023-03-13 MED ORDER — METOPROLOL TARTRATE 12.5 MG HALF TABLET
12.5000 mg | ORAL_TABLET | Freq: Two times a day (BID) | ORAL | Status: DC
  Administered 2023-03-13 – 2023-03-26 (×24): 12.5 mg via ORAL
  Filled 2023-03-13 (×26): qty 1

## 2023-03-13 MED ORDER — ASPIRIN 81 MG PO TBEC
81.0000 mg | DELAYED_RELEASE_TABLET | Freq: Every day | ORAL | Status: DC
  Administered 2023-03-14 – 2023-03-26 (×13): 81 mg via ORAL
  Filled 2023-03-13 (×13): qty 1

## 2023-03-13 MED ORDER — TRAZODONE HCL 50 MG PO TABS: 25.0000 mg | ORAL_TABLET | Freq: Every evening | ORAL | Status: DC | PRN

## 2023-03-13 MED ORDER — ACETAMINOPHEN 325 MG PO TABS
325.0000 mg | ORAL_TABLET | ORAL | Status: DC | PRN
  Administered 2023-03-23: 650 mg via ORAL
  Filled 2023-03-13: qty 2

## 2023-03-13 MED ORDER — PROCHLORPERAZINE EDISYLATE 10 MG/2ML IJ SOLN: 5.0000 mg | Freq: Four times a day (QID) | INTRAMUSCULAR | Status: DC | PRN

## 2023-03-13 MED ORDER — PANTOPRAZOLE SODIUM 40 MG PO TBEC
40.0000 mg | DELAYED_RELEASE_TABLET | Freq: Every day | ORAL | Status: DC
  Administered 2023-03-14 – 2023-03-26 (×13): 40 mg via ORAL
  Filled 2023-03-13 (×13): qty 1

## 2023-03-13 MED ORDER — OXYCODONE HCL ER 10 MG PO T12A
10.0000 mg | EXTENDED_RELEASE_TABLET | Freq: Two times a day (BID) | ORAL | Status: DC
  Administered 2023-03-13 – 2023-03-26 (×26): 10 mg via ORAL
  Filled 2023-03-13 (×26): qty 1

## 2023-03-13 NOTE — Progress Notes (Signed)
PMR Admission Coordinator Pre-Admission Assessment   Patient: [name] is an 72 y.o., male MRN: [id number] DOB: [birth year] Height: 6' (182.9 cm) Weight: 100 kg                                                                                                                                                  Insurance Information HMO:     PPO:      PCP:      IPA:      80/20:      OTHER:  PRIMARY: U[employer] Medicare      Policy#: [id number]      Subscriber: Pt CM Name:       Phone#: [phone]     Fax#: [phone] Pre-Cert#: [id number]       Employer: Pt. Approved for admit 03/13/23 for admit 03/13/23 for 7 days (update due 03/19/23) Eff Date: 12/25/2022 - 12/25/2023 Deductible: $0 (does not have deductible) OOP Max: $3,600 ($1,916.61 met)  CIR: $295/day co-pay for days 1-5, $0/day co-pay for days 6+  SNF: $0.00 Copayment per day for days 1-20; $203.00 Copayment per day for days 21-100 for Medicare-covered care/maximum 100 days/benefit period  Outpatient: $20/visit co-pay  Home Health:  100% coverage; limited by medical necessity  DME: 80% coverage; 20% co-insurance  Providers: in network   SECONDARY:       Policy#:       Phone#:      Development worker, community:       Phone#:    The Engineer, petroleum" for patients in Inpatient Rehabilitation Facilities with attached "Privacy Act [place] Records" was provided and verbally reviewed with: Patient   Emergency Contact Information Contact Information       Name Relation Home Work Mobile    [place] Sister [phone]   [phone]         Current Medical History  Patient Admitting Diagnosis: AKA  History of Present Illness: [name] is a 72 y.o. male with a history of PAD and a chronic lower extremity wound status post multiple revascularization procedures, prostate cancer, chronic kidney disease who had further progression of his vascular disease which ultimately required a right above-knee  amputation on March 09, 2023 by Dr. Donzetta Matters. Hospital course complicated by AKI on CKD d/t hydronephrosis. Pt is voiding. IV ceftriaxone completed for RLE cellulitis. Pt has had persistent anemia which is felt to be acute on chronic. LFT's still sl elevated, ?d/t rhabdomyolysis. Pt has been working with therapy and as of 3/16 was mod assist for transfers with RW and min to mod assist with BADL's.  PT and OT are recommending CIR to assist return to PLOF.  Glasgow Coma Scale Score: 15   Patient's medical record from [facility] has been reviewed by the rehabilitation admission coordinator and physician.   Past Medical History  Past Medical History:  Diagnosis Date   Family history of metabolic acidosis with increased anion gap 03/05/2023   GERD (gastroesophageal reflux disease)     History of kidney stones     Hyperlipidemia     Hypertension     Prostate cancer [facility]) 2011    radiation june 2020   Umbilical hernia     Wears dentures      full   Wears glasses     Wears partial dentures      lower      Has the patient had major surgery during 100 days prior to admission? Yes   Family History  family history includes Gastric cancer in his mother; Prostate cancer in his father.     Current Medications    Current Facility-Administered Medications:    0.9 %  sodium chloride infusion, 250 mL, Intravenous, PRN, Theda Sers, Emma M, PA-C, Stopped at 03/06/23 1725   acetaminophen (TYLENOL) tablet 650 mg, 650 mg, Oral, Q6H PRN, 650 mg at 03/13/23 1103 **OR** acetaminophen (TYLENOL) suppository 650 mg, 650 mg, Rectal, Q6H PRN, Theda Sers, Emma M, PA-C   albuterol (PROVENTIL) (2.5 MG/3ML) 0.083% nebulizer solution 2.5 mg, 2.5 mg, Nebulization, Q6H PRN, Theda Sers, Emma M, PA-C   aspirin EC tablet 81 mg, 81 mg, Oral, Daily, Laurence Slate M, PA-C, 81 mg at 03/13/23 1025   bisacodyl (DULCOLAX) EC tablet 5 mg, 5 mg, Oral, Daily PRN, Mansy, Jan A, MD, 5 mg at 03/10/23 0601   cefTRIAXone  (ROCEPHIN) 2 g in sodium chloride 0.9 % 100 mL IVPB, 2 g, Intravenous, Q24H, Collins, Emma M, PA-C, Last Rate: 200 mL/hr at 03/13/23 1032, 2 g at 03/13/23 1032   docusate sodium (COLACE) capsule 100 mg, 100 mg, Oral, BID PRN, Mansy, Jan A, MD, 100 mg at 03/10/23 0601   heparin injection 5,000 Units, 5,000 Units, Subcutaneous, Q8H, Collins, Emma M, PA-C, 5,000 Units at 03/13/23 0554   HYDROmorphone (DILAUDID) injection 0.5 mg, 0.5 mg, Intravenous, Q3H PRN, Wendee Beavers T, MD, 0.5 mg at 03/11/23 0048   labetalol (NORMODYNE) injection 10 mg, 10 mg, Intravenous, Q10 min PRN, Theda Sers, Emma M, PA-C   LORazepam (ATIVAN) tablet 0.25 mg, 0.25 mg, Oral, Q6H PRN, Theda Sers, Emma M, PA-C, 0.25 mg at 03/12/23 2041   metoprolol tartrate (LOPRESSOR) tablet 12.5 mg, 12.5 mg, Oral, BID, Collins, Emma M, PA-C, 12.5 mg at 03/13/23 1035   ondansetron (ZOFRAN) injection 4 mg, 4 mg, Intravenous, Q6H PRN, Theda Sers, Emma M, PA-C   oxyCODONE (Oxy IR/ROXICODONE) immediate release tablet 5-10 mg, 5-10 mg, Oral, Q4H PRN, Theda Sers, Emma M, PA-C, 10 mg at 03/12/23 2041   pantoprazole (PROTONIX) EC tablet 40 mg, 40 mg, Oral, Daily, Collins, Emma M, PA-C, 40 mg at 03/13/23 1025   sodium bicarbonate tablet 650 mg, 650 mg, Oral, TID, Cyndia Skeeters, Taye T, MD, 650 mg at 03/13/23 1025   sodium chloride flush (NS) 0.9 % injection 3 mL, 3 mL, Intravenous, Q12H, Collins, Emma M, PA-C, 3 mL at 03/12/23 2044   sodium chloride flush (NS) 0.9 % injection 3 mL, 3 mL, Intravenous, Q12H, Collins, Emma M, PA-C, 3 mL at 03/12/23 2044   sodium chloride flush (NS) 0.9 % injection 3 mL, 3 mL, Intravenous, PRN, Theda Sers, Emma M, PA-C   sodium phosphate (FLEET) 7-19 GM/118ML enema 1 enema, 1 enema, Rectal, Daily PRN, Mansy, Arvella Merles, MD   Patients Current Diet:  Diet Order  Diet Heart Room service appropriate? Yes; Fluid consistency: Thin  Diet effective now                         Precautions / Restrictions Precautions Precautions:  Fall Restrictions Weight Bearing Restrictions: Yes RLE Weight Bearing: Non weight bearing    Has the patient had 2 or more falls or a fall with injury in the past year?No   Prior Activity Level Community (5-7x/wk): pt. active in the community PTA   Prior Functional Level Prior Function Prior Level of Function : Independent/Modified Independent, Driving Mobility Comments: reports intermittent use of cane ADLs Comments: ind   Self Care: Did the patient need help bathing, dressing, using the toilet or eating?  Independent   Indoor Mobility: Did the patient need assistance with walking from room to room (with or without device)? Independent   Stairs: Did the patient need assistance with internal or external stairs (with or without device)? Independent   Functional Cognition: Did the patient need help planning regular tasks such as shopping or remembering to take medications? Independent   Patient Information Are you of Hispanic, Latino/a,or Spanish origin?: A. No, not of Hispanic, Latino/a, or Spanish origin What is your race?: B. Black or African American Do you need or want an interpreter to communicate with a doctor or health care staff?: 0. No   Patient's Response To:  Health Literacy and Transportation Is the patient able to respond to health literacy and transportation needs?: Yes Health Literacy - How often do you need to have someone help you when you read instructions, pamphlets, or other written material from your doctor or pharmacy?: Never In the past 12 months, has lack of transportation kept you from medical appointments or from getting medications?: No In the past 12 months, has lack of transportation kept you from meetings, work, or from getting things needed for daily living?: No   Home Assistive Devices / Equipment Home Equipment: BSC/3in1, Conservation officer, nature (2 wheels), Kasandra Knudsen - single point (family is working on getting pt a hospital bed)   Prior Device Use: Indicate  devices/aids used by the patient prior to current illness, exacerbation or injury? Walker   Current Functional Level Cognition   Overall Cognitive Status: Within Functional Limits for tasks assessed Orientation Level: Oriented X4 General Comments: at times tearful/emotional in regards to recent loss of limb    Extremity Assessment (includes Sensation/Coordination)   Upper Extremity Assessment: Overall WFL for tasks assessed  Lower Extremity Assessment: Defer to PT evaluation RLE Deficits / Details: ROM WFL, at least 3/5 based on observation of hip mobility     ADLs   Overall ADL's : Needs assistance/impaired Eating/Feeding: Set up Grooming: Set up, Sitting Upper Body Bathing: Min guard, Sitting Lower Body Bathing: Moderate assistance, Sitting/lateral leans Upper Body Dressing : Minimal assistance, Sitting Upper Body Dressing Details (indicate cue type and reason): changing into clean gown Lower Body Dressing: Moderate assistance Lower Body Dressing Details (indicate cue type and reason): donning sock on RLE Toilet Transfer: Moderate assistance, Stand-pivot, BSC/3in1, Rolling walker (2 wheels) Toilet Transfer Details (indicate cue type and reason): x2 to [facility] and to chair Toileting- Clothing Manipulation and Hygiene: Maximal assistance Functional mobility during ADLs: Moderate assistance, Minimal assistance, Rolling walker (2 wheels)     Mobility   Overal bed mobility: Needs Assistance Bed Mobility: Supine to Sit Supine to sit: Min guard     Transfers   Overall transfer level: Needs assistance Equipment used: Rolling  walker (2 wheels) Transfers: Sit to/from Stand, Bed to chair/wheelchair/BSC Sit to Stand: Min assist, Mod assist Bed to/from chair/wheelchair/BSC transfer type:: Stand pivot Stand pivot transfers: Mod assist, Min assist General transfer comment: mod A from lower surfaces, heel-toe pattern to get from bed to [facility], able to hop to transfer BSC to chair     Ambulation  / Gait / Stairs / Wheelchair Mobility   Ambulation/Gait Ambulation/Gait assistance: Herbalist (Feet): 3 Feet Assistive device: Rolling walker (2 wheels) Gait Pattern/deviations:  (hop-to gait) General Gait Details: hop-to gait with reduced L foot clearance Gait velocity: reuced Gait velocity interpretation: <1.31 ft/sec, indicative of household ambulator     Posture / Balance Balance Overall balance assessment: Needs assistance Sitting-balance support: No upper extremity supported, Feet supported Sitting balance-Leahy Scale: Good Standing balance support: Bilateral upper extremity supported, Reliant on assistive device for balance Standing balance-Leahy Scale: Poor     Special needs/care consideration Skin wound vac        Previous Home Environment (from acute therapy documentation) Living Arrangements: Alone  Lives With: Alone Available Help at Discharge: Family, Friend(s), Available PRN/intermittently (church family) Type of Home: House Home Layout: One level Home Access: Level entry, Stairs to enter Entrance Stairs-Rails: Right, Left Entrance Stairs-Number of Steps: 2 Bathroom Shower/Tub: Walk-in shower, Tub/shower unit (walk in shower is in bedroom, tub shower in hall bath) Bathroom Toilet: Standard (has high commode in hall bathroom) Additional Comments: sister is Therapist, sports, is unsure how much help he will have initially at d/c.   Discharge Living Setting Plans for Discharge Living Setting: Patient's home Type of Home at Discharge: House Discharge Home Layout: One level Discharge Home Access: Level entry, Stairs to enter Entrance Stairs-Rails: Right, Left Entrance Stairs-Number of Steps: 2 Discharge Bathroom Shower/Tub: Walk-in shower Discharge Bathroom Toilet: Standard Discharge Bathroom Accessibility: Yes   Social/Family/Support Systems Patient Roles: Other (Comment)     Goals Patient/Family Goal for Rehab: PT/OT/SLP Min A     Decrease burden of  Care through IP rehab admission: n/a      Possible need for SNF placement upon discharge:none      Patient Condition: This patient's condition remains as documented in the consult dated 03/12/23, in which the Rehabilitation Physician determined and documented that the patient's condition is appropriate for intensive rehabilitative care in an inpatient rehabilitation facility. Will admit to inpatient rehab today.   Preadmission Screen Completed By:  Genella Mech, CCC-SLP, 03/13/2023 1:01 PM ______________________________________________________________________   Discussed status with Dr. Naaman Plummer on 03/13/23 at 93  and received approval for admission today.   Admission Coordinator:  Genella Mech, time [id number] Sudie Grumbling 03/13/23

## 2023-03-13 NOTE — PMR Pre-admission (Signed)
PMR Admission Coordinator Pre-Admission Assessment  Patient: [name] is an 72 y.o., male MRN: [id number] DOB: Jul 06, 1951 Height: 6' (182.9 cm) Weight: 100 kg              Insurance Information HMO:     PPO:      PCP:      IPA:      80/20:      OTHER:  PRIMARY: U[employer] Medicare      Policy#: [id number]      Subscriber: Pt CM Name:       Phone#: [phone]     Fax#: [phone] Pre-Cert#: [id number]       Employer: Pt. Approved for admit 03/13/23 for admit 03/13/23 for 7 days (update due 03/19/23) Eff Date: 12/25/2022 - 12/25/2023 Deductible: $0 (does not have deductible) OOP Max: $3,600 ($1,916.61 met)  CIR: $295/day co-pay for days 1-5, $0/day co-pay for days 6+  SNF: $0.00 Copayment per day for days 1-20; $203.00 Copayment per day for days 21-100 for Medicare-covered care/maximum 100 days/benefit period  Outpatient: $20/visit co-pay  Home Health:  100% coverage; limited by medical necessity  DME: 80% coverage; 20% co-insurance  Providers: in network   SECONDARY:       Policy#:       Phone#:    Development worker, community:       Phone#:   The Engineer, petroleum" for patients in Inpatient Rehabilitation Facilities with attached "Privacy Act New Era Records" was provided and verbally reviewed with: Patient  Emergency Contact Information Contact Information     Name Relation Home Work Mobile   [place] Sister [phone]  [phone]      Current Medical History  Patient Admitting Diagnosis: AKA  History of Present Illness: [name] is a 72 y.o. male with a history of PAD and a chronic lower extremity wound status post multiple revascularization procedures, prostate cancer, chronic kidney disease who had further progression of his vascular disease which ultimately required a right above-knee amputation on March 09, 2023 by Dr. Donzetta Matters. Hospital course complicated by AKI on CKD d/t hydronephrosis. Pt is voiding. IV ceftriaxone completed for  RLE cellulitis. Pt has had persistent anemia which is felt to be acute on chronic. LFT's still sl elevated, ?d/t rhabdomyolysis. Pt has been working with therapy and as of 3/16 was mod assist for transfers with RW and min to mod assist with BADL's.  PT and OT are recommending CIR to assist return to PLOF.    Glasgow Coma Scale Score: 15  Patient's medical record from [facility] has been reviewed by the rehabilitation admission coordinator and physician.  Past Medical History  Past Medical History:  Diagnosis Date   Family history of metabolic acidosis with increased anion gap 03/05/2023   GERD (gastroesophageal reflux disease)    History of kidney stones    Hyperlipidemia    Hypertension    Prostate cancer [facility]) 2011   radiation june 2020   Umbilical hernia    Wears dentures    full   Wears glasses    Wears partial dentures    lower    Has the patient had major surgery during 100 days prior to admission? Yes  Family History  family history includes Gastric cancer in his mother; Prostate cancer in his father.   Current Medications   Current Facility-Administered Medications:    0.9 %  sodium chloride infusion, 250 mL, Intravenous, PRN, Ulyses Amor, PA-C, Stopped at 03/06/23 1725   acetaminophen (TYLENOL)  tablet 650 mg, 650 mg, Oral, Q6H PRN, 650 mg at 03/13/23 1103 **OR** acetaminophen (TYLENOL) suppository 650 mg, 650 mg, Rectal, Q6H PRN, Theda Sers, Emma M, PA-C   albuterol (PROVENTIL) (2.5 MG/3ML) 0.083% nebulizer solution 2.5 mg, 2.5 mg, Nebulization, Q6H PRN, Theda Sers, Emma M, PA-C   aspirin EC tablet 81 mg, 81 mg, Oral, Daily, Laurence Slate M, PA-C, 81 mg at 03/13/23 1025   bisacodyl (DULCOLAX) EC tablet 5 mg, 5 mg, Oral, Daily PRN, Mansy, Jan A, MD, 5 mg at 03/10/23 0601   cefTRIAXone (ROCEPHIN) 2 g in sodium chloride 0.9 % 100 mL IVPB, 2 g, Intravenous, Q24H, Collins, Emma M, PA-C, Last Rate: 200 mL/hr at 03/13/23 1032, 2 g at 03/13/23 1032   docusate  sodium (COLACE) capsule 100 mg, 100 mg, Oral, BID PRN, Mansy, Jan A, MD, 100 mg at 03/10/23 0601   heparin injection 5,000 Units, 5,000 Units, Subcutaneous, Q8H, Collins, Emma M, PA-C, 5,000 Units at 03/13/23 0554   HYDROmorphone (DILAUDID) injection 0.5 mg, 0.5 mg, Intravenous, Q3H PRN, Wendee Beavers T, MD, 0.5 mg at 03/11/23 0048   labetalol (NORMODYNE) injection 10 mg, 10 mg, Intravenous, Q10 min PRN, Theda Sers, Emma M, PA-C   LORazepam (ATIVAN) tablet 0.25 mg, 0.25 mg, Oral, Q6H PRN, Theda Sers, Emma M, PA-C, 0.25 mg at 03/12/23 2041   metoprolol tartrate (LOPRESSOR) tablet 12.5 mg, 12.5 mg, Oral, BID, Collins, Emma M, PA-C, 12.5 mg at 03/13/23 1035   ondansetron (ZOFRAN) injection 4 mg, 4 mg, Intravenous, Q6H PRN, Theda Sers, Emma M, PA-C   oxyCODONE (Oxy IR/ROXICODONE) immediate release tablet 5-10 mg, 5-10 mg, Oral, Q4H PRN, Theda Sers, Emma M, PA-C, 10 mg at 03/12/23 2041   pantoprazole (PROTONIX) EC tablet 40 mg, 40 mg, Oral, Daily, Collins, Emma M, PA-C, 40 mg at 03/13/23 1025   sodium bicarbonate tablet 650 mg, 650 mg, Oral, TID, Cyndia Skeeters, Taye T, MD, 650 mg at 03/13/23 1025   sodium chloride flush (NS) 0.9 % injection 3 mL, 3 mL, Intravenous, Q12H, Collins, Emma M, PA-C, 3 mL at 03/12/23 2044   sodium chloride flush (NS) 0.9 % injection 3 mL, 3 mL, Intravenous, Q12H, Collins, Emma M, PA-C, 3 mL at 03/12/23 2044   sodium chloride flush (NS) 0.9 % injection 3 mL, 3 mL, Intravenous, PRN, Theda Sers, Emma M, PA-C   sodium phosphate (FLEET) 7-19 GM/118ML enema 1 enema, 1 enema, Rectal, Daily PRN, Mansy, Arvella Merles, MD  Patients Current Diet:  Diet Order             Diet Heart Room service appropriate? Yes; Fluid consistency: Thin  Diet effective now                   Precautions / Restrictions Precautions Precautions: Fall Restrictions Weight Bearing Restrictions: Yes RLE Weight Bearing: Non weight bearing   Has the patient had 2 or more falls or a fall with injury in the past year?No  Prior  Activity Level Community (5-7x/wk): pt. active in the community PTA  Prior Functional Level Prior Function Prior Level of Function : Independent/Modified Independent, Driving Mobility Comments: reports intermittent use of cane ADLs Comments: ind  Self Care: Did the patient need help bathing, dressing, using the toilet or eating?  Independent  Indoor Mobility: Did the patient need assistance with walking from room to room (with or without device)? Independent  Stairs: Did the patient need assistance with internal or external stairs (with or without device)? Independent  Functional Cognition: Did the patient need help planning regular tasks  such as shopping or remembering to take medications? Independent  Patient Information Are you of Hispanic, Latino/a,or Spanish origin?: A. No, not of Hispanic, Latino/a, or Spanish origin What is your race?: B. Black or African American Do you need or want an interpreter to communicate with a doctor or health care staff?: 0. No  Patient's Response To:  Health Literacy and Transportation Is the patient able to respond to health literacy and transportation needs?: Yes Health Literacy - How often do you need to have someone help you when you read instructions, pamphlets, or other written material from your doctor or pharmacy?: Never In the past 12 months, has lack of transportation kept you from medical appointments or from getting medications?: No In the past 12 months, has lack of transportation kept you from meetings, work, or from getting things needed for daily living?: No  Home Assistive Devices / Equipment Home Equipment: BSC/3in1, Conservation officer, nature (2 wheels), Kasandra Knudsen - single point (family is working on getting pt a hospital bed)  Prior Device Use: Indicate devices/aids used by the patient prior to current illness, exacerbation or injury? Walker  Current Functional Level Cognition  Overall Cognitive Status: Within Functional Limits for tasks  assessed Orientation Level: Oriented X4 General Comments: at times tearful/emotional in regards to recent loss of limb    Extremity Assessment (includes Sensation/Coordination)  Upper Extremity Assessment: Overall WFL for tasks assessed  Lower Extremity Assessment: Defer to PT evaluation RLE Deficits / Details: ROM WFL, at least 3/5 based on observation of hip mobility    ADLs  Overall ADL's : Needs assistance/impaired Eating/Feeding: Set up Grooming: Set up, Sitting Upper Body Bathing: Min guard, Sitting Lower Body Bathing: Moderate assistance, Sitting/lateral leans Upper Body Dressing : Minimal assistance, Sitting Upper Body Dressing Details (indicate cue type and reason): changing into clean gown Lower Body Dressing: Moderate assistance Lower Body Dressing Details (indicate cue type and reason): donning sock on RLE Toilet Transfer: Moderate assistance, Stand-pivot, BSC/3in1, Rolling walker (2 wheels) Toilet Transfer Details (indicate cue type and reason): x2 to [facility] and to chair Toileting- Clothing Manipulation and Hygiene: Maximal assistance Functional mobility during ADLs: Moderate assistance, Minimal assistance, Rolling walker (2 wheels)    Mobility  Overal bed mobility: Needs Assistance Bed Mobility: Supine to Sit Supine to sit: Min guard    Transfers  Overall transfer level: Needs assistance Equipment used: Rolling walker (2 wheels) Transfers: Sit to/from Stand, Bed to chair/wheelchair/BSC Sit to Stand: Min assist, Mod assist Bed to/from chair/wheelchair/BSC transfer type:: Stand pivot Stand pivot transfers: Mod assist, Min assist General transfer comment: mod A from lower surfaces, heel-toe pattern to get from bed to [facility], able to hop to transfer BSC to chair    Ambulation / Gait / Stairs / Wheelchair Mobility  Ambulation/Gait Ambulation/Gait assistance: Herbalist (Feet): 3 Feet Assistive device: Rolling walker (2 wheels) Gait Pattern/deviations:   (hop-to gait) General Gait Details: hop-to gait with reduced L foot clearance Gait velocity: reuced Gait velocity interpretation: <1.31 ft/sec, indicative of household ambulator    Posture / Balance Balance Overall balance assessment: Needs assistance Sitting-balance support: No upper extremity supported, Feet supported Sitting balance-Leahy Scale: Good Standing balance support: Bilateral upper extremity supported, Reliant on assistive device for balance Standing balance-Leahy Scale: Poor    Special needs/care consideration Skin wound vac     Previous Home Environment (from acute therapy documentation) Living Arrangements: Alone  Lives With: Alone Available Help at Discharge: Family, Friend(s), Available PRN/intermittently (church family) Type of Home: [place]  Layout: One level Home Access: Level entry, Stairs to enter Entrance Stairs-Rails: Right, Left Entrance Stairs-Number of Steps: 2 Bathroom Shower/Tub: Walk-in shower, Tub/shower unit (walk in shower is in bedroom, tub shower in hall bath) Bathroom Toilet: Standard (has high commode in hall bathroom) Additional Comments: sister is Therapist, sports, is unsure how much help he will have initially at d/c.  Discharge Living Setting Plans for Discharge Living Setting: Patient's home Type of Home at Discharge: House Discharge Home Layout: One level Discharge Home Access: Level entry, Stairs to enter Entrance Stairs-Rails: Right, Left Entrance Stairs-Number of Steps: 2 Discharge Bathroom Shower/Tub: Walk-in shower Discharge Bathroom Toilet: Standard Discharge Bathroom Accessibility: Yes  Social/Family/Support Systems Patient Roles: Other (Comment)   Goals Patient/Family Goal for Rehab: PT/OT/SLP Min A   Decrease burden of Care through IP rehab admission: n/a    Possible need for SNF placement upon discharge:none    Patient Condition: This patient's condition remains as documented in the consult dated 03/12/23, in which the  Rehabilitation Physician determined and documented that the patient's condition is appropriate for intensive rehabilitative care in an inpatient rehabilitation facility. Will admit to inpatient rehab today.  Preadmission Screen Completed By:  Genella Mech, CCC-SLP, 03/13/2023 1:01 PM ______________________________________________________________________   Discussed status with Dr. Naaman Plummer on 03/13/23 at 56  and received approval for admission today.  Admission Coordinator:  Genella Mech, time [id number] Sudie Grumbling 03/13/23

## 2023-03-13 NOTE — Progress Notes (Signed)
Pt has requested to have dinner in his room before he transfers to 4w23. This nurse notified Ed RN of the plan.

## 2023-03-13 NOTE — Progress Notes (Signed)
Inpatient Rehabilitation Admission Medication Review by a Pharmacist  A complete drug regimen review was completed for this patient to identify any potential clinically significant medication issues.  High Risk Drug Classes Is patient taking? Indication by Medication  Antipsychotic Yes PRN prochlorperazine - n/v  Anticoagulant Yes Enoxaparin - VTE prophylaxis  Antibiotic No Completed 5 days of Ceftriaxone this morning.  Opioid Yes OxyContin q12h and prn Oxycodone - pain control  Antiplatelet Yes Aspirin 81 mg - PAD  Hypoglycemics/insulin No   Vasoactive Medication Yes Metoprolol - blood pressure  Chemotherapy No   Other Yes Pantoprazole - GI prophylaxis Sodium bicarbonate - acidosis Iron - supplement Prosource, Juven - supplements  PRNs: Lorazepam - anxiety Trazodone - sleep Cylcobenazprine - muscle spasms Docusate, miralax, bisacodyl or fleets emema - laxatives Acetaminophen - mild pain Albuterol nebs - wheezing or shortness of breath Maalox - indigestion Diphenhydramine - itching Guaifenesin DM - cough     Type of Medication Issue Identified Description of Issue Recommendation(s)  Drug Interaction(s) (clinically significant)     Duplicate Therapy     Allergy     No Medication Administration End Date     Incorrect Dose     Additional Drug Therapy Needed     Significant med changes from prior encounter (inform family/care partners about these prior to discharge). Prior Lisinopril-HCTZ, meloxicam, furosemide, KCl supplement discontinued due to AKI. Off Oxybutinin XL, Atrovastatin, esomeprazole (now on Pantoprazole) discontinued.    New OxyContin and iron supplement. Resume as indicated.   Atorvastatin was held with elevated CKs due to rhabdomyolysis > back into upper normal range today. Last AST/ALT slightly elevated. Resume when LFTs normal.  Review med changes with patient prior to discharge.  Other  DC summary indicates plan to resume Tylenol #3 BID and  multivitamin No Tylenol #3, changed to OxyContin Resume multivitamin during CIR admit or at discharge.    Clinically significant medication issues were identified that warrant physician communication and completion of prescribed/recommended actions by midnight of the next day:  No  Pharmacist comments:  - Noted may consider resuming Gabapentin.  Reported not taking on admission.  Last prescription for 100 mg TID was filled on 05/01/2022.   Time spent performing this drug regimen review (minutes):  [id number]   Arty Baumgartner, [place] 03/13/2023 8:15 PM

## 2023-03-13 NOTE — Discharge Summary (Signed)
Physician Discharge Summary  [name] [id number] DOB: 1951/02/23 DOA: 03/05/2023  PCP: Iona Beard, MD  Admit date: 03/05/2023 Discharge date: 03/13/2023 Admitted From: Home Disposition: CIR Recommendations for Outpatient Follow-up:  Follow up with vascular surgery as below Check CMP and CBC in 1 week Consider repeat renal ultrasound in 1 months for hydronephrosis. Please follow up on the following pending results: None   Discharge Condition: Stable CODE STATUS: Full code  Follow-up Information     [facility] Follow up in 1 month(s).   Why: The office will call the patient with an appointment Contact information: [address] [facility] course 72 year old M with PMH of PAD/nonhealing RLE wound s/p right CFA and PTA bypass in 11/2022, prostate cancer s/p rad, HTN, HLD, GERD and CKD-3A presenting with RLE pain.  Patient has been followed by vascular surgery and wound clinic for RLE wound and wound VAC.  He was found to have thrombosis of the graft on visit from 2/21 where ABI on the right was noted to be 0.  Evaluated by vascular surgery.  SFA occlusion with no tibial runoff on catheterization on 3/11.  Unfortunately, no revascularization option for his RLE.  Underwent right AKA on 03/09/2023 by Dr. Donzetta Matters.   Patient had fever and leukocytosis before surgery.  Source of infection felt to be from his right lower extremity.  Blood cultures negative.  He was started on IV ceftriaxone that he received from 3/15 to 3/19.  Fever and leukocytosis resolved.  Hospital course noteworthy of AKI likely from contrast, diuretics and mild rhabdo that has resolved.  See individual problem list below for more.   Problems addressed during this hospitalization Principal Problem:   Critical limb ischemia of right lower extremity (HCC) Active Problems:   Acute kidney injury superimposed on chronic  kidney disease (HCC)   Metabolic acidosis, increased anion gap   Hyperkalemia   Normocytic anemia   Essential hypertension   History of prostate cancer   Cellulitis of right lower extremity   Fever   Non-traumatic rhabdomyolysis   Bandemia   Critical ischemia of the right leg: s/p arteriogram that showed SFA occlusion with no tibial runoff. -S/p right AKA by Dr. Donzetta Matters on 3/15 -As needed Tylenol and oxycodone for pain control -Bowel regimen to counteract constipation -PT/OT-recommended CIR   AKI on CKD-3A: Renal US with moderate right and mild left hydronephrosis suggesting some obstructive etiology.  He was also on diuretics and ACE inhibitors.  Received contrast for aortogram as well.  CK was slightly elevated.  UA negative.  Treated with Foley catheter.  AKI improved.  Passed voiding trial on 3/16.  Oxybutynin and diuretics discontinued Recent Labs    03/05/23 1038 03/05/23 1846 03/06/23 0139 03/07/23 0142 03/08/23 0137 03/09/23 0224 03/10/23 0045 03/11/23 0208 03/12/23 0225 03/13/23 0209  BUN 136* 122* 113* 77* 50* 38* 37* 40* 35* 36*  CREATININE 4.42* 4.00* 3.56* 3.07* 2.90* 2.57* 2.37* 2.23* 1.80* 1.87*  -Repeat renal panel in 1 week -Consider repeat renal ultrasound in 1 month  Fever/leukocytosis/bandemia: Possible RLE cellulitis.  Now s/p right AKA. Blood cultures NGTD.  Last fever on 3/15.  Leukocytosis resolved. -Received ceftriaxone from 3/15-3/19   Normocytic anemia: Hgb reached nadir at 5.9 on 3/12.  Unclear cause of this.  No overt bleeding.  Transfused a total of 3 units so far.  Anemia panel suggests anemia  of chronic disease.  H&H stable. -Repeat CBC in 1 week  Hyperkalemia/metabolic acidosis: Hyperkalemia resolved.  Acidosis improved.   Essential hypertension: Normotensive -Continue low-dose metoprolol. -Discontinued diuretics and lisinopril.   Thrombocytosis: Likely reactive.  Resolved.   Mildly elevated liver enzymes: Likely due to rhabdo.   Improved. -Repeat LFT in 1 week   Nontraumatic rhabdomyolysis: Likely due to RLE ischemia/gangrene.  Resolved.   Hyperlipidemia:  -Continue statin.   H/o Prostate ca: S/p radiation therapy.    Anxiety:: Stable.   Mild hyponatremia: Resolved.           Vital signs Vitals:   03/12/23 2041 03/12/23 2043 03/13/23 0322 03/13/23 1035  BP: 113/61 113/61 113/62 (!) 129/95  Pulse: 88 79 83 100  Temp: 98.8 F (37.1 C) 98.8 F (37.1 C) 98.4 F (36.9 C)   Resp: 16 18 19    Height:      Weight:      SpO2: 100% 100% 92%   TempSrc: Oral Oral Oral   BMI (Calculated):         Discharge exam  GENERAL: No apparent distress.  Nontoxic. HEENT: MMM.  Vision and hearing grossly intact.  NECK: Supple.  No apparent JVD.  RESP:  No IWOB.  Fair aeration bilaterally. CVS:  RRR. Heart sounds normal.  ABD/GI/GU: BS+. Abd soft, NTND.  MSK/EXT: Right AKA.  Staples in place.  Wound appears clean. SKIN: no apparent skin lesion or wound NEURO: Awake and alert. Oriented appropriately.  No apparent focal neuro deficit. PSYCH: Calm. Normal affect.     Discharge Instructions Discharge Instructions     Diet - low sodium heart healthy   Complete by: As directed    Increase activity slowly   Complete by: As directed       Allergies as of 03/13/2023       Reactions   Shellfish Allergy Swelling        Medication List     STOP taking these medications    esomeprazole 20 MG capsule Commonly known as: [place] by: pantoprazole 40 MG tablet   furosemide 20 MG tablet Commonly known as: LASIX   gabapentin 100 MG capsule Commonly known as: NEURONTIN   lisinopril-hydrochlorothiazide 10-12.5 MG tablet Commonly known as: ZESTORETIC   meloxicam 15 MG tablet Commonly known as: MOBIC   oxybutynin 10 MG 24 hr tablet Commonly known as: DITROPAN-XL   potassium chloride SA 20 MEQ tablet Commonly known as: KLOR-CON M   Spikevax syringe Generic drug: COVID-19 mRNA vaccine  2023-2024       TAKE these medications    acetaminophen 325 MG tablet Commonly known as: TYLENOL Take 2 tablets (650 mg total) by mouth every 6 (six) hours as needed for mild pain (or Fever >/= 101).   acetaminophen-codeine 300-30 MG tablet Commonly known as: TYLENOL #3 Take 1 tablet by mouth 2 (two) times daily.   aspirin EC 81 MG tablet Take 81 mg by mouth daily.   atorvastatin 40 MG tablet Commonly known as: LIPITOR Take 1 tablet (40 mg total) by mouth every morning.   CENTRUM SILVER PO Take 1 tablet by mouth daily.   metoprolol tartrate 25 MG tablet Commonly known as: LOPRESSOR Take 0.5 tablets (12.5 mg total) by mouth 2 (two) times daily. What changed: See the new instructions.   oxyCODONE 5 MG immediate release tablet Commonly known as: Oxy IR/ROXICODONE Take 1-2 tablets (5-10 mg total) by mouth every 6 (six) hours as needed for severe pain.   pantoprazole 40 MG  tablet Commonly known as: PROTONIX Take 1 tablet (40 mg total) by mouth daily. Start taking on: March 14, 2023 Replaces: esomeprazole 20 MG capsule   polyethylene glycol powder 17 GM/SCOOP powder Commonly known as: MiraLax Take 17 g by mouth 2 (two) times daily as needed for mild constipation.   senna-docusate 8.6-50 MG tablet Commonly known as: Senokot-S Take 2 tablets by mouth 2 (two) times daily between meals as needed for moderate constipation.        Consultations: Vascular surgery  Procedures/Studies: Right AKA on 3/15   US RENAL  Result Date: 03/06/2023 CLINICAL DATA:  AKI. EXAM: RENAL / URINARY TRACT ULTRASOUND COMPLETE COMPARISON:  03/05/2019. FINDINGS: Right Kidney: Renal measurements: 12.9 x 6.5 x 6.2 cm = volume: 271 mL. Echogenicity within normal limits. Cysts are noted in the right kidney measuring 5.5 x 3.5 x 4.7 cm and 4.3 x 4.4 x 4.5 cm. There is moderate hydronephrosis. Left Kidney: Renal measurements: 10.1 x 6.0 x 6.0 cm = volume: 190 mL. Echogenicity within normal limits.  A cyst is present in the lower pole measuring 3.7 x 3.1 x 2.8 cm. Mild hydronephrosis is noted. Bladder: Appears normal for degree of bladder distention. Ureteral jets are not seen. Prevoid bladder volume is 321 mL. Postvoid bladder volume could not be obtained. Other: None. IMPRESSION: 1. Moderate hydronephrosis on the right and mild hydronephrosis on the left. 2. Bilateral renal cysts. Electronically Signed   By: Brett Fairy M.D.   On: 03/06/2023 03:36   PERIPHERAL VASCULAR CATHETERIZATION  Result Date: 03/05/2023 Images from the original result were not included. Patient name: [name] MRN: [id number] DOB: Jul 27, 1951 Sex: male 03/05/2023 Pre-operative Diagnosis: Chronic right lower extremity limb threatening ischemia with toe and foot gangrene Post-operative diagnosis:  Same Surgeon:  Eda Paschal. Donzetta Matters, MD Procedure Performed: 1.  Ultrasound-guided cannulation left common femoral artery 2.  Selection of right common femoral artery.  Right lower extremity CO2 angiography 3.  Ultrasound-guided cannulation right anterior tibial and posterior tibial arteries 4.  Moderate sedation with fentanyl and Versed for 36 minutes 5.  Mynx device closure left common femoral artery Indications: 72 year old male with a history of a right common femoral endarterectomy and femoral to posterior tibial artery bypass.  He has extensive wounds of the right lower extremity he has been counseled on the need for amputation but would like 1 last attempt at angiography with possible.  He now presents for angiography of the right lower extremity and his creatinine was severely elevated from baseline we elected to use CO2 and no contrast. Findings: There is a very strong common femoral pulse.  The common femoral artery was patulous consistent with previous endarterectomy there does not appear to be runoff into the SFA.  Distally in the leg we can only visualize a collateral below the knee.  I attempted to cannulate both the  anterior tibial and posterior tibial arteries but these were chronically thrombosed despite getting needles and the blood return there was no ability to pass a wire. Patient will be scheduled for right above-knee amputation when he is medically stable.  Procedure:  The patient was identified in the holding area and taken to room 2.  The patient was then placed supine on the table and prepped and draped in the usual sterile fashion.  A time out was called.  Ultrasound was used to evaluate the left common femoral artery which is patent and compressible.  The area was anesthetized 1% lidocaine cannulated by functional followed by wire  and sheath.  And images saved the permanent record.  Concomitantly we administered fentanyl and Versed for moderate sedation and his bedside vitals were monitored by nursing.  We placed a Bentson wire followed by a 5 [place] sheath.  We crossed the bifurcation with Omni catheter elected not to perform aortogram given that this was just performed 4 months ago.  Will perform CO2 angiography of the right lower extremity there was really no runoff identified.  With this I elected to prepped out the right foot.  I then used ultrasound guidance to cannulate the posterior tibial and anterior tibial arteries but there was no backbleeding and a wire was passed.  Given the extensive gangrene patient will need amputation above the knee on the right.  We remove the wire and then applied a minx device to the left common femoral artery.  Patient did tolerate procedure without any complication. Contrast: 36cc Brandon C. Donzetta Matters, MD [facility] Office: [phone] Pager: [phone]   VAS [place] ABI WITH/WO TBI  Result Date: 02/14/2023  LOWER EXTREMITY DOPPLER STUDY Patient Name:  [name]  Date of Exam:   02/14/2023 Medical Rec #: [id number]            Accession #:    [id number] Date of Birth: Jul 12, 1951            Patient Gender: M Patient Age:   30 years Exam  Location:  [facility] Procedure:      VAS [place] ABI WITH/WO TBI Referring Phys: TODD EARLY --------------------------------------------------------------------------------  Indications: Rest pain, and peripheral artery disease. High Risk Factors: Hypertension, hyperlipidemia, past history of smoking.  Vascular Interventions: 12/12/22: Right CFA patch angioplasty and CFA to PTA                         bypass with PTFE and distal s cm of reversed GSV. Performing Technologist: Ralene Cork RVT  Examination Guidelines: A complete evaluation includes at minimum, Doppler waveform signals and systolic blood pressure reading at the level of bilateral brachial, anterior tibial, and posterior tibial arteries, when vessel segments are accessible. Bilateral testing is considered an integral part of a complete examination. Photoelectric Plethysmograph (PPG) waveforms and toe systolic pressure readings are included as required and additional duplex testing as needed. Limited examinations for reoccurring indications may be performed as noted.  ABI Findings: +---------+------------------+-----+-------------------+-----------------------+ Right    Rt Pressure (mmHg)IndexWaveform           Comment                 +---------+------------------+-----+-------------------+-----------------------+ Brachial 113                                                               +---------+------------------+-----+-------------------+-----------------------+ PTA                             dampened monophasicUnna boot and wound vac +---------+------------------+-----+-------------------+-----------------------+ Great Toe0                 0.00 Absent                                     +---------+------------------+-----+-------------------+-----------------------+ +---------+------------------+-----+----------+-------+  Left     Lt Pressure (mmHg)IndexWaveform  Comment  +---------+------------------+-----+----------+-------+ Brachial 114                                      +---------+------------------+-----+----------+-------+ PTA      70                0.61 monophasic        +---------+------------------+-----+----------+-------+ DP       93                0.82 biphasic          +---------+------------------+-----+----------+-------+ Great Toe40                0.35                   +---------+------------------+-----+----------+-------+ +-------+------------+-----------+------------+------------+ ABI/TBIToday's ABI Today's TBIPrevious ABIPrevious TBI +-------+------------+-----------+------------+------------+ Right  Not acquired0          0.35        0            +-------+------------+-----------+------------+------------+ Left   0.82        0.35       0.83        0.48         +-------+------------+-----------+------------+------------+  Pre op ABI at [facility]:.  Summary:  Right: Abnormal waveform. No toe waveform. Patient with rest pain. Left: Resting left ankle-brachial index indicates mild left lower extremity arterial disease. The left toe-brachial index is abnormal. *See table(s) above for measurements and observations.  Electronically signed by Curt Jews MD on 02/14/2023 at 3:21:02 PM.    Final        The results of significant diagnostics from this hospitalization (including imaging, microbiology, ancillary and laboratory) are listed below for reference.     Microbiology: Recent Results (from the past 240 hour(s))  Surgical pcr screen     Status: None   Collection Time: 03/09/23  5:40 AM   Specimen: Nasal Mucosa; Nasal Swab  Result Value Ref Range Status   MRSA, PCR NEGATIVE NEGATIVE Final   Staphylococcus aureus NEGATIVE NEGATIVE Final    Comment: (NOTE) The Xpert SA Assay (FDA approved for NASAL specimens in patients 44 years of age and older), is one component of a comprehensive surveillance program. It is  not intended to diagnose infection nor to guide or monitor treatment. Performed at [facility], [place] [address] [place], [place] [address]   Culture, blood (Routine X 2) w Reflex to ID Panel     Status: None (Preliminary result)   Collection Time: 03/09/23  8:08 AM   Specimen: BLOOD  Result Value Ref Range Status   Specimen Description BLOOD SITE NOT SPECIFIED  Final   Special Requests   Final    BOTTLES DRAWN AEROBIC AND ANAEROBIC Blood Culture adequate volume   Culture   Final    NO GROWTH 4 DAYS Performed at [facility], [address] [place], [place] [address]    Report Status PENDING  Incomplete  Culture, blood (Routine X 2) w Reflex to ID Panel     Status: None (Preliminary result)   Collection Time: 03/09/23  8:15 AM   Specimen: BLOOD  Result Value Ref Range Status   Specimen Description BLOOD SITE NOT SPECIFIED  Final   Special Requests   Final    BOTTLES DRAWN AEROBIC AND ANAEROBIC Blood Culture adequate  volume   Culture   Final    NO GROWTH 4 DAYS Performed at [facility], [place] [address], [place], [place] [address]    Report Status PENDING  Incomplete     Labs:  CBC: Recent Labs  Lab 03/07/23 0142 03/08/23 0137 03/09/23 0224 03/10/23 0045 03/11/23 0208 03/11/23 1535 03/12/23 0225 03/13/23 0209  WBC 16.0*   < > 18.4* 17.4* 16.8*  --  10.9* 9.4  NEUTROABS 12.8*  --   --  16.4* 13.8*  --   --   --   HGB 8.2*   < > 8.0* 7.7* 7.4* 9.2* 9.0* 8.7*  HCT 23.9*   < > 23.7* 23.4* 23.0* 27.4* 27.8* 27.1*  MCV 84.2   < > 86.8 88.0 89.8  --  89.1 89.4  PLT 448*   < > 438* 437* 451*  --  442* 385   < > = values in this interval not displayed.   BMP &GFR Recent Labs  Lab 03/09/23 0224 03/10/23 0045 03/11/23 0208 03/12/23 0225 03/13/23 0209  NA 132* 134* 136 136 135  K 4.7 5.1 4.6 4.6 4.5  CL 106 105 105 107 102  CO2 18* 17* 20* 20* 21*  GLUCOSE 141* 185* 135* 114* 115*  BUN 38* 37* 40* 35* 36*  CREATININE 2.57* 2.37* 2.23* 1.80* 1.87*   CALCIUM 8.6* 8.9 9.0 9.0 8.9  MG 1.7 1.8 1.7 1.9 1.8  PHOS 2.7 4.3 2.8 3.3 3.2   Estimated Creatinine Clearance: 43.1 mL/min (A) (by C-G formula based on SCr of 1.87 mg/dL (H)). Liver & Pancreas: Recent Labs  Lab 03/09/23 0224 03/10/23 0045 03/11/23 0208 03/12/23 0225 03/13/23 0209  AST 74* 52* 57*  --   --   ALT 69* 62* 71*  --   --   ALKPHOS 102 105 108  --   --   BILITOT 0.5 0.5 0.2*  --   --   PROT 6.3* 6.1* 6.2*  --   --   ALBUMIN 1.9* 1.7* 1.8* 1.9* 1.9*   No results for input(s): "LIPASE", "AMYLASE" in the last 168 hours. No results for input(s): "AMMONIA" in the last 168 hours. Diabetic: No results for input(s): "HGBA1C" in the last 72 hours. No results for input(s): "GLUCAP" in the last 168 hours. Cardiac Enzymes: Recent Labs  Lab 03/09/23 0224 03/10/23 0045 03/11/23 0208 03/13/23 0209  CKTOTAL 1,792* 910* 516* 369   No results for input(s): "PROBNP" in the last 8760 hours. Coagulation Profile: No results for input(s): "INR", "PROTIME" in the last 168 hours. Thyroid Function Tests: No results for input(s): "TSH", "T4TOTAL", "FREET4", "T3FREE", "THYROIDAB" in the last 72 hours. Lipid Profile: No results for input(s): "CHOL", "HDL", "LDLCALC", "TRIG", "CHOLHDL", "LDLDIRECT" in the last 72 hours. Anemia Panel: No results for input(s): "VITAMINB12", "FOLATE", "FERRITIN", "TIBC", "IRON", "RETICCTPCT" in the last 72 hours. Urine analysis:    Component Value Date/Time   COLORURINE YELLOW 03/06/2023 1420   APPEARANCEUR CLEAR 03/06/2023 1420   LABSPEC 1.010 03/06/2023 1420   PHURINE 8.0 03/06/2023 1420   GLUCOSEU NEGATIVE 03/06/2023 1420   HGBUR SMALL (A) 03/06/2023 1420   BILIRUBINUR NEGATIVE 03/06/2023 1420   KETONESUR NEGATIVE 03/06/2023 1420   PROTEINUR NEGATIVE 03/06/2023 1420   NITRITE NEGATIVE 03/06/2023 1420   LEUKOCYTESUR NEGATIVE 03/06/2023 1420   Sepsis Labs: Invalid input(s): "PROCALCITONIN", "LACTICIDVEN"   SIGNED:  Mercy Riding,  MD  [facility] 03/13/2023, 1:30 PM

## 2023-03-13 NOTE — Consult Note (Signed)
   [facility] CM Inpatient Consult   03/13/2023  ERIC HEWSON 12-Feb-1951 [id number]  [place] [facility] [ACO] Patient: [employer] Medicare  Primary Care Provider:  Iona Beard, MD, [facility]   Patient screened for hospitalization with noted high risk score for unplanned readmission risk and length of stay and  to assess for potential [place] Management service needs for post hospital transition for care coordination.  Review of patient's electronic medical record reveals patient is approved for inpatient rehabilitation at [place]:  No current community care coordination needs noted, TOC provided by PCP for follow up from hospital for needs.  Of note, [facility] Care Management/Population Health does not replace or interfere with any arrangements made by the Inpatient Transition of Care team.  For questions contact:   Natividad Brood, RN BSN [place]  [phone] business mobile phone Toll free office [phone]  *[place]  [phone] Fax number: [phone] [email or web address]

## 2023-03-13 NOTE — Progress Notes (Signed)
Mobility Specialist Progress Note:   03/13/23 1330  Mobility  Activity Transferred from chair to bed  Level of Assistance Minimal assist, patient does 75% or more  Assistive Device Front wheel walker  Distance Ambulated (ft) 3 ft  Activity Response Tolerated well  Mobility Referral Yes  $Mobility charge 1 Mobility   Pt requesting to transfer back to bed. Required heavy minA to stand from low recliner. MinG assist required to take hop-steps with RW to bed. Pt back in bed with all needs met, alarm on.  Nelta Numbers Mobility Specialist Please contact via SecureChat or  Rehab office at [phone]

## 2023-03-13 NOTE — TOC Transition Note (Signed)
Transition of Care [facility]) - CM/SW Discharge Note   Patient Details  Name: [name] MRN: [id number] Date of Birth: [birth year]  Transition [facility] Care [facility] CM/SW Contact:  Bethena Roys, RN Phone Number: 03/13/2023, 1:54 PM   Clinical Narrative:  Case Manager received notification that the patient was approved via insurance for CIR. Bed is available and the patient will transition to the unit today. No further needs identified at this time.   Final next level of care: IP Rehab Facility Barriers to Discharge: No Barriers Identified   Discharge Plan and Services Additional resources added to the After Visit Summary for   In-house Referral: NA Discharge Planning Services: CM Consult Post Acute Care Choice: [place]            DME Agency: NA     Social Determinants of Health (SDOH) Interventions SDOH Screenings   Food Insecurity: No Food Insecurity (12/12/2022)  Housing: Low Risk  (12/12/2022)  Transportation Needs: No Transportation Needs (12/12/2022)  Utilities: Not At Risk (12/12/2022)  Tobacco Use: Medium Risk (03/11/2023)   Readmission Risk Interventions     No data to display

## 2023-03-13 NOTE — H&P (Signed)
Physical Medicine and Rehabilitation Admission H&P    CC: Functional deficits due to R-AKA   HPI: [name] is a 72 year old male with history of HTN, prostate CA, GERD, CKD, PAD with right ankle ulcer s/p Fem-tib bypass for critical limb ischemia who had failure of graft with post op edema, significant pain and extensive wounds. He was admitted on 03/05/23 for R-AKA by Dr. Donzetta Matters. Limb not felt to be salvageable and he was admitted on 03/11 for attempt at angiography which showed chronically thrombosed anterior/posterior tibial arteries. Post procedure with drop in Hgb to 5.9 and was transfused with 2 units PRBC. He continued to have difficulty processing limb loss with multiple conversations. He developed acute on chronic renal failure 03/12 and was found to have moderate right and mild left hydronephrosis therefore foley placed.  On 03/14, he developed fever- 101F,  tachycardia, leucocytosis with WBC- 18.4 and noted to have elevated [id number] (question rhabdomyolysis).  He was started on IV ceftriaxone for RLE cellulitis and underwent R-AKA on 03/15. ABLA with hgb drop to 7.4 treated with one unit PRBC on 03/17 and completes antibiotic course today. As AKI resolving, foley was discontinued and passed voiding trial. PT/OT has been working with patient who continues to be limited by pain, fear, anxiety and weakness.  He has had decline in functional status and CIR recommended by rehab team.   Prior to admission, patient lived alone and was independent and working prior to 11/2022. Has friends and church family who can assist after discharge.   Pt reports pain is 8-10/10- and extremely painful- he actually says pain >10/10- however explained 10/10 is the max.  He notes that meds help improve things down to 5-7/10 at best, but then it comes back when pain meds wear off.  Having a lot of phantom pain and sensation- can feel foot and it's painful.  LBM today- depends on intake.  No SOB  except when he gets anxious, and then that's doing better.  He does NOT want to look at amputation and it REALLY bothers him.     Review of Systems  Constitutional:  Negative for chills and fever.  HENT: Negative.    Respiratory:  Negative for cough and shortness of breath.   Cardiovascular:  Negative for chest pain, orthopnea and claudication.  Gastrointestinal:  Positive for heartburn. Negative for constipation.  Genitourinary:  Negative for dysuria and frequency.  Musculoskeletal:  Positive for joint pain. Negative for myalgias.  Skin:  Negative for rash.  Neurological:  Positive for sensory change and weakness. Negative for dizziness.  Endo/Heme/Allergies: Negative.   Psychiatric/Behavioral:  Negative for suicidal ideas. The patient is nervous/anxious. The patient does not have insomnia.     Past Medical History:  Diagnosis Date   Family history of metabolic acidosis with increased anion gap 03/05/2023   GERD (gastroesophageal reflux disease)    History of kidney stones    Hyperlipidemia    Hypertension    Prostate cancer [facility]) 2011   radiation june 2020   Umbilical hernia    Wears dentures    full   Wears glasses    Wears partial dentures    lower    Past Surgical History:  Procedure Laterality Date   ABDOMINAL AORTOGRAM W/LOWER EXTREMITY N/A 11/20/2022   Procedure: ABDOMINAL AORTOGRAM W/LOWER EXTREMITY;  Surgeon: Waynetta Sandy, MD;  Location: [place] [facility];  Service: Cardiovascular;  Laterality: N/A;   ABDOMINAL AORTOGRAM W/LOWER EXTREMITY N/A 03/05/2023  Procedure: ABDOMINAL AORTOGRAM W/LOWER EXTREMITY;  Surgeon: Waynetta Sandy, MD;  Location: [place] [facility];  Service: Cardiovascular;  Laterality: N/A;   AMPUTATION Right 03/09/2023   Procedure: AMPUTATION ABOVE KNEE;  Surgeon: Waynetta Sandy, MD;  Location: [place];  Service: Vascular;  Laterality: Right;   COLONOSCOPY N/A 08/25/2020   Procedure: COLONOSCOPY;  Surgeon: Daneil Dolin, MD;  [facility];  Service: Endoscopy;  Laterality: N/A;  9:30   COLONOSCOPY     CYSTOSCOPY WITH LITHOLAPAXY N/A 05/18/2021   Procedure: CYSTOSCOPY WITH LITHOLAPAXY;  Surgeon: Ceasar Mons, MD;  Location: [facility];  Service: Urology;  Laterality: N/A;  ONLY NEEDS  30 MIN   FEMORAL-TIBIAL BYPASS GRAFT Right 12/12/2022   Procedure: RIGHT COMMON FEMORAL-POSTERIOR TIBIAL ARTERY BYPASS WITH VEIN HARVESTING OF THE GREATER SAPHENOUS VEIN AND FEMORAL ENDARTERECTOMY WITH COMPOSITE GRAFT;  Surgeon: Waynetta Sandy, MD;  Location: [place];  Service: Vascular;  Laterality: Right;   POLYPECTOMY  08/25/2020   Procedure: POLYPECTOMY;  Surgeon: Daneil Dolin, MD;  [facility];  Service: Endoscopy;;   ROBOT ASSISTED LAPAROSCOPIC RADICAL PROSTATECTOMY  2011   TONSILLECTOMY  age 55   and adenoids removed    Family History  Problem Relation Age of Onset   Gastric cancer Mother    Prostate cancer Father    Breast cancer Neg Hx    Pancreatic cancer Neg Hx     Social History:  Lives alone. He was independent PTA. He  reports that he quit smoking about 10/2022.  His smoking use included cigarettes. He has a 5.25 pack-year smoking history. He has never used smokeless tobacco. He reports that he does not drink alcohol and does not use drugs.   Allergies  Allergen Reactions   Shellfish Allergy Swelling    Facility-Administered Medications Prior to Admission  Medication Dose Route Frequency Provider Last Rate Last Admin   betamethasone acetate-betamethasone sodium phosphate (CELESTONE) injection 3 mg  3 mg Intra-articular Once Daylene Katayama M, DPM       betamethasone acetate-betamethasone sodium phosphate (CELESTONE) injection 3 mg  3 mg Intra-articular Once Edrick Kins, DPM       Medications Prior to Admission  Medication Sig Dispense Refill   acetaminophen-codeine (TYLENOL #3) 300-30 MG tablet Take 1 tablet by mouth 2 (two) times  daily.     aspirin EC 81 MG tablet Take 81 mg by mouth daily.     atorvastatin (LIPITOR) 40 MG tablet Take 1 tablet (40 mg total) by mouth every morning. 30 tablet 3   esomeprazole (NEXIUM) 20 MG capsule Take 20 mg by mouth daily.     furosemide (LASIX) 20 MG tablet Take 20 mg by mouth daily.     lisinopril-hydrochlorothiazide (PRINZIDE,ZESTORETIC) 10-12.5 MG tablet Take 1 tablet by mouth daily.     meloxicam (MOBIC) 15 MG tablet Take 1 tablet by mouth once daily 30 tablet 0   metoprolol tartrate (LOPRESSOR) 25 MG tablet Take 1/2 (one-half) tablet by mouth twice daily 30 tablet 0   Multiple Vitamins-Minerals (CENTRUM SILVER PO) Take 1 tablet by mouth daily.      oxybutynin (DITROPAN-XL) 10 MG 24 hr tablet Take 10 mg by mouth daily.     potassium chloride SA (KLOR-CON M) 20 MEQ tablet Take 10 mEq by mouth daily.     gabapentin (NEURONTIN) 100 MG capsule Take 1 capsule (100 mg total) by mouth 3 (three) times daily. (Patient not taking: Reported on 02/28/2023) 90 capsule  3   SPIKEVAX syringe         Home: Home Living Family/patient expects to be discharged to:: Private residence Living Arrangements: Alone Available Help at Discharge: Family, Friend(s), Available PRN/intermittently (church family) Type of Home: House Home Access: Level entry, Stairs to enter [employer] of Steps: 2 Entrance Stairs-Rails: Right, Left Home Layout: One level Bathroom Shower/Tub: Walk-in shower, Tub/shower unit (walk in shower is in bedroom, tub shower in hall bath) Bathroom Toilet: Standard (has high commode in hall bathroom) Home Equipment: BSC/3in1, Conservation officer, nature (2 wheels), [employer] - single point (family is working on getting pt a hospital bed) Additional Comments: sister is Therapist, sports, is unsure how much help he will have initially at d/c.  Lives With: Alone   Functional History: Prior Function Prior Level of Function : Independent/Modified Independent, Driving Mobility Comments: reports intermittent  use of cane ADLs Comments: ind  Functional Status:  Mobility: Bed Mobility Overal bed mobility: Needs Assistance Bed Mobility: Supine to Sit Supine to sit: Min guard Transfers Overall transfer level: Needs assistance Equipment used: Rolling walker (2 wheels) Transfers: Sit to/from Stand, Bed to chair/wheelchair/BSC Sit to Stand: Min assist, Mod assist Bed to/from chair/wheelchair/BSC transfer type:: Stand pivot Stand pivot transfers: Mod assist, Min assist General transfer comment: mod A from lower surfaces, heel-toe pattern to get from bed to [facility], able to hop to transfer BSC to chair Ambulation/Gait Ambulation/Gait assistance: Min assist Gait Distance (Feet): 3 Feet Assistive device: Rolling walker (2 wheels) Gait Pattern/deviations:  (hop-to gait) General Gait Details: hop-to gait with reduced L foot clearance Gait velocity: reuced Gait velocity interpretation: <1.31 ft/sec, indicative of household ambulator    ADL: ADL Overall ADL's : Needs assistance/impaired Eating/Feeding: Set up Grooming: Set up, Sitting Upper Body Bathing: Min guard, Sitting Lower Body Bathing: Moderate assistance, Sitting/lateral leans Upper Body Dressing : Minimal assistance, Sitting Upper Body Dressing Details (indicate cue type and reason): changing into clean gown Lower Body Dressing: Moderate assistance Lower Body Dressing Details (indicate cue type and reason): donning sock on RLE Toilet Transfer: Moderate assistance, Stand-pivot, BSC/3in1, Rolling walker (2 wheels) Toilet Transfer Details (indicate cue type and reason): x2 to [facility] and to chair Toileting- Clothing Manipulation and Hygiene: Maximal assistance Functional mobility during ADLs: Moderate assistance, Minimal assistance, Rolling walker (2 wheels)  Cognition: Cognition Overall Cognitive Status: Within Functional Limits for tasks assessed Orientation Level: Oriented X4 Cognition Arousal/Alertness: Awake/alert Behavior During  Therapy: WFL for tasks assessed/performed Overall Cognitive Status: Within Functional Limits for tasks assessed General Comments: at times tearful/emotional in regards to recent loss of limb   Blood pressure (!) 129/95, pulse 100, temperature 98.4 F (36.9 C), temperature source Oral, resp. rate 19, height 6' (1.829 m), weight 100 kg, SpO2 92 %. Physical Exam Vitals and nursing note reviewed. Exam conducted with a chaperone present.  Constitutional:      Appearance: Normal appearance.     Comments: Older gentleman sitting on bedside chair- working on his lunch, family member at bedside; irritated to be interrupted, NAD; awake, alert  HENT:     Head: Normocephalic and atraumatic.     Right Ear: External ear normal.     Left Ear: External ear normal.     Nose: Nose normal. No congestion.     Mouth/Throat:     Mouth: Mucous membranes are moist.     Pharynx: Oropharynx is clear. No oropharyngeal exudate.  Eyes:     General:        Right eye: No discharge.  Left eye: No discharge.     Extraocular Movements: Extraocular movements intact.  Cardiovascular:     Rate and Rhythm: Normal rate and regular rhythm.     Heart sounds: Normal heart sounds. No murmur heard.    No gallop.  Pulmonary:     Effort: Pulmonary effort is normal. No respiratory distress.     Breath sounds: Normal breath sounds. No wheezing, rhonchi or rales.  Abdominal:     General: Abdomen is flat. Bowel sounds are normal. There is no distension.     Palpations: Abdomen is soft.     Tenderness: There is no abdominal tenderness.  Genitourinary:    Penis: Normal.      Testes: Normal.  Musculoskeletal:     Cervical back: Neck supple. No tenderness.     Comments: R-AKA with multiple ecchymotic areas--question early blister as well as blister? inner thigh covered with foam dressing. However except ecchymoses, staples intact, doesn't have more than moderate swelling and very TTP as expected UE strength 5-/5  throughout B/L LLE- 5-/5 in HF, KE, KF, DF and PF RLE- NT per pt request    Skin:    General: Skin is warm and dry.     Comments: IV R forearm looks OK Inner groin/thigh dressing from prior bypass R AKA incision per the MS  Neurological:     Mental Status: He is alert and oriented to person, place, and time.     Comments: Intact to light touch in LLE and RLE/AKA Ox3-   Psychiatric:     Comments: Somewhat irritable about interrupting meal, however polite Appears very slightly anxious     Results for orders placed or performed during the hospital encounter of 03/05/23 (from the past 48 hour(s))  Hemoglobin and hematocrit, blood     Status: Abnormal   Collection Time: 03/11/23  3:35 PM  Result Value Ref Range   Hemoglobin 9.2 (L) 13.0 - 17.0 g/dL    Comment: REPEATED TO VERIFY   HCT 27.4 (L) 39.0 - 52.0 %    Comment: Performed at [facility], [address], [place], [place] [address]  Renal function panel     Status: Abnormal   Collection Time: 03/12/23  2:25 AM  Result Value Ref Range   Sodium 136 135 - 145 mmol/L   Potassium 4.6 3.5 - 5.1 mmol/L   Chloride 107 98 - 111 mmol/L   CO2 20 (L) 22 - 32 mmol/L   Glucose, Bld 114 (H) 70 - 99 mg/dL    Comment: Glucose reference range applies only to samples taken after fasting for at least 8 hours.   BUN 35 (H) 8 - 23 mg/dL   Creatinine, Ser 1.80 (H) 0.61 - 1.24 mg/dL   Calcium 9.0 8.9 - 10.3 mg/dL   Phosphorus 3.3 2.5 - 4.6 mg/dL   Albumin 1.9 (L) 3.5 - 5.0 g/dL   GFR, Estimated 39 (L) >60 mL/min    Comment: (NOTE) Calculated using the CKD-EPI Creatinine Equation (2021)    Anion gap 9 5 - 15    Comment: Performed at [facility] [address] [place], [place] [address]  Magnesium     Status: None   Collection Time: 03/12/23  2:25 AM  Result Value Ref Range   Magnesium 1.9 1.7 - 2.4 mg/dL    Comment: Performed at [facility] [address], [place], [place] [address]  CBC     Status: Abnormal    Collection Time: 03/12/23  2:25 AM  Result Value Ref Range   WBC 10.9 (H) 4.0 - 10.5 K/uL   RBC 3.12 (L) 4.22 - 5.81 MIL/uL   Hemoglobin 9.0 (L) 13.0 - 17.0 g/dL   HCT 27.8 (L) 39.0 - 52.0 %   MCV 89.1 80.0 - 100.0 fL   MCH 28.8 26.0 - 34.0 pg   MCHC 32.4 30.0 - 36.0 g/dL   RDW 16.0 (H) 11.5 - 15.5 %   Platelets 442 (H) 150 - 400 K/uL   nRBC 0.0 0.0 - 0.2 %    Comment: Performed at [facility] [address], [place], [place] [address]  Renal function panel     Status: Abnormal   Collection Time: 03/13/23  2:09 AM  Result Value Ref Range   Sodium 135 135 - 145 mmol/L   Potassium 4.5 3.5 - 5.1 mmol/L   Chloride 102 98 - 111 mmol/L   CO2 21 (L) 22 - 32 mmol/L   Glucose, Bld 115 (H) 70 - 99 mg/dL    Comment: Glucose reference range applies only to samples taken after fasting for at least 8 hours.   BUN 36 (H) 8 - 23 mg/dL   Creatinine, Ser 1.87 (H) 0.61 - 1.24 mg/dL   Calcium 8.9 8.9 - 10.3 mg/dL   Phosphorus 3.2 2.5 - 4.6 mg/dL   Albumin 1.9 (L) 3.5 - 5.0 g/dL   GFR, Estimated 37 (L) >60 mL/min    Comment: (NOTE) Calculated using the CKD-EPI Creatinine Equation (2021)    Anion gap 12 5 - 15    Comment: Performed at [facility] [address], [place], [place] [address]  Magnesium     Status: None   Collection Time: 03/13/23  2:09 AM  Result Value Ref Range   Magnesium 1.8 1.7 - 2.4 mg/dL    Comment: Performed at [facility] [address] [place], [place] [address]  CBC     Status: Abnormal   Collection Time: 03/13/23  2:09 AM  Result Value Ref Range   WBC 9.4 4.0 - 10.5 K/uL   RBC 3.03 (L) 4.22 - 5.81 MIL/uL   Hemoglobin 8.7 (L) 13.0 - 17.0 g/dL   HCT 27.1 (L) 39.0 - 52.0 %   MCV 89.4 80.0 - 100.0 fL   MCH 28.7 26.0 - 34.0 pg   MCHC 32.1 30.0 - 36.0 g/dL   RDW 15.8 (H) 11.5 - 15.5 %   Platelets 385 150 - 400 K/uL   nRBC 0.0 0.0 - 0.2 %    Comment: Performed at [facility], [place] [address], [place], [place] [address]  CK     Status:  None   Collection Time: 03/13/23  2:09 AM  Result Value Ref Range   Total CK 369 49 - 397 U/L    Comment: Performed at [facility], [place] [address] [place], [place] [address]   No results found.    Blood pressure (!) 129/95, pulse 100, temperature 98.4 F (36.9 C), temperature source Oral, resp. rate 19, height 6' (1.829 m), weight 100 kg, SpO2 92 %.  Medical Problem List and Plan: 1. Functional deficits secondary to R AKA due to thrombosis of graft and lack of flow per ABI's.   -patient may  shower- cover incision  -ELOS/Goals: ~ 2 weeks supervision at w/c level 2.  Antithrombotics: -DVT/anticoagulation:  Pharmaceutical: Lovenox  -antiplatelet therapy: ASA.  3. Pain Management: Oxycodone prn. D/c dilaudid as not used for  past 3 days  --add oxycontin for  more consistent relief. Flexeril prn for spasms. Pain has been uncontrolled, will attempt to get better control with long acting, and treating phantom pain  --reporting phantom pain also--may need to resume gabapentin.  4. Mood/Behavior/Sleep: LCSW to follow for evaluation and support.  -antipsychotic agents: N/A.  --Continue ativan prn for anxiety. Consulted Dr. Sima Matas to help with coping/anxiety/limb loss. Doesn't want to look at AKA; also lost long term GF since hospital admission- needs help coping/with grief  --Trazodone added for insomnia prn.  5. Neuropsych/cognition: This patient is capable of making decisions on his  own behalf. 6. Skin/Wound Care: Monitor wound for healing. Monitor for blistering.     7. Fluids/Electrolytes/Nutrition: Monitor I/O. Check CMET in am.   --Protein supplements added to promote wound healing.  8.  Leucocytosis/Fevers: WBC 18.4-->9.4/ due to RLE cellulitis now s/p AKA.   --Completed ceftriaxone X 5 days  --Resolving and will continue to monitor.  9. HTN: Monitor BP TID. Continue metoprolol --continue to hold prinzide due to AKI and as BP controlled.  10. Acute on chronic renal  failure: BUN/SCr trending down from 127/5.1-->38/1.87 --continue sodium bicarb as CO2  still elevated but improved 13-->21 11. Acute on chronic anemia: Improved post transfusion 3 total units  --add iron supplement. Will con't to monitor 13. Low protein stores: Add juven.  14. Abnormal LFTs: Continue to monitor. Recheck in am.  15. Elevated BS: Question stress/infection related. Will check Hgb A1c in am.  16. GERD: Continue PPI (was better with Nexium)     Bary Leriche, PA-C 03/13/2023  I have personally performed a face to face diagnostic evaluation of this patient and formulated the key components of the plan.  Additionally, I have personally reviewed laboratory data, imaging studies, as well as relevant notes and concur with the physician assistant's documentation above.   The patient's status has not changed from the original H&P.  Any changes in documentation from the acute care chart have been noted above.

## 2023-03-13 NOTE — Progress Notes (Signed)
Occupational Therapy Treatment Patient Details Name: [name] MRN: [id number] DOB: 03-20-1951 Today's Date: 03/13/2023   History of present illness 72 y.o. male presents to [facility] hospital on 03/05/2023 with RLE pain. Pt underwent RLE bypass on 12/12/22, thrombosis of graft on 02/14/23. Pt underwent R AKA on 3/15. PMH includes HTN, HLD, PAD, prostate CA, GERD.   OT comments  Pt progressing towards goals, still remains emotional regarding loss of limb, however is motivated to mobilize stating " I know I need to get up". Pt min-mod A for ADLs, min guard for bed mobility, and min-mod A for stand pivot transfer x2 to [facility] and then to chair. Pt with mild blood oozing from LLE incision, RN notified. Pt presenting with impairments listed below, will follow acutely. Continue to recommend AIR at d/c.   Recommendations for follow up therapy are one component of a multi-disciplinary discharge planning process, led by the attending physician.  Recommendations may be updated based on patient status, additional functional criteria and insurance authorization.    Follow Up Recommendations  Acute inpatient rehab (3hours/day)     Assistance Recommended at Discharge Frequent or constant Supervision/Assistance  Patient can return home with the following  A lot of help with walking and/or transfers;A lot of help with bathing/dressing/bathroom;Assistance with cooking/housework;Direct supervision/assist for medications management;Direct supervision/assist for financial management;Assist for transportation;Help with stairs or ramp for entrance   Equipment Recommendations  Tub/shower bench;Wheelchair (measurements OT);Wheelchair cushion (measurements OT)    Recommendations for Other Services PT consult;Rehab consult    Precautions / Restrictions Precautions Precautions: Fall Restrictions Weight Bearing Restrictions: Yes RLE Weight Bearing: Non weight bearing       Mobility Bed Mobility Overal bed  mobility: Needs Assistance Bed Mobility: Supine to Sit     Supine to sit: Min guard          Transfers Overall transfer level: Needs assistance Equipment used: Rolling walker (2 wheels) Transfers: Sit to/from Stand, Bed to chair/wheelchair/BSC Sit to Stand: Min assist, Mod assist Stand pivot transfers: Mod assist, Min assist         General transfer comment: mod A from lower surfaces, heel-toe pattern to get from bed to [facility], able to hop to transfer BSC to chair     Balance Overall balance assessment: Needs assistance Sitting-balance support: No upper extremity supported, Feet supported Sitting balance-Leahy Scale: Good     Standing balance support: Bilateral upper extremity supported, Reliant on assistive device for balance Standing balance-Leahy Scale: Poor                             ADL either performed or assessed with clinical judgement   ADL Overall ADL's : Needs assistance/impaired                 Upper Body Dressing : Minimal assistance;Sitting Upper Body Dressing Details (indicate cue type and reason): changing into clean gown Lower Body Dressing: Moderate assistance Lower Body Dressing Details (indicate cue type and reason): donning sock on RLE Toilet Transfer: Moderate assistance;Stand-pivot;BSC/3in1;Rolling walker (2 wheels) Toilet Transfer Details (indicate cue type and reason): x2 to [facility] and to chair         Functional mobility during ADLs: Moderate assistance;Minimal assistance;Rolling walker (2 wheels)      Extremity/Trunk Assessment Upper Extremity Assessment Upper Extremity Assessment: Overall WFL for tasks assessed   Lower Extremity Assessment Lower Extremity Assessment: Defer to PT evaluation        Vision  Vision Assessment?: No apparent visual deficits   Perception Perception Perception: Not tested   Praxis Praxis Praxis: Not tested    Cognition Arousal/Alertness: Awake/alert Behavior During Therapy: WFL  for tasks assessed/performed Overall Cognitive Status: Within Functional Limits for tasks assessed                                 General Comments: at times tearful/emotional in regards to recent loss of limb        Exercises      Shoulder Instructions       General Comments VSS on RA    Pertinent Vitals/ Pain       Pain Assessment Pain Assessment: Faces Pain Score: 4  Faces Pain Scale: Hurts little more Pain Location: RLE Pain Descriptors / Indicators: Sore Pain Intervention(s): Limited activity within patient's tolerance, Monitored during session, Repositioned  Home Living                                          Prior Functioning/Environment              Frequency  Min 2X/week        Progress Toward Goals  OT Goals(current goals can now be found in the care plan section)  Progress towards OT goals: Progressing toward goals  Acute Rehab OT Goals Patient Stated Goal: none stated OT Goal Formulation: With patient Time For Goal Achievement: 03/24/23 Potential to Achieve Goals: Good ADL Goals Pt Will Perform Lower Body Dressing: with supervision;with adaptive equipment;sitting/lateral leans;bed level Pt Will Transfer to Toilet: stand pivot transfer;squat pivot transfer;with min guard assist;bedside commode Pt Will Perform Tub/Shower Transfer: Tub transfer;Shower transfer;tub bench;with min assist;Squat pivot transfer;Stand pivot transfer;rolling walker  Plan Discharge plan remains appropriate;Frequency remains appropriate    Co-evaluation                 AM-PAC OT "6 Clicks" Daily Activity     Outcome Measure   Help from another person eating meals?: None Help from another person taking care of personal grooming?: A Little Help from another person toileting, which includes using toliet, bedpan, or urinal?: A Lot Help from another person bathing (including washing, rinsing, drying)?: A Lot Help from another  person to put on and taking off regular upper body clothing?: A Little Help from another person to put on and taking off regular lower body clothing?: A Lot 6 Click Score: 16    End of Session Equipment Utilized During Treatment: Gait belt;Rolling walker (2 wheels)  OT Visit Diagnosis: Unsteadiness on feet (R26.81);Other abnormalities of gait and mobility (R26.89);Muscle weakness (generalized) (M62.81)   Activity Tolerance Patient tolerated treatment well   Patient Left in chair;with call bell/phone within reach;with chair alarm set;with family/visitor present   Nurse Communication Mobility status (notified RN of pt's L LE incision oozing blood)        Time: [id number] OT Time Calculation (min): 40 min  Charges: OT General Charges $OT Visit: 1 Visit OT Treatments $Self Care/Home Management : 38-52 mins  Renaye Rakers, OTD, OTR/L SecureChat Preferred Acute Rehab [phone] - [place] 03/13/2023, 12:19 PM

## 2023-03-13 NOTE — H&P (Signed)
Physical Medicine and Rehabilitation Admission H&P     CC: Functional deficits due to R-AKA     HPI: [name] is a 72 year old male with history of HTN, prostate CA, GERD, CKD, PAD with right ankle ulcer s/p Fem-tib bypass for critical limb ischemia who had failure of graft with post op edema, significant pain and extensive wounds. He was admitted on 03/05/23 for R-AKA by Dr. Donzetta Matters. Limb not felt to be salvageable and he was admitted on 03/11 for attempt at angiography which showed chronically thrombosed anterior/posterior tibial arteries. Post procedure with drop in Hgb to 5.9 and was transfused with 2 units PRBC. He continued to have difficulty processing limb loss with multiple conversations. He developed acute on chronic renal failure 03/12 and was found to have moderate right and mild left hydronephrosis therefore foley placed.  On 03/14, he developed fever- 101F,  tachycardia, leucocytosis with WBC- 18.4 and noted to have elevated [id number] (question rhabdomyolysis).  He was started on IV ceftriaxone for RLE cellulitis and underwent R-AKA on 03/15. ABLA with hgb drop to 7.4 treated with one unit PRBC on 03/17 and completes antibiotic course today. As AKI resolving, foley was discontinued and passed voiding trial. PT/OT has been working with patient who continues to be limited by pain, fear, anxiety and weakness.  He has had decline in functional status and CIR recommended by rehab team.    Prior to admission, patient lived alone and was independent and working prior to 11/2022. Has friends and church family who can assist after discharge.    Pt reports pain is 8-10/10- and extremely painful- he actually says pain >10/10- however explained 10/10 is the max.  He notes that meds help improve things down to 5-7/10 at best, but then it comes back when pain meds wear off.  Having a lot of phantom pain and sensation- can feel foot and it's painful.  LBM today- depends on intake.  No  SOB except when he gets anxious, and then that's doing better.  He does NOT want to look at amputation and it REALLY bothers him.        Review of Systems  Constitutional:  Negative for chills and fever.  HENT: Negative.    Respiratory:  Negative for cough and shortness of breath.   Cardiovascular:  Negative for chest pain, orthopnea and claudication.  Gastrointestinal:  Positive for heartburn. Negative for constipation.  Genitourinary:  Negative for dysuria and frequency.  Musculoskeletal:  Positive for joint pain. Negative for myalgias.  Skin:  Negative for rash.  Neurological:  Positive for sensory change and weakness. Negative for dizziness.  Endo/Heme/Allergies: Negative.   Psychiatric/Behavioral:  Negative for suicidal ideas. The patient is nervous/anxious. The patient does not have insomnia.           Past Medical History:  Diagnosis Date   Family history of metabolic acidosis with increased anion gap 03/05/2023   GERD (gastroesophageal reflux disease)     History of kidney stones     Hyperlipidemia     Hypertension     Prostate cancer [facility]) 2011    radiation june 2020   Umbilical hernia     Wears dentures      full   Wears glasses     Wears partial dentures      lower           Past Surgical History:  Procedure Laterality Date   ABDOMINAL AORTOGRAM W/LOWER EXTREMITY N/A 11/20/2022  Procedure: ABDOMINAL AORTOGRAM W/LOWER EXTREMITY;  Surgeon: Waynetta Sandy, MD;  Location: [place] [facility];  Service: Cardiovascular;  Laterality: N/A;   ABDOMINAL AORTOGRAM W/LOWER EXTREMITY N/A 03/05/2023    Procedure: ABDOMINAL AORTOGRAM W/LOWER EXTREMITY;  Surgeon: Waynetta Sandy, MD;  Location: [place] [facility];  Service: Cardiovascular;  Laterality: N/A;   AMPUTATION Right 03/09/2023    Procedure: AMPUTATION ABOVE KNEE;  Surgeon: Waynetta Sandy, MD;  Location: [place];  Service: Vascular;  Laterality: Right;   COLONOSCOPY N/A 08/25/2020     Procedure: COLONOSCOPY;  Surgeon: Daneil Dolin, MD;  [facility];  Service: Endoscopy;  Laterality: N/A;  9:30   COLONOSCOPY       CYSTOSCOPY WITH LITHOLAPAXY N/A 05/18/2021    Procedure: CYSTOSCOPY WITH LITHOLAPAXY;  Surgeon: Ceasar Mons, MD;  Location: [facility];  Service: Urology;  Laterality: N/A;  ONLY NEEDS  30 MIN   FEMORAL-TIBIAL BYPASS GRAFT Right 12/12/2022    Procedure: RIGHT COMMON FEMORAL-POSTERIOR TIBIAL ARTERY BYPASS WITH VEIN HARVESTING OF THE GREATER SAPHENOUS VEIN AND FEMORAL ENDARTERECTOMY WITH COMPOSITE GRAFT;  Surgeon: Waynetta Sandy, MD;  Location: [place];  Service: Vascular;  Laterality: Right;   POLYPECTOMY   08/25/2020    Procedure: POLYPECTOMY;  Surgeon: Daneil Dolin, MD;  [facility];  Service: Endoscopy;;   ROBOT ASSISTED LAPAROSCOPIC RADICAL PROSTATECTOMY   2011   TONSILLECTOMY   age 33    and adenoids removed           Family History  Problem Relation Age of Onset   Gastric cancer Mother     Prostate cancer Father     Breast cancer Neg Hx     Pancreatic cancer Neg Hx        Social History:  Lives alone. He was independent PTA. He  reports that he quit smoking about 10/2022.  His smoking use included cigarettes. He has a 5.25 pack-year smoking history. He has never used smokeless tobacco. He reports that he does not drink alcohol and does not use drugs.         Allergies  Allergen Reactions   Shellfish Allergy Swelling               Facility-Administered Medications Prior to Admission  Medication Dose Route Frequency Provider Last Rate Last Admin   betamethasone acetate-betamethasone sodium phosphate (CELESTONE) injection 3 mg  3 mg Intra-articular Once Daylene Katayama M, DPM       betamethasone acetate-betamethasone sodium phosphate (CELESTONE) injection 3 mg  3 mg Intra-articular Once Edrick Kins, DPM              Medications Prior to Admission  Medication Sig Dispense Refill    acetaminophen-codeine (TYLENOL #3) 300-30 MG tablet Take 1 tablet by mouth 2 (two) times daily.       aspirin EC 81 MG tablet Take 81 mg by mouth daily.       atorvastatin (LIPITOR) 40 MG tablet Take 1 tablet (40 mg total) by mouth every morning. 30 tablet 3   esomeprazole (NEXIUM) 20 MG capsule Take 20 mg by mouth daily.       furosemide (LASIX) 20 MG tablet Take 20 mg by mouth daily.       lisinopril-hydrochlorothiazide (PRINZIDE,ZESTORETIC) 10-12.5 MG tablet Take 1 tablet by mouth daily.       meloxicam (MOBIC) 15 MG tablet Take 1 tablet by mouth once daily 30 tablet 0   metoprolol tartrate (LOPRESSOR)  25 MG tablet Take 1/2 (one-half) tablet by mouth twice daily 30 tablet 0   Multiple Vitamins-Minerals (CENTRUM SILVER PO) Take 1 tablet by mouth daily.        oxybutynin (DITROPAN-XL) 10 MG 24 hr tablet Take 10 mg by mouth daily.       potassium chloride SA (KLOR-CON M) 20 MEQ tablet Take 10 mEq by mouth daily.       gabapentin (NEURONTIN) 100 MG capsule Take 1 capsule (100 mg total) by mouth 3 (three) times daily. (Patient not taking: Reported on 02/28/2023) 90 capsule 3   SPIKEVAX syringe                Home: Home Living Family/patient expects to be discharged to:: Private residence Living Arrangements: Alone Available Help at Discharge: Family, Friend(s), Available PRN/intermittently (church family) Type of Home: House Home Access: Level entry, Stairs to enter [employer] of Steps: 2 Entrance Stairs-Rails: Right, Left Home Layout: One level Bathroom Shower/Tub: Walk-in shower, Tub/shower unit (walk in shower is in bedroom, tub shower in hall bath) Bathroom Toilet: Standard (has high commode in hall bathroom) Home Equipment: BSC/3in1, Conservation officer, nature (2 wheels), [employer] - single point (family is working on getting pt a hospital bed) Additional Comments: sister is Therapist, sports, is unsure how much help he will have initially at d/c.  Lives With: Alone   Functional History: Prior  Function Prior Level of Function : Independent/Modified Independent, Driving Mobility Comments: reports intermittent use of cane ADLs Comments: ind   Functional Status:  Mobility: Bed Mobility Overal bed mobility: Needs Assistance Bed Mobility: Supine to Sit Supine to sit: Min guard Transfers Overall transfer level: Needs assistance Equipment used: Rolling walker (2 wheels) Transfers: Sit to/from Stand, Bed to chair/wheelchair/BSC Sit to Stand: Min assist, Mod assist Bed to/from chair/wheelchair/BSC transfer type:: Stand pivot Stand pivot transfers: Mod assist, Min assist General transfer comment: mod A from lower surfaces, heel-toe pattern to get from bed to [facility], able to hop to transfer BSC to chair Ambulation/Gait Ambulation/Gait assistance: Min assist Gait Distance (Feet): 3 Feet Assistive device: Rolling walker (2 wheels) Gait Pattern/deviations:  (hop-to gait) General Gait Details: hop-to gait with reduced L foot clearance Gait velocity: reuced Gait velocity interpretation: <1.31 ft/sec, indicative of household ambulator   ADL: ADL Overall ADL's : Needs assistance/impaired Eating/Feeding: Set up Grooming: Set up, Sitting Upper Body Bathing: Min guard, Sitting Lower Body Bathing: Moderate assistance, Sitting/lateral leans Upper Body Dressing : Minimal assistance, Sitting Upper Body Dressing Details (indicate cue type and reason): changing into clean gown Lower Body Dressing: Moderate assistance Lower Body Dressing Details (indicate cue type and reason): donning sock on RLE Toilet Transfer: Moderate assistance, Stand-pivot, BSC/3in1, Rolling walker (2 wheels) Toilet Transfer Details (indicate cue type and reason): x2 to [facility] and to chair Toileting- Clothing Manipulation and Hygiene: Maximal assistance Functional mobility during ADLs: Moderate assistance, Minimal assistance, Rolling walker (2 wheels)   Cognition: Cognition Overall Cognitive Status: Within Functional  Limits for tasks assessed Orientation Level: Oriented X4 Cognition Arousal/Alertness: Awake/alert Behavior During Therapy: WFL for tasks assessed/performed Overall Cognitive Status: Within Functional Limits for tasks assessed General Comments: at times tearful/emotional in regards to recent loss of limb     Blood pressure (!) 129/95, pulse 100, temperature 98.4 F (36.9 C), temperature source Oral, resp. rate 19, height 6' (1.829 m), weight 100 kg, SpO2 92 %. Physical Exam Vitals and nursing note reviewed. Exam conducted with a chaperone present.  Constitutional:      Appearance: Normal appearance.  Comments: Older gentleman sitting on bedside chair- working on his lunch, family member at bedside; irritated to be interrupted, NAD; awake, alert  HENT:     Head: Normocephalic and atraumatic.     Right Ear: External ear normal.     Left Ear: External ear normal.     Nose: Nose normal. No congestion.     Mouth/Throat:     Mouth: Mucous membranes are moist.     Pharynx: Oropharynx is clear. No oropharyngeal exudate.  Eyes:     General:        Right eye: No discharge.        Left eye: No discharge.     Extraocular Movements: Extraocular movements intact.  Cardiovascular:     Rate and Rhythm: Normal rate and regular rhythm.     Heart sounds: Normal heart sounds. No murmur heard.    No gallop.  Pulmonary:     Effort: Pulmonary effort is normal. No respiratory distress.     Breath sounds: Normal breath sounds. No wheezing, rhonchi or rales.  Abdominal:     General: Abdomen is flat. Bowel sounds are normal. There is no distension.     Palpations: Abdomen is soft.     Tenderness: There is no abdominal tenderness.  Genitourinary:    Penis: Normal.      Testes: Normal.  Musculoskeletal:     Cervical back: Neck supple. No tenderness.     Comments: R-AKA with multiple ecchymotic areas--question early blister as well as blister? inner thigh covered with foam dressing. However except  ecchymoses, staples intact, doesn't have more than moderate swelling and very TTP as expected UE strength 5-/5 throughout B/L LLE- 5-/5 in HF, KE, KF, DF and PF RLE- NT per pt request    Skin:    General: Skin is warm and dry.     Comments: IV R forearm looks OK Inner groin/thigh dressing from prior bypass R AKA incision per the MS  Neurological:     Mental Status: He is alert and oriented to person, place, and time.     Comments: Intact to light touch in LLE and RLE/AKA Ox3-   Psychiatric:     Comments: Somewhat irritable about interrupting meal, however polite Appears very slightly anxious        Lab Results Last 48 Hours        Results for orders placed or performed during the hospital encounter of 03/05/23 (from the past 48 hour(s))  Hemoglobin and hematocrit, blood     Status: Abnormal    Collection Time: 03/11/23  3:35 PM  Result Value Ref Range    Hemoglobin 9.2 (L) 13.0 - 17.0 g/dL      Comment: REPEATED TO VERIFY    HCT 27.4 (L) 39.0 - 52.0 %      Comment: Performed at [facility], [address] [place], [place] [address]  Renal function panel     Status: Abnormal    Collection Time: 03/12/23  2:25 AM  Result Value Ref Range    Sodium 136 135 - 145 mmol/L    Potassium 4.6 3.5 - 5.1 mmol/L    Chloride 107 98 - 111 mmol/L    CO2 20 (L) 22 - 32 mmol/L    Glucose, Bld 114 (H) 70 - 99 mg/dL      Comment: Glucose reference range applies only to samples taken after fasting for at least 8 hours.    BUN 35 (H) 8 - 23 mg/dL  Creatinine, Ser 1.80 (H) 0.61 - 1.24 mg/dL    Calcium 9.0 8.9 - 10.3 mg/dL    Phosphorus 3.3 2.5 - 4.6 mg/dL    Albumin 1.9 (L) 3.5 - 5.0 g/dL    GFR, Estimated 39 (L) >60 mL/min      Comment: (NOTE) Calculated using the CKD-EPI Creatinine Equation (2021)      Anion gap 9 5 - 15      Comment: Performed at [facility] [address] [place], [place] [address]  Magnesium     Status: None    Collection Time: 03/12/23  2:25 AM   Result Value Ref Range    Magnesium 1.9 1.7 - 2.4 mg/dL      Comment: Performed at [facility] [address], [place], [place] [address]  CBC     Status: Abnormal    Collection Time: 03/12/23  2:25 AM  Result Value Ref Range    WBC 10.9 (H) 4.0 - 10.5 K/uL    RBC 3.12 (L) 4.22 - 5.81 MIL/uL    Hemoglobin 9.0 (L) 13.0 - 17.0 g/dL    HCT 27.8 (L) 39.0 - 52.0 %    MCV 89.1 80.0 - 100.0 fL    MCH 28.8 26.0 - 34.0 pg    MCHC 32.4 30.0 - 36.0 g/dL    RDW 16.0 (H) 11.5 - 15.5 %    Platelets 442 (H) 150 - 400 K/uL    nRBC 0.0 0.0 - 0.2 %      Comment: Performed at [facility] [address] [place], [place] [address]  Renal function panel     Status: Abnormal    Collection Time: 03/13/23  2:09 AM  Result Value Ref Range    Sodium 135 135 - 145 mmol/L    Potassium 4.5 3.5 - 5.1 mmol/L    Chloride 102 98 - 111 mmol/L    CO2 21 (L) 22 - 32 mmol/L    Glucose, Bld 115 (H) 70 - 99 mg/dL      Comment: Glucose reference range applies only to samples taken after fasting for at least 8 hours.    BUN 36 (H) 8 - 23 mg/dL    Creatinine, Ser 1.87 (H) 0.61 - 1.24 mg/dL    Calcium 8.9 8.9 - 10.3 mg/dL    Phosphorus 3.2 2.5 - 4.6 mg/dL    Albumin 1.9 (L) 3.5 - 5.0 g/dL    GFR, Estimated 37 (L) >60 mL/min      Comment: (NOTE) Calculated using the CKD-EPI Creatinine Equation (2021)      Anion gap 12 5 - 15      Comment: Performed at [facility] [address] [place], [place] [address]  Magnesium     Status: None    Collection Time: 03/13/23  2:09 AM  Result Value Ref Range    Magnesium 1.8 1.7 - 2.4 mg/dL      Comment: Performed at [facility] [address] [place], [place] [address]  CBC     Status: Abnormal    Collection Time: 03/13/23  2:09 AM  Result Value Ref Range    WBC 9.4 4.0 - 10.5 K/uL    RBC 3.03 (L) 4.22 - 5.81 MIL/uL    Hemoglobin 8.7 (L) 13.0 - 17.0 g/dL    HCT 27.1 (L) 39.0 - 52.0 %    MCV 89.4 80.0 - 100.0 fL    MCH 28.7 26.0 - 34.0  pg     MCHC 32.1 30.0 - 36.0 g/dL    RDW 15.8 (H) 11.5 - 15.5 %    Platelets 385 150 - 400 K/uL    nRBC 0.0 0.0 - 0.2 %      Comment: Performed at [facility], [place] [address], [place], [place] [address]  CK     Status: None    Collection Time: 03/13/23  2:09 AM  Result Value Ref Range    Total CK 369 49 - 397 U/L      Comment: Performed at [facility], [place] [address] [place], [place] [address]      Imaging Results (Last 48 hours)  No results found.         Blood pressure (!) 129/95, pulse 100, temperature 98.4 F (36.9 C), temperature source Oral, resp. rate 19, height 6' (1.829 m), weight 100 kg, SpO2 92 %.   Medical Problem List and Plan: 1. Functional deficits secondary to R AKA due to thrombosis of graft and lack of flow per ABI's.              -patient may  shower- cover incision             -ELOS/Goals: ~ 2 weeks supervision at w/c level 2.  Antithrombotics: -DVT/anticoagulation:  Pharmaceutical: Lovenox             -antiplatelet therapy: ASA.  3. Pain Management: Oxycodone prn. D/c dilaudid as not used for past 3 days             --add oxycontin for  more consistent relief. Flexeril prn for spasms. Pain has been uncontrolled, will attempt to get better control with long acting, and treating phantom pain             --reporting phantom pain also--may need to resume gabapentin.  4. Mood/Behavior/Sleep: LCSW to follow for evaluation and support.             -antipsychotic agents: N/A.  --Continue ativan prn for anxiety. Consulted Dr. Sima Matas to help with coping/anxiety/limb loss. Doesn't want to look at AKA; also lost long term GF since hospital admission- needs help coping/with grief             --Trazodone added for insomnia prn.  5. Neuropsych/cognition: This patient is capable of making decisions on his  own behalf. 6. Skin/Wound Care: Monitor wound for healing. Monitor for blistering.      7. Fluids/Electrolytes/Nutrition: Monitor I/O. Check CMET in am.               --Protein supplements added to promote wound healing.  8.  Leucocytosis/Fevers: WBC 18.4-->9.4/ due to RLE cellulitis now s/p AKA.              --Completed ceftriaxone X 5 days  --Resolving and will continue to monitor.  9. HTN: Monitor BP TID. Continue metoprolol --continue to hold prinzide due to AKI and as BP controlled.  10. Acute on chronic renal failure: BUN/SCr trending down from 127/5.1-->38/1.87 --continue sodium bicarb as CO2  still elevated but improved 13-->21 11. Acute on chronic anemia: Improved post transfusion 3 total units             --add iron supplement. Will con't to monitor 13. Low protein stores: Add juven.  14. Abnormal LFTs: Continue to monitor. Recheck in am.  15. Elevated BS: Question stress/infection related. Will check Hgb A1c in am.  16. GERD: Continue PPI (was better with Nexium)  Bary Leriche, PA-C 03/13/2023   I have personally performed a face to face diagnostic evaluation of this patient and formulated the key components of the plan.  Additionally, I have personally reviewed laboratory data, imaging studies, as well as relevant notes and concur with the physician assistant's documentation above.   The patient's status has not changed from the original H&P.  Any changes in documentation from the acute care chart have been noted above.

## 2023-03-13 NOTE — Progress Notes (Signed)
Physical Therapy Treatment Patient Details Name: [name] MRN: [id number] DOB: 09/23/1951 Today's Date: 03/13/2023   History of Present Illness 72 y.o. male presents to [facility] hospital on 03/05/2023 with RLE pain. Pt underwent RLE bypass on 12/12/22, thrombosis of graft on 02/14/23. Pt underwent R AKA on 3/15. PMH includes HTN, HLD, PAD, prostate CA, GERD.    PT Comments    Pt was seen for mobility on side of chair and to practice standing skills, and noted his struggle with being able to progress with his pain, fears and his recent loss of significant other.  He is still trying to be ready for CIR admission, working on sequence to power up to stand along with managing pain issues.  Follow pt for progression with all his mobility and safety of transfers and gait as his stay permits.  CIR is expected to admit him today.   Recommendations for follow up therapy are one component of a multi-disciplinary discharge planning process, led by the attending physician.  Recommendations may be updated based on patient status, additional functional criteria and insurance authorization.  Follow Up Recommendations  Acute inpatient rehab (3hours/day)     Assistance Recommended at Discharge Intermittent Supervision/Assistance  Patient can return home with the following A lot of help with walking and/or transfers;A little help with bathing/dressing/bathroom;Assistance with cooking/housework;Assist for transportation;Help with stairs or ramp for entrance   Equipment Recommendations  Wheelchair (measurements PT)    Recommendations for Other Services Rehab consult     Precautions / Restrictions Precautions Precautions: Fall Precaution Comments: phantom limb pain RLE Restrictions Weight Bearing Restrictions: Yes RLE Weight Bearing: Non weight bearing     Mobility  Bed Mobility               General bed mobility comments: up in chair    Transfers                   General  transfer comment: declined to get up from chair    Ambulation/Gait                   Stairs             Wheelchair Mobility    Modified Rankin (Stroke Patients Only)       Balance Overall balance assessment: Needs assistance Sitting-balance support: Feet supported Sitting balance-Leahy Scale: Good                                      Cognition Arousal/Alertness: Awake/alert Behavior During Therapy: WFL for tasks assessed/performed Overall Cognitive Status: Within Functional Limits for tasks assessed                                 General Comments: has good support with comments from his sister        Exercises General Exercises - Lower Extremity Ankle Circles/Pumps: AROM, Left Quad Sets: AROM, Left Gluteal Sets: AROM, Both Hip ABduction/ADduction: AROM, Both Hip Flexion/Marching: AROM, Both    General Comments General comments (skin integrity, edema, etc.): pt was assisted to chair by OT and then he was too tired and worried about pain to get up again, but did do chair push ups for PT      Pertinent Vitals/Pain Pain Assessment Pain Assessment: Faces Faces Pain Scale: Hurts even more Pain Location: RLE Pain  Descriptors / Indicators: Guarding, Aching Pain Intervention(s): Repositioned, Premedicated before session, Monitored during session, Limited activity within patient's tolerance    Home Living                          Prior Function            PT Goals (current goals can now be found in the care plan section)      Frequency    Min 3X/week      PT Plan Current plan remains appropriate    Co-evaluation              AM-PAC PT "6 Clicks" Mobility   Outcome Measure  Help needed turning from your back to your side while in a flat bed without using bedrails?: A Little Help needed moving from lying on your back to sitting on the side of a flat bed without using bedrails?: A Little Help  needed moving to and from a bed to a chair (including a wheelchair)?: A Little Help needed standing up from a chair using your arms (e.g., wheelchair or bedside chair)?: A Little Help needed to walk in hospital room?: A Lot Help needed climbing 3-5 steps with a railing? : Total 6 Click Score: 15    End of Session   Activity Tolerance: Patient tolerated treatment well Patient left: in chair;with call bell/phone within reach;with chair alarm set Nurse Communication: Mobility status PT Visit Diagnosis: Other abnormalities of gait and mobility (R26.89)     Time: [id number] PT Time Calculation (min) (ACUTE ONLY): 36 min  Charges:  $Therapeutic Exercise: 8-22 mins $Therapeutic Activity: 8-22 mins    Ramond Dial 03/13/2023, 2:35 PM  Mee Hives, PT PhD Acute Rehab Dept. Number: [facility] [place]

## 2023-03-14 ENCOUNTER — Other Ambulatory Visit: Payer: Self-pay

## 2023-03-14 DIAGNOSIS — F4323 Adjustment disorder with mixed anxiety and depressed mood: Secondary | ICD-10-CM

## 2023-03-14 LAB — COMPREHENSIVE METABOLIC PANEL
ALT: 70 U/L — ABNORMAL HIGH (ref 0–44)
AST: 33 U/L (ref 15–41)
Albumin: 2 g/dL — ABNORMAL LOW (ref 3.5–5.0)
Alkaline Phosphatase: 96 U/L (ref 38–126)
Anion gap: 10 (ref 5–15)
BUN: 41 mg/dL — ABNORMAL HIGH (ref 8–23)
CO2: 22 mmol/L (ref 22–32)
Calcium: 9.1 mg/dL (ref 8.9–10.3)
Chloride: 104 mmol/L (ref 98–111)
Creatinine, Ser: 1.88 mg/dL — ABNORMAL HIGH (ref 0.61–1.24)
GFR, Estimated: 37 mL/min — ABNORMAL LOW (ref >60)
Glucose, Bld: 111 mg/dL — ABNORMAL HIGH (ref 70–99)
Potassium: 4.6 mmol/L (ref 3.5–5.1)
Sodium: 136 mmol/L (ref 135–145)
Total Bilirubin: 0.2 mg/dL — ABNORMAL LOW (ref 0.3–1.2)
Total Protein: 6 g/dL — ABNORMAL LOW (ref 6.5–8.1)

## 2023-03-14 LAB — CBC WITH DIFFERENTIAL/PLATELET
Abs Immature Granulocytes: 0.07 10*3/uL (ref 0.00–0.07)
Basophils Absolute: 0 10*3/uL (ref 0.0–0.1)
Basophils Relative: 0 %
Eosinophils Absolute: 0.3 10*3/uL (ref 0.0–0.5)
Eosinophils Relative: 4 %
HCT: 29.1 % — ABNORMAL LOW (ref 39.0–52.0)
Hemoglobin: 9.1 g/dL — ABNORMAL LOW (ref 13.0–17.0)
Immature Granulocytes: 1 %
Lymphocytes Relative: 14 %
Lymphs Abs: 1.3 10*3/uL (ref 0.7–4.0)
MCH: 29 pg (ref 26.0–34.0)
MCHC: 31.3 g/dL (ref 30.0–36.0)
MCV: 92.7 fL (ref 80.0–100.0)
Monocytes Absolute: 0.8 10*3/uL (ref 0.1–1.0)
Monocytes Relative: 8 %
Neutro Abs: 6.7 10*3/uL (ref 1.7–7.7)
Neutrophils Relative %: 73 %
Platelets: 414 10*3/uL — ABNORMAL HIGH (ref 150–400)
RBC: 3.14 MIL/uL — ABNORMAL LOW (ref 4.22–5.81)
RDW: 15.7 % — ABNORMAL HIGH (ref 11.5–15.5)
WBC: 9.2 10*3/uL (ref 4.0–10.5)
nRBC: 0 % (ref 0.0–0.2)

## 2023-03-14 LAB — CULTURE, BLOOD (ROUTINE X 2)
Culture: NO GROWTH
Culture: NO GROWTH
Special Requests: ADEQUATE
Special Requests: ADEQUATE

## 2023-03-14 LAB — HEMOGLOBIN A1C
Hgb A1c MFr Bld: 5.8 % — ABNORMAL HIGH (ref 4.8–5.6)
Mean Plasma Glucose: 120 mg/dL

## 2023-03-14 LAB — SURGICAL PATHOLOGY

## 2023-03-14 MED ORDER — GABAPENTIN 300 MG PO CAPS
300.0000 mg | ORAL_CAPSULE | Freq: Every day | ORAL | Status: DC
  Administered 2023-03-14 – 2023-03-25 (×12): 300 mg via ORAL
  Filled 2023-03-14 (×12): qty 1

## 2023-03-14 NOTE — Progress Notes (Signed)
Inpatient Rehabilitation  Patient information reviewed and entered into eRehab system by Yadir Zentner Khaliq Turay, OTR/L, Rehab Quality Coordinator.   Information including medical coding, functional ability and quality indicators will be reviewed and updated through discharge.   

## 2023-03-14 NOTE — Progress Notes (Signed)
Expand All Collapse All           Physical Medicine and Rehabilitation Consult Reason for Consult:Impaired functional mobility d/t PAD/right AKA Referring Physician: Cyndia Skeeters     HPI: [name] is a 72 y.o. male with a history of PAD and a chronic lower extremity wound status post multiple revascularization procedures, prostate cancer, chronic kidney disease who had further progression of his vascular disease which ultimately required a right above-knee amputation on March 09, 2023 by Dr. Donzetta Matters. Hospital course complicated by AKI on CKD d/t hydronephrosis. Pt is voiding. IV ceftriaxone completed for RLE cellulitis. Pt has had persistent anemia which is felt to be acute on chronic. LFT's still sl elevated, ?d/t rhabdomyolysis. Pt has been working with therapy and as of 3/16 was mod assist for transfers with RW and min to mod assist with BADL's.      Review of Systems  Constitutional:  Negative for fever.  HENT:  Negative for hearing loss.   Eyes:  Negative for blurred vision.  Respiratory:  Negative for cough.   Cardiovascular: Negative.   Gastrointestinal:  Negative for nausea and vomiting.  Genitourinary:  Negative for dysuria.  Musculoskeletal:  Positive for myalgias.  Skin:  Negative for rash.  Neurological:  Positive for weakness.  Psychiatric/Behavioral:  Positive for depression.        Denies depression but struggling somewhat with loss of his leg        Past Medical History:  Diagnosis Date   Family history of metabolic acidosis with increased anion gap 03/05/2023   GERD (gastroesophageal reflux disease)     History of kidney stones     Hyperlipidemia     Hypertension     Prostate cancer [facility]) 2011    radiation june 2020   Umbilical hernia     Wears dentures      full   Wears glasses     Wears partial dentures      lower         Past Surgical History:  Procedure Laterality Date   ABDOMINAL AORTOGRAM W/LOWER EXTREMITY N/A 11/20/2022    Procedure:  ABDOMINAL AORTOGRAM W/LOWER EXTREMITY;  Surgeon: Waynetta Sandy, MD;  Location: [place] [facility];  Service: Cardiovascular;  Laterality: N/A;   ABDOMINAL AORTOGRAM W/LOWER EXTREMITY N/A 03/05/2023    Procedure: ABDOMINAL AORTOGRAM W/LOWER EXTREMITY;  Surgeon: Waynetta Sandy, MD;  Location: [place] [facility];  Service: Cardiovascular;  Laterality: N/A;   AMPUTATION Right 03/09/2023    Procedure: AMPUTATION ABOVE KNEE;  Surgeon: Waynetta Sandy, MD;  Location: [place];  Service: Vascular;  Laterality: Right;   COLONOSCOPY N/A 08/25/2020    Procedure: COLONOSCOPY;  Surgeon: Daneil Dolin, MD;  [facility];  Service: Endoscopy;  Laterality: N/A;  9:30   COLONOSCOPY       CYSTOSCOPY WITH LITHOLAPAXY N/A 05/18/2021    Procedure: CYSTOSCOPY WITH LITHOLAPAXY;  Surgeon: Ceasar Mons, MD;  Location: [facility];  Service: Urology;  Laterality: N/A;  ONLY NEEDS  30 MIN   FEMORAL-TIBIAL BYPASS GRAFT Right 12/12/2022    Procedure: RIGHT COMMON FEMORAL-POSTERIOR TIBIAL ARTERY BYPASS WITH VEIN HARVESTING OF THE GREATER SAPHENOUS VEIN AND FEMORAL ENDARTERECTOMY WITH COMPOSITE GRAFT;  Surgeon: Waynetta Sandy, MD;  Location: [place];  Service: Vascular;  Laterality: Right;   POLYPECTOMY   08/25/2020    Procedure: POLYPECTOMY;  Surgeon: Daneil Dolin, MD;  [facility];  Service: Endoscopy;;   ROBOT ASSISTED  LAPAROSCOPIC RADICAL PROSTATECTOMY   2011   TONSILLECTOMY   age 33    and adenoids removed         Family History  Problem Relation Age of Onset   Gastric cancer Mother     Prostate cancer Father     Breast cancer Neg Hx     Pancreatic cancer Neg Hx      Social History:  reports that he quit smoking about 3 months ago. His smoking use included cigarettes. He has a 5.25 pack-year smoking history. He has never used smokeless tobacco. He reports that he does not drink alcohol and does not use drugs. Allergies:       Allergies  Allergen Reactions   Shellfish Allergy Swelling             Facility-Administered Medications Prior to Admission  Medication Dose Route Frequency Provider Last Rate Last Admin   betamethasone acetate-betamethasone sodium phosphate (CELESTONE) injection 3 mg  3 mg Intra-articular Once Daylene Katayama M, DPM       betamethasone acetate-betamethasone sodium phosphate (CELESTONE) injection 3 mg  3 mg Intra-articular Once Edrick Kins, DPM              Medications Prior to Admission  Medication Sig Dispense Refill   acetaminophen-codeine (TYLENOL #3) 300-30 MG tablet Take 1 tablet by mouth 2 (two) times daily.       aspirin EC 81 MG tablet Take 81 mg by mouth daily.       atorvastatin (LIPITOR) 40 MG tablet Take 1 tablet (40 mg total) by mouth every morning. 30 tablet 3   esomeprazole (NEXIUM) 20 MG capsule Take 20 mg by mouth daily.       furosemide (LASIX) 20 MG tablet Take 20 mg by mouth daily.       lisinopril-hydrochlorothiazide (PRINZIDE,ZESTORETIC) 10-12.5 MG tablet Take 1 tablet by mouth daily.       meloxicam (MOBIC) 15 MG tablet Take 1 tablet by mouth once daily 30 tablet 0   metoprolol tartrate (LOPRESSOR) 25 MG tablet Take 1/2 (one-half) tablet by mouth twice daily 30 tablet 0   Multiple Vitamins-Minerals (CENTRUM SILVER PO) Take 1 tablet by mouth daily.        oxybutynin (DITROPAN-XL) 10 MG 24 hr tablet Take 10 mg by mouth daily.       potassium chloride SA (KLOR-CON M) 20 MEQ tablet Take 10 mEq by mouth daily.       gabapentin (NEURONTIN) 100 MG capsule Take 1 capsule (100 mg total) by mouth 3 (three) times daily. (Patient not taking: Reported on 02/28/2023) 90 capsule 3   SPIKEVAX syringe            Home: Home Living Family/patient expects to be discharged to:: Private residence Living Arrangements: Alone Available Help at Discharge: Family, Friend(s), Available PRN/intermittently (church family) Type of Home: House Home Access: Level entry, Stairs to  enter [employer] of Steps: 2 Entrance Stairs-Rails: Right, Left Home Layout: One level Bathroom Shower/Tub: Walk-in shower, Tub/shower unit (walk in shower is in bedroom, tub shower in hall bath) Bathroom Toilet: Standard (has high commode in hall bathroom) Home Equipment: BSC/3in1, Conservation officer, nature (2 wheels), [employer] - single point (family is working on getting pt a hospital bed) Additional Comments: sister is Therapist, sports, is unsure how much help he will have initially at d/c.  Functional History: Prior Function Prior Level of Function : Independent/Modified Independent, Driving Mobility Comments: reports intermittent use of cane ADLs Comments: ind Functional Status:  Mobility: Bed Mobility Overal bed mobility: Needs Assistance Bed Mobility: Supine to Sit Supine to sit: Min assist Transfers Overall transfer level: Needs assistance Equipment used: Rolling walker (2 wheels) Transfers: Sit to/from Stand Sit to Stand: Mod assist General transfer comment: mod A from low BSC surface, cues for hand placement Ambulation/Gait Ambulation/Gait assistance: Min assist Gait Distance (Feet): 3 Feet Assistive device: Rolling walker (2 wheels) Gait Pattern/deviations:  (hop-to gait) General Gait Details: hop-to gait with reduced L foot clearance Gait velocity: reuced Gait velocity interpretation: <1.31 ft/sec, indicative of household ambulator   ADL: ADL Overall ADL's : Needs assistance/impaired Eating/Feeding: Set up Grooming: Set up, Sitting Upper Body Bathing: Min guard, Sitting Lower Body Bathing: Moderate assistance, Sitting/lateral leans Upper Body Dressing : Minimal assistance, Sitting Lower Body Dressing: Moderate assistance, Sitting/lateral leans, Bed level Toilet Transfer: Moderate assistance, Stand-pivot, BSC/3in1, Rolling walker (2 wheels) Toileting- Clothing Manipulation and Hygiene: Maximal assistance Functional mobility during ADLs: Moderate assistance, Rolling walker (2  wheels)   Cognition: Cognition Overall Cognitive Status: Within Functional Limits for tasks assessed Orientation Level: Oriented X4 Cognition Arousal/Alertness: Awake/alert Behavior During Therapy: WFL for tasks assessed/performed Overall Cognitive Status: Within Functional Limits for tasks assessed General Comments: at times tearful/emotional in regards to recent loss of limb   Blood pressure 115/76, pulse 82, temperature 98.6 F (37 C), temperature source Oral, resp. rate 18, height 6' (1.829 m), weight 100 kg, SpO2 98 %. Physical Exam Constitutional:      General: He is not in acute distress.    Appearance: He is normal weight.  HENT:     Head: Normocephalic and atraumatic.     Nose: Nose normal.     Mouth/Throat:     Comments: Full dentures Eyes:     Pupils: Pupils are equal, round, and reactive to light.  Cardiovascular:     Rate and Rhythm: Normal rate.  Pulmonary:     Effort: Pulmonary effort is normal.  Abdominal:     Palpations: Abdomen is soft.  Musculoskeletal:     Cervical back: Normal range of motion and neck supple.     Comments: Right AK stump swollen and tender  Skin:    Comments: Incision Clean, sl serosang drainage from medial and lateral edges. No dressing  Neurological:     Comments: Alert and oriented x 3. Normal insight and awareness. Intact Memory. Normal language and speech. Cranial nerve exam unremarkable. A bit tangential. UE motor 5/5. LLE 4- prox to 4/5 distally. Struggles lifting right residual limb currently  Psychiatric:        Mood and Affect: Mood normal.        Behavior: Behavior normal.        Lab Results Last 24 Hours       Results for orders placed or performed during the hospital encounter of 03/05/23 (from the past 24 hour(s))  Hemoglobin and hematocrit, blood     Status: Abnormal    Collection Time: 03/11/23  3:35 PM  Result Value Ref Range    Hemoglobin 9.2 (L) 13.0 - 17.0 g/dL    HCT 27.4 (L) 39.0 - 52.0 %  Renal function  panel     Status: Abnormal    Collection Time: 03/12/23  2:25 AM  Result Value Ref Range    Sodium 136 135 - 145 mmol/L    Potassium 4.6 3.5 - 5.1 mmol/L    Chloride 107 98 - 111 mmol/L    CO2 20 (L) 22 - 32 mmol/L    Glucose,  Bld 114 (H) 70 - 99 mg/dL    BUN 35 (H) 8 - 23 mg/dL    Creatinine, Ser 1.80 (H) 0.61 - 1.24 mg/dL    Calcium 9.0 8.9 - 10.3 mg/dL    Phosphorus 3.3 2.5 - 4.6 mg/dL    Albumin 1.9 (L) 3.5 - 5.0 g/dL    GFR, Estimated 39 (L) >60 mL/min    Anion gap 9 5 - 15  Magnesium     Status: None    Collection Time: 03/12/23  2:25 AM  Result Value Ref Range    Magnesium 1.9 1.7 - 2.4 mg/dL  CBC     Status: Abnormal    Collection Time: 03/12/23  2:25 AM  Result Value Ref Range    WBC 10.9 (H) 4.0 - 10.5 K/uL    RBC 3.12 (L) 4.22 - 5.81 MIL/uL    Hemoglobin 9.0 (L) 13.0 - 17.0 g/dL    HCT 27.8 (L) 39.0 - 52.0 %    MCV 89.1 80.0 - 100.0 fL    MCH 28.8 26.0 - 34.0 pg    MCHC 32.4 30.0 - 36.0 g/dL    RDW 16.0 (H) 11.5 - 15.5 %    Platelets 442 (H) 150 - 400 K/uL    nRBC 0.0 0.0 - 0.2 %      Imaging Results (Last 48 hours)  No results found.     Assessment/Plan: Diagnosis: 72 yo with PAD s/p right AKA. Family built ramp to enter home.   Does the need for close, 24 hr/day medical supervision in concert with the patient's rehab needs make it unreasonable for this patient to be served in a less intensive setting? Yes Co-Morbidities requiring supervision/potential complications:  -Acute on chronic renal failure, hydronephrosis, hx of prostate cancer -Acute on chronic anemia -pain mgt -wound care   Due to bladder management, bowel management, safety, skin/wound care, disease management, medication administration, pain management, and patient education, does the patient require 24 hr/day rehab nursing? Yes Does the patient require coordinated care of a physician, rehab nurse, therapy disciplines of PT, OT, neuropsych to address physical and functional deficits in the  context of the above medical diagnosis(es)? Yes Addressing deficits in the following areas: balance, endurance, locomotion, strength, transferring, bowel/bladder control, bathing, dressing, feeding, grooming, toileting, and psychosocial support Can the patient actively participate in an intensive therapy program of at least 3 hrs of therapy per day at least 5 days per week? Yes The potential for patient to make measurable gains while on inpatient rehab is excellent Anticipated functional outcomes upon discharge from inpatient rehab are modified independent and supervision  with PT, modified independent and supervision with OT, n/a with SLP.  W/c goals most likely. Estimated rehab length of stay to reach the above functional goals is: 8-12 days Anticipated discharge destination: Home Overall Rehab/Functional Prognosis: excellent   POST ACUTE RECOMMENDATIONS: This patient's condition is appropriate for continued rehabilitative care in the following setting: CIR Patient has agreed to participate in recommended program.  Yes, but he also wants sister to be part of decision Note that insurance prior authorization may be required for reimbursement for recommended care.   Comment: Rehab Admissions Coordinator to follow up. As above pt wants sister to be part of decision to come     MEDICAL RECOMMENDATIONS: Would recommend at least dry dressing + ACE to keep incision clean and protected Pre-treat with oxycodone prior to activity. Discussed massage and manual feedback to help with stump pain. Pt says he feels  a little depressed but doesn't want to talk with anyone about it. He would ultimately benefit from psychological counseling at some point to help with coping.       I have personally performed a face to face diagnostic evaluation of this patient. Additionally, I have examined the patient's medical record including any pertinent labs and radiographic images. If the physician assistant has  documented in this note, I have reviewed and edited or otherwise concur with the physician assistant's documentation.   Thanks,   Meredith Staggers, MD

## 2023-03-14 NOTE — Progress Notes (Signed)
PROGRESS NOTE   Subjective/Complaints:   Using urinal Oxycontin helped some- brought pain down to 5-6/10.   LBM yesterday- doesn't feel constipated.    ROS:  Pt denies SOB, abd pain, CP, N/V/C/D, and vision changes  Objective:   No results found. Recent Labs    03/13/23 0209 03/14/23 0549  WBC 9.4 9.2  HGB 8.7* 9.1*  HCT 27.1* 29.1*  PLT 385 414*   Recent Labs    03/13/23 0209 03/14/23 0549  NA 135 136  K 4.5 4.6  CL 102 104  CO2 21* 22  GLUCOSE 115* 111*  BUN 36* 41*  CREATININE 1.87* 1.88*  CALCIUM 8.9 9.1    Intake/Output Summary (Last 24 hours) at 03/14/2023 1707 Last data filed at 03/14/2023 1300 Gross per 24 hour  Intake 120 ml  Output 600 ml  Net -480 ml        Physical Exam: Vital Signs Blood pressure (!) 107/57, pulse 75, temperature 98 F (36.7 C), temperature source Oral, resp. rate 16, height 6' (1.829 m), weight 89.5 kg, SpO2 98 %.   General: awake, alert, appropriate, sitting up in bed; trying to use urinal; NAD HENT: conjugate gaze; oropharynx moist CV: regular rate; no JVD Pulmonary: CTA B/L; no W/R/R- good air movement GI: soft, NT, ND, (+)BS Psychiatric: appropriate- much more interactive Neurological: Ox3 Musculoskeletal:     Cervical back: Neck supple. No tenderness.     Comments: R-AKA with multiple ecchymotic areas--question early blister as well as blister? inner thigh covered with foam dressing. However except ecchymoses, staples intact, doesn't have more than moderate swelling and very TTP as expected UE strength 5-/5 throughout B/L LLE- 5-/5 in HF, KE, KF, DF and PF RLE- NT per pt request    Skin:    General: Skin is warm and dry.     Comments: IV R forearm looks OK Inner groin/thigh dressing from prior bypass R AKA incision per the MS  Neurological:     Mental Status: He is alert and oriented to person, place, and time.     Comments: Intact to light touch in  LLE and RLE/AKA  Assessment/Plan: 1. Functional deficits which require 3+ hours per day of interdisciplinary therapy in a comprehensive inpatient rehab setting. Physiatrist is providing close team supervision and 24 hour management of active medical problems listed below. Physiatrist and rehab team continue to assess barriers to discharge/monitor patient progress toward functional and medical goals  Care Tool:  Bathing    Body parts bathed by patient: Right arm, Left arm, Chest, Abdomen, Front perineal area, Right upper leg, Left upper leg, Face   Body parts bathed by helper: Buttocks, Left lower leg Body parts n/a: Right lower leg   Bathing assist Assist Level: Minimal Assistance - Patient > 75%     Upper Body Dressing/Undressing Upper body dressing   What is the patient wearing?: Pull over shirt    Upper body assist Assist Level: Set up assist    Lower Body Dressing/Undressing Lower body dressing      What is the patient wearing?: Incontinence brief, Pants     Lower body assist Assist for lower body dressing: Minimal Assistance - Patient >  75%     Toileting Toileting    Toileting assist Assist for toileting: Moderate Assistance - Patient 50 - 74%     Transfers Chair/bed transfer  Transfers assist     Chair/bed transfer assist level: Minimal Assistance - Patient > 75%     Locomotion Ambulation   Ambulation assist   Ambulation activity did not occur: Safety/medical concerns (faigue, AKA)          Walk 10 feet activity   Assist  Walk 10 feet activity did not occur: Safety/medical concerns        Walk 50 feet activity   Assist Walk 50 feet with 2 turns activity did not occur: Safety/medical concerns         Walk 150 feet activity   Assist Walk 150 feet activity did not occur: Safety/medical concerns         Walk 10 feet on uneven surface  activity   Assist Walk 10 feet on uneven surfaces activity did not occur: Safety/medical  concerns (faitgue, AKA)         Wheelchair     Assist Is the patient using a wheelchair?: Yes Type of Wheelchair: Manual    Wheelchair assist level: Supervision/Verbal cueing Max wheelchair distance: >150 ft    Wheelchair 50 feet with 2 turns activity    Assist        Assist Level: Supervision/Verbal cueing   Wheelchair 150 feet activity     Assist      Assist Level: Supervision/Verbal cueing   Blood pressure (!) 107/57, pulse 75, temperature 98 F (36.7 C), temperature source Oral, resp. rate 16, height 6' (1.829 m), weight 89.5 kg, SpO2 98 %.  Medical Problem List and Plan: 1. Functional deficits secondary to R AKA due to thrombosis of graft and lack of flow per ABI's.              -patient may  shower- cover incision             -ELOS/Goals: ~ 2 weeks supervision at w/c level  First day of evaluations- con't PT and OT 2.  Antithrombotics: -DVT/anticoagulation:  Pharmaceutical: Lovenox             -antiplatelet therapy: ASA.  3. Pain Management: Oxycodone prn. D/c dilaudid as not used for past 3 days             --add oxycontin for  more consistent relief. Flexeril prn for spasms. Pain has been uncontrolled, will attempt to get better control with long acting, and treating phantom pain             --reporting phantom pain also--may need to resume gabapentin.   3/20- will add Gabapentin 300 mg QHS 4. Mood/Behavior/Sleep: LCSW to follow for evaluation and support.             -antipsychotic agents: N/A.  --Continue ativan prn for anxiety. Consulted Dr. Sima Matas to help with coping/anxiety/limb loss. Doesn't want to look at AKA; also lost long term GF since hospital admission- needs help coping/with grief             --Trazodone added for insomnia prn.  5. Neuropsych/cognition: This patient is capable of making decisions on his  own behalf. 6. Skin/Wound Care: Monitor wound for healing. Monitor for blistering.   7. Fluids/Electrolytes/Nutrition: Monitor  I/O. Check CMET in am.              --Protein supplements added to promote wound healing.  8.  Leucocytosis/Fevers:  WBC 18.4-->9.4/ due to RLE cellulitis now s/p AKA.              --Completed ceftriaxone X 5 days  --Resolving and will continue to monitor.  9. HTN: Monitor BP TID. Continue metoprolol --continue to hold prinzide due to AKI and as BP controlled.  10. Acute on chronic renal failure: BUN/SCr trending down from 127/5.1-->38/1.87 --continue sodium bicarb as CO2  still elevated but improved 13-->21  3/20- Cr stable at 1.88- BUN very slightly higher at 41 from 36- and CO2 better at 22- con't to monitor 11. Acute on chronic anemia: Improved post transfusion 3 total units             --add iron supplement. Will con't to monitor  3/20- stable today- 9.1- better today- con't to monitor 13. Low protein stores: Add juven.  14. Abnormal LFTs: Continue to monitor. Recheck in am.  15. Elevated BS: Question stress/infection related. Will check Hgb A1c  3/20- will check A1c on Monday  16. GERD: Continue PPI (was better with Nexium)        LOS: 1 days A FACE TO FACE EVALUATION WAS PERFORMED  Saysha Menta 03/14/2023, 5:07 PM

## 2023-03-14 NOTE — Evaluation (Signed)
Occupational Therapy Assessment and Plan  Patient Details  Name: [name] MRN: [id number] Date of Birth: 05/05/1951  OT Diagnosis: acute pain, muscle weakness (generalized), decreased activity tolerance, and R AKA Rehab Potential: Rehab Potential (ACUTE ONLY): Good ELOS: 7-10 days   Today's Date: 03/14/2023 OT Individual Time: [id number] OT Individual Time Calculation (min): 75 min     Hospital Problem: Principal Problem:   Above knee amputation of right lower extremity ([place]) Active Problems:   CKD (chronic kidney disease) stage 3, GFR 30-59 ml/min (HCC)   Past Medical History:  Past Medical History:  Diagnosis Date   Family history of metabolic acidosis with increased anion gap 03/05/2023   GERD (gastroesophageal reflux disease)    History of kidney stones    Hyperlipidemia    Hypertension    Prostate cancer [facility]) 2011   radiation june 2020   Umbilical hernia    Wears dentures    full   Wears glasses    Wears partial dentures    lower   Past Surgical History:  Past Surgical History:  Procedure Laterality Date   ABDOMINAL AORTOGRAM W/LOWER EXTREMITY N/A 11/20/2022   Procedure: ABDOMINAL AORTOGRAM W/LOWER EXTREMITY;  Surgeon: Waynetta Sandy, MD;  Location: [place] [facility];  Service: Cardiovascular;  Laterality: N/A;   ABDOMINAL AORTOGRAM W/LOWER EXTREMITY N/A 03/05/2023   Procedure: ABDOMINAL AORTOGRAM W/LOWER EXTREMITY;  Surgeon: Waynetta Sandy, MD;  Location: [place] [facility];  Service: Cardiovascular;  Laterality: N/A;   AMPUTATION Right 03/09/2023   Procedure: AMPUTATION ABOVE KNEE;  Surgeon: Waynetta Sandy, MD;  Location: [place];  Service: Vascular;  Laterality: Right;   COLONOSCOPY N/A 08/25/2020   Procedure: COLONOSCOPY;  Surgeon: Daneil Dolin, MD;  [facility];  Service: Endoscopy;  Laterality: N/A;  9:30   COLONOSCOPY     CYSTOSCOPY WITH LITHOLAPAXY N/A 05/18/2021   Procedure: CYSTOSCOPY WITH LITHOLAPAXY;   Surgeon: Ceasar Mons, MD;  Location: [facility];  Service: Urology;  Laterality: N/A;  ONLY NEEDS  30 MIN   FEMORAL-TIBIAL BYPASS GRAFT Right 12/12/2022   Procedure: RIGHT COMMON FEMORAL-POSTERIOR TIBIAL ARTERY BYPASS WITH VEIN HARVESTING OF THE GREATER SAPHENOUS VEIN AND FEMORAL ENDARTERECTOMY WITH COMPOSITE GRAFT;  Surgeon: Waynetta Sandy, MD;  Location: [place];  Service: Vascular;  Laterality: Right;   POLYPECTOMY  08/25/2020   Procedure: POLYPECTOMY;  Surgeon: Daneil Dolin, MD;  [facility];  Service: Endoscopy;;   ROBOT ASSISTED LAPAROSCOPIC RADICAL PROSTATECTOMY  2011   TONSILLECTOMY  age 72   and adenoids removed    Assessment & Plan Clinical Impression: Patient is a 72 year old male with history of HTN, prostate CA, GERD, CKD, PAD with right ankle ulcer s/p Fem-tib bypass for critical limb ischemia who had failure of graft with post op edema, significant pain and extensive wounds. He was admitted on 03/05/23 for R-AKA by Dr. Donzetta Matters. Limb not felt to be salvageable and he was admitted on 03/11 for attempt at angiography which showed chronically thrombosed anterior/posterior tibial arteries. Post procedure with drop in Hgb to 5.9 and was transfused with 2 units PRBC. He continued to have difficulty processing limb loss with multiple conversations. He developed acute on chronic renal failure 03/12 and was found to have moderate right and mild left hydronephrosis therefore foley placed. On 03/14, he developed fever- 101F, tachycardia, leucocytosis with WBC- 18.4 and noted to have elevated CK-[id number] (question rhabdomyolysis). He was started on IV ceftriaxone for RLE cellulitis and  underwent R-AKA on 03/15. ABLA with hgb drop to 7.4 treated with one unit PRBC on 03/17 and completes antibiotic course today. As AKI resolving, foley was discontinued and passed voiding trial. PT/OT has been working with patient who continues to be limited by pain, fear,  anxiety and weakness. Patient transferred to CIR on 03/13/2023 .    Patient currently requires min with basic self-care skills and functional transfers  secondary to muscle weakness, decreased cardiorespiratoy endurance, and R AKA .  Prior to hospitalization, patient could complete all ADLs/IADLs independently.  Patient will benefit from skilled intervention to increase independence with basic self-care skills and increase level of independence with iADL prior to discharge  home with intermediate supervision .  Anticipate patient will require intermittent supervision and follow up home health.  OT - End of Session Activity Tolerance: Tolerates 10 - 20 min activity with multiple rests Endurance Deficit: Yes OT Assessment Rehab Potential (ACUTE ONLY): Good OT Barriers to Discharge: Decreased caregiver support;Home environment access/layout;Incontinence;Lack of/limited family support OT Patient demonstrates impairments in the following area(s): Balance;Endurance;Pain OT Basic ADL's Functional Problem(s): Bathing;Dressing;Toileting OT Advanced ADL's Functional Problem(s): Simple Meal Preparation;Light Housekeeping OT Transfers Functional Problem(s): Toilet;Tub/Shower OT Plan OT Intensity: Minimum of 1-2 x/day, 45 to 90 minutes OT Frequency: 5 out of 7 days OT Duration/Estimated Length of Stay: 7-10 days OT Treatment/Interventions: Balance/vestibular training;Community reintegration;Discharge planning;DME/adaptive equipment instruction;Functional mobility training;Pain management;Patient/family education;Psychosocial support;Self Care/advanced ADL retraining;Therapeutic Activities;Therapeutic Exercise;UE/LE Strength taining/ROM;Wheelchair propulsion/positioning OT Basic Self-Care Anticipated Outcome(s): Mod I WC level OT Toileting Anticipated Outcome(s): Mod I WC level OT Bathroom Transfers Anticipated Outcome(s): Mod I WC level OT Recommendation Recommendations for Other Services: Therapeutic  Recreation consult Therapeutic Recreation Interventions: Stress management;Pet therapy Patient destination: Home Follow Up Recommendations: Home health OT Equipment Recommended: To be determined   OT Evaluation Precautions/Restrictions  Precautions Precautions: Fall Precaution Comments: phantom limb pain RLE Restrictions Weight Bearing Restrictions: Yes RLE Weight Bearing: Non weight bearing General Chart Reviewed: Yes Family/Caregiver Present: No Home Living/Prior Functioning Home Living Family/patient expects to be discharged to:: Private residence Living Arrangements: Alone Available Help at Discharge: Family, Friend(s), Available PRN/intermittently ([employer] friends installed ramp.) Type of Home: House Home Access: Level entry, Stairs to enter (Front=2 steps, Side=6 steps) Technical brewer of Steps: 2 Home Layout: One level Bathroom Shower/Tub: Walk-in shower, Tub/shower unit (Walk in shower is in bedroom, tub shower in hall bath.) Bathroom Toilet: Standard (Toilet in bedroom is low, toilet in hallway is higher than one in bedroom.) Bathroom Accessibility: Yes  Lives With: Alone Vision Baseline Vision/History: 1 Wears glasses Ability to See in Adequate Light: 0 Adequate Patient Visual Report: No change from baseline Vision Assessment?: No apparent visual deficits Perception  Perception: Within Functional Limits Praxis Praxis: Intact Cognition Cognition Overall Cognitive Status: Within Functional Limits for tasks assessed Arousal/Alertness: Awake/alert Orientation Level: Person;Place;Situation Memory: Appears intact Awareness: Appears intact Problem Solving: Appears intact Safety/Judgment: Appears intact Brief Interview for Mental Status (BIMS) Repetition of Three Words (First Attempt): 3 Temporal Orientation: Year: Correct Temporal Orientation: Month: Accurate within 5 days Temporal Orientation: Day: Correct Recall: "Sock": Yes, no cue required Recall:  "Blue": Yes, no cue required Recall: "Bed": Yes, no cue required BIMS Summary Score: 15 Sensation Sensation Light Touch: Impaired Detail Peripheral sensation comments: Intermediate numbness/tingling in bilateral hands, dependent on position while sleeping. Light Touch Impaired Details: Impaired RLE Coordination Gross Motor Movements are Fluid and Coordinated: No Fine Motor Movements are Fluid and Coordinated: Yes Motor  Motor Motor - Skilled Clinical Observations: R AKA  Trunk/Postural Assessment  Cervical Assessment Cervical Assessment: Within Functional Limits Thoracic Assessment Thoracic Assessment: Within Functional Limits Lumbar Assessment Lumbar Assessment: Within Functional Limits Postural Control Postural Control: Within Functional Limits  Balance Balance Balance Assessed: Yes Static Sitting Balance Static Sitting - Balance Support: Feet supported Static Sitting - Level of Assistance: 5: Stand by assistance Dynamic Sitting Balance Dynamic Sitting - Balance Support: During functional activity Dynamic Sitting - Level of Assistance: 5: Stand by assistance (CGA) Static Standing Balance Static Standing - Balance Support: Bilateral upper extremity supported;During functional activity Static Standing - Level of Assistance: 5: Stand by assistance (CGA) Extremity/Trunk Assessment RUE Assessment RUE Assessment: Within Functional Limits LUE Assessment LUE Assessment: Within Functional Limits  Care Tool Care Tool Self Care Eating   Eating Assist Level: Independent with assistive device    Oral Care    Oral Care Assist Level: Set up assist    Bathing   Body parts bathed by patient: Right arm;Left arm;Chest;Abdomen;Front perineal area;Right upper leg;Left upper leg;Face Body parts bathed by helper: Buttocks;Left lower leg Body parts n/a: Right lower leg Assist Level: Minimal Assistance - Patient > 75%    Upper Body Dressing(including orthotics)   What is the patient  wearing?: Pull over shirt   Assist Level: Set up assist    Lower Body Dressing (excluding footwear)   What is the patient wearing?: Incontinence brief;Pants Assist for lower body dressing: Minimal Assistance - Patient > 75%    Putting on/Taking off footwear   What is the patient wearing?: Non-skid slipper socks Assist for footwear: Maximal Assistance - Patient 25 - 49%       Care Tool Toileting Toileting activity   Assist for toileting: Moderate Assistance - Patient 50 - 74%     Care Tool Bed Mobility Roll left and right activity    Defer to PT eval    Sit to lying activity    Defer to PT eval    Lying to sitting on side of bed activity   Lying to sitting on side of bed assist level: the ability to move from lying on the back to sitting on the side of the bed with no back support.: Supervision/Verbal cueing     Care Tool Transfers Sit to stand transfer   Sit to stand assist level: Minimal Assistance - Patient > 75%    Chair/bed transfer   Chair/bed transfer assist level: Minimal Assistance - Patient > 75%     Toilet transfer   Assist Level: Minimal Assistance - Patient > 75%     Care Tool Cognition  Expression of Ideas and Wants Expression of Ideas and Wants: 4. Without difficulty (complex and basic) - expresses complex messages without difficulty and with speech that is clear and easy to understand  Understanding Verbal and Non-Verbal Content Understanding Verbal and Non-Verbal Content: 4. Understands (complex and basic) - clear comprehension without cues or repetitions   Memory/Recall Ability Memory/Recall Ability : Current season;Location of own room;Staff names and faces;That he or she is in a hospital/hospital unit   Refer to Care Plan for Long Term Goals  SHORT TERM GOAL WEEK 1 OT Short Term Goal 1 (Week 1): STGs=LTGs due to patient's length of stay.  Recommendations for other services: Therapeutic Recreation  Pet therapy and Stress management   Skilled  Therapeutic Intervention Pt received resting in bed for skilled OT session with focus on BADL retraining. Pt agreeable to interventions, despite demonstrating overall liable/tearful mood/affect. Pt with fluctuating pain at surgical site, stating "I'm  still having a hard time looking at this leg. . . " in reference to R residual limb. OT offering intermediate rest breaks and positioning suggestions throughout session to address pain/fatigue and maximize participation/safety in session.   Session began with general introduction to OT role, OT POC, and orientation to rehab schedule/unit. Pt with continent void of urine using urinal at bedside with overall Min A for initial placement. Pt then with bowel urgency, transferring onto [facility] with Min A + RW stand-pivot. Pt completes sponge-bathing seated on BSC with levels of assist noted below.   Pt remained sitting in recliner with all immediate needs met at end of session. Pt continues to be appropriate for skilled OT intervention to promote further functional independence.   ADL ADL Eating: Modified independent Where Assessed-Eating: Bed level Grooming: Setup Where Assessed-Grooming: Edge of bed Upper Body Bathing: Setup Where Assessed-Upper Body Bathing: Chair Lower Body Bathing: Minimal assistance Where Assessed-Lower Body Bathing: Chair Upper Body Dressing: Setup Where Assessed-Upper Body Dressing: Chair Lower Body Dressing: Minimal assistance Where Assessed-Lower Body Dressing: Chair Toileting: Contact guard Where Assessed-Toileting: Bedside Commode Toilet Transfer: Minimal assistance Toilet Transfer Method: Stand pivot Toilet Transfer Equipment: Geophysical data processor Method: Radiographer, therapeutic: Transfer tub bench Mobility  Bed Mobility Bed Mobility: Supine to Sit Supine to Sit: Supervision/Verbal cueing Transfers Sit to Stand: Minimal Assistance - Patient > 75% Stand to Sit: Minimal Assistance -  Patient > 75%  Discharge Criteria: Patient will be discharged from OT if patient refuses treatment 3 consecutive times without medical reason, if treatment goals not met, if there is a change in medical status, if patient makes no progress towards goals or if patient is discharged from hospital.  The above assessment, treatment plan, treatment alternatives and goals were discussed and mutually agreed upon: by patient  Maudie Mercury, OTR/L, MSOT  03/14/2023, 12:27 PM

## 2023-03-14 NOTE — Plan of Care (Signed)
  Problem: RH Balance Goal: LTG Patient will maintain dynamic standing with ADLs (OT) Description: LTG:  Patient will maintain dynamic standing balance with assist during activities of daily living (OT)  Flowsheets (Taken 03/14/2023 1254) LTG: Pt will maintain dynamic standing balance during ADLs with: Independent with assistive device   Problem: Sit to Stand Goal: LTG:  Patient will perform sit to stand in prep for activites of daily living with assistance level (OT) Description: LTG:  Patient will perform sit to stand in prep for activites of daily living with assistance level (OT) Flowsheets (Taken 03/14/2023 1254) LTG: PT will perform sit to stand in prep for activites of daily living with assistance level: Independent with assistive device   Problem: RH Bathing Goal: LTG Patient will bathe all body parts with assist levels (OT) Description: LTG: Patient will bathe all body parts with assist levels (OT) Flowsheets (Taken 03/14/2023 1254) LTG: Pt will perform bathing with assistance level/cueing: Independent with assistive device    Problem: RH Dressing Goal: LTG Patient will perform lower body dressing w/assist (OT) Description: LTG: Patient will perform lower body dressing with assist, with/without cues in positioning using equipment (OT) Flowsheets (Taken 03/14/2023 1254) LTG: Pt will perform lower body dressing with assistance level of: Independent with assistive device   Problem: RH Toileting Goal: LTG Patient will perform toileting task (3/3 steps) with assistance level (OT) Description: LTG: Patient will perform toileting task (3/3 steps) with assistance level (OT)  Flowsheets (Taken 03/14/2023 1254) LTG: Pt will perform toileting task (3/3 steps) with assistance level: Independent with assistive device   Problem: RH Simple Meal Prep Goal: LTG Patient will perform simple meal prep w/assist (OT) Description: LTG: Patient will perform simple meal prep with assistance, with/without  cues (OT). Flowsheets (Taken 03/14/2023 1254) LTG: Pt will perform simple meal prep with assistance level of: Independent with assistive device LTG: Pt will perform simple meal prep w/level of: Wheelchair level   Problem: RH Light Housekeeping Goal: LTG Patient will perform light housekeeping w/assist (OT) Description: LTG: Patient will perform light housekeeping with assistance, with/without cues (OT). Flowsheets (Taken 03/14/2023 1254) LTG: Pt will perform light housekeeping with assistance level of: Independent with assistive device LTG: Pt will perform light housekeeping w/level of: Wheelchair level   Problem: RH Toilet Transfers Goal: LTG Patient will perform toilet transfers w/assist (OT) Description: LTG: Patient will perform toilet transfers with assist, with/without cues using equipment (OT) Flowsheets (Taken 03/14/2023 1254) LTG: Pt will perform toilet transfers with assistance level of: Independent with assistive device   Problem: RH Tub/Shower Transfers Goal: LTG Patient will perform tub/shower transfers w/assist (OT) Description: LTG: Patient will perform tub/shower transfers with assist, with/without cues using equipment (OT) Flowsheets (Taken 03/14/2023 1254) LTG: Pt will perform tub/shower stall transfers with assistance level of: Independent with assistive device

## 2023-03-14 NOTE — Progress Notes (Signed)
Met with patient.  Updated the board in room. Discussed education binder and said that nurse had already gone over information. Educated on discharge at [place] and to have sister come in and will go over discharge instructions, follow up appts and medications. Patient reports that can be tearful at times about the amputation but it was the thing to do and that speaks to his preacher about. Doesn't need the chaplin here.  Patient took a call and I left room.

## 2023-03-14 NOTE — Progress Notes (Signed)
Physical Therapy Assessment and Plan  Patient Details  Name: [name] MRN: [id number] Date of Birth: 1951/08/18  PT Diagnosis: Coordination disorder, Difficulty walking, and Pain in right phantom limb Rehab Potential: Good ELOS: 7-10 days   Today's Date: 03/14/2023 PT Individual Time: 1000-1100, 1415-1528 PT Individual Time Calculation (min): 60 min, 32 min    Hospital Problem: Principal Problem:   Above knee amputation of right lower extremity ([place]) Active Problems:   CKD (chronic kidney disease) stage 3, GFR 30-59 ml/min ([place])   Past Medical History:  Past Medical History:  Diagnosis Date   Family history of metabolic acidosis with increased anion gap 03/05/2023   GERD (gastroesophageal reflux disease)    History of kidney stones    Hyperlipidemia    Hypertension    Prostate cancer [facility]) 2011   radiation june 2020   Umbilical hernia    Wears dentures    full   Wears glasses    Wears partial dentures    lower   Past Surgical History:  Past Surgical History:  Procedure Laterality Date   ABDOMINAL AORTOGRAM W/LOWER EXTREMITY N/A 11/20/2022   Procedure: ABDOMINAL AORTOGRAM W/LOWER EXTREMITY;  Surgeon: Waynetta Sandy, MD;  Location: [place] [facility];  Service: Cardiovascular;  Laterality: N/A;   ABDOMINAL AORTOGRAM W/LOWER EXTREMITY N/A 03/05/2023   Procedure: ABDOMINAL AORTOGRAM W/LOWER EXTREMITY;  Surgeon: Waynetta Sandy, MD;  Location: [place] [facility];  Service: Cardiovascular;  Laterality: N/A;   AMPUTATION Right 03/09/2023   Procedure: AMPUTATION ABOVE KNEE;  Surgeon: Waynetta Sandy, MD;  Location: [place];  Service: Vascular;  Laterality: Right;   COLONOSCOPY N/A 08/25/2020   Procedure: COLONOSCOPY;  Surgeon: Daneil Dolin, MD;  [facility];  Service: Endoscopy;  Laterality: N/A;  9:30   COLONOSCOPY     CYSTOSCOPY WITH LITHOLAPAXY N/A 05/18/2021   Procedure: CYSTOSCOPY WITH LITHOLAPAXY;  Surgeon: Ceasar Mons, MD;  Location: [facility];  Service: Urology;  Laterality: N/A;  ONLY NEEDS  30 MIN   FEMORAL-TIBIAL BYPASS GRAFT Right 12/12/2022   Procedure: RIGHT COMMON FEMORAL-POSTERIOR TIBIAL ARTERY BYPASS WITH VEIN HARVESTING OF THE GREATER SAPHENOUS VEIN AND FEMORAL ENDARTERECTOMY WITH COMPOSITE GRAFT;  Surgeon: Waynetta Sandy, MD;  Location: [place];  Service: Vascular;  Laterality: Right;   POLYPECTOMY  08/25/2020   Procedure: POLYPECTOMY;  Surgeon: Daneil Dolin, MD;  [facility];  Service: Endoscopy;;   ROBOT ASSISTED LAPAROSCOPIC RADICAL PROSTATECTOMY  2011   TONSILLECTOMY  age 13   and adenoids removed    Assessment & Plan Clinical Impression: Patient is a 72 y.o. year old male  with history of HTN, prostate CA, GERD, CKD, PAD with right ankle ulcer s/p Fem-tib bypass for critical limb ischemia who had failure of graft with post op edema, significant pain and extensive wounds. He was admitted on 03/05/23 for R-AKA by Dr. Donzetta Matters. Limb not felt to be salvageable and he was admitted on 03/11 for attempt at angiography which showed chronically thrombosed anterior/posterior tibial arteries. Post procedure with drop in Hgb to 5.9 and was transfused with 2 units PRBC. He continued to have difficulty processing limb loss with multiple conversations. He developed acute on chronic renal failure 03/12 and was found to have moderate right and mild left hydronephrosis therefore foley placed.  On 03/14, he developed fever- 101F,  tachycardia, leucocytosis with WBC- 18.4 and noted to have elevated [id number] (question rhabdomyolysis).  He was started on IV ceftriaxone for RLE  cellulitis and underwent R-AKA on 03/15. ABLA with hgb drop to 7.4 treated with one unit PRBC on 03/17 and completes antibiotic course today. As AKI resolving, foley was discontinued and passed voiding trial. PT/OT has been working with patient who continues to be limited by pain, fear, anxiety and  weakness.  He has had decline in functional status and CIR recommended by rehab team.    Prior to admission, patient lived alone and was independent and working prior to 11/2022. Has friends and church family who can assist after discharge.    Pt reports pain is 8-10/10- and extremely painful- he actually says pain >10/10- however explained 10/10 is the max.  He notes that meds help improve things down to 5-7/10 at best, but then it comes back when pain meds wear off.  Having a lot of phantom pain and sensation- can feel foot and it's painful.  LBM today- depends on intake.  No SOB except when he gets anxious, and then that's doing better.  He does NOT want to look at amputation and it REALLY bothers him.  Patient transferred to CIR on 03/13/2023 .   Patient currently requires min with mobility secondary to muscle weakness, decreased cardiorespiratoy endurance, decreased coordination, and decreased standing balance and decreased postural control.  Prior to hospitalization, patient was modified independent  with mobility and lived with Alone in a House home.  Home access is 2Ramped entrance.  Patient will benefit from skilled PT intervention to maximize safe functional mobility, minimize fall risk, and decrease caregiver burden for planned discharge home with 24 hour supervision.  Anticipate patient will benefit from follow up [place] at discharge.  PT - End of Session Activity Tolerance: Tolerates 30+ min activity with multiple rests Endurance Deficit: Yes Endurance Deficit Description: decreased PT Assessment Rehab Potential (ACUTE/IP ONLY): Good PT Barriers to Discharge: Decreased caregiver support;Home environment access/layout PT Barriers to Discharge Comments: house, 1 level, ramp, sister able to proivde 24/7, bathrooms are not w/c accessible PT Patient demonstrates impairments in the following area(s): Balance;Pain;Endurance;Motor PT Transfers Functional Problem(s): Bed Mobility;Bed to  Chair;Car PT Locomotion Functional Problem(s): Ambulation;Wheelchair Mobility PT Plan PT Intensity: Minimum of 1-2 x/day ,45 to 90 minutes PT Frequency: 5 out of 7 days PT Duration Estimated Length of Stay: 7-10 days PT Treatment/Interventions: Ambulation/gait training;Discharge planning;DME/adaptive equipment instruction;Functional mobility training;Pain management;Psychosocial support;Therapeutic Activities;UE/LE Strength taining/ROM;Balance/vestibular training;Community reintegration;Disease management/prevention;Neuromuscular re-education;Patient/family education;Therapeutic Exercise;UE/LE Coordination activities;Wheelchair propulsion/positioning PT Transfers Anticipated Outcome(s): Mod I PT Locomotion Anticipated Outcome(s): CGA PT Recommendation Recommendations for Other Services: Therapeutic Recreation consult Therapeutic Recreation Interventions: Stress management Follow Up Recommendations: Home health PT Patient destination: Home Equipment Recommended: Wheelchair (measurements);Other (comment) Equipment Details: w/c 18 x 18   PT Evaluation Precautions/Restrictions Precautions Precautions: Fall Precaution Comments: phantom limb pain RLE Restrictions Weight Bearing Restrictions: Yes RLE Weight Bearing: Non weight bearing Pain Interference Pain Interference Pain Effect on Sleep: 1. Rarely or not at all Pain Interference with Therapy Activities: 1. Rarely or not at all Pain Interference with Day-to-Day Activities: 1. Rarely or not at all Home Living/Prior [place] Available Help at Discharge: Family;Friend(s);Available PRN/intermittently Type of Home: House Home Access: Ramped entrance Home Layout: One level Bathroom Shower/Tub: Walk-in shower;Tub/shower unit Bathroom Toilet: Handicapped height Bathroom Accessibility: Yes Additional Comments: sister able to provide 24/7  Lives With: Alone Prior Function Level of Independence: Requires assistive device  for independence  Able to Take Stairs?: Yes Driving: No Cognition Overall Cognitive Status: Within Functional Limits for tasks assessed Arousal/Alertness: Awake/alert Orientation Level: Oriented  X4 Memory: Appears intact Awareness: Appears intact Problem Solving: Appears intact Safety/Judgment: Appears intact Sensation Sensation Light Touch: Appears Intact Proprioception: Appears Intact Coordination Gross Motor Movements are Fluid and Coordinated: No Fine Motor Movements are Fluid and Coordinated: Yes Coordination and Movement Description: grossly uncoordinated 2/2 R AKA Finger Nose Finger Test: [facility] Heel Shin Test: NT Motor  Motor Motor: Other (comment) (R AKA) Motor - Skilled Clinical Observations: Grossly uncoordinated 2/2 R AKA   Trunk/Postural Assessment  Cervical Assessment Cervical Assessment: Within Functional Limits Thoracic Assessment Thoracic Assessment: Within Functional Limits Lumbar Assessment Lumbar Assessment: Within Functional Limits Postural Control Postural Control: Deficits on evaluation Protective Responses: impaired  Balance Balance Balance Assessed: Yes Static Sitting Balance Static Sitting - Balance Support: Feet supported Static Sitting - Level of Assistance: 6: Modified independent (Device/Increase time) Dynamic Sitting Balance Dynamic Sitting - Balance Support: During functional activity Dynamic Sitting - Level of Assistance: 5: Stand by assistance (Supervision) Static Standing Balance Static Standing - Balance Support: Bilateral upper extremity supported;During functional activity Static Standing - Level of Assistance: 5: Stand by assistance (supervision) Dynamic Standing Balance Dynamic Standing - Balance Support: Left upper extremity supported;During functional activity Dynamic Standing - Level of Assistance: 5: Stand by assistance (CGA) Dynamic Standing - Balance Activities: Forward lean/weight shifting;Reaching for objects;Reaching  across midline Extremity Assessment  RLE Assessment RLE Assessment: Exceptions to [facility] General Strength Comments: Grossly 3+/5 hip flexion, abdcution, adduction LLE Assessment LLE Assessment: Exceptions to [facility] General Strength Comments: grossly 4+/5  Care Tool Care Tool Bed Mobility Roll left and right activity Roll left and right activity did not occur: Safety/medical concerns (fatigue)      Sit to lying activity Sit to lying activity did not occur: Safety/medical concerns (fatigue)      Lying to sitting on side of bed activity Lying to sitting on side of bed activity did not occur: the ability to move from lying on the back to sitting on the side of the bed with no back support.: Safety/medical concerns (fatigue) Lying to sitting on side of bed assist level: the ability to move from lying on the back to sitting on the side of the bed with no back support.: Supervision/Verbal cueing     Care Tool Transfers Sit to stand transfer   Sit to stand assist level: Minimal Assistance - Patient > 75%    Chair/bed transfer   Chair/bed transfer assist level: Minimal Assistance - Patient > 75%     Toilet transfer   Assist Level: Minimal Assistance - Patient > 75%    Car transfer   Car transfer assist level: Minimal Assistance - Patient > 75%      Care Tool Locomotion Ambulation Ambulation activity did not occur: Safety/medical concerns (faigue, AKA)        Walk 10 feet activity Walk 10 feet activity did not occur: Safety/medical concerns       Walk 50 feet with 2 turns activity Walk 50 feet with 2 turns activity did not occur: Safety/medical concerns      Walk 150 feet activity Walk 150 feet activity did not occur: Safety/medical concerns      Walk 10 feet on uneven surfaces activity Walk 10 feet on uneven surfaces activity did not occur: Safety/medical concerns (faitgue, AKA)      Stairs Stair activity did not occur: Safety/medical concerns (fatigue, AKA)        Walk  up/down 1 step activity Walk up/down 1 step or curb (drop down) activity did not occur: Safety/medical concerns  Walk up/down 4 steps activity Walk up/down 4 steps activity did not occur: Safety/medical concerns      Walk up/down 12 steps activity Walk up/down 12 steps activity did not occur: Safety/medical concerns      Pick up small objects from floor Pick up small object from the floor (from standing position) activity did not occur: Safety/medical concerns (fatigue, AKA, poor balance)      Wheelchair Is the patient using a wheelchair?: Yes Type of Wheelchair: Manual   Wheelchair assist level: Supervision/Verbal cueing Max wheelchair distance: >150 ft  Wheel 50 feet with 2 turns activity   Assist Level: Supervision/Verbal cueing  Wheel 150 feet activity   Assist Level: Supervision/Verbal cueing    Refer to Care Plan for Long Term Goals  SHORT TERM GOAL WEEK 1 PT Short Term Goal 1 (Week 1): STG=LTG due to ELOS  Recommendations for other services: Therapeutic Recreation  Stress management  Skilled Therapeutic Intervention Mobility Bed Mobility Bed Mobility: Not assessed Transfers Transfers: Sit to Stand;Stand to Sit;Stand Pivot Transfers Sit to Stand: Minimal Assistance - Patient > 75% Stand to Sit: Minimal Assistance - Patient > 75% Stand Pivot Transfers: Minimal Assistance - Patient > 75% Stand Pivot Transfer Details: Verbal cues for safe use of DME/AE Transfer (Assistive device): Rolling walker Locomotion  Gait Ambulation: No Gait Gait: No Stairs / Additional Locomotion Stairs: No Wheelchair Mobility Wheelchair Mobility: Yes Wheelchair Assistance: Chartered loss adjuster: Both upper extremities Wheelchair Parts Management: Needs assistance Distance: >150 ft   Treatment Session 1:   Pt received seated in recliner at bedside and oriented to [employer] and procedures. Pt declines pain in session.PT assessed pain interference,  sensation, coordination and strength.  Pt requires min A with sit to stand and stand pivot to w/c, requires min cues for AD management.  Pt self-propelled w/c ~150 ft to ortho gym and performed car transfer via stand pivot with RW with min A. Pt transported remaining distance by w/c total A for time management and energy conservation. Pt left seated in recliner at bedside with all needs in reach and alarm on, safety plan updated.    Treatment Session 2:   Pt received seated in recliner at bedside with sister present. Pt agreeable to PT session with an emphasis on transfer and LE strength training. Pt reports right phantom limb pain and educated on desensitization techniques for relief. Pt sister reports in the event pt would require 24/7 she would be able to assist. Pt requires min A for R sock and shoe placement in sitting position.   Pt requests to void and requires min A for sit to stand and for urinal placement. Pt requires CGA while he held urinal and successfully voided. Pt able to tolerate standing ~5 minutes as patient replaced brief.   Pt min A for stand pivot with RW for transfer to w/c and self-propelled w/c ~125 ft. Pt performed following exercises to address LE strength deficits:   -R LE hip flexion 3 x 6   -R LE hip abduction 3 x 6  -L LE single leg bridge 3 x 5  -R LE S/L hip extension 2 x 5   Pt min A for stand pivot to w/c and transported total A for time management to room. Pt left seated in recliner at bedside with chair alarm on and all needs within reach.    Discharge Criteria: Patient will be discharged from PT if patient refuses treatment 3 consecutive times without medical reason, if treatment goals  not met, if there is a change in medical status, if patient makes no progress towards goals or if patient is discharged from hospital.  The above assessment, treatment plan, treatment alternatives and goals were discussed and mutually agreed upon: by patient and by  family  Tanja Port PT, DPT  03/14/2023, 3:04 PM

## 2023-03-14 NOTE — Progress Notes (Signed)
[facility] Individual Statement of Services  Patient Name:  [name]  Date:  03/14/2023  Welcome to the [facility].  Our goal is to provide you with an individualized program based on your diagnosis and situation, designed to meet your specific needs.  With this comprehensive rehabilitation program, you will be expected to participate in at least 3 hours of rehabilitation therapies Monday-Friday, with modified therapy programming on the weekends.  Your rehabilitation program will include the following services:  Physical Therapy (PT), Occupational Therapy (OT), 24 hour per day rehabilitation nursing, Therapeutic Recreaction (TR), Neuropsychology, Care Coordinator, Rehabilitation Medicine, Nutrition Services, and Pharmacy Services  Weekly team conferences will be held on Tuesday to discuss your progress.  Your Inpatient Rehabilitation Care Coordinator will talk with you frequently to get your input and to update you on team discussions.  Team conferences with you and your family in attendance may also be held.  Expected length of stay: 7-10 days  Overall anticipated outcome: Independent with device  Depending on your progress and recovery, your program may change. Your Inpatient Rehabilitation Care Coordinator will coordinate services and will keep you informed of any changes. Your Inpatient Rehabilitation Care Coordinator's name and contact numbers are listed  below.  The following services may also be recommended but are not provided by the [facility] will be made to provide these services after discharge if needed.  Arrangements include referral to agencies that provide these services.  Your insurance has been verified to be:  [employer]-medicare Your primary doctor is:  Iona Beard  Pertinent information will be shared with your doctor and your insurance company.  Inpatient Rehabilitation Care Coordinator:  Ovidio Kin, [employer] or Emilia Beck  Information discussed with and copy given to patient by: Elease Hashimoto, 03/14/2023, 9:58 AM

## 2023-03-14 NOTE — Progress Notes (Signed)
Inpatient Rehabilitation Care Coordinator Assessment and Plan Patient Details  Name: [name] MRN: [id number] Date of Birth: [birth year]  Today's Date: 03/14/2023  Hospital Problems: Principal Problem:   Above knee amputation of right lower extremity ([place]) Active Problems:   CKD (chronic kidney disease) stage 3, GFR 30-59 ml/min ([place])  Past Medical History:  Past Medical History:  Diagnosis Date   Family history of metabolic acidosis with increased anion gap 03/05/2023   GERD (gastroesophageal reflux disease)    History of kidney stones    Hyperlipidemia    Hypertension    Prostate cancer [facility]) 2011   radiation june 2020   Umbilical hernia    Wears dentures    full   Wears glasses    Wears partial dentures    lower   Past Surgical History:  Past Surgical History:  Procedure Laterality Date   ABDOMINAL AORTOGRAM W/LOWER EXTREMITY N/A 11/20/2022   Procedure: ABDOMINAL AORTOGRAM W/LOWER EXTREMITY;  Surgeon: Waynetta Sandy, MD;  Location: [place] [facility];  Service: Cardiovascular;  Laterality: N/A;   ABDOMINAL AORTOGRAM W/LOWER EXTREMITY N/A 03/05/2023   Procedure: ABDOMINAL AORTOGRAM W/LOWER EXTREMITY;  Surgeon: Waynetta Sandy, MD;  Location: [place] [facility];  Service: Cardiovascular;  Laterality: N/A;   AMPUTATION Right 03/09/2023   Procedure: AMPUTATION ABOVE KNEE;  Surgeon: Waynetta Sandy, MD;  Location: [place];  Service: Vascular;  Laterality: Right;   COLONOSCOPY N/A 08/25/2020   Procedure: COLONOSCOPY;  Surgeon: Daneil Dolin, MD;  [facility];  Service: Endoscopy;  Laterality: N/A;  9:30   COLONOSCOPY     CYSTOSCOPY WITH LITHOLAPAXY N/A 05/18/2021   Procedure: CYSTOSCOPY WITH LITHOLAPAXY;  Surgeon: Ceasar Mons, MD;  Location: [facility];  Service: Urology;  Laterality: N/A;  ONLY NEEDS  30 MIN   FEMORAL-TIBIAL BYPASS GRAFT Right 12/12/2022   Procedure: RIGHT COMMON FEMORAL-POSTERIOR  TIBIAL ARTERY BYPASS WITH VEIN HARVESTING OF THE GREATER SAPHENOUS VEIN AND FEMORAL ENDARTERECTOMY WITH COMPOSITE GRAFT;  Surgeon: Waynetta Sandy, MD;  Location: [place];  Service: Vascular;  Laterality: Right;   POLYPECTOMY  08/25/2020   Procedure: POLYPECTOMY;  Surgeon: Daneil Dolin, MD;  [facility];  Service: Endoscopy;;   ROBOT ASSISTED LAPAROSCOPIC RADICAL PROSTATECTOMY  2011   TONSILLECTOMY  age 72   and adenoids removed   Social History:  reports that he quit smoking about 3 months ago. His smoking use included cigarettes. He has a 5.25 pack-year smoking history. He has never used smokeless tobacco. He reports that he does not drink alcohol and does not use drugs.  Family / Support Systems Marital Status: Single Patient Roles: Other (Comment) (sibling and employee) Other Supports: Ann-sister [id number] Anticipated Caregiver: Sister, friends and church members Ability/Limitations of Caregiver: Does not have 24/7 care and will need to be mod/i level Caregiver Availability: Intermittent Family Dynamics: Close with sister who is very supportive and a retired Therapist, sports. She is involved in his medical appointments and follow up. 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 He was working, living alone and driving prior to admission. He hopes to recover and regain his independence again. Recent Psychosocial Issues: other health issues Psychiatric History: Has been struggling with the loss of his leg and the place he came to with this. It started in 11/2022 with a small sore from a walking boot and progressed to this. Have placed him on the neuro-psych list to be seen this week and if available to see again next week. Substance Abuse History: No issues  Patient / Family Perceptions, Expectations & Goals Pt/Family understanding of illness & functional limitations: Pt and sister are able to explain his amputation and issues surrounding this. Both do talk with the MD and feel have a good understanding of his treatment plan moving forward. Premorbid pt/family roles/activities: Brother, friend, co-worker, church member, etc Anticipated changes in roles/activities/participation: resume Pt/family expectations/goals: Pt states: " I hope to do well and recover I have always been independent up till now."  [employer]: None Premorbid Home Care/DME Agencies: Other (Comment) (rw, cane 3 in 1) Transportation available at discharge: self and now will rely upon sister and others Is the patient able to respond to transportation needs?: Yes In the past 12 months, has lack of transportation kept you from medical appointments or from getting medications?: No In the past 12 months, has lack of transportation kept you from meetings, work, or from getting things needed for daily living?: No Resource  referrals recommended: Neuropsychology  Discharge Planning Living Arrangements: Alone Support Systems: Other relatives, Water engineer, Social worker community Type of Residence: Private residence Insurance Resources: Multimedia programmer (specify) Primary school teacher) Financial Resources: Fish farm manager, Employment Financial Screen Referred: No Living Expenses: Own Money Management: Patient Does the patient have any problems obtaining your medications?: No Home Management: self Patient/Family Preliminary Plans: Going home alone with others checking in on him. Will need to be mode/i to be safe home alone and manage. His sister is involved and will do waht she can for him. Aware being evaluated today and goals being set for his stay Care Coordinator Barriers to Discharge: Decreased caregiver support, Lack of/limited family support Care Coordinator Anticipated Follow Up Needs: HH/OP  Clinical Impression Pleasant gentleman who is motivated to do well but will need to reach mod/I level to return home alone with others checking on him. Being evaluated today and goals being set. Sister coming this afternoon will go back y and introduce myself to her and see if any questions-per request of pt. Pt on neuro-psych list to be seen this week  Elease Hashimoto 03/14/2023, 9:56 AM

## 2023-03-15 DIAGNOSIS — F4323 Adjustment disorder with mixed anxiety and depressed mood: Secondary | ICD-10-CM

## 2023-03-15 LAB — OCCULT BLOOD X 1 CARD TO LAB, STOOL: Fecal Occult Bld: NEGATIVE

## 2023-03-15 MED ORDER — SENNOSIDES-DOCUSATE SODIUM 8.6-50 MG PO TABS
2.0000 | ORAL_TABLET | Freq: Every day | ORAL | Status: DC
  Administered 2023-03-15 – 2023-03-21 (×7): 2 via ORAL
  Filled 2023-03-15 (×7): qty 2

## 2023-03-15 NOTE — Progress Notes (Signed)
PROGRESS NOTE   Subjective/Complaints:  Pt reports Oxycontin has really reduced pain- feels like is 5/10- but notes has difficulty numbering pain- but much more tolerable- not hurting as much Also rubbing RLE- "Nub" he calls it- when has phantom pain- helps- also notes that doing ROM/stretching of RLE and it's not as hard/painful to do.   LBM 2 days ago- tried, but thinks will go today- concerned about having incontinent episode- asked to decrease bowel meds- on nothing scheduled.   Tired from yesterday, but no other issues.   ROS:  Pt denies SOB, abd pain, CP, N/V/C/D, and vision changes  Objective:   No results found. Recent Labs    03/13/23 0209 03/14/23 0549  WBC 9.4 9.2  HGB 8.7* 9.1*  HCT 27.1* 29.1*  PLT 385 414*   Recent Labs    03/13/23 0209 03/14/23 0549  NA 135 136  K 4.5 4.6  CL 102 104  CO2 21* 22  GLUCOSE 115* 111*  BUN 36* 41*  CREATININE 1.87* 1.88*  CALCIUM 8.9 9.1    Intake/Output Summary (Last 24 hours) at 03/15/2023 0840 Last data filed at 03/15/2023 0500 Gross per 24 hour  Intake 240 ml  Output 900 ml  Net -660 ml        Physical Exam: Vital Signs Blood pressure (!) 110/58, pulse 70, temperature 98.2 F (36.8 C), temperature source Oral, resp. rate 16, height 6' (1.829 m), weight 89.5 kg, SpO2 99 %.    General: awake, alert, appropriate, sititng up at bedside ; NAD HENT: conjugate gaze; oropharynx moist CV: regular rate; no JVD Pulmonary: CTA B/L; no W/R/R- good air movement GI: soft, NT, ND, (+)BS- normoactive Psychiatric: appropriate- brighter affect, but still flat Neurological: Ox3  Musculoskeletal:     Cervical back: Neck supple. No tenderness.     Comments: R-AKA with multiple ecchymotic areas--question early blister as well as blister? inner thigh covered with foam dressing. However except ecchymoses, staples intact, doesn't have more than moderate swelling and very  TTP as expected- looks the same- maybe less swelling slightly- pt rubbing more- before wouldn't touch leg UE strength 5-/5 throughout B/L LLE- 5-/5 in HF, KE, KF, DF and PF RLE- NT per pt request    Skin:    General: Skin is warm and dry.     Comments: IV R forearm looks OK Inner groin/thigh dressing from prior bypass R AKA incision per the MS  Neurological:     Mental Status: He is alert and oriented to person, place, and time.     Comments: Intact to light touch in LLE and RLE/AKA  Assessment/Plan: 1. Functional deficits which require 3+ hours per day of interdisciplinary therapy in a comprehensive inpatient rehab setting. Physiatrist is providing close team supervision and 24 hour management of active medical problems listed below. Physiatrist and rehab team continue to assess barriers to discharge/monitor patient progress toward functional and medical goals  Care Tool:  Bathing    Body parts bathed by patient: Right arm, Left arm, Chest, Abdomen, Front perineal area, Right upper leg, Left upper leg, Face   Body parts bathed by helper: Buttocks, Left lower leg Body parts n/a: Right lower  leg   Bathing assist Assist Level: Minimal Assistance - Patient > 75%     Upper Body Dressing/Undressing Upper body dressing   What is the patient wearing?: Pull over shirt    Upper body assist Assist Level: Set up assist    Lower Body Dressing/Undressing Lower body dressing      What is the patient wearing?: Incontinence brief, Pants     Lower body assist Assist for lower body dressing: Minimal Assistance - Patient > 75%     Toileting Toileting    Toileting assist Assist for toileting: Moderate Assistance - Patient 50 - 74%     Transfers Chair/bed transfer  Transfers assist     Chair/bed transfer assist level: Minimal Assistance - Patient > 75%     Locomotion Ambulation   Ambulation assist   Ambulation activity did not occur: Safety/medical concerns (faigue,  AKA)          Walk 10 feet activity   Assist  Walk 10 feet activity did not occur: Safety/medical concerns        Walk 50 feet activity   Assist Walk 50 feet with 2 turns activity did not occur: Safety/medical concerns         Walk 150 feet activity   Assist Walk 150 feet activity did not occur: Safety/medical concerns         Walk 10 feet on uneven surface  activity   Assist Walk 10 feet on uneven surfaces activity did not occur: Safety/medical concerns (faitgue, AKA)         Wheelchair     Assist Is the patient using a wheelchair?: Yes Type of Wheelchair: Manual    Wheelchair assist level: Supervision/Verbal cueing Max wheelchair distance: >150 ft    Wheelchair 50 feet with 2 turns activity    Assist        Assist Level: Supervision/Verbal cueing   Wheelchair 150 feet activity     Assist      Assist Level: Supervision/Verbal cueing   Blood pressure (!) 110/58, pulse 70, temperature 98.2 F (36.8 C), temperature source Oral, resp. rate 16, height 6' (1.829 m), weight 89.5 kg, SpO2 99 %.  Medical Problem List and Plan: 1. Functional deficits secondary to R AKA due to thrombosis of graft and lack of flow per ABI's.              -patient may  shower- cover incision             -ELOS/Goals: ~ 2 weeks supervision at w/c level  Con't CIR PT and OT- is rubbing RLE more, so hopeful in his coping with new R AKA 2.  Antithrombotics: -DVT/anticoagulation:  Pharmaceutical: Lovenox             -antiplatelet therapy: ASA.  3. Pain Management: Oxycodone prn. D/c dilaudid as not used for past 3 days             --add oxycontin for  more consistent relief. Flexeril prn for spasms. Pain has been uncontrolled, will attempt to get better control with long acting, and treating phantom pain             --reporting phantom pain also--may need to resume gabapentin.   3/20- will add Gabapentin 300 mg QHS  3/21- don't want to increase gabapentin due  to Cr of 1.88- but encouraged touch of R AKA for phantom pain- pain much better controlled on Scheduled long acting.  4. Mood/Behavior/Sleep: LCSW to follow for evaluation and support.             -  antipsychotic agents: N/A.  --Continue ativan prn for anxiety. Consulted Dr. Sima Matas to help with coping/anxiety/limb loss. Doesn't want to look at AKA; also lost long term GF since hospital admission- needs help coping/with grief             --Trazodone added for insomnia prn.  5. Neuropsych/cognition: This patient is capable of making decisions on his  own behalf.  3/21- needs to see Neuropsych- was seen yesterday- note pending 6. Skin/Wound Care: Monitor wound for healing. Monitor for blistering.   7. Fluids/Electrolytes/Nutrition: Monitor I/O. Check CMET in am.              --Protein supplements added to promote wound healing.  8.  Leucocytosis/Fevers: WBC 18.4-->9.4/ due to RLE cellulitis now s/p AKA.              --Completed ceftriaxone X 5 days  --Resolving and will continue to monitor.  9. HTN: Monitor BP TID. Continue metoprolol --continue to hold prinzide due to AKI and as BP controlled.   3/21- BP controlled in spite of holding BP med- con't to monitor 10. Acute on chronic renal failure: BUN/SCr trending down from 127/5.1-->38/1.87 --continue sodium bicarb as CO2  still elevated but improved 13-->21  3/20- Cr stable at 1.88- BUN very slightly higher at 41 from 36- and CO2 better at 22- con't to monitor 3/21- will recheck in AM- might need some more IVFs- will push fluids.  11. Acute on chronic anemia: Improved post transfusion 3 total units             --add iron supplement. Will con't to monitor  3/20- stable today- 9.1- better today- con't to monitor 13. Low protein stores: Add juven.  14. Abnormal LFTs: Continue to monitor. Recheck in am.  15. Elevated BS: Question stress/infection related. Will check Hgb A1c  3/21- HbA1c 5.8- will stop CBGs.   16. GERD: Continue PPI (was better  with Nexium) 17. Concern for bowel incontinence  3/21- not on any scheduled pain meds- will likely get backed up, but will encourage to be on scheduled Laxative   I spent a total of  35  minutes on total care today- >50% coordination of care- due to  D/w pt about issues- review of chart and labs- and d/w nursing about plan         LOS: 2 days A FACE TO FACE EVALUATION WAS PERFORMED  Shanty Ginty 03/15/2023, 8:40 AM

## 2023-03-15 NOTE — Progress Notes (Signed)
Physical Therapy Session Note  Patient Details  Name: [name] MRN: [id number] Date of Birth: 04-Dec-1951  Today's Date: 03/15/2023 PT Individual Time: 1st Treatment Session: [phone]; 2nd Treatment Session: [id number]  PT Individual Time Calculation (min): 75 min; 45 min  Short Term Goals: Week 1:  PT Short Term Goal 1 (Week 1): STG=LTG due to ELOS  Skilled Therapeutic Interventions/Progress Updates:  1st Treatment Session- Patient greeted supine in bed with physician present and agreeable to PT treatment session- Patient reports 5/10 pain this morning without taking morning medication and stated he would like to wait until after therapy to take his morning medications. While supine in bed, patient rolled L and R with supv while therapist doffed soiled brief and donned clean brief. While supine, patient threaded pants with set-up assistance. Patient transitioned from supine to sitting EOB with use of bed rail and supv- While sitting EOB, patient donned clean shirt and sock/shoe with set-up assistance. Patient stood from EOB with RW and CGA for safety and then performed stand pivot transfer to wheelchair with CGA and good recall of appropriate sequencing. Patient wheeled to the sink where he brushed his teeth, washed his face and put in his dentures independently. Patient propelled manual wheelchair to rehab gym from his room (>150') with B UE and supv.   Patient stood from wheelchair multiple times for gait training with the use of a RW and CGA for safety- Patient demonstrated appropriate recall of hand placement and overall sequencing.   Patient gait trained x14' and x41' with RW and CGA with therapist bringing wheelchair behind patient for safety. VC for increased L foot clearance and using B UE to assist with good improvements noted, however fatigues quickly.   Patient wheeled back to his room with B UE and supv- Patient performed sit/stand and stand pivot transfer with RW and CGA  from wheelchair to bedside recliner. Patient left sitting upright in bedside recliner with posey belt on, call bell within reach and all needs met.    2nd Treatment Session- Patient greeted sitting upright in bedside recliner in room with cousin present and agreeable to PT treatment session. Patient denied pain this treatment session, however did endorse fatigue. Patient stood from recliner with RW and CGA and then was able to pivot to wheelchair with CGA and VC for stepping all the way back until he feels the wheelchair on the back of his L LE. Patient propelled manual wheelchair from his room to rehab gym with distant supv. Patient educated on how to manage wheelchair parts with good carryover from morning treatment session.   Patient performed x5 sit/stands with RW and CGA/MinA for initiating standing- VC for increased anterior weight shift throughout stand with good improvements noted, however fatigued quickly. Increased time required to complete secondary to fatigue. Extended seated rest break required after activity.   Patient performed UE therex in order to increase B UE strength for improved functional mobility- B chest press with 5# bar, 3 x 10  B overhead press with 5# bar, 3 x 10 B bicep curls with 5# bar, 3 x 10  Provided education regarding desensitization- Tapping and rubbing residual limb which patient was agreeable to and state he performed yesterday.   Patient wheeled back to his room and performed sit/stand and stand pivot transfer from wheelchair to bedside recliner with CGA/MinA- Patient left sitting upright in bedside recliner with call bell within reach, posey belt on and all needs met    Therapy Documentation Precautions:  Precautions  Precautions: Fall Precaution Comments: phantom limb pain RLE Restrictions Weight Bearing Restrictions: Yes RLE Weight Bearing: Non weight bearing   Therapy/Group: Individual Therapy  Faigy Stretch 03/15/2023, 7:58 AM

## 2023-03-15 NOTE — Progress Notes (Signed)
Occupational Therapy Session Note  Patient Details  Name: [name] MRN: [id number] Date of Birth: [birth year]  Today's Date: 03/15/2023 OT Individual Time: 1415-1530 OT Individual Time Calculation (min): 75 min    Short Term Goals: Week 1:  OT Short Term Goal 1 (Week 1): STGs=LTGs due to patient's length of stay.  Skilled Therapeutic Interventions/Progress Updates:   Pt seated in recliner upon OT arrival for 1st time meeting. Pt reported "I am having a better day coming to terms with things". Pt reported 6/10 R residual limb pain with meds given and pt able to reposition according to needs. Pt requested use of urinal and was able to move from sit to stand with OT assisting to position urnal with min A overall with pt managing LB pull up garments in recliner some and standing some. Pt was able to void and [place] arrived and was able to document voiding. Pt able to perform hygiene in sitting and OT donned clean incontinent brief with mod a. OT suggested pt try wearing his usual briefs and [place] men's incontinence pad in the next few days to mirror previous status and pt agreed. Pt was open to leaving room w/c and accessing outdoors. Pt performed SPT to/from w/c from recliner with RW with min A. Excellent safety with steps of transfer solid. Pt self propelled 6 intervals of ~ 50 ft with occ min cues for w/c operation on turns. OT assisted into and out of elevators for safety. Was able to navigate both cement and tile terrains. Once back in room, pt remained in recliner with chair alarm set, needs and nurse call button in place.   Pain: see above, reports decrease in pain with distraction and pleasant weather outdoors this day.  Therapy Documentation Precautions:  Precautions Precautions: Fall Precaution Comments: phantom limb pain RLE Restrictions Weight Bearing Restrictions: Yes RLE Weight Bearing: Non weight bearing    Therapy/Group: Individual Therapy  [name] 03/15/2023, 7:51 AM

## 2023-03-15 NOTE — Progress Notes (Signed)
Patient ID: [name], male   DOB: 04/02/1951, 72 y.o.   MRN: [id number]  Confirmed with sister she will be assisting pt when first goes home. She is a retired Therapist, sports and is very involved in pt's medical care/appointment

## 2023-03-15 NOTE — Consult Note (Signed)
Neuropsychological Consultation [facility]   Patient:   [name]   DOB:   [birth year]  MR Number:  [id number]  Location:  [facility] [id number] [place] [address] Dept: [place]: [phone]           Date of Service:   03/13/2020  Start Time:   1 PM End Time:   2 PM  Provider/Observer:  Ilean Skill, Psy.D.       Clinical Neuropsychologist       Billing Code/Service: [id number]  Reason for Service:    [name] is a 72 year old male referred for neuropsychological consultation due to adjustment and coping issues following recent right-AKA.  Patient has a past medical history including hypertension, prostate cancer, GERD, chronic kidney disease, PAD with previous vascular intervention/bypass due to critical limb ischemia.  Patient was admitted on 03/05/2023 for R-AKA after limb was not felt to be salvageable.  Patient had great difficulty ultimately agreeing to amputation and has had difficulties coping and adjusting to loss of limb.  The patient was oriented today but dysphoric.  Patient reports that with time he is coming to be more accepting of his amputation but has not been able to progress forward and his thoughts about potential addition of prostatic.  Patient describes some mild to moderate phantom limb syndrome symptoms but does not describe them as severe or impeding his rehabilitative efforts.  Patient reports that his mood has improved to some degree but continues to be dysmorphic and worried.  Patient's sister was present today and for the most part was not involved in discussions but towards the end of the visit today expressed some concerns and frustrations but was unclear whether these were frustrations about issues on the inpatient rehab unit or other units.  HPI for the current admission:    HPI: Gregrory R. Lewis is a 72 year  old male with history of HTN, prostate CA, GERD, CKD, PAD with right ankle ulcer s/p Fem-tib bypass for critical limb ischemia who had failure of graft with post op edema, significant pain and extensive wounds. He was admitted on 03/05/23 for R-AKA by Dr. Donzetta Matters. Limb not felt to be salvageable and he was admitted on 03/11 for attempt at angiography which showed chronically thrombosed anterior/posterior tibial arteries. Post procedure with drop in Hgb to 5.9 and was transfused with 2 units PRBC. He continued to have difficulty processing limb loss with multiple conversations. He developed acute on chronic renal failure 03/12 and was found to have moderate right and mild left hydronephrosis therefore foley placed.  On 03/14, he developed fever- 101F,  tachycardia, leucocytosis with WBC- 18.4 and noted to have elevated CK-[id number] (question rhabdomyolysis).  He was started on IV ceftriaxone for RLE cellulitis and underwent R-AKA on 03/15. ABLA with hgb drop to 7.4 treated with one unit PRBC on 03/17 and completes antibiotic course today. As AKI resolving, foley was discontinued and passed voiding trial. PT/OT has been working with patient who continues to be limited by pain, fear, anxiety and weakness.  He has had decline in functional status and CIR recommended by rehab team.   Medical History:   Past Medical History:  Diagnosis Date   Family history of metabolic acidosis with increased anion gap 03/05/2023   GERD (gastroesophageal reflux disease)    History of kidney stones    Hyperlipidemia    Hypertension    Prostate cancer ([name]) 2011  radiation june 2020   Umbilical hernia    Wears dentures    full   Wears glasses    Wears partial dentures    lower         Patient Active Problem List   Diagnosis Date Noted   Adjustment disorder with mixed anxiety and depressed mood 03/15/2023   Above knee amputation of right lower extremity ([place]) 03/13/2023   CKD (chronic kidney disease) stage 3, GFR 30-59  ml/min ([place]) 03/13/2023   Cellulitis of right lower extremity 03/09/2023   Fever 03/09/2023   Non-traumatic rhabdomyolysis 03/09/2023   Bandemia 03/09/2023   Acute kidney injury superimposed on chronic kidney disease ([name]) 03/05/2023   Hyperkalemia 03/05/2023   Normocytic anemia 03/05/2023   History of prostate cancer 03/05/2023   Essential hypertension 0000000   Metabolic acidosis, increased anion gap 03/05/2023   Critical limb ischemia of right lower extremity ([name]) 12/12/2022   Malignant neoplasm of prostate ([name]) 03/18/2019    Behavioral Observation/Mental Status:   [name]  presents as a 72 y.o.-year-old Right handed African American Male who appeared his stated age. his dress was Appropriate and he was Well Groomed and his manners were Appropriate to the situation.  his participation was indicative of Appropriate and Redirectable behaviors.  There were physical disabilities noted.  he displayed an appropriate level of cooperation and motivation.    Interactions:    Active Appropriate and Redirectable  Attention:   abnormal and attention span appeared shorter than expected for age  Memory:   within normal limits; recent and remote memory intact  Visuo-spatial:   not examined  Speech (Volume):  low  Speech:   normal; normal  Thought Process:  Coherent and Circumstantial  Concrete and Organized  Though Content:  WNL; not suicidal and not homicidal  Orientation:   person, place, and time/date  Judgment:   Fair  Planning:   Poor  Affect:    Depressed  Mood:    Dysphoric  Insight:   Fair  Intelligence:   normal  Psychiatric History:  No prior psychiatric history noted  Family Med/Psych History:  Family History  Problem Relation Age of Onset   Gastric cancer Mother    Prostate cancer Father    Breast cancer Neg Hx    Pancreatic cancer Neg Hx     Risk of Suicide/Violence: Patient denies any suicidal or homicidal  ideation.    Impression/DX:  [name] is a 72 year old male referred for neuropsychological consultation due to adjustment and coping issues following recent right-AKA.  Patient has a past medical history including hypertension, prostate cancer, GERD, chronic kidney disease, PAD with previous vascular intervention/bypass due to critical limb ischemia.  Patient was admitted on 03/05/2023 for R-AKA after limb was not felt to be salvageable.  Patient had great difficulty ultimately agreeing to amputation and has had difficulties coping and adjusting to loss of limb.  The patient was oriented today but dysphoric.  Patient reports that with time he is coming to be more accepting of his amputation but has not been able to progress forward and his thoughts about potential addition of prostatic.  Patient describes some mild to moderate phantom limb syndrome symptoms but does not describe them as severe or impeding his rehabilitative efforts.  Patient reports that his mood has improved to some degree but continues to be dysmorphic and worried.  Patient's sister was present today and for the most part was not involved in discussions but towards the end of the  visit today expressed some concerns and frustrations but was unclear whether these were frustrations about issues on the inpatient rehab unit or other units.  Disposition/Plan:  Today we worked on coping and adjustment issues with recent above-the-knee amputation/right          Electronically Signed   _______________________ Ilean Skill, Psy.D. Clinical Neuropsychologist

## 2023-03-16 LAB — BASIC METABOLIC PANEL
Anion gap: 9 (ref 5–15)
BUN: 57 mg/dL — ABNORMAL HIGH (ref 8–23)
CO2: 23 mmol/L (ref 22–32)
Calcium: 9 mg/dL (ref 8.9–10.3)
Chloride: 99 mmol/L (ref 98–111)
Creatinine, Ser: 1.77 mg/dL — ABNORMAL HIGH (ref 0.61–1.24)
GFR, Estimated: 40 mL/min — ABNORMAL LOW (ref >60)
Glucose, Bld: 115 mg/dL — ABNORMAL HIGH (ref 70–99)
Potassium: 4.7 mmol/L (ref 3.5–5.1)
Sodium: 131 mmol/L — ABNORMAL LOW (ref 135–145)

## 2023-03-16 MED ORDER — SODIUM CHLORIDE 0.9 % IV SOLN: INTRAVENOUS | Status: AC

## 2023-03-16 NOTE — IPOC Note (Signed)
Overall Plan of Care [facility]) Patient Details Name: [name] MRN: [id number] DOB: 03-22-1951  Admitting Diagnosis: Above knee amputation of right lower extremity [facility])  Hospital Problems: Principal Problem:   Above knee amputation of right lower extremity ([place]) Active Problems:   CKD (chronic kidney disease) stage 3, GFR 30-59 ml/min ([facility])   Adjustment disorder with mixed anxiety and depressed mood     Functional Problem List: Nursing Bladder, Bowel, Endurance, Medication Management, Nutrition, Pain, Safety, Skin Integrity  PT Balance, Pain, Endurance, Motor  OT Balance, Endurance, Pain  SLP    TR         Basic ADL's: OT Bathing, Dressing, Toileting     Advanced  ADL's: OT Simple Meal Preparation, Light Housekeeping     Transfers: PT Bed Mobility, Bed to Chair, Teacher, early years/pre, Tub/Shower     Locomotion: PT Ambulation, Wheelchair Mobility     Additional Impairments: OT    SLP        TR      Anticipated Outcomes Item Anticipated Outcome  Self Feeding    Swallowing      Basic self-care  Mod I WC level  Toileting  Mod I WC level   Bathroom Transfers Mod I WC level  Bowel/Bladder  patient will be continent of bowel and bladder with min assist  Transfers  Mod I  Locomotion  CGA  Communication     Cognition     Pain  pain will be less than or equal to 4/10 with medication  Safety/Judgment  patient will be free from falls/injury and displaying sound safety judgement   Therapy Plan: PT Intensity: Minimum of 1-2 x/day ,45 to 90 minutes PT Frequency: 5 out of 7 days PT Duration Estimated Length of Stay: 7-10 days OT Intensity: Minimum of 1-2 x/day, 45 to 90 minutes OT Frequency: 5 out of 7 days OT Duration/Estimated Length of Stay: 7-10 days     Team Interventions: Nursing Interventions Bladder Management, Bowel Management, Patient/Family Education, Pain Management, Medication Management, Skin Care/Wound Management, Discharge Planning  PT  interventions Ambulation/gait training, Discharge planning, DME/adaptive equipment instruction, Functional mobility training, Pain management, Psychosocial support, Therapeutic Activities, UE/LE Strength taining/ROM, Training and development officer, Community reintegration, Disease management/prevention, Neuromuscular re-education, Barrister's clerk education, Therapeutic Exercise, UE/LE Coordination activities, Wheelchair propulsion/positioning  OT Interventions Training and development officer, Academic librarian, Discharge planning, DME/adaptive equipment instruction, Functional mobility training, Pain management, Patient/family education, Psychosocial support, Self Care/advanced ADL retraining, Therapeutic Activities, Therapeutic Exercise, UE/LE Strength taining/ROM, Wheelchair propulsion/positioning  SLP Interventions    TR Interventions    SW/CM Interventions Discharge Planning, Psychosocial Support, Patient/Family Education   Barriers to Discharge MD  Medical stability, Home enviroment access/loayout, Wound care, Lack of/limited family support, Weight bearing restrictions, and AKI on CKD  Nursing Lack of/limited family support, Home environment access/layout, Weight bearing restrictions 1 level 2 ste bil rail solo; friend and sister to assist prn  PT Decreased caregiver support, Home environment access/layout house, 1 level, ramp, sister able to proivde 24/7, bathrooms are not w/c accessible  OT Decreased caregiver support, Home environment access/layout, Incontinence, Lack of/limited family support    SLP      SW Decreased caregiver support, Lack of/limited family support     Team Discharge Planning: Destination: PT-Home ,OT- Home , SLP-  Projected Follow-up: PT-Home health PT, OT-  Home health OT, SLP-  Projected Equipment Needs: PT-Wheelchair (measurements), Other (comment), OT- To be determined, SLP-  Equipment Details: PT-w/c 18 x 18, OT-  Patient/family involved in discharge planning:  PT- Patient, Family member/caregiver,  OT-Patient, SLP-   MD ELOS: 7-10 days Medical Rehab Prognosis:  Good Assessment: The patient has been admitted for CIR therapies with the diagnosis of  R AKA. The team will be addressing functional mobility, strength, stamina, balance, safety, adaptive techniques and equipment, self-care, bowel and bladder mgt, patient and caregiver education, . Goals have been set at mod I to [place]. Anticipated discharge destination is home.        See Team Conference Notes for weekly updates to the plan of care

## 2023-03-16 NOTE — Progress Notes (Signed)
Occupational Therapy Session Note  Patient Details  Name: [name] MRN: [id number] Date of Birth: [birth year]  Today's Date: 03/16/2023 OT Individual Time: 0933-1030 OT Individual Time Calculation (min): 57 min    Short Term Goals: Week 1:  OT Short Term Goal 1 (Week 1): STGs=LTGs due to patient's length of stay.  Skilled Therapeutic Interventions/Progress Updates:     Pt received sitting EOB with nursing staff present in room finishing up providing care. Pt presenting to be mildly upset upon OT arrival d/t not receiving a longer break between therapy session. Engaged Pt in light hearted conversation to boost moral with noted improvement. Discussed plans for shower today with Pt providing opportunity to shower this AM or after lunch with Pt preferring to complete afternoon shower. Pt reporting anxiety concerning shower with staples and incision on RLE with OT providing education on process for shower and strategies for covering incision with Pt reporting increased confidence following. Engaged Pt in discussion concerning home set-up and d/c planning. Pt reporting he has a walk-in shower at home, however he feels as though his shower is not very accessible with the bathroom being very small. Discussed strategies for completing transfers in small walk-in shower and shower chair use with Pt receptive to education. Pt propelled wc into bathroom for Further bathroom safety education. Pt informed on need to remove all mats from bathroom and provided strategies for shower curtain management. OT collaborated with Pt to problem solve safety strategy and shower set-up to increase Pt safety at home. Set-up bathroom with shower bench positioned against side wall. Provided Pt demonstration of stand pivot transfer using RW and strategy for hopping over shower ledge. Provided opportunity for practice with Pt able to complete transfer wc<> tub bench using RW with CGA and min cueing for technique. Pt  propelled wc back to room and completed stand pivot transfer wc>recliner using RW CGA. Pt recalling hand placement and technique for sit<>stands and stand pivot transfers with min questioning cues this session demonstrating improved carry over between therapy session. Pt was left resting in recliner with seat belt alarm on, call bell in reach, and all needs met.   Therapy Documentation Precautions:  Precautions Precautions: Fall Precaution Comments: phantom limb pain RLE Restrictions Weight Bearing Restrictions: Yes RLE Weight Bearing: Non weight bearing General:   Vital Signs: Therapy Vitals Temp: 98.5 F (36.9 C) Temp Source: Oral Pulse Rate: 79 Resp: 16 BP: 101/61 Patient Position (if appropriate): Lying Oxygen Therapy SpO2: 97 % O2 Device: Room Air    Therapy/Group: Individual Therapy  Janey Genta 03/16/2023, 8:01 AM

## 2023-03-16 NOTE — Progress Notes (Signed)
Occupational Therapy Session Note  Patient Details  Name: [name] MRN: [id number] Date of Birth: April 10, 1951  Today's Date: 03/16/2023 OT Individual Time: 1300-1415 OT Individual Time Calculation (min): 75 min    Short Term Goals: Week 1:  OT Short Term Goal 1 (Week 1): STGs=LTGs due to patient's length of stay.  Skilled Therapeutic Interventions/Progress Updates:   Pt recliner level upon OT arrival. Was taken off continuous IV fluids d/t IV pump malfunction this OT able to provide shower retraining. OT provided waterproofing IV and R residual limb prior to transfers. Pt performed SPT from recliner to w/c, w/c to commode over toilet to doff clothing then to and from TTB in stall shower with CGA. Pt completes all transfers with safe and step by step method. Pt issued and trained in [place] sponge for L LE bathing. Pt completed UB bathing with set up and L LE bathing with set up with sponge but min A for buttocks. Pt preferred to don incontinence brief at EOB, pull over shirt with indep and transfer with CGA using RW. Pt then placed back on IV by nursing and pt rested in bed with no assist for EOB to supine. Left pt with bed exit active, needs and nurse call button in reach.   Pain: 5/10 R residual limb with meds taken and warm shower with repositioning relief reported.    Therapy Documentation Precautions:  Precautions Precautions: Fall Precaution Comments: phantom limb pain RLE Restrictions Weight Bearing Restrictions: Yes RLE Weight Bearing: Non weight bearing   Therapy/Group: Individual Therapy  [name] 03/16/2023, 7:37 AM

## 2023-03-16 NOTE — Progress Notes (Signed)
Physical Therapy Session Note  Patient Details  Name: [name] MRN: [id number] Date of Birth: Nov 20, 1951  Today's Date: 03/16/2023 PT Individual Time: 0833-0930 PT Individual Time Calculation (min): 57 min   Short Term Goals: Week 1:  PT Short Term Goal 1 (Week 1): STG=LTG due to ELOS  Skilled Therapeutic Interventions/Progress Updates:    Pt presents in bed. Reporting difficulty with keeping track of the days while in the hospital, so printed off a calendar to keep on his wall and pt appreciative. Bed mobility with supervision to come to EOB worked on donning of pants with supervision for dynamic sitting balance. Assisted with sock and shoe for time management. Min assist with RW for sit > stand with functional dynamic standing balance with CGA. Initial sit > stand had mild LOB due to posterior lean and feeling "wobby" but denies dizziness. Sat down and repeated nad had no issues. Transferred with RW to w/c with CGA. Performed hygiene at sink with set up assistance. Mod I w/c propulsion in hallway to and from therapy gym for functional strengthening, mobility training and overall endurance. Engaged in transfer training for squat pivot technique in discussion of household options if using combination of w/c and RW. Talked through and demonstrated sequencing with pt return demonstration and performed with CGA overall for transfer in each direction. NMR for balance retraining with RW with 1 UE during game of horseshoes and reaching outside BOS to both directions x 2 trials with CGA for balance. Performed gait training with RW with focus on technique and scapular depression to promote increased foot clearance as well x 38'. End of session IV arrived to place IV for fluids, so performed transfer back to bed woth RW with CGA and left in care of IV team.   Therapy Documentation Precautions:  Precautions Precautions: Fall Precaution Comments: phantom limb pain RLE Restrictions Weight Bearing  Restrictions: Yes RLE Weight Bearing: Non weight bearing    Pain:  Does not report pain.    Therapy/Group: Individual Therapy  Canary Brim Ivory Broad, PT, DPT, CBIS  03/16/2023, 9:45 AM

## 2023-03-16 NOTE — Progress Notes (Signed)
PROGRESS NOTE   Subjective/Complaints:  Pt reports no complaints-   Pain doing OK- is tolerable LBM yesterday ROS:  Pt denies SOB, abd pain, CP, N/V/C/D, and vision changes   Objective:   No results found. Recent Labs    03/14/23 0549  WBC 9.2  HGB 9.1*  HCT 29.1*  PLT 414*   Recent Labs    03/14/23 0549 03/16/23 0645  NA 136 131*  K 4.6 4.7  CL 104 99  CO2 22 23  GLUCOSE 111* 115*  BUN 41* 57*  CREATININE 1.88* 1.77*  CALCIUM 9.1 9.0    Intake/Output Summary (Last 24 hours) at 03/16/2023 0842 Last data filed at 03/16/2023 0817 Gross per 24 hour  Intake 720 ml  Output 1200 ml  Net -480 ml        Physical Exam: Vital Signs Blood pressure 101/61, pulse 79, temperature 98.5 F (36.9 C), temperature source Oral, resp. rate 16, height 6' (1.829 m), weight 89.5 kg, SpO2 97 %.     General: awake, alert, appropriate, just starting to eat breakfast, NAD HENT: conjugate gaze; oropharynx moist CV: regular rate; no JVD Pulmonary: CTA B/L; no W/R/R- good air movement GI: soft, NT, ND, (+)BS Psychiatric: appropriate- slightly brighter affect Neurological: Ox3  Musculoskeletal:     Cervical back: Neck supple. No tenderness.     Comments: R-AKA with multiple ecchymotic areas--question early blister as well as blister? inner thigh covered with foam dressing. However except ecchymoses, staples intact, doesn't have more than moderate swelling and very TTP as expected- looks the same- maybe less swelling slightly- pt rubbing more- before wouldn't touch leg UE strength 5-/5 throughout B/L LLE- 5-/5 in HF, KE, KF, DF and PF RLE- NT per pt request    Skin:    General: Skin is warm and dry.     Comments: IV R forearm looks OK Inner groin/thigh dressing from prior bypass R AKA incision per the MS  Neurological:     Mental Status: He is alert and oriented to person, place, and time.     Comments: Intact to  light touch in LLE and RLE/AKA  Assessment/Plan: 1. Functional deficits which require 3+ hours per day of interdisciplinary therapy in a comprehensive inpatient rehab setting. Physiatrist is providing close team supervision and 24 hour management of active medical problems listed below. Physiatrist and rehab team continue to assess barriers to discharge/monitor patient progress toward functional and medical goals  Care Tool:  Bathing    Body parts bathed by patient: Right arm, Left arm, Chest, Abdomen, Front perineal area, Right upper leg, Left upper leg, Face   Body parts bathed by helper: Buttocks, Left lower leg Body parts n/a: Right lower leg   Bathing assist Assist Level: Minimal Assistance - Patient > 75%     Upper Body Dressing/Undressing Upper body dressing   What is the patient wearing?: Pull over shirt    Upper body assist Assist Level: Set up assist    Lower Body Dressing/Undressing Lower body dressing      What is the patient wearing?: Incontinence brief, Pants     Lower body assist Assist for lower body dressing: Minimal Assistance - Patient >  75%     Toileting Toileting    Toileting assist Assist for toileting: Moderate Assistance - Patient 50 - 74%     Transfers Chair/bed transfer  Transfers assist     Chair/bed transfer assist level: Minimal Assistance - Patient > 75%     Locomotion Ambulation   Ambulation assist   Ambulation activity did not occur: Safety/medical concerns (faigue, AKA)          Walk 10 feet activity   Assist  Walk 10 feet activity did not occur: Safety/medical concerns        Walk 50 feet activity   Assist Walk 50 feet with 2 turns activity did not occur: Safety/medical concerns         Walk 150 feet activity   Assist Walk 150 feet activity did not occur: Safety/medical concerns         Walk 10 feet on uneven surface  activity   Assist Walk 10 feet on uneven surfaces activity did not occur:  Safety/medical concerns (faitgue, AKA)         Wheelchair     Assist Is the patient using a wheelchair?: Yes Type of Wheelchair: Manual    Wheelchair assist level: Supervision/Verbal cueing Max wheelchair distance: >150 ft    Wheelchair 50 feet with 2 turns activity    Assist        Assist Level: Supervision/Verbal cueing   Wheelchair 150 feet activity     Assist      Assist Level: Supervision/Verbal cueing   Blood pressure 101/61, pulse 79, temperature 98.5 F (36.9 C), temperature source Oral, resp. rate 16, height 6' (1.829 m), weight 89.5 kg, SpO2 97 %.  Medical Problem List and Plan: 1. Functional deficits secondary to R AKA due to thrombosis of graft and lack of flow per ABI's.              -patient may  shower- cover incision             -ELOS/Goals: ~ 2 weeks supervision at w/c level  Con't CIR PT and OT 2.  Antithrombotics: -DVT/anticoagulation:  Pharmaceutical: Lovenox             -antiplatelet therapy: ASA.  3. Pain Management: Oxycodone prn. D/c dilaudid as not used for past 3 days             --add oxycontin for  more consistent relief. Flexeril prn for spasms. Pain has been uncontrolled, will attempt to get better control with long acting, and treating phantom pain             --reporting phantom pain also--may need to resume gabapentin.   3/20- will add Gabapentin 300 mg QHS  3/21- don't want to increase gabapentin due to Cr of 1.88- but encouraged touch of R AKA for phantom pain- pain much better controlled on Scheduled long acting.   3/22- pain doing better- tolerable 4. Mood/Behavior/Sleep: LCSW to follow for evaluation and support.             -antipsychotic agents: N/A.  --Continue ativan prn for anxiety. Consulted Dr. Sima Matas to help with coping/anxiety/limb loss. Doesn't want to look at AKA; also lost long term GF since hospital admission- needs help coping/with grief             --Trazodone added for insomnia prn.  5.  Neuropsych/cognition: This patient is capable of making decisions on his  own behalf.  3/22- has seen Neuropsych- doing better somewhat  6. Skin/Wound Care:  Monitor wound for healing. Monitor for blistering.   7. Fluids/Electrolytes/Nutrition: Monitor I/O. Check CMET in am.              --Protein supplements added to promote wound healing.  8.  Leucocytosis/Fevers: WBC 18.4-->9.4/ due to RLE cellulitis now s/p AKA.              --Completed ceftriaxone X 5 days  --Resolving and will continue to monitor.  9. HTN: Monitor BP TID. Continue metoprolol --continue to hold prinzide due to AKI and as BP controlled.   3/21- BP controlled in spite of holding BP med- con't to monitor 3/22- BP a little low- con't regimen 10. Acute on chronic renal failure: BUN/SCr trending down from 127/5.1-->38/1.87 --continue sodium bicarb as CO2  still elevated but improved 13-->21  3/20- Cr stable at 1.88- BUN very slightly higher at 41 from 36- and CO2 better at 22- con't to monitor 3/21- will recheck in AM- might need some more IVFs- will push fluids 3/22- Cr 1.77 and BUN 57- has continued to increase- was 36-40- will do IVFs to help with dehydration/Azotemia in addition to AKI. Will do IVFs 60cc/hour x 24 hours and recheck BMP in AM.   11. Acute on chronic anemia: Improved post transfusion 3 total units             --add iron supplement. Will con't to monitor  3/20- stable today- 9.1- better today- con't to monitor 13. Low protein stores: Add juven.  14. Abnormal LFTs: Continue to monitor. Recheck in am.  15. Elevated BS: Question stress/infection related. Will check Hgb A1c  3/21- HbA1c 5.8- will stop CBGs.   16. GERD: Continue PPI (was better with Nexium) 17. Concern for bowel incontinence  3/21- not on any scheduled pain meds- will likely get backed up, but will encourage to be on scheduled Laxative  3/22- LBM yesterday per pt- cannot find it documented, but maybe was with therapy?  I spent a total of 36    minutes on total care today- >50% coordination of care- due to  [facility] with nursing need for IVFs and BMP in AM- also IPOC       LOS: 3 days A FACE TO FACE EVALUATION WAS PERFORMED  [name] 03/16/2023, 8:42 AM

## 2023-03-17 DIAGNOSIS — N189 Chronic kidney disease, unspecified: Secondary | ICD-10-CM

## 2023-03-17 DIAGNOSIS — E871 Hypo-osmolality and hyponatremia: Secondary | ICD-10-CM

## 2023-03-17 DIAGNOSIS — N179 Acute kidney failure, unspecified: Secondary | ICD-10-CM

## 2023-03-17 DIAGNOSIS — I10 Essential (primary) hypertension: Secondary | ICD-10-CM

## 2023-03-17 DIAGNOSIS — M79604 Pain in right leg: Secondary | ICD-10-CM

## 2023-03-17 LAB — OCCULT BLOOD X 1 CARD TO LAB, STOOL: Fecal Occult Bld: NEGATIVE

## 2023-03-17 LAB — BASIC METABOLIC PANEL
Anion gap: 8 (ref 5–15)
BUN: 42 mg/dL — ABNORMAL HIGH (ref 8–23)
CO2: 26 mmol/L (ref 22–32)
Calcium: 8.9 mg/dL (ref 8.9–10.3)
Chloride: 99 mmol/L (ref 98–111)
Creatinine, Ser: 1.59 mg/dL — ABNORMAL HIGH (ref 0.61–1.24)
GFR, Estimated: 46 mL/min — ABNORMAL LOW (ref >60)
Glucose, Bld: 99 mg/dL (ref 70–99)
Potassium: 4.4 mmol/L (ref 3.5–5.1)
Sodium: 133 mmol/L — ABNORMAL LOW (ref 135–145)

## 2023-03-17 NOTE — Progress Notes (Signed)
PROGRESS NOTE   Subjective/Complaints:  He reports he had a bowel movement today.  Reports pain is under control, has not required as needed oxycodone a couple days.  He is still on IV fluids this morning.  No additional concerns or complaints.  ROS:  Pt denies SOB, abd pain, headache, CP, N/V/C/D, and vision changes   Objective:   No results found. No results for input(s): "WBC", "HGB", "HCT", "PLT" in the last 72 hours.  Recent Labs    03/16/23 0645 03/17/23 0646  NA 131* 133*  K 4.7 4.4  CL 99 99  CO2 23 26  GLUCOSE 115* 99  BUN 57* 42*  CREATININE 1.77* 1.59*  CALCIUM 9.0 8.9     Intake/Output Summary (Last 24 hours) at 03/17/2023 1724 Last data filed at 03/17/2023 1632 Gross per 24 hour  Intake 714 ml  Output 1875 ml  Net -1161 ml         Physical Exam: Vital Signs Blood pressure (!) 128/56, pulse 83, temperature 97.8 F (36.6 C), temperature source Oral, resp. rate 17, height 6' (1.829 m), weight 89.5 kg, SpO2 100 %.     General: awake, alert, appropriate, lying in bed, NAD HENT: conjugate gaze; oropharynx moist CV: regular rate; no JVD Pulmonary: CTA B/L; no W/R/R- good air movement GI: soft, NT, ND, (+)BS Psychiatric: appropriate-pleasant Neurological: Ox3  Musculoskeletal:     Cervical back: Neck supple. No tenderness.     Comments: R-AKA with multiple ecchymotic areas--question early blister as well as blister? inner thigh covered with foam dressing. However except ecchymoses, staples intact, doesn't have more than moderate swelling and very TTP as expected- looks the same- maybe less swelling slightly- pt rubbing more- before wouldn't touch leg UE strength 5-/5 throughout B/L LLE- 5-/5 in HF, KE, KF, DF and PF RLE- NT per pt request    Skin:    General: Skin is warm and dry.     Comments: IV R forearm looks OK Inner groin/thigh dressing from prior bypass R AKA incision per the MS   Neurological:     Mental Status: He is alert and oriented to person, place, and time.  Follows commands    Comments: Intact to light touch in LLE and RLE/AKA  Assessment/Plan: 1. Functional deficits which require 3+ hours per day of interdisciplinary therapy in a comprehensive inpatient rehab setting. Physiatrist is providing close team supervision and 24 hour management of active medical problems listed below. Physiatrist and rehab team continue to assess barriers to discharge/monitor patient progress toward functional and medical goals  Care Tool:  Bathing    Body parts bathed by patient: Right arm, Left arm, Chest, Abdomen, Front perineal area, Right upper leg, Left upper leg, Face, Left lower leg   Body parts bathed by helper: Buttocks Body parts n/a: Right lower leg   Bathing assist Assist Level: Minimal Assistance - Patient > 75%     Upper Body Dressing/Undressing Upper body dressing   What is the patient wearing?: Pull over shirt    Upper body assist Assist Level: Set up assist    Lower Body Dressing/Undressing Lower body dressing      What is the patient  wearing?: Incontinence brief, Pants     Lower body assist Assist for lower body dressing: Minimal Assistance - Patient > 75%     Toileting Toileting    Toileting assist Assist for toileting: Minimal Assistance - Patient > 75%     Transfers Chair/bed transfer  Transfers assist     Chair/bed transfer assist level: Contact Guard/Touching assist     Locomotion Ambulation   Ambulation assist   Ambulation activity did not occur: Safety/medical concerns (faigue, AKA)  Assist level: Contact Guard/Touching assist Assistive device: Walker-platform Max distance: 38'   Walk 10 feet activity   Assist  Walk 10 feet activity did not occur: Safety/medical concerns  Assist level: Contact Guard/Touching assist Assistive device: Walker-rolling   Walk 50 feet activity   Assist Walk 50 feet with 2  turns activity did not occur: Safety/medical concerns  Assist level: Contact Guard/Touching assist Assistive device: Walker-rolling    Walk 150 feet activity   Assist Walk 150 feet activity did not occur: Safety/medical concerns         Walk 10 feet on uneven surface  activity   Assist Walk 10 feet on uneven surfaces activity did not occur: Safety/medical concerns (faitgue, AKA)         Wheelchair     Assist Is the patient using a wheelchair?: Yes Type of Wheelchair: Manual    Wheelchair assist level: Independent Max wheelchair distance: >150 ft    Wheelchair 50 feet with 2 turns activity    Assist        Assist Level: Independent   Wheelchair 150 feet activity     Assist      Assist Level: Independent   Blood pressure (!) 128/56, pulse 83, temperature 97.8 F (36.6 C), temperature source Oral, resp. rate 17, height 6' (1.829 m), weight 89.5 kg, SpO2 100 %.  Medical Problem List and Plan: 1. Functional deficits secondary to R AKA due to thrombosis of graft and lack of flow per ABI's.              -patient may  shower- cover incision             -ELOS/Goals: ~ 2 weeks supervision at w/c level  -Con't CIR PT and OT 2.  Antithrombotics: -DVT/anticoagulation:  Pharmaceutical: Lovenox             -antiplatelet therapy: ASA.  3. Pain Management: Oxycodone prn. D/c dilaudid as not used for past 3 days             --add oxycontin for  more consistent relief. Flexeril prn for spasms. Pain has been uncontrolled, will attempt to get better control with long acting, and treating phantom pain             --reporting phantom pain also--may need to resume gabapentin.   3/20- will add Gabapentin 300 mg QHS  3/21- don't want to increase gabapentin due to Cr of 1.88- but encouraged touch of R AKA for phantom pain- pain much better controlled on Scheduled long acting.   3/22- pain doing better- tolerable  3/23 reports pain is under control, has not had recent  use of as needed oxycodone in last couple days 4. Mood/Behavior/Sleep: LCSW to follow for evaluation and support.             -antipsychotic agents: N/A.  --Continue ativan prn for anxiety. Consulted Dr. Sima Matas to help with coping/anxiety/limb loss. Doesn't want to look at AKA; also lost long term GF since hospital admission-  needs help coping/with grief             --Trazodone added for insomnia prn.  5. Neuropsych/cognition: This patient is capable of making decisions on his  own behalf.  3/22- has seen Neuropsych- doing better somewhat  6. Skin/Wound Care: Monitor wound for healing. Monitor for blistering.   7. Fluids/Electrolytes/Nutrition: Monitor I/O. Check CMET in am.              --Protein supplements added to promote wound healing.  8.  Leucocytosis/Fevers: WBC 18.4-->9.4/ due to RLE cellulitis now s/p AKA.              --Completed ceftriaxone X 5 days  --Resolving and will continue to monitor.  9. HTN: Monitor BP TID. Continue metoprolol --continue to hold prinzide due to AKI and as BP controlled.   3/21- BP controlled in spite of holding BP med- con't to monitor 3/22- BP a little low- con't regimen 3/23 blood pressure overall stable, continue current regimen    03/17/2023   12:51 PM 03/17/2023    3:34 AM 03/16/2023    7:31 PM  Vitals with BMI  Systolic [id number]  Diastolic 56 61 58  Pulse 83 79 87    10. Acute on chronic renal failure: BUN/SCr trending down from 127/5.1-->38/1.87 --continue sodium bicarb as CO2  still elevated but improved 13-->21  3/20- Cr stable at 1.88- BUN very slightly higher at 41 from 36- and CO2 better at 22- con't to monitor 3/21- will recheck in AM- might need some more IVFs- will push fluids 3/22- Cr 1.77 and BUN 57- has continued to increase- was 36-40- will do IVFs to help with dehydration/Azotemia in addition to AKI. Will do IVFs 60cc/hour x 24 hours and recheck BMP in AM.   -3/23 BUN and creatinine improved today to 42/1.59 after 24 hours  of IV fluids, encourage oral fluid intake 11. Acute on chronic anemia: Improved post transfusion 3 total units             --add iron supplement. Will con't to monitor  3/20- stable today- 9.1- better today- con't to monitor 13. Low protein stores: Add juven.  14. Abnormal LFTs: Continue to monitor. Recheck in am.  15. Elevated BS: Question stress/infection related. Will check Hgb A1c  3/21- HbA1c 5.8- will stop CBGs.   16. GERD: Continue PPI (was better with Nexium) 17. Concern for bowel incontinence  3/21- not on any scheduled pain meds- will likely get backed up, but will encourage to be on scheduled Laxative  3/22- LBM yesterday per pt- cannot find it documented, but maybe was with therapy?  3/23 last BM today, continent 18.  Hyponatremia: mild continue to monitor       LOS: 4 days A FACE TO FACE EVALUATION WAS PERFORMED  Jennye Boroughs 03/17/2023, 5:24 PM

## 2023-03-18 DIAGNOSIS — G546 Phantom limb syndrome with pain: Secondary | ICD-10-CM

## 2023-03-18 DIAGNOSIS — D649 Anemia, unspecified: Secondary | ICD-10-CM

## 2023-03-18 MED ORDER — GABAPENTIN 100 MG PO CAPS
100.0000 mg | ORAL_CAPSULE | Freq: Every day | ORAL | Status: DC
  Administered 2023-03-18 – 2023-03-26 (×9): 100 mg via ORAL
  Filled 2023-03-18 (×9): qty 1

## 2023-03-18 NOTE — Progress Notes (Signed)
Physical Therapy Session Note  Patient Details  Name: [name] MRN: [id number] Date of Birth: Feb 15, 1951  Today's Date: 03/18/2023 PT Individual Time: 0800-0900 PT Individual Time Calculation (min): 60 min   Short Term Goals: Week 1:  PT Short Term Goal 1 (Week 1): STG=LTG due to ELOS  Skilled Therapeutic Interventions/Progress Updates:      Therapy Documentation Precautions:  Precautions Precautions: Fall Precaution Comments: phantom limb pain RLE Restrictions Weight Bearing Restrictions: Yes RLE Weight Bearing: Non weight bearing  Pt received semi-reclined in bed and declines pain. Pt reports he needs his brief change from bladder incontinence episode. Pt requires supervision with rolling and min A for posterior peri-care.   Pt supervision with supine to sit and with upper/lower body dressing at edge of bed with increased time. Pt CGA with STS and ambulatory transfer to w/c with good left foot clearance with hop to gait pattern.   Pt set-up for self-grooming at sink in w/c and pt self-propelled w/c ~150 to dayroom.   Pt provided PT with home measurement sheet and engaged in discussion with home set-up. Pt performed stand pivot transfer and supine to sit from simulated bed height with close (S) for safety. Pt able to recall appropriate AD management with transfers.   Pt requires increased time with sit to stand and decreased posterior chain activation therefore pt performed mini squats 2 x 6 with RW to facilitate increased muscle activation.   Pt self-propelled w/c to room and left seated in recliner at bedside with all needs in reach and alarm on.   Therapy/Group: Individual Therapy  Verl Dicker Verl Dicker PT, DPT  03/18/2023, 7:45 AM

## 2023-03-18 NOTE — Progress Notes (Signed)
Occupational Therapy Session Note  Patient Details  Name: MCKYLE TORTORA MRN: [id number] Date of Birth: [birth year]  {CHL IP REHAB OT TIME CALCULATIONS:304400400}  {CHL IP REHAB OT TIME CALCULATIONS:304400400}  Short Term Goals: Week 1:  OT Short Term Goal 1 (Week 1): STGs=LTGs due to patient's length of stay.  Skilled Therapeutic Interventions/Progress Updates:   Session 1: Pt received *** for skilled OT session with focus on ***. Pt agreeable to interventions, demonstrating overall *** mood. Pt reported ***/10 pain, stating "***" in reference to ***. OT offering intermediate rest breaks and positioning suggestions throughout session to address pain/fatigue and maximize participation/safety in session.    Pt remained *** with all immediate needs met at end of session. Pt continues to be appropriate for skilled OT intervention to promote further functional independence.   Session 2: Pt received *** for skilled OT session with focus on ***. Pt agreeable to interventions, demonstrating overall *** mood. Pt reported ***/10 pain, stating "***" in reference to ***. OT offering intermediate rest breaks and positioning suggestions throughout session to address pain/fatigue and maximize participation/safety in session.    Pt remained *** with all immediate needs met at end of session. Pt continues to be appropriate for skilled OT intervention to promote further functional independence.    Therapy Documentation Precautions:  Precautions Precautions: Fall Precaution Comments: phantom limb pain RLE Restrictions Weight Bearing Restrictions: Yes RLE Weight Bearing: Non weight bearing   Therapy/Group: Individual Therapy  Maudie Mercury, OTR/L, MSOT  03/18/2023, 9:57 PM

## 2023-03-18 NOTE — Progress Notes (Signed)
Occupational Therapy Session Note  Patient Details  Name: [name] MRN: [id number] Date of Birth: [birth year]  Today's Date: 03/18/2023 OT Individual Time: 1015-1055 OT Individual Time Calculation (min): 40 min   Today's Date: 03/18/2023 OT Individual Time: [id number] Individual Time Calculation (min): 42 min   Short Term Goals: Week 1:  OT Short Term Goal 1 (Week 1): STGs=LTGs due to patient's length of stay.  Skilled Therapeutic Interventions/Progress Updates:   Session 1: Pt received sitting in recliner for skilled OT session with focus on discharge planning and DME recommendations. Pt agreeable to interventions, demonstrating overall pleasant mood. Pt with no reports of pain, stating "I'm getting better at looking at my leg (R-residual limb). . ." OT offering intermediate rest breaks and positioning suggestions throughout session to address pain/fatigue and maximize participation/safety in session.   Pt and OT discuss DME recommendations with information provided for bariatric BSC vs standard BSC in regards to in-pocket purchase and insurance coverage. Pt receptive of information, handout provided for bariatric BSC from [employer] as patient is an employee and qualifies for discount.   Pt self-propels WC from room<>ADL apartment with supervision. In ADL apartment, OT and pt further review TTB and BSC, pt planning to get pictures of home-bathroom setup for more effective/appropriate training.   Pt remained in care of NT for toileting with all other immediate needs met at end of session. Pt continues to be appropriate for skilled OT intervention to promote further functional independence.   Session 2: Pt received sitting in recliner for skilled OT session with focus on functional mobility at [facility] level and UE strengthening. Pt agreeable to interventions, demonstrating overall pleasant mood. Pt with no reports of pain. OT offering intermediate rest breaks and positioning suggestions  throughout session to address potential pain/fatigue and maximize participation/safety in session.   Pt completes stand-step transfer from EOB<>WC, self-propelling to day room with supervision. In day room, pt participates in series of UE strengthening exercises, including:  -WC push-ups (8 reps with 5 sec hold x 2 sets) -Punches (8 reps x 1 set) -Overhead shoulder press (12 reps x 1 set)  Pt completes exercises with 5# DB, requiring multimodal cuing for correct form. In room, pt completes brief change due to incontinence, requiring overall Mod A as patient maintains standing balance with sup + RW.   Pt remained sitting in recliner with all immediate needs met at end of session. Pt continues to be appropriate for skilled OT intervention to promote further functional independence.   Therapy Documentation Precautions:  Precautions Precautions: Fall Precaution Comments: phantom limb pain RLE Restrictions Weight Bearing Restrictions: Yes RLE Weight Bearing: Non weight bearing   Therapy/Group: Individual Therapy  Maudie Mercury, OTR/L, MSOT  03/18/2023, 6:24 AM

## 2023-03-18 NOTE — Progress Notes (Signed)
PROGRESS NOTE   Subjective/Complaints:  Patient reports he is doing well overall today.  Reports he does have some occasional phantom pain during the day.  He is getting gabapentin 300 mg at bedtime. FOBT check negative yesterday  ROS:  Pt denies SOB, abd pain, headache, CP, N/V/C/D, and vision changes + Phantom pain   Objective:   No results found. No results for input(s): "WBC", "HGB", "HCT", "PLT" in the last 72 hours.  Recent Labs    03/16/23 0645 03/17/23 0646  NA 131* 133*  K 4.7 4.4  CL 99 99  CO2 23 26  GLUCOSE 115* 99  BUN 57* 42*  CREATININE 1.77* 1.59*  CALCIUM 9.0 8.9     Intake/Output Summary (Last 24 hours) at 03/18/2023 1452 Last data filed at 03/18/2023 0810 Gross per 24 hour  Intake 477 ml  Output 700 ml  Net -223 ml         Physical Exam: Vital Signs Blood pressure (!) 106/55, pulse 82, temperature 97.6 F (36.4 C), temperature source Oral, resp. rate 17, height 6' (1.829 m), weight 89.5 kg, SpO2 100 %.     General: awake, alert, appropriate HENT: conjugate gaze; oropharynx moist CV: RRR Pulmonary: Clear to auscultation bilaterally, nonlabored GI: soft, NT, ND, (+)BS Psychiatric: appropriate-pleasant Neurological: Ox3, follows commands  Musculoskeletal:     Cervical back: Neck supple. No tenderness.     Comments: R-AKA with multiple ecchymotic areas--question early blister as well as blister? inner thigh covered with foam dressing. However except ecchymoses, staples intact, doesn't have more than moderate swelling and very TTP as expected- looks the same- maybe less swelling slightly- pt rubbing more- before wouldn't touch leg UE strength 5-/5 throughout B/L LLE- 5-/5 in HF, KE, KF, DF and PF RLE- NT per pt request    Skin:    General: Skin is warm and dry.     Comments: IV R forearm looks OK Inner groin/thigh dressing from prior bypass R AKA incision per the MS   Neurological:     Mental Status: He is alert and oriented to person, place, and time.  Follows commands    Comments: Intact to light touch in LLE and RLE/AKA  Assessment/Plan: 1. Functional deficits which require 3+ hours per day of interdisciplinary therapy in a comprehensive inpatient rehab setting. Physiatrist is providing close team supervision and 24 hour management of active medical problems listed below. Physiatrist and rehab team continue to assess barriers to discharge/monitor patient progress toward functional and medical goals  Care Tool:  Bathing    Body parts bathed by patient: Right arm, Left arm, Chest, Abdomen, Front perineal area, Right upper leg, Left upper leg, Face, Left lower leg   Body parts bathed by helper: Buttocks Body parts n/a: Right lower leg   Bathing assist Assist Level: Minimal Assistance - Patient > 75%     Upper Body Dressing/Undressing Upper body dressing   What is the patient wearing?: Pull over shirt    Upper body assist Assist Level: Set up assist    Lower Body Dressing/Undressing Lower body dressing      What is the patient wearing?: Incontinence brief, Pants     Lower  body assist Assist for lower body dressing: Minimal Assistance - Patient > 75%     Toileting Toileting    Toileting assist Assist for toileting: Minimal Assistance - Patient > 75%     Transfers Chair/bed transfer  Transfers assist     Chair/bed transfer assist level: Contact Guard/Touching assist     Locomotion Ambulation   Ambulation assist   Ambulation activity did not occur: Safety/medical concerns (faigue, AKA)  Assist level: Contact Guard/Touching assist Assistive device: Walker-platform Max distance: 38'   Walk 10 feet activity   Assist  Walk 10 feet activity did not occur: Safety/medical concerns  Assist level: Contact Guard/Touching assist Assistive device: Walker-rolling   Walk 50 feet activity   Assist Walk 50 feet with 2  turns activity did not occur: Safety/medical concerns  Assist level: Contact Guard/Touching assist Assistive device: Walker-rolling    Walk 150 feet activity   Assist Walk 150 feet activity did not occur: Safety/medical concerns         Walk 10 feet on uneven surface  activity   Assist Walk 10 feet on uneven surfaces activity did not occur: Safety/medical concerns (faitgue, AKA)         Wheelchair     Assist Is the patient using a wheelchair?: Yes Type of Wheelchair: Manual    Wheelchair assist level: Independent Max wheelchair distance: >150 ft    Wheelchair 50 feet with 2 turns activity    Assist        Assist Level: Independent   Wheelchair 150 feet activity     Assist      Assist Level: Independent   Blood pressure (!) 106/55, pulse 82, temperature 97.6 F (36.4 C), temperature source Oral, resp. rate 17, height 6' (1.829 m), weight 89.5 kg, SpO2 100 %.  Medical Problem List and Plan: 1. Functional deficits secondary to R AKA due to thrombosis of graft and lack of flow per ABI's.              -patient may  shower- cover incision             -ELOS/Goals: ~ 2 weeks supervision at w/c level  -Con't CIR PT and OT 2.  Antithrombotics: -DVT/anticoagulation:  Pharmaceutical: Lovenox             -antiplatelet therapy: ASA.  3. Pain Management: Oxycodone prn. D/c dilaudid as not used for past 3 days             --add oxycontin for  more consistent relief. Flexeril prn for spasms. Pain has been uncontrolled, will attempt to get better control with long acting, and treating phantom pain             --reporting phantom pain also--may need to resume gabapentin.   3/20- will add Gabapentin 300 mg QHS  3/21- don't want to increase gabapentin due to Cr of 1.88- but encouraged touch of R AKA for phantom pain- pain much better controlled on Scheduled long acting.   3/22- pain doing better- tolerable  3/23 reports pain is under control, has not had recent  use of as needed oxycodone in last couple days  -3/24 Cr trending down, continue gabapentin 300 mg HS, add 100mg  in the AM for phantom pain 4. Mood/Behavior/Sleep: LCSW to follow for evaluation and support.             -antipsychotic agents: N/A.  --Continue ativan prn for anxiety. Consulted Dr. Sima Matas to help with coping/anxiety/limb loss. Doesn't want to look at  AKA; also lost long term GF since hospital admission- needs help coping/with grief             --Trazodone added for insomnia prn.  5. Neuropsych/cognition: This patient is capable of making decisions on his  own behalf.  3/22- has seen Neuropsych- doing better somewhat  6. Skin/Wound Care: Monitor wound for healing. Monitor for blistering.   7. Fluids/Electrolytes/Nutrition: Monitor I/O. Check CMET in am.              --Protein supplements added to promote wound healing.  8.  Leucocytosis/Fevers: WBC 18.4-->9.4/ due to RLE cellulitis now s/p AKA.              --Completed ceftriaxone X 5 days  --Resolving and will continue to monitor.  9. HTN: Monitor BP TID. Continue metoprolol --continue to hold prinzide due to AKI and as BP controlled.   3/21- BP controlled in spite of holding BP med- con't to monitor 3/22- BP a little low- con't regimen 3/24 BP a little soft but controlled overall, continue to monitor    03/18/2023    2:30 PM 03/18/2023    5:36 AM 03/17/2023    7:37 PM  Vitals with BMI  Systolic [id number]  Diastolic 55 58 66  Pulse 82 74 89    10. Acute on chronic renal failure: BUN/SCr trending down from 127/5.1-->38/1.87 --continue sodium bicarb as CO2  still elevated but improved 13-->21  3/20- Cr stable at 1.88- BUN very slightly higher at 41 from 36- and CO2 better at 22- con't to monitor 3/21- will recheck in AM- might need some more IVFs- will push fluids 3/22- Cr 1.77 and BUN 57- has continued to increase- was 36-40- will do IVFs to help with dehydration/Azotemia in addition to AKI. Will do IVFs 60cc/hour x  24 hours and recheck BMP in AM.   -3/23 BUN and creatinine improved today to 42/1.59 after 24 hours of IV fluids, encourage oral fluid intake -3/24 continue to encourage oral fluids, recheck BMP tomorrow 11. Acute on chronic anemia: Improved post transfusion 3 total units             --add iron supplement. Will con't to monitor  3/20- stable today- 9.1- better today- con't to monitor  -Recheck CBC tomorrow 13. Low protein stores: Add juven.  14. Abnormal LFTs: Continue to monitor. Recheck in am.  15. Elevated BS: Question stress/infection related. Will check Hgb A1c  3/21- HbA1c 5.8- will stop CBGs.   16. GERD: Continue PPI (was better with Nexium) 17. Concern for bowel incontinence  3/21- not on any scheduled pain meds- will likely get backed up, but will encourage to be on scheduled Laxative  3/22- LBM yesterday per pt- cannot find it documented, but maybe was with therapy?  3/23 last BM today, continent 18.  Hyponatremia: mild continue to monitor  -Recheck tomorrow       LOS: 5 days A FACE TO FACE EVALUATION WAS PERFORMED  Jennye Boroughs 03/18/2023, 2:52 PM

## 2023-03-18 NOTE — Progress Notes (Signed)
Physical Therapy Session Note  Patient Details  Name: [name] MRN: [id number] Date of Birth: 04-22-1951  Today's Date: 03/18/2023 PT Individual Time: [id number] PT Individual Time Calculation (min): 43 min   Short Term Goals: Week 1:  PT Short Term Goal 1 (Week 1): STG=LTG due to ELOS  Skilled Therapeutic Interventions/Progress Updates:  Pt was seen bedside in the pm. Pt transferred recliner to w/c with rolling walker and c/g. Pt propelled w/c to and from the gym with B UEs for UEs strengthening and endurance. Pt performed multiple sit to stand transfers with c/g and rolling walker. Pt ambulated 5 feet with rolling walker and c/g. Pt performed B hip flex, L LAQs, 3 sets x 10 reps each. Arm chair push ups 6 sets x 5 reps each. Pt returned to room and left sitting up in recliner with chair alarm in place and all needs within reach.   Therapy Documentation Precautions:  Precautions Precautions: Fall Precaution Comments: phantom limb pain RLE Restrictions Weight Bearing Restrictions: Yes RLE Weight Bearing: Non weight bearing General:   Pain: No c/o pain.    Therapy/Group: Individual Therapy  Dub Amis 03/18/2023, 3:31 PM

## 2023-03-19 LAB — CBC
HCT: 28 % — ABNORMAL LOW (ref 39.0–52.0)
Hemoglobin: 8.7 g/dL — ABNORMAL LOW (ref 13.0–17.0)
MCH: 28.6 pg (ref 26.0–34.0)
MCHC: 31.1 g/dL (ref 30.0–36.0)
MCV: 92.1 fL (ref 80.0–100.0)
Platelets: 517 10*3/uL — ABNORMAL HIGH (ref 150–400)
RBC: 3.04 MIL/uL — ABNORMAL LOW (ref 4.22–5.81)
RDW: 15.2 % (ref 11.5–15.5)
WBC: 8 10*3/uL (ref 4.0–10.5)
nRBC: 0 % (ref 0.0–0.2)

## 2023-03-19 LAB — BASIC METABOLIC PANEL
Anion gap: 11 (ref 5–15)
BUN: 30 mg/dL — ABNORMAL HIGH (ref 8–23)
CO2: 24 mmol/L (ref 22–32)
Calcium: 9.2 mg/dL (ref 8.9–10.3)
Chloride: 98 mmol/L (ref 98–111)
Creatinine, Ser: 1.61 mg/dL — ABNORMAL HIGH (ref 0.61–1.24)
GFR, Estimated: 45 mL/min — ABNORMAL LOW (ref >60)
Glucose, Bld: 108 mg/dL — ABNORMAL HIGH (ref 70–99)
Potassium: 5.2 mmol/L — ABNORMAL HIGH (ref 3.5–5.1)
Sodium: 133 mmol/L — ABNORMAL LOW (ref 135–145)

## 2023-03-19 NOTE — Progress Notes (Signed)
Orthopedic Tech Progress Note Patient Details:  [name] August 11, 1951 [id number]  Called in order to [employer] for a pair of AKA SHRINKERS   Patient ID: [name], male   DOB: 1951-02-26, 72 y.o.   MRN: [id number]  [name] 03/19/2023, 8:50 AM

## 2023-03-19 NOTE — Progress Notes (Signed)
PROGRESS NOTE   Subjective/Complaints:  Pt reports no BM since last Wed/Thursday- but usually only goes 1-2x/week at home "for years". Denies feeling constipated.  Usually eating 100% trays- did this AM.   Said got stool softener x3 days, but no results yet.   Doesn't have a shrinker-   ROS:   Pt denies SOB, abd pain, CP, N/V/C/D, and vision changes Except for HPI  Objective:   No results found. No results for input(s): "WBC", "HGB", "HCT", "PLT" in the last 72 hours.  Recent Labs    03/17/23 0646  NA 133*  K 4.4  CL 99  CO2 26  GLUCOSE 99  BUN 42*  CREATININE 1.59*  CALCIUM 8.9    Intake/Output Summary (Last 24 hours) at 03/19/2023 0827 Last data filed at 03/19/2023 [id number] Gross per 24 hour  Intake 600 ml  Output 950 ml  Net -350 ml        Physical Exam: Vital Signs Blood pressure (!) 130/58, pulse 86, temperature 98.8 F (37.1 C), resp. rate 17, height 6' (1.829 m), weight 89.5 kg, SpO2 97 %.      General: awake, alert, appropriate, sitting up in bed; nurse and OT at bedside; NAD HENT: conjugate gaze; oropharynx moist CV: regular rate; no JVD Pulmonary: CTA B/L; no W/R/R- good air movement GI: soft, NT, maybe slightly distended vs protuberant; hypoactive BS Psychiatric: appropriate- very interactive Neurological: Ox3  Musculoskeletal:     Cervical back: Neck supple. No tenderness.     Comments: R-AKA with multiple ecchymotic areas--question early blister as well as blister? inner thigh covered with foam dressing. However except ecchymoses, staples has blister inside of L AKA/medial aspect- covered with dressing- and large dressing over tip of AKA- still has dog ears.  UE strength 5-/5 throughout B/L LLE- 5-/5 in HF, KE, KF, DF and PF RLE- NT per pt request    Skin:    General: Skin is warm and dry.     Comments: IV R forearm looks OK Inner groin/thigh dressing from prior bypass R AKA  incision per the MS  Neurological:     Mental Status: He is alert and oriented to person, place, and time.  Follows commands    Comments: Intact to light touch in LLE and RLE/AKA  Assessment/Plan: 1. Functional deficits which require 3+ hours per day of interdisciplinary therapy in a comprehensive inpatient rehab setting. Physiatrist is providing close team supervision and 24 hour management of active medical problems listed below. Physiatrist and rehab team continue to assess barriers to discharge/monitor patient progress toward functional and medical goals  Care Tool:  Bathing    Body parts bathed by patient: Right arm, Left arm, Chest, Abdomen, Front perineal area, Right upper leg, Left upper leg, Face, Left lower leg   Body parts bathed by helper: Buttocks Body parts n/a: Right lower leg   Bathing assist Assist Level: Minimal Assistance - Patient > 75%     Upper Body Dressing/Undressing Upper body dressing   What is the patient wearing?: Pull over shirt    Upper body assist Assist Level: Set up assist    Lower Body Dressing/Undressing Lower body dressing  What is the patient wearing?: Incontinence brief, Pants     Lower body assist Assist for lower body dressing: Minimal Assistance - Patient > 75%     Toileting Toileting    Toileting assist Assist for toileting: Minimal Assistance - Patient > 75%     Transfers Chair/bed transfer  Transfers assist     Chair/bed transfer assist level: Contact Guard/Touching assist     Locomotion Ambulation   Ambulation assist   Ambulation activity did not occur: Safety/medical concerns (faigue, AKA)  Assist level: Contact Guard/Touching assist Assistive device: Walker-rolling Max distance: 5   Walk 10 feet activity   Assist  Walk 10 feet activity did not occur: Safety/medical concerns  Assist level: Contact Guard/Touching assist Assistive device: Walker-rolling   Walk 50 feet activity   Assist Walk 50  feet with 2 turns activity did not occur: Safety/medical concerns  Assist level: Contact Guard/Touching assist Assistive device: Walker-rolling    Walk 150 feet activity   Assist Walk 150 feet activity did not occur: Safety/medical concerns         Walk 10 feet on uneven surface  activity   Assist Walk 10 feet on uneven surfaces activity did not occur: Safety/medical concerns (faitgue, AKA)         Wheelchair     Assist Is the patient using a wheelchair?: Yes Type of Wheelchair: Manual    Wheelchair assist level: Independent Max wheelchair distance: >150 ft    Wheelchair 50 feet with 2 turns activity    Assist        Assist Level: Independent   Wheelchair 150 feet activity     Assist      Assist Level: Independent   Blood pressure (!) 130/58, pulse 86, temperature 98.8 F (37.1 C), resp. rate 17, height 6' (1.829 m), weight 89.5 kg, SpO2 97 %.  Medical Problem List and Plan: 1. Functional deficits secondary to R AKA due to thrombosis of graft and lack of flow per ABI's.              -patient may  shower- cover incision             -ELOS/Goals: ~ 2 weeks supervision at w/c level  -Con't CIR PT and OT- will order shrinkers x2 for pt- educated why needs them- for shaping.  2.  Antithrombotics: -DVT/anticoagulation:  Pharmaceutical: Lovenox             -antiplatelet therapy: ASA.  3. Pain Management: Oxycodone prn. D/c dilaudid as not used for past 3 days             --add oxycontin for  more consistent relief. Flexeril prn for spasms. Pain has been uncontrolled, will attempt to get better control with long acting, and treating phantom pain             --reporting phantom pain also--may need to resume gabapentin.   3/20- will add Gabapentin 300 mg QHS  3/21- don't want to increase gabapentin due to Cr of 1.88- but encouraged touch of R AKA for phantom pain- pain much better controlled on Scheduled long acting.   3/22- pain doing better-  tolerable  3/23 reports pain is under control, has not had recent use of as needed oxycodone in last couple days  -3/24 Cr trending down, continue gabapentin 300 mg HS, add 100mg  in the AM for phantom pain  3/25- still having phantom and residual limb pain, but says it much more tolerable- con't regimen 4. Mood/Behavior/Sleep: LCSW  to follow for evaluation and support.             -antipsychotic agents: N/A.  --Continue ativan prn for anxiety. Consulted Dr. Sima Matas to help with coping/anxiety/limb loss. Doesn't want to look at AKA; also lost long term GF since hospital admission- needs help coping/with grief             --Trazodone added for insomnia prn.  5. Neuropsych/cognition: This patient is capable of making decisions on his  own behalf.  3/22- has seen Neuropsych- doing better somewhat  6. Skin/Wound Care: Monitor wound for healing. Monitor for blistering.   7. Fluids/Electrolytes/Nutrition: Monitor I/O. Check CMET in am.              --Protein supplements added to promote wound healing.  8.  Leucocytosis/Fevers: WBC 18.4-->9.4/ due to RLE cellulitis now s/p AKA.              --Completed ceftriaxone X 5 days  --Resolving and will continue to monitor.  9. HTN: Monitor BP TID. Continue metoprolol --continue to hold prinzide due to AKI and as BP controlled.   3/21- BP controlled in spite of holding BP med- con't to monitor 3/25- BP looking better- con't regimen    03/19/2023    8:04 AM 03/19/2023    5:24 AM 03/18/2023    7:37 PM  Vitals with BMI  Systolic [id number]  Diastolic 58 62 51  Pulse 86 73 85    10. Acute on chronic renal failure: BUN/SCr trending down from 127/5.1-->38/1.87 --continue sodium bicarb as CO2  still elevated but improved 13-->21  3/20- Cr stable at 1.88- BUN very slightly higher at 41 from 36- and CO2 better at 22- con't to monitor 3/21- will recheck in AM- might need some more IVFs- will push fluids 3/22- Cr 1.77 and BUN 57- has continued to increase-  was 36-40- will do IVFs to help with dehydration/Azotemia in addition to AKI. Will do IVFs 60cc/hour x 24 hours and recheck BMP in AM.   -3/23 BUN and creatinine improved today to 42/1.59 after 24 hours of IV fluids, encourage oral fluid intake -3/24 continue to encourage oral fluids, recheck BMP tomorrow 3/25- labs pending- last Cr down to 1.59- BUN down to 42 from 57.  11. Acute on chronic anemia: Improved post transfusion 3 total units             --add iron supplement. Will con't to monitor  3/20- stable today- 9.1- better today- con't to monitor  -Recheck CBC tomorrow 13. Low protein stores: Add juven.  14. Abnormal LFTs: Continue to monitor. Recheck in am.  15. Elevated BS: Question stress/infection related. Will check Hgb A1c  3/21- HbA1c 5.8- will stop CBGs.   16. GERD: Continue PPI (was better with Nexium) 17. Concern for bowel incontinence  3/21- not on any scheduled pain meds- will likely get backed up, but will encourage to be on scheduled Laxative  3/22- LBM yesterday per pt- cannot find it documented, but maybe was with therapy?  3/23 last BM today, continent  3/25- LBM Wed/Thursday last week- usually goes 1-2x/week at home- normal for him- will give Miralax today- has been getting prn stool softeners but no laxatives- scared of incontinence.  18.  Hyponatremia: mild continue to monitor  -Recheck tomorrow  3/25- Na 133- doing better     I spent a total of  37  minutes on total care today- >50% coordination of care- due to  Discussing about shrinkers  and addressing pt concerns about staff and pain and bowels- d/w nursing to give Miralax since constipated. Denies feeling that way though, so will wait on Sorbitol.   LOS: 6 days A FACE TO FACE EVALUATION WAS PERFORMED  Alaiza Yau 03/19/2023, 8:27 AM

## 2023-03-19 NOTE — Progress Notes (Addendum)
Physical Therapy Session Note  Patient Details  Name: [name] MRN: [id number] Date of Birth: 1951-11-13  Today's Date: 03/19/2023 PT Individual Time: [id number] PT Individual Time Calculation (min): 62 min   Short Term Goals: Week 1:  PT Short Term Goal 1 (Week 1): STG=LTG due to ELOS  Skilled Therapeutic Interventions/Progress Updates:    Pt presents in room in recliner and agreeable to PT. Pt reports feeling emotional at times with new limb loss, pt educated on various community and hospital resources such as support group with pt reporting not feeling comfortable in a group setting. Pt then reports that he is experiencing 6/10 phantom limb pain and agreeable to education and trial of mirror therapy for phantom limb pain. Pt completes sit<>stand from recliner with CGA, requests to use urinal in standing with pt able to manage pants and urinal unilateral UE support on RW, CGA for standing balance. Noted that pt with incontinent episode and requiring brief change, pt sits to recliner to doff brief and requires min assist for donning new brief, started in sitting and pulled up over hips in standing with pt utilizing unilateral UE support on RW. Pt then ambs ~3' to [facility] with RW CGA with pt demonstrating good foot clearance with LLE. Pt then self propels WC with BUEs to day room ~150' supervision and sets up [facility] for transfer onto mat. Pt then completes stand pivot transfer with RW CGA and sit to supine supervision with elevated head to allow for viewing of mirror. Pt educated on mirror therapy and in supine pt completes ankle pumps, toe movement without shoe and sock, and light touch sensation 30sec each with rest breaks between for education. Pt then sits EOB supervision and completes mirror therapy in sitting with pt completing ankle pumps, toe movement, LAQs for 30sec each, and watching therapist donn shoe. Pt reports decrease in phantom pain following mirror therapy and provided with education  on alternative nonpharmalogic treatments such as densensitization of residual limb with pt verbalizing and demonstrating understanding. Pt completes transfer mat to [facility] CGA stand pivot with RW. Pt then completes 5 Time Sit to Stand test to RW from wheelchair in 1:37min. Pt then returns to room transported dependently in [facility] for time management and completes stand pivot transfer RW CGA WC to recliner. Pt remains seated in WC with all needs within reach, call light in place, and chair alarm activated at end of session.  Therapy Documentation Precautions:  Precautions Precautions: Fall Precaution Comments: phantom limb pain RLE Restrictions Weight Bearing Restrictions: Yes RLE Weight Bearing: Non weight bearing   Therapy/Group: Individual Therapy  Lorna Dibble 03/19/2023, 12:11 PM

## 2023-03-20 ENCOUNTER — Ambulatory Visit (HOSPITAL_BASED_OUTPATIENT_CLINIC_OR_DEPARTMENT_OTHER): Payer: Medicare Other | Admitting: Internal Medicine

## 2023-03-20 LAB — BASIC METABOLIC PANEL
Anion gap: 11 (ref 5–15)
BUN: 38 mg/dL — ABNORMAL HIGH (ref 8–23)
CO2: 23 mmol/L (ref 22–32)
Calcium: 9 mg/dL (ref 8.9–10.3)
Chloride: 99 mmol/L (ref 98–111)
Creatinine, Ser: 1.57 mg/dL — ABNORMAL HIGH (ref 0.61–1.24)
GFR, Estimated: 46 mL/min — ABNORMAL LOW (ref >60)
Glucose, Bld: 96 mg/dL (ref 70–99)
Potassium: 4.7 mmol/L (ref 3.5–5.1)
Sodium: 133 mmol/L — ABNORMAL LOW (ref 135–145)

## 2023-03-20 LAB — HEMOGLOBIN A1C
Hgb A1c MFr Bld: 5.7 % — ABNORMAL HIGH (ref 4.8–5.6)
Mean Plasma Glucose: 117 mg/dL

## 2023-03-20 LAB — OCCULT BLOOD X 1 CARD TO LAB, STOOL: Fecal Occult Bld: NEGATIVE

## 2023-03-20 MED ORDER — SORBITOL 70 % SOLN: 30.0000 mL | Freq: Once | Status: DC

## 2023-03-20 NOTE — Patient Care Conference (Signed)
Inpatient RehabilitationTeam Conference and Plan of Care Update Date: 03/20/2023   Time: 11:51 AM    Patient Name: [name]      Medical Record Number: [id number]  Date of Birth: 1951/11/20 Sex: Male         Room/Bed: 4W23C/4W23C-01 Payor Info: Payor: Wanda / Plan: [employer]HC MEDICARE / Product Type: *No Product type* /    Admit Date/Time:  03/13/2023  5:57 PM  Primary Diagnosis:  Above knee amputation of right lower extremity [facility])  Hospital Problems: Principal Problem:   Above knee amputation of right lower extremity ([place]) Active Problems:   CKD (chronic kidney disease) stage 3, GFR 30-59 ml/min (HCC)   Adjustment disorder with mixed anxiety and depressed mood    Expected Discharge Date: Expected Discharge Date: 03/26/23  Team Members Present: Physician leading conference: Dr. Courtney Heys Social Worker Present: Ovidio Kin, LCSW Nurse Present: Tacy Learn, RN PT Present: Verl Dicker, PT OT Present: Jamey Ripa, OT PPS Coordinator present : Gunnar Fusi, SLP     Current Status/Progress Goal Weekly Team Focus  Bowel/Bladder   Patient is continent x2. Last BM 3/25.   Remain continent.   Assess toileting needs.    Swallow/Nutrition/ Hydration               ADL's   Setup for UB ADLs, Min A for LB ADLs & toileting, CGA-Min A for functional transfers   Mod I WC level   Activity tolerance, functional mobility/transfers, IADLs, caregiver training    Mobility   supervision bed, CGASTS, stand pivot with RW, car transfers, supervision w/c propulsion >150 ft, CGA gait x 41 ft   Mod I transfers, CGA Gait  phantom limb pain management, standing balance, transfers, gait (limited distances)    Communication                Safety/Cognition/ Behavioral Observations               Pain   Denies pain.   Pain free.   Assess pain q shift and prn.    Skin   Staples to Right aka with stump shrinker. CDI. Blister to right thigh with  foam dsg.   No skin breakdown.  Assess sklin q shift prn.      Discharge Planning:  HOme with sister assisting with his care-doing well in therapies. Neuro-psych saw last week for coping. Pt motivated to do well and regain his independence   Team Discussion: Right AKA. Continent B/B. Sorbitol today. Pain managed with PRN medications. Right incision has staples with minimal SS drainage. Does have blister to incision. Push fluids. Sister is retired Marine scientist that still works. Setup for UB ADLs. Min A for LB ADLs and toileting. CGA/Min A for functional transfers. Supervision bed mobility. SP with RW, car transfers, supervision w/c propulsion greater 150' CGA gait x 41'. Patient on target to meet rehab goals: yes, progressing towards goal   *See Care Plan and progress notes for long and short-term goals.   Revisions to Treatment Plan:  Medication adjustments, monitor labs  Teaching Needs: Medications, safety, self care, skin care, gait/transfer training, etc.   Current Barriers to Discharge: Decreased caregiver support, Wound care, Lack of/limited family support, and Weight bearing restrictions  Possible Resolutions to Barriers: Family education, nursing education, order recommended DME     Medical Summary Current Status: R AKA- incision looks great- popped blister inner R AKA- continent x2- but LBM 3/23? pt says Wednesday/Thursday-  Barriers to Discharge: Self-care education;Uncontrolled  Pain;Weight bearing restrictions;Complicated Wound;Renal Insufficiency/Failure;Electrolyte abnormality;Behavior/Mood  Barriers to Discharge Comments: Pt's dispo need to go home mod I- due to sister still working- other barriers- R AKA- depression lost GF- now touching R AKA more-- doesn't want to look at R AKA- AKI on CKD- Cr 1.57 and BUN 38 Possible Resolutions to [employer]: looked at limb for first day today-seen neuropsych for grief/depression- not drinking enough- pushing fluids- BMP Thursday  to f/u on AKI on CKD-- d/c 4/1   Continued Need for Acute Rehabilitation Level of Care: The patient requires daily medical management by a physician with specialized training in physical medicine and rehabilitation for the following reasons: Direction of a multidisciplinary physical rehabilitation program to maximize functional independence : Yes Medical management of patient stability for increased activity during participation in an intensive rehabilitation regime.: Yes Analysis of laboratory values and/or radiology reports with any subsequent need for medication adjustment and/or medical intervention. : Yes   I attest that I was present, lead the team conference, and concur with the assessment and plan of the team.   Ernest Pine 03/20/2023, 2:16 PM

## 2023-03-20 NOTE — Progress Notes (Signed)
PROGRESS NOTE   Subjective/Complaints:  Pt reports no BM yet- has been 5-6 days.   Also feeling more comfortable touching R AKA to help treat phantom pain with touch.  Blister popped.   Discussed need for shrinker-  But came- was too long- so OT sent back.   ROS:   Pt denies SOB, abd pain, CP, N/V/C/D, and vision changes  Except for HPI  Objective:   No results found. Recent Labs    03/19/23 1022  WBC 8.0  HGB 8.7*  HCT 28.0*  PLT 517*    Recent Labs    03/19/23 1022 03/20/23 0644  NA 133* 133*  K 5.2* 4.7  CL 98 99  CO2 24 23  GLUCOSE 108* 96  BUN 30* 38*  CREATININE 1.61* 1.57*  CALCIUM 9.2 9.0    Intake/Output Summary (Last 24 hours) at 03/20/2023 0845 Last data filed at 03/20/2023 0819 Gross per 24 hour  Intake 712 ml  Output 2150 ml  Net -1438 ml        Physical Exam: Vital Signs Blood pressure 114/65, pulse 78, temperature 98.3 F (36.8 C), temperature source Oral, resp. rate 18, height 6' (1.829 m), weight 89.5 kg, SpO2 97 %.       General: awake, alert, appropriate,  Sitting up in bed; nurse in room; NAD HENT: conjugate gaze; oropharynx a little dry CV: regular rate and rhythm; no JVD Pulmonary: CTA B/L; no W/R/R- good air movement GI: soft, NT, hypoactive BS; appears a little distended Psychiatric: appropriate Neurological: Ox3 R AKA- incision looks amazing- smaller dog ears- no drainage mild swelling-staples in place- popped blister with a little open skin- on R inner/medial thigh of R AKA   Musculoskeletal:     Cervical back: Neck supple. No tenderness.     Comments: R-AKA with multiple ecchymotic areas--question early blister as well as blister? inner thigh covered with foam dressing. However except ecchymoses, staples has blister inside of L AKA/medial aspect- covered with dressing- and large dressing over tip of AKA- still has dog ears.  UE strength 5-/5 throughout  B/L LLE- 5-/5 in HF, KE, KF, DF and PF RLE- NT per pt request    Skin:    General: Skin is warm and dry.     Comments: IV R forearm looks OK Inner groin/thigh dressing from prior bypass R AKA incision per the MS  Neurological:     Mental Status: He is alert and oriented to person, place, and time.  Follows commands    Comments: Intact to light touch in LLE and RLE/AKA  Assessment/Plan: 1. Functional deficits which require 3+ hours per day of interdisciplinary therapy in a comprehensive inpatient rehab setting. Physiatrist is providing close team supervision and 24 hour management of active medical problems listed below. Physiatrist and rehab team continue to assess barriers to discharge/monitor patient progress toward functional and medical goals  Care Tool:  Bathing    Body parts bathed by patient: Right arm, Left arm, Chest, Abdomen, Front perineal area, Right upper leg, Left upper leg, Face, Left lower leg   Body parts bathed by helper: Buttocks Body parts n/a: Right lower leg   Bathing assist Assist Level:  Minimal Assistance - Patient > 75%     Upper Body Dressing/Undressing Upper body dressing   What is the patient wearing?: Pull over shirt    Upper body assist Assist Level: Set up assist    Lower Body Dressing/Undressing Lower body dressing      What is the patient wearing?: Incontinence brief, Pants     Lower body assist Assist for lower body dressing: Minimal Assistance - Patient > 75%     Toileting Toileting    Toileting assist Assist for toileting: Minimal Assistance - Patient > 75%     Transfers Chair/bed transfer  Transfers assist     Chair/bed transfer assist level: Contact Guard/Touching assist     Locomotion Ambulation   Ambulation assist   Ambulation activity did not occur: Safety/medical concerns (faigue, AKA)  Assist level: Contact Guard/Touching assist Assistive device: Walker-rolling Max distance: 5   Walk 10 feet  activity   Assist  Walk 10 feet activity did not occur: Safety/medical concerns  Assist level: Contact Guard/Touching assist Assistive device: Walker-rolling   Walk 50 feet activity   Assist Walk 50 feet with 2 turns activity did not occur: Safety/medical concerns  Assist level: Contact Guard/Touching assist Assistive device: Walker-rolling    Walk 150 feet activity   Assist Walk 150 feet activity did not occur: Safety/medical concerns         Walk 10 feet on uneven surface  activity   Assist Walk 10 feet on uneven surfaces activity did not occur: Safety/medical concerns (faitgue, AKA)         Wheelchair     Assist Is the patient using a wheelchair?: Yes Type of Wheelchair: Manual    Wheelchair assist level: Independent Max wheelchair distance: >150 ft    Wheelchair 50 feet with 2 turns activity    Assist        Assist Level: Independent   Wheelchair 150 feet activity     Assist      Assist Level: Independent   Blood pressure 114/65, pulse 78, temperature 98.3 F (36.8 C), temperature source Oral, resp. rate 18, height 6' (1.829 m), weight 89.5 kg, SpO2 97 %.  Medical Problem List and Plan: 1. Functional deficits secondary to R AKA due to thrombosis of graft and lack of flow per ABI's.              -patient may  shower- cover incision             -ELOS/Goals: ~ 2 weeks supervision at w/c level  Con't CIR PT and OT- incision looks amazing- popped blister medial aspect of L AKA-  Team conference today to determine length of stay  - shrinkers ordered- came too big- per OT, reordered smaller size.  2.  Antithrombotics: -DVT/anticoagulation:  Pharmaceutical: Lovenox             -antiplatelet therapy: ASA.  3. Pain Management: Oxycodone prn. D/c dilaudid as not used for past 3 days             --add oxycontin for  more consistent relief. Flexeril prn for spasms. Pain has been uncontrolled, will attempt to get better control with long  acting, and treating phantom pain             --reporting phantom pain also--may need to resume gabapentin.   3/20- will add Gabapentin 300 mg QHS  3/21- don't want to increase gabapentin due to Cr of 1.88- but encouraged touch of R AKA for phantom pain- pain  much better controlled on Scheduled long acting.   3/22- pain doing better- tolerable  3/23 reports pain is under control, has not had recent use of as needed oxycodone in last couple days  -3/24 Cr trending down, continue gabapentin 300 mg HS, add 100mg  in the AM for phantom pain  3/26- pain much better- con't regimen 4. Mood/Behavior/Sleep: LCSW to follow for evaluation and support.             -antipsychotic agents: N/A.  --Continue ativan prn for anxiety. Consulted Dr. Sima Matas to help with coping/anxiety/limb loss. Doesn't want to look at AKA; also lost long term GF since hospital admission- needs help coping/with grief             --Trazodone added for insomnia prn.  5. Neuropsych/cognition: This patient is capable of making decisions on his  own behalf.  3/22- has seen Neuropsych- doing better somewhat  6. Skin/Wound Care: Monitor wound for healing. Monitor for blistering.   7. Fluids/Electrolytes/Nutrition: Monitor I/O. Check CMET in am.              --Protein supplements added to promote wound healing.  8.  Leucocytosis/Fevers: WBC 18.4-->9.4/ due to RLE cellulitis now s/p AKA.              --Completed ceftriaxone X 5 days  --Resolving and will continue to monitor.  9. HTN: Monitor BP TID. Continue metoprolol --continue to hold prinzide due to AKI and as BP controlled.   3/21- BP controlled in spite of holding BP med- con't to monitor 3/26- BP doing well- con't regimen    03/20/2023    4:53 AM 03/19/2023    7:48 PM 03/19/2023    2:37 PM  Vitals with BMI  Systolic [id number]  Diastolic 65 68 64  Pulse 78 81 80    10. Acute on chronic renal failure: BUN/SCr trending down from 127/5.1-->38/1.87 --continue sodium bicarb  as CO2  still elevated but improved 13-->21  3/20- Cr stable at 1.88- BUN very slightly higher at 41 from 36- and CO2 better at 22- con't to monitor 3/21- will recheck in AM- might need some more IVFs- will push fluids 3/22- Cr 1.77 and BUN 57- has continued to increase- was 36-40- will do IVFs to help with dehydration/Azotemia in addition to AKI. Will do IVFs 60cc/hour x 24 hours and recheck BMP in AM.   -3/23 BUN and creatinine improved today to 42/1.59 after 24 hours of IV fluids, encourage oral fluid intake -3/24 continue to encourage oral fluids, recheck BMP tomorrow 3/25- labs pending- last Cr down to 1.59- BUN down to 42 from 57.  3/26- BUN up to 38 but Cr slightly better at 1.57- will recheck Thursday and if worse, will need more IVFs.  11. Acute on chronic anemia: Improved post transfusion 3 total units             --add iron supplement. Will con't to monitor  3/20- stable today- 9.1- better today- con't to monitor  -Recheck CBC tomorrow  3/26- Hb 8.7- overall stable 13. Low protein stores: Add juven.  14. Abnormal LFTs: Continue to monitor. Recheck in am.  15. Elevated BS: Question stress/infection related. Will check Hgb A1c  3/21- HbA1c 5.8- will stop CBGs.   16. GERD: Continue PPI (was better with Nexium) 17. Concern for bowel incontinence  3/21- not on any scheduled pain meds- will likely get backed up, but will encourage to be on scheduled Laxative  3/22- LBM yesterday per  pt- cannot find it documented, but maybe was with therapy?  3/23 last BM today, continent  3/25- LBM Wed/Thursday last week- usually goes 1-2x/week at home- normal for him- will give Miralax today- has been getting prn stool softeners but no laxatives- scared of incontinence. 3/26- no BM in 5-6 days- will give Sorbitol this afternoon after therapy-   18.  Hyponatremia: mild continue to monitor  -Recheck tomorrow  3/25- Na 133- con't to monitor     I spent a total of  52  minutes on total care today- >50%  coordination of care- due to  D/w head nurse/mgr about a few issues; also d/w nursing about bowels and prolonged time with pt pushing fluids- and review of labs- waiting on Lokelma- also assessed incision- and team conference to determine length of stay  LOS: 7 days A FACE TO FACE EVALUATION WAS PERFORMED  Ameah Chanda 03/20/2023, 8:45 AM

## 2023-03-20 NOTE — Progress Notes (Signed)
Patient ID: [name], male   DOB: March 20, 1951, 72 y.o.   MRN: [id number]  Met with pt to give him team conference update regarding goals of mod/I wheelchair level and CGA-ambulation. He feels he is doing well and is making progress. He is aware of the target discharge date of 4/1. Discussed home health recommended and if he or sister had a preference regarding agency he would like to use. His sister is coming later today or tomorrow will wait to see her and ask them both together. He agrees with ordering wheelchair and have make referral to [employer] for wheelchair. Continue to work on discharge needs.

## 2023-03-20 NOTE — Progress Notes (Signed)
Physical Therapy Session Note  Patient Details  Name: [name] MRN: [id number] Date of Birth: November 28, 1951  Today's Date: 03/20/2023 PT Individual Time: 0815-0900 PT Individual Time Calculation (min): 45 min   Short Term Goals: Week 1:  PT Short Term Goal 1 (Week 1): STG=LTG due to ELOS  Skilled Therapeutic Interventions/Progress Updates:    pt received in bed and agreeable to therapy. Pt reports pain levels controlled at this time. Pt dressed EOB with set up/supervision. Pt also able to apply lotion to LLE without LOB. Discussed performing skin checks to protect LLE, which pt is able to do without mirror. Pt able to problem solve donning sock as he is not able to figure 4. Assist only to don shoe for time so pt could use urinal in standing, CGA to find balance. Sit to stand with min A for first stand and fading to CGA rest of session. Pt then stood at sink to wash face, intermittent no UE support, but pt leaning on sink. Discussed that pt has been very emotional, but seems to be coming to terms with AKA, that he has recd call from deacon at his church that was helpful. Pt also calls his residual limb, "nubby" which seems to personify and help with coping. Pt propelled w/c with BUE to day room for endurance and functional mobility. Pt able to set up for transfer without cueing. Stand pivot transfer with CGA <>mat table. Sit<>supine and supine<>prone with supervision. Pt reports he has not gotten into prone position previously. Pt performed 2 x 5 hip extensions Bil for glute strength and contracture prevention. Provided education getting into prone position for stretching a few minutes daily. Pt returned to room and remained in w/c with all needs in reach.   Therapy Documentation Precautions:  Precautions Precautions: Fall Precaution Comments: phantom limb pain RLE Restrictions Weight Bearing Restrictions: Yes RLE Weight Bearing: Non weight bearing General:       Therapy/Group:  Individual Therapy  Mickel Fuchs 03/20/2023, 11:55 AM

## 2023-03-20 NOTE — Progress Notes (Signed)
Occupational Therapy Session Note  Patient Details  Name: [name] MRN: [id number] Date of Birth: [birth year]  Today's Date: 03/20/2023 OT Individual Time: [id number] OT Individual Time Calculation (min): 44 min    Short Term Goals: Week 1:  OT Short Term Goal 1 (Week 1): STGs=LTGs due to patient's length of stay.  Skilled Therapeutic Interventions/Progress Updates:   Pt seen for skilled OT sesison with focus on planning bathroom access for discharge. Pt transported in w/c for time mngt to and from room to demo apt space. Pt issued RW bag on walker for light item transported and simulated this session. Pt able to transfer off and on regular bed with close S via RW for 6 hop steps to and from w/c. Able to move to supine and sit with S. Pt then allowed OT to set up stall shower frame and simulate amb to bathroom doorway from w/c with RW. Able to perform 6-8 step/hops with RW to TTB in stall shower frame and perform transfer in and out with close S. Pt then transferred back to room. Pt feels confident with RW and DME he will access bathroom safely at home upon d/c and OT will continue to progress. Left pt with all safety needs, nurse call button in reach while up in w/c for next PT session.   Pain: 4/10 reported "phantom" sensation with meds administered, pt visualization and repositioning for relief.   Therapy Documentation Precautions:  Precautions Precautions: Fall Precaution Comments: phantom limb pain RLE Restrictions Weight Bearing Restrictions: Yes RLE Weight Bearing: Non weight bearing    Therapy/Group: Individual Therapy  [name] 03/20/2023, 7:40 AM

## 2023-03-20 NOTE — Progress Notes (Signed)
Pt LBM 3/23. Pt agreed to do miralax Sheela Stack, LPN

## 2023-03-20 NOTE — Progress Notes (Signed)
Physical Therapy Session Note  Patient Details  Name: [name] MRN: [id number] Date of Birth: 08/09/1951  Today's Date: 03/20/2023 PT Individual Time: 1000-1100, 1348- 1445  PT Individual Time Calculation (min): 60 min , 57 min   Short Term Goals: Week 1:  PT Short Term Goal 1 (Week 1): STG=LTG due to ELOS  Skilled Therapeutic Interventions/Progress Updates:      Therapy Documentation Precautions:  Precautions Precautions: Fall Precaution Comments: phantom limb pain RLE Restrictions Weight Bearing Restrictions: Yes RLE Weight Bearing: Non weight bearing  Treatment Session 1:   Pt received seated in w/c at bedside. Pt without reports of pain in session and states he often has phantom limb pain at 9 PM at night that is improved with self-desensitization techniques.   Pt engaged in discharge planning and reports his ramp to enter the front of his residence was built. Pt looking into ramp installation for back patio and PT provided pt with education on portable ramps that can be ordered online as potential solution. Pt reports his sister along with other family members and friends will provide intermittent support.   Pt educated on pressure relief techniques either by performing lateral lean's or seated w/c push up every 20-30 min x 2 minutes. PT encouraged pt to keep track with timer on phone and for patient to create a routine that also involves skin inspection. Pt educated to inspect skin daily of R residual limb and L LE with inspection mirror, provided by PT. Pt also educated regarding signs of circulation impairments.   Pt reports he feels confident in being able to direct his care and agreeable to perform car transfer. Pt performed ambulatory transfer with RW with supervision and requires CGA for ramp navigation.   Pt transported throughout session in w/c for time management and energy conservation, pt left seated in w/c at bedside with all needs in reach.   Treatment  Session 2:   Pt received seated in w/c at bedside, agreeable to PT session. PT called [employer] manager and given clearance to adjust shrinker length. PT cut pt's two shrinker's and provided min A to don.   Pt with unrated right incisional and phantom limb pain 6/10, pre-medicated.   Pt self-propelled w/c > 150 ft with supervision for safety to main gym. Pt performed following UE exercises with tidal tank for UE resistance and activity tolerance training:   -Chest Press 3 x 10   -OH Press 3 x 10   -Seated rows 3 x 10   Pt performed elbow extension with 6 # dumb bells 3 x 10 to carry over to better transfers and pressure relief.   Pt left seated in recliner at bedside with all needs in reach.    Therapy/Group: Individual Therapy  Verl Dicker Verl Dicker PT, DPT  03/20/2023, 7:56 AM

## 2023-03-21 NOTE — Progress Notes (Signed)
Patient ID: [name], male   DOB: 09-Apr-1951, 72 y.o.   MRN: [id number]  Met with sister and pt to ask sister preference regarding home health agency she had no preference. Will get an agency who accepts pt's insurance. Also gave sister co-pay for [employer] to call to pay then will deliver the wheelchair for pt. Work on discharge needs.

## 2023-03-21 NOTE — Progress Notes (Signed)
Physical Therapy Session Note  Patient Details  Name: [name] MRN: [id number] Date of Birth: 01/28/1951  Today's Date: 03/21/2023 PT Individual Time: 1010-1105 PT Individual Time Calculation (min): 55 min   Short Term Goals: Week 1:  PT Short Term Goal 1 (Week 1): STG=LTG due to ELOS  Skilled Therapeutic Interventions/Progress Updates:      Therapy Documentation Precautions:  Precautions Precautions: Fall Precaution Comments: phantom limb pain RLE Restrictions Weight Bearing Restrictions: Yes RLE Weight Bearing: Non weight bearing  Pt received seated in recliner at bedside, agreeable to PT session with an emphasis on w/c mobility and community integration. Pt without reports of pain in session.   Pt self-propelled w/c >500 ft with supervision for safety to first floor gift shop. Pt able to navigate doorways, narrow spaces and practice turning with wheelchair in a community setting with supervision.   Pt participated in gait training and ambulated ~30 ft with RW with hop to gait pattern with min verbal cues to decrease UE reliance on RW.   Pt requires supervision for dynamic standing balance with B UE support as therapist switched out soiled brief. Pt left seated in recliner at bedside with all needs in reach.   Therapy/Group: Individual Therapy  Verl Dicker Verl Dicker PT, DPT  03/21/2023, 7:44 AM

## 2023-03-21 NOTE — Progress Notes (Signed)
PROGRESS NOTE   Subjective/Complaints: Patient seen ambulating with therapy with RW CG Has continued phantom pain in RLE No new complaints expressed   ROS: denies SOB, abd pain, CP, N/V/C/D, and vision changes  Objective:   No results found. Recent Labs    03/19/23 1022  WBC 8.0  HGB 8.7*  HCT 28.0*  PLT 517*    Recent Labs    03/19/23 1022 03/20/23 0644  NA 133* 133*  K 5.2* 4.7  CL 98 99  CO2 24 23  GLUCOSE 108* 96  BUN 30* 38*  CREATININE 1.61* 1.57*  CALCIUM 9.2 9.0    Intake/Output Summary (Last 24 hours) at 03/21/2023 1943 Last data filed at 03/21/2023 1820 Gross per 24 hour  Intake 480 ml  Output 3125 ml  Net -2645 ml        Physical Exam: Vital Signs Blood pressure (!) 111/52, pulse 71, temperature 97.9 F (36.6 C), temperature source Oral, resp. rate 18, height 6' (1.829 m), weight 89.5 kg, SpO2 98 %.  Gen: no distress, normal appearing HEENT: oral mucosa pink and moist, NCAT Cardio: Reg rate Chest: normal effort, normal rate of breathing Abd: soft, non-distended Ext: no edema Psych: pleasant, normal affect Neurological: Ox3 R AKA- incision looks amazing- smaller dog ears- no drainage mild swelling-staples in place- popped blister with a little open skin- on R inner/medial thigh of R AKA   Musculoskeletal:     Cervical back: Neck supple. No tenderness.     Comments: R-AKA with multiple ecchymotic areas--question early blister as well as blister? inner thigh covered with foam dressing. However except ecchymoses, staples has blister inside of L AKA/medial aspect- covered with dressing- and large dressing over tip of AKA- still has dog ears.  UE strength 5-/5 throughout B/L LLE- 5-/5 in HF, KE, KF, DF and PF RLE- NT per pt request    Skin:    General: Skin is warm and dry.     Comments: IV R forearm looks OK Inner groin/thigh dressing from prior bypass R AKA incision per the MS   Neurological:     Mental Status: He is alert and oriented to person, place, and time.  Follows commands    Comments: Intact to light touch in LLE and RLE/AKA  Assessment/Plan: 1. Functional deficits which require 3+ hours per day of interdisciplinary therapy in a comprehensive inpatient rehab setting. Physiatrist is providing close team supervision and 24 hour management of active medical problems listed below. Physiatrist and rehab team continue to assess barriers to discharge/monitor patient progress toward functional and medical goals  Care Tool:  Bathing    Body parts bathed by patient: Right arm, Left arm, Chest, Abdomen, Front perineal area, Right upper leg, Left upper leg, Face, Left lower leg   Body parts bathed by helper: Buttocks Body parts n/a: Right lower leg   Bathing assist Assist Level: Minimal Assistance - Patient > 75%     Upper Body Dressing/Undressing Upper body dressing   What is the patient wearing?: Pull over shirt    Upper body assist Assist Level: Set up assist    Lower Body Dressing/Undressing Lower body dressing      What  is the patient wearing?: Incontinence brief, Pants     Lower body assist Assist for lower body dressing: Minimal Assistance - Patient > 75%     Toileting Toileting    Toileting assist Assist for toileting: Minimal Assistance - Patient > 75%     Transfers Chair/bed transfer  Transfers assist     Chair/bed transfer assist level: Contact Guard/Touching assist     Locomotion Ambulation   Ambulation assist   Ambulation activity did not occur: Safety/medical concerns (faigue, AKA)  Assist level: Contact Guard/Touching assist Assistive device: Walker-rolling Max distance: 5   Walk 10 feet activity   Assist  Walk 10 feet activity did not occur: Safety/medical concerns  Assist level: Contact Guard/Touching assist Assistive device: Walker-rolling   Walk 50 feet activity   Assist Walk 50 feet with 2 turns  activity did not occur: Safety/medical concerns  Assist level: Contact Guard/Touching assist Assistive device: Walker-rolling    Walk 150 feet activity   Assist Walk 150 feet activity did not occur: Safety/medical concerns         Walk 10 feet on uneven surface  activity   Assist Walk 10 feet on uneven surfaces activity did not occur: Safety/medical concerns (faitgue, AKA)         Wheelchair     Assist Is the patient using a wheelchair?: Yes Type of Wheelchair: Manual    Wheelchair assist level: Independent Max wheelchair distance: >150 ft    Wheelchair 50 feet with 2 turns activity    Assist        Assist Level: Independent   Wheelchair 150 feet activity     Assist      Assist Level: Independent   Blood pressure (!) 111/52, pulse 71, temperature 97.9 F (36.6 C), temperature source Oral, resp. rate 18, height 6' (1.829 m), weight 89.5 kg, SpO2 98 %.  Medical Problem List and Plan: 1. Functional deficits secondary to R AKA due to thrombosis of graft and lack of flow per ABI's.              -patient may  shower- cover incision             -ELOS/Goals: ~ 2 weeks supervision at w/c level  Continue CIR PT and OT- incision looks amazing- popped blister medial aspect of L AKA-  Team conference today to determine length of stay  - shrinkers ordered- came too big- per OT, reordered smaller size.  2.  Antithrombotics: -DVT/anticoagulation:  Pharmaceutical: Lovenox             -antiplatelet therapy: ASA.  3. Pain: continue Oxycodone prn. D/c dilaudid as not used for past 3 days             --add oxycontin for  more consistent relief. Flexeril prn for spasms. Pain has been uncontrolled, will attempt to get better control with long acting, and treating phantom pain             --reporting phantom pain also--may need to resume gabapentin.   3/20- will add Gabapentin 300 mg QHS  3/21- don't want to increase gabapentin due to Cr of 1.88- but encouraged  touch of R AKA for phantom pain- pain much better controlled on Scheduled long acting.   3/22- pain doing better- tolerable  3/23 reports pain is under control, has not had recent use of as needed oxycodone in last couple days  -3/24 Cr trending down, continue gabapentin 300 mg HS, add 100mg  in the AM for phantom  pain  3/26- pain much better- con't regimen 4. Anxiety: LCSW to follow for evaluation and support.             -antipsychotic agents: N/A.  --continue ativan prn for anxiety. Consulted Dr. Sima Matas to help with coping/anxiety/limb loss. Doesn't want to look at AKA; also lost long term GF since hospital admission- needs help coping/with grief             --Trazodone added for insomnia prn.  5. Neuropsych/cognition: This patient is capable of making decisions on his  own behalf.  3/22- has seen Neuropsych- doing better somewhat  6. Skin/Wound Care: Monitor wound for healing. Monitor for blistering.   7. Fluids/Electrolytes/Nutrition: Monitor I/O. Check CMET in am.              --Protein supplements added to promote wound healing.  8.  Leucocytosis/Fevers: WBC 18.4-->9.4/ due to RLE cellulitis now s/p AKA.              --Completed ceftriaxone X 5 days  --Resolving and will continue to monitor.  9. HTN: Monitor BP TID. Continue metoprolol --continue to hold prinzide due to AKI and as BP controlled.   3/21- BP controlled in spite of holding BP med- con't to monitor 3/26- BP doing well- con't regimen    03/21/2023    2:24 PM 03/21/2023    4:54 AM 03/20/2023    7:46 PM  Vitals with BMI  Systolic [id number]  Diastolic 52 62 63  Pulse 71 79 87    10. Acute on chronic renal failure: BUN/SCr trending down from 127/5.1-->38/1.87 --continue sodium bicarb as CO2  still elevated but improved 13-->21  3/20- Cr stable at 1.88- BUN very slightly higher at 41 from 36- and CO2 better at 22- con't to monitor 3/21- will recheck in AM- might need some more IVFs- will push fluids 3/22- Cr 1.77  and BUN 57- has continued to increase- was 36-40- will do IVFs to help with dehydration/Azotemia in addition to AKI. Will do IVFs 60cc/hour x 24 hours and recheck BMP in AM.   -3/23 BUN and creatinine improved today to 42/1.59 after 24 hours of IV fluids, encourage oral fluid intake -3/24 continue to encourage oral fluids, recheck BMP tomorrow 3/25- labs pending- last Cr down to 1.59- BUN down to 42 from 57.  3/26- BUN up to 38 but Cr slightly better at 1.57- will recheck Thursday and if worse, will need more IVFs.  11. Acute on chronic anemia: Improved post transfusion 3 total units             --add iron supplement. Will con't to monitor  3/20- stable today- 9.1- better today- con't to monitor  -Recheck CBC tomorrow  3/26- Hb 8.7- overall stable 13. Low protein stores: Add juven.  14. Abnormal LFTs: Continue to monitor. Recheck in am.  15. Elevated BS: Question stress/infection related. Will check Hgb A1c  3/21- HbA1c 5.8- will stop CBGs.   16. GERD: Continue PPI (was better with Nexium) 17. Concern for bowel incontinence  3/21- not on any scheduled pain meds- will likely get backed up, but will encourage to be on scheduled Laxative  3/22- LBM yesterday per pt- cannot find it documented, but maybe was with therapy?  3/23 last BM today, continent  3/25- LBM Wed/Thursday last week- usually goes 1-2x/week at home- normal for him- will give Miralax today- has been getting prn stool softeners but no laxatives- scared of incontinence. 3/26- no BM in 5-6  days- will give Sorbitol this afternoon after therapy-   18.  Hyponatremia: mild continue to monitor  3/25- Na 133- con't to monitor  LOS: 8 days A FACE TO FACE EVALUATION WAS PERFORMED  Izora Ribas 03/21/2023, 7:43 PM

## 2023-03-21 NOTE — Progress Notes (Signed)
Physical Therapy Session Note  Patient Details  Name: [name] MRN: [id number] Date of Birth: July 23, 1951  Today's Date: 03/21/2023 PT Individual Time: 0850-0934 PT Individual Time Calculation (min): 44 min   Short Term Goals: Week 1:  PT Short Term Goal 1 (Week 1): STG=LTG due to ELOS  Skilled Therapeutic Interventions/Progress Updates:    Pt presents in room in bed and agreeable to PT. Pt denies pain this session and reports having received pain medication prior to session. Pt requesting to complete some ADLs such as change into clothes with pt demonstrating bed mobility with distant supervision, set up assist for donning shirt in sitting EOB abd applying lotion to LLE with close supervision. Pt provided with shrinker and provided with min verbal cueing for donning technique for applying waist strap and managing shrinker to appropriate place on posterior residual limb to decrease rolling with pt demonstrating understanding. Pt dons pants in sitting to just below hips and dons sock and shoe while sitting with close supervision for sitting balance. Pt completes sit<>stand from EOB to RW with CGA with pt able to manage pants over hips with unilateral UE support with  therapist providing CGA for standing balance. Pt then ambulates 5' with hop to technique and RW CGA bed to WC. Pt then self propels WC with BUEs to ortho gym to propel up/down ramp to simulate home environment, requires verbal cueing for technique for up ramp with pt requiring CGA, CGA for descending ramp however pt demonstrating good control and slow speed. Pt then propels to main gym, manages breaks and leg rest without cueing for preparation for transfer. Pt completes sit<>stand transfer with CGA and ambulates 30' with RW CGA, pt provided with education on limiting ambulation distance to <50' to decrease excess strain on BUEs and LLE as well as maintain LLE alignment once pt initiates gait training with prosthesis with pt  verbalizing understanding. Pt then self propels back to room ~150' with verbal cueing for propulsion technique to reduce strain on shoulders (big pushes, let go of wheel to improve efficiency), pt demonstrating understanding however will benefit from continued reinforcement of technique. Pt completes stand step transfer WC to recliner with RW where he remains seated with all needs within reach, call light in place, and chair alarm activated at end of session.  Therapy Documentation Precautions:  Precautions Precautions: Fall Precaution Comments: phantom limb pain RLE Restrictions Weight Bearing Restrictions: Yes RLE Weight Bearing: Non weight bearing   Therapy/Group: Individual Therapy  Lorna Dibble 03/21/2023, 11:47 AM

## 2023-03-21 NOTE — Progress Notes (Signed)
Occupational Therapy Session Note  Patient Details  Name: [name] MRN: [id number] Date of Birth: May 20, 1951  Today's Date: 03/21/2023 OT Individual Time: 1305-1400 OT Individual Time Calculation (min): 55 min    Short Term Goals: Week 1:  OT Short Term Goal 1 (Week 1): STGs=LTGs due to patient's length of stay.  Skilled Therapeutic Interventions/Progress Updates:  Pt received sitting in recliner for skilled OT session with focus on BADL retraining. Pt agreeable to interventions, demonstrating overall pleasant mood. Pt reported 5/10 pain R residual limb, stating "I got medication this morning. . . ." OT offering intermediate rest breaks and positioning suggestions throughout session to address pain/fatigue and maximize participation/safety in session.  Pt completes hop-step transfer from recliner<>WC with close supervision + RW. Sitting in [place] at sink-side, pt shaves face with item-retrieval. Pt self-propels from room<>therapy gym with supervision. In therapy gym, pt participates in series of UE strengthening exercises, including: -Chest press -Overhead press -Bicep curls  Pt completes 2-3 sets/10 reps with 5# DB, requiring multimodal cuing for correct form.   Pt remained sitting in recliner with all immediate needs met at end of session. Pt continues to be appropriate for skilled OT intervention to promote further functional independence.   Therapy Documentation Precautions:  Precautions Precautions: Fall Precaution Comments: phantom limb pain RLE Restrictions Weight Bearing Restrictions: Yes RLE Weight Bearing: Non weight bearing   Therapy/Group: Individual Therapy  Maudie Mercury, OTR/L, MSOT  03/21/2023, 6:31 AM

## 2023-03-21 NOTE — Progress Notes (Signed)
Occupational Therapy Session Note  Patient Details  Name: [name] MRN: [id number] Date of Birth: 08/26/1951  Today's Date: 03/21/2023 OT Individual Time: 1106-1203 OT Individual Time Calculation (min): 57 min    Short Term Goals: Week 1:  OT Short Term Goal 1 (Week 1): STGs=LTGs due to patient's length of stay.  Skilled Therapeutic Interventions/Progress Updates:     Pt received sitting up in recliner presenting to be in good spirits and receptive to skilled OT session. Pt reporting mild phantom pain with Pt reporting he received pain medications prior to therapy session with OT offering rest breaks, repositioning, and therapeutic support for pain management. Beginning of session focused on d/c planning, discussing home set-up, and family member/friend supports at d/c. Pt family entering room shortly after OT arrival, offered to allow Pt's family to participate in session and complete informal family education, however family requesting not to participate in today's session. Pt completed stand pivot to wc supervision with improved, controlled decent moving from stand>sit during transfer. Pt propelled wc to ADL apartment >200 ft mod I for UB endurance and functional mobility training. Upon arriving to ADL apartment, Pt and OT discussed kitchen set-up to simulate home environment for simple meal prep simulation and kitchen mobility tasks. Provided education on safety consideration and modifications to increase Pt independence, success, and prevent injury. Pt receptive to education and able to identify 3 safety concerns following education. Facilitated Pt completing simulated kitchen tasks removing pots/pans from cabinets, cooking on stove top, and cooking with stove. Pt able to collaborate with OT to problem solve safest means for removing pans from oven without injury with Pt reporting he owns a reacher for the oven while he used to slide racks in/out. Pt able to transport water in pan using  hemi technique for wc propulsion while holding pant in RUE. Demonstrated safe use of stove top with increased education provided on burn prevention and modifications. Min cueing required during tasks to lock brakes brakes or for wc positioning to improve function/prevent injury. Pt propelled wc back to his room mod I. Stand pivot wc>recliner using RW distant supervision. Pt left resting in recliner with call bell in reach, chair alarm on, and all needs met with family present in room.    Therapy Documentation Precautions:  Precautions Precautions: Fall Precaution Comments: phantom limb pain RLE Restrictions Weight Bearing Restrictions: Yes RLE Weight Bearing: Non weight bearing   Vital Signs: Therapy Vitals Temp: 99 F (37.2 C) Temp Source: Oral Pulse Rate: 79 Resp: 16 BP: 109/62 Patient Position (if appropriate): Lying Oxygen Therapy SpO2: 96 % O2 Device: Room Air   Therapy/Group: Individual Therapy  Janey Genta 03/21/2023, 7:53 AM

## 2023-03-22 LAB — BASIC METABOLIC PANEL
Anion gap: 10 (ref 5–15)
BUN: 30 mg/dL — ABNORMAL HIGH (ref 8–23)
CO2: 25 mmol/L (ref 22–32)
Calcium: 9.1 mg/dL (ref 8.9–10.3)
Chloride: 100 mmol/L (ref 98–111)
Creatinine, Ser: 1.52 mg/dL — ABNORMAL HIGH (ref 0.61–1.24)
GFR, Estimated: 48 mL/min — ABNORMAL LOW (ref >60)
Glucose, Bld: 106 mg/dL — ABNORMAL HIGH (ref 70–99)
Potassium: 4.1 mmol/L (ref 3.5–5.1)
Sodium: 135 mmol/L (ref 135–145)

## 2023-03-22 MED ORDER — SENNOSIDES-DOCUSATE SODIUM 8.6-50 MG PO TABS
1.0000 | ORAL_TABLET | Freq: Every day | ORAL | Status: DC
  Administered 2023-03-22 – 2023-03-23 (×2): 1 via ORAL
  Filled 2023-03-22 (×2): qty 1

## 2023-03-22 NOTE — Progress Notes (Signed)
PROGRESS NOTE   Subjective/Complaints: No new complaints this morning Seen walking with therapy Appreciate SW getting home services for him  ROS: Denies SOB, abd pain, CP, N/V/C/D, and vision changes  Objective:   No results found. No results for input(s): "WBC", "HGB", "HCT", "PLT" in the last 72 hours.   Recent Labs    03/20/23 0644 03/22/23 0629  NA 133* 135  K 4.7 4.1  CL 99 100  CO2 23 25  GLUCOSE 96 106*  BUN 38* 30*  CREATININE 1.57* 1.52*  CALCIUM 9.0 9.1    Intake/Output Summary (Last 24 hours) at 03/22/2023 1139 Last data filed at 03/22/2023 0700 Gross per 24 hour  Intake 480 ml  Output 1350 ml  Net -870 ml        Physical Exam: Vital Signs Blood pressure 111/60, pulse 82, temperature 99.1 F (37.3 C), temperature source Oral, resp. rate 17, height 6' (1.829 m), weight 89.5 kg, SpO2 100 %.  Gen: no distress, normal appearing HEENT: oral mucosa pink and moist, NCAT Cardio: Reg rate Chest: normal effort, normal rate of breathing Abd: soft, non-distended Ext: no edema Psych: pleasant, normal affect Skin: intact R AKA- incision looks amazing- smaller dog ears- no drainage mild swelling-staples in place- popped blister with a little open skin- on R inner/medial thigh of R AKA   Musculoskeletal:     Cervical back: Neck supple. No tenderness.     Comments: R-AKA with multiple ecchymotic areas--question early blister as well as blister? inner thigh covered with foam dressing. However except ecchymoses, staples has blister inside of L AKA/medial aspect- covered with dressing- and large dressing over tip of AKA- still has dog ears.  UE strength 5-/5 throughout B/L LLE- 5-/5 in HF, KE, KF, DF and PF RLE- NT per pt request    Skin:    General: Skin is warm and dry.     Comments: IV R forearm looks OK Inner groin/thigh dressing from prior bypass R AKA incision per the MS  Neurological:     Mental  Status: He is alert and oriented to person, place, and time.  Follows commands    Comments: Intact to light touch in LLE and RLE/AKA  Assessment/Plan: 1. Functional deficits which require 3+ hours per day of interdisciplinary therapy in a comprehensive inpatient rehab setting. Physiatrist is providing close team supervision and 24 hour management of active medical problems listed below. Physiatrist and rehab team continue to assess barriers to discharge/monitor patient progress toward functional and medical goals  Care Tool:  Bathing    Body parts bathed by patient: Right arm, Left arm, Chest, Abdomen, Front perineal area, Right upper leg, Left upper leg, Face, Left lower leg   Body parts bathed by helper: Buttocks Body parts n/a: Right lower leg   Bathing assist Assist Level: Minimal Assistance - Patient > 75%     Upper Body Dressing/Undressing Upper body dressing   What is the patient wearing?: Pull over shirt    Upper body assist Assist Level: Set up assist    Lower Body Dressing/Undressing Lower body dressing      What is the patient wearing?: Incontinence brief, Pants  Lower body assist Assist for lower body dressing: Minimal Assistance - Patient > 75%     Toileting Toileting    Toileting assist Assist for toileting: Minimal Assistance - Patient > 75%     Transfers Chair/bed transfer  Transfers assist     Chair/bed transfer assist level: Contact Guard/Touching assist     Locomotion Ambulation   Ambulation assist   Ambulation activity did not occur: Safety/medical concerns (faigue, AKA)  Assist level: Contact Guard/Touching assist Assistive device: Walker-rolling Max distance: 5   Walk 10 feet activity   Assist  Walk 10 feet activity did not occur: Safety/medical concerns  Assist level: Contact Guard/Touching assist Assistive device: Walker-rolling   Walk 50 feet activity   Assist Walk 50 feet with 2 turns activity did not occur:  Safety/medical concerns  Assist level: Contact Guard/Touching assist Assistive device: Walker-rolling    Walk 150 feet activity   Assist Walk 150 feet activity did not occur: Safety/medical concerns         Walk 10 feet on uneven surface  activity   Assist Walk 10 feet on uneven surfaces activity did not occur: Safety/medical concerns (faitgue, AKA)         Wheelchair     Assist Is the patient using a wheelchair?: Yes Type of Wheelchair: Manual    Wheelchair assist level: Independent Max wheelchair distance: >150 ft    Wheelchair 50 feet with 2 turns activity    Assist        Assist Level: Independent   Wheelchair 150 feet activity     Assist      Assist Level: Independent   Blood pressure 111/60, pulse 82, temperature 99.1 F (37.3 C), temperature source Oral, resp. rate 17, height 6' (1.829 m), weight 89.5 kg, SpO2 100 %.  Medical Problem List and Plan: 1. Functional deficits secondary to R AKA due to thrombosis of graft and lack of flow per ABI's.              -patient may  shower- cover incision             -ELOS/Goals: ~ 2 weeks supervision at w/c level  Continue CIR PT and OT- incision looks amazing- popped blister medial aspect of L AKA-  Team conference today to determine length of stay  - shrinkers ordered- came too big- per OT, reordered smaller size.  2.  Antithrombotics: -DVT/anticoagulation:  Pharmaceutical: Lovenox             -antiplatelet therapy: ASA.  3. Pain: continue Oxycodone prn. D/c dilaudid as not used for past 3 days             --add oxycontin for  more consistent relief. Flexeril prn for spasms. Pain has been uncontrolled, will attempt to get better control with long acting, and treating phantom pain             --reporting phantom pain also--may need to resume gabapentin.   3/20- will add Gabapentin 300 mg QHS  3/21- don't want to increase gabapentin due to Cr of 1.88- but encouraged touch of R AKA for phantom  pain- pain much better controlled on Scheduled long acting.   3/22- pain doing better- tolerable  3/23 reports pain is under control, has not had recent use of as needed oxycodone in last couple days  -3/24 Cr trending down, continue gabapentin 300 mg HS, add 100mg  in the AM for phantom pain  3/26- pain much better- con't regimen 4. Anxiety: LCSW to  follow for evaluation and support.             -antipsychotic agents: N/A.  --continue ativan prn for anxiety. Consulted Dr. Sima Matas to help with coping/anxiety/limb loss. Doesn't want to look at AKA; also lost long term GF since hospital admission- needs help coping/with grief             --Trazodone added for insomnia prn.  5. Neuropsych/cognition: This patient is capable of making decisions on his  own behalf.  3/22- has seen Neuropsych- doing better somewhat  6. Skin/Wound Care: Monitor wound for healing. Monitor for blistering.   7. Fluids/Electrolytes/Nutrition: Monitor I/O. Check CMET in am.              --Protein supplements added to promote wound healing.  8.  Leucocytosis/Fevers: WBC 18.4-->9.4/ due to RLE cellulitis now s/p AKA.              --Completed ceftriaxone X 5 days  --Resolving and will continue to monitor.  9. HTN: Monitor BP TID. Continue metoprolol --continue to hold prinzide due to AKI and as BP controlled.   3/21- BP controlled in spite of holding BP med- con't to monitor 3/26- BP doing well- con't regimen    03/22/2023    5:26 AM 03/21/2023    8:37 PM 03/21/2023    2:24 PM  Vitals with BMI  Systolic [id number]  Diastolic 60 72 52  Pulse 82 83 71    10. Acute on chronic renal failure: BUN/SCr trending down from 127/5.1-->38/1.87 --continue sodium bicarb as CO2  still elevated but improved 13-->21  3/20- Cr stable at 1.88- BUN very slightly higher at 41 from 36- and CO2 better at 22- con't to monitor 3/21- will recheck in AM- might need some more IVFs- will push fluids 3/22- Cr 1.77 and BUN 57- has continued to  increase- was 36-40- will do IVFs to help with dehydration/Azotemia in addition to AKI. Will do IVFs 60cc/hour x 24 hours and recheck BMP in AM.   -3/23 BUN and creatinine improved today to 42/1.59 after 24 hours of IV fluids, encourage oral fluid intake -3/24 continue to encourage oral fluids, recheck BMP tomorrow 3/25- labs pending- last Cr down to 1.59- BUN down to 42 from 57.  3/26- BUN up to 38 but Cr slightly better at 1.57- will recheck Thursday and if worse, will need more IVFs.  11. Acute on chronic anemia: Improved post transfusion 3 total units             --add iron supplement. Will con't to monitor  3/20- stable today- 9.1- better today- con't to monitor  -Recheck CBC tomorrow  3/26- Hb 8.7- overall stable 13. Low protein stores: Add juven.  14. Abnormal LFTs: Continue to monitor. Recheck in am.  15. Prediabetes: provide dietary education   16. GERD: continue PPI (was better with Nexium) 17. Concern for bowel incontinence  3/21- not on any scheduled pain meds- will likely get backed up, but will encourage to be on scheduled Laxative  3/22- LBM yesterday per pt- cannot find it documented, but maybe was with therapy?  3/23 last BM today, continent  3/25- LBM Wed/Thursday last week- usually goes 1-2x/week at home- normal for him- will give Miralax today- has been getting prn stool softeners but no laxatives- scared of incontinence. 3/26- no BM in 5-6 days- will give Sorbitol this afternoon after therapy-   3/28: had type 6 stool today: decrease senna-docusate to 1 tab HS 18.  Hyponatremia: resolved, monitor outpatient.   LOS: 9 days A FACE TO FACE EVALUATION WAS PERFORMED  Clide Deutscher Sorah Falkenstein 03/22/2023, 11:39 AM

## 2023-03-22 NOTE — Progress Notes (Signed)
Occupational Therapy Weekly Progress Note  Patient Details  Name: [name] MRN: [id number] Date of Birth: 1951/04/25  Beginning of progress report period: March 14, 2023 End of progress report period: March 22, 2023  Today's Date: 03/22/2023 OT Individual Time: [id number] OT Individual Time Calculation (min): 56 min   Today's Date: 03/22/2023 OT Individual Time: [id number] OT Individual Time Calculation (min): 40 min   Patient making steady progress towards LTGs this reporting period. Pt currently mod I for UB ADLS and sink-side grooming, requiring SUP-CGA for LB dressing and toileting. Pt issued shinker for residual limb shaping, able to don with item-retrieval. Caregiver education to be planned closer to discharge.   Patient continues to demonstrate the following deficits: muscle weakness and decreased cardiorespiratoy endurance and therefore will continue to benefit from skilled OT intervention to enhance overall performance with BADL and iADL.  Patient progressing toward long term goals..  Continue plan of care.  OT Short Term Goals Week 2:  OT Short Term Goal 1 (Week 2): STGs=LTGs due to patient's length of stay.  Skilled Therapeutic Interventions/Progress Updates:  Session 1: Pt received sitting in recliner for skilled OT session with focus on LB dressing, functional mobility, and dynamic standing balance. Pt agreeable to interventions, demonstrating overall pleasant mood. Pt with un-rated pain in residual limb, stating "it's hurting in the incision. . . ." OT offering intermediate rest breaks and positioning suggestions throughout session to address pain/fatigue and maximize participation/safety in session.  Pt completes incontinence pad change with overall CGA + RW. Pt stands to doff LB garments over bottom/hips, sitting to change pad and adjust underwear/shinker, standing to don shorts over bottom/hips. Pt performs hop-step transfer from EOB<>WC<>EOM, placed ~5 feet away,  with overall CGA-SUP + RW. Pt propel WC to therapy locations with supervision.   In therapy gym, pt participates in dynamic balance activities targeting house-hold level thresholds with use of 3-inch step. Pt completes x10 repetitions of hop onto/off of step with overall CGA + RW. Pt then reaches/retrieves resistive clips from area outside base of support, pt completing x2 trials of activity, requiring up to [place] to maintain standing balance.   Pt remained sitting in recliner with all immediate needs met at end of session. Pt continues to be appropriate for skilled OT intervention to promote further functional independence.   Session 2: Pt received sitting in recliner for skilled OT session with focus on residual limb strengthening/ROM. Pt agreeable to interventions, demonstrating overall pleasant mood. Pt reported 5/10 pain, stating "it's mostly that phantom pain."  OT offering intermediate rest breaks and positioning suggestions throughout session to address pain/fatigue and maximize participation/safety in session.   Pt dependent for WC transport from room<>ortho gym due to increased fatigue and for energy conservation. In ortho gym, pt participates in series of R residual limb ROM/strengthening exercises, including: -Hip flexion -Hip extension (using rolled towel as support) -Hip abduction & adduction (including side-lying)  -Hamstring stretch  Pt completes 1 set/5-10 reps of each exercise, requiring verbal cuing for correct form.   Pt remained sitting in recliner with all immediate needs met at end of session. Pt continues to be appropriate for skilled OT intervention to promote further functional independence.   Therapy Documentation Precautions:  Precautions Precautions: Fall Precaution Comments: phantom limb pain RLE Restrictions Weight Bearing Restrictions: Yes RLE Weight Bearing: Non weight bearing   Therapy/Group: Individual Therapy  Maudie Mercury, OTR/L,  MSOT  03/22/2023, 6:32 AM

## 2023-03-22 NOTE — Progress Notes (Signed)
Patient ID: [name], male   DOB: [birth year], 72 y.o.   MRN: [id number]  Met with pt to update him regarding home health agency who has accepted his referral. [facility] will be providing PT & OT services at home. Wheelchair to be delivered to room prior to discharge.

## 2023-03-22 NOTE — Progress Notes (Signed)
Physical Therapy Session Note  Patient Details  Name: [name] MRN: [id number] Date of Birth: Apr 20, 1951  Today's Date: 03/22/2023 PT Individual Time: [id number], 1445-1530 PT Individual Time Calculation (min): 57 min 45 min   Short Term Goals: Week 1:  PT Short Term Goal 1 (Week 1): STG=LTG due to ELOS  Skilled Therapeutic Interventions/Progress Updates:      Therapy Documentation Precautions:  Precautions Precautions: Fall Precaution Comments: phantom limb pain RLE Restrictions Weight Bearing Restrictions: Yes RLE Weight Bearing: Non weight bearing  Treatment Session 1:   Pt received semi-reclined in bed, agreeable to PT session and without reports of pain in session.   Pt requires set-up for upper/lower body dressing at edge of bed with increased time 2/2 fatigue. Pt supervision for ambulatory transfer with RW to pt's personal w/c and pt performed self-care tasks at sink with supervision in seated position.   Pt propelled w/c > 300 ft with supervision throughout session. Pt requires supervision for gait x 32 ft with RW with decreased left foot clearance with fatigue and PT provided min verbal cues.   Pt transitioned to LE strength training exercises and performed L LE hip flexion and SLR 3 x 10 with 4 # ankle weight to address strength deficits.   Pt left seated in recliner at bedside with all needs in reach.    Treatment Session 2:   Pt received seated in recliner and agreeable to PT session. Pt without reports of pain in session and supervision for stand pivot to manual w/c and propelled w/c >500 ft with supervision for safety throughout hospital and community setting on first floor of hospital. Pt transitioned to upper extremity strength training exercises to better transfers and gait. Pt utilized bilateral 5 # dumb bells for shoulder abduction ( 3 x 6), scapular retraction ( 3 x 6) and alternating OH press ( 2 x 5). Pt returned to room by w/c propulsion and left  seated in w/c at bedside with all needs in reach and chair alarm on.   Therapy/Group: Individual Therapy  Verl Dicker Verl Dicker PT, DPT  03/22/2023, 7:45 AM

## 2023-03-23 LAB — OCCULT BLOOD X 1 CARD TO LAB, STOOL: Fecal Occult Bld: NEGATIVE

## 2023-03-23 NOTE — Progress Notes (Signed)
PROGRESS NOTE   Subjective/Complaints:  No issues overnite, still has phantom pain but not severe does not want gabapentin dose adjusted   ROS: Denies SOB, abd pain, CP, N/V/C/D, and vision changes  Objective:   No results found. No results for input(s): "WBC", "HGB", "HCT", "PLT" in the last 72 hours.   Recent Labs    03/22/23 0629  NA 135  K 4.1  CL 100  CO2 25  GLUCOSE 106*  BUN 30*  CREATININE 1.52*  CALCIUM 9.1     Intake/Output Summary (Last 24 hours) at 03/23/2023 0911 Last data filed at 03/23/2023 [id number] Gross per 24 hour  Intake 1314 ml  Output 1150 ml  Net 164 ml         Physical Exam: Vital Signs Blood pressure (!) 116/57, pulse 80, temperature 98.5 F (36.9 C), temperature source Oral, resp. rate 18, height 6' (1.829 m), weight 89.5 kg, SpO2 98 %.   General: No acute distress Mood and affect are appropriate Heart: Regular rate and rhythm no rubs murmurs or extra sounds Lungs: Clear to auscultation, breathing unlabored, no rales or wheezes Abdomen: Positive bowel sounds, soft nontender to palpation, nondistended Extremities: No clubbing, cyanosis, or edema Skin: No evidence of breakdown, no evidence of rash Neurologic: Cranial nerves II through XII intact, motor strength is 5/5 in bilateral deltoid, bicep, tricep, grip, hip flexor, knee extensors, ankle dorsiflexor and plantar flexor Sensory exam normal sensation to light touch and proprioception in bilateral upper and lower extremities Cerebellar exam normal finger to nose to finger as well as heel to shin in bilateral upper and lower extremities Musculoskeletal: Full range of motion in all 4 extremities. No joint swelling, RIght AKA - stump shrinker on pt dressed for OT about to start     Skin:    General: Skin is warm and dry.     Comments: IV R forearm looks OK Inner groin/thigh dressing from prior bypass R AKA incision per the MS     Assessment/Plan: 1. Functional deficits which require 3+ hours per day of interdisciplinary therapy in a comprehensive inpatient rehab setting. Physiatrist is providing close team supervision and 24 hour management of active medical problems listed below. Physiatrist and rehab team continue to assess barriers to discharge/monitor patient progress toward functional and medical goals  Care Tool:  Bathing    Body parts bathed by patient: Right arm, Left arm, Chest, Abdomen, Front perineal area, Right upper leg, Left upper leg, Face, Left lower leg   Body parts bathed by helper: Buttocks Body parts n/a: Right lower leg   Bathing assist Assist Level: Minimal Assistance - Patient > 75%     Upper Body Dressing/Undressing Upper body dressing   What is the patient wearing?: Pull over shirt    Upper body assist Assist Level: Set up assist    Lower Body Dressing/Undressing Lower body dressing      What is the patient wearing?: Incontinence brief, Pants     Lower body assist Assist for lower body dressing: Minimal Assistance - Patient > 75%     Toileting Toileting    Toileting assist Assist for toileting: Minimal Assistance - Patient > 75%  Transfers Chair/bed transfer  Transfers assist     Chair/bed transfer assist level: Contact Guard/Touching assist     Locomotion Ambulation   Ambulation assist   Ambulation activity did not occur: Safety/medical concerns (faigue, AKA)  Assist level: Contact Guard/Touching assist Assistive device: Walker-rolling Max distance: 5   Walk 10 feet activity   Assist  Walk 10 feet activity did not occur: Safety/medical concerns  Assist level: Contact Guard/Touching assist Assistive device: Walker-rolling   Walk 50 feet activity   Assist Walk 50 feet with 2 turns activity did not occur: Safety/medical concerns  Assist level: Contact Guard/Touching assist Assistive device: Walker-rolling    Walk 150 feet  activity   Assist Walk 150 feet activity did not occur: Safety/medical concerns         Walk 10 feet on uneven surface  activity   Assist Walk 10 feet on uneven surfaces activity did not occur: Safety/medical concerns (faitgue, AKA)         Wheelchair     Assist Is the patient using a wheelchair?: Yes Type of Wheelchair: Manual    Wheelchair assist level: Independent Max wheelchair distance: >150 ft    Wheelchair 50 feet with 2 turns activity    Assist        Assist Level: Independent   Wheelchair 150 feet activity     Assist      Assist Level: Independent   Blood pressure (!) 116/57, pulse 80, temperature 98.5 F (36.9 C), temperature source Oral, resp. rate 18, height 6' (1.829 m), weight 89.5 kg, SpO2 98 %.  Medical Problem List and Plan: 1. Functional deficits secondary to R AKA 03/05/23 due to PAD ( thrombosis of graft and lack of flow per ABI's. )             -patient may  shower- cover incision             -ELOS/Goals: ~ 2 weeks supervision at w/c level  Continue CIR PT and OT-  2.  Antithrombotics: -DVT/anticoagulation:  Pharmaceutical: Lovenox             -antiplatelet therapy: ASA.  3. Pain: continue Oxycodone prn. D/c dilaudid as not used for past 3 days             --add oxycontin for  more consistent relief. Flexeril prn for spasms. Pain has been uncontrolled, will attempt to get better control with long acting, and treating phantom pain             --reporting phantom pain also--may need to resume gabapentin.   3/20- will add Gabapentin 300 mg QHS  3/21- don't want to increase gabapentin due to Cr of 1.88- but encouraged touch of R AKA for phantom pain- pain much better controlled on Scheduled long acting.   3/22- pain doing better- tolerable  3/23 reports pain is under control, has not had recent use of as needed oxycodone in last couple days  -3/24 Cr trending down, continue gabapentin 300 mg HS, add 100mg  in the AM for phantom  pain  3/29- phantom pain (GFR 48) max rec gabapentin dose 700mg  q12h  4. Anxiety: LCSW to follow for evaluation and support.             -antipsychotic agents: N/A.  --continue ativan prn for anxiety. Consulted Dr. Sima Matas to help with coping/anxiety/limb loss. Doesn't want to look at AKA; also lost long term GF since hospital admission- needs help coping/with grief             --  Trazodone added for insomnia prn.  5. Neuropsych/cognition: This patient is capable of making decisions on his  own behalf.  3/22- has seen Neuropsych- doing better somewhat  6. Skin/Wound Care: Monitor wound for healing. Monitor for blistering.   7. Fluids/Electrolytes/Nutrition: Monitor I/O. Check CMET in am.              --Protein supplements added to promote wound healing.  8.  Leucocytosis/Fevers: WBC 18.4-->9.4/ due to RLE cellulitis now s/p AKA.              --Completed ceftriaxone X 5 days  --Resolving and will continue to monitor.  9. HTN: Monitor BP TID. Continue metoprolol --continue to hold prinzide due to AKI and as BP controlled.   3/21- BP controlled in spite of holding BP med- con't to monitor     03/23/2023    4:00 AM 03/22/2023    8:01 PM 03/22/2023    1:53 PM  Vitals with BMI  Systolic [id number]  Diastolic 57 66 59  Pulse 80 78 78   3/29 BP controlled  10. Acute on chronic renal failure: BUN/SCr trending down from 127/5.1-->38/1.87 --continue sodium bicarb as CO2  still elevated but improved 13-->21  3/20- Cr stable at 1.88- BUN very slightly higher at 41 from 36- and CO2 better at 22- con't to monitor 3/21- will recheck in AM- might need some more IVFs- will push fluids 3/22- Cr 1.77 and BUN 57- has continued to increase- was 36-40- will do IVFs to help with dehydration/Azotemia in addition to AKI. Will do IVFs 60cc/hour x 24 hours and recheck BMP in AM.   -3/23 BUN and creatinine improved today to 42/1.59 after 24 hours of IV fluids, encourage oral fluid intake -3/24 continue to  encourage oral fluids, recheck BMP tomorrow 3/25- labs pending- last Cr down to 1.59- BUN down to 42 from 57.  3/26- BUN up to 38 but Cr slightly better at 1.57- will recheck Thursday and if worse, will need more IVFs.  3/29 BUN down to 30 yesterday cont to enc po fluids  11. Acute on chronic anemia: Improved post transfusion 3 total units             --add iron supplement. Will con't to monitor  3/20- stable today- 9.1- better today- con't to monitor  -Recheck CBC tomorrow  3/26- Hb 8.7- overall stable 13. Low protein stores: Add juven.  14. Abnormal LFTs: Continue to monitor. Recheck in am.  15. Prediabetes: provide dietary education   16. GERD: continue PPI (was better with Nexium) 17. Concern for bowel incontinence  3/21- not on any scheduled pain meds- will likely get backed up, but will encourage to be on scheduled Laxative  3/22- LBM yesterday per pt- cannot find it documented, but maybe was with therapy?  3/23 last BM today, continent  3/25- LBM Wed/Thursday last week- usually goes 1-2x/week at home- normal for him- will give Miralax today- has been getting prn stool softeners but no laxatives- scared of incontinence. 3/26- no BM in 5-6 days- will give Sorbitol this afternoon after therapy-   3/28: had type 6 stool today: decrease senna-docusate to 1 tab HS 18.  Hyponatremia: resolved, monitor outpatient.   LOS: 10 days A FACE TO FACE EVALUATION WAS PERFORMED  Charlett Blake 03/23/2023, 9:11 AM

## 2023-03-23 NOTE — Progress Notes (Signed)
Occupational Therapy Session Note  Patient Details  Name: [name] MRN: [id number] Date of Birth: 08-Aug-1951  Today's Date: 03/23/2023 OT Individual Time: [id number] OT Individual Time Calculation (min): 72 min    Short Term Goals: Week 1:  OT Short Term Goal 1 (Week 1): STGs=LTGs due to patient's length of stay. OT Short Term Goal 1 - Progress (Week 1): Progressing toward goal  Skilled Therapeutic Interventions/Progress Updates:    OT session focused on BUE strength, functional transfers, functional endurance, and IADLs. Pt received sitting in recliner with OT discussing discharge planning. Pt propelled self to ADL apartment to promote BUE strength/endurance. Discussed setup of kitchen and energy conservation techniques. Practiced retrieving pot from low cabinet, filling with water, and placing on stove top from w/c with supervision. Completed sit<>stand at countertops to remove things from overhead cabinets with SBA. Practiced transfer to recliner with OT assisting to problem solve set up. Pt required min A to complete sit>stand from recliner. Pt propelled self back to room and transferred to recliner via stand pivot with CGA. Pt requiring brief change as he managed pants down with supervision and use of RW for balance then OT provided assist to don brief and mod A to pull pants up. Pt left sitting in recliner with all needs in reach.   Therapy Documentation Precautions:  Precautions Precautions: Fall Precaution Comments: phantom limb pain RLE Restrictions Weight Bearing Restrictions: Yes RLE Weight Bearing: Non weight bearing General:   Vital Signs:  Pain: Pain Assessment Pain Score: 4  ADL: ADL Eating: Modified independent Where Assessed-Eating: Bed level Grooming: Setup Where Assessed-Grooming: Edge of bed Upper Body Bathing: Setup Where Assessed-Upper Body Bathing: Chair Lower Body Bathing: Minimal assistance Where Assessed-Lower Body Bathing: Chair Upper Body  Dressing: Setup Where Assessed-Upper Body Dressing: Chair Lower Body Dressing: Minimal assistance Where Assessed-Lower Body Dressing: Chair Toileting: Contact guard Where Assessed-Toileting: Bedside Commode Toilet Transfer: Minimal assistance Toilet Transfer Method: Stand pivot Toilet Transfer Equipment: Geophysical data processor Method: Radiographer, therapeutic: Educational psychologist   Balance   Exercises:   Other Treatments:     Therapy/Group: Individual Therapy  [name] 03/23/2023, 10:38 AM

## 2023-03-23 NOTE — Progress Notes (Signed)
Occupational Therapy Session Note  Patient Details  Name: [name] MRN: [id number] Date of Birth: 07/05/1951  Today's Date: 03/23/2023 OT Individual Time: 1300-1355 OT Individual Time Calculation (min): 55 min    Short Term Goals: Week 2:  OT Short Term Goal 1 (Week 2): STGs=LTGs due to patient's length of stay.  Skilled Therapeutic Interventions/Progress Updates:    OT intervention with focus on w/c mobility, standing balance, funcitonal amb with RW, furniture transfers, safety awareness, and activity tolerance to increase independence with BADLs. Pt propelled w/c to day room. Corn hole standing using LUE to toss bean bags. No LOB noted. Pt amb with RW and retrieved bean bags from floor using reacher. No LOB noted. Pt stood at table and used BUE to clean bean bags with rest break X 1. Minimal UE support when completing task. Pt propelled w/c to ADL apt and practiced furniture transfers from couch. Reviewed home safety recommendations and energy conservation strategies. Pr propelled w/c back to room and amb with RW from doorway to recliner. Pt remained in recliner. Seat alarm activated. All needs within reach.   Therapy Documentation Precautions:  Precautions Precautions: Fall Precaution Comments: phantom limb pain RLE Restrictions Weight Bearing Restrictions: Yes RLE Weight Bearing: Non weight bearing  Pain:  Pt denies pain this afternoon  Therapy/Group: Individual Therapy  Leroy Libman 03/23/2023, 1:55 PM

## 2023-03-23 NOTE — Progress Notes (Signed)
Physical Therapy Session Note  Patient Details  Name: [name] MRN: [id number] Date of Birth: [birth year]  Today's Date: 03/23/2023 PT Individual Time: 0804 - 903 PT Individual Calculation (min): 59 min     Skilled Therapeutic Interventions/Progress Updates: Patient supine in bed on entrance to room. Patient alert and agreeable to PT session.   Patient reported 7/10 pain on R residual limb. Rest breaks given when needed. Attending staff provided medication during treatment. Reports of phantom limb pain on R residual limb and patient stated continuance of desensitizing, but that feelings of pain still occur.   Therapeutic Activity: Bed Mobility: Pt performed supine to sit on EOB and demonstrated mod I.  Transfers: Pt performed sit to stand and ambulated transfers to [facility] and hi/low mat with RW throughout session with supervision. Hospital bed in pt room required to be slightly elevated 2/2 patient report of it being still early in the morning and needed to warm up L LE. Hi/low mat was slightly elevated as well in day room.  Patient donned shrink sock on R residual limb, L shoe, personal shorts and shirt while on EOB and demonstrated independence with no requirements of vc. Pt. Was able to  maintain dynamic sitting balance during this activity. Pt required total A to don L personal sock for EOB safety.   Wheelchair Mobility:  Pt propelled wheelchair 200'+ feet with supervision for safety from pt room to [place] hallway, and to day room for mat activities. Provided vc and education for energy conservation as patient required multiple rest breaks. Pt likes to propell fast in order to "use momentum" to make it easier as current [facility] "feels still" per patient report due to it being new.  Neuromuscular Re-ed: Mirror therapy on hi/low mat performed today. Mirrors required to be elevated on yoga blocks in order to reach desired image. Pt instructed to write the alphabet and make shapes with L LE.  Education was provided as to why this intervention helps with phantom limb pain.  Patient transported back to room in [facility] and left in recliner at end of session with brakes locked, chair alarm set, and all needs within reach.      Therapy Documentation Precautions:  Precautions Precautions: Fall Precaution Comments: phantom limb pain RLE Restrictions Weight Bearing Restrictions: Yes RLE Weight Bearing: Non weight bearing  Therapy/Group: Individual Therapy  Shanette Tamargo, PTA 03/23/2023, 4:13 PM

## 2023-03-24 NOTE — Progress Notes (Signed)
PROGRESS NOTE   Subjective/Complaints: +right LE phantom limb pain No new complaints Excited to go home on Monday Family is at bedside  ROS: Denies SOB, abd pain, CP, N/V/C/D, and vision changes  Objective:   No results found. No results for input(s): "WBC", "HGB", "HCT", "PLT" in the last 72 hours.   Recent Labs    03/22/23 0629  NA 135  K 4.1  CL 100  CO2 25  GLUCOSE 106*  BUN 30*  CREATININE 1.52*  CALCIUM 9.1    Intake/Output Summary (Last 24 hours) at 03/24/2023 2024 Last data filed at 03/24/2023 1900 Gross per 24 hour  Intake 692 ml  Output 2125 ml  Net -1433 ml        Physical Exam: Vital Signs Blood pressure 107/70, pulse 86, temperature 98.2 F (36.8 C), resp. rate 18, height 6' (1.829 m), weight 89.5 kg, SpO2 95 %. Gen: no distress, normal appearing HEENT: oral mucosa pink and moist, NCAT Cardio: Reg rate Chest: normal effort, normal rate of breathing Abd: soft, non-distended Ext: no edema Psych: pleasant, normal affect Skin: No evidence of breakdown, no evidence of rash Neurologic: Cranial nerves II through XII intact, motor strength is 5/5 in bilateral deltoid, bicep, tricep, grip, hip flexor, knee extensors, ankle dorsiflexor and plantar flexor Sensory exam normal sensation to light touch and proprioception in bilateral upper and lower extremities Cerebellar exam normal finger to nose to finger as well as heel to shin in bilateral upper and lower extremities Musculoskeletal: Full range of motion in all 4 extremities. No joint swelling, RIght AKA - stump shrinker on pt dressed for OT about to start     Skin:    General: Skin is warm and dry.     Comments: IV R forearm looks OK Inner groin/thigh dressing from prior bypass R AKA incision per the MS    Assessment/Plan: 1. Functional deficits which require 3+ hours per day of interdisciplinary therapy in a comprehensive inpatient rehab  setting. Physiatrist is providing close team supervision and 24 hour management of active medical problems listed below. Physiatrist and rehab team continue to assess barriers to discharge/monitor patient progress toward functional and medical goals  Care Tool:  Bathing    Body parts bathed by patient: Right arm, Left arm, Chest, Abdomen, Front perineal area, Right upper leg, Left upper leg, Face, Left lower leg   Body parts bathed by helper: Buttocks Body parts n/a: Right lower leg   Bathing assist Assist Level: Minimal Assistance - Patient > 75%     Upper Body Dressing/Undressing Upper body dressing   What is the patient wearing?: Pull over shirt    Upper body assist Assist Level: Set up assist    Lower Body Dressing/Undressing Lower body dressing      What is the patient wearing?: Incontinence brief, Underwear/pull up     Lower body assist Assist for lower body dressing: Moderate Assistance - Patient 50 - 74%     Toileting Toileting    Toileting assist Assist for toileting: Minimal Assistance - Patient > 75%     Transfers Chair/bed transfer  Transfers assist     Chair/bed transfer assist level: Contact Guard/Touching assist  Locomotion Ambulation   Ambulation assist   Ambulation activity did not occur: Safety/medical concerns (faigue, AKA)  Assist level: Contact Guard/Touching assist Assistive device: Walker-rolling Max distance: 5   Walk 10 feet activity   Assist  Walk 10 feet activity did not occur: Safety/medical concerns  Assist level: Contact Guard/Touching assist Assistive device: Walker-rolling   Walk 50 feet activity   Assist Walk 50 feet with 2 turns activity did not occur: Safety/medical concerns  Assist level: Contact Guard/Touching assist Assistive device: Walker-rolling    Walk 150 feet activity   Assist Walk 150 feet activity did not occur: Safety/medical concerns         Walk 10 feet on uneven surface   activity   Assist Walk 10 feet on uneven surfaces activity did not occur: Safety/medical concerns (faitgue, AKA)         Wheelchair     Assist Is the patient using a wheelchair?: Yes Type of Wheelchair: Manual    Wheelchair assist level: Independent Max wheelchair distance: >150 ft    Wheelchair 50 feet with 2 turns activity    Assist        Assist Level: Independent   Wheelchair 150 feet activity     Assist      Assist Level: Independent   Blood pressure 107/70, pulse 86, temperature 98.2 F (36.8 C), resp. rate 18, height 6' (1.829 m), weight 89.5 kg, SpO2 95 %.  Medical Problem List and Plan: 1. Functional deficits secondary to R AKA 03/05/23 due to PAD ( thrombosis of graft and lack of flow per ABI's. )             -patient may  shower- cover incision             -ELOS/Goals: ~ 2 weeks supervision at w/c level  Continue CIR PT and OT-  2.  Antithrombotics: -DVT/anticoagulation:  Pharmaceutical: Lovenox             -antiplatelet therapy: ASA.  3. Pain: continue Oxycodone prn. D/c dilaudid as not used for past 3 days             --add oxycontin for  more consistent relief. Flexeril prn for spasms. Pain has been uncontrolled, will attempt to get better control with long acting, and treating phantom pain             --reporting phantom pain also--may need to resume gabapentin.   3/20- will add Gabapentin 300 mg QHS  3/21- don't want to increase gabapentin due to Cr of 1.88- but encouraged touch of R AKA for phantom pain- pain much better controlled on Scheduled long acting.   3/22- pain doing better- tolerable  3/23 reports pain is under control, has not had recent use of as needed oxycodone in last couple days  -3/24 Cr trending down, continue gabapentin 300 mg HS, add 100mg  in the AM for phantom pain  3/29- phantom pain (GFR 48) max rec gabapentin dose 700mg  q12h  4. Anxiety: LCSW to follow for evaluation and support.             -antipsychotic  agents: N/A.  --continue ativan prn for anxiety. Consulted Dr. Sima Matas to help with coping/anxiety/limb loss. Doesn't want to look at AKA; also lost long term GF since hospital admission- needs help coping/with grief             --Trazodone added for insomnia prn.  5. Neuropsych/cognition: This patient is capable of making decisions on his  own behalf.  3/22- has seen Neuropsych- doing better somewhat  6. Skin/Wound Care: Monitor wound for healing. Monitor for blistering.   7. Fluids/Electrolytes/Nutrition: Monitor I/O. Check CMET in am.              --Protein supplements added to promote wound healing.  8.  Leucocytosis/Fevers: WBC 18.4-->9.4/ due to RLE cellulitis now s/p AKA.              --Completed ceftriaxone X 5 days  --Resolving and will continue to monitor.  9. HTN: Monitor BP TID. Continue metoprolol --continue to hold prinzide due to AKI and as BP controlled.   3/21- BP controlled in spite of holding BP med- con't to monitor     03/24/2023    7:38 PM 03/24/2023    2:22 PM 03/24/2023    4:35 AM  Vitals with BMI  Systolic [id number]  Diastolic 70 64 53  Pulse 86 75 70   3/29 BP controlled  10. Acute on chronic renal failure: BUN/SCr trending down from 127/5.1-->38/1.87 --continue sodium bicarb as CO2  still elevated but improved 13-->21  3/20- Cr stable at 1.88- BUN very slightly higher at 41 from 36- and CO2 better at 22- con't to monitor 3/21- will recheck in AM- might need some more IVFs- will push fluids 3/22- Cr 1.77 and BUN 57- has continued to increase- was 36-40- will do IVFs to help with dehydration/Azotemia in addition to AKI. Will do IVFs 60cc/hour x 24 hours and recheck BMP in AM.   -3/23 BUN and creatinine improved today to 42/1.59 after 24 hours of IV fluids, encourage oral fluid intake -3/24 continue to encourage oral fluids, recheck BMP tomorrow 3/25- labs pending- last Cr down to 1.59- BUN down to 42 from 57.  3/26- BUN up to 38 but Cr slightly better at  1.57- will recheck Thursday and if worse, will need more IVFs.  3/29 BUN down to 30 yesterday cont to enc po fluids  11. Acute on chronic anemia: Improved post transfusion 3 total units             --add iron supplement. Will con't to monitor  3/20- stable today- 9.1- better today- con't to monitor  -Recheck CBC tomorrow  3/26- Hb 8.7- overall stable 13. Low protein stores: Add juven.  14. Abnormal LFTs: Continue to monitor. Recheck in am.  15. Prediabetes: provide dietary education   16. GERD: continue PPI (was better with Nexium) 17. Concern for bowel incontinence  3/21- not on any scheduled pain meds- will likely get backed up, but will encourage to be on scheduled Laxative  3/22- LBM yesterday per pt- cannot find it documented, but maybe was with therapy?  3/23 last BM today, continent  3/25- LBM Wed/Thursday last week- usually goes 1-2x/week at home- normal for him- will give Miralax today- has been getting prn stool softeners but no laxatives- scared of incontinence. 3/26- no BM in 5-6 days- will give Sorbitol this afternoon after therapy-   3/28: had type 6 stool today: decrease senna-docusate to 1 tab HS 18.  Hyponatremia: resolved, monitor outpatient.   LOS: 11 days A FACE TO FACE EVALUATION WAS PERFORMED  [name] 03/24/2023, 8:24 PM

## 2023-03-25 NOTE — Plan of Care (Signed)
  Problem: RH Balance Goal: LTG Patient will maintain dynamic standing with ADLs (OT) Description: LTG:  Patient will maintain dynamic standing balance with assist during activities of daily living (OT)  Outcome: Completed/Met   Problem: Sit to Stand Goal: LTG:  Patient will perform sit to stand in prep for activites of daily living with assistance level (OT) Description: LTG:  Patient will perform sit to stand in prep for activites of daily living with assistance level (OT) 03/25/2023 1613 by Alessandra Bevels, OT Outcome: Adequate for Discharge Flowsheets (Taken 03/25/2023 1613) LTG: PT will perform sit to stand in prep for activites of daily living with assistance level: Contact Guard/Touching assist 03/25/2023 1141 by Alessandra Bevels, OT Outcome: Adequate for Discharge 03/25/2023 1132 by Alessandra Bevels, OT Outcome: Adequate for Discharge Flowsheets (Taken 03/25/2023 1132) LTG: PT will perform sit to stand in prep for activites of daily living with assistance level: Contact Guard/Touching assist Note: Pt discharging at [facility]Min A for STS transfers dependent on height of transfer surface.    Problem: RH Bathing Goal: LTG Patient will bathe all body parts with assist levels (OT) Description: LTG: Patient will bathe all body parts with assist levels (OT) Outcome: Completed/Met   Problem: RH Dressing Goal: LTG Patient will perform lower body dressing w/assist (OT) Description: LTG: Patient will perform lower body dressing with assist, with/without cues in positioning using equipment (OT) Outcome: Completed/Met Note: With lateral leans EOB or bed-level Problem: RH Toileting Goal: LTG Patient will perform toileting task (3/3 steps) with assistance level (OT) Description: LTG: Patient will perform toileting task (3/3 steps) with assistance level (OT)  Outcome: Completed/Met   Problem: RH Simple Meal Prep Goal: LTG Patient will perform simple meal prep w/assist  (OT) Description: LTG: Patient will perform simple meal prep with assistance, with/without cues (OT). Outcome: Completed/Met Flowsheets (Taken 03/25/2023 1132) LTG: Pt will perform simple meal prep w/level of: Wheelchair level   Problem: RH Light Housekeeping Goal: LTG Patient will perform light housekeeping w/assist (OT) Description: LTG: Patient will perform light housekeeping with assistance, with/without cues (OT). Outcome: Completed/Met   Problem: RH Toilet Transfers Goal: LTG Patient will perform toilet transfers w/assist (OT) Description: LTG: Patient will perform toilet transfers with assist, with/without cues using equipment (OT) Outcome: Completed/Met   Problem: RH Tub/Shower Transfers Goal: LTG Patient will perform tub/shower transfers w/assist (OT) Description: LTG: Patient will perform tub/shower transfers with assist, with/without cues using equipment (OT) Outcome: Completed/Met  .

## 2023-03-25 NOTE — Progress Notes (Signed)
PROGRESS NOTE   Subjective/Complaints: Having some right lower extremity pain, massaging the area No new concerns Ready to go home tomorrow  ROS: Denies SOB, abd pain, CP, N/V/C/D, and vision changes, +phantom limb pain  Objective:   No results found. No results for input(s): "WBC", "HGB", "HCT", "PLT" in the last 72 hours.   No results for input(s): "NA", "K", "CL", "CO2", "GLUCOSE", "BUN", "CREATININE", "CALCIUM" in the last 72 hours.   Intake/Output Summary (Last 24 hours) at 03/25/2023 1301 Last data filed at 03/25/2023 0931 Gross per 24 hour  Intake 712 ml  Output 825 ml  Net -113 ml        Physical Exam: Vital Signs Blood pressure 113/65, pulse 77, temperature 98 F (36.7 C), temperature source Oral, resp. rate 16, height 6' (1.829 m), weight 89.5 kg, SpO2 99 %. Gen: no distress, normal appearing HEENT: oral mucosa pink and moist, NCAT Cardio: Reg rate Chest: normal effort, normal rate of breathing Abd: soft, non-distended Ext: no edema Psych: pleasant, normal affect Skin: intact Psych: pleasant, normal affect Skin: No evidence of breakdown, no evidence of rash Neurologic: Cranial nerves II through XII intact, motor strength is 5/5 in bilateral deltoid, bicep, tricep, grip, hip flexor, knee extensors, ankle dorsiflexor and plantar flexor Sensory exam normal sensation to light touch and proprioception in bilateral upper and lower extremities Cerebellar exam normal finger to nose to finger as well as heel to shin in bilateral upper and lower extremities Musculoskeletal: Full range of motion in all 4 extremities. No joint swelling, RIght AKA - stump shrinker on pt dressed for OT about to start     Skin:    General: Skin is warm and dry.     Comments: IV R forearm looks OK Inner groin/thigh dressing from prior bypass R AKA incision per the MS    Assessment/Plan: 1. Functional deficits which require 3+  hours per day of interdisciplinary therapy in a comprehensive inpatient rehab setting. Physiatrist is providing close team supervision and 24 hour management of active medical problems listed below. Physiatrist and rehab team continue to assess barriers to discharge/monitor patient progress toward functional and medical goals  Care Tool:  Bathing    Body parts bathed by patient: Right arm, Left arm, Chest, Abdomen, Front perineal area, Right upper leg, Left upper leg, Face, Left lower leg   Body parts bathed by helper: Buttocks Body parts n/a: Right lower leg   Bathing assist Assist Level: Minimal Assistance - Patient > 75%     Upper Body Dressing/Undressing Upper body dressing   What is the patient wearing?: Pull over shirt    Upper body assist Assist Level: Set up assist    Lower Body Dressing/Undressing Lower body dressing      What is the patient wearing?: Incontinence brief, Underwear/pull up     Lower body assist Assist for lower body dressing: Moderate Assistance - Patient 50 - 74%     Toileting Toileting    Toileting assist Assist for toileting: Minimal Assistance - Patient > 75%     Transfers Chair/bed transfer  Transfers assist     Chair/bed transfer assist level: Independent with assistive device  Locomotion Ambulation   Ambulation assist   Ambulation activity did not occur: Safety/medical concerns (faigue, AKA)  Assist level: Contact Guard/Touching assist Assistive device: Walker-rolling Max distance: 41   Walk 10 feet activity   Assist  Walk 10 feet activity did not occur: Safety/medical concerns  Assist level: Contact Guard/Touching assist Assistive device: Walker-rolling   Walk 50 feet activity   Assist Walk 50 feet with 2 turns activity did not occur: Safety/medical concerns (fatigue)  Assist level: Contact Guard/Touching assist Assistive device: Walker-rolling    Walk 150 feet activity   Assist Walk 150 feet activity  did not occur: Safety/medical concerns (fatigue)         Walk 10 feet on uneven surface  activity   Assist Walk 10 feet on uneven surfaces activity did not occur: Safety/medical concerns (faitgue, AKA)   Assist level: Contact Guard/Touching assist Assistive device: Walker-rolling   Wheelchair     Assist Is the patient using a wheelchair?: Yes Type of Wheelchair: Manual    Wheelchair assist level: Independent Max wheelchair distance: 300 ft    Wheelchair 50 feet with 2 turns activity    Assist        Assist Level: Independent   Wheelchair 150 feet activity     Assist      Assist Level: Independent   Blood pressure 113/65, pulse 77, temperature 98 F (36.7 C), temperature source Oral, resp. rate 16, height 6' (1.829 m), weight 89.5 kg, SpO2 99 %.  Medical Problem List and Plan: 1. Functional deficits secondary to R AKA 03/05/23 due to PAD ( thrombosis of graft and lack of flow per ABI's. )             -patient may  shower- cover incision             -ELOS/Goals: ~ 2 weeks supervision at w/c level  Continue CIR PT and OT-  2.  Antithrombotics: -DVT/anticoagulation:  Pharmaceutical: Lovenox             -antiplatelet therapy: ASA.  3. Pain: continue Oxycodone prn. D/c dilaudid as not used for past 3 days             --add oxycontin for  more consistent relief. Flexeril prn for spasms. Pain has been uncontrolled, will attempt to get better control with long acting, and treating phantom pain             --reporting phantom pain also--may need to resume gabapentin.   3/20- will add Gabapentin 300 mg QHS  3/21- don't want to increase gabapentin due to Cr of 1.88- but encouraged touch of R AKA for phantom pain- pain much better controlled on Scheduled long acting.   3/22- pain doing better- tolerable  3/23 reports pain is under control, has not had recent use of as needed oxycodone in last couple days  -3/24 Cr trending down, continue gabapentin 300 mg HS,  add 100mg  in the AM for phantom pain  3/29- phantom pain (GFR 48) max rec gabapentin dose 700mg  q12h  4. Anxiety: LCSW to follow for evaluation and support.             -antipsychotic agents: N/A.  --continue ativan prn for anxiety. Consulted Dr. Sima Matas to help with coping/anxiety/limb loss. Doesn't want to look at AKA; also lost long term GF since hospital admission- needs help coping/with grief             --Trazodone added for insomnia prn.  5. Neuropsych/cognition: This patient is  capable of making decisions on his  own behalf.  3/22- has seen Neuropsych- doing better somewhat  6. Skin/Wound Care: Monitor wound for healing. Monitor for blistering.   7. Fluids/Electrolytes/Nutrition: Monitor I/O. Check CMET in am.              --Protein supplements added to promote wound healing.  8.  Leucocytosis/Fevers: WBC 18.4-->9.4/ due to RLE cellulitis now s/p AKA.              --Completed ceftriaxone X 5 days  --Resolving and will continue to monitor.  9. HTN: Monitor BP TID. continuenmetoprolol --continue to hold prinzide due to AKI and as BP controlled.   3/21- BP controlled in spite of holding BP med- con't to monitor     03/25/2023    4:08 AM 03/24/2023    7:38 PM 03/24/2023    2:22 PM  Vitals with BMI  Systolic [id number]  Diastolic 65 70 64  Pulse 77 86 75   10. Acute on chronic renal failure: BUN/SCr trending down from 127/5.1-->38/1.87 --continue sodium bicarb as CO2  still elevated but improved 13-->21  3/20- Cr stable at 1.88- BUN very slightly higher at 41 from 36- and CO2 better at 22- con't to monitor 3/21- will recheck in AM- might need some more IVFs- will push fluids 3/22- Cr 1.77 and BUN 57- has continued to increase- was 36-40- will do IVFs to help with dehydration/Azotemia in addition to AKI. Will do IVFs 60cc/hour x 24 hours and recheck BMP in AM.   -3/23 BUN and creatinine improved today to 42/1.59 after 24 hours of IV fluids, encourage oral fluid intake -3/24  continue to encourage oral fluids, recheck BMP tomorrow 3/25- labs pending- last Cr down to 1.59- BUN down to 42 from 57.  3/26- BUN up to 38 but Cr slightly better at 1.57- will recheck Thursday and if worse, will need more IVFs.  3/29 BUN down to 30 yesterday cont to enc po fluids  11. Acute on chronic anemia: Improved post transfusion 3 total units             -continue iron supplement. Will con't to monitor  3/20- stable today- 9.1- better today- con't to monitor  -Recheck CBC tomorrow  3/26- Hb 8.7- overall stable 13. Low protein stores: continue juven.  14. Abnormal LFTs: Continue to monitor. Recheck in am.  15. Prediabetes: provide dietary education   16. GERD: continue PPI (was better with Nexium) 17. Concern for bowel incontinence  3/21- not on any scheduled pain meds- will likely get backed up, but will encourage to be on scheduled Laxative  3/22- LBM yesterday per pt- cannot find it documented, but maybe was with therapy?  3/23 last BM today, continent  3/25- LBM Wed/Thursday last week- usually goes 1-2x/week at home- normal for him- will give Miralax today- has been getting prn stool softeners but no laxatives- scared of incontinence. 3/26- no BM in 5-6 days- will give Sorbitol this afternoon after therapy-   3/28: had type 6 stool today: decrease senna-docusate to 1 tab HS 18.  Hyponatremia: resolved, monitor outpatient.   LOS: 12 days A FACE TO FACE EVALUATION WAS PERFORMED  Carlyann Placide P Jobin Montelongo 03/25/2023, 1:01 PM

## 2023-03-25 NOTE — Progress Notes (Signed)
Inpatient Rehabilitation Discharge Medication Review by a Pharmacist  A complete drug regimen review was completed for this patient to identify any potential clinically significant medication issues.  High Risk Drug Classes Is patient taking? Indication by Medication  Antipsychotic No   Anticoagulant No   Antibiotic No   Opioid Yes OxyContin q12h and  Oxycodone - pain control  Antiplatelet Yes Aspirin 81 mg - PAD  Hypoglycemics/insulin No   Vasoactive Medication Yes Metoprolol - blood pressure  Chemotherapy No   Other Yes Pantoprazole - GI prophylaxis Sodium bicarbonate - acidosis Iron - supplement Gabapentin - pain  Senokot-S, sorbitol - constipation      Type of Medication Issue Identified Description of Issue Recommendation(s)  Drug Interaction(s) (clinically significant)     Duplicate Therapy     Allergy     No Medication Administration End Date     Incorrect Dose     Additional Drug Therapy Needed     Significant med changes from prior encounter (inform family/care partners about these prior to discharge). New OxyContin, iron supplement, gabapentin. Review med changes with patient prior to discharge.  Other       Clinically significant medication issues were identified that warrant physician communication and completion of prescribed/recommended actions by midnight of the next day:  No  Time spent performing this drug regimen review (minutes):  [place], PharmD Pharmacy Resident  03/25/2023 2:32 PM

## 2023-03-25 NOTE — Progress Notes (Signed)
Physical Therapy Discharge Summary  Patient Details  Name: [name] MRN: [id number] Date of Birth: 08-22-1951  Date of Discharge from PT service:March 25, 2023  Today's Date: 03/25/2023 PT Individual Time: 0927-1000 PT Individual Time Calculation (min): 33 min  and Today's Date: 03/25/2023 PT Missed Time: 12 Minutes Missed Time Reason: Nursing care   Patient has met 7 of 7 long term goals due to improved activity tolerance, improved balance, improved postural control, increased strength, increased range of motion, decreased pain, ability to compensate for deficits, improved awareness, and improved coordination.  Patient to discharge at a wheelchair level Modified Independent.     Reasons goals not met: N/A  Recommendation:  Patient will benefit from ongoing skilled PT services in home health setting to continue to advance safe functional mobility, address ongoing impairments in balance, coordination, pain management, and minimize fall risk.  Equipment: Manual w/c   Reasons for discharge: treatment goals met and discharge from hospital  Patient/family agrees with progress made and goals achieved: Yes  PT Discharge Pain Interference Pain Interference Pain Effect on Sleep: 2. Occasionally Pain Interference with Therapy Activities: 0. Does not apply - I have not received rehabilitationtherapy in the past 5 days Pain Interference with Day-to-Day Activities: 2. Occasionally Cognition Overall Cognitive Status: Within Functional Limits for tasks assessed Arousal/Alertness: Awake/alert Orientation Level: Oriented X4 Memory: Appears intact Awareness: Appears intact Problem Solving: Appears intact Safety/Judgment: Appears intact Sensation Sensation Light Touch: Appears Intact Peripheral sensation comments: Intermediate numbness/tingling in bilateral hands, dependent on position while sleeping. Light Touch Impaired Details: Impaired RLE Hot/Cold: Appears  Intact Proprioception: Appears Intact Stereognosis: Appears Intact Coordination Gross Motor Movements are Fluid and Coordinated: Yes Fine Motor Movements are Fluid and Coordinated: Yes Coordination and Movement Description: grossly coordinated and improved strength s/p R AKA Finger Nose Finger Test: [facility] Heel Shin Test: NT Motor  Motor Motor: Other (comment) (R AKA) Motor - Skilled Clinical Observations: Grossly coordinated and improved strength s/p R AKA  Mobility Bed Mobility Bed Mobility: Rolling Right;Rolling Left;Supine to Sit;Sit to Supine Rolling Right: Independent with assistive device Rolling Left: Independent with assistive device Supine to Sit: Independent with assistive device Sit to Supine: Independent with assistive device Transfers Transfers: Sit to Stand;Stand to Sit;Stand Pivot Transfers Sit to Stand: Independent with assistive device Stand to Sit: Independent with assistive device Stand Pivot Transfers: Independent with assistive device Transfer (Assistive device): Rolling walker Locomotion  Gait Ambulation: Yes Gait Assistance: Independent with assistive device Gait Distance (Feet): 41 Feet Assistive device: Rolling walker Gait Gait: Yes Gait Pattern: Impaired Gait Pattern: Poor foot clearance - left Gait velocity: decreased Stairs / Additional Locomotion Stairs: No Ramp: Supervision/Verbal cueing Pick up small object from the floor assist level: Nurse, mental health Mobility: Yes Wheelchair Assistance: Independent with Camera operator: Both upper extremities Wheelchair Parts Management: Independent Distance: > 150 ft  Trunk/Postural Assessment  Cervical Assessment Cervical Assessment: Within Functional Limits Thoracic Assessment Thoracic Assessment: Within Functional Limits Lumbar Assessment Lumbar Assessment: Within Functional Limits Postural Control Postural Control: Within  Functional Limits  Balance Balance Balance Assessed: Yes Static Sitting Balance Static Sitting - Balance Support: Feet supported Static Sitting - Level of Assistance: 6: Modified independent (Device/Increase time) Dynamic Sitting Balance Dynamic Sitting - Balance Support: During functional activity Dynamic Sitting - Level of Assistance: 6: Modified independent (Device/Increase time) Static Standing Balance Static Standing - Balance Support: Bilateral upper extremity supported;During functional activity Static Standing - Level of Assistance: 6: Modified independent (Device/Increase time)  Dynamic Standing Balance Dynamic Standing - Balance Support: Left upper extremity supported;During functional activity Dynamic Standing - Level of Assistance: 6: Modified independent (Device/Increase time) Dynamic Standing - Balance Activities: Forward lean/weight shifting;Reaching for objects;Reaching across midline Extremity Assessment  RLE Assessment RLE Assessment: Exceptions to [facility] General Strength Comments: Grossly 4/5 LLE Assessment LLE Assessment: Within Functional Limits General Strength Comments: grossly 4+/5    Daily Treatment Session:   Pt missed 12 minutes of PT due to nursing care and was agreeable on PT 2nd's attempt. Pt without reports of pain in session.   PT assessed pain interference, sensation, coordination, strength and balance in preparation for discharge. Pt propelled w/c > 200 ft in session and navigated door frames mod I.   Pt participated in gait training ~50 ft with RW mod I with improved left foot clearance with ambulation. Pt propelled w/c to room and performed stand pivot transfer mod I to return to recliner.   Pt left seated in recliner at bedside with all needs in reach and alarm on.   Verl Dicker Verl Dicker PT, DPT  03/25/2023, 9:39 AM

## 2023-03-25 NOTE — Progress Notes (Signed)
Occupational Therapy Discharge Summary  Patient Details  Name: [name] MRN: [id number] Date of Birth: [birth year]  Date of Discharge from [place] 31, 2024  Today's Date: 03/25/2023 OT Individual Time: 1115-1201 OT Individual Time Calculation (min): 46 min    Patient has met 8 of 9 long term goals due to improved activity tolerance, improved balance, and ability to compensate for deficits.  Patient to discharge at overall Modified Independent level.  Patient's care partner is independent to provide the necessary intermediate physical assistance at discharge.   Reasons goals not met: Pt continues to require CGA-Min A + RW to transfer from low and soft surfaces such as a couch or recliner.   Recommendation:  Patient will benefit from ongoing skilled OT services in home health setting to continue to advance functional skills in the area of BADL, iADL, and Reduce care partner burden.  Equipment: TTB to be purchased at patient's discretion  Reasons for discharge: discharge from hospital  Patient/family agrees with progress made and goals achieved: Yes  OT Discharge Precautions/Restrictions  Precautions Precautions: Fall Restrictions Weight Bearing Restrictions: Yes RLE Weight Bearing: Non weight bearing ADL ADL Equipment Provided: Long-handled sponge Eating: Modified independent Where Assessed-Eating: Chair Grooming: Modified independent Where Assessed-Grooming: Sitting at sink, Wheelchair Upper Body Bathing: Modified independent Where Assessed-Upper Body Bathing: Shower Lower Body Bathing: Modified independent Where Assessed-Lower Body Bathing: Shower Upper Body Dressing: Modified independent (Device) Where Assessed-Upper Body Dressing: Edge of bed Lower Body Dressing: Modified independent Where Assessed-Lower Body Dressing: Edge of bed Toileting: Modified independent Where Assessed-Toileting: Toilet, Recruitment consultant Transfer: Modified  independent Armed forces technical officer Method: Counselling psychologist: Bedside commode, Energy manager: Modified independent Social research officer, government Method: Heritage manager: Gaffer Baseline Vision/History: 1 Wears glasses Patient Visual Report: No change from baseline Perception  Perception: Within Functional Limits Praxis Praxis: Intact Cognition Cognition Overall Cognitive Status: Within Functional Limits for tasks assessed Arousal/Alertness: Awake/alert Awareness: Appears intact Problem Solving: Appears intact Safety/Judgment: Appears intact Brief Interview for Mental Status (BIMS) Repetition of Three Words (First Attempt): 3 Temporal Orientation: Year: Correct Temporal Orientation: Month: Accurate within 5 days Temporal Orientation: Day: Correct Recall: "Sock": No, could not recall Recall: "Blue": Yes, no cue required Recall: "Bed": Yes, no cue required BIMS Summary Score: 13 Sensation Sensation Light Touch: Appears Intact Peripheral sensation comments: Intermediate numbness/tingling in bilateral hands, dependent on position while sleeping. Proprioception: Appears Intact Coordination Gross Motor Movements are Fluid and Coordinated: Yes Fine Motor Movements are Fluid and Coordinated: Yes Coordination and Movement Description: grossly coordinated and improved strength s/p R AKA Finger Nose Finger Test: [facility] Motor  Motor Motor: Other (comment) (R AKA) Motor - Skilled Clinical Observations: Grossly coordinated and improved strength s/p R AKA Mobility  Bed Mobility Bed Mobility: Rolling Right;Rolling Left;Supine to Sit;Sit to Supine Rolling Right: Independent with assistive device Rolling Left: Independent with assistive device Supine to Sit: Independent with assistive device Sit to Supine: Independent with assistive device Transfers Sit to Stand: Independent with assistive device (Requires intermeidate CGA  from low surfaces.) Stand to Sit: Independent with assistive device  Trunk/Postural Assessment  Cervical Assessment Cervical Assessment: Within Functional Limits Thoracic Assessment Thoracic Assessment: Within Functional Limits Lumbar Assessment Lumbar Assessment: Within Functional Limits Postural Control Postural Control: Within Functional Limits  Balance Balance Balance Assessed: Yes Static Sitting Balance Static Sitting - Balance Support: Feet supported Static Sitting - Level of Assistance: 6: Modified independent (Device/Increase time) Dynamic Sitting Balance Dynamic Sitting -  Balance Support: During functional activity Dynamic Sitting - Level of Assistance: 6: Modified independent (Device/Increase time) Static Standing Balance Static Standing - Balance Support: Bilateral upper extremity supported;During functional activity Static Standing - Level of Assistance: 6: Modified independent (Device/Increase time) Dynamic Standing Balance Dynamic Standing - Balance Support: During functional activity Dynamic Standing - Level of Assistance: 5: Stand by assistance (CGA) Dynamic Standing - Balance Activities: Forward lean/weight shifting;Reaching for objects;Reaching across midline Extremity/Trunk Assessment RUE Assessment RUE Assessment: Within Functional Limits LUE Assessment LUE Assessment: Within Functional Limits  Skilled Intervention: Pt received sitting in recliner for skilled OT session with focus on discharge planning. Pt agreeable to interventions, demonstrating overall pleasant mood. Pt with un-rated pain in residual limb. OT offering intermediate rest breaks and positioning suggestions throughout session to address pain/fatigue and maximize participation/safety in session.   Pt performs stand-step transfer from recliner<>WC with close supervision + RW, independently propelling WC from room<>day room. In day room, pt and OT review POC and DME, answering remaining questions.    Back in patient room, pt completes LB dressing for brief change with mod I at EOB with lateral leans. Pt remained sitting with all immediate needs met at end of session.   Maudie Mercury, OTR/L, MSOT  03/25/2023, 12:52 PM

## 2023-03-26 LAB — CBC
HCT: 25 % — ABNORMAL LOW (ref 39.0–52.0)
Hemoglobin: 8.2 g/dL — ABNORMAL LOW (ref 13.0–17.0)
MCH: 29.9 pg (ref 26.0–34.0)
MCHC: 32.8 g/dL (ref 30.0–36.0)
MCV: 91.2 fL (ref 80.0–100.0)
Platelets: 385 10*3/uL (ref 150–400)
RBC: 2.74 MIL/uL — ABNORMAL LOW (ref 4.22–5.81)
RDW: 15.7 % — ABNORMAL HIGH (ref 11.5–15.5)
WBC: 6.5 10*3/uL (ref 4.0–10.5)
nRBC: 0 % (ref 0.0–0.2)

## 2023-03-26 LAB — BASIC METABOLIC PANEL
Anion gap: 7 (ref 5–15)
BUN: 46 mg/dL — ABNORMAL HIGH (ref 8–23)
CO2: 27 mmol/L (ref 22–32)
Calcium: 9 mg/dL (ref 8.9–10.3)
Chloride: 98 mmol/L (ref 98–111)
Creatinine, Ser: 1.73 mg/dL — ABNORMAL HIGH (ref 0.61–1.24)
GFR, Estimated: 41 mL/min — ABNORMAL LOW (ref >60)
Glucose, Bld: 110 mg/dL — ABNORMAL HIGH (ref 70–99)
Potassium: 3.9 mmol/L (ref 3.5–5.1)
Sodium: 132 mmol/L — ABNORMAL LOW (ref 135–145)

## 2023-03-26 MED ORDER — CYCLOBENZAPRINE HCL 5 MG PO TABS: 5.0000 mg | ORAL_TABLET | Freq: Three times a day (TID) | ORAL | 0 refills | Status: DC | PRN

## 2023-03-26 MED ORDER — SODIUM BICARBONATE 650 MG PO TABS: 650.0000 mg | ORAL_TABLET | Freq: Two times a day (BID) | ORAL | Status: DC

## 2023-03-26 MED ORDER — SODIUM BICARBONATE 650 MG PO TABS: 650.0000 mg | ORAL_TABLET | Freq: Three times a day (TID) | ORAL | 0 refills | Status: DC

## 2023-03-26 MED ORDER — OXYCODONE HCL 10 MG PO TABS: 10.0000 mg | ORAL_TABLET | Freq: Three times a day (TID) | ORAL | 0 refills | Status: DC | PRN

## 2023-03-26 MED ORDER — ACETAMINOPHEN 325 MG PO TABS: 325.0000 mg | ORAL_TABLET | ORAL | Status: AC | PRN

## 2023-03-26 MED ORDER — GABAPENTIN 100 MG PO CAPS: 100.0000 mg | ORAL_CAPSULE | Freq: Every day | ORAL | 0 refills | Status: DC

## 2023-03-26 MED ORDER — POLYSACCHARIDE IRON COMPLEX 150 MG PO CAPS: 150.0000 mg | ORAL_CAPSULE | Freq: Every day | ORAL | 0 refills | Status: DC

## 2023-03-26 MED ORDER — GABAPENTIN 300 MG PO CAPS: 300.0000 mg | ORAL_CAPSULE | Freq: Every day | ORAL | 0 refills | Status: DC

## 2023-03-26 MED ORDER — POLYETHYLENE GLYCOL 3350 17 GM/SCOOP PO POWD: 17.0000 g | Freq: Every day | ORAL | 0 refills | Status: DC | PRN

## 2023-03-26 MED ORDER — METOPROLOL TARTRATE 25 MG PO TABS: 12.5000 mg | ORAL_TABLET | Freq: Two times a day (BID) | ORAL | 0 refills | Status: DC

## 2023-03-26 MED ORDER — SODIUM BICARBONATE 650 MG PO TABS: 650.0000 mg | ORAL_TABLET | Freq: Two times a day (BID) | ORAL | 0 refills | Status: DC

## 2023-03-26 MED ORDER — SENNOSIDES-DOCUSATE SODIUM 8.6-50 MG PO TABS: 2.0000 | ORAL_TABLET | Freq: Every day | ORAL | 0 refills | Status: AC

## 2023-03-26 MED ORDER — PANTOPRAZOLE SODIUM 40 MG PO TBEC: 40.0000 mg | DELAYED_RELEASE_TABLET | Freq: Every day | ORAL | 0 refills | Status: DC

## 2023-03-26 NOTE — Progress Notes (Signed)
Closed out nursing education and nursing care plan for discharge.  

## 2023-03-26 NOTE — Progress Notes (Signed)
Inpatient Rehabilitation Care Coordinator Discharge Note   Patient Details  Name: NETHANEL STELTENPOHL MRN: [id number] Date of Birth: 1951/09/01   Discharge location: [place]  Length of Stay: 13 DAYS  Discharge activity level: MOD/I-INDEPENDENT LEVEL  Home/community participation: ACTIVE  Patient response [id number] Literacy - How often do you need to have someone help you when you read instructions, pamphlets, or other written material from your doctor or pharmacy?: Never  Patient response [id number] Isolation - How often do you feel lonely or isolated from those around you?: Never  Services provided included: MD, RD, PT, OT, RN, CM, TR, Pharmacy, Neuropsych, SW  Financial Services:  Charity fundraiser Utilized: Private [employer]  Choices offered to/list presented to: PT AND SISTER  Follow-up services arranged:  Home Health, DME Home Health Agency: [place]    DME : [facility]    Patient response to transportation need: Is the patient able to respond to transportation needs?: Yes In the past 12 months, has lack of transportation kept you from medical appointments or from getting medications?: No In the past 12 months, has lack of transportation kept you from meetings, work, or from getting things needed for daily living?: No    Comments (or additional information):SISTER HAS HERE FOR EDUCATION AND IS A RETIRED RN BOTH COMFORTABLE WITH GOING HOME AND CARE NEEDS.  Patient/Family verbalized understanding of follow-up arrangements:  Yes  Individual responsible for coordination of the follow-up plan: SELF AND ANN-SISTER [phone]  Confirmed correct DME delivered: Elease Hashimoto 03/26/2023    Elease Hashimoto

## 2023-03-26 NOTE — Progress Notes (Addendum)
PROGRESS NOTE   Subjective/Complaints:  Still having phantom pain- but know how to use mirror.   Was educated on how to use a mirror on floor to check toes/foot on L side to try and prevent another amputation.   Pain still 5/10 but tolerable- on Oxycontin 10 mg BID- not taking prn.   Denies constipation.  Says he's drinking.    ROS:  Pt denies SOB, abd pain, CP, N/V/C/D, and vision changes Except for HPI Objective:   No results found. Recent Labs    03/26/23 0613  WBC 6.5  HGB 8.2*  HCT 25.0*  PLT 385     Recent Labs    03/26/23 0613  NA 132*  K 3.9  CL 98  CO2 27  GLUCOSE 110*  BUN 46*  CREATININE 1.73*  CALCIUM 9.0     Intake/Output Summary (Last 24 hours) at 03/26/2023 0928 Last data filed at 03/26/2023 0537 Gross per 24 hour  Intake 720 ml  Output 1200 ml  Net -480 ml        Physical Exam: Vital Signs Blood pressure 117/63, pulse 77, temperature 98.4 F (36.9 C), temperature source Oral, resp. rate 18, height 6' (1.829 m), weight 89.5 kg, SpO2 97 %.   General: awake, alert, appropriate, sitting up in bed rubbing R AKA; NAD HENT: conjugate gaze; oropharynx moist CV: regular rate; no JVD Pulmonary: CTA B/L; no W/R/R- good air movement GI: soft, NT, ND, (+)BS Psychiatric: appropriate Neurological: Ox3  Skin; amputation on R AKA- healing well Neurologic: Cranial nerves II through XII intact, motor strength is 5/5 in bilateral deltoid, bicep, tricep, grip, hip flexor, knee extensors, ankle dorsiflexor and plantar flexor Sensory exam normal sensation to light touch and proprioception in bilateral upper and lower extremities Cerebellar exam normal finger to nose to finger as well as heel to shin in bilateral upper and lower extremities Musculoskeletal: Full range of motion in all 4 extremities. No joint swelling, RIght AKA - stump shrinker on pt dressed for OT about to start     Skin:     General: Skin is warm and dry.     Comments: IV R forearm looks OK Inner groin/thigh dressing from prior bypass R AKA incision per the MS    Assessment/Plan: 1. Functional deficits which require 3+ hours per day of interdisciplinary therapy in a comprehensive inpatient rehab setting. Physiatrist is providing close team supervision and 24 hour management of active medical problems listed below. Physiatrist and rehab team continue to assess barriers to discharge/monitor patient progress toward functional and medical goals  Care Tool:  Bathing    Body parts bathed by patient: Right arm, Left arm, Chest, Abdomen, Front perineal area, Right upper leg, Left upper leg, Face, Left lower leg, Buttocks   Body parts bathed by helper: Buttocks Body parts n/a: Right lower leg   Bathing assist Assist Level: Independent with assistive device     Upper Body Dressing/Undressing Upper body dressing   What is the patient wearing?: Pull over shirt    Upper body assist Assist Level: Independent with assistive device    Lower Body Dressing/Undressing Lower body dressing      What is the  patient wearing?: Incontinence brief, Underwear/pull up     Lower body assist Assist for lower body dressing: Independent with assitive device     Toileting Toileting    Toileting assist Assist for toileting: Independent with assistive device     Transfers Chair/bed transfer  Transfers assist     Chair/bed transfer assist level: Independent with assistive device     Locomotion Ambulation   Ambulation assist   Ambulation activity did not occur: Safety/medical concerns (faigue, AKA)  Assist level: Contact Guard/Touching assist Assistive device: Walker-rolling Max distance: 41   Walk 10 feet activity   Assist  Walk 10 feet activity did not occur: Safety/medical concerns  Assist level: Contact Guard/Touching assist Assistive device: Walker-rolling   Walk 50 feet activity   Assist  Walk 50 feet with 2 turns activity did not occur: Safety/medical concerns (fatigue)  Assist level: Contact Guard/Touching assist Assistive device: Walker-rolling    Walk 150 feet activity   Assist Walk 150 feet activity did not occur: Safety/medical concerns (fatigue)         Walk 10 feet on uneven surface  activity   Assist Walk 10 feet on uneven surfaces activity did not occur: Safety/medical concerns (faitgue, AKA)   Assist level: Contact Guard/Touching assist Assistive device: Walker-rolling   Wheelchair     Assist Is the patient using a wheelchair?: Yes Type of Wheelchair: Manual    Wheelchair assist level: Independent Max wheelchair distance: 300 ft    Wheelchair 50 feet with 2 turns activity    Assist        Assist Level: Independent   Wheelchair 150 feet activity     Assist      Assist Level: Independent   Blood pressure 117/63, pulse 77, temperature 98.4 F (36.9 C), temperature source Oral, resp. rate 18, height 6' (1.829 m), weight 89.5 kg, SpO2 97 %.  Medical Problem List and Plan: 1. Functional deficits secondary to R AKA 03/05/23 due to PAD ( thrombosis of graft and lack of flow per ABI's. )             -patient may  shower- cover incision             -ELOS/Goals: ~ 2 weeks supervision at w/c level  D/c today 2.  Antithrombotics: -DVT/anticoagulation:  Pharmaceutical: Lovenox             -antiplatelet therapy: ASA.  3. Pain: continue Oxycodone prn. D/c dilaudid as not used for past 3 days             --add oxycontin for  more consistent relief. Flexeril prn for spasms. Pain has been uncontrolled, will attempt to get better control with long acting, and treating phantom pain             --reporting phantom pain also--may need to resume gabapentin.   3/20- will add Gabapentin 300 mg QHS  3/21- don't want to increase gabapentin due to Cr of 1.88- but encouraged touch of R AKA for phantom pain- pain much better controlled on  Scheduled long acting.   3/22- pain doing better- tolerable  3/23 reports pain is under control, has not had recent use of as needed oxycodone in last couple days  -3/24 Cr trending down, continue gabapentin 300 mg HS, add 100mg  in the AM for phantom pain  3/29- phantom pain (GFR 48) max rec gabapentin dose 700mg  q12h  4. Anxiety: LCSW to follow for evaluation and support.             -  antipsychotic agents: N/A.  --continue ativan prn for anxiety. Consulted Dr. Sima Matas to help with coping/anxiety/limb loss. Doesn't want to look at AKA; also lost long term GF since hospital admission- needs help coping/with grief             --Trazodone added for insomnia prn.  5. Neuropsych/cognition: This patient is capable of making decisions on his  own behalf.  3/22- has seen Neuropsych- doing better somewhat  6. Skin/Wound Care: Monitor wound for healing. Monitor for blistering.   7. Fluids/Electrolytes/Nutrition: Monitor I/O. Check CMET in am.              --Protein supplements added to promote wound healing.  8.  Leucocytosis/Fevers: WBC 18.4-->9.4/ due to RLE cellulitis now s/p AKA.              --Completed ceftriaxone X 5 days  --Resolving and will continue to monitor.  9. HTN: Monitor BP TID. continuenmetoprolol --continue to hold prinzide due to AKI and as BP controlled.   3/21- BP controlled in spite of holding BP med- con't to monitor     03/26/2023    5:38 AM 03/25/2023    8:05 PM 03/25/2023    2:07 PM  Vitals with BMI  Systolic [id number]  Diastolic 63 60 68  Pulse 77 81 78   10. Acute on chronic renal failure: BUN/SCr trending down from 127/5.1-->38/1.87 --continue sodium bicarb as CO2  still elevated but improved 13-->21  3/20- Cr stable at 1.88- BUN very slightly higher at 41 from 36- and CO2 better at 22- con't to monitor 3/21- will recheck in AM- might need some more IVFs- will push fluids 3/22- Cr 1.77 and BUN 57- has continued to increase- was 36-40- will do IVFs to help with  dehydration/Azotemia in addition to AKI. Will do IVFs 60cc/hour x 24 hours and recheck BMP in AM.   -3/23 BUN and creatinine improved today to 42/1.59 after 24 hours of IV fluids, encourage oral fluid intake -3/24 continue to encourage oral fluids, recheck BMP tomorrow 3/25- labs pending- last Cr down to 1.59- BUN down to 42 from 57.  3/26- BUN up to 38 but Cr slightly better at 1.57- will recheck Thursday and if worse, will need more IVFs.  3/29 BUN down to 30 yesterday cont to enc po fluids  4/1- BUN back up to 46- and Cr 1.73- he needs to drink more at home as well- will need f/u on CKD 11. Acute on chronic anemia: Improved post transfusion 3 total units             -continue iron supplement. Will con't to monitor  3/20- stable today- 9.1- better today- con't to monitor  -Recheck CBC tomorrow  3/26- Hb 8.7- overall stable  4/1- Hb 8.2- 13. Low protein stores: continue juven.  14. Abnormal LFTs: Continue to monitor. Recheck in am.  15. Prediabetes: provide dietary education   16. GERD: continue PPI (was better with Nexium) 17. Concern for bowel incontinence  3/21- not on any scheduled pain meds- will likely get backed up, but will encourage to be on scheduled Laxative  3/22- LBM yesterday per pt- cannot find it documented, but maybe was with therapy?  3/23 last BM today, continent  3/25- LBM Wed/Thursday last week- usually goes 1-2x/week at home- normal for him- will give Miralax today- has been getting prn stool softeners but no laxatives- scared of incontinence. 3/26- no BM in 5-6 days- will give Sorbitol this afternoon after therapy-  3/28: had type 6 stool today: decrease senna-docusate to 1 tab HS 4/1- said has gone lately.  18.  Hyponatremia: resolved, monitor outpatient.    D/w PA about d/c meds and weaning off Oxycontin since only taken Short acting 1 x in last 10 days. Will give Oxycodone IR 3x/day as needed for next 7 days and then pt can call my office if needs more - which  I'm happy to write for.    I spent a total of 35   minutes on total care today- >50% coordination of care- due to  D/w PA as noted above.    Also needs f/u on CKD will need PCP vs renal soon.   LOS: 13 days A FACE TO FACE EVALUATION WAS PERFORMED  [name] 03/26/2023, 9:28 AM

## 2023-03-26 NOTE — Discharge Instructions (Addendum)
Inpatient Rehab Discharge Instructions  [name] Discharge date and time:    Activities/Precautions/ Functional Status: Activity: no lifting, driving, or strenuous exercise till cleared by MD Diet: low fat, low cholesterol diet Wound Care: keep wound clean and dry. Wash with antibacterial soap and water, pat dry and cover with dry dressing. Contact Dr. Donzetta Matters if you develop any problems with your incision/wound--redness, swelling, increase in pain, drainage or if you develop fever or chills.    Functional status:  ___ No restrictions     ___ Walk up steps independently ___ 24/7 supervision/assistance   ___ Walk up steps with assistance _X__ Intermittent supervision/assistance  ___ Bathe/dress independently ___ Walk with walker     ___ Bathe/dress with assistance ___ Walk Independently    ___ Shower independently ___ Walk with assistance    ___ Shower with assistance ___ No alcohol     ___ Return to work/school ________  Special Instructions: Need to drink plenty of water.  Need kidney function tests rechecked in 1-2 weeks by Dr. Berdine Addison.  2. [facility] will provide PT/OT--  My questions have been answered and I understand these instructions. I will adhere to these goals and the provided educational materials after my discharge from the hospital.  Patient/Caregiver Signature _______________________________ Date __________  Clinician Signature _______________________________________ Date __________  Please bring this form and your medication list with you to all your follow-up doctor's appointments.

## 2023-03-26 NOTE — Discharge Summary (Signed)
Physician Discharge Summary  Patient ID: [name] MRN: [id number] DOB/AGE: 1951/08/21 72 y.o.  Admit date: 03/13/2023 Discharge date: 03/26/2023  Discharge Diagnoses:  Principal Problem:   Above knee amputation of right lower extremity Active Problems:   CKD (chronic kidney disease) stage 3, GFR 30-59 ml/min   Adjustment disorder with mixed anxiety and depressed mood   Discharged Condition: {condition:18240}  Significant Diagnostic Studies: US RENAL  Result Date: 03/06/2023 CLINICAL DATA:  AKI. EXAM: RENAL / URINARY TRACT ULTRASOUND COMPLETE COMPARISON:  03/05/2019. FINDINGS: Right Kidney: Renal measurements: 12.9 x 6.5 x 6.2 cm = volume: 271 mL. Echogenicity within normal limits. Cysts are noted in the right kidney measuring 5.5 x 3.5 x 4.7 cm and 4.3 x 4.4 x 4.5 cm. There is moderate hydronephrosis. Left Kidney: Renal measurements: 10.1 x 6.0 x 6.0 cm = volume: 190 mL. Echogenicity within normal limits. A cyst is present in the lower pole measuring 3.7 x 3.1 x 2.8 cm. Mild hydronephrosis is noted. Bladder: Appears normal for degree of bladder distention. Ureteral jets are not seen. Prevoid bladder volume is 321 mL. Postvoid bladder volume could not be obtained. Other: None. IMPRESSION: 1. Moderate hydronephrosis on the right and mild hydronephrosis on the left. 2. Bilateral renal cysts. Electronically Signed   By: Brett Fairy M.D.   On: 03/06/2023 03:36   PERIPHERAL VASCULAR CATHETERIZATION  Result Date: 03/05/2023 Images from the original result were not included. Patient name: [name] MRN: [id number] DOB: 11/10/1951 Sex: male 03/05/2023 Pre-operative Diagnosis: Chronic right lower extremity limb threatening ischemia with toe and foot gangrene Post-operative diagnosis:  Same Surgeon:  Eda Paschal. Donzetta Matters, MD Procedure Performed: 1.  Ultrasound-guided cannulation left common femoral artery 2.  Selection of right common femoral artery.  Right lower extremity CO2 angiography 3.   Ultrasound-guided cannulation right anterior tibial and posterior tibial arteries 4.  Moderate sedation with fentanyl and Versed for 36 minutes 5.  Mynx device closure left common femoral artery Indications: 72 year old male with a history of a right common femoral endarterectomy and femoral to posterior tibial artery bypass.  He has extensive wounds of the right lower extremity he has been counseled on the need for amputation but would like 1 last attempt at angiography with possible.  He now presents for angiography of the right lower extremity and his creatinine was severely elevated from baseline we elected to use CO2 and no contrast. Findings: There is a very strong common femoral pulse.  The common femoral artery was patulous consistent with previous endarterectomy there does not appear to be runoff into the SFA.  Distally in the leg we can only visualize a collateral below the knee.  I attempted to cannulate both the anterior tibial and posterior tibial arteries but these were chronically thrombosed despite getting needles and the blood return there was no ability to pass a wire. Patient will be scheduled for right above-knee amputation when he is medically stable.  Procedure:  The patient was identified in the holding area and taken to room 2.  The patient was then placed supine on the table and prepped and draped in the usual sterile fashion.  A time out was called.  Ultrasound was used to evaluate the left common femoral artery which is patent and compressible.  The area was anesthetized 1% lidocaine cannulated by functional followed by wire and sheath.  And images saved the permanent record.  Concomitantly we administered fentanyl and Versed for moderate sedation and his bedside vitals were monitored by nursing.  We placed a Bentson wire followed by a 5 [place] sheath.  We crossed the bifurcation with Omni catheter elected not to perform aortogram given that this was just performed 4 months ago.  Will  perform CO2 angiography of the right lower extremity there was really no runoff identified.  With this I elected to prepped out the right foot.  I then used ultrasound guidance to cannulate the posterior tibial and anterior tibial arteries but there was no backbleeding and a wire was passed.  Given the extensive gangrene patient will need amputation above the knee on the right.  We remove the wire and then applied a minx device to the left common femoral artery.  Patient did tolerate procedure without any complication. Contrast: 36cc Brandon C. Donzetta Matters, MD [facility] Office: [phone] Pager: [phone]    Labs:  Basic Metabolic Panel: Recent Labs  Lab 03/19/23 1022 03/20/23 0644 03/22/23 0629 03/26/23 0613  NA 133* 133* 135 132*  K 5.2* 4.7 4.1 3.9  CL 98 99 100 98  CO2 24 23 25 27   GLUCOSE 108* 96 106* 110*  BUN 30* 38* 30* 46*  CREATININE 1.61* 1.57* 1.52* 1.73*  CALCIUM 9.2 9.0 9.1 9.0    CBC: Recent Labs  Lab 03/19/23 1022 03/26/23 0613  WBC 8.0 6.5  HGB 8.7* 8.2*  HCT 28.0* 25.0*  MCV 92.1 91.2  PLT 517* 385    CBG: No results for input(s): "GLUCAP" in the last 168 hours.  Brief HPI:   [name] is a 72 y.o. male ***   Hospital Course: [name] was admitted to rehab 03/13/2023 for inpatient therapies to consist of PT and OT at least three hours five days a week. Past admission physiatrist, therapy team and rehab RN have worked together to provide customized collaborative inpatient rehab.   [facility] was consulted for medical management. Labs at admission showed acute renal failure with Scr-127, BUN-5.1 and K+6.0 with anion gap of 17 and he was started on IVF with .   Blood pressures were monitored on TID basis and     Rehab course: During patient's stay in rehab weekly team conferences were held to monitor patient's progress, set goals and discuss barriers to discharge. At admission, patient required  He  has had  improvement in activity tolerance, balance, postural control as well as ability to compensate for deficits.  He is able to complete ADL tasks at modified independent level. He is independent for transfers and to ambulate 33' with RW. He is independent to propel his WC for > 150'.       Disposition: Home  Diet: Heart healthy.   Special Instructions: Recommend follow up BMET in 7-10 days with referral to nephrology if renal status does not improve.  2.      Allergies as of 03/26/2023       Reactions   Shellfish Allergy Swelling     Med Rec must be completed prior to using this [place]***        Signed: Bary Leriche 03/26/2023, 9:38 AM

## 2023-03-27 DIAGNOSIS — D62 Acute posthemorrhagic anemia: Secondary | ICD-10-CM | POA: Insufficient documentation

## 2023-03-27 DIAGNOSIS — G546 Phantom limb syndrome with pain: Secondary | ICD-10-CM | POA: Insufficient documentation

## 2023-03-28 ENCOUNTER — Other Ambulatory Visit: Payer: Self-pay

## 2023-03-28 NOTE — Progress Notes (Signed)
error 

## 2023-03-30 DIAGNOSIS — Z4781 Encounter for orthopedic aftercare following surgical amputation: Secondary | ICD-10-CM | POA: Diagnosis not present

## 2023-03-30 DIAGNOSIS — Z7982 Long term (current) use of aspirin: Secondary | ICD-10-CM | POA: Diagnosis not present

## 2023-03-30 DIAGNOSIS — I739 Peripheral vascular disease, unspecified: Secondary | ICD-10-CM | POA: Diagnosis not present

## 2023-03-30 DIAGNOSIS — I129 Hypertensive chronic kidney disease with stage 1 through stage 4 chronic kidney disease, or unspecified chronic kidney disease: Secondary | ICD-10-CM | POA: Diagnosis not present

## 2023-03-30 DIAGNOSIS — K219 Gastro-esophageal reflux disease without esophagitis: Secondary | ICD-10-CM | POA: Diagnosis not present

## 2023-03-30 DIAGNOSIS — G546 Phantom limb syndrome with pain: Secondary | ICD-10-CM | POA: Diagnosis not present

## 2023-03-30 DIAGNOSIS — N183 Chronic kidney disease, stage 3 unspecified: Secondary | ICD-10-CM | POA: Diagnosis not present

## 2023-03-30 DIAGNOSIS — Z87442 Personal history of urinary calculi: Secondary | ICD-10-CM | POA: Diagnosis not present

## 2023-03-30 DIAGNOSIS — Z89611 Acquired absence of right leg above knee: Secondary | ICD-10-CM | POA: Diagnosis not present

## 2023-03-30 DIAGNOSIS — Z9181 History of falling: Secondary | ICD-10-CM | POA: Diagnosis not present

## 2023-03-30 DIAGNOSIS — D62 Acute posthemorrhagic anemia: Secondary | ICD-10-CM | POA: Diagnosis not present

## 2023-03-30 DIAGNOSIS — E785 Hyperlipidemia, unspecified: Secondary | ICD-10-CM | POA: Diagnosis not present

## 2023-03-30 DIAGNOSIS — Z87891 Personal history of nicotine dependence: Secondary | ICD-10-CM | POA: Diagnosis not present

## 2023-03-30 DIAGNOSIS — N1330 Unspecified hydronephrosis: Secondary | ICD-10-CM | POA: Diagnosis not present

## 2023-04-02 DIAGNOSIS — Z7982 Long term (current) use of aspirin: Secondary | ICD-10-CM | POA: Diagnosis not present

## 2023-04-02 DIAGNOSIS — N1330 Unspecified hydronephrosis: Secondary | ICD-10-CM | POA: Diagnosis not present

## 2023-04-02 DIAGNOSIS — Z87442 Personal history of urinary calculi: Secondary | ICD-10-CM | POA: Diagnosis not present

## 2023-04-02 DIAGNOSIS — I10 Essential (primary) hypertension: Secondary | ICD-10-CM | POA: Diagnosis not present

## 2023-04-02 DIAGNOSIS — Z87891 Personal history of nicotine dependence: Secondary | ICD-10-CM | POA: Diagnosis not present

## 2023-04-02 DIAGNOSIS — N1831 Chronic kidney disease, stage 3a: Secondary | ICD-10-CM | POA: Diagnosis not present

## 2023-04-02 DIAGNOSIS — Z9181 History of falling: Secondary | ICD-10-CM | POA: Diagnosis not present

## 2023-04-02 DIAGNOSIS — Z89611 Acquired absence of right leg above knee: Secondary | ICD-10-CM | POA: Diagnosis not present

## 2023-04-02 DIAGNOSIS — I739 Peripheral vascular disease, unspecified: Secondary | ICD-10-CM | POA: Diagnosis not present

## 2023-04-02 DIAGNOSIS — G546 Phantom limb syndrome with pain: Secondary | ICD-10-CM | POA: Diagnosis not present

## 2023-04-02 DIAGNOSIS — D62 Acute posthemorrhagic anemia: Secondary | ICD-10-CM | POA: Diagnosis not present

## 2023-04-02 DIAGNOSIS — I129 Hypertensive chronic kidney disease with stage 1 through stage 4 chronic kidney disease, or unspecified chronic kidney disease: Secondary | ICD-10-CM | POA: Diagnosis not present

## 2023-04-02 DIAGNOSIS — E785 Hyperlipidemia, unspecified: Secondary | ICD-10-CM | POA: Diagnosis not present

## 2023-04-02 DIAGNOSIS — K219 Gastro-esophageal reflux disease without esophagitis: Secondary | ICD-10-CM | POA: Diagnosis not present

## 2023-04-02 DIAGNOSIS — N183 Chronic kidney disease, stage 3 unspecified: Secondary | ICD-10-CM | POA: Diagnosis not present

## 2023-04-02 DIAGNOSIS — R600 Localized edema: Secondary | ICD-10-CM | POA: Diagnosis not present

## 2023-04-02 DIAGNOSIS — Z4781 Encounter for orthopedic aftercare following surgical amputation: Secondary | ICD-10-CM | POA: Diagnosis not present

## 2023-04-04 DIAGNOSIS — Z87891 Personal history of nicotine dependence: Secondary | ICD-10-CM | POA: Diagnosis not present

## 2023-04-04 DIAGNOSIS — N183 Chronic kidney disease, stage 3 unspecified: Secondary | ICD-10-CM | POA: Diagnosis not present

## 2023-04-04 DIAGNOSIS — Z89611 Acquired absence of right leg above knee: Secondary | ICD-10-CM | POA: Diagnosis not present

## 2023-04-04 DIAGNOSIS — D62 Acute posthemorrhagic anemia: Secondary | ICD-10-CM | POA: Diagnosis not present

## 2023-04-04 DIAGNOSIS — Z7982 Long term (current) use of aspirin: Secondary | ICD-10-CM | POA: Diagnosis not present

## 2023-04-04 DIAGNOSIS — I739 Peripheral vascular disease, unspecified: Secondary | ICD-10-CM | POA: Diagnosis not present

## 2023-04-04 DIAGNOSIS — E785 Hyperlipidemia, unspecified: Secondary | ICD-10-CM | POA: Diagnosis not present

## 2023-04-04 DIAGNOSIS — Z4781 Encounter for orthopedic aftercare following surgical amputation: Secondary | ICD-10-CM | POA: Diagnosis not present

## 2023-04-04 DIAGNOSIS — I129 Hypertensive chronic kidney disease with stage 1 through stage 4 chronic kidney disease, or unspecified chronic kidney disease: Secondary | ICD-10-CM | POA: Diagnosis not present

## 2023-04-04 DIAGNOSIS — N1330 Unspecified hydronephrosis: Secondary | ICD-10-CM | POA: Diagnosis not present

## 2023-04-04 DIAGNOSIS — Z9181 History of falling: Secondary | ICD-10-CM | POA: Diagnosis not present

## 2023-04-04 DIAGNOSIS — G546 Phantom limb syndrome with pain: Secondary | ICD-10-CM | POA: Diagnosis not present

## 2023-04-04 DIAGNOSIS — K219 Gastro-esophageal reflux disease without esophagitis: Secondary | ICD-10-CM | POA: Diagnosis not present

## 2023-04-04 DIAGNOSIS — Z87442 Personal history of urinary calculi: Secondary | ICD-10-CM | POA: Diagnosis not present

## 2023-04-05 ENCOUNTER — Other Ambulatory Visit: Payer: Self-pay

## 2023-04-05 MED ORDER — ATORVASTATIN CALCIUM 40 MG PO TABS: 40.0000 mg | ORAL_TABLET | Freq: Every morning | ORAL | 0 refills | Status: DC

## 2023-04-06 ENCOUNTER — Ambulatory Visit: Payer: Medicare Other | Admitting: Vascular Surgery

## 2023-04-09 DIAGNOSIS — Z9181 History of falling: Secondary | ICD-10-CM | POA: Diagnosis not present

## 2023-04-09 DIAGNOSIS — N183 Chronic kidney disease, stage 3 unspecified: Secondary | ICD-10-CM | POA: Diagnosis not present

## 2023-04-09 DIAGNOSIS — Z7982 Long term (current) use of aspirin: Secondary | ICD-10-CM | POA: Diagnosis not present

## 2023-04-09 DIAGNOSIS — N1330 Unspecified hydronephrosis: Secondary | ICD-10-CM | POA: Diagnosis not present

## 2023-04-09 DIAGNOSIS — I129 Hypertensive chronic kidney disease with stage 1 through stage 4 chronic kidney disease, or unspecified chronic kidney disease: Secondary | ICD-10-CM | POA: Diagnosis not present

## 2023-04-09 DIAGNOSIS — K219 Gastro-esophageal reflux disease without esophagitis: Secondary | ICD-10-CM | POA: Diagnosis not present

## 2023-04-09 DIAGNOSIS — E785 Hyperlipidemia, unspecified: Secondary | ICD-10-CM | POA: Diagnosis not present

## 2023-04-09 DIAGNOSIS — D62 Acute posthemorrhagic anemia: Secondary | ICD-10-CM | POA: Diagnosis not present

## 2023-04-09 DIAGNOSIS — I739 Peripheral vascular disease, unspecified: Secondary | ICD-10-CM | POA: Diagnosis not present

## 2023-04-09 DIAGNOSIS — Z87442 Personal history of urinary calculi: Secondary | ICD-10-CM | POA: Diagnosis not present

## 2023-04-09 DIAGNOSIS — Z87891 Personal history of nicotine dependence: Secondary | ICD-10-CM | POA: Diagnosis not present

## 2023-04-09 DIAGNOSIS — Z4781 Encounter for orthopedic aftercare following surgical amputation: Secondary | ICD-10-CM | POA: Diagnosis not present

## 2023-04-09 DIAGNOSIS — G546 Phantom limb syndrome with pain: Secondary | ICD-10-CM | POA: Diagnosis not present

## 2023-04-09 DIAGNOSIS — Z89611 Acquired absence of right leg above knee: Secondary | ICD-10-CM | POA: Diagnosis not present

## 2023-04-10 ENCOUNTER — Encounter: Payer: Self-pay | Admitting: Family Medicine

## 2023-04-10 ENCOUNTER — Ambulatory Visit (INDEPENDENT_AMBULATORY_CARE_PROVIDER_SITE_OTHER): Payer: Medicare Other | Admitting: Family Medicine

## 2023-04-10 VITALS — BP 115/75 | HR 77 | Ht 70.0 in | Wt 206.0 lb

## 2023-04-10 DIAGNOSIS — N183 Chronic kidney disease, stage 3 unspecified: Secondary | ICD-10-CM | POA: Diagnosis not present

## 2023-04-10 DIAGNOSIS — K219 Gastro-esophageal reflux disease without esophagitis: Secondary | ICD-10-CM | POA: Diagnosis not present

## 2023-04-10 DIAGNOSIS — E785 Hyperlipidemia, unspecified: Secondary | ICD-10-CM | POA: Diagnosis not present

## 2023-04-10 DIAGNOSIS — Z9181 History of falling: Secondary | ICD-10-CM | POA: Diagnosis not present

## 2023-04-10 DIAGNOSIS — Z4781 Encounter for orthopedic aftercare following surgical amputation: Secondary | ICD-10-CM | POA: Diagnosis not present

## 2023-04-10 DIAGNOSIS — I739 Peripheral vascular disease, unspecified: Secondary | ICD-10-CM | POA: Diagnosis not present

## 2023-04-10 DIAGNOSIS — I129 Hypertensive chronic kidney disease with stage 1 through stage 4 chronic kidney disease, or unspecified chronic kidney disease: Secondary | ICD-10-CM | POA: Diagnosis not present

## 2023-04-10 DIAGNOSIS — Z1159 Encounter for screening for other viral diseases: Secondary | ICD-10-CM

## 2023-04-10 DIAGNOSIS — Z1321 Encounter for screening for nutritional disorder: Secondary | ICD-10-CM

## 2023-04-10 DIAGNOSIS — G546 Phantom limb syndrome with pain: Secondary | ICD-10-CM | POA: Diagnosis not present

## 2023-04-10 DIAGNOSIS — Z87442 Personal history of urinary calculi: Secondary | ICD-10-CM | POA: Diagnosis not present

## 2023-04-10 DIAGNOSIS — Z0001 Encounter for general adult medical examination with abnormal findings: Secondary | ICD-10-CM

## 2023-04-10 DIAGNOSIS — Z131 Encounter for screening for diabetes mellitus: Secondary | ICD-10-CM

## 2023-04-10 DIAGNOSIS — I10 Essential (primary) hypertension: Secondary | ICD-10-CM

## 2023-04-10 DIAGNOSIS — N1330 Unspecified hydronephrosis: Secondary | ICD-10-CM | POA: Diagnosis not present

## 2023-04-10 DIAGNOSIS — Z7982 Long term (current) use of aspirin: Secondary | ICD-10-CM | POA: Diagnosis not present

## 2023-04-10 DIAGNOSIS — D62 Acute posthemorrhagic anemia: Secondary | ICD-10-CM | POA: Diagnosis not present

## 2023-04-10 DIAGNOSIS — Z1329 Encounter for screening for other suspected endocrine disorder: Secondary | ICD-10-CM | POA: Diagnosis not present

## 2023-04-10 DIAGNOSIS — N1832 Chronic kidney disease, stage 3b: Secondary | ICD-10-CM | POA: Diagnosis not present

## 2023-04-10 DIAGNOSIS — Z87891 Personal history of nicotine dependence: Secondary | ICD-10-CM | POA: Diagnosis not present

## 2023-04-10 DIAGNOSIS — Z13220 Encounter for screening for lipoid disorders: Secondary | ICD-10-CM | POA: Diagnosis not present

## 2023-04-10 DIAGNOSIS — Z89611 Acquired absence of right leg above knee: Secondary | ICD-10-CM | POA: Diagnosis not present

## 2023-04-10 NOTE — Patient Instructions (Signed)
It was pleasure meeting with you today. Please take medications as prescribed. Follow up with your primary health provider if any health concerns arises.  

## 2023-04-10 NOTE — Progress Notes (Signed)
New Patient Office Visit   Subjective   Patient ID: [name], male    DOB: 1951-06-15  Age: 72 y.o. MRN: [id number]  CC:  Chief Complaint  Patient presents with   Establish Care    HPI [name] 72 year old male, presents to establish care. He  has a past medical history of Family history of metabolic acidosis with increased anion gap (03/05/2023), GERD (gastroesophageal reflux disease), History of kidney stones, Hyperlipidemia, Hypertension, Prostate cancer (2011), Umbilical hernia, Wears dentures, Wears glasses, and Wears partial dentures.For the details of today's visit, please refer to assessment and plan.   HPI   Outpatient Encounter Medications as of 04/10/2023  Medication Sig   acetaminophen (TYLENOL) 325 MG tablet Take 1-2 tablets (325-650 mg total) by mouth every 4 (four) hours as needed for mild pain.   aspirin EC 81 MG tablet Take 81 mg by mouth daily.   atorvastatin (LIPITOR) 40 MG tablet Take 1 tablet (40 mg total) by mouth every morning.   cyclobenzaprine (FLEXERIL) 5 MG tablet Take 1 tablet (5 mg total) by mouth 3 (three) times daily as needed for muscle spasms.   gabapentin (NEURONTIN) 100 MG capsule Take 1 capsule (100 mg total) by mouth daily.   gabapentin (NEURONTIN) 300 MG capsule Take 1 capsule (300 mg total) by mouth at bedtime.   iron polysaccharides (NIFEREX) 150 MG capsule Take 1 capsule (150 mg total) by mouth daily.   metoprolol tartrate (LOPRESSOR) 25 MG tablet Take 0.5 tablets (12.5 mg total) by mouth 2 (two) times daily.   Multiple Vitamins-Minerals (CENTRUM SILVER PO) Take 1 tablet by mouth daily.    Oxycodone HCl 10 MG TABS Take 1 tablet (10 mg total) by mouth 3 (three) times daily as needed.   pantoprazole (PROTONIX) 40 MG tablet Take 1 tablet (40 mg total) by mouth daily.   polyethylene glycol powder (MIRALAX) 17 GM/SCOOP powder Take 17 g by mouth daily as needed for mild constipation.   senna-docusate (SENOKOT-S) 8.6-50 MG tablet  Take 2 tablets by mouth daily after supper.   sodium bicarbonate 650 MG tablet Take 1 tablet (650 mg total) by mouth 2 (two) times daily.   No facility-administered encounter medications on file as of 04/10/2023.    Past Surgical History:  Procedure Laterality Date   ABDOMINAL AORTOGRAM W/LOWER EXTREMITY N/A 11/20/2022   Procedure: ABDOMINAL AORTOGRAM W/LOWER EXTREMITY;  Surgeon: Maeola Harman, MD;  Location: [facility] INVASIVE CV LAB;  Service: Cardiovascular;  Laterality: N/A;   ABDOMINAL AORTOGRAM W/LOWER EXTREMITY N/A 03/05/2023   Procedure: ABDOMINAL AORTOGRAM W/LOWER EXTREMITY;  Surgeon: Maeola Harman, MD;  Location: [facility] INVASIVE CV LAB;  Service: Cardiovascular;  Laterality: N/A;   AMPUTATION Right 03/09/2023   Procedure: AMPUTATION ABOVE KNEE;  Surgeon: Maeola Harman, MD;  Location: [facility] OR;  Service: Vascular;  Laterality: Right;   COLONOSCOPY N/A 08/25/2020   Procedure: COLONOSCOPY;  Surgeon: Corbin Ade, MD;  Location: [facility] ENDO SUITE;  Service: Endoscopy;  Laterality: N/A;  9:30   COLONOSCOPY     CYSTOSCOPY WITH LITHOLAPAXY N/A 05/18/2021   Procedure: CYSTOSCOPY WITH LITHOLAPAXY;  Surgeon: Rene Paci, MD;  Location: [facility];  Service: Urology;  Laterality: N/A;  ONLY NEEDS  30 MIN   FEMORAL-TIBIAL BYPASS GRAFT Right 12/12/2022   Procedure: RIGHT COMMON FEMORAL-POSTERIOR TIBIAL ARTERY BYPASS WITH VEIN HARVESTING OF THE GREATER SAPHENOUS VEIN AND FEMORAL ENDARTERECTOMY WITH COMPOSITE GRAFT;  Surgeon: Maeola Harman, MD;  Location: [facility] OR;  Service: Vascular;  Laterality: Right;   POLYPECTOMY  08/25/2020   Procedure: POLYPECTOMY;  Surgeon: Corbin Ade, MD;  Location: [facility] SUITE;  Service: Endoscopy;;   ROBOT ASSISTED LAPAROSCOPIC RADICAL PROSTATECTOMY  2011   TONSILLECTOMY  age 3   and adenoids removed    Review of Systems  Constitutional:  Negative for chills and fever.  Eyes:  Negative for blurred vision.   Respiratory:  Negative for shortness of breath.   Cardiovascular:  Negative for chest pain.  Gastrointestinal:  Negative for abdominal pain, nausea and vomiting.  Genitourinary:  Negative for dysuria and hematuria.  Musculoskeletal:  Negative for back pain.  Skin:  Negative for rash.  Neurological:  Negative for dizziness and headaches.  Endo/Heme/Allergies:  Negative for polydipsia.      Objective    BP 115/75   Pulse 77   Ht  (1.778 m)   Wt 206 lb (93.4 kg)   SpO2 94%   BMI 29.56 kg/m   Physical Exam Vitals reviewed.  Constitutional:      General: He is not in acute distress.    Appearance: Normal appearance. He is not ill-appearing, toxic-appearing or diaphoretic.  HENT:     Head: Normocephalic.  Eyes:     General:        Right eye: No discharge.        Left eye: No discharge.     Conjunctiva/sclera: Conjunctivae normal.  Cardiovascular:     Rate and Rhythm: Normal rate.     Pulses: Normal pulses.     Heart sounds: Normal heart sounds.  Pulmonary:     Effort: Pulmonary effort is normal. No respiratory distress.     Breath sounds: Normal breath sounds.  Abdominal:     General: Bowel sounds are normal.     Palpations: Abdomen is soft.     Tenderness: There is no abdominal tenderness. There is no right CVA tenderness, left CVA tenderness or guarding.  Musculoskeletal:     Cervical back: Normal range of motion.     Comments: AMPUTATION ABOVE KNEE (Right: Knee) Patient on wheelchair  Skin:    General: Skin is warm and dry.     Capillary Refill: Capillary refill takes less than 2 seconds.  Neurological:     General: No focal deficit present.     Mental Status: He is alert and oriented to person, place, and time.     Coordination: Coordination normal.     Gait: Gait normal.  Psychiatric:        Mood and Affect: Mood normal.        Behavior: Behavior normal.        Thought Content: Thought content normal.        Judgment: Judgment normal.        Assessment & Plan:  Primary hypertension -     CBC with Differential/Platelet -     CMP14+EGFR -     Microalbumin / creatinine urine ratio  Screening for lipid disorders -     Lipid panel  Screening for thyroid disorder -     TSH + free T4  Need for hepatitis C screening test -     Hepatitis C antibody  Encounter for vitamin deficiency screening -     VITAMIN D 25 Hydroxy (Vit-D Deficiency, Fractures)  Screening for diabetes mellitus  Essential hypertension Assessment & Plan: Vitals:   04/10/23 0812 04/10/23 0840  BP: 111/69 115/75  Blood pressure controlled in today's visit, Labs ordered Continue  taking metoprolol 12.5 twice daily Discussed DASH diet which includes vegetables,fruits,whole grains, fat free or low fat diary,fish,poultry,beans,nuts and seeds,vegetable oils. Find an activity that you will enjoy and start to be active at least 5 days a week for 30 minutes each day.    Stage 3b chronic kidney disease Assessment & Plan: BMP+eGFR ordered in today's visit- Patient reported following up with outpatient nephrology.  Discussed to keep hypertension controlled, maintain blood pressure reading goals under 130/80, take your daily blood pressure medications. Keep your cholesterol under control to prevent further damage to blood vessels. Avoid NSAIDs medications and take tylenol for pain management.Consume a kidney friendly diet which includes Veggies: cauliflower, onions, eggplant, turnips. Proteins: lean meats (poultry, fish), eggs, unsalted seafood. Avoid fatty foods, limit or avoid smoking and alcohol intake. Maintain an excercise routine to minimum of 150 minuties a week.     Return in about 3 months (around 07/10/2023) for chronic follow-up, hypertension, routine labs.   Cruzita Lederer Newman Nip, FNP

## 2023-04-10 NOTE — Assessment & Plan Note (Signed)
Vitals:   04/10/23 0812 04/10/23 0840  BP: 111/69 115/75  Blood pressure controlled in today's visit, Labs ordered Continue taking metoprolol 12.5 twice daily Discussed DASH diet which includes vegetables,fruits,whole grains, fat free or low fat diary,fish,poultry,beans,nuts and seeds,vegetable oils. Find an activity that you will enjoy and start to be active at least 5 days a week for 30 minutes each day.

## 2023-04-10 NOTE — Assessment & Plan Note (Addendum)
BMP+eGFR ordered in today's visit- Patient reported following up with outpatient nephrology.  Discussed to keep hypertension controlled, maintain blood pressure reading goals under 130/80, take your daily blood pressure medications. Keep your cholesterol under control to prevent further damage to blood vessels. Avoid NSAIDs medications and take tylenol for pain management.Consume a kidney friendly diet which includes Veggies: cauliflower, onions, eggplant, turnips. Proteins: lean meats (poultry, fish), eggs, unsalted seafood. Avoid fatty foods, limit or avoid smoking and alcohol intake. Maintain an excercise routine to minimum of 150 minuties a week.

## 2023-04-12 DIAGNOSIS — K219 Gastro-esophageal reflux disease without esophagitis: Secondary | ICD-10-CM | POA: Diagnosis not present

## 2023-04-12 DIAGNOSIS — Z9181 History of falling: Secondary | ICD-10-CM | POA: Diagnosis not present

## 2023-04-12 DIAGNOSIS — D62 Acute posthemorrhagic anemia: Secondary | ICD-10-CM | POA: Diagnosis not present

## 2023-04-12 DIAGNOSIS — Z89611 Acquired absence of right leg above knee: Secondary | ICD-10-CM | POA: Diagnosis not present

## 2023-04-12 DIAGNOSIS — N183 Chronic kidney disease, stage 3 unspecified: Secondary | ICD-10-CM | POA: Diagnosis not present

## 2023-04-12 DIAGNOSIS — I739 Peripheral vascular disease, unspecified: Secondary | ICD-10-CM | POA: Diagnosis not present

## 2023-04-12 DIAGNOSIS — Z4781 Encounter for orthopedic aftercare following surgical amputation: Secondary | ICD-10-CM | POA: Diagnosis not present

## 2023-04-12 DIAGNOSIS — Z7982 Long term (current) use of aspirin: Secondary | ICD-10-CM | POA: Diagnosis not present

## 2023-04-12 DIAGNOSIS — E785 Hyperlipidemia, unspecified: Secondary | ICD-10-CM | POA: Diagnosis not present

## 2023-04-12 DIAGNOSIS — G546 Phantom limb syndrome with pain: Secondary | ICD-10-CM | POA: Diagnosis not present

## 2023-04-12 DIAGNOSIS — Z87442 Personal history of urinary calculi: Secondary | ICD-10-CM | POA: Diagnosis not present

## 2023-04-12 DIAGNOSIS — N1330 Unspecified hydronephrosis: Secondary | ICD-10-CM | POA: Diagnosis not present

## 2023-04-12 DIAGNOSIS — Z87891 Personal history of nicotine dependence: Secondary | ICD-10-CM | POA: Diagnosis not present

## 2023-04-12 DIAGNOSIS — I129 Hypertensive chronic kidney disease with stage 1 through stage 4 chronic kidney disease, or unspecified chronic kidney disease: Secondary | ICD-10-CM | POA: Diagnosis not present

## 2023-04-13 LAB — TSH+FREE T4
Free T4: 1.11 ng/dL (ref 0.82–1.77)
TSH: 1.72 u[IU]/mL (ref 0.450–4.500)

## 2023-04-13 LAB — MICROALBUMIN / CREATININE URINE RATIO
Creatinine, Urine: 77 mg/dL
Microalb/Creat Ratio: 4 mg/g creat (ref 0–29)
Microalbumin, Urine: 3.2 ug/mL

## 2023-04-13 LAB — LIPID PANEL
Chol/HDL Ratio: 3.3 ratio (ref 0.0–5.0)
Cholesterol, Total: 126 mg/dL (ref 100–199)
HDL: 38 mg/dL — ABNORMAL LOW (ref >39)
LDL Chol Calc (NIH): 63 mg/dL (ref 0–99)
Triglycerides: 146 mg/dL (ref 0–149)
VLDL Cholesterol Cal: 25 mg/dL (ref 5–40)

## 2023-04-13 LAB — CMP14+EGFR
ALT: 13 IU/L (ref 0–44)
AST: 12 IU/L (ref 0–40)
Albumin/Globulin Ratio: 1.7 (ref 1.2–2.2)
Albumin: 4 g/dL (ref 3.8–4.8)
Alkaline Phosphatase: 120 IU/L (ref 44–121)
BUN/Creatinine Ratio: 20 (ref 10–24)
BUN: 27 mg/dL (ref 8–27)
Bilirubin Total: 0.2 mg/dL (ref 0.0–1.2)
CO2: 23 mmol/L (ref 20–29)
Calcium: 10.3 mg/dL — ABNORMAL HIGH (ref 8.6–10.2)
Chloride: 102 mmol/L (ref 96–106)
Creatinine, Ser: 1.36 mg/dL — ABNORMAL HIGH (ref 0.76–1.27)
Globulin, Total: 2.4 g/dL (ref 1.5–4.5)
Glucose: 118 mg/dL — ABNORMAL HIGH (ref 70–99)
Potassium: 4.8 mmol/L (ref 3.5–5.2)
Sodium: 140 mmol/L (ref 134–144)
Total Protein: 6.4 g/dL (ref 6.0–8.5)
eGFR: 55 mL/min/{1.73_m2} — ABNORMAL LOW (ref >59)

## 2023-04-13 LAB — CBC WITH DIFFERENTIAL/PLATELET
Basophils Absolute: 0.1 10*3/uL (ref 0.0–0.2)
Basos: 1 %
EOS (ABSOLUTE): 0.4 10*3/uL (ref 0.0–0.4)
Eos: 6 %
Hematocrit: 30.7 % — ABNORMAL LOW (ref 37.5–51.0)
Hemoglobin: 9.7 g/dL — ABNORMAL LOW (ref 13.0–17.7)
Immature Grans (Abs): 0 10*3/uL (ref 0.0–0.1)
Immature Granulocytes: 0 %
Lymphocytes Absolute: 1.6 10*3/uL (ref 0.7–3.1)
Lymphs: 21 %
MCH: 28.9 pg (ref 26.6–33.0)
MCHC: 31.6 g/dL (ref 31.5–35.7)
MCV: 91 fL (ref 79–97)
Monocytes Absolute: 0.5 10*3/uL (ref 0.1–0.9)
Monocytes: 7 %
Neutrophils Absolute: 5 10*3/uL (ref 1.4–7.0)
Neutrophils: 65 %
Platelets: 356 10*3/uL (ref 150–450)
RBC: 3.36 x10E6/uL — ABNORMAL LOW (ref 4.14–5.80)
RDW: 14.9 % (ref 11.6–15.4)
WBC: 7.6 10*3/uL (ref 3.4–10.8)

## 2023-04-13 LAB — VITAMIN D 25 HYDROXY (VIT D DEFICIENCY, FRACTURES): Vit D, 25-Hydroxy: 41.3 ng/mL (ref 30.0–100.0)

## 2023-04-13 LAB — HEPATITIS C ANTIBODY: Hep C Virus Ab: NONREACTIVE

## 2023-04-17 DIAGNOSIS — Z7982 Long term (current) use of aspirin: Secondary | ICD-10-CM | POA: Diagnosis not present

## 2023-04-17 DIAGNOSIS — Z9181 History of falling: Secondary | ICD-10-CM | POA: Diagnosis not present

## 2023-04-17 DIAGNOSIS — Z4781 Encounter for orthopedic aftercare following surgical amputation: Secondary | ICD-10-CM | POA: Diagnosis not present

## 2023-04-17 DIAGNOSIS — Z87891 Personal history of nicotine dependence: Secondary | ICD-10-CM | POA: Diagnosis not present

## 2023-04-17 DIAGNOSIS — N183 Chronic kidney disease, stage 3 unspecified: Secondary | ICD-10-CM | POA: Diagnosis not present

## 2023-04-17 DIAGNOSIS — G546 Phantom limb syndrome with pain: Secondary | ICD-10-CM | POA: Diagnosis not present

## 2023-04-17 DIAGNOSIS — D62 Acute posthemorrhagic anemia: Secondary | ICD-10-CM | POA: Diagnosis not present

## 2023-04-17 DIAGNOSIS — E785 Hyperlipidemia, unspecified: Secondary | ICD-10-CM | POA: Diagnosis not present

## 2023-04-17 DIAGNOSIS — I739 Peripheral vascular disease, unspecified: Secondary | ICD-10-CM | POA: Diagnosis not present

## 2023-04-17 DIAGNOSIS — K219 Gastro-esophageal reflux disease without esophagitis: Secondary | ICD-10-CM | POA: Diagnosis not present

## 2023-04-17 DIAGNOSIS — Z89611 Acquired absence of right leg above knee: Secondary | ICD-10-CM | POA: Diagnosis not present

## 2023-04-17 DIAGNOSIS — I129 Hypertensive chronic kidney disease with stage 1 through stage 4 chronic kidney disease, or unspecified chronic kidney disease: Secondary | ICD-10-CM | POA: Diagnosis not present

## 2023-04-17 DIAGNOSIS — Z87442 Personal history of urinary calculi: Secondary | ICD-10-CM | POA: Diagnosis not present

## 2023-04-17 DIAGNOSIS — N1330 Unspecified hydronephrosis: Secondary | ICD-10-CM | POA: Diagnosis not present

## 2023-04-18 ENCOUNTER — Ambulatory Visit (INDEPENDENT_AMBULATORY_CARE_PROVIDER_SITE_OTHER): Payer: Medicare Other | Admitting: Physician Assistant

## 2023-04-18 VITALS — BP 135/70 | HR 57 | Temp 98.2°F | Resp 20 | Ht 70.0 in

## 2023-04-18 DIAGNOSIS — Z89611 Acquired absence of right leg above knee: Secondary | ICD-10-CM

## 2023-04-18 NOTE — Progress Notes (Signed)
POST OPERATIVE OFFICE NOTE    CC:  F/u for surgery  HPI:  [name] is a 72 y.o. male who is s/p right above-knee amputation by Dr.Cain on 03/09/2023.  The patient unfortunately had necrosis of his right foot and ankle, with history of failed right lower extremity bypass.  Pt returns today for follow up.  He has been doing okay since surgery.  He denies any issues with his incision.  He denies any drainage or erythema.  He tolerated physical therapy well in the hospital and is ready to be fitted for a prosthetic soon.  He endorses daily phantom pains in the right lower extremity, which is usually controlled with gabapentin.   Allergies  Allergen Reactions   Shellfish Allergy Swelling    Current Outpatient Medications  Medication Sig Dispense Refill   acetaminophen (TYLENOL) 325 MG tablet Take 1-2 tablets (325-650 mg total) by mouth every 4 (four) hours as needed for mild pain.     aspirin EC 81 MG tablet Take 81 mg by mouth daily.     atorvastatin (LIPITOR) 40 MG tablet Take 1 tablet (40 mg total) by mouth every morning. 30 tablet 0   cyclobenzaprine (FLEXERIL) 5 MG tablet Take 1 tablet (5 mg total) by mouth 3 (three) times daily as needed for muscle spasms. 30 tablet 0   gabapentin (NEURONTIN) 100 MG capsule Take 1 capsule (100 mg total) by mouth daily. 30 capsule 0   gabapentin (NEURONTIN) 300 MG capsule Take 1 capsule (300 mg total) by mouth at bedtime. 30 capsule 0   iron polysaccharides (NIFEREX) 150 MG capsule Take 1 capsule (150 mg total) by mouth daily. 30 capsule 0   lisinopril-hydrochlorothiazide (ZESTORETIC) 10-12.5 MG tablet Take 1 tablet by mouth daily.     metoprolol tartrate (LOPRESSOR) 25 MG tablet Take 0.5 tablets (12.5 mg total) by mouth 2 (two) times daily. 30 tablet 0   Multiple Vitamins-Minerals (CENTRUM SILVER PO) Take 1 tablet by mouth daily.      oxybutynin (DITROPAN-XL) 10 MG 24 hr tablet Take 10 mg by mouth daily.     Oxycodone HCl 10 MG TABS Take 1  tablet (10 mg total) by mouth 3 (three) times daily as needed. 2 tablet 0   pantoprazole (PROTONIX) 40 MG tablet Take 1 tablet (40 mg total) by mouth daily. 30 tablet 0   polyethylene glycol powder (MIRALAX) 17 GM/SCOOP powder Take 17 g by mouth daily as needed for mild constipation. 255 g 0   senna-docusate (SENOKOT-S) 8.6-50 MG tablet Take 2 tablets by mouth daily after supper. 30 tablet 0   sodium bicarbonate 650 MG tablet Take 1 tablet (650 mg total) by mouth 2 (two) times daily. 60 tablet 0   No current facility-administered medications for this visit.     ROS:  See HPI  Physical Exam:  Incision: Right above-knee amputation site nearly healed, all staples were removed.  Small superficial opening of stump midline with healthy appearing tissue and no drainage. Extremities: Warm and well-perfused left lower extremity Neuro: Intact motor and sensation of BLE    Assessment/Plan:  This is a 72 y.o. male who is s/p: Right above-knee amputation on 03/09/2023   -The patient underwent right above-knee amputation after failed right lower extremity bypass and progressive necrosis of the right foot and ankle -He has been doing well since surgery.  He denies any issues with his incision.  All staples were removed today without any signs of infection or drainage.  There is a small  superficial opening at the midline portion of the stump, with healthy appearing tissue.  I encouraged the patient to clean this daily and put a dry dressing on top. -He is requesting a new shrinker for his right AKA stump, since it is difficult for him to wear the current stump sock that attaches to his hip.  I will write a prescription to [facility] for a shrinker sock.  I will also write his prescription for a right leg prosthetic and liner, however the patient is not ready to be fitted for the prosthetic until his incision fully heals -The patient will return to our office in 2 weeks for repeat right AKA wound check.  If his  incision has healed by then, [facility] can proceed with fitting him for a prosthetic.   The patient has a right Above Knee Amputation. The patient is well motivated to return to their prior functional status by utilizing a prosthesis to perform ADL's and maintain a healthy lifestyle. The patient has the physical and cognitive capacity to function with a prosthesis.   Functional Level: K2 Limited Community Ambulator: Has the ability or potential for ambulation and to traverse low environmental barriers such as curbs, stairs or uneven surfaces  Residual Limb History: The skin condition of the residual limb is intact. The patient will continue to monitor the skin of the residual limb and follow hygiene instructions.  The patient is experiencing phantom limb pain  and residual limb pain  Prosthetic Prescription Plan: Counseling and education regarding prosthetic management will be provided to the patient via a certified prosthetist. A multi-discipline team, including physical therapy, will manage the prosthetic fabrication, fitting and prosthetic gait training.     Loel Dubonnet, PA-C [facility] [phone]   Clinic MD:  Randie Heinz

## 2023-04-19 ENCOUNTER — Other Ambulatory Visit: Payer: Self-pay | Admitting: Physical Medicine and Rehabilitation

## 2023-04-19 DIAGNOSIS — G546 Phantom limb syndrome with pain: Secondary | ICD-10-CM | POA: Diagnosis not present

## 2023-04-19 DIAGNOSIS — I129 Hypertensive chronic kidney disease with stage 1 through stage 4 chronic kidney disease, or unspecified chronic kidney disease: Secondary | ICD-10-CM | POA: Diagnosis not present

## 2023-04-19 DIAGNOSIS — N183 Chronic kidney disease, stage 3 unspecified: Secondary | ICD-10-CM | POA: Diagnosis not present

## 2023-04-19 DIAGNOSIS — D62 Acute posthemorrhagic anemia: Secondary | ICD-10-CM | POA: Diagnosis not present

## 2023-04-19 DIAGNOSIS — Z7982 Long term (current) use of aspirin: Secondary | ICD-10-CM | POA: Diagnosis not present

## 2023-04-19 DIAGNOSIS — N1330 Unspecified hydronephrosis: Secondary | ICD-10-CM | POA: Diagnosis not present

## 2023-04-19 DIAGNOSIS — Z4781 Encounter for orthopedic aftercare following surgical amputation: Secondary | ICD-10-CM | POA: Diagnosis not present

## 2023-04-19 DIAGNOSIS — E785 Hyperlipidemia, unspecified: Secondary | ICD-10-CM | POA: Diagnosis not present

## 2023-04-19 DIAGNOSIS — Z87442 Personal history of urinary calculi: Secondary | ICD-10-CM | POA: Diagnosis not present

## 2023-04-19 DIAGNOSIS — Z87891 Personal history of nicotine dependence: Secondary | ICD-10-CM | POA: Diagnosis not present

## 2023-04-19 DIAGNOSIS — Z9181 History of falling: Secondary | ICD-10-CM | POA: Diagnosis not present

## 2023-04-19 DIAGNOSIS — Z89611 Acquired absence of right leg above knee: Secondary | ICD-10-CM | POA: Diagnosis not present

## 2023-04-19 DIAGNOSIS — K219 Gastro-esophageal reflux disease without esophagitis: Secondary | ICD-10-CM | POA: Diagnosis not present

## 2023-04-19 DIAGNOSIS — I739 Peripheral vascular disease, unspecified: Secondary | ICD-10-CM | POA: Diagnosis not present

## 2023-04-21 DIAGNOSIS — N183 Chronic kidney disease, stage 3 unspecified: Secondary | ICD-10-CM | POA: Diagnosis not present

## 2023-04-21 DIAGNOSIS — S78111A Complete traumatic amputation at level between right hip and knee, initial encounter: Secondary | ICD-10-CM | POA: Diagnosis not present

## 2023-04-22 ENCOUNTER — Other Ambulatory Visit: Payer: Self-pay | Admitting: Physical Medicine and Rehabilitation

## 2023-04-23 DIAGNOSIS — Z9181 History of falling: Secondary | ICD-10-CM | POA: Diagnosis not present

## 2023-04-23 DIAGNOSIS — I739 Peripheral vascular disease, unspecified: Secondary | ICD-10-CM | POA: Diagnosis not present

## 2023-04-23 DIAGNOSIS — Z4781 Encounter for orthopedic aftercare following surgical amputation: Secondary | ICD-10-CM | POA: Diagnosis not present

## 2023-04-23 DIAGNOSIS — Z87891 Personal history of nicotine dependence: Secondary | ICD-10-CM | POA: Diagnosis not present

## 2023-04-23 DIAGNOSIS — K219 Gastro-esophageal reflux disease without esophagitis: Secondary | ICD-10-CM | POA: Diagnosis not present

## 2023-04-23 DIAGNOSIS — Z87442 Personal history of urinary calculi: Secondary | ICD-10-CM | POA: Diagnosis not present

## 2023-04-23 DIAGNOSIS — Z89611 Acquired absence of right leg above knee: Secondary | ICD-10-CM | POA: Diagnosis not present

## 2023-04-23 DIAGNOSIS — G546 Phantom limb syndrome with pain: Secondary | ICD-10-CM | POA: Diagnosis not present

## 2023-04-23 DIAGNOSIS — N1330 Unspecified hydronephrosis: Secondary | ICD-10-CM | POA: Diagnosis not present

## 2023-04-23 DIAGNOSIS — E785 Hyperlipidemia, unspecified: Secondary | ICD-10-CM | POA: Diagnosis not present

## 2023-04-23 DIAGNOSIS — D62 Acute posthemorrhagic anemia: Secondary | ICD-10-CM | POA: Diagnosis not present

## 2023-04-23 DIAGNOSIS — I129 Hypertensive chronic kidney disease with stage 1 through stage 4 chronic kidney disease, or unspecified chronic kidney disease: Secondary | ICD-10-CM | POA: Diagnosis not present

## 2023-04-23 DIAGNOSIS — N183 Chronic kidney disease, stage 3 unspecified: Secondary | ICD-10-CM | POA: Diagnosis not present

## 2023-04-23 DIAGNOSIS — Z7982 Long term (current) use of aspirin: Secondary | ICD-10-CM | POA: Diagnosis not present

## 2023-04-25 ENCOUNTER — Encounter
Payer: Medicare Other | Attending: Physical Medicine and Rehabilitation | Admitting: Physical Medicine and Rehabilitation

## 2023-04-25 ENCOUNTER — Encounter: Payer: Self-pay | Admitting: Physical Medicine and Rehabilitation

## 2023-04-25 VITALS — BP 127/62 | HR 63 | Ht 70.0 in | Wt 206.0 lb

## 2023-04-25 DIAGNOSIS — S78111A Complete traumatic amputation at level between right hip and knee, initial encounter: Secondary | ICD-10-CM | POA: Insufficient documentation

## 2023-04-25 MED ORDER — GABAPENTIN 300 MG PO CAPS: 300.0000 mg | ORAL_CAPSULE | Freq: Every day | ORAL | 5 refills | Status: DC

## 2023-04-25 MED ORDER — GABAPENTIN 100 MG PO CAPS: 100.0000 mg | ORAL_CAPSULE | Freq: Every day | ORAL | 5 refills | Status: DC

## 2023-04-25 MED ORDER — CYCLOBENZAPRINE HCL 5 MG PO TABS: 5.0000 mg | ORAL_TABLET | Freq: Three times a day (TID) | ORAL | 5 refills | Status: DC | PRN

## 2023-04-25 MED ORDER — METOPROLOL TARTRATE 25 MG PO TABS: 12.5000 mg | ORAL_TABLET | Freq: Two times a day (BID) | ORAL | 0 refills | Status: DC

## 2023-04-25 NOTE — Progress Notes (Signed)
Subjective:    Patient ID: [name], male    DOB: [birth year], 72 y.o.   MRN: [id number]  HPI Pt is a 72 yr old male with hx of recent R AKA 03/05/23 due to PAD;  phantom/residual limb pain; also has HTN, CKD- latest Cr 1.73;  Acute on chronic anemia/ABLA; prediabetes, GERD Here for hospital f/u on R AKA.   Things doing "OK" Just had a phantom pain- sharp pain down to R foot- usually last 1-2 minutes-  usually occurs ~1x/day.   Usually occurs at night.   Still taking Gabapentin- out of 300 mg   Saw new PCP in [place], closer to him- saw them- 2 weeks ago- saw her- NP- Tenna Child Del Orce Polanco-  Dr Loleta Chance  Has run out of Metoprolol as well as MVI.   Saw Surgeon 2 weeks ago- saw PA- removed staples-   Pain Inventory Average Pain 10 Pain Right Now 0 My pain is intermittent and sharp  In the last 24 hours, has pain interfered with the following? General activity 0 Relation with others 0 Enjoyment of life 0 What TIME of day is your pain at its worst? night Sleep (in general) NA  Pain is worse with: sitting Pain improves with: rest Relief from Meds: 6  use a walker how many minutes can you walk? 20 ability to climb steps?  yes do you drive?  no use a wheelchair transfers alone  not employed: date last employed . I need assistance with the following:  meal prep, household duties, and shopping  trouble walking  Any changes since last visit?  no   PCP  Del Newman Nip, Tenna Child, FNP     Family History  Problem Relation Age of Onset   Gastric cancer Mother    Prostate cancer Father    Breast cancer Neg Hx    Pancreatic cancer Neg Hx    Social History   Socioeconomic History   Marital status: Single    Spouse name: Not on file   Number of children: 1   Years of education: Not on file   Highest education level: Not on file  Occupational History    Comment: customer service   Tobacco Use   Smoking status: Former    Packs/day: 0.25    Years:  21.00    Additional pack years: 0.00    Total pack years: 5.25    Types: Cigarettes    Quit date: 11/29/2022    Years since quitting: 0.4    Passive exposure: Never   Smokeless tobacco: Never  Vaping Use   Vaping Use: Never used  Substance and Sexual Activity   Alcohol use: Never   Drug use: Never   Sexual activity: Not Currently  Other Topics Concern   Not on file  Social History Narrative   Has one son.    Social Determinants of Health   Financial Resource Strain: Not on file  Food Insecurity: No Food Insecurity (12/12/2022)   Hunger Vital Sign    Worried About Running Out of Food in the Last Year: Never true    Ran Out of Food in the Last Year: Never true  Transportation Needs: No Transportation Needs (12/12/2022)   PRAPARE - Administrator, Civil Service (Medical): No    Lack of Transportation (Non-Medical): No  Physical Activity: Not on file  Stress: Not on file  Social Connections: Not on file   Past Surgical History:  Procedure Laterality Date   ABDOMINAL  AORTOGRAM W/LOWER EXTREMITY N/A 11/20/2022   Procedure: ABDOMINAL AORTOGRAM W/LOWER EXTREMITY;  Surgeon: Maeola Harman, MD;  Location: [facility] INVASIVE CV LAB;  Service: Cardiovascular;  Laterality: N/A;   ABDOMINAL AORTOGRAM W/LOWER EXTREMITY N/A 03/05/2023   Procedure: ABDOMINAL AORTOGRAM W/LOWER EXTREMITY;  Surgeon: Maeola Harman, MD;  Location: [facility] INVASIVE CV LAB;  Service: Cardiovascular;  Laterality: N/A;   AMPUTATION Right 03/09/2023   Procedure: AMPUTATION ABOVE KNEE;  Surgeon: Maeola Harman, MD;  Location: [facility] OR;  Service: Vascular;  Laterality: Right;   COLONOSCOPY N/A 08/25/2020   Procedure: COLONOSCOPY;  Surgeon: Corbin Ade, MD;  Location: [facility];  Service: Endoscopy;  Laterality: N/A;  9:30   COLONOSCOPY     CYSTOSCOPY WITH LITHOLAPAXY N/A 05/18/2021   Procedure: CYSTOSCOPY WITH LITHOLAPAXY;  Surgeon: Rene Paci, MD;  Location: [facility];  Service: Urology;  Laterality: N/A;  ONLY NEEDS  30 MIN   FEMORAL-TIBIAL BYPASS GRAFT Right 12/12/2022   Procedure: RIGHT COMMON FEMORAL-POSTERIOR TIBIAL ARTERY BYPASS WITH VEIN HARVESTING OF THE GREATER SAPHENOUS VEIN AND FEMORAL ENDARTERECTOMY WITH COMPOSITE GRAFT;  Surgeon: Maeola Harman, MD;  Location: [facility] OR;  Service: Vascular;  Laterality: Right;   POLYPECTOMY  08/25/2020   Procedure: POLYPECTOMY;  Surgeon: Corbin Ade, MD;  Location: [facility];  Service: Endoscopy;;   ROBOT ASSISTED LAPAROSCOPIC RADICAL PROSTATECTOMY  2011   TONSILLECTOMY  age 53   and adenoids removed   Past Medical History:  Diagnosis Date   Family history of metabolic acidosis with increased anion gap 03/05/2023   GERD (gastroesophageal reflux disease)    History of kidney stones    Hyperlipidemia    Hypertension    Prostate cancer [facility]) 2011   radiation june 2020   Umbilical hernia    Wears dentures    full   Wears glasses    Wears partial dentures    lower   BP 127/62   Pulse 63   Ht 5\' 10"  (1.778 m)   Wt 206 lb (93.4 kg) Comment: last recorded  SpO2 94%   BMI 29.56 kg/m   Opioid Risk Score:   Fall Risk Score:  `1  Depression screen [facility] 2/9     04/25/2023   11:07 AM 04/10/2023    8:14 AM  Depression screen PHQ 2/9  Decreased Interest 0 0  Down, Depressed, Hopeless 0 0  PHQ - 2 Score 0 0  Altered sleeping 0 0  Tired, decreased energy 0 0  Change in appetite 0 0  Feeling bad or failure about yourself  0 0  Trouble concentrating 0 0  Moving slowly or fidgety/restless 0 0  Suicidal thoughts 0 0  PHQ-9 Score 0 0  Difficult doing work/chores  Not difficult at all     Review of Systems  Constitutional: Negative.   HENT: Negative.    Eyes: Negative.   Respiratory: Negative.    Cardiovascular: Negative.   Gastrointestinal: Negative.   Endocrine: Negative.   Genitourinary: Negative.   Musculoskeletal:  Positive for gait problem.  Skin: Negative.    Allergic/Immunologic: Negative.   Neurological:        Phantom limb pain  Hematological: Negative.   Psychiatric/Behavioral: Negative.    All other systems reviewed and are negative.      Objective:   Physical Exam  Awake, alert, appropriate, in w/c; using shrinker, accompanied by sister, NAD  R AKA looks great- staples out- healed completely except 1 tiny area- a tiny bit  of drainage with tape/bandage in place- in middle- basically a divet in the skin and not bulbous anymore- basically straight, not tapered at this time- but dog ears gone.       Assessment & Plan:   Pt is a 72 yr old male with hx of recent R AKA 03/05/23 due to PAD;  phantom/residual limb pain; also has HTN, CKD- latest Cr 1.73;  Acute on chronic anemia/ABLA; prediabetes, GERD Here for hospital f/u on R AKA.   Called PCP- switching PCP's- called pharmacy and is out of BP meds- hasn't heard back.   2. It increases risk of stroke if he doesn't get BP meds.    3. Will give Metoprolol 12.5 mg 2x/day- for 30 days- but needs to get from PCP in future. As well as Lisinopril/HCTZ  which last got 4/18-   4. Has appointment with [facility] 5/14 to be evaluated for Prosthesis- - will be measured and fit on that day and likely next time I see him, he will be wearing prosthesis.    5. Ask Dr Randie Heinz to have therapy after get prosthesis in [place] or closer than [facility].  Because he doesn't drive.    6. We can discuss about using L foot to drive- or can get him hand controls- so can get back to driving if he wants to.    7. I am the SCI rehab doctor for the area- if you are interested in nephew seeing me.    8. Refilled gabapentin 100 mg in Am and 300 mg nightly- refilled and gave 6 months supply.   9. Refilled Flexeril- muscle relaxant- 10 mg 3x/day as needed- #90- 5 refills.    10. F/U in 3 months- I'm here to help with prosthesis issues once Dr Randie Heinz signs off on him.   11. Went over prosthesis - has socket-  which initially is clear- so can see if any skin issues- is temporary but then you get to pick out socket- has electric knee and moveable foot. Goal of prosthesis to get back walking like you were. When you go to see [facility]-  tell them what you like to do.   12. Filled out handicapped placard. -    I spent a total of 36    minutes on total care today- >50% coordination of care- due to  D/w pt and sister about prosthesis- and educating on it and handicapped placard.

## 2023-04-25 NOTE — Patient Instructions (Addendum)
Pt is a 72 yr old male with hx of recent R AKA 03/05/23 due to PAD;  phantom/residual limb pain; also has HTN, CKD- latest Cr 1.73;  Acute on chronic anemia/ABLA; prediabetes, GERD Here for hospital f/u on R AKA.   Called PCP- switching PCP's- called pharmacy and is out of BP meds- hasn't heard back.   2. It increases risk of stroke if he doesn't get BP meds.    3. Will give Metoprolol 12.5 mg 2x/day- for 30 days- but needs to get from PCP in future. As well as Lisinopril/HCTZ  which last got 4/18-   4. Has appointment with [facility] 5/14 to be evaluated for Prosthesis- - will be measured and fit on that day and likely next time I see him, he will be wearing prosthesis.    5. Ask Dr Randie Heinz to have therapy after get prosthesis in [place] or closer than [facility].  Because he doesn't drive.    6. We can discuss about using L foot to drive- or can get him hand controls- so can get back to driving if he wants to.    7. I am the SCI rehab doctor for the area- if you are interested in nephew seeing me.    8. Refilled gabapentin 100 mg in Am and 300 mg nightly- refilled and gave 6 months supply.   9. Refilled Flexeril- muscle relaxant- 10 mg 3x/day as needed- #90- 5 refills.    10. F/U in 3 months- I'm here to help with prosthesis issues once Dr Randie Heinz signs off on him.   11. Went over prosthesis - has socket- which initially is clear- so can see if any skin issues- is temporary but then you get to pick out socket- has electric knee and moveable foot. Goal of prosthesis to get back walking like you were. When you go to see [facility]-  tell them what you like to do.   12. Filled out handicapped placard. -

## 2023-04-26 DIAGNOSIS — N183 Chronic kidney disease, stage 3 unspecified: Secondary | ICD-10-CM | POA: Diagnosis not present

## 2023-04-26 DIAGNOSIS — N1330 Unspecified hydronephrosis: Secondary | ICD-10-CM | POA: Diagnosis not present

## 2023-04-26 DIAGNOSIS — K219 Gastro-esophageal reflux disease without esophagitis: Secondary | ICD-10-CM | POA: Diagnosis not present

## 2023-04-26 DIAGNOSIS — Z87891 Personal history of nicotine dependence: Secondary | ICD-10-CM | POA: Diagnosis not present

## 2023-04-26 DIAGNOSIS — Z89611 Acquired absence of right leg above knee: Secondary | ICD-10-CM | POA: Diagnosis not present

## 2023-04-26 DIAGNOSIS — E785 Hyperlipidemia, unspecified: Secondary | ICD-10-CM | POA: Diagnosis not present

## 2023-04-26 DIAGNOSIS — Z87442 Personal history of urinary calculi: Secondary | ICD-10-CM | POA: Diagnosis not present

## 2023-04-26 DIAGNOSIS — Z9181 History of falling: Secondary | ICD-10-CM | POA: Diagnosis not present

## 2023-04-26 DIAGNOSIS — I129 Hypertensive chronic kidney disease with stage 1 through stage 4 chronic kidney disease, or unspecified chronic kidney disease: Secondary | ICD-10-CM | POA: Diagnosis not present

## 2023-04-26 DIAGNOSIS — Z4781 Encounter for orthopedic aftercare following surgical amputation: Secondary | ICD-10-CM | POA: Diagnosis not present

## 2023-04-26 DIAGNOSIS — D62 Acute posthemorrhagic anemia: Secondary | ICD-10-CM | POA: Diagnosis not present

## 2023-04-26 DIAGNOSIS — I739 Peripheral vascular disease, unspecified: Secondary | ICD-10-CM | POA: Diagnosis not present

## 2023-04-26 DIAGNOSIS — G546 Phantom limb syndrome with pain: Secondary | ICD-10-CM | POA: Diagnosis not present

## 2023-04-26 DIAGNOSIS — Z7982 Long term (current) use of aspirin: Secondary | ICD-10-CM | POA: Diagnosis not present

## 2023-04-27 ENCOUNTER — Other Ambulatory Visit: Payer: Self-pay | Admitting: Physical Medicine and Rehabilitation

## 2023-04-30 DIAGNOSIS — G546 Phantom limb syndrome with pain: Secondary | ICD-10-CM | POA: Diagnosis not present

## 2023-04-30 DIAGNOSIS — Z87891 Personal history of nicotine dependence: Secondary | ICD-10-CM | POA: Diagnosis not present

## 2023-04-30 DIAGNOSIS — N1330 Unspecified hydronephrosis: Secondary | ICD-10-CM | POA: Diagnosis not present

## 2023-04-30 DIAGNOSIS — K219 Gastro-esophageal reflux disease without esophagitis: Secondary | ICD-10-CM | POA: Diagnosis not present

## 2023-04-30 DIAGNOSIS — Z7982 Long term (current) use of aspirin: Secondary | ICD-10-CM | POA: Diagnosis not present

## 2023-04-30 DIAGNOSIS — I129 Hypertensive chronic kidney disease with stage 1 through stage 4 chronic kidney disease, or unspecified chronic kidney disease: Secondary | ICD-10-CM | POA: Diagnosis not present

## 2023-04-30 DIAGNOSIS — E785 Hyperlipidemia, unspecified: Secondary | ICD-10-CM | POA: Diagnosis not present

## 2023-04-30 DIAGNOSIS — Z4781 Encounter for orthopedic aftercare following surgical amputation: Secondary | ICD-10-CM | POA: Diagnosis not present

## 2023-04-30 DIAGNOSIS — D62 Acute posthemorrhagic anemia: Secondary | ICD-10-CM | POA: Diagnosis not present

## 2023-04-30 DIAGNOSIS — Z87442 Personal history of urinary calculi: Secondary | ICD-10-CM | POA: Diagnosis not present

## 2023-04-30 DIAGNOSIS — Z9181 History of falling: Secondary | ICD-10-CM | POA: Diagnosis not present

## 2023-04-30 DIAGNOSIS — I739 Peripheral vascular disease, unspecified: Secondary | ICD-10-CM | POA: Diagnosis not present

## 2023-04-30 DIAGNOSIS — N183 Chronic kidney disease, stage 3 unspecified: Secondary | ICD-10-CM | POA: Diagnosis not present

## 2023-04-30 DIAGNOSIS — Z89611 Acquired absence of right leg above knee: Secondary | ICD-10-CM | POA: Diagnosis not present

## 2023-04-30 NOTE — Telephone Encounter (Signed)
Needs to get from PCP- I cannot refill since needs to get from family medicine or Internal medicine- thanks- ML

## 2023-05-01 ENCOUNTER — Telehealth: Payer: Self-pay | Admitting: Family Medicine

## 2023-05-01 DIAGNOSIS — K219 Gastro-esophageal reflux disease without esophagitis: Secondary | ICD-10-CM | POA: Diagnosis not present

## 2023-05-01 DIAGNOSIS — E785 Hyperlipidemia, unspecified: Secondary | ICD-10-CM | POA: Diagnosis not present

## 2023-05-01 DIAGNOSIS — I739 Peripheral vascular disease, unspecified: Secondary | ICD-10-CM | POA: Diagnosis not present

## 2023-05-01 DIAGNOSIS — N1330 Unspecified hydronephrosis: Secondary | ICD-10-CM | POA: Diagnosis not present

## 2023-05-01 DIAGNOSIS — Z87891 Personal history of nicotine dependence: Secondary | ICD-10-CM | POA: Diagnosis not present

## 2023-05-01 DIAGNOSIS — N183 Chronic kidney disease, stage 3 unspecified: Secondary | ICD-10-CM | POA: Diagnosis not present

## 2023-05-01 DIAGNOSIS — G546 Phantom limb syndrome with pain: Secondary | ICD-10-CM | POA: Diagnosis not present

## 2023-05-01 DIAGNOSIS — D62 Acute posthemorrhagic anemia: Secondary | ICD-10-CM | POA: Diagnosis not present

## 2023-05-01 DIAGNOSIS — I129 Hypertensive chronic kidney disease with stage 1 through stage 4 chronic kidney disease, or unspecified chronic kidney disease: Secondary | ICD-10-CM | POA: Diagnosis not present

## 2023-05-01 DIAGNOSIS — Z87442 Personal history of urinary calculi: Secondary | ICD-10-CM | POA: Diagnosis not present

## 2023-05-01 DIAGNOSIS — Z7982 Long term (current) use of aspirin: Secondary | ICD-10-CM | POA: Diagnosis not present

## 2023-05-01 DIAGNOSIS — Z4781 Encounter for orthopedic aftercare following surgical amputation: Secondary | ICD-10-CM | POA: Diagnosis not present

## 2023-05-01 DIAGNOSIS — Z89611 Acquired absence of right leg above knee: Secondary | ICD-10-CM | POA: Diagnosis not present

## 2023-05-01 DIAGNOSIS — Z9181 History of falling: Secondary | ICD-10-CM | POA: Diagnosis not present

## 2023-05-01 NOTE — Telephone Encounter (Signed)
[facility] called in on patient behalf    Verbal orders to [facility] 1 week 3 Address showers  Call back # [phone]

## 2023-05-02 ENCOUNTER — Ambulatory Visit (INDEPENDENT_AMBULATORY_CARE_PROVIDER_SITE_OTHER): Payer: Medicare Other | Admitting: Physician Assistant

## 2023-05-02 VITALS — BP 119/62 | HR 65 | Temp 98.0°F

## 2023-05-02 DIAGNOSIS — Z89611 Acquired absence of right leg above knee: Secondary | ICD-10-CM

## 2023-05-02 DIAGNOSIS — I70221 Atherosclerosis of native arteries of extremities with rest pain, right leg: Secondary | ICD-10-CM | POA: Diagnosis not present

## 2023-05-02 NOTE — Telephone Encounter (Signed)
Left VM giving verbal okay for orders.

## 2023-05-02 NOTE — Progress Notes (Signed)
  POST OPERATIVE OFFICE NOTE    CC:  F/u for surgery  HPI:  This is a 72 y.o. male with right lower extremity bypass status subsequently failed and progressive ischemic necrosis of the right lower extremity.  He has a small area of scabbing central lateral.  He denies fever, chills or drainage  Pt returns today for follow up.  He has a follow with [facility] for prosthetic fitting. He has is here for incision check.     Allergies  Allergen Reactions   Shellfish Allergy Swelling    Current Outpatient Medications  Medication Sig Dispense Refill   acetaminophen (TYLENOL) 325 MG tablet Take 1-2 tablets (325-650 mg total) by mouth every 4 (four) hours as needed for mild pain.     aspirin EC 81 MG tablet Take 81 mg by mouth daily.     atorvastatin (LIPITOR) 40 MG tablet Take 1 tablet (40 mg total) by mouth every morning. 30 tablet 0   cyclobenzaprine (FLEXERIL) 5 MG tablet Take 1 tablet (5 mg total) by mouth 3 (three) times daily as needed for muscle spasms. 90 tablet 5   gabapentin (NEURONTIN) 100 MG capsule Take 1 capsule (100 mg total) by mouth daily. 30 capsule 5   gabapentin (NEURONTIN) 300 MG capsule Take 1 capsule (300 mg total) by mouth at bedtime. 30 capsule 5   iron polysaccharides (NIFEREX) 150 MG capsule Take 1 capsule (150 mg total) by mouth daily. 30 capsule 0   lisinopril-hydrochlorothiazide (ZESTORETIC) 10-12.5 MG tablet Take 1 tablet by mouth daily.     metoprolol tartrate (LOPRESSOR) 25 MG tablet Take 0.5 tablets (12.5 mg total) by mouth 2 (two) times daily. Will give 1 month supply- and get from NP/PCP in future. 30 tablet 0   Multiple Vitamins-Minerals (CENTRUM SILVER PO) Take 1 tablet by mouth daily.      oxybutynin (DITROPAN-XL) 10 MG 24 hr tablet Take 10 mg by mouth daily.     Oxycodone HCl 10 MG TABS Take 1 tablet (10 mg total) by mouth 3 (three) times daily as needed. 2 tablet 0   pantoprazole (PROTONIX) 40 MG tablet Take 1 tablet (40 mg total) by mouth daily. 30 tablet 0    polyethylene glycol powder (MIRALAX) 17 GM/SCOOP powder Take 17 g by mouth daily as needed for mild constipation. 255 g 0   senna-docusate (SENOKOT-S) 8.6-50 MG tablet Take 2 tablets by mouth daily after supper. 30 tablet 0   sodium bicarbonate 650 MG tablet Take 1 tablet (650 mg total) by mouth 2 (two) times daily. 60 tablet 0   No current facility-administered medications for this visit.     ROS:  See HPI  Physical Exam:    Incision:       Assessment/Plan:  This is a 72 y.o. male who is s/p:right AKA by DR. Randie Heinz secondary to right lower extremity bypass status subsequently failed and progressive ischemic necrosis of the right lower extremity.  The incision is healing well without erythema or drainage.  He will continue soap and water washing daily.       Clinton Gallant, [facility] [phone]   Clinic MD:  Randie Heinz

## 2023-05-02 NOTE — Telephone Encounter (Signed)
Yes

## 2023-05-03 ENCOUNTER — Other Ambulatory Visit: Payer: Self-pay | Admitting: *Deleted

## 2023-05-09 DIAGNOSIS — Z4781 Encounter for orthopedic aftercare following surgical amputation: Secondary | ICD-10-CM | POA: Diagnosis not present

## 2023-05-09 DIAGNOSIS — Z87891 Personal history of nicotine dependence: Secondary | ICD-10-CM | POA: Diagnosis not present

## 2023-05-09 DIAGNOSIS — Z89611 Acquired absence of right leg above knee: Secondary | ICD-10-CM | POA: Diagnosis not present

## 2023-05-09 DIAGNOSIS — I129 Hypertensive chronic kidney disease with stage 1 through stage 4 chronic kidney disease, or unspecified chronic kidney disease: Secondary | ICD-10-CM | POA: Diagnosis not present

## 2023-05-09 DIAGNOSIS — N183 Chronic kidney disease, stage 3 unspecified: Secondary | ICD-10-CM | POA: Diagnosis not present

## 2023-05-09 DIAGNOSIS — Z87442 Personal history of urinary calculi: Secondary | ICD-10-CM | POA: Diagnosis not present

## 2023-05-09 DIAGNOSIS — G546 Phantom limb syndrome with pain: Secondary | ICD-10-CM | POA: Diagnosis not present

## 2023-05-09 DIAGNOSIS — N1330 Unspecified hydronephrosis: Secondary | ICD-10-CM | POA: Diagnosis not present

## 2023-05-09 DIAGNOSIS — K219 Gastro-esophageal reflux disease without esophagitis: Secondary | ICD-10-CM | POA: Diagnosis not present

## 2023-05-09 DIAGNOSIS — Z7982 Long term (current) use of aspirin: Secondary | ICD-10-CM | POA: Diagnosis not present

## 2023-05-09 DIAGNOSIS — Z9181 History of falling: Secondary | ICD-10-CM | POA: Diagnosis not present

## 2023-05-09 DIAGNOSIS — E785 Hyperlipidemia, unspecified: Secondary | ICD-10-CM | POA: Diagnosis not present

## 2023-05-09 DIAGNOSIS — D62 Acute posthemorrhagic anemia: Secondary | ICD-10-CM | POA: Diagnosis not present

## 2023-05-09 DIAGNOSIS — I739 Peripheral vascular disease, unspecified: Secondary | ICD-10-CM | POA: Diagnosis not present

## 2023-05-10 ENCOUNTER — Other Ambulatory Visit: Payer: Self-pay | Admitting: Vascular Surgery

## 2023-05-10 DIAGNOSIS — Z89611 Acquired absence of right leg above knee: Secondary | ICD-10-CM | POA: Diagnosis not present

## 2023-05-10 DIAGNOSIS — Z87442 Personal history of urinary calculi: Secondary | ICD-10-CM | POA: Diagnosis not present

## 2023-05-10 DIAGNOSIS — Z4781 Encounter for orthopedic aftercare following surgical amputation: Secondary | ICD-10-CM | POA: Diagnosis not present

## 2023-05-10 DIAGNOSIS — N183 Chronic kidney disease, stage 3 unspecified: Secondary | ICD-10-CM | POA: Diagnosis not present

## 2023-05-10 DIAGNOSIS — Z7982 Long term (current) use of aspirin: Secondary | ICD-10-CM | POA: Diagnosis not present

## 2023-05-10 DIAGNOSIS — I129 Hypertensive chronic kidney disease with stage 1 through stage 4 chronic kidney disease, or unspecified chronic kidney disease: Secondary | ICD-10-CM | POA: Diagnosis not present

## 2023-05-10 DIAGNOSIS — Z9181 History of falling: Secondary | ICD-10-CM | POA: Diagnosis not present

## 2023-05-10 DIAGNOSIS — G546 Phantom limb syndrome with pain: Secondary | ICD-10-CM | POA: Diagnosis not present

## 2023-05-10 DIAGNOSIS — E785 Hyperlipidemia, unspecified: Secondary | ICD-10-CM | POA: Diagnosis not present

## 2023-05-10 DIAGNOSIS — K219 Gastro-esophageal reflux disease without esophagitis: Secondary | ICD-10-CM | POA: Diagnosis not present

## 2023-05-10 DIAGNOSIS — I739 Peripheral vascular disease, unspecified: Secondary | ICD-10-CM | POA: Diagnosis not present

## 2023-05-10 DIAGNOSIS — Z87891 Personal history of nicotine dependence: Secondary | ICD-10-CM | POA: Diagnosis not present

## 2023-05-10 DIAGNOSIS — D62 Acute posthemorrhagic anemia: Secondary | ICD-10-CM | POA: Diagnosis not present

## 2023-05-10 DIAGNOSIS — N1330 Unspecified hydronephrosis: Secondary | ICD-10-CM | POA: Diagnosis not present

## 2023-05-14 ENCOUNTER — Other Ambulatory Visit: Payer: Self-pay

## 2023-05-14 DIAGNOSIS — N1330 Unspecified hydronephrosis: Secondary | ICD-10-CM | POA: Diagnosis not present

## 2023-05-14 DIAGNOSIS — I129 Hypertensive chronic kidney disease with stage 1 through stage 4 chronic kidney disease, or unspecified chronic kidney disease: Secondary | ICD-10-CM | POA: Diagnosis not present

## 2023-05-14 DIAGNOSIS — I739 Peripheral vascular disease, unspecified: Secondary | ICD-10-CM | POA: Diagnosis not present

## 2023-05-14 DIAGNOSIS — Z4781 Encounter for orthopedic aftercare following surgical amputation: Secondary | ICD-10-CM | POA: Diagnosis not present

## 2023-05-14 DIAGNOSIS — Z7982 Long term (current) use of aspirin: Secondary | ICD-10-CM | POA: Diagnosis not present

## 2023-05-14 DIAGNOSIS — D62 Acute posthemorrhagic anemia: Secondary | ICD-10-CM | POA: Diagnosis not present

## 2023-05-14 DIAGNOSIS — I70221 Atherosclerosis of native arteries of extremities with rest pain, right leg: Secondary | ICD-10-CM

## 2023-05-14 DIAGNOSIS — G546 Phantom limb syndrome with pain: Secondary | ICD-10-CM | POA: Diagnosis not present

## 2023-05-14 DIAGNOSIS — N183 Chronic kidney disease, stage 3 unspecified: Secondary | ICD-10-CM | POA: Diagnosis not present

## 2023-05-14 DIAGNOSIS — Z9181 History of falling: Secondary | ICD-10-CM | POA: Diagnosis not present

## 2023-05-14 DIAGNOSIS — E785 Hyperlipidemia, unspecified: Secondary | ICD-10-CM | POA: Diagnosis not present

## 2023-05-14 DIAGNOSIS — Z87891 Personal history of nicotine dependence: Secondary | ICD-10-CM | POA: Diagnosis not present

## 2023-05-14 DIAGNOSIS — K219 Gastro-esophageal reflux disease without esophagitis: Secondary | ICD-10-CM | POA: Diagnosis not present

## 2023-05-14 DIAGNOSIS — Z87442 Personal history of urinary calculi: Secondary | ICD-10-CM | POA: Diagnosis not present

## 2023-05-14 DIAGNOSIS — Z89611 Acquired absence of right leg above knee: Secondary | ICD-10-CM | POA: Diagnosis not present

## 2023-05-15 DIAGNOSIS — K219 Gastro-esophageal reflux disease without esophagitis: Secondary | ICD-10-CM | POA: Diagnosis not present

## 2023-05-15 DIAGNOSIS — I739 Peripheral vascular disease, unspecified: Secondary | ICD-10-CM | POA: Diagnosis not present

## 2023-05-15 DIAGNOSIS — Z9181 History of falling: Secondary | ICD-10-CM | POA: Diagnosis not present

## 2023-05-15 DIAGNOSIS — N183 Chronic kidney disease, stage 3 unspecified: Secondary | ICD-10-CM | POA: Diagnosis not present

## 2023-05-15 DIAGNOSIS — Z7982 Long term (current) use of aspirin: Secondary | ICD-10-CM | POA: Diagnosis not present

## 2023-05-15 DIAGNOSIS — Z4781 Encounter for orthopedic aftercare following surgical amputation: Secondary | ICD-10-CM | POA: Diagnosis not present

## 2023-05-15 DIAGNOSIS — E785 Hyperlipidemia, unspecified: Secondary | ICD-10-CM | POA: Diagnosis not present

## 2023-05-15 DIAGNOSIS — I129 Hypertensive chronic kidney disease with stage 1 through stage 4 chronic kidney disease, or unspecified chronic kidney disease: Secondary | ICD-10-CM | POA: Diagnosis not present

## 2023-05-15 DIAGNOSIS — Z87442 Personal history of urinary calculi: Secondary | ICD-10-CM | POA: Diagnosis not present

## 2023-05-15 DIAGNOSIS — Z89611 Acquired absence of right leg above knee: Secondary | ICD-10-CM | POA: Diagnosis not present

## 2023-05-15 DIAGNOSIS — N1330 Unspecified hydronephrosis: Secondary | ICD-10-CM | POA: Diagnosis not present

## 2023-05-15 DIAGNOSIS — D62 Acute posthemorrhagic anemia: Secondary | ICD-10-CM | POA: Diagnosis not present

## 2023-05-15 DIAGNOSIS — G546 Phantom limb syndrome with pain: Secondary | ICD-10-CM | POA: Diagnosis not present

## 2023-05-15 DIAGNOSIS — Z87891 Personal history of nicotine dependence: Secondary | ICD-10-CM | POA: Diagnosis not present

## 2023-05-18 ENCOUNTER — Other Ambulatory Visit: Payer: Self-pay | Admitting: Physical Medicine and Rehabilitation

## 2023-05-21 DIAGNOSIS — S78111A Complete traumatic amputation at level between right hip and knee, initial encounter: Secondary | ICD-10-CM | POA: Diagnosis not present

## 2023-05-21 DIAGNOSIS — N183 Chronic kidney disease, stage 3 unspecified: Secondary | ICD-10-CM | POA: Diagnosis not present

## 2023-05-24 ENCOUNTER — Encounter: Payer: Self-pay | Admitting: Family Medicine

## 2023-05-24 ENCOUNTER — Ambulatory Visit (INDEPENDENT_AMBULATORY_CARE_PROVIDER_SITE_OTHER): Payer: Medicare Other | Admitting: Family Medicine

## 2023-05-24 VITALS — BP 118/64 | HR 83 | Ht 71.0 in | Wt 220.0 lb

## 2023-05-24 DIAGNOSIS — I739 Peripheral vascular disease, unspecified: Secondary | ICD-10-CM | POA: Diagnosis not present

## 2023-05-24 DIAGNOSIS — L089 Local infection of the skin and subcutaneous tissue, unspecified: Secondary | ICD-10-CM | POA: Diagnosis not present

## 2023-05-24 DIAGNOSIS — Z87442 Personal history of urinary calculi: Secondary | ICD-10-CM | POA: Diagnosis not present

## 2023-05-24 DIAGNOSIS — S80822A Blister (nonthermal), left lower leg, initial encounter: Secondary | ICD-10-CM | POA: Diagnosis not present

## 2023-05-24 DIAGNOSIS — Z4781 Encounter for orthopedic aftercare following surgical amputation: Secondary | ICD-10-CM | POA: Diagnosis not present

## 2023-05-24 DIAGNOSIS — D62 Acute posthemorrhagic anemia: Secondary | ICD-10-CM | POA: Diagnosis not present

## 2023-05-24 DIAGNOSIS — Z87891 Personal history of nicotine dependence: Secondary | ICD-10-CM | POA: Diagnosis not present

## 2023-05-24 DIAGNOSIS — N183 Chronic kidney disease, stage 3 unspecified: Secondary | ICD-10-CM | POA: Diagnosis not present

## 2023-05-24 DIAGNOSIS — K219 Gastro-esophageal reflux disease without esophagitis: Secondary | ICD-10-CM | POA: Diagnosis not present

## 2023-05-24 DIAGNOSIS — E785 Hyperlipidemia, unspecified: Secondary | ICD-10-CM | POA: Diagnosis not present

## 2023-05-24 DIAGNOSIS — Z7982 Long term (current) use of aspirin: Secondary | ICD-10-CM | POA: Diagnosis not present

## 2023-05-24 DIAGNOSIS — N1330 Unspecified hydronephrosis: Secondary | ICD-10-CM | POA: Diagnosis not present

## 2023-05-24 DIAGNOSIS — Z9181 History of falling: Secondary | ICD-10-CM | POA: Diagnosis not present

## 2023-05-24 DIAGNOSIS — Z89611 Acquired absence of right leg above knee: Secondary | ICD-10-CM | POA: Diagnosis not present

## 2023-05-24 DIAGNOSIS — G546 Phantom limb syndrome with pain: Secondary | ICD-10-CM | POA: Diagnosis not present

## 2023-05-24 DIAGNOSIS — I129 Hypertensive chronic kidney disease with stage 1 through stage 4 chronic kidney disease, or unspecified chronic kidney disease: Secondary | ICD-10-CM | POA: Diagnosis not present

## 2023-05-24 MED ORDER — DOXYCYCLINE HYCLATE 100 MG PO TABS
100.0000 mg | ORAL_TABLET | Freq: Two times a day (BID) | ORAL | 0 refills | Status: AC
Start: 2023-05-24 — End: 2023-06-03

## 2023-05-24 MED ORDER — MUPIROCIN 2 % EX OINT: 1.0000 | TOPICAL_OINTMENT | Freq: Two times a day (BID) | CUTANEOUS | 3 refills | Status: DC

## 2023-05-24 MED ORDER — LEVOCETIRIZINE DIHYDROCHLORIDE 5 MG PO TABS: 5.0000 mg | ORAL_TABLET | Freq: Every evening | ORAL | 1 refills | Status: DC

## 2023-05-24 MED ORDER — MUPIROCIN 2 % EX OINT
1.0000 | TOPICAL_OINTMENT | Freq: Two times a day (BID) | CUTANEOUS | 3 refills | Status: DC
Start: 2023-05-24 — End: 2023-05-24

## 2023-05-24 NOTE — Assessment & Plan Note (Signed)
Started Doxycyline 100 mg x 7 days Xyzal 5 mg for itch relief  Advise to apply warm compress on affected area, avoid scratching, keep leg clean and dry.

## 2023-05-24 NOTE — Progress Notes (Signed)
Patient Office Visit   Subjective   Patient ID: [name], male    DOB: 02-22-1951  Age: 72 y.o. MRN: [id number]  CC:  Chief Complaint  Patient presents with   Wound Check    Patient complains of a blister on his L ankle that started as a rash 2 months ago, then blistered 2 weeks ago.     HPI [name] 72 year old male, presents to the clinic for Left ankle blister started about 2 months ago and worsening since onset. He  has a past medical history of Family history of metabolic acidosis with increased anion gap (03/05/2023), GERD (gastroesophageal reflux disease), History of kidney stones, Hyperlipidemia, Hypertension, Prostate cancer (HCC) (2011), Umbilical hernia, Wears dentures, Wears glasses, and Wears partial dentures.For the details of today's visit, please refer to assessment and plan.   HPI    Outpatient Encounter Medications as of 05/24/2023  Medication Sig   acetaminophen (TYLENOL) 325 MG tablet Take 1-2 tablets (325-650 mg total) by mouth every 4 (four) hours as needed for mild pain.   aspirin EC 81 MG tablet Take 81 mg by mouth daily.   atorvastatin (LIPITOR) 40 MG tablet Take 1 tablet (40 mg total) by mouth every morning.   cyclobenzaprine (FLEXERIL) 5 MG tablet Take 1 tablet (5 mg total) by mouth 3 (three) times daily as needed for muscle spasms.   doxycycline (VIBRA-TABS) 100 MG tablet Take 1 tablet (100 mg total) by mouth 2 (two) times daily for 10 days.   gabapentin (NEURONTIN) 100 MG capsule Take 1 capsule (100 mg total) by mouth daily.   gabapentin (NEURONTIN) 300 MG capsule Take 1 capsule (300 mg total) by mouth at bedtime.   iron polysaccharides (NIFEREX) 150 MG capsule Take 1 capsule (150 mg total) by mouth daily.   levocetirizine (XYZAL) 5 MG tablet Take 1 tablet (5 mg total) by mouth every evening.   lisinopril-hydrochlorothiazide (ZESTORETIC) 10-12.5 MG tablet Take 1 tablet by mouth daily.   metoprolol tartrate (LOPRESSOR) 25 MG tablet  Take 0.5 tablets (12.5 mg total) by mouth 2 (two) times daily. Will give 1 month supply- and get from NP/PCP in future.   Multiple Vitamins-Minerals (CENTRUM SILVER PO) Take 1 tablet by mouth daily.    oxybutynin (DITROPAN-XL) 10 MG 24 hr tablet Take 10 mg by mouth daily.   Oxycodone HCl 10 MG TABS Take 1 tablet (10 mg total) by mouth 3 (three) times daily as needed.   pantoprazole (PROTONIX) 40 MG tablet Take 1 tablet (40 mg total) by mouth daily.   polyethylene glycol powder (MIRALAX) 17 GM/SCOOP powder Take 17 g by mouth daily as needed for mild constipation.   senna-docusate (SENOKOT-S) 8.6-50 MG tablet Take 2 tablets by mouth daily after supper.   sodium bicarbonate 650 MG tablet Take 1 tablet (650 mg total) by mouth 2 (two) times daily.   [DISCONTINUED] mupirocin ointment (BACTROBAN) 2 % Apply 1 Application topically 2 (two) times daily.   mupirocin ointment (BACTROBAN) 2 % Apply 1 Application topically 2 (two) times daily.   No facility-administered encounter medications on file as of 05/24/2023.    Past Surgical History:  Procedure Laterality Date   ABDOMINAL AORTOGRAM W/LOWER EXTREMITY N/A 11/20/2022   Procedure: ABDOMINAL AORTOGRAM W/LOWER EXTREMITY;  Surgeon: Maeola Harman, MD;  Location: [facility] INVASIVE CV LAB;  Service: Cardiovascular;  Laterality: N/A;   ABDOMINAL AORTOGRAM W/LOWER EXTREMITY N/A 03/05/2023   Procedure: ABDOMINAL AORTOGRAM W/LOWER EXTREMITY;  Surgeon: Maeola Harman, MD;  Location: [facility] INVASIVE CV LAB;  Service: Cardiovascular;  Laterality: N/A;   AMPUTATION Right 03/09/2023   Procedure: AMPUTATION ABOVE KNEE;  Surgeon: Maeola Harman, MD;  Location: [facility] OR;  Service: Vascular;  Laterality: Right;   COLONOSCOPY N/A 08/25/2020   Procedure: COLONOSCOPY;  Surgeon: Corbin Ade, MD;  [facility];  Service: Endoscopy;  Laterality: N/A;  9:30   COLONOSCOPY     CYSTOSCOPY WITH LITHOLAPAXY N/A 05/18/2021   Procedure: CYSTOSCOPY WITH  LITHOLAPAXY;  Surgeon: Rene Paci, MD;  Location: [facility];  Service: Urology;  Laterality: N/A;  ONLY NEEDS  30 MIN   FEMORAL-TIBIAL BYPASS GRAFT Right 12/12/2022   Procedure: RIGHT COMMON FEMORAL-POSTERIOR TIBIAL ARTERY BYPASS WITH VEIN HARVESTING OF THE GREATER SAPHENOUS VEIN AND FEMORAL ENDARTERECTOMY WITH COMPOSITE GRAFT;  Surgeon: Maeola Harman, MD;  Location: [facility] OR;  Service: Vascular;  Laterality: Right;   POLYPECTOMY  08/25/2020   Procedure: POLYPECTOMY;  Surgeon: Corbin Ade, MD;  [facility];  Service: Endoscopy;;   ROBOT ASSISTED LAPAROSCOPIC RADICAL PROSTATECTOMY  2011   TONSILLECTOMY  age 16   and adenoids removed    Review of Systems  Constitutional:  Negative for chills and fever.  Eyes:  Negative for blurred vision.  Respiratory:  Negative for shortness of breath.   Cardiovascular:  Negative for chest pain.  Gastrointestinal:  Negative for abdominal pain.  Genitourinary:  Negative for dysuria.  Musculoskeletal:  Negative for myalgias.  Skin:  Positive for itching and rash.  Neurological:  Negative for dizziness and headaches.      Objective    BP 118/64   Pulse 83   Ht 5\' 11"  (1.803 m)   Wt 220 lb (99.8 kg)   SpO2 94%   BMI 30.68 kg/m   Physical Exam Vitals reviewed.  Constitutional:      General: He is not in acute distress.    Appearance: Normal appearance. He is not ill-appearing, toxic-appearing or diaphoretic.  HENT:     Head: Normocephalic.  Eyes:     General:        Right eye: No discharge.        Left eye: No discharge.     Conjunctiva/sclera: Conjunctivae normal.  Cardiovascular:     Rate and Rhythm: Normal rate.     Pulses: Normal pulses.     Heart sounds: Normal heart sounds.  Pulmonary:     Effort: Pulmonary effort is normal. No respiratory distress.     Breath sounds: Normal breath sounds.  Musculoskeletal:        General: Normal range of motion.     Cervical back: Normal  range of motion.  Skin:    General: Skin is warm and dry.     Capillary Refill: Capillary refill takes less than 2 seconds.     Findings: Rash present. Rash is crusting, pustular and urticarial.     Comments: Leg ankle slight swelling noted, with erythematous flat pustular nodules  Neurological:     General: No focal deficit present.     Mental Status: He is alert and oriented to person, place, and time.     Coordination: Coordination normal.     Gait: Gait normal.  Psychiatric:        Mood and Affect: Mood normal.        Behavior: Behavior normal.       Assessment & Plan:  Skin infection -     Doxycycline Hyclate; Take 1 tablet (100  mg total) by mouth 2 (two) times daily for 10 days.  Dispense: 20 tablet; Refill: 0 -     Mupirocin; Apply 1 Application topically 2 (two) times daily.  Dispense: 30 g; Refill: 3  Blister of left lower extremity, initial encounter Assessment & Plan: Started Doxycyline 100 mg x 7 days Xyzal 5 mg for itch relief  Advise to apply warm compress on affected area, avoid scratching, keep leg clean and dry.    Other orders -     Levocetirizine Dihydrochloride; Take 1 tablet (5 mg total) by mouth every evening.  Dispense: 15 tablet; Refill: 1    Return if symptoms worsen or fail to improve.   Cruzita Lederer Newman Nip, FNP

## 2023-05-24 NOTE — Patient Instructions (Addendum)
        Great to see you today.   - Please take medications as prescribed. - Follow up with your primary health provider if any health concerns arises. - If symptoms worsen please contact your primary care provider and/or visit the emergency department.  

## 2023-05-28 ENCOUNTER — Other Ambulatory Visit: Payer: Self-pay | Admitting: Vascular Surgery

## 2023-06-08 ENCOUNTER — Telehealth: Payer: Self-pay

## 2023-06-08 NOTE — Telephone Encounter (Signed)
Called pt to let him know Lipitor refills will need to go through his PCP. He verbalized understanding of this. No questions/concerns at this time.

## 2023-06-12 ENCOUNTER — Telehealth: Payer: Self-pay | Admitting: Physical Medicine and Rehabilitation

## 2023-06-12 DIAGNOSIS — Z89611 Acquired absence of right leg above knee: Secondary | ICD-10-CM

## 2023-06-12 NOTE — Telephone Encounter (Addendum)
Patient called in requesting a home health referral for PT if possible , patient will be getting his prosthetic leg 7/10 and was told by [facility] that he will need PT .

## 2023-06-13 NOTE — Telephone Encounter (Signed)
I spoke with [name] and he is not specifically homebound so it may not be possible to get in home PT. He lives in [place] and would need a PT in that area.

## 2023-06-18 NOTE — Telephone Encounter (Signed)
Patient notified

## 2023-06-18 NOTE — Addendum Note (Signed)
Addended by: Genice Rouge on: 06/18/2023 10:49 AM   Modules accepted: Orders

## 2023-06-20 ENCOUNTER — Telehealth: Payer: Self-pay

## 2023-06-20 NOTE — Telephone Encounter (Signed)
Per [name] from [name], patient's office visit notes Must Say the patient's stump is healed due to Medicare guidelines.  Otherwise, the patient cannot get a new Prosthetic device.  Patient is aware and verbalized understanding.  We will call the pt either today or tomorrow to schedule a Wound check appt with one of our PAs.

## 2023-06-21 ENCOUNTER — Ambulatory Visit (INDEPENDENT_AMBULATORY_CARE_PROVIDER_SITE_OTHER): Payer: Medicare Other | Admitting: Physician Assistant

## 2023-06-21 VITALS — BP 116/69 | HR 79 | Temp 98.7°F | Resp 18 | Ht 71.0 in | Wt 220.0 lb

## 2023-06-21 DIAGNOSIS — Z89611 Acquired absence of right leg above knee: Secondary | ICD-10-CM

## 2023-06-21 DIAGNOSIS — S78111A Complete traumatic amputation at level between right hip and knee, initial encounter: Secondary | ICD-10-CM | POA: Diagnosis not present

## 2023-06-21 DIAGNOSIS — N183 Chronic kidney disease, stage 3 unspecified: Secondary | ICD-10-CM | POA: Diagnosis not present

## 2023-06-21 NOTE — Progress Notes (Signed)
Office Note     CC:  follow up Requesting Provider:  Wylene Men*  HPI: [name] is a 72 y.o. (September 16, 1951) male who presents for evaluation of right above-the-knee amputation.  This was performed by Dr. Randie Heinz on 03/09/2023.  AKA was slow to heal due to an area of scabbing in the mid incision.  Patient states the scab fell off recently and this area has healed.  He is scheduled to see [facility] tomorrow for evaluation for prosthetic limb.  He denies any claudication, rest pain, tissue loss of the left lower extremity.  He is a former smoker.  He is on aspirin and statin daily.   Past Medical History:  Diagnosis Date   Family history of metabolic acidosis with increased anion gap 03/05/2023   GERD (gastroesophageal reflux disease)    History of kidney stones    Hyperlipidemia    Hypertension    Prostate cancer [facility]) 2011   radiation june 2020   Umbilical hernia    Wears dentures    full   Wears glasses    Wears partial dentures    lower    Past Surgical History:  Procedure Laterality Date   ABDOMINAL AORTOGRAM W/LOWER EXTREMITY N/A 11/20/2022   Procedure: ABDOMINAL AORTOGRAM W/LOWER EXTREMITY;  Surgeon: Maeola Harman, MD;  Location: [facility] INVASIVE CV LAB;  Service: Cardiovascular;  Laterality: N/A;   ABDOMINAL AORTOGRAM W/LOWER EXTREMITY N/A 03/05/2023   Procedure: ABDOMINAL AORTOGRAM W/LOWER EXTREMITY;  Surgeon: Maeola Harman, MD;  Location: [facility] INVASIVE CV LAB;  Service: Cardiovascular;  Laterality: N/A;   AMPUTATION Right 03/09/2023   Procedure: AMPUTATION ABOVE KNEE;  Surgeon: Maeola Harman, MD;  Location: [facility] OR;  Service: Vascular;  Laterality: Right;   COLONOSCOPY N/A 08/25/2020   Procedure: COLONOSCOPY;  Surgeon: Corbin Ade, MD;  Location: [facility];  Service: Endoscopy;  Laterality: N/A;  9:30   COLONOSCOPY     CYSTOSCOPY WITH LITHOLAPAXY N/A 05/18/2021   Procedure: CYSTOSCOPY WITH LITHOLAPAXY;  Surgeon: Rene Paci, MD;  Location: [facility];  Service: Urology;  Laterality: N/A;  ONLY NEEDS  30 MIN   FEMORAL-TIBIAL BYPASS GRAFT Right 12/12/2022   Procedure: RIGHT COMMON FEMORAL-POSTERIOR TIBIAL ARTERY BYPASS WITH VEIN HARVESTING OF THE GREATER SAPHENOUS VEIN AND FEMORAL ENDARTERECTOMY WITH COMPOSITE GRAFT;  Surgeon: Maeola Harman, MD;  Location: [facility] OR;  Service: Vascular;  Laterality: Right;   POLYPECTOMY  08/25/2020   Procedure: POLYPECTOMY;  Surgeon: Corbin Ade, MD;  Location: [facility];  Service: Endoscopy;;   ROBOT ASSISTED LAPAROSCOPIC RADICAL PROSTATECTOMY  2011   TONSILLECTOMY  age 47   and adenoids removed    Social History   Socioeconomic History   Marital status: Single    Spouse name: Not on file   Number of children: 1   Years of education: Not on file   Highest education level: Not on file  Occupational History    Comment: customer service   Tobacco Use   Smoking status: Former    Packs/day: 0.25    Years: 21.00    Additional pack years: 0.00    Total pack years: 5.25    Types: Cigarettes    Quit date: 11/29/2022    Years since quitting: 0.5    Passive exposure: Never   Smokeless tobacco: Never  Vaping Use   Vaping Use: Never used  Substance and Sexual Activity   Alcohol use: Never   Drug use: Never   Sexual  activity: Not Currently  Other Topics Concern   Not on file  Social History Narrative   Has one son.    Social Determinants of Health   Financial Resource Strain: Not on file  Food Insecurity: No Food Insecurity (12/12/2022)   Hunger Vital Sign    Worried About Running Out of Food in the Last Year: Never true    Ran Out of Food in the Last Year: Never true  Transportation Needs: No Transportation Needs (12/12/2022)   PRAPARE - Administrator, Civil Service (Medical): No    Lack of Transportation (Non-Medical): No  Physical Activity: Not on file  Stress: Not on file  Social Connections: Not on  file  Intimate Partner Violence: Not At Risk (12/12/2022)   Humiliation, Afraid, Rape, and Kick questionnaire    Fear of Current or Ex-Partner: No    Emotionally Abused: No    Physically Abused: No    Sexually Abused: No    Family History  Problem Relation Age of Onset   Gastric cancer Mother    Prostate cancer Father    Breast cancer Neg Hx    Pancreatic cancer Neg Hx     Current Outpatient Medications  Medication Sig Dispense Refill   acetaminophen (TYLENOL) 325 MG tablet Take 1-2 tablets (325-650 mg total) by mouth every 4 (four) hours as needed for mild pain.     aspirin EC 81 MG tablet Take 81 mg by mouth daily.     atorvastatin (LIPITOR) 40 MG tablet Take 1 tablet (40 mg total) by mouth every morning. 30 tablet 0   cyclobenzaprine (FLEXERIL) 5 MG tablet Take 1 tablet (5 mg total) by mouth 3 (three) times daily as needed for muscle spasms. 90 tablet 5   gabapentin (NEURONTIN) 100 MG capsule Take 1 capsule (100 mg total) by mouth daily. 30 capsule 5   gabapentin (NEURONTIN) 300 MG capsule Take 1 capsule (300 mg total) by mouth at bedtime. 30 capsule 5   iron polysaccharides (NIFEREX) 150 MG capsule Take 1 capsule (150 mg total) by mouth daily. 30 capsule 0   levocetirizine (XYZAL) 5 MG tablet Take 1 tablet (5 mg total) by mouth every evening. 15 tablet 1   lisinopril-hydrochlorothiazide (ZESTORETIC) 10-12.5 MG tablet Take 1 tablet by mouth daily.     metoprolol tartrate (LOPRESSOR) 25 MG tablet Take 0.5 tablets (12.5 mg total) by mouth 2 (two) times daily. Will give 1 month supply- and get from NP/PCP in future. 30 tablet 0   Multiple Vitamins-Minerals (CENTRUM SILVER PO) Take 1 tablet by mouth daily.      mupirocin ointment (BACTROBAN) 2 % Apply 1 Application topically 2 (two) times daily. 30 g 3   oxybutynin (DITROPAN-XL) 10 MG 24 hr tablet Take 10 mg by mouth daily.     Oxycodone HCl 10 MG TABS Take 1 tablet (10 mg total) by mouth 3 (three) times daily as needed. 2 tablet 0    pantoprazole (PROTONIX) 40 MG tablet Take 1 tablet (40 mg total) by mouth daily. 30 tablet 0   polyethylene glycol powder (MIRALAX) 17 GM/SCOOP powder Take 17 g by mouth daily as needed for mild constipation. 255 g 0   senna-docusate (SENOKOT-S) 8.6-50 MG tablet Take 2 tablets by mouth daily after supper. 30 tablet 0   sodium bicarbonate 650 MG tablet Take 1 tablet (650 mg total) by mouth 2 (two) times daily. 60 tablet 0   No current facility-administered medications for this visit.    Allergies  Allergen Reactions   Shellfish Allergy Swelling     REVIEW OF SYSTEMS:   [X]  denotes positive finding, [ ]  denotes negative finding Cardiac  Comments:  Chest pain or chest pressure:    Shortness of breath upon exertion:    Short of breath when lying flat:    Irregular heart rhythm:        Vascular    Pain in calf, thigh, or hip brought on by ambulation:    Pain in feet at night that wakes you up from your sleep:     Blood clot in your veins:    Leg swelling:         Pulmonary    Oxygen at home:    Productive cough:     Wheezing:         Neurologic    Sudden weakness in arms or legs:     Sudden numbness in arms or legs:     Sudden onset of difficulty speaking or slurred speech:    Temporary loss of vision in one eye:     Problems with dizziness:         Gastrointestinal    Blood in stool:     Vomited blood:         Genitourinary    Burning when urinating:     Blood in urine:        Psychiatric    Major depression:         Hematologic    Bleeding problems:    Problems with blood clotting too easily:        Skin    Rashes or ulcers:        Constitutional    Fever or chills:      PHYSICAL EXAMINATION:  Vitals:   06/21/23 1034  BP: 116/69  Pulse: 79  Resp: 18  Temp: 98.7 F (37.1 C)  TempSrc: Temporal  SpO2: 91%  Weight: 220 lb (99.8 kg)  Height: 5\' 11"  (1.803 m)    General:  WDWN in NAD; vital signs documented above Gait: Not observed HENT: WNL,  normocephalic Pulmonary: normal non-labored breathing , without Rales, rhonchi,  wheezing Cardiac: regular HR Abdomen: soft, NT, no masses Skin: without rashes Vascular Exam/Pulses: L foot warm and well perfused Extremities: Right AKA well-healed; dimpling mid incision however no apparent wound; left foot without wounds Musculoskeletal: no muscle wasting or atrophy  Neurologic: A&O X 3 Psychiatric:  The pt has Normal affect.   Non-Invasive Vascular Imaging:   None    ASSESSMENT/PLAN:: 72 y.o. male here for follow up for slow to heal right AKA  -Right AKA now completely healed.  There is an area of dimpling mid incision however no apparent wound at this area.  He is without claudication, rest pain, or tissue loss of the left lower extremity.  He is scheduled for an ABI in November of this year.  In my opinion he is ready for a prosthetic limb on the right leg.  The patient has a right Above Knee Amputation. The patient is well motivated to return to their prior functional status by utilizing a prosthesis to perform ADL's and maintain a healthy lifestyle. The patient has the physical and cognitive capacity to function with a prosthesis.   Functional Level: K2 Limited Community Ambulator: Has the ability or potential for ambulation and to traverse low environmental barriers such as curbs, stairs or uneven surfaces  Residual Limb History: The skin condition of the residual limb is healthy. The  patient will continue to monitor the skin of the residual limb and follow hygiene instructions.  The patient is experiencing no pain related to amputation  Prosthetic Prescription Plan: Counseling and education regarding prosthetic management will be provided to the patient via a certified prosthetist. A multi-discipline team, including physical therapy, will manage the prosthetic fabrication, fitting and prosthetic gait training.     Emilie Rutter, PA-C [facility] [phone]  Clinic MD:   Edilia Bo

## 2023-06-26 DIAGNOSIS — E782 Mixed hyperlipidemia: Secondary | ICD-10-CM | POA: Diagnosis not present

## 2023-06-26 DIAGNOSIS — D649 Anemia, unspecified: Secondary | ICD-10-CM | POA: Diagnosis not present

## 2023-06-26 DIAGNOSIS — Z899 Acquired absence of limb, unspecified: Secondary | ICD-10-CM | POA: Diagnosis not present

## 2023-06-26 DIAGNOSIS — I739 Peripheral vascular disease, unspecified: Secondary | ICD-10-CM | POA: Diagnosis not present

## 2023-06-26 DIAGNOSIS — I2510 Atherosclerotic heart disease of native coronary artery without angina pectoris: Secondary | ICD-10-CM | POA: Diagnosis not present

## 2023-06-26 DIAGNOSIS — I10 Essential (primary) hypertension: Secondary | ICD-10-CM | POA: Diagnosis not present

## 2023-06-26 DIAGNOSIS — N1831 Chronic kidney disease, stage 3a: Secondary | ICD-10-CM | POA: Diagnosis not present

## 2023-06-26 DIAGNOSIS — G546 Phantom limb syndrome with pain: Secondary | ICD-10-CM | POA: Diagnosis not present

## 2023-06-26 DIAGNOSIS — S78111D Complete traumatic amputation at level between right hip and knee, subsequent encounter: Secondary | ICD-10-CM | POA: Diagnosis not present

## 2023-07-05 DIAGNOSIS — N1831 Chronic kidney disease, stage 3a: Secondary | ICD-10-CM | POA: Diagnosis not present

## 2023-07-05 DIAGNOSIS — N179 Acute kidney failure, unspecified: Secondary | ICD-10-CM | POA: Diagnosis not present

## 2023-07-05 DIAGNOSIS — E1122 Type 2 diabetes mellitus with diabetic chronic kidney disease: Secondary | ICD-10-CM | POA: Diagnosis not present

## 2023-07-05 DIAGNOSIS — N189 Chronic kidney disease, unspecified: Secondary | ICD-10-CM | POA: Diagnosis not present

## 2023-07-05 DIAGNOSIS — I129 Hypertensive chronic kidney disease with stage 1 through stage 4 chronic kidney disease, or unspecified chronic kidney disease: Secondary | ICD-10-CM | POA: Diagnosis not present

## 2023-07-05 DIAGNOSIS — Z89611 Acquired absence of right leg above knee: Secondary | ICD-10-CM | POA: Diagnosis not present

## 2023-07-05 DIAGNOSIS — D631 Anemia in chronic kidney disease: Secondary | ICD-10-CM | POA: Diagnosis not present

## 2023-07-05 DIAGNOSIS — Z87442 Personal history of urinary calculi: Secondary | ICD-10-CM | POA: Diagnosis not present

## 2023-07-06 ENCOUNTER — Other Ambulatory Visit (HOSPITAL_COMMUNITY): Payer: Self-pay | Admitting: Nephrology

## 2023-07-06 DIAGNOSIS — Z89611 Acquired absence of right leg above knee: Secondary | ICD-10-CM

## 2023-07-06 DIAGNOSIS — N179 Acute kidney failure, unspecified: Secondary | ICD-10-CM

## 2023-07-06 DIAGNOSIS — N200 Calculus of kidney: Secondary | ICD-10-CM

## 2023-07-06 DIAGNOSIS — N1831 Chronic kidney disease, stage 3a: Secondary | ICD-10-CM

## 2023-07-10 ENCOUNTER — Ambulatory Visit (INDEPENDENT_AMBULATORY_CARE_PROVIDER_SITE_OTHER): Payer: Medicare Other | Admitting: Family Medicine

## 2023-07-10 ENCOUNTER — Encounter: Payer: Self-pay | Admitting: Family Medicine

## 2023-07-10 VITALS — BP 102/60 | HR 90 | Ht 72.0 in | Wt 224.0 lb

## 2023-07-10 DIAGNOSIS — I10 Essential (primary) hypertension: Secondary | ICD-10-CM

## 2023-07-10 DIAGNOSIS — Z13220 Encounter for screening for lipoid disorders: Secondary | ICD-10-CM

## 2023-07-10 DIAGNOSIS — R7303 Prediabetes: Secondary | ICD-10-CM | POA: Diagnosis not present

## 2023-07-10 DIAGNOSIS — N1831 Chronic kidney disease, stage 3a: Secondary | ICD-10-CM

## 2023-07-10 NOTE — Assessment & Plan Note (Signed)
Vitals:   07/10/23 0908 07/10/23 0957  BP: (!) 92/52 102/60   Blood pressure controlled, Labs ordered today. Continue Lisinopril-hydrochlorothiazide 12.5 mg Continued discussion on DASH diet, low sodium diet and maintain a exercise routine for 150 minutes per week.

## 2023-07-10 NOTE — Patient Instructions (Signed)

## 2023-07-10 NOTE — Assessment & Plan Note (Signed)
BMP+eGFR ordered in today's visit- Patient establish patient with nephrology.

## 2023-07-10 NOTE — Progress Notes (Signed)
Patient Office Visit   Subjective   Patient ID: [name], male    DOB: 08-20-1951  Age: 72 y.o. MRN: [id number]  CC:  Chief Complaint  Patient presents with   Rash    Patient is here for f/u for rash on lower L leg. States it has gotten better with the scream.     HPI [name] 72 year old male, presents to the clinic for chronic follow up. He  has a past medical history of Family history of metabolic acidosis with increased anion gap (03/05/2023), GERD (gastroesophageal reflux disease), History of kidney stones, Hyperlipidemia, Hypertension, Prostate cancer (HCC) (2011), Umbilical hernia, Wears dentures, Wears glasses, and Wears partial dentures.  Hypertension This is a chronic problem. The problem has been gradually improving since onset. The problem is controlled. Pertinent negatives include no chest pain, headaches or shortness of breath. Risk factors for coronary artery disease include obesity, sedentary lifestyle and male gender. Past treatments include beta blockers, angiotensin blockers and diuretics. The current treatment provides significant improvement. Compliance problems include exercise.       Outpatient Encounter Medications as of 07/10/2023  Medication Sig   acetaminophen (TYLENOL) 325 MG tablet Take 1-2 tablets (325-650 mg total) by mouth every 4 (four) hours as needed for mild pain.   aspirin EC 81 MG tablet Take 81 mg by mouth daily.   atorvastatin (LIPITOR) 40 MG tablet Take 1 tablet (40 mg total) by mouth every morning.   cyclobenzaprine (FLEXERIL) 5 MG tablet Take 1 tablet (5 mg total) by mouth 3 (three) times daily as needed for muscle spasms.   gabapentin (NEURONTIN) 100 MG capsule Take 1 capsule (100 mg total) by mouth daily.   gabapentin (NEURONTIN) 300 MG capsule Take 1 capsule (300 mg total) by mouth at bedtime.   iron polysaccharides (NIFEREX) 150 MG capsule Take 1 capsule (150 mg total) by mouth daily.   levocetirizine (XYZAL) 5 MG  tablet Take 1 tablet (5 mg total) by mouth every evening.   lisinopril-hydrochlorothiazide (ZESTORETIC) 10-12.5 MG tablet Take 1 tablet by mouth daily.   metoprolol tartrate (LOPRESSOR) 25 MG tablet Take 0.5 tablets (12.5 mg total) by mouth 2 (two) times daily. Will give 1 month supply- and get from NP/PCP in future.   Multiple Vitamins-Minerals (CENTRUM SILVER PO) Take 1 tablet by mouth daily.    mupirocin ointment (BACTROBAN) 2 % Apply 1 Application topically 2 (two) times daily.   oxybutynin (DITROPAN-XL) 10 MG 24 hr tablet Take 10 mg by mouth daily.   Oxycodone HCl 10 MG TABS Take 1 tablet (10 mg total) by mouth 3 (three) times daily as needed.   pantoprazole (PROTONIX) 40 MG tablet Take 1 tablet (40 mg total) by mouth daily.   polyethylene glycol powder (MIRALAX) 17 GM/SCOOP powder Take 17 g by mouth daily as needed for mild constipation.   senna-docusate (SENOKOT-S) 8.6-50 MG tablet Take 2 tablets by mouth daily after supper.   sodium bicarbonate 650 MG tablet Take 1 tablet (650 mg total) by mouth 2 (two) times daily.   No facility-administered encounter medications on file as of 07/10/2023.    Past Surgical History:  Procedure Laterality Date   ABDOMINAL AORTOGRAM W/LOWER EXTREMITY N/A 11/20/2022   Procedure: ABDOMINAL AORTOGRAM W/LOWER EXTREMITY;  Surgeon: Maeola Harman, MD;  Location: [facility] INVASIVE CV LAB;  Service: Cardiovascular;  Laterality: N/A;   ABDOMINAL AORTOGRAM W/LOWER EXTREMITY N/A 03/05/2023   Procedure: ABDOMINAL AORTOGRAM W/LOWER EXTREMITY;  Surgeon: Maeola Harman, MD;  Location: [facility] INVASIVE CV LAB;  Service: Cardiovascular;  Laterality: N/A;   AMPUTATION Right 03/09/2023   Procedure: AMPUTATION ABOVE KNEE;  Surgeon: Maeola Harman, MD;  Location: [facility] OR;  Service: Vascular;  Laterality: Right;   COLONOSCOPY N/A 08/25/2020   Procedure: COLONOSCOPY;  Surgeon: Corbin Ade, MD;  Location: [facility];  Service: Endoscopy;  Laterality: N/A;   9:30   COLONOSCOPY     CYSTOSCOPY WITH LITHOLAPAXY N/A 05/18/2021   Procedure: CYSTOSCOPY WITH LITHOLAPAXY;  Surgeon: Rene Paci, MD;  Location: [facility];  Service: Urology;  Laterality: N/A;  ONLY NEEDS  30 MIN   FEMORAL-TIBIAL BYPASS GRAFT Right 12/12/2022   Procedure: RIGHT COMMON FEMORAL-POSTERIOR TIBIAL ARTERY BYPASS WITH VEIN HARVESTING OF THE GREATER SAPHENOUS VEIN AND FEMORAL ENDARTERECTOMY WITH COMPOSITE GRAFT;  Surgeon: Maeola Harman, MD;  Location: [facility] OR;  Service: Vascular;  Laterality: Right;   POLYPECTOMY  08/25/2020   Procedure: POLYPECTOMY;  Surgeon: Corbin Ade, MD;  Location: [facility];  Service: Endoscopy;;   ROBOT ASSISTED LAPAROSCOPIC RADICAL PROSTATECTOMY  2011   TONSILLECTOMY  age 53   and adenoids removed    Review of Systems  Constitutional:  Negative for chills and fever.  Respiratory:  Negative for shortness of breath.   Cardiovascular:  Negative for chest pain.  Genitourinary:  Negative for dysuria.  Musculoskeletal:  Negative for myalgias.  Neurological:  Negative for dizziness and headaches.      Objective    BP 102/60   Pulse 90   Ht 6' (1.829 m)   Wt 224 lb (101.6 kg)   SpO2 91%   BMI 30.38 kg/m   Physical Exam Vitals reviewed.  Constitutional:      General: He is not in acute distress.    Appearance: Normal appearance. He is not ill-appearing, toxic-appearing or diaphoretic.  HENT:     Head: Normocephalic.  Eyes:     General:        Right eye: No discharge.        Left eye: No discharge.     Conjunctiva/sclera: Conjunctivae normal.  Cardiovascular:     Rate and Rhythm: Normal rate.     Pulses: Normal pulses.     Heart sounds: Normal heart sounds.  Pulmonary:     Effort: Pulmonary effort is normal. No respiratory distress.     Breath sounds: Normal breath sounds.  Abdominal:     General: Bowel sounds are normal.     Palpations: Abdomen is soft.     Tenderness: There is no abdominal  tenderness. There is no guarding.  Musculoskeletal:     Cervical back: Normal range of motion.     Comments: AMPUTATION ABOVE KNEE (Right: Knee) Patient on wheelchair    Skin:    General: Skin is warm and dry.     Capillary Refill: Capillary refill takes less than 2 seconds.  Neurological:     General: No focal deficit present.     Mental Status: He is alert and oriented to person, place, and time.     Coordination: Coordination abnormal.     Gait: Gait abnormal.  Psychiatric:        Mood and Affect: Mood normal.        Behavior: Behavior normal.       Assessment & Plan:  Prediabetes -     Hemoglobin A1c  Stage 3a chronic kidney disease (HCC) Assessment & Plan: BMP+eGFR ordered in today's visit- Patient establish patient with nephrology.  Orders: -     BASIC METABOLIC PANEL WITH GFR -     [id number]  Screening for lipid disorders -     Lipid panel  Primary hypertension Assessment & Plan: Vitals:   07/10/23 0908 07/10/23 0957  BP: (!) 92/52 102/60   Blood pressure controlled, Labs ordered today. Continue Lisinopril-hydrochlorothiazide 12.5 mg Continued discussion on DASH diet, low sodium diet and maintain a exercise routine for 150 minutes per week.        Return in about 3 months (around 10/10/2023), or if symptoms worsen or fail to improve, for chronic follow-up, hypertension, CKD, routine labs.   Cruzita Lederer Newman Nip, FNP

## 2023-07-11 LAB — BMP8+EGFR
BUN/Creatinine Ratio: 24 (ref 10–24)
BUN: 37 mg/dL — ABNORMAL HIGH (ref 8–27)
CO2: 23 mmol/L (ref 20–29)
Calcium: 9.8 mg/dL (ref 8.6–10.2)
Chloride: 102 mmol/L (ref 96–106)
Creatinine, Ser: 1.55 mg/dL — ABNORMAL HIGH (ref 0.76–1.27)
Glucose: 107 mg/dL — ABNORMAL HIGH (ref 70–99)
Potassium: 4.7 mmol/L (ref 3.5–5.2)
Sodium: 141 mmol/L (ref 134–144)
eGFR: 47 mL/min/{1.73_m2} — ABNORMAL LOW (ref >59)

## 2023-07-11 LAB — LIPID PANEL
Chol/HDL Ratio: 3.4 ratio (ref 0.0–5.0)
Cholesterol, Total: 116 mg/dL (ref 100–199)
HDL: 34 mg/dL — ABNORMAL LOW (ref >39)
LDL Chol Calc (NIH): 60 mg/dL (ref 0–99)
Triglycerides: 119 mg/dL (ref 0–149)
VLDL Cholesterol Cal: 22 mg/dL (ref 5–40)

## 2023-07-11 LAB — HEMOGLOBIN A1C
Est. average glucose Bld gHb Est-mCnc: 103 mg/dL
Hgb A1c MFr Bld: 5.2 % (ref 4.8–5.6)

## 2023-07-12 ENCOUNTER — Ambulatory Visit (HOSPITAL_COMMUNITY)
Admission: RE | Admit: 2023-07-12 | Discharge: 2023-07-12 | Disposition: A | Discharge status: Home / Self Care | Payer: Medicare Other | Source: Ambulatory Visit | Attending: Nephrology | Admitting: Nephrology

## 2023-07-12 DIAGNOSIS — N179 Acute kidney failure, unspecified: Secondary | ICD-10-CM | POA: Insufficient documentation

## 2023-07-12 DIAGNOSIS — N200 Calculus of kidney: Secondary | ICD-10-CM | POA: Insufficient documentation

## 2023-07-12 DIAGNOSIS — Z89611 Acquired absence of right leg above knee: Secondary | ICD-10-CM

## 2023-07-12 DIAGNOSIS — N281 Cyst of kidney, acquired: Secondary | ICD-10-CM | POA: Diagnosis not present

## 2023-07-12 DIAGNOSIS — N1831 Chronic kidney disease, stage 3a: Secondary | ICD-10-CM | POA: Insufficient documentation

## 2023-07-13 ENCOUNTER — Other Ambulatory Visit: Payer: Self-pay

## 2023-07-13 DIAGNOSIS — Z89611 Acquired absence of right leg above knee: Secondary | ICD-10-CM | POA: Diagnosis not present

## 2023-07-16 ENCOUNTER — Telehealth: Payer: Self-pay | Admitting: Pharmacy Technician

## 2023-07-16 NOTE — Telephone Encounter (Signed)
Auth Submission: NO AUTH NEEDED Site of care: Site of care: [facility] Payer: [employer] MEDICARE Medication & CPT/J Code(s) submitted: Feraheme (ferumoxytol) [id number] Route of submission (phone, fax, portal):  Phone # Fax # Auth type: Buy/Bill Units/visits requested: X2 Reference number: [id number] Approval from: 07/16/23 to 11/16/23

## 2023-07-17 NOTE — Therapy (Deleted)
OUTPATIENT PHYSICAL THERAPY LOWER EXTREMITY EVALUATION   Patient Name: [name] MRN: [id number] DOB:06/13/1951, 72 y.o., male Today's Date: 07/17/2023  END OF SESSION:   Past Medical History:  Diagnosis Date   Family history of metabolic acidosis with increased anion gap 03/05/2023   GERD (gastroesophageal reflux disease)    History of kidney stones    Hyperlipidemia    Hypertension    Prostate cancer [facility]) 2011   radiation june 2020   Umbilical hernia    Wears dentures    full   Wears glasses    Wears partial dentures    lower   Past Surgical History:  Procedure Laterality Date   ABDOMINAL AORTOGRAM W/LOWER EXTREMITY N/A 11/20/2022   Procedure: ABDOMINAL AORTOGRAM W/LOWER EXTREMITY;  Surgeon: Maeola Harman, MD;  Location: [facility] INVASIVE CV LAB;  Service: Cardiovascular;  Laterality: N/A;   ABDOMINAL AORTOGRAM W/LOWER EXTREMITY N/A 03/05/2023   Procedure: ABDOMINAL AORTOGRAM W/LOWER EXTREMITY;  Surgeon: Maeola Harman, MD;  Location: [facility] INVASIVE CV LAB;  Service: Cardiovascular;  Laterality: N/A;   AMPUTATION Right 03/09/2023   Procedure: AMPUTATION ABOVE KNEE;  Surgeon: Maeola Harman, MD;  Location: [facility] OR;  Service: Vascular;  Laterality: Right;   COLONOSCOPY N/A 08/25/2020   Procedure: COLONOSCOPY;  Surgeon: Corbin Ade, MD;  [facility];  Service: Endoscopy;  Laterality: N/A;  9:30   COLONOSCOPY     CYSTOSCOPY WITH LITHOLAPAXY N/A 05/18/2021   Procedure: CYSTOSCOPY WITH LITHOLAPAXY;  Surgeon: Rene Paci, MD;  Location: [facility];  Service: Urology;  Laterality: N/A;  ONLY NEEDS  30 MIN   FEMORAL-TIBIAL BYPASS GRAFT Right 12/12/2022   Procedure: RIGHT COMMON FEMORAL-POSTERIOR TIBIAL ARTERY BYPASS WITH VEIN HARVESTING OF THE GREATER SAPHENOUS VEIN AND FEMORAL ENDARTERECTOMY WITH COMPOSITE GRAFT;  Surgeon: Maeola Harman, MD;  Location: [facility] OR;  Service: Vascular;  Laterality: Right;    POLYPECTOMY  08/25/2020   Procedure: POLYPECTOMY;  Surgeon: Corbin Ade, MD;  [facility];  Service: Endoscopy;;   ROBOT ASSISTED LAPAROSCOPIC RADICAL PROSTATECTOMY  2011   TONSILLECTOMY  age 73   and adenoids removed   Patient Active Problem List   Diagnosis Date Noted   Hypertension 07/10/2023   Blister of left leg 05/24/2023   Acute blood loss anemia 03/27/2023   Phantom pain after amputation of lower extremity (HCC) 03/27/2023   Adjustment disorder with mixed anxiety and depressed mood 03/15/2023   Above knee amputation of right lower extremity (HCC) 03/13/2023   CKD (chronic kidney disease) stage 3, GFR 30-59 ml/min (HCC) 03/13/2023   Cellulitis of right lower extremity 03/09/2023   Fever 03/09/2023   Non-traumatic rhabdomyolysis 03/09/2023   Bandemia 03/09/2023   Acute kidney injury superimposed on chronic kidney disease (HCC) 03/05/2023   Hyperkalemia 03/05/2023   Normocytic anemia 03/05/2023   History of prostate cancer 03/05/2023   Essential hypertension 03/05/2023   Metabolic acidosis, increased anion gap 03/05/2023   Critical limb ischemia of right lower extremity (HCC) 12/12/2022   Malignant neoplasm of prostate (HCC) 03/18/2019    PCP: Eliberto Ivory  REFERRING PROVIDER: Genice Rouge, MD  REFERRING DIAG: [phone] (ICD-10-CM) - S/P AKA (above knee amputation), right (HCC)  THERAPY DIAG:  Muscle weakness Difficulty in walking   Rationale for Evaluation and Treatment: Rehabilitation  ONSET DATE: 03/05/23    SUBJECTIVE STATEMENT: ***  PERTINENT HISTORY:   [name] is a 72 y.o. male with history of HTN, prostate cancer, GERD, CKD, PAD with right  ankle ulcer s/p fem-tib bypass for critical limb ischemia who had failure of graft with postop edema, significant pain and extensive wounds.  He was admitted on 03/11 for attempted angiography and recommendations of right Rt  AKA.  He was admitted into IP rehab following his amputation   PAIN:  Are you having pain? {OPRCPAIN:27236}  PRECAUTIONS: Fall  WEIGHT BEARING RESTRICTIONS: No  FALLS:  Has patient fallen in last 6 months? No  LIVING ENVIRONMENT: Lives with: lives with their family Lives in: House/apartment Stairs: Yes: {Stairs:24000} Has following equipment at home: Environmental consultant - 2 wheeled and Wheelchair (manual)  OCCUPATION: retired   PLOF: {PLOF:24004}  PATIENT GOALS: To be able to walk around the home   NEXT MD VISIT: ***  OBJECTIVE:   COGNITION: Overall cognitive status: Within functional limits for tasks assessed      LOWER EXTREMITY ROM:  Active ROM Right eval Left eval  Hip flexion    Hip extension    Hip abduction    Hip adduction    Hip internal rotation    Hip external rotation    Knee flexion    Knee extension    Ankle dorsiflexion    Ankle plantarflexion    Ankle inversion    Ankle eversion     (Blank rows = not tested)  LOWER EXTREMITY MMT:  MMT Right eval Left eval  Hip flexion    Hip extension    Hip abduction    Hip adduction    Hip internal rotation    Hip external rotation    Knee flexion    Knee extension    Ankle dorsiflexion    Ankle plantarflexion    Ankle inversion    Ankle eversion     (Blank rows = not tested)    FUNCTIONAL TESTS:  2 minute walk test: ***    TODAY'S TREATMENT:                                                                                                                              DATE: 07/17/23: Evaluation     PATIENT EDUCATION:  Education details: HEP Person educated: Patient Education method: Chief Technology Officer Education comprehension: verbalized understanding  HOME EXERCISE PROGRAM: ***  ASSESSMENT:  CLINICAL IMPRESSION: Patient is a 73 y.o. male who was seen today for physical therapy evaluation and treatment for s/p Rt AKA. The Pt has received his prothesis and is I in donning and doffing.    OBJECTIVE IMPAIRMENTS: Abnormal gait, decreased activity  tolerance, decreased balance, decreased knowledge of use of DME, difficulty walking, decreased strength, and pain.   ACTIVITY LIMITATIONS: carrying, lifting, standing, stairs, transfers, bathing, toileting, and locomotion level  PARTICIPATION LIMITATIONS: cleaning, shopping, and community activity  PERSONAL FACTORS: Age, Fitness, and 1-2 comorbidities: CKD  are also affecting patient's functional outcome.   REHAB POTENTIAL: Good  CLINICAL DECISION MAKING: Stable/uncomplicated  EVALUATION COMPLEXITY: Moderate   GOALS: Goals reviewed with patient? No  SHORT  TERM GOALS: Target date: *** *** Baseline: Goal status: INITIAL  2.  *** Baseline:  Goal status: INITIAL  3.  *** Baseline:  Goal status: INITIAL  4.  *** Baseline:  Goal status: INITIAL  5.  *** Baseline:  Goal status: INITIAL  6.  *** Baseline:  Goal status: INITIAL  LONG TERM GOALS: Target date: ***  *** Baseline:  Goal status: INITIAL  2.  *** Baseline:  Goal status: INITIAL  3.  *** Baseline:  Goal status: INITIAL  4.  *** Baseline:  Goal status: INITIAL  5.  *** Baseline:  Goal status: INITIAL  6.  *** Baseline:  Goal status: INITIAL   PLAN:  PT FREQUENCY: 2x/week  PT DURATION: 8 weeks  PLANNED INTERVENTIONS: {rehab planned interventions:25118::"Therapeutic exercises","Therapeutic activity","Neuromuscular re-education","Balance training","Gait training","Patient/Family education","Self Care","Joint mobilization"}  PLAN FOR NEXT SESSION: Virgina Organ, PT CLT [phone]  07/17/2023, 4:44 PM

## 2023-07-18 ENCOUNTER — Ambulatory Visit (HOSPITAL_COMMUNITY): Payer: Medicare Other | Admitting: Physical Therapy

## 2023-07-21 DIAGNOSIS — S78111A Complete traumatic amputation at level between right hip and knee, initial encounter: Secondary | ICD-10-CM | POA: Diagnosis not present

## 2023-07-21 DIAGNOSIS — N183 Chronic kidney disease, stage 3 unspecified: Secondary | ICD-10-CM | POA: Diagnosis not present

## 2023-07-27 ENCOUNTER — Encounter: Payer: Self-pay | Admitting: Physical Medicine and Rehabilitation

## 2023-07-27 ENCOUNTER — Encounter
Payer: Medicare Other | Attending: Physical Medicine and Rehabilitation | Admitting: Physical Medicine and Rehabilitation

## 2023-07-27 VITALS — BP 108/64 | HR 85 | Ht 72.0 in | Wt 224.0 lb

## 2023-07-27 DIAGNOSIS — R269 Unspecified abnormalities of gait and mobility: Secondary | ICD-10-CM | POA: Insufficient documentation

## 2023-07-27 DIAGNOSIS — S78111A Complete traumatic amputation at level between right hip and knee, initial encounter: Secondary | ICD-10-CM | POA: Insufficient documentation

## 2023-07-27 DIAGNOSIS — S80822D Blister (nonthermal), left lower leg, subsequent encounter: Secondary | ICD-10-CM | POA: Insufficient documentation

## 2023-07-27 MED ORDER — GABAPENTIN 100 MG PO CAPS: 200.0000 mg | ORAL_CAPSULE | Freq: Two times a day (BID) | ORAL | 5 refills | Status: DC

## 2023-07-27 NOTE — Progress Notes (Signed)
Subjective:    Patient ID: [name], male    DOB: 1951/04/25, 72 y.o.   MRN: [id number]  HPI  Pt is a 72 yr old male with hx of recent R AKA 03/05/23 due to PAD;  phantom/residual limb pain; also has HTN, CKD- latest Cr 1.73;  Acute on chronic anemia/ABLA; prediabetes, GERD Here for  f/u on R AKA.    No prosthesis today-didn't feel like wearing today.  Slows him down- slow process.   Hasn't gone to outpt PT yet.  Was supposed to go last Thursday- didn't have transportation- moved to 08/09/23.  Has to  get driver to take him.   Nerve pain- right now, not bad, - paining just a little at this moment.   Yesterday ,sharp pain like someone stabbing him in R foot and R leg is heavy, even though not there.   [facility] appt 8/9- to make sure fitting correctly.   Bowels working well- taking  Dulcolax capsules. It makes things work.  L ankle breakdown due to a bite? Blister that was large- was given cream- silvadene and an ABX- PO- is doing bette-r now scabbed.   Pain Inventory Average Pain 6 Pain Right Now 6 My pain is stabbing  In the last 24 hours, has pain interfered with the following? General activity 7 Relation with others 10 Enjoyment of life 10 What TIME of day is your pain at its worst? evening and night Sleep (in general) Good  Pain is worse with: sitting Pain improves with:  . Relief from Meds: 7  Family History  Problem Relation Age of Onset   Gastric cancer Mother    Prostate cancer Father    Breast cancer Neg Hx    Pancreatic cancer Neg Hx    Social History   Socioeconomic History   Marital status: Single    Spouse name: Not on file   Number of children: 1   Years of education: Not on file   Highest education level: Not on file  Occupational History    Comment: customer service   Tobacco Use   Smoking status: Former    Current packs/day: 0.00    Average packs/day: 0.3 packs/day for 21.0 years (5.3 ttl pk-yrs)    Types: Cigarettes    Start  date: 11/29/2001    Quit date: 11/29/2022    Years since quitting: 0.6    Passive exposure: Never   Smokeless tobacco: Never  Vaping Use   Vaping status: Never Used  Substance and Sexual Activity   Alcohol use: Never   Drug use: Never   Sexual activity: Not Currently  Other Topics Concern   Not on file  Social History Narrative   Has one son.    Social Determinants of Health   Financial Resource Strain: Not on file  Food Insecurity: No Food Insecurity (12/12/2022)   Hunger Vital Sign    Worried About Running Out of Food in the Last Year: Never true    Ran Out of Food in the Last Year: Never true  Transportation Needs: No Transportation Needs (12/12/2022)   PRAPARE - Administrator, Civil Service (Medical): No    Lack of Transportation (Non-Medical): No  Physical Activity: Not on file  Stress: Not on file  Social Connections: Not on file   Past Surgical History:  Procedure Laterality Date   ABDOMINAL AORTOGRAM W/LOWER EXTREMITY N/A 11/20/2022   Procedure: ABDOMINAL AORTOGRAM W/LOWER EXTREMITY;  Surgeon: Maeola Harman, MD;  Location: [facility]  INVASIVE CV LAB;  Service: Cardiovascular;  Laterality: N/A;   ABDOMINAL AORTOGRAM W/LOWER EXTREMITY N/A 03/05/2023   Procedure: ABDOMINAL AORTOGRAM W/LOWER EXTREMITY;  Surgeon: Maeola Harman, MD;  Location: [facility] INVASIVE CV LAB;  Service: Cardiovascular;  Laterality: N/A;   AMPUTATION Right 03/09/2023   Procedure: AMPUTATION ABOVE KNEE;  Surgeon: Maeola Harman, MD;  Location: [facility] OR;  Service: Vascular;  Laterality: Right;   COLONOSCOPY N/A 08/25/2020   Procedure: COLONOSCOPY;  Surgeon: Corbin Ade, MD;  [facility];  Service: Endoscopy;  Laterality: N/A;  9:30   COLONOSCOPY     CYSTOSCOPY WITH LITHOLAPAXY N/A 05/18/2021   Procedure: CYSTOSCOPY WITH LITHOLAPAXY;  Surgeon: Rene Paci, MD;  Location: [facility];  Service: Urology;  Laterality: N/A;  ONLY NEEDS   30 MIN   FEMORAL-TIBIAL BYPASS GRAFT Right 12/12/2022   Procedure: RIGHT COMMON FEMORAL-POSTERIOR TIBIAL ARTERY BYPASS WITH VEIN HARVESTING OF THE GREATER SAPHENOUS VEIN AND FEMORAL ENDARTERECTOMY WITH COMPOSITE GRAFT;  Surgeon: Maeola Harman, MD;  Location: [facility] OR;  Service: Vascular;  Laterality: Right;   POLYPECTOMY  08/25/2020   Procedure: POLYPECTOMY;  Surgeon: Corbin Ade, MD;  [facility];  Service: Endoscopy;;   ROBOT ASSISTED LAPAROSCOPIC RADICAL PROSTATECTOMY  2011   TONSILLECTOMY  age 83   and adenoids removed   Past Surgical History:  Procedure Laterality Date   ABDOMINAL AORTOGRAM W/LOWER EXTREMITY N/A 11/20/2022   Procedure: ABDOMINAL AORTOGRAM W/LOWER EXTREMITY;  Surgeon: Maeola Harman, MD;  Location: [facility] INVASIVE CV LAB;  Service: Cardiovascular;  Laterality: N/A;   ABDOMINAL AORTOGRAM W/LOWER EXTREMITY N/A 03/05/2023   Procedure: ABDOMINAL AORTOGRAM W/LOWER EXTREMITY;  Surgeon: Maeola Harman, MD;  Location: [facility] INVASIVE CV LAB;  Service: Cardiovascular;  Laterality: N/A;   AMPUTATION Right 03/09/2023   Procedure: AMPUTATION ABOVE KNEE;  Surgeon: Maeola Harman, MD;  Location: [facility] OR;  Service: Vascular;  Laterality: Right;   COLONOSCOPY N/A 08/25/2020   Procedure: COLONOSCOPY;  Surgeon: Corbin Ade, MD;  [facility];  Service: Endoscopy;  Laterality: N/A;  9:30   COLONOSCOPY     CYSTOSCOPY WITH LITHOLAPAXY N/A 05/18/2021   Procedure: CYSTOSCOPY WITH LITHOLAPAXY;  Surgeon: Rene Paci, MD;  Location: [facility];  Service: Urology;  Laterality: N/A;  ONLY NEEDS  30 MIN   FEMORAL-TIBIAL BYPASS GRAFT Right 12/12/2022   Procedure: RIGHT COMMON FEMORAL-POSTERIOR TIBIAL ARTERY BYPASS WITH VEIN HARVESTING OF THE GREATER SAPHENOUS VEIN AND FEMORAL ENDARTERECTOMY WITH COMPOSITE GRAFT;  Surgeon: Maeola Harman, MD;  Location: [facility] OR;  Service: Vascular;  Laterality: Right;    POLYPECTOMY  08/25/2020   Procedure: POLYPECTOMY;  Surgeon: Corbin Ade, MD;  [facility];  Service: Endoscopy;;   ROBOT ASSISTED LAPAROSCOPIC RADICAL PROSTATECTOMY  2011   TONSILLECTOMY  age 31   and adenoids removed   Past Medical History:  Diagnosis Date   Family history of metabolic acidosis with increased anion gap 03/05/2023   GERD (gastroesophageal reflux disease)    History of kidney stones    Hyperlipidemia    Hypertension    Prostate cancer [facility]) 2011   radiation june 2020   Umbilical hernia    Wears dentures    full   Wears glasses    Wears partial dentures    lower   BP 108/64   Pulse 85   Ht 6' (1.829 m) Comment: pt states, in wheelchair  Wt 224 lb (101.6 kg) Comment: pt states, in  wheelchair  SpO2 94%   BMI 30.38 kg/m   Opioid Risk Score:   Fall Risk Score:  `1  Depression screen [facility] 2/9     07/10/2023    9:11 AM 05/24/2023    1:30 PM 04/25/2023   11:07 AM 04/10/2023    8:14 AM  Depression screen PHQ 2/9  Decreased Interest 0 0 0 0  Down, Depressed, Hopeless 0 0 0 0  PHQ - 2 Score 0 0 0 0  Altered sleeping 0 0 0 0  Tired, decreased energy 0 0 0 0  Change in appetite 0 0 0 0  Feeling bad or failure about yourself  0 0 0 0  Trouble concentrating 0 0 0 0  Moving slowly or fidgety/restless 0 0 0 0  Suicidal thoughts 0 0 0 0  PHQ-9 Score 0 0 0 0  Difficult doing work/chores Not difficult at all Not difficult at all  Not difficult at all      Review of Systems  Musculoskeletal:        Stump pain  All other systems reviewed and are negative.     Objective:   Physical Exam  Awake, alert, appropriate, in manual w/c; accompanied by family member, NAD No skin breakdown on R AKA Wearing thick shrinker- for AKA.  No dog ears- good shaping.  MS; RLE- 5-/5 in HF- otherwise R AKA-  LLE-  5/5 in LLE L medial ankle- had blister- now large scab-     Assessment & Plan:   Pt is a 72 yr old male with hx of recent R AKA 03/05/23 due to PAD;   phantom/residual limb pain; also has HTN, CKD- latest Cr 1.73;  Acute on chronic anemia/ABLA; prediabetes, GERD Here for  f/u on R AKA.  Will increase gabapentin to 200 mg- and do 200 mg in Am and 200 mg in afternoon and 300 mg nightly- for phantom/nerve pain.   2. Can re-evaluate gabapentin in 6-12 months since nerve pain gets better  3. Bring prosthesis next appt to see you walk in it.   4.  Discussed, we don't want to lose L leg- because that would be really hard to walk.  Esp because he has one AKA- above knee amputation.   5. F/U in 3 months- f/u on R AKA   I spent a total of  22  minutes on total care today- >50% coordination of care- due to  Discussing nerve pain and R AKA- and how to address.

## 2023-07-27 NOTE — Patient Instructions (Signed)
Pt is a 72 yr old male with hx of recent R AKA 03/05/23 due to PAD;  phantom/residual limb pain; also has HTN, CKD- latest Cr 1.73;  Acute on chronic anemia/ABLA; prediabetes, GERD Here for  f/u on R AKA.  Will increase gabapentin to 200 mg- and do 200 mg in Am and 200 mg in afternoon and 300 mg nightly- for phantom/nerve pain.   2. Can re-evaluate gabapentin in 6-12 months since nerve pain gets better  3. Bring prosthesis next appt to see you walk in it.   4.  Discussed, we don't want to lose L leg- because that would be really hard to walk.  Esp because he has one AKA- above knee amputation.   5. F/U in 3 months- f/u on R AKA

## 2023-07-31 ENCOUNTER — Encounter: Payer: Medicare Other | Attending: Nephrology | Admitting: *Deleted

## 2023-07-31 VITALS — BP 106/71 | HR 97 | Temp 98.2°F | Resp 20

## 2023-07-31 DIAGNOSIS — D62 Acute posthemorrhagic anemia: Secondary | ICD-10-CM | POA: Diagnosis not present

## 2023-07-31 MED ORDER — ACETAMINOPHEN 325 MG PO TABS
650.0000 mg | ORAL_TABLET | Freq: Once | ORAL | Status: AC
  Administered 2023-07-31: 650 mg via ORAL

## 2023-07-31 MED ORDER — SODIUM CHLORIDE 0.9 % IV SOLN
510.0000 mg | Freq: Once | INTRAVENOUS | Status: AC
  Administered 2023-07-31: 510 mg via INTRAVENOUS
  Filled 2023-07-31: qty 17

## 2023-07-31 MED ORDER — DIPHENHYDRAMINE HCL 25 MG PO CAPS
25.0000 mg | ORAL_CAPSULE | Freq: Once | ORAL | Status: AC
  Administered 2023-07-31: 25 mg via ORAL

## 2023-07-31 NOTE — Progress Notes (Signed)
Diagnosis: Iron Deficiency Anemia  Provider:  Terrial Rhodes MD  Procedure: IV Infusion  IV Type: Peripheral, IV Location: R Antecubital  Feraheme (Ferumoxytol), Dose: 510 mg  Infusion Start Time: 1335  Infusion Stop Time: 1350  Post Infusion IV Care: Observation period completed  Discharge: Condition: Good, Destination: Home . AVS Provided  Performed by:  Daleen Squibb, RN

## 2023-08-07 ENCOUNTER — Encounter (INDEPENDENT_AMBULATORY_CARE_PROVIDER_SITE_OTHER): Payer: Medicare Other | Admitting: Emergency Medicine

## 2023-08-07 VITALS — BP 121/61 | HR 96 | Temp 98.2°F | Resp 20

## 2023-08-07 DIAGNOSIS — D62 Acute posthemorrhagic anemia: Secondary | ICD-10-CM | POA: Diagnosis not present

## 2023-08-07 MED ORDER — DIPHENHYDRAMINE HCL 25 MG PO CAPS
25.0000 mg | ORAL_CAPSULE | Freq: Once | ORAL | Status: AC
  Administered 2023-08-07: 25 mg via ORAL
  Filled 2023-08-07: qty 1

## 2023-08-07 MED ORDER — SODIUM CHLORIDE 0.9 % IV SOLN
510.0000 mg | Freq: Once | INTRAVENOUS | Status: AC
  Administered 2023-08-07: 510 mg via INTRAVENOUS
  Filled 2023-08-07: qty 17

## 2023-08-07 MED ORDER — ACETAMINOPHEN 325 MG PO TABS
650.0000 mg | ORAL_TABLET | Freq: Once | ORAL | Status: AC
  Administered 2023-08-07: 650 mg via ORAL
  Filled 2023-08-07: qty 2

## 2023-08-07 NOTE — Progress Notes (Signed)
Diagnosis: Iron Deficiency Anemia  Provider:  Terrial Rhodes MD  Procedure: IV Infusion  IV Type: Peripheral, IV Location: R Antecubital  Feraheme (Ferumoxytol), Dose: 510 mg  Infusion Start Time: 1038  Infusion Stop Time: 1054  Post Infusion IV Care: Observation period completed and Peripheral IV Discontinued  Discharge: Condition: Good, Destination: Home . AVS Provided  Performed by:  Arrie Senate, RN

## 2023-08-07 NOTE — Addendum Note (Signed)
Addended by: Estanislado Emms T on: 08/07/2023 02:49 PM   Modules accepted: Orders

## 2023-08-09 ENCOUNTER — Encounter (HOSPITAL_COMMUNITY): Payer: Self-pay | Admitting: Physical Therapy

## 2023-08-09 ENCOUNTER — Ambulatory Visit (HOSPITAL_COMMUNITY): Payer: Medicare Other | Attending: Physical Medicine and Rehabilitation | Admitting: Physical Therapy

## 2023-08-09 ENCOUNTER — Other Ambulatory Visit: Payer: Self-pay

## 2023-08-09 DIAGNOSIS — M6281 Muscle weakness (generalized): Secondary | ICD-10-CM | POA: Diagnosis not present

## 2023-08-09 DIAGNOSIS — R2689 Other abnormalities of gait and mobility: Secondary | ICD-10-CM | POA: Insufficient documentation

## 2023-08-09 DIAGNOSIS — Z89611 Acquired absence of right leg above knee: Secondary | ICD-10-CM | POA: Diagnosis not present

## 2023-08-09 NOTE — Therapy (Signed)
OUTPATIENT PHYSICAL THERAPY LOWER EXTREMITY EVALUATION   Patient Name: AKSHAJ SPARHAWK MRN: [id number] DOB:1951/01/24, 72 y.o., male Today's Date: 08/09/2023  END OF SESSION:  PT End of Session - 08/09/23 0731     Visit Number 1    Number of Visits 12    Date for PT Re-Evaluation 09/20/23    Authorization Type [employer] Medicare    Progress Note Due on Visit 10    PT Start Time 0731    PT Stop Time 0815    PT Time Calculation (min) 44 min    Activity Tolerance Patient tolerated treatment well    Behavior During Therapy [facility] for tasks assessed/performed             Past Medical History:  Diagnosis Date   Family history of metabolic acidosis with increased anion gap 03/05/2023   GERD (gastroesophageal reflux disease)    History of kidney stones    Hyperlipidemia    Hypertension    Prostate cancer [facility]) 2011   radiation june 2020   Umbilical hernia    Wears dentures    full   Wears glasses    Wears partial dentures    lower   Past Surgical History:  Procedure Laterality Date   ABDOMINAL AORTOGRAM W/LOWER EXTREMITY N/A 11/20/2022   Procedure: ABDOMINAL AORTOGRAM W/LOWER EXTREMITY;  Surgeon: Maeola Harman, MD;  Location: [facility] INVASIVE CV LAB;  Service: Cardiovascular;  Laterality: N/A;   ABDOMINAL AORTOGRAM W/LOWER EXTREMITY N/A 03/05/2023   Procedure: ABDOMINAL AORTOGRAM W/LOWER EXTREMITY;  Surgeon: Maeola Harman, MD;  Location: [facility] INVASIVE CV LAB;  Service: Cardiovascular;  Laterality: N/A;   AMPUTATION Right 03/09/2023   Procedure: AMPUTATION ABOVE KNEE;  Surgeon: Maeola Harman, MD;  Location: [facility] OR;  Service: Vascular;  Laterality: Right;   COLONOSCOPY N/A 08/25/2020   Procedure: COLONOSCOPY;  Surgeon: Corbin Ade, MD;  [facility];  Service: Endoscopy;  Laterality: N/A;  9:30   COLONOSCOPY     CYSTOSCOPY WITH LITHOLAPAXY N/A 05/18/2021   Procedure: CYSTOSCOPY WITH LITHOLAPAXY;  Surgeon: Rene Paci, MD;   Location: [facility] [place];  Service: Urology;  Laterality: N/A;  ONLY NEEDS  30 MIN   FEMORAL-TIBIAL BYPASS GRAFT Right 12/12/2022   Procedure: RIGHT COMMON FEMORAL-POSTERIOR TIBIAL ARTERY BYPASS WITH VEIN HARVESTING OF THE GREATER SAPHENOUS VEIN AND FEMORAL ENDARTERECTOMY WITH COMPOSITE GRAFT;  Surgeon: Maeola Harman, MD;  Location: [facility] OR;  Service: Vascular;  Laterality: Right;   POLYPECTOMY  08/25/2020   Procedure: POLYPECTOMY;  Surgeon: Corbin Ade, MD;  [facility];  Service: Endoscopy;;   ROBOT ASSISTED LAPAROSCOPIC RADICAL PROSTATECTOMY  2011   TONSILLECTOMY  age 74   and adenoids removed   Patient Active Problem List   Diagnosis Date Noted   Abnormal gait 07/27/2023   Hypertension 07/10/2023   Blister of left leg 05/24/2023   Acute blood loss anemia 03/27/2023   Phantom pain after amputation of lower extremity (HCC) 03/27/2023   Adjustment disorder with mixed anxiety and depressed mood 03/15/2023   Above knee amputation of right lower extremity (HCC) 03/13/2023   CKD (chronic kidney disease) stage 3, GFR 30-59 ml/min (HCC) 03/13/2023   Cellulitis of right lower extremity 03/09/2023   Fever 03/09/2023   Non-traumatic rhabdomyolysis 03/09/2023   Bandemia 03/09/2023   Acute kidney injury superimposed on chronic kidney disease (HCC) 03/05/2023   Hyperkalemia 03/05/2023   Normocytic anemia 03/05/2023   History of prostate cancer 03/05/2023   Essential hypertension 03/05/2023  Metabolic acidosis, increased anion gap 03/05/2023   Critical limb ischemia of right lower extremity (HCC) 12/12/2022   Malignant neoplasm of prostate (HCC) 03/18/2019    PCP: Rica Records FNP  REFERRING PROVIDER: Genice Rouge, MD  REFERRING DIAG: [phone] (ICD-10-CM) - S/P AKA (above knee amputation), right (HCC)  THERAPY DIAG:  Other abnormalities of gait and mobility  Muscle weakness (generalized)  S/P AKA (above knee amputation), right  (HCC)  Rationale for Evaluation and Treatment: Rehabilitation  ONSET DATE: 03/09/23  SUBJECTIVE:   SUBJECTIVE STATEMENT: Patient states he did a lot of therapy at the hospital following amputation. He has been using RW to get around. He got his prosthetic July 19th. Patient is frustrated with his fatigue, gait speed, dressing difficulties. Is mostly just walking around in the house at this point. Has to take breaks with walking in the house due to fatiuge. He enjoys baking cakes. Has to encourage himself to use leg more. Has not used leg 8 hours yet. Had some wounds prior to amputation. Has some phantom pain.   PERTINENT HISTORY: R AKA 03/09/23, admitted CIR 03/13/23-03/26/23, HTN, HLD, hx prostate cancer PAIN:  Are you having pain? Yes: NPRS scale: 4/10 Pain location: R quad region Pain description: sharp Aggravating factors: constant Relieving factors: movement, meds  PRECAUTIONS: Fall  WEIGHT BEARING RESTRICTIONS: No  FALLS:  Has patient fallen in last 6 months? No   OCCUPATION: Out on leave  PLOF: Independent  PATIENT GOALS: to be walking better like he isn't using a prosthetic     OBJECTIVE: (objective measures from initial evaluation unless otherwise dated)   COGNITION: Overall cognitive status: Within functional limits for tasks assessed     SENSATION: WFL   POSTURE: No Significant postural limitations  PALPATION: No significant limitations  LOWER EXTREMITY ROM: slightly decreased R hip extension ROM  Active ROM Right eval Left eval  Hip flexion    Hip extension    Hip abduction    Hip adduction    Hip internal rotation    Hip external rotation    Knee flexion    Knee extension    Ankle dorsiflexion    Ankle plantarflexion    Ankle inversion    Ankle eversion     (Blank rows = not tested) *= pain/symptoms  LOWER EXTREMITY MMT:  MMT Right eval Left eval  Hip flexion 4 (limited range in seated) 4+  Hip extension 3- 3+  Hip abduction 3- 4-   Hip adduction    Hip internal rotation    Hip external rotation    Knee flexion  5  Knee extension  5  Ankle dorsiflexion  5  Ankle plantarflexion    Ankle inversion    Ankle eversion     (Blank rows = not tested) *= pain/symptoms    FUNCTIONAL TESTS:  5 times sit to stand: 53.94 seconds with UE support on chair and RW, relies LLE Timed up and go (TUG): 2: 18 with RW (includes time to release air from prosthetic upon standing) 2 minute walk test: 24 feet with RW  GAIT: Distance walked: 24 feet Assistive device utilized: Walker - 2 wheeled Level of assistance: SBA Comments: , step too pattern   TODAY'S TREATMENT:  DATE:  08/09/23 Eval and HEP    PATIENT EDUCATION:  Education details: Patient educated on exam findings, POC, scope of PT, HEP, and prone lying to promote hip extension, skin checks. Person educated: Patient Education method: Explanation, Demonstration, and Handouts Education comprehension: verbalized understanding, returned demonstration, verbal cues required, and tactile cues required  HOME EXERCISE PROGRAM: Access Code: [email or web address] URL: [email or web address]  Date: 08/09/2023 - Prone on Elbows Stretch  - 3 x daily - 7 x weekly - 10-20 minutes hold - Standing March with Counter Support  - 3 x daily - 7 x weekly - 2 sets - 10 reps - Standing Hip Abduction with Counter Support  - 3 x daily - 7 x weekly - 2 sets - 10 reps - Standing Hip Extension with Counter Support  - 3 x daily - 7 x weekly - 2 sets - 10 reps  ASSESSMENT:  CLINICAL IMPRESSION: Patient a 72 y.o. y.o. male who was seen today for physical therapy evaluation and treatment for s/p R AKA. Patient presents with deficits in LE strength, ROM, endurance, activity tolerance, gait, balance, and functional mobility with ADL. Patient is having to modify and restrict  ADL as indicated by outcome measure score as well as subjective information and objective measures which is affecting overall participation. Patient will benefit from skilled physical therapy in order to improve function and reduce impairment.  OBJECTIVE IMPAIRMENTS: Abnormal gait, decreased activity tolerance, decreased balance, decreased mobility, difficulty walking, decreased ROM, decreased strength, increased muscle spasms, impaired flexibility, and improper body mechanics.   ACTIVITY LIMITATIONS: carrying, lifting, bending, standing, squatting, stairs, transfers, bathing, locomotion level, and caring for others  PARTICIPATION LIMITATIONS: meal prep, cleaning, laundry, shopping, community activity, and yard work  PERSONAL FACTORS: Fitness and 3+ comorbidities: R AKA, HTN, HLD  are also affecting patient's functional outcome.   REHAB POTENTIAL: Good  CLINICAL DECISION MAKING: Stable/uncomplicated  EVALUATION COMPLEXITY: Low   GOALS: Goals reviewed with patient? Yes  SHORT TERM GOALS: Target date: 08/30/2023    Patient will be independent with HEP in order to improve functional outcomes. Baseline: Goal status: INITIAL  2.  Patient will report at least 25% improvement in symptoms for improved quality of life. Baseline: Goal status: INITIAL    LONG TERM GOALS: Target date: 09/20/2023    Patient will report at least 75% improvement in symptoms for improved quality of life. Baseline:  Goal status: INITIAL  2.   Patient will demonstrate grade of 5/5 MMT grade in all tested musculature as evidence of improved strength to assist with stair ambulation and gait.   Baseline: see above Goal status: INITIAL  3.  Patient will be able to ambulate at least 100 feet in  with LRAD in order to demonstrate improved gait speed for community ambulation. Baseline: 24 feet with RW Goal status: INITIAL  4.  Patient will be able to complete TUG in under 24.3 seconds in order to demonstrate  improved mobility to reduce risk for falls. Baseline: 2:18 with RW (includes time to release air from prosthetic upon standing) Goal status: INITIAL  5.  Patient will be able to complete 5x STS in under 20 seconds in order to demo improved functional strength. Baseline: 53.94 seconds with UE support on chair and RW, relies LLE Goal status: INITIAL     PLAN:  PT FREQUENCY: 2x/week  PT DURATION: 6 weeks  PLANNED INTERVENTIONS: Therapeutic exercises, Therapeutic activity, Neuromuscular re-education, Balance training, Gait training, Patient/Family education, Joint manipulation, Joint mobilization, Stair training, Orthotic/Fit training,  DME instructions, Aquatic Therapy, Dry Needling, Electrical stimulation, Spinal manipulation, Spinal mobilization, Cryotherapy, Moist heat, Compression bandaging, scar mobilization, Splintting, Taping, Traction, Ultrasound, Ionotophoresis 4mg /ml Dexamethasone, and Manual therapy  PLAN FOR NEXT SESSION: f/u with HEP, hip mobility and hip strength, functional strength, balance and gait training, endurance   Reola Mosher , PT 08/09/2023, 8:11 AM

## 2023-08-13 ENCOUNTER — Ambulatory Visit (HOSPITAL_COMMUNITY): Payer: Medicare Other

## 2023-08-13 ENCOUNTER — Encounter (HOSPITAL_COMMUNITY): Payer: Self-pay

## 2023-08-13 DIAGNOSIS — R2689 Other abnormalities of gait and mobility: Secondary | ICD-10-CM | POA: Diagnosis not present

## 2023-08-13 DIAGNOSIS — Z89611 Acquired absence of right leg above knee: Secondary | ICD-10-CM | POA: Diagnosis not present

## 2023-08-13 DIAGNOSIS — M6281 Muscle weakness (generalized): Secondary | ICD-10-CM | POA: Diagnosis not present

## 2023-08-13 NOTE — Therapy (Addendum)
OUTPATIENT PHYSICAL THERAPY LOWER EXTREMITY TREATMENT   Patient Name: [name] MRN: [id number] DOB:16-Feb-1951, 72 y.o., male Today's Date: 08/13/2023  END OF SESSION:  PT End of Session - 08/13/23 0907     Visit Number 2    Number of Visits 12    Date for PT Re-Evaluation 09/20/23    Authorization Type [employer] Medicare    Authorization - Visit Number 1    Authorization - Number of Visits 12    Progress Note Due on Visit 10    PT Start Time 0816    PT Stop Time 0902    PT Time Calculation (min) 46 min    Activity Tolerance Patient tolerated treatment well    Behavior During Therapy [facility] for tasks assessed/performed              Past Medical History:  Diagnosis Date   Family history of metabolic acidosis with increased anion gap 03/05/2023   GERD (gastroesophageal reflux disease)    History of kidney stones    Hyperlipidemia    Hypertension    Prostate cancer [facility]) 2011   radiation june 2020   Umbilical hernia    Wears dentures    full   Wears glasses    Wears partial dentures    lower   Past Surgical History:  Procedure Laterality Date   ABDOMINAL AORTOGRAM W/LOWER EXTREMITY N/A 11/20/2022   Procedure: ABDOMINAL AORTOGRAM W/LOWER EXTREMITY;  Surgeon: Maeola Harman, MD;  Location: [facility] INVASIVE CV LAB;  Service: Cardiovascular;  Laterality: N/A;   ABDOMINAL AORTOGRAM W/LOWER EXTREMITY N/A 03/05/2023   Procedure: ABDOMINAL AORTOGRAM W/LOWER EXTREMITY;  Surgeon: Maeola Harman, MD;  Location: [facility] INVASIVE CV LAB;  Service: Cardiovascular;  Laterality: N/A;   AMPUTATION Right 03/09/2023   Procedure: AMPUTATION ABOVE KNEE;  Surgeon: Maeola Harman, MD;  Location: [facility] OR;  Service: Vascular;  Laterality: Right;   COLONOSCOPY N/A 08/25/2020   Procedure: COLONOSCOPY;  Surgeon: Corbin Ade, MD;  [facility];  Service: Endoscopy;  Laterality: N/A;  9:30   COLONOSCOPY     CYSTOSCOPY WITH LITHOLAPAXY N/A 05/18/2021    Procedure: CYSTOSCOPY WITH LITHOLAPAXY;  Surgeon: Rene Paci, MD;  Location: [facility];  Service: Urology;  Laterality: N/A;  ONLY NEEDS  30 MIN   FEMORAL-TIBIAL BYPASS GRAFT Right 12/12/2022   Procedure: RIGHT COMMON FEMORAL-POSTERIOR TIBIAL ARTERY BYPASS WITH VEIN HARVESTING OF THE GREATER SAPHENOUS VEIN AND FEMORAL ENDARTERECTOMY WITH COMPOSITE GRAFT;  Surgeon: Maeola Harman, MD;  Location: [facility] OR;  Service: Vascular;  Laterality: Right;   POLYPECTOMY  08/25/2020   Procedure: POLYPECTOMY;  Surgeon: Corbin Ade, MD;  [facility];  Service: Endoscopy;;   ROBOT ASSISTED LAPAROSCOPIC RADICAL PROSTATECTOMY  2011   TONSILLECTOMY  age 5   and adenoids removed   Patient Active Problem List   Diagnosis Date Noted   Abnormal gait 07/27/2023   Hypertension 07/10/2023   Blister of left leg 05/24/2023   Acute blood loss anemia 03/27/2023   Phantom pain after amputation of lower extremity (HCC) 03/27/2023   Adjustment disorder with mixed anxiety and depressed mood 03/15/2023   Above knee amputation of right lower extremity (HCC) 03/13/2023   CKD (chronic kidney disease) stage 3, GFR 30-59 ml/min (HCC) 03/13/2023   Cellulitis of right lower extremity 03/09/2023   Fever 03/09/2023   Non-traumatic rhabdomyolysis 03/09/2023   Bandemia 03/09/2023   Acute kidney injury superimposed on chronic kidney disease (HCC) 03/05/2023   Hyperkalemia  03/05/2023   Normocytic anemia 03/05/2023   History of prostate cancer 03/05/2023   Essential hypertension 03/05/2023   Metabolic acidosis, increased anion gap 03/05/2023   Critical limb ischemia of right lower extremity (HCC) 12/12/2022   Malignant neoplasm of prostate (HCC) 03/18/2019    PCP: Rica Records FNP  REFERRING PROVIDER: Genice Rouge, MD  REFERRING DIAG: [phone] (ICD-10-CM) - S/P AKA (above knee amputation), right (HCC)  THERAPY DIAG:  Other abnormalities of gait and  mobility  Muscle weakness (generalized)  S/P AKA (above knee amputation), right (HCC)  Rationale for Evaluation and Treatment: Rehabilitation  ONSET DATE: 03/09/23  SUBJECTIVE:   SUBJECTIVE STATEMENT: Pt reports that his HEP is more difficult to complete than anticipated. No pain today. Need to regresss HEP. Pt given history regarding amputation and medical issues that arose post amputation.   Patient states he did a lot of therapy at the hospital following amputation. He has been using RW to get around. He got his prosthetic July 19th. Patient is frustrated with his fatigue, gait speed, dressing difficulties. Is mostly just walking around in the house at this point. Has to take breaks with walking in the house due to fatiuge. He enjoys baking cakes. Has to encourage himself to use leg more. Has not used leg 8 hours yet. Had some wounds prior to amputation. Has some phantom pain.   PERTINENT HISTORY: R AKA 03/09/23, admitted CIR 03/13/23-03/26/23, HTN, HLD, hx prostate cancer PAIN:  Are you having pain? Yes: NPRS scale: 4/10 Pain location: R quad region Pain description: sharp Aggravating factors: constant Relieving factors: movement, meds  PRECAUTIONS: Fall  WEIGHT BEARING RESTRICTIONS: No  FALLS:  Has patient fallen in last 6 months? No   OCCUPATION: Out on leave  PLOF: Independent  PATIENT GOALS: to be walking better like he isn't using a prosthetic     OBJECTIVE: (objective measures from initial evaluation unless otherwise dated)   COGNITION: Overall cognitive status: Within functional limits for tasks assessed     SENSATION: WFL   POSTURE: No Significant postural limitations  PALPATION: No significant limitations  LOWER EXTREMITY ROM: slightly decreased R hip extension ROM  Active ROM Right eval Left eval  Hip flexion    Hip extension    Hip abduction    Hip adduction    Hip internal rotation    Hip external rotation    Knee flexion    Knee  extension    Ankle dorsiflexion    Ankle plantarflexion    Ankle inversion    Ankle eversion     (Blank rows = not tested) *= pain/symptoms  LOWER EXTREMITY MMT:  MMT Right eval Left eval  Hip flexion 4 (limited range in seated) 4+  Hip extension 3- 3+  Hip abduction 3- 4-  Hip adduction    Hip internal rotation    Hip external rotation    Knee flexion  5  Knee extension  5  Ankle dorsiflexion  5  Ankle plantarflexion    Ankle inversion    Ankle eversion     (Blank rows = not tested) *= pain/symptoms    FUNCTIONAL TESTS:  5 times sit to stand: 53.94 seconds with UE support on chair and RW, relies LLE Timed up and go (TUG): 2: 18 with RW (includes time to release air from prosthetic upon standing) 2 minute walk test: 24 feet with RW  GAIT: Distance walked: 24 feet Assistive device utilized: Walker - 2 wheeled Level of assistance: SBA Comments: ,  step too pattern   TODAY'S TREATMENT:                                                                                                                              DATE:  08/13/2023  -Standing marching x 20 @ supervision level with RW. Cues for proper weight shifts.  -LLE 2 & 4in stepups x 20 with R weight shift -Standing balance with modified tandem with RLE stance limb with 3 x 1' with BUE manipulation. Facilitation bilaterally with lateral shifts within limits of stability.  -31ft x 2 with RW ambulation with RW- CGA level with cues for symmetrical weight shifts. Observed internal rotation of prosthetic limb throughout stance phase. Consistent circumduction of residual limb during swing and pumping RW while ambulating.    08/09/23 Eval and HEP    PATIENT EDUCATION:  Education details: Patient educated on exam findings, POC, scope of PT, HEP, and prone lying to promote hip extension, skin checks. Person educated: Patient Education method: Explanation, Demonstration, and Handouts Education comprehension: verbalized  understanding, returned demonstration, verbal cues required, and tactile cues required  HOME EXERCISE PROGRAM: Access Code: [email or web address] URL: [email or web address]  Date: 08/09/2023 - Prone on Elbows Stretch  - 3 x daily - 7 x weekly - 10-20 minutes hold - Standing March with Counter Support  - 3 x daily - 7 x weekly - 2 sets - 10 reps - Standing Hip Abduction with Counter Support  - 3 x daily - 7 x weekly - 2 sets - 10 reps - Standing Hip Extension with Counter Support  - 3 x daily - 7 x weekly - 2 sets - 10 reps  ASSESSMENT:  CLINICAL IMPRESSION: Pt tolerating session well. Instead of altering HEP, we discussed increased frequency and reduced time doing HEP during one bout. Pt in agreement. Discussed potential new RW due to prosthetic limb hitting RW during ambulation due to circumduction. Consistent cuing needed for limb proprioception and heavy emphasis on hip flexion thrust vs circumduction. Discussed support group for amputees, pt was not interested in information at this time. Pt will continue to benefit from skilled PT services to address deficits and improve functional capacity.   Patient a 72 y.o. y.o. male who was seen today for physical therapy evaluation and treatment for s/p R AKA. Patient presents with deficits in LE strength, ROM, endurance, activity tolerance, gait, balance, and functional mobility with ADL. Patient is having to modify and restrict ADL as indicated by outcome measure score as well as subjective information and objective measures which is affecting overall participation. Patient will benefit from skilled physical therapy in order to improve function and reduce impairment.  OBJECTIVE IMPAIRMENTS: Abnormal gait, decreased activity tolerance, decreased balance, decreased mobility, difficulty walking, decreased ROM, decreased strength, increased muscle spasms, impaired flexibility, and improper body mechanics.   ACTIVITY LIMITATIONS: carrying, lifting,  bending, standing, squatting, stairs, transfers, bathing, locomotion level, and caring for others  PARTICIPATION LIMITATIONS: meal prep, cleaning, laundry,  shopping, community activity, and yard work  PERSONAL FACTORS: Fitness and 3+ comorbidities: R AKA, HTN, HLD  are also affecting patient's functional outcome.   REHAB POTENTIAL: Good  CLINICAL DECISION MAKING: Stable/uncomplicated  EVALUATION COMPLEXITY: Low   GOALS: Goals reviewed with patient? Yes  SHORT TERM GOALS: Target date: 08/30/2023    Patient will be independent with HEP in order to improve functional outcomes. Baseline: Goal status: INITIAL  2.  Patient will report at least 25% improvement in symptoms for improved quality of life. Baseline: Goal status: INITIAL    LONG TERM GOALS: Target date: 09/20/2023    Patient will report at least 75% improvement in symptoms for improved quality of life. Baseline:  Goal status: INITIAL  2.   Patient will demonstrate grade of 5/5 MMT grade in all tested musculature as evidence of improved strength to assist with stair ambulation and gait.   Baseline: see above Goal status: INITIAL  3.  Patient will be able to ambulate at least 100 feet in  with LRAD in order to demonstrate improved gait speed for community ambulation. Baseline: 24 feet with RW Goal status: INITIAL  4.  Patient will be able to complete TUG in under 24.3 seconds in order to demonstrate improved mobility to reduce risk for falls. Baseline: 2:18 with RW (includes time to release air from prosthetic upon standing) Goal status: INITIAL  5.  Patient will be able to complete 5x STS in under 20 seconds in order to demo improved functional strength. Baseline: 53.94 seconds with UE support on chair and RW, relies LLE Goal status: INITIAL     PLAN:  PT FREQUENCY: 2x/week  PT DURATION: 6 weeks  PLANNED INTERVENTIONS: Therapeutic exercises, Therapeutic activity, Neuromuscular re-education, Balance  training, Gait training, Patient/Family education, Joint manipulation, Joint mobilization, Stair training, Orthotic/Fit training, DME instructions, Aquatic Therapy, Dry Needling, Electrical stimulation, Spinal manipulation, Spinal mobilization, Cryotherapy, Moist heat, Compression bandaging, scar mobilization, Splintting, Taping, Traction, Ultrasound, Ionotophoresis 4mg /ml Dexamethasone, and Manual therapy  PLAN FOR NEXT SESSION: f/u with HEP, hip mobility and hip strength, functional strength, balance and gait training, endurance  Nelida Meuse PT, DPT Physical Therapist with [facility] [phone] office  Nelida Meuse, PT 08/13/2023, 9:09 AM

## 2023-08-16 ENCOUNTER — Ambulatory Visit (HOSPITAL_COMMUNITY): Payer: Medicare Other

## 2023-08-16 DIAGNOSIS — M6281 Muscle weakness (generalized): Secondary | ICD-10-CM

## 2023-08-16 DIAGNOSIS — Z89611 Acquired absence of right leg above knee: Secondary | ICD-10-CM | POA: Diagnosis not present

## 2023-08-16 DIAGNOSIS — R2689 Other abnormalities of gait and mobility: Secondary | ICD-10-CM | POA: Diagnosis not present

## 2023-08-16 NOTE — Therapy (Signed)
OUTPATIENT PHYSICAL THERAPY LOWER EXTREMITY TREATMENT   Patient Name: [name] MRN: [id number] DOB:19-Mar-1951, 72 y.o., male Today's Date: 08/16/2023  END OF SESSION:  PT End of Session - 08/16/23 0827     Visit Number 3    Number of Visits 12    Date for PT Re-Evaluation 09/20/23    Authorization Type [employer]    Authorization - Number of Visits 12    Progress Note Due on Visit 10    PT Start Time 0820    PT Stop Time 0900    PT Time Calculation (min) 40 min    Activity Tolerance Patient tolerated treatment well    Behavior During Therapy [facility] for tasks assessed/performed               Past Medical History:  Diagnosis Date   Family history of metabolic acidosis with increased anion gap 03/05/2023   GERD (gastroesophageal reflux disease)    History of kidney stones    Hyperlipidemia    Hypertension    Prostate cancer [facility]) 2011   radiation june 2020   Umbilical hernia    Wears dentures    full   Wears glasses    Wears partial dentures    lower   Past Surgical History:  Procedure Laterality Date   ABDOMINAL AORTOGRAM W/LOWER EXTREMITY N/A 11/20/2022   Procedure: ABDOMINAL AORTOGRAM W/LOWER EXTREMITY;  Surgeon: Maeola Harman, MD;  Location: [facility] INVASIVE CV LAB;  Service: Cardiovascular;  Laterality: N/A;   ABDOMINAL AORTOGRAM W/LOWER EXTREMITY N/A 03/05/2023   Procedure: ABDOMINAL AORTOGRAM W/LOWER EXTREMITY;  Surgeon: Maeola Harman, MD;  Location: [facility] INVASIVE CV LAB;  Service: Cardiovascular;  Laterality: N/A;   AMPUTATION Right 03/09/2023   Procedure: AMPUTATION ABOVE KNEE;  Surgeon: Maeola Harman, MD;  Location: [facility] OR;  Service: Vascular;  Laterality: Right;   COLONOSCOPY N/A 08/25/2020   Procedure: COLONOSCOPY;  Surgeon: Corbin Ade, MD;  [facility];  Service: Endoscopy;  Laterality: N/A;  9:30   COLONOSCOPY     CYSTOSCOPY WITH LITHOLAPAXY N/A 05/18/2021   Procedure: CYSTOSCOPY WITH LITHOLAPAXY;   Surgeon: Rene Paci, MD;  Location: [facility];  Service: Urology;  Laterality: N/A;  ONLY NEEDS  30 MIN   FEMORAL-TIBIAL BYPASS GRAFT Right 12/12/2022   Procedure: RIGHT COMMON FEMORAL-POSTERIOR TIBIAL ARTERY BYPASS WITH VEIN HARVESTING OF THE GREATER SAPHENOUS VEIN AND FEMORAL ENDARTERECTOMY WITH COMPOSITE GRAFT;  Surgeon: Maeola Harman, MD;  Location: [facility] OR;  Service: Vascular;  Laterality: Right;   POLYPECTOMY  08/25/2020   Procedure: POLYPECTOMY;  Surgeon: Corbin Ade, MD;  Location: [facility];  Service: Endoscopy;;   ROBOT ASSISTED LAPAROSCOPIC RADICAL PROSTATECTOMY  2011   TONSILLECTOMY  age 72   and adenoids removed   Patient Active Problem List   Diagnosis Date Noted   Abnormal gait 07/27/2023   Hypertension 07/10/2023   Blister of left leg 05/24/2023   Acute blood loss anemia 03/27/2023   Phantom pain after amputation of lower extremity (HCC) 03/27/2023   Adjustment disorder with mixed anxiety and depressed mood 03/15/2023   Above knee amputation of right lower extremity (HCC) 03/13/2023   CKD (chronic kidney disease) stage 3, GFR 30-59 ml/min (HCC) 03/13/2023   Cellulitis of right lower extremity 03/09/2023   Fever 03/09/2023   Non-traumatic rhabdomyolysis 03/09/2023   Bandemia 03/09/2023   Acute kidney injury superimposed on chronic kidney disease (HCC) 03/05/2023   Hyperkalemia 03/05/2023   Normocytic anemia 03/05/2023  History of prostate cancer 03/05/2023   Essential hypertension 03/05/2023   Metabolic acidosis, increased anion gap 03/05/2023   Critical limb ischemia of right lower extremity (HCC) 12/12/2022   Malignant neoplasm of prostate (HCC) 03/18/2019    PCP: Rica Records FNP  REFERRING PROVIDER: Genice Rouge, MD  REFERRING DIAG: [id number] (ICD-10-CM) - S/P AKA (above knee amputation), right (HCC)  THERAPY DIAG:  No diagnosis found.  Rationale for Evaluation and Treatment:  Rehabilitation  ONSET DATE: 03/09/23  SUBJECTIVE:   SUBJECTIVE STATEMENT:   Pt reports attempting HEP with some difficulty. Pt states that it feels like his stump has decreased in size.  -------------------------------------------------------------------------------------------------------------- Eval: Patient states he did a lot of therapy at the hospital following amputation. He has been using RW to get around. He got his prosthetic July 19th. Patient is frustrated with his fatigue, gait speed, dressing difficulties. Is mostly just walking around in the house at this point. Has to take breaks with walking in the house due to fatiuge. He enjoys baking cakes. Has to encourage himself to use leg more. Has not used leg 8 hours yet. Had some wounds prior to amputation. Has some phantom pain.   PERTINENT HISTORY: R AKA 03/09/23, admitted CIR 03/13/23-03/26/23, HTN, HLD, hx prostate cancer PAIN:  Are you having pain? Yes: NPRS scale: 4/10 Pain location: R quad region Pain description: sharp Aggravating factors: constant Relieving factors: movement, meds  PRECAUTIONS: Fall  WEIGHT BEARING RESTRICTIONS: No  FALLS:  Has patient fallen in last 6 months? No   OCCUPATION: Out on leave  PLOF: Independent  PATIENT GOALS: to be walking better like he isn't using a prosthetic     OBJECTIVE: (objective measures from initial evaluation unless otherwise dated)   COGNITION: Overall cognitive status: Within functional limits for tasks assessed     SENSATION: WFL   POSTURE: No Significant postural limitations  PALPATION: No significant limitations  LOWER EXTREMITY ROM: slightly decreased R hip extension ROM    LOWER EXTREMITY MMT:  MMT Right eval Left eval  Hip flexion 4 (limited range in seated) 4+  Hip extension 3- 3+  Hip abduction 3- 4-  Hip adduction    Hip internal rotation    Hip external rotation    Knee flexion  5  Knee extension  5  Ankle dorsiflexion  5  Ankle  plantarflexion    Ankle inversion    Ankle eversion     (Blank rows = not tested) *= pain/symptoms    FUNCTIONAL TESTS:  5 times sit to stand: 53.94 seconds with UE support on chair and RW, relies LLE Timed up and go (TUG): 2: 18 with RW (includes time to release air from prosthetic upon standing) 2 minute walk test: 24 feet with RW  GAIT: Distance walked: 24 feet Assistive device utilized: Walker - 2 wheeled Level of assistance: SBA Comments: , step too pattern *08/16/23: Patient ambulates using a step to gait pattern in rolling walker. Minimal increase in 1) lateral heel whip and 2) circumduction on right lower extremity during swing phase   TODAY'S TREATMENT:  DATE:  08/16/23  Neuromuscular re-education x70min -Seated hip abduction/ER with G theraband  -Partial and full S2S in // bars with focus on right wt shift   Gait Training x47min -2 point step to using rolling walker      08/13/2023  -Standing marching x 20 @ supervision level with RW. Cues for proper weight shifts.  -LLE 2 & 4in stepups x 20 with R weight shift -Standing balance with modified tandem with RLE stance limb with 3 x 1' with BUE manipulation. Facilitation bilaterally with lateral shifts within limits of stability.  -51ft x 2 with RW ambulation with RW- CGA level with cues for symmetrical weight shifts. Observed internal rotation of prosthetic limb throughout stance phase. Consistent circumduction of residual limb during swing and pumping RW while ambulating.    08/09/23 Eval and HEP    PATIENT EDUCATION:  Education details: Patient educated on exam findings, POC, scope of PT, HEP, and prone lying to promote hip extension, skin checks. Person educated: Patient Education method: Explanation, Demonstration, and Handouts Education comprehension: verbalized understanding,  returned demonstration, verbal cues required, and tactile cues required  HOME EXERCISE PROGRAM: Access Code: [email or web address] URL: [email or web address]  Date: 08/09/2023 - Prone on Elbows Stretch  - 3 x daily - 7 x weekly - 10-20 minutes hold - Standing March with Counter Support  - 3 x daily - 7 x weekly - 2 sets - 10 reps - Standing Hip Abduction with Counter Support  - 3 x daily - 7 x weekly - 2 sets - 10 reps - Standing Hip Extension with Counter Support  - 3 x daily - 7 x weekly - 2 sets - 10 reps  ASSESSMENT:  CLINICAL IMPRESSION: Pt tolerating session well.PT noticed prosthetic foot/knee into a internally rotated position. This can be caused by the socket or amputee weakness. Session focused on gait training/wt shifting onto right lower extremity to facilitate proper gait mechanics.  PT services to address deficits and improve functional capacity.   EvaL: Patient a 72 y.o. y.o. male who was seen today for physical therapy evaluation and treatment for s/p R AKA. Patient presents with deficits in LE strength, ROM, endurance, activity tolerance, gait, balance, and functional mobility with ADL. Patient is having to modify and restrict ADL as indicated by outcome measure score as well as subjective information and objective measures which is affecting overall participation. Patient will benefit from skilled physical therapy in order to improve function and reduce impairment.  OBJECTIVE IMPAIRMENTS: Abnormal gait, decreased activity tolerance, decreased balance, decreased mobility, difficulty walking, decreased ROM, decreased strength, increased muscle spasms, impaired flexibility, and improper body mechanics.   ACTIVITY LIMITATIONS: carrying, lifting, bending, standing, squatting, stairs, transfers, bathing, locomotion level, and caring for others  PARTICIPATION LIMITATIONS: meal prep, cleaning, laundry, shopping, community activity, and yard work  PERSONAL FACTORS: Fitness and 3+  comorbidities: R AKA, HTN, HLD  are also affecting patient's functional outcome.   REHAB POTENTIAL: Good  CLINICAL DECISION MAKING: Stable/uncomplicated  EVALUATION COMPLEXITY: Low   GOALS: Goals reviewed with patient? Yes  SHORT TERM GOALS: Target date: 08/30/2023    Patient will be independent with HEP in order to improve functional outcomes. Baseline: Goal status: INITIAL  2.  Patient will report at least 25% improvement in symptoms for improved quality of life. Baseline: Goal status: INITIAL    LONG TERM GOALS: Target date: 09/20/2023    Patient will report at least 75% improvement in symptoms for improved quality of life. Baseline:  Goal status: INITIAL  2.   Patient will demonstrate grade of 5/5 MMT grade in all tested musculature as evidence of improved strength to assist with stair ambulation and gait.   Baseline: see above Goal status: INITIAL  3.  Patient will be able to ambulate at least 100 feet in  with LRAD in order to demonstrate improved gait speed for community ambulation. Baseline: 24 feet with RW Goal status: INITIAL  4.  Patient will be able to complete TUG in under 24.3 seconds in order to demonstrate improved mobility to reduce risk for falls. Baseline: 2:18 with RW (includes time to release air from prosthetic upon standing) Goal status: INITIAL  5.  Patient will be able to complete 5x STS in under 20 seconds in order to demo improved functional strength. Baseline: 53.94 seconds with UE support on chair and RW, relies LLE Goal status: INITIAL     PLAN:  PT FREQUENCY: 2x/week  PT DURATION: 6 weeks  PLANNED INTERVENTIONS: Therapeutic exercises, Therapeutic activity, Neuromuscular re-education, Balance training, Gait training, Patient/Family education, Joint manipulation, Joint mobilization, Stair training, Orthotic/Fit training, DME instructions, Aquatic Therapy, Dry Needling, Electrical stimulation, Spinal manipulation, Spinal  mobilization, Cryotherapy, Moist heat, Compression bandaging, scar mobilization, Splintting, Taping, Traction, Ultrasound, Ionotophoresis 4mg /ml Dexamethasone, and Manual therapy  PLAN FOR NEXT SESSION: f/u with HEP, hip mobility and hip strength, functional strength, balance and gait training, endurance   Seymour Bars, PT 08/16/2023, 8:28 AM

## 2023-08-20 ENCOUNTER — Ambulatory Visit (HOSPITAL_COMMUNITY): Payer: Medicare Other

## 2023-08-20 DIAGNOSIS — R2689 Other abnormalities of gait and mobility: Secondary | ICD-10-CM

## 2023-08-20 DIAGNOSIS — Z89611 Acquired absence of right leg above knee: Secondary | ICD-10-CM

## 2023-08-20 DIAGNOSIS — M6281 Muscle weakness (generalized): Secondary | ICD-10-CM

## 2023-08-20 NOTE — Therapy (Signed)
OUTPATIENT PHYSICAL THERAPY LOWER EXTREMITY TREATMENT   Patient Name: [name] MRN: [id number] DOB:08/31/1951, 72 y.o., male Today's Date: 08/20/2023  END OF SESSION:  PT End of Session - 08/20/23 0818     Visit Number 4    Number of Visits 12    Date for PT Re-Evaluation 09/20/23    Authorization Type [employer]    Authorization - Visit Number 2    Authorization - Number of Visits 12    Progress Note Due on Visit 10    PT Start Time 0818    PT Stop Time 0858    PT Time Calculation (min) 40 min    Activity Tolerance Patient tolerated treatment well    Behavior During Therapy [facility] for tasks assessed/performed               Past Medical History:  Diagnosis Date   Family history of metabolic acidosis with increased anion gap 03/05/2023   GERD (gastroesophageal reflux disease)    History of kidney stones    Hyperlipidemia    Hypertension    Prostate cancer [facility]) 2011   radiation june 2020   Umbilical hernia    Wears dentures    full   Wears glasses    Wears partial dentures    lower   Past Surgical History:  Procedure Laterality Date   ABDOMINAL AORTOGRAM W/LOWER EXTREMITY N/A 11/20/2022   Procedure: ABDOMINAL AORTOGRAM W/LOWER EXTREMITY;  Surgeon: Maeola Harman, MD;  Location: [facility] INVASIVE CV LAB;  Service: Cardiovascular;  Laterality: N/A;   ABDOMINAL AORTOGRAM W/LOWER EXTREMITY N/A 03/05/2023   Procedure: ABDOMINAL AORTOGRAM W/LOWER EXTREMITY;  Surgeon: Maeola Harman, MD;  Location: [facility] INVASIVE CV LAB;  Service: Cardiovascular;  Laterality: N/A;   AMPUTATION Right 03/09/2023   Procedure: AMPUTATION ABOVE KNEE;  Surgeon: Maeola Harman, MD;  Location: [facility] OR;  Service: Vascular;  Laterality: Right;   COLONOSCOPY N/A 08/25/2020   Procedure: COLONOSCOPY;  Surgeon: Corbin Ade, MD;  [facility];  Service: Endoscopy;  Laterality: N/A;  9:30   COLONOSCOPY     CYSTOSCOPY WITH LITHOLAPAXY N/A 05/18/2021    Procedure: CYSTOSCOPY WITH LITHOLAPAXY;  Surgeon: Rene Paci, MD;  Location: [facility];  Service: Urology;  Laterality: N/A;  ONLY NEEDS  30 MIN   FEMORAL-TIBIAL BYPASS GRAFT Right 12/12/2022   Procedure: RIGHT COMMON FEMORAL-POSTERIOR TIBIAL ARTERY BYPASS WITH VEIN HARVESTING OF THE GREATER SAPHENOUS VEIN AND FEMORAL ENDARTERECTOMY WITH COMPOSITE GRAFT;  Surgeon: Maeola Harman, MD;  Location: [facility] OR;  Service: Vascular;  Laterality: Right;   POLYPECTOMY  08/25/2020   Procedure: POLYPECTOMY;  Surgeon: Corbin Ade, MD;  Location: [facility];  Service: Endoscopy;;   ROBOT ASSISTED LAPAROSCOPIC RADICAL PROSTATECTOMY  2011   TONSILLECTOMY  age 77   and adenoids removed   Patient Active Problem List   Diagnosis Date Noted   Abnormal gait 07/27/2023   Hypertension 07/10/2023   Blister of left leg 05/24/2023   Acute blood loss anemia 03/27/2023   Phantom pain after amputation of lower extremity (HCC) 03/27/2023   Adjustment disorder with mixed anxiety and depressed mood 03/15/2023   Above knee amputation of right lower extremity (HCC) 03/13/2023   CKD (chronic kidney disease) stage 3, GFR 30-59 ml/min (HCC) 03/13/2023   Cellulitis of right lower extremity 03/09/2023   Fever 03/09/2023   Non-traumatic rhabdomyolysis 03/09/2023   Bandemia 03/09/2023   Acute kidney injury superimposed on chronic kidney disease (HCC) 03/05/2023  Hyperkalemia 03/05/2023   Normocytic anemia 03/05/2023   History of prostate cancer 03/05/2023   Essential hypertension 03/05/2023   Metabolic acidosis, increased anion gap 03/05/2023   Critical limb ischemia of right lower extremity (HCC) 12/12/2022   Malignant neoplasm of prostate (HCC) 03/18/2019    PCP: Rica Records FNP  REFERRING PROVIDER: Genice Rouge, MD  REFERRING DIAG: [phone] (ICD-10-CM) - S/P AKA (above knee amputation), right (HCC)  THERAPY DIAG:  Other abnormalities of gait and  mobility  Muscle weakness (generalized)  S/P AKA (above knee amputation), right (HCC)  Rationale for Evaluation and Treatment: Rehabilitation  ONSET DATE: 03/09/23  SUBJECTIVE:   SUBJECTIVE STATEMENT:   Patient reports compliance with HEP.  Arrives walking with RW -------------------------------------------------------------------------------------------------------------- Eval: Patient states he did a lot of therapy at the hospital following amputation. He has been using RW to get around. He got his prosthetic July 19th. Patient is frustrated with his fatigue, gait speed, dressing difficulties. Is mostly just walking around in the house at this point. Has to take breaks with walking in the house due to fatiuge. He enjoys baking cakes. Has to encourage himself to use leg more. Has not used leg 8 hours yet. Had some wounds prior to amputation. Has some phantom pain.   PERTINENT HISTORY: R AKA 03/09/23, admitted CIR 03/13/23-03/26/23, HTN, HLD, hx prostate cancer PAIN:  Are you having pain? Yes: NPRS scale: 4/10 Pain location: R quad region Pain description: sharp Aggravating factors: constant Relieving factors: movement, meds  PRECAUTIONS: Fall  WEIGHT BEARING RESTRICTIONS: No  FALLS:  Has patient fallen in last 6 months? No   OCCUPATION: Out on leave  PLOF: Independent  PATIENT GOALS: to be walking better like he isn't using a prosthetic     OBJECTIVE: (objective measures from initial evaluation unless otherwise dated)   COGNITION: Overall cognitive status: Within functional limits for tasks assessed     SENSATION: WFL   POSTURE: No Significant postural limitations  PALPATION: No significant limitations  LOWER EXTREMITY ROM: slightly decreased R hip extension ROM    LOWER EXTREMITY MMT:  MMT Right eval Left eval  Hip flexion 4 (limited range in seated) 4+  Hip extension 3- 3+  Hip abduction 3- 4-  Hip adduction    Hip internal rotation    Hip  external rotation    Knee flexion  5  Knee extension  5  Ankle dorsiflexion  5  Ankle plantarflexion    Ankle inversion    Ankle eversion     (Blank rows = not tested) *= pain/symptoms    FUNCTIONAL TESTS:  5 times sit to stand: 53.94 seconds with UE support on chair and RW, relies LLE Timed up and go (TUG): 2: 18 with RW (includes time to release air from prosthetic upon standing) 2 minute walk test: 24 feet with RW  GAIT: Distance walked: 24 feet Assistive device utilized: Walker - 2 wheeled Level of assistance: SBA Comments: , step too pattern *08/16/23: Patient ambulates using a step to gait pattern in rolling walker. Minimal increase in 1) lateral heel whip and 2) circumduction on right lower extremity during swing phase   TODAY'S TREATMENT:  DATE: 08/20/23 Seated Hip abduction BTB 2 x 10  Standing: Sidestepping // bars down and back x 2 Left toe tap on 4" step x 10; 2 hands then down to one hand Stagger step weight shift x 10 // bar ambulation with left hand only down and back x 2   08/16/23  Neuromuscular re-education x31min -Seated hip abduction/ER with G theraband  -Partial and full S2S in // bars with focus on right wt shift   Gait Training x32min -2 point step to using rolling walker      08/13/2023  -Standing marching x 20 @ supervision level with RW. Cues for proper weight shifts.  -LLE 2 & 4in stepups x 20 with R weight shift -Standing balance with modified tandem with RLE stance limb with 3 x 1' with BUE manipulation. Facilitation bilaterally with lateral shifts within limits of stability.  -30ft x 2 with RW ambulation with RW- CGA level with cues for symmetrical weight shifts. Observed internal rotation of prosthetic limb throughout stance phase. Consistent circumduction of residual limb during swing and pumping RW while  ambulating.    08/09/23 Eval and HEP    PATIENT EDUCATION:  Education details: Patient educated on exam findings, POC, scope of PT, HEP, and prone lying to promote hip extension, skin checks. Person educated: Patient Education method: Explanation, Demonstration, and Handouts Education comprehension: verbalized understanding, returned demonstration, verbal cues required, and tactile cues required  HOME EXERCISE PROGRAM: Access Code: [email or web address] URL: [email or web address]  Date: 08/09/2023 - Prone on Elbows Stretch  - 3 x daily - 7 x weekly - 10-20 minutes hold - Standing March with Counter Support  - 3 x daily - 7 x weekly - 2 sets - 10 reps - Standing Hip Abduction with Counter Support  - 3 x daily - 7 x weekly - 2 sets - 10 reps - Standing Hip Extension with Counter Support  - 3 x daily - 7 x weekly - 2 sets - 10 reps  ASSESSMENT:  CLINICAL IMPRESSION: Patient arrives with RW today; noted slight circumduction with swing phase right leg.  Last treating therapist notes improved gait from last session.  Tends to lean over to the left side more than right.  Progressed sitting abduction to blue theraband today with good challenge.  Worked on standing in // bars and weight shifting over to the right side. Patient with max challenge with toe tapping left foot on step; able to go down to using one hand for support but needs cues to tighten right glutes with shifting to the right side and continues with step to gait pattern; able to step past with cues although only able to perform a few steps.    Patient will benefit from continued skilled therapy services to address deficits and promote return to optimal function.     EvaL: Patient a 72 y.o. y.o. male who was seen today for physical therapy evaluation and treatment for s/p R AKA. Patient presents with deficits in LE strength, ROM, endurance, activity tolerance, gait, balance, and functional mobility with ADL. Patient is having to  modify and restrict ADL as indicated by outcome measure score as well as subjective information and objective measures which is affecting overall participation. Patient will benefit from skilled physical therapy in order to improve function and reduce impairment.  OBJECTIVE IMPAIRMENTS: Abnormal gait, decreased activity tolerance, decreased balance, decreased mobility, difficulty walking, decreased ROM, decreased strength, increased muscle spasms, impaired flexibility, and improper body mechanics.   ACTIVITY LIMITATIONS: carrying, lifting, bending,  standing, squatting, stairs, transfers, bathing, locomotion level, and caring for others  PARTICIPATION LIMITATIONS: meal prep, cleaning, laundry, shopping, community activity, and yard work  PERSONAL FACTORS: Fitness and 3+ comorbidities: R AKA, HTN, HLD  are also affecting patient's functional outcome.   REHAB POTENTIAL: Good  CLINICAL DECISION MAKING: Stable/uncomplicated  EVALUATION COMPLEXITY: Low   GOALS: Goals reviewed with patient? Yes  SHORT TERM GOALS: Target date: 08/30/2023    Patient will be independent with HEP in order to improve functional outcomes. Baseline: Goal status: INITIAL  2.  Patient will report at least 25% improvement in symptoms for improved quality of life. Baseline: Goal status: INITIAL    LONG TERM GOALS: Target date: 09/20/2023    Patient will report at least 75% improvement in symptoms for improved quality of life. Baseline:  Goal status: INITIAL  2.   Patient will demonstrate grade of 5/5 MMT grade in all tested musculature as evidence of improved strength to assist with stair ambulation and gait.   Baseline: see above Goal status: INITIAL  3.  Patient will be able to ambulate at least 100 feet in  with LRAD in order to demonstrate improved gait speed for community ambulation. Baseline: 24 feet with RW Goal status: INITIAL  4.  Patient will be able to complete TUG in under 24.3 seconds in  order to demonstrate improved mobility to reduce risk for falls. Baseline: 2:18 with RW (includes time to release air from prosthetic upon standing) Goal status: INITIAL  5.  Patient will be able to complete 5x STS in under 20 seconds in order to demo improved functional strength. Baseline: 53.94 seconds with UE support on chair and RW, relies LLE Goal status: INITIAL     PLAN:  PT FREQUENCY: 2x/week  PT DURATION: 6 weeks  PLANNED INTERVENTIONS: Therapeutic exercises, Therapeutic activity, Neuromuscular re-education, Balance training, Gait training, Patient/Family education, Joint manipulation, Joint mobilization, Stair training, Orthotic/Fit training, DME instructions, Aquatic Therapy, Dry Needling, Electrical stimulation, Spinal manipulation, Spinal mobilization, Cryotherapy, Moist heat, Compression bandaging, scar mobilization, Splintting, Taping, Traction, Ultrasound, Ionotophoresis 4mg /ml Dexamethasone, and Manual therapy  PLAN FOR NEXT SESSION: f/u with HEP, hip mobility and hip strength, functional strength, balance and gait training, endurance  9:06 AM, 08/20/23 Blimy Napoleon Small Temia Debroux MPT  physical therapy [place] [phone] Ph:[phone]

## 2023-08-21 DIAGNOSIS — N183 Chronic kidney disease, stage 3 unspecified: Secondary | ICD-10-CM | POA: Diagnosis not present

## 2023-08-21 DIAGNOSIS — S78111A Complete traumatic amputation at level between right hip and knee, initial encounter: Secondary | ICD-10-CM | POA: Diagnosis not present

## 2023-08-23 ENCOUNTER — Ambulatory Visit (HOSPITAL_COMMUNITY): Payer: Medicare Other

## 2023-08-23 DIAGNOSIS — Z89611 Acquired absence of right leg above knee: Secondary | ICD-10-CM | POA: Diagnosis not present

## 2023-08-23 DIAGNOSIS — M6281 Muscle weakness (generalized): Secondary | ICD-10-CM | POA: Diagnosis not present

## 2023-08-23 DIAGNOSIS — R2689 Other abnormalities of gait and mobility: Secondary | ICD-10-CM

## 2023-08-23 NOTE — Therapy (Signed)
OUTPATIENT PHYSICAL THERAPY LOWER EXTREMITY TREATMENT   Patient Name: [name] MRN: [id number] DOB:10-08-1951, 72 y.o., male Today's Date: 08/23/2023  END OF SESSION:  PT End of Session - 08/23/23 0821     Visit Number 5    Number of Visits 12    Date for PT Re-Evaluation 09/20/23    Authorization Type [employer] Medicare    Authorization - Number of Visits 12    Progress Note Due on Visit 10    PT Start Time 0817    PT Stop Time 0857    PT Time Calculation (min) 40 min    Activity Tolerance Patient tolerated treatment well    Behavior During Therapy [facility] for tasks assessed/performed                Past Medical History:  Diagnosis Date   Family history of metabolic acidosis with increased anion gap 03/05/2023   GERD (gastroesophageal reflux disease)    History of kidney stones    Hyperlipidemia    Hypertension    Prostate cancer [facility]) 2011   radiation june 2020   Umbilical hernia    Wears dentures    full   Wears glasses    Wears partial dentures    lower   Past Surgical History:  Procedure Laterality Date   ABDOMINAL AORTOGRAM W/LOWER EXTREMITY N/A 11/20/2022   Procedure: ABDOMINAL AORTOGRAM W/LOWER EXTREMITY;  Surgeon: Maeola Harman, MD;  Location: [facility] INVASIVE CV LAB;  Service: Cardiovascular;  Laterality: N/A;   ABDOMINAL AORTOGRAM W/LOWER EXTREMITY N/A 03/05/2023   Procedure: ABDOMINAL AORTOGRAM W/LOWER EXTREMITY;  Surgeon: Maeola Harman, MD;  Location: [facility] INVASIVE CV LAB;  Service: Cardiovascular;  Laterality: N/A;   AMPUTATION Right 03/09/2023   Procedure: AMPUTATION ABOVE KNEE;  Surgeon: Maeola Harman, MD;  Location: [facility] OR;  Service: Vascular;  Laterality: Right;   COLONOSCOPY N/A 08/25/2020   Procedure: COLONOSCOPY;  Surgeon: Corbin Ade, MD;  [facility];  Service: Endoscopy;  Laterality: N/A;  9:30   COLONOSCOPY     CYSTOSCOPY WITH LITHOLAPAXY N/A 05/18/2021   Procedure: CYSTOSCOPY WITH LITHOLAPAXY;   Surgeon: Rene Paci, MD;  Location: [facility];  Service: Urology;  Laterality: N/A;  ONLY NEEDS  30 MIN   FEMORAL-TIBIAL BYPASS GRAFT Right 12/12/2022   Procedure: RIGHT COMMON FEMORAL-POSTERIOR TIBIAL ARTERY BYPASS WITH VEIN HARVESTING OF THE GREATER SAPHENOUS VEIN AND FEMORAL ENDARTERECTOMY WITH COMPOSITE GRAFT;  Surgeon: Maeola Harman, MD;  Location: [facility] OR;  Service: Vascular;  Laterality: Right;   POLYPECTOMY  08/25/2020   Procedure: POLYPECTOMY;  Surgeon: Corbin Ade, MD;  [facility];  Service: Endoscopy;;   ROBOT ASSISTED LAPAROSCOPIC RADICAL PROSTATECTOMY  2011   TONSILLECTOMY  age 49   and adenoids removed   Patient Active Problem List   Diagnosis Date Noted   Abnormal gait 07/27/2023   Hypertension 07/10/2023   Blister of left leg 05/24/2023   Acute blood loss anemia 03/27/2023   Phantom pain after amputation of lower extremity (HCC) 03/27/2023   Adjustment disorder with mixed anxiety and depressed mood 03/15/2023   Above knee amputation of right lower extremity (HCC) 03/13/2023   CKD (chronic kidney disease) stage 3, GFR 30-59 ml/min (HCC) 03/13/2023   Cellulitis of right lower extremity 03/09/2023   Fever 03/09/2023   Non-traumatic rhabdomyolysis 03/09/2023   Bandemia 03/09/2023   Acute kidney injury superimposed on chronic kidney disease (HCC) 03/05/2023   Hyperkalemia 03/05/2023   Normocytic anemia 03/05/2023  History of prostate cancer 03/05/2023   Essential hypertension 03/05/2023   Metabolic acidosis, increased anion gap 03/05/2023   Critical limb ischemia of right lower extremity (HCC) 12/12/2022   Malignant neoplasm of prostate (HCC) 03/18/2019    PCP: Rica Records FNP  REFERRING PROVIDER: Genice Rouge, MD  REFERRING DIAG: [phone] (ICD-10-CM) - S/P AKA (above knee amputation), right (HCC)  THERAPY DIAG:  No diagnosis found.  Rationale for Evaluation and Treatment:  Rehabilitation  ONSET DATE: 03/09/23  SUBJECTIVE:   SUBJECTIVE STATEMENT:   Patient received in waiting room; ambulates into clinic using rolling walker with stand-by assist. Pt states he has prothesis f/u on 08/28/23.   -------------------------------------------------------------------------------------------------------------- Eval: Patient states he did a lot of therapy at the hospital following amputation. He has been using RW to get around. He got his prosthetic July 19th. Patient is frustrated with his fatigue, gait speed, dressing difficulties. Is mostly just walking around in the house at this point. Has to take breaks with walking in the house due to fatiuge. He enjoys baking cakes. Has to encourage himself to use leg more. Has not used leg 8 hours yet. Had some wounds prior to amputation. Has some phantom pain.   PERTINENT HISTORY: R AKA 03/09/23, admitted CIR 03/13/23-03/26/23, HTN, HLD, hx prostate cancer PAIN:  Are you having pain? Yes: NPRS scale: 4/10 Pain location: R quad region Pain description: sharp Aggravating factors: constant Relieving factors: movement, meds  PRECAUTIONS: Fall  WEIGHT BEARING RESTRICTIONS: No  FALLS:  Has patient fallen in last 6 months? No   OCCUPATION: Out on leave  PLOF: Independent  PATIENT GOALS: to be walking better like he isn't using a prosthetic     OBJECTIVE: (objective measures from initial evaluation unless otherwise dated)   COGNITION: Overall cognitive status: Within functional limits for tasks assessed     SENSATION: WFL   POSTURE: No Significant postural limitations  PALPATION: No significant limitations  LOWER EXTREMITY ROM: slightly decreased R hip extension ROM    LOWER EXTREMITY MMT:  MMT Right eval Left eval  Hip flexion 4 (limited range in seated) 4+  Hip extension 3- 3+  Hip abduction 3- 4-  Hip adduction    Hip internal rotation    Hip external rotation    Knee flexion  5  Knee extension   5  Ankle dorsiflexion  5  Ankle plantarflexion    Ankle inversion    Ankle eversion     (Blank rows = not tested) *= pain/symptoms    FUNCTIONAL TESTS:  5 times sit to stand: 53.94 seconds with UE support on chair and RW, relies LLE Timed up and go (TUG): 2: 18 with RW (includes time to release air from prosthetic upon standing) 2 minute walk test: 24 feet with RW  GAIT: Distance walked: 24 feet Assistive device utilized: Walker - 2 wheeled Level of assistance: SBA Comments: , step too pattern *08/16/23: Patient ambulates using a step to gait pattern in rolling walker. Minimal increase in 1) lateral heel whip and 2) circumduction on right lower extremity during swing phase   TODAY'S TREATMENT:  DATE:  08/23/23  Therapeutic exercise x42min  - Seated bilateral abduction with B theraband 2x10 Therapeutic Activity x25 -S2S with wt shift over right lower extremity in // bars, PT contact guard assist 3x5 -Standing wt shift from left to right lower extremity in // bars, PT CGA Gait Training x62min  -Clinic ambulation with wt shift to right lower extremity    08/20/23 Seated Hip abduction BTB 2 x 10 Standing: Sidestepping // bars down and back x 2 Left toe tap on 4" step x 10; 2 hands then down to one hand Stagger step weight shift x 10 // bar ambulation with left hand only down and back x 2   08/16/23 Neuromuscular re-education x59min -Seated hip abduction/ER with G theraband  -Partial and full S2S in // bars with focus on right wt shift  Gait Training x51min -2 point step to using rolling walker       PATIENT EDUCATION:  Education details: Patient educated on exam findings, POC, scope of PT, HEP, and prone lying to promote hip extension, skin checks. Person educated: Patient Education method: Explanation, Demonstration, and  Handouts Education comprehension: verbalized understanding, returned demonstration, verbal cues required, and tactile cues required  HOME EXERCISE PROGRAM: Access Code: [email or web address] URL: [email or web address]  Date: 08/09/2023 - Prone on Elbows Stretch  - 3 x daily - 7 x weekly - 10-20 minutes hold - Standing March with Counter Support  - 3 x daily - 7 x weekly - 2 sets - 10 reps - Standing Hip Abduction with Counter Support  - 3 x daily - 7 x weekly - 2 sets - 10 reps - Standing Hip Extension with Counter Support  - 3 x daily - 7 x weekly - 2 sets - 10 reps  ASSESSMENT:  CLINICAL IMPRESSION: Patient arrives with RW today; noted slight circumduction with swing phase right leg. Worked on standing in // bars and weight shifting over to the right side to facilitate right lower extremity strength. Patient will benefit from continued skilled therapy services to address deficits and promote return to optimal function.     EvaL: Patient a 72 y.o. y.o. male who was seen today for physical therapy evaluation and treatment for s/p R AKA. Patient presents with deficits in LE strength, ROM, endurance, activity tolerance, gait, balance, and functional mobility with ADL. Patient is having to modify and restrict ADL as indicated by outcome measure score as well as subjective information and objective measures which is affecting overall participation. Patient will benefit from skilled physical therapy in order to improve function and reduce impairment.  OBJECTIVE IMPAIRMENTS: Abnormal gait, decreased activity tolerance, decreased balance, decreased mobility, difficulty walking, decreased ROM, decreased strength, increased muscle spasms, impaired flexibility, and improper body mechanics.   ACTIVITY LIMITATIONS: carrying, lifting, bending, standing, squatting, stairs, transfers, bathing, locomotion level, and caring for others  PARTICIPATION LIMITATIONS: meal prep, cleaning, laundry, shopping,  community activity, and yard work  PERSONAL FACTORS: Fitness and 3+ comorbidities: R AKA, HTN, HLD  are also affecting patient's functional outcome.   REHAB POTENTIAL: Good  CLINICAL DECISION MAKING: Stable/uncomplicated  EVALUATION COMPLEXITY: Low   GOALS: Goals reviewed with patient? Yes  SHORT TERM GOALS: Target date: 08/30/2023    Patient will be independent with HEP in order to improve functional outcomes. Baseline: Goal status: INITIAL  2.  Patient will report at least 25% improvement in symptoms for improved quality of life. Baseline: Goal status: INITIAL    LONG TERM GOALS: Target date: 09/20/2023    Patient will  report at least 75% improvement in symptoms for improved quality of life. Baseline:  Goal status: INITIAL  2.   Patient will demonstrate grade of 5/5 MMT grade in all tested musculature as evidence of improved strength to assist with stair ambulation and gait.   Baseline: see above Goal status: INITIAL  3.  Patient will be able to ambulate at least 100 feet in  with LRAD in order to demonstrate improved gait speed for community ambulation. Baseline: 24 feet with RW Goal status: INITIAL  4.  Patient will be able to complete TUG in under 24.3 seconds in order to demonstrate improved mobility to reduce risk for falls. Baseline: 2:18 with RW (includes time to release air from prosthetic upon standing) Goal status: INITIAL  5.  Patient will be able to complete 5x STS in under 20 seconds in order to demo improved functional strength. Baseline: 53.94 seconds with UE support on chair and RW, relies LLE Goal status: INITIAL     PLAN:  PT FREQUENCY: 2x/week  PT DURATION: 6 weeks  PLANNED INTERVENTIONS: Therapeutic exercises, Therapeutic activity, Neuromuscular re-education, Balance training, Gait training, Patient/Family education, Joint manipulation, Joint mobilization, Stair training, Orthotic/Fit training, DME instructions, Aquatic Therapy, Dry  Needling, Electrical stimulation, Spinal manipulation, Spinal mobilization, Cryotherapy, Moist heat, Compression bandaging, scar mobilization, Splintting, Taping, Traction, Ultrasound, Ionotophoresis 4mg /ml Dexamethasone, and Manual therapy  PLAN FOR NEXT SESSION: f/u with HEP, hip mobility and hip strength, functional strength, balance and gait training, endurance  8:21 AM, 08/23/23 Amy Small Lynch MPT [place]  [phone] [phone]

## 2023-08-28 ENCOUNTER — Ambulatory Visit (HOSPITAL_COMMUNITY): Payer: Medicare Other | Attending: Physical Medicine and Rehabilitation

## 2023-08-28 DIAGNOSIS — Z89611 Acquired absence of right leg above knee: Secondary | ICD-10-CM | POA: Insufficient documentation

## 2023-08-28 DIAGNOSIS — M6281 Muscle weakness (generalized): Secondary | ICD-10-CM | POA: Diagnosis not present

## 2023-08-28 DIAGNOSIS — R2689 Other abnormalities of gait and mobility: Secondary | ICD-10-CM | POA: Diagnosis not present

## 2023-08-28 NOTE — Therapy (Signed)
OUTPATIENT PHYSICAL THERAPY LOWER EXTREMITY TREATMENT   Patient Name: [name] MRN: [id number] DOB:[birth year], 72 y.o., male Today's Date: 08/28/2023  END OF SESSION:  PT End of Session - 08/28/23 0819     Visit Number 6    Number of Visits 12    Date for PT Re-Evaluation 09/20/23    Authorization Type [employer] Medicare    Authorization - Number of Visits 12    Progress Note Due on Visit 10    PT Start Time 0819    PT Stop Time 0859    PT Time Calculation (min) 40 min    Activity Tolerance Patient tolerated treatment well    Behavior During Therapy [facility] for tasks assessed/performed                 Past Medical History:  Diagnosis Date   Family history of metabolic acidosis with increased anion gap 03/05/2023   GERD (gastroesophageal reflux disease)    History of kidney stones    Hyperlipidemia    Hypertension    Prostate cancer [facility]) 2011   radiation june 2020   Umbilical hernia    Wears dentures    full   Wears glasses    Wears partial dentures    lower   Past Surgical History:  Procedure Laterality Date   ABDOMINAL AORTOGRAM W/LOWER EXTREMITY N/A 11/20/2022   Procedure: ABDOMINAL AORTOGRAM W/LOWER EXTREMITY;  Surgeon: Maeola Harman, MD;  Location: [facility] INVASIVE CV LAB;  Service: Cardiovascular;  Laterality: N/A;   ABDOMINAL AORTOGRAM W/LOWER EXTREMITY N/A 03/05/2023   Procedure: ABDOMINAL AORTOGRAM W/LOWER EXTREMITY;  Surgeon: Maeola Harman, MD;  Location: [facility] INVASIVE CV LAB;  Service: Cardiovascular;  Laterality: N/A;   AMPUTATION Right 03/09/2023   Procedure: AMPUTATION ABOVE KNEE;  Surgeon: Maeola Harman, MD;  Location: [facility] OR;  Service: Vascular;  Laterality: Right;   COLONOSCOPY N/A 08/25/2020   Procedure: COLONOSCOPY;  Surgeon: Corbin Ade, MD;  [facility];  Service: Endoscopy;  Laterality: N/A;  9:30   COLONOSCOPY     CYSTOSCOPY WITH LITHOLAPAXY N/A 05/18/2021   Procedure: CYSTOSCOPY WITH LITHOLAPAXY;   Surgeon: Rene Paci, MD;  Location: [facility];  Service: Urology;  Laterality: N/A;  ONLY NEEDS  30 MIN   FEMORAL-TIBIAL BYPASS GRAFT Right 12/12/2022   Procedure: RIGHT COMMON FEMORAL-POSTERIOR TIBIAL ARTERY BYPASS WITH VEIN HARVESTING OF THE GREATER SAPHENOUS VEIN AND FEMORAL ENDARTERECTOMY WITH COMPOSITE GRAFT;  Surgeon: Maeola Harman, MD;  Location: [facility] OR;  Service: Vascular;  Laterality: Right;   POLYPECTOMY  08/25/2020   Procedure: POLYPECTOMY;  Surgeon: Corbin Ade, MD;  [facility];  Service: Endoscopy;;   ROBOT ASSISTED LAPAROSCOPIC RADICAL PROSTATECTOMY  2011   TONSILLECTOMY  age 37   and adenoids removed   Patient Active Problem List   Diagnosis Date Noted   Abnormal gait 07/27/2023   Hypertension 07/10/2023   Blister of left leg 05/24/2023   Acute blood loss anemia 03/27/2023   Phantom pain after amputation of lower extremity (HCC) 03/27/2023   Adjustment disorder with mixed anxiety and depressed mood 03/15/2023   Above knee amputation of right lower extremity (HCC) 03/13/2023   CKD (chronic kidney disease) stage 3, GFR 30-59 ml/min (HCC) 03/13/2023   Cellulitis of right lower extremity 03/09/2023   Fever 03/09/2023   Non-traumatic rhabdomyolysis 03/09/2023   Bandemia 03/09/2023   Acute kidney injury superimposed on chronic kidney disease (HCC) 03/05/2023   Hyperkalemia 03/05/2023   Normocytic anemia  03/05/2023   History of prostate cancer 03/05/2023   Essential hypertension 03/05/2023   Metabolic acidosis, increased anion gap 03/05/2023   Critical limb ischemia of right lower extremity (HCC) 12/12/2022   Malignant neoplasm of prostate (HCC) 03/18/2019    PCP: Rica Records FNP  REFERRING PROVIDER: Genice Rouge, MD  REFERRING DIAG: [phone] (ICD-10-CM) - S/P AKA (above knee amputation), right (HCC)  THERAPY DIAG:  Other abnormalities of gait and mobility  Muscle weakness (generalized)  S/P AKA  (above knee amputation), right (HCC)  Rationale for Evaluation and Treatment: Rehabilitation  ONSET DATE: 03/09/23  SUBJECTIVE:   SUBJECTIVE STATEMENT:   Patient received in waiting room; patient with increased fatigue today. Patient has f/u with [facility] for Prosthetic fitting.   -------------------------------------------------------------------------------------------------------------- Eval: Patient states he did a lot of therapy at the hospital following amputation. He has been using RW to get around. He got his prosthetic July 19th. Patient is frustrated with his fatigue, gait speed, dressing difficulties. Is mostly just walking around in the house at this point. Has to take breaks with walking in the house due to fatiuge. He enjoys baking cakes. Has to encourage himself to use leg more. Has not used leg 8 hours yet. Had some wounds prior to amputation. Has some phantom pain.   PERTINENT HISTORY: R AKA 03/09/23, admitted CIR 03/13/23-03/26/23, HTN, HLD, hx prostate cancer PAIN:  Are you having pain? Yes: NPRS scale: 4/10 Pain location: R quad region Pain description: sharp Aggravating factors: constant Relieving factors: movement, meds  PRECAUTIONS: Fall  WEIGHT BEARING RESTRICTIONS: No  FALLS:  Has patient fallen in last 6 months? No   OCCUPATION: Out on leave  PLOF: Independent  PATIENT GOALS: to be walking better like he isn't using a prosthetic     OBJECTIVE: (objective measures from initial evaluation unless otherwise dated)   COGNITION: Overall cognitive status: Within functional limits for tasks assessed     SENSATION: WFL   POSTURE: No Significant postural limitations  PALPATION: No significant limitations  LOWER EXTREMITY ROM: slightly decreased R hip extension ROM    LOWER EXTREMITY MMT:  MMT Right eval Left eval  Hip flexion 4 (limited range in seated) 4+  Hip extension 3- 3+  Hip abduction 3- 4-  Hip adduction    Hip internal rotation     Hip external rotation    Knee flexion  5  Knee extension  5  Ankle dorsiflexion  5  Ankle plantarflexion    Ankle inversion    Ankle eversion     (Blank rows = not tested) *= pain/symptoms    FUNCTIONAL TESTS:  5 times sit to stand: 53.94 seconds with UE support on chair and RW, relies LLE Timed up and go (TUG): 2: 18 with RW (includes time to release air from prosthetic upon standing) 2 minute walk test: 24 feet with RW  GAIT: Distance walked: 24 feet Assistive device utilized: Walker - 2 wheeled Level of assistance: SBA Comments: , step too pattern *08/16/23: Patient ambulates using a step to gait pattern in rolling walker. Minimal increase in 1) lateral heel whip and 2) circumduction on right lower extremity during swing phase   TODAY'S TREATMENT:  DATE:  08/28/23 Therapeutic exercise x57min -Seated bilateral hip marches 2x10 bilateral with 2#  -Standing bilateral hip abduction, 2x10 with 2# Therapeutic Activity x63min -Step to with wt shifts + large based cane in // bars   08/23/23 Therapeutic exercise x41min  - Seated bilateral abduction with B theraband 2x10 Therapeutic Activity x25 -S2S with wt shift over right lower extremity in // bars, PT contact guard assist 3x5 -Standing wt shift from left to right lower extremity in // bars, PT CGA Gait Training x37min  -Clinic ambulation with wt shift to right lower extremity    08/20/23 Seated Hip abduction BTB 2 x 10 Standing: Sidestepping // bars down and back x 2 Left toe tap on 4" step x 10; 2 hands then down to one hand Stagger step weight shift x 10 // bar ambulation with left hand only down and back x 2   PATIENT EDUCATION:  Education details: Patient educated on exam findings, POC, scope of PT, HEP, and prone lying to promote hip extension, skin checks. Person educated:  Patient Education method: Explanation, Demonstration, and Handouts Education comprehension: verbalized understanding, returned demonstration, verbal cues required, and tactile cues required  HOME EXERCISE PROGRAM: Access Code: [email or web address] URL: [email or web address]  Date: 08/09/2023 - Prone on Elbows Stretch  - 3 x daily - 7 x weekly - 10-20 minutes hold - Standing March with Counter Support  - 3 x daily - 7 x weekly - 2 sets - 10 reps - Standing Hip Abduction with Counter Support  - 3 x daily - 7 x weekly - 2 sets - 10 reps - Standing Hip Extension with Counter Support  - 3 x daily - 7 x weekly - 2 sets - 10 reps  ASSESSMENT:  CLINICAL IMPRESSION: Patient arrives with RW today; patient with increased fatigue today. PT focused on hip abduction strength today. PT to see pt this Friday after his prosthetic adjustment to see if gait deviations improve. Patient will benefit from continued skilled therapy services to address deficits and promote return to optimal function.     EvaL: Patient a 72 y.o. y.o. male who was seen today for physical therapy evaluation and treatment for s/p R AKA. Patient presents with deficits in LE strength, ROM, endurance, activity tolerance, gait, balance, and functional mobility with ADL. Patient is having to modify and restrict ADL as indicated by outcome measure score as well as subjective information and objective measures which is affecting overall participation. Patient will benefit from skilled physical therapy in order to improve function and reduce impairment.  OBJECTIVE IMPAIRMENTS: Abnormal gait, decreased activity tolerance, decreased balance, decreased mobility, difficulty walking, decreased ROM, decreased strength, increased muscle spasms, impaired flexibility, and improper body mechanics.   ACTIVITY LIMITATIONS: carrying, lifting, bending, standing, squatting, stairs, transfers, bathing, locomotion level, and caring for others  PARTICIPATION  LIMITATIONS: meal prep, cleaning, laundry, shopping, community activity, and yard work  PERSONAL FACTORS: Fitness and 3+ comorbidities: R AKA, HTN, HLD  are also affecting patient's functional outcome.   REHAB POTENTIAL: Good  CLINICAL DECISION MAKING: Stable/uncomplicated  EVALUATION COMPLEXITY: Low   GOALS: Goals reviewed with patient? Yes  SHORT TERM GOALS: Target date: 08/30/2023    Patient will be independent with HEP in order to improve functional outcomes. Baseline: Goal status: INITIAL  2.  Patient will report at least 25% improvement in symptoms for improved quality of life. Baseline: Goal status: INITIAL    LONG TERM GOALS: Target date: 09/20/2023    Patient will report at least 75% improvement  in symptoms for improved quality of life. Baseline:  Goal status: INITIAL  2.   Patient will demonstrate grade of 5/5 MMT grade in all tested musculature as evidence of improved strength to assist with stair ambulation and gait.   Baseline: see above Goal status: INITIAL  3.  Patient will be able to ambulate at least 100 feet in  with LRAD in order to demonstrate improved gait speed for community ambulation. Baseline: 24 feet with RW Goal status: INITIAL  4.  Patient will be able to complete TUG in under 24.3 seconds in order to demonstrate improved mobility to reduce risk for falls. Baseline: 2:18 with RW (includes time to release air from prosthetic upon standing) Goal status: INITIAL  5.  Patient will be able to complete 5x STS in under 20 seconds in order to demo improved functional strength. Baseline: 53.94 seconds with UE support on chair and RW, relies LLE Goal status: INITIAL     PLAN:  PT FREQUENCY: 2x/week  PT DURATION: 6 weeks  PLANNED INTERVENTIONS: Therapeutic exercises, Therapeutic activity, Neuromuscular re-education, Balance training, Gait training, Patient/Family education, Joint manipulation, Joint mobilization, Stair training,  Orthotic/Fit training, DME instructions, Aquatic Therapy, Dry Needling, Electrical stimulation, Spinal manipulation, Spinal mobilization, Cryotherapy, Moist heat, Compression bandaging, scar mobilization, Splintting, Taping, Traction, Ultrasound, Ionotophoresis 4mg /ml Dexamethasone, and Manual therapy  PLAN FOR NEXT SESSION: f/u with HEP, hip mobility and hip strength, functional strength, balance and gait training, endurance  8:24 AM, 08/28/23 Amy Small Lynch MPT [facility] [place] [phone] [phone]

## 2023-08-31 ENCOUNTER — Ambulatory Visit (HOSPITAL_COMMUNITY): Payer: Medicare Other

## 2023-08-31 DIAGNOSIS — Z89611 Acquired absence of right leg above knee: Secondary | ICD-10-CM

## 2023-08-31 DIAGNOSIS — R2689 Other abnormalities of gait and mobility: Secondary | ICD-10-CM

## 2023-08-31 DIAGNOSIS — M6281 Muscle weakness (generalized): Secondary | ICD-10-CM

## 2023-08-31 NOTE — Therapy (Signed)
OUTPATIENT PHYSICAL THERAPY LOWER EXTREMITY TREATMENT   Patient Name: [name] MRN: [id number] DOB:1951/12/03, 72 y.o., male Today's Date: 08/31/2023  END OF SESSION:  PT End of Session - 08/31/23 0825     Visit Number 7    Number of Visits 12    Date for PT Re-Evaluation 09/20/23    Authorization Type [employer] Medicare    Authorization - Number of Visits 12    Progress Note Due on Visit 10    PT Start Time 0819    PT Stop Time 0900    PT Time Calculation (min) 41 min    Activity Tolerance Patient tolerated treatment well    Behavior During Therapy [facility] for tasks assessed/performed                  Past Medical History:  Diagnosis Date   Family history of metabolic acidosis with increased anion gap 03/05/2023   GERD (gastroesophageal reflux disease)    History of kidney stones    Hyperlipidemia    Hypertension    Prostate cancer [facility]) 2011   radiation june 2020   Umbilical hernia    Wears dentures    full   Wears glasses    Wears partial dentures    lower   Past Surgical History:  Procedure Laterality Date   ABDOMINAL AORTOGRAM W/LOWER EXTREMITY N/A 11/20/2022   Procedure: ABDOMINAL AORTOGRAM W/LOWER EXTREMITY;  Surgeon: Maeola Harman, MD;  Location: [facility] INVASIVE CV LAB;  Service: Cardiovascular;  Laterality: N/A;   ABDOMINAL AORTOGRAM W/LOWER EXTREMITY N/A 03/05/2023   Procedure: ABDOMINAL AORTOGRAM W/LOWER EXTREMITY;  Surgeon: Maeola Harman, MD;  Location: [facility] INVASIVE CV LAB;  Service: Cardiovascular;  Laterality: N/A;   AMPUTATION Right 03/09/2023   Procedure: AMPUTATION ABOVE KNEE;  Surgeon: Maeola Harman, MD;  Location: [facility] OR;  Service: Vascular;  Laterality: Right;   COLONOSCOPY N/A 08/25/2020   Procedure: COLONOSCOPY;  Surgeon: Corbin Ade, MD;  [facility];  Service: Endoscopy;  Laterality: N/A;  9:30   COLONOSCOPY     CYSTOSCOPY WITH LITHOLAPAXY N/A 05/18/2021   Procedure: CYSTOSCOPY WITH  LITHOLAPAXY;  Surgeon: Rene Paci, MD;  Location: [facility];  Service: Urology;  Laterality: N/A;  ONLY NEEDS  30 MIN   FEMORAL-TIBIAL BYPASS GRAFT Right 12/12/2022   Procedure: RIGHT COMMON FEMORAL-POSTERIOR TIBIAL ARTERY BYPASS WITH VEIN HARVESTING OF THE GREATER SAPHENOUS VEIN AND FEMORAL ENDARTERECTOMY WITH COMPOSITE GRAFT;  Surgeon: Maeola Harman, MD;  Location: [facility] OR;  Service: Vascular;  Laterality: Right;   POLYPECTOMY  08/25/2020   Procedure: POLYPECTOMY;  Surgeon: Corbin Ade, MD;  [facility]: [facility];  Service: Endoscopy;;   ROBOT ASSISTED LAPAROSCOPIC RADICAL PROSTATECTOMY  2011   TONSILLECTOMY  age 51   and adenoids removed   Patient Active Problem List   Diagnosis Date Noted   Abnormal gait 07/27/2023   Hypertension 07/10/2023   Blister of left leg 05/24/2023   Acute blood loss anemia 03/27/2023   Phantom pain after amputation of lower extremity (HCC) 03/27/2023   Adjustment disorder with mixed anxiety and depressed mood 03/15/2023   Above knee amputation of right lower extremity (HCC) 03/13/2023   CKD (chronic kidney disease) stage 3, GFR 30-59 ml/min (HCC) 03/13/2023   Cellulitis of right lower extremity 03/09/2023   Fever 03/09/2023   Non-traumatic rhabdomyolysis 03/09/2023   Bandemia 03/09/2023   Acute kidney injury superimposed on chronic kidney disease (HCC) 03/05/2023   Hyperkalemia 03/05/2023   Normocytic  anemia 03/05/2023   History of prostate cancer 03/05/2023   Essential hypertension 03/05/2023   Metabolic acidosis, increased anion gap 03/05/2023   Critical limb ischemia of right lower extremity (HCC) 12/12/2022   Malignant neoplasm of prostate (HCC) 03/18/2019    PCP: Rica Records FNP  REFERRING PROVIDER: Genice Rouge, MD  REFERRING DIAG: [phone] (ICD-10-CM) - S/P AKA (above knee amputation), right (HCC)  THERAPY DIAG:  No diagnosis found.  Rationale for Evaluation and Treatment:  Rehabilitation  ONSET DATE: 03/09/23  SUBJECTIVE:   SUBJECTIVE STATEMENT:  Today: Patient received in waiting room with his rolling walker. Pt was able to have a f/u for his prosthetic; pt had prosthetic adjusted with notable improvements     Eval: Patient states he did a lot of therapy at the hospital following amputation. He has been using RW to get around. He got his prosthetic July 19th. Patient is frustrated with his fatigue, gait speed, dressing difficulties. Is mostly just walking around in the house at this point. Has to take breaks with walking in the house due to fatiuge. He enjoys baking cakes. Has to encourage himself to use leg more. Has not used leg 8 hours yet. Had some wounds prior to amputation. Has some phantom pain.   PERTINENT HISTORY: R AKA 03/09/23, admitted CIR 03/13/23-03/26/23, HTN, HLD, hx prostate cancer PAIN:  Are you having pain? Yes: NPRS scale: 4/10 Pain location: R quad region Pain description: sharp Aggravating factors: constant Relieving factors: movement, meds  PRECAUTIONS: Fall  WEIGHT BEARING RESTRICTIONS: No  FALLS:  Has patient fallen in last 6 months? No  Patient Survey(08/31/23) Lower Extremity Functional Scale (LEFS)  14 / 80 = 17.5 %    OCCUPATION: Out on leave  PLOF: Independent  PATIENT GOALS: to be walking better like he isn't using a prosthetic     OBJECTIVE: (objective measures from initial evaluation unless otherwise dated)   COGNITION: Overall cognitive status: Within functional limits for tasks assessed     SENSATION: WFL   POSTURE: No Significant postural limitations  PALPATION: No significant limitations  LOWER EXTREMITY ROM: slightly decreased R hip extension ROM    LOWER EXTREMITY MMT:  MMT Right eval Left eval  Hip flexion 4 (limited range in seated) 4+  Hip extension 3- 3+  Hip abduction 3- 4-  Hip adduction    Hip internal rotation    Hip external rotation    Knee flexion  5  Knee extension  5   Ankle dorsiflexion  5  Ankle plantarflexion    Ankle inversion    Ankle eversion     (Blank rows = not tested) *= pain/symptoms    FUNCTIONAL TESTS:  5 times sit to stand: 53.94 seconds with UE support on chair and RW, relies LLE Timed up and go (TUG): 2: 18 with RW (includes time to release air from prosthetic upon standing) 2 minute walk test: 24 feet with RW  GAIT: Distance walked: 24 feet Assistive device utilized: Walker - 2 wheeled Level of assistance: SBA Comments: , step too pattern *08/16/23: Patient ambulates using a step to gait pattern in rolling walker. Minimal increase in 1) lateral heel whip and 2) circumduction on right lower extremity during swing phase   TODAY'S TREATMENT:  DATE:  08/31/23 Gait Training x40 -2 pt Step to pattern in // bars -2 pt step to pattern using large base with PT moderate assist     08/28/23 Therapeutic exercise x71min -Seated bilateral hip marches 2x10 bilateral with 2#  -Standing bilateral hip abduction, 2x10 with 2# Therapeutic Activity x75min -Step to with wt shifts + large based cane in // bars   08/23/23 Therapeutic exercise x69min  - Seated bilateral abduction with B theraband 2x10 Therapeutic Activity x25 -S2S with wt shift over right lower extremity in // bars, PT contact guard assist 3x5 -Standing wt shift from left to right lower extremity in // bars, PT CGA Gait Training x49min  -Clinic ambulation with wt shift to right lower extremity    08/20/23 Seated Hip abduction BTB 2 x 10 Standing: Sidestepping // bars down and back x 2 Left toe tap on 4" step x 10; 2 hands then down to one hand Stagger step weight shift x 10 // bar ambulation with left hand only down and back x 2   PATIENT EDUCATION:  Education details: Patient educated on exam findings, POC, scope of PT, HEP, and prone lying  to promote hip extension, skin checks. Person educated: Patient Education method: Explanation, Demonstration, and Handouts Education comprehension: verbalized understanding, returned demonstration, verbal cues required, and tactile cues required  HOME EXERCISE PROGRAM: Access Code: [email or web address] URL: [email or web address]  Date: 08/09/2023 - Prone on Elbows Stretch  - 3 x daily - 7 x weekly - 10-20 minutes hold - Standing March with Counter Support  - 3 x daily - 7 x weekly - 2 sets - 10 reps - Standing Hip Abduction with Counter Support  - 3 x daily - 7 x weekly - 2 sets - 10 reps - Standing Hip Extension with Counter Support  - 3 x daily - 7 x weekly - 2 sets - 10 reps  ASSESSMENT:  CLINICAL IMPRESSION: Patient arrives with RW today; as per subjective, pt's prosthetic was adjusted at [employer]. Patient presents with no more lateral whip/circumduction of residual limb after the adjustment. PT attempted gait using a large based cane with a 2 pt step to pattern. Patient ambulated 10' until he felt moderate fatigue and had to sit due to knee buckling; PT moderate assistance. PT will introduce more cane Gait Training to facilitate independence. At this point, pt is very unsteady with cane.    EvaL: Patient a 72 y.o. y.o. male who was seen today for physical therapy evaluation and treatment for s/p R AKA. Patient presents with deficits in LE strength, ROM, endurance, activity tolerance, gait, balance, and functional mobility with ADL. Patient is having to modify and restrict ADL as indicated by outcome measure score as well as subjective information and objective measures which is affecting overall participation. Patient will benefit from skilled physical therapy in order to improve function and reduce impairment.  OBJECTIVE IMPAIRMENTS: Abnormal gait, decreased activity tolerance, decreased balance, decreased mobility, difficulty walking, decreased ROM, decreased strength, increased muscle  spasms, impaired flexibility, and improper body mechanics.   ACTIVITY LIMITATIONS: carrying, lifting, bending, standing, squatting, stairs, transfers, bathing, locomotion level, and caring for others  PARTICIPATION LIMITATIONS: meal prep, cleaning, laundry, shopping, community activity, and yard work  PERSONAL FACTORS: Fitness and 3+ comorbidities: R AKA, HTN, HLD  are also affecting patient's functional outcome.   REHAB POTENTIAL: Good  CLINICAL DECISION MAKING: Stable/uncomplicated  EVALUATION COMPLEXITY: Low   GOALS: Goals reviewed with patient? Yes  SHORT TERM GOALS: Target date: 08/30/2023  Patient will be independent with HEP in order to improve functional outcomes. Baseline: Goal status: INITIAL  2.  Patient will report at least 25% improvement in symptoms for improved quality of life. Baseline: Goal status: INITIAL    LONG TERM GOALS: Target date: 09/20/2023    Patient will report at least 75% improvement in symptoms for improved quality of life. Baseline:  Goal status: INITIAL  2.   Patient will demonstrate grade of 5/5 MMT grade in all tested musculature as evidence of improved strength to assist with stair ambulation and gait.   Baseline: see above Goal status: INITIAL  3.  Patient will be able to ambulate at least 100 feet in  with LRAD in order to demonstrate improved gait speed for community ambulation. Baseline: 24 feet with RW Goal status: INITIAL  4.  Patient will be able to complete TUG in under 24.3 seconds in order to demonstrate improved mobility to reduce risk for falls. Baseline: 2:18 with RW (includes time to release air from prosthetic upon standing) Goal status: INITIAL  5.  Patient will be able to complete 5x STS in under 20 seconds in order to demo improved functional strength. Baseline: 53.94 seconds with UE support on chair and RW, relies LLE Goal status: INITIAL     PLAN:  PT FREQUENCY: 2x/week  PT DURATION: 6  weeks  PLANNED INTERVENTIONS: Therapeutic exercises, Therapeutic activity, Neuromuscular re-education, Balance training, Gait training, Patient/Family education, Joint manipulation, Joint mobilization, Stair training, Orthotic/Fit training, DME instructions, Aquatic Therapy, Dry Needling, Electrical stimulation, Spinal manipulation, Spinal mobilization, Cryotherapy, Moist heat, Compression bandaging, scar mobilization, Splintting, Taping, Traction, Ultrasound, Ionotophoresis 4mg /ml Dexamethasone, and Manual therapy  PLAN FOR NEXT SESSION: hip mobility and hip strength, functional strength, balance and gait training, endurance  8:26 AM, 08/31/23 Amy Small Lynch MPT [facility] [place] [phone] Ph:[phone]

## 2023-09-03 ENCOUNTER — Ambulatory Visit (HOSPITAL_COMMUNITY): Payer: Medicare Other

## 2023-09-03 DIAGNOSIS — M6281 Muscle weakness (generalized): Secondary | ICD-10-CM | POA: Diagnosis not present

## 2023-09-03 DIAGNOSIS — R2689 Other abnormalities of gait and mobility: Secondary | ICD-10-CM | POA: Diagnosis not present

## 2023-09-03 DIAGNOSIS — Z89611 Acquired absence of right leg above knee: Secondary | ICD-10-CM | POA: Diagnosis not present

## 2023-09-03 NOTE — Therapy (Signed)
OUTPATIENT PHYSICAL THERAPY LOWER EXTREMITY TREATMENT   Patient Name: [name] MRN: [id number] DOB:29-Oct-1951, 72 y.o., male Today's Date: 09/03/2023  END OF SESSION:  PT End of Session - 09/03/23 0823     Visit Number 8    Number of Visits 12    Date for PT Re-Evaluation 09/20/23    Authorization Type [employer] Medicare    Authorization Time Period Requested    Authorization - Number of Visits 12    Progress Note Due on Visit 10    PT Start Time 0820    PT Stop Time 0900    PT Time Calculation (min) 40 min    Activity Tolerance Patient tolerated treatment well    Behavior During Therapy [facility] for tasks assessed/performed                  Past Medical History:  Diagnosis Date   Family history of metabolic acidosis with increased anion gap 03/05/2023   GERD (gastroesophageal reflux disease)    History of kidney stones    Hyperlipidemia    Hypertension    Prostate cancer [facility]) 2011   radiation june 2020   Umbilical hernia    Wears dentures    full   Wears glasses    Wears partial dentures    lower   Past Surgical History:  Procedure Laterality Date   ABDOMINAL AORTOGRAM W/LOWER EXTREMITY N/A 11/20/2022   Procedure: ABDOMINAL AORTOGRAM W/LOWER EXTREMITY;  Surgeon: Maeola Harman, MD;  Location: [facility] INVASIVE CV LAB;  Service: Cardiovascular;  Laterality: N/A;   ABDOMINAL AORTOGRAM W/LOWER EXTREMITY N/A 03/05/2023   Procedure: ABDOMINAL AORTOGRAM W/LOWER EXTREMITY;  Surgeon: Maeola Harman, MD;  Location: [facility] INVASIVE CV LAB;  Service: Cardiovascular;  Laterality: N/A;   AMPUTATION Right 03/09/2023   Procedure: AMPUTATION ABOVE KNEE;  Surgeon: Maeola Harman, MD;  Location: [facility] OR;  Service: Vascular;  Laterality: Right;   COLONOSCOPY N/A 08/25/2020   Procedure: COLONOSCOPY;  Surgeon: Corbin Ade, MD;  [facility];  Service: Endoscopy;  Laterality: N/A;  9:30   COLONOSCOPY     CYSTOSCOPY WITH LITHOLAPAXY N/A 05/18/2021    Procedure: CYSTOSCOPY WITH LITHOLAPAXY;  Surgeon: Rene Paci, MD;  Location: [facility];  Service: Urology;  Laterality: N/A;  ONLY NEEDS  30 MIN   FEMORAL-TIBIAL BYPASS GRAFT Right 12/12/2022   Procedure: RIGHT COMMON FEMORAL-POSTERIOR TIBIAL ARTERY BYPASS WITH VEIN HARVESTING OF THE GREATER SAPHENOUS VEIN AND FEMORAL ENDARTERECTOMY WITH COMPOSITE GRAFT;  Surgeon: Maeola Harman, MD;  Location: [facility] OR;  Service: Vascular;  Laterality: Right;   POLYPECTOMY  08/25/2020   Procedure: POLYPECTOMY;  Surgeon: Corbin Ade, MD;  [facility];  Service: Endoscopy;;   ROBOT ASSISTED LAPAROSCOPIC RADICAL PROSTATECTOMY  2011   TONSILLECTOMY  age 72   and adenoids removed   Patient Active Problem List   Diagnosis Date Noted   Abnormal gait 07/27/2023   Hypertension 07/10/2023   Blister of left leg 05/24/2023   Acute blood loss anemia 03/27/2023   Phantom pain after amputation of lower extremity (HCC) 03/27/2023   Adjustment disorder with mixed anxiety and depressed mood 03/15/2023   Above knee amputation of right lower extremity (HCC) 03/13/2023   CKD (chronic kidney disease) stage 3, GFR 30-59 ml/min (HCC) 03/13/2023   Cellulitis of right lower extremity 03/09/2023   Fever 03/09/2023   Non-traumatic rhabdomyolysis 03/09/2023   Bandemia 03/09/2023   Acute kidney injury superimposed on chronic kidney disease (HCC) 03/05/2023  Hyperkalemia 03/05/2023   Normocytic anemia 03/05/2023   History of prostate cancer 03/05/2023   Essential hypertension 03/05/2023   Metabolic acidosis, increased anion gap 03/05/2023   Critical limb ischemia of right lower extremity (HCC) 12/12/2022   Malignant neoplasm of prostate (HCC) 03/18/2019    PCP: Rica Records FNP  REFERRING PROVIDER: Genice Rouge, MD  REFERRING DIAG: [phone] (ICD-10-CM) - S/P AKA (above knee amputation), right (HCC)  THERAPY DIAG:  Other abnormalities of gait and  mobility  Muscle weakness (generalized)  S/P AKA (above knee amputation), right (HCC)  Rationale for Evaluation and Treatment: Rehabilitation  ONSET DATE: 03/09/23  SUBJECTIVE:   SUBJECTIVE STATEMENT:  Today: Patient states that his prosthetic leg began to rotate internally again. Does not have appt with [facility] until October.       Eval: Patient states he did a lot of therapy at the hospital following amputation. He has been using RW to get around. He got his prosthetic July 19th. Patient is frustrated with his fatigue, gait speed, dressing difficulties. Is mostly just walking around in the house at this point. Has to take breaks with walking in the house due to fatiuge. He enjoys baking cakes. Has to encourage himself to use leg more. Has not used leg 8 hours yet. Had some wounds prior to amputation. Has some phantom pain.   PERTINENT HISTORY: R AKA 03/09/23, admitted CIR 03/13/23-03/26/23, HTN, HLD, hx prostate cancer PAIN:  Are you having pain? Yes: NPRS scale: 4/10 Pain location: R quad region Pain description: sharp Aggravating factors: constant Relieving factors: movement, meds  PRECAUTIONS: Fall  WEIGHT BEARING RESTRICTIONS: No  FALLS:  Has patient fallen in last 6 months? No  Patient Survey(08/31/23) Lower Extremity Functional Scale (LEFS)  14 / 80 = 17.5 %    OCCUPATION: Out on leave  PLOF: Independent  PATIENT GOALS: to be walking better like he isn't using a prosthetic     OBJECTIVE: (objective measures from initial evaluation unless otherwise dated)   COGNITION: Overall cognitive status: Within functional limits for tasks assessed     SENSATION: WFL   POSTURE: No Significant postural limitations  PALPATION: No significant limitations  LOWER EXTREMITY ROM: slightly decreased R hip extension ROM    LOWER EXTREMITY MMT:  MMT Right eval Left eval  Hip flexion 4 (limited range in seated) 4+  Hip extension 3- 3+  Hip abduction 3- 4-  Hip  adduction    Hip internal rotation    Hip external rotation    Knee flexion  5  Knee extension  5  Ankle dorsiflexion  5  Ankle plantarflexion    Ankle inversion    Ankle eversion     (Blank rows = not tested) *= pain/symptoms    FUNCTIONAL TESTS:  5 times sit to stand: 53.94 seconds with UE support on chair and RW, relies LLE Timed up and go (TUG): 2: 18 with RW (includes time to release air from prosthetic upon standing) 2 minute walk test: 24 feet with RW  GAIT: Distance walked: 24 feet Assistive device utilized: Walker - 2 wheeled Level of assistance: SBA Comments: , step too pattern *08/16/23: Patient ambulates using a step to gait pattern in rolling walker. Minimal increase in 1) lateral heel whip and 2) circumduction on right lower extremity during swing phase   TODAY'S TREATMENT:  DATE:  09/03/23 Prosthetic Training x15' -Donning socket into ER/abduction  Therapeutic Activity x25' -S2S in // bars with wt shifts over left lower extremity then right lower extremity, UE assist   08/31/23 Gait Training x40 -2 pt Step to pattern in // bars -2 pt step to pattern using large base with PT moderate assist   08/28/23 Therapeutic exercise x43min -Seated bilateral hip marches 2x10 bilateral with 2#  -Standing bilateral hip abduction, 2x10 with 2# Therapeutic Activity x72min -Step to with wt shifts + large based cane in // bars   08/23/23 Therapeutic exercise x45min  - Seated bilateral abduction with B theraband 2x10 Therapeutic Activity x25 -S2S with wt shift over right lower extremity in // bars, PT contact guard assist 3x5 -Standing wt shift from left to right lower extremity in // bars, PT CGA Gait Training x82min  -Clinic ambulation with wt shift to right lower extremity   PATIENT EDUCATION:  Education details: Patient educated on exam  findings, POC, scope of PT, HEP, and prone lying to promote hip extension, skin checks. Person educated: Patient Education method: Explanation, Demonstration, and Handouts Education comprehension: verbalized understanding, returned demonstration, verbal cues required, and tactile cues required  HOME EXERCISE PROGRAM: Access Code: [email or web address] URL: [email or web address]  Date: 08/09/2023 - Prone on Elbows Stretch  - 3 x daily - 7 x weekly - 10-20 minutes hold - Standing March with Counter Support  - 3 x daily - 7 x weekly - 2 sets - 10 reps - Standing Hip Abduction with Counter Support  - 3 x daily - 7 x weekly - 2 sets - 10 reps - Standing Hip Extension with Counter Support  - 3 x daily - 7 x weekly - 2 sets - 10 reps  ASSESSMENT:  CLINICAL IMPRESSION: Patient arrives with RW today; as per subjective, his prosthetic is internally rotated at the foot today. PT reviewed donning/doffing with patient with focus on keeping limb into abduction/ ER. This resolved the issue. PT then proceeded to work on [employer] in // bars.   EvaL: Patient a 72 y.o. y.o. male who was seen today for physical therapy evaluation and treatment for s/p R AKA. Patient presents with deficits in LE strength, ROM, endurance, activity tolerance, gait, balance, and functional mobility with ADL. Patient is having to modify and restrict ADL as indicated by outcome measure score as well as subjective information and objective measures which is affecting overall participation. Patient will benefit from skilled physical therapy in order to improve function and reduce impairment.  OBJECTIVE IMPAIRMENTS: Abnormal gait, decreased activity tolerance, decreased balance, decreased mobility, difficulty walking, decreased ROM, decreased strength, increased muscle spasms, impaired flexibility, and improper body mechanics.   ACTIVITY LIMITATIONS: carrying, lifting, bending, standing, squatting, stairs, transfers, bathing,  locomotion level, and caring for others  PARTICIPATION LIMITATIONS: meal prep, cleaning, laundry, shopping, community activity, and yard work  PERSONAL FACTORS: Fitness and 3+ comorbidities: R AKA, HTN, HLD  are also affecting patient's functional outcome.   REHAB POTENTIAL: Good  CLINICAL DECISION MAKING: Stable/uncomplicated  EVALUATION COMPLEXITY: Low   GOALS: Goals reviewed with patient? Yes  SHORT TERM GOALS: Target date: 08/30/2023    Patient will be independent with HEP in order to improve functional outcomes. Baseline: Goal status: INITIAL  2.  Patient will report at least 25% improvement in symptoms for improved quality of life. Baseline: Goal status: INITIAL    LONG TERM GOALS: Target date: 09/20/2023    Patient will report at least 75% improvement in symptoms for  improved quality of life. Baseline:  Goal status: INITIAL  2.   Patient will demonstrate grade of 5/5 MMT grade in all tested musculature as evidence of improved strength to assist with stair ambulation and gait.   Baseline: see above Goal status: INITIAL  3.  Patient will be able to ambulate at least 100 feet in  with LRAD in order to demonstrate improved gait speed for community ambulation. Baseline: 24 feet with RW Goal status: INITIAL  4.  Patient will be able to complete TUG in under 24.3 seconds in order to demonstrate improved mobility to reduce risk for falls. Baseline: 2:18 with RW (includes time to release air from prosthetic upon standing) Goal status: INITIAL  5.  Patient will be able to complete 5x STS in under 20 seconds in order to demo improved functional strength. Baseline: 53.94 seconds with UE support on chair and RW, relies LLE Goal status: INITIAL     PLAN:  PT FREQUENCY: 2x/week  PT DURATION: 6 weeks  PLANNED INTERVENTIONS: Therapeutic exercises, Therapeutic activity, Neuromuscular re-education, Balance training, Gait training, Patient/Family education, Joint  manipulation, Joint mobilization, Stair training, Orthotic/Fit training, DME instructions, Aquatic Therapy, Dry Needling, Electrical stimulation, Spinal manipulation, Spinal mobilization, Cryotherapy, Moist heat, Compression bandaging, scar mobilization, Splintting, Taping, Traction, Ultrasound, Ionotophoresis 4mg /ml Dexamethasone, and Manual therapy  PLAN FOR NEXT SESSION: hip mobility and hip strength, functional strength, balance and gait training, endurance  Seymour Bars PT, DPT

## 2023-09-06 ENCOUNTER — Ambulatory Visit (HOSPITAL_COMMUNITY): Payer: Medicare Other

## 2023-09-06 DIAGNOSIS — R2689 Other abnormalities of gait and mobility: Secondary | ICD-10-CM

## 2023-09-06 DIAGNOSIS — Z89611 Acquired absence of right leg above knee: Secondary | ICD-10-CM | POA: Diagnosis not present

## 2023-09-06 DIAGNOSIS — M6281 Muscle weakness (generalized): Secondary | ICD-10-CM | POA: Diagnosis not present

## 2023-09-06 NOTE — Therapy (Signed)
OUTPATIENT PHYSICAL THERAPY LOWER EXTREMITY TREATMENT   Patient Name: [name] MRN: [id number] DOB:20-Apr-1951, 72 y.o., male Today's Date: 09/06/2023  END OF SESSION:  PT End of Session - 09/06/23 5784     Visit Number 9    Number of Visits 12    Date for PT Re-Evaluation 09/20/23    Authorization Type [employer]    Authorization Time Period Requested    Authorization - Number of Visits 12    Progress Note Due on Visit 10    PT Start Time 0818    PT Stop Time 0859    PT Time Calculation (min) 41 min    Activity Tolerance Patient tolerated treatment well    Behavior During Therapy [facility] for tasks assessed/performed                  Past Medical History:  Diagnosis Date   Family history of metabolic acidosis with increased anion gap 03/05/2023   GERD (gastroesophageal reflux disease)    History of kidney stones    Hyperlipidemia    Hypertension    Prostate cancer [facility]) 2011   radiation june 2020   Umbilical hernia    Wears dentures    full   Wears glasses    Wears partial dentures    lower   Past Surgical History:  Procedure Laterality Date   ABDOMINAL AORTOGRAM W/LOWER EXTREMITY N/A 11/20/2022   Procedure: ABDOMINAL AORTOGRAM W/LOWER EXTREMITY;  Surgeon: Maeola Harman, MD;  Location: [facility] INVASIVE CV LAB;  Service: Cardiovascular;  Laterality: N/A;   ABDOMINAL AORTOGRAM W/LOWER EXTREMITY N/A 03/05/2023   Procedure: ABDOMINAL AORTOGRAM W/LOWER EXTREMITY;  Surgeon: Maeola Harman, MD;  Location: [facility] INVASIVE CV LAB;  Service: Cardiovascular;  Laterality: N/A;   AMPUTATION Right 03/09/2023   Procedure: AMPUTATION ABOVE KNEE;  Surgeon: Maeola Harman, MD;  Location: [facility] OR;  Service: Vascular;  Laterality: Right;   COLONOSCOPY N/A 08/25/2020   Procedure: COLONOSCOPY;  Surgeon: Corbin Ade, MD;  [facility];  Service: Endoscopy;  Laterality: N/A;  9:30   COLONOSCOPY     CYSTOSCOPY WITH LITHOLAPAXY N/A 05/18/2021    Procedure: CYSTOSCOPY WITH LITHOLAPAXY;  Surgeon: Rene Paci, MD;  Location: [facility];  Service: Urology;  Laterality: N/A;  ONLY NEEDS  30 MIN   FEMORAL-TIBIAL BYPASS GRAFT Right 12/12/2022   Procedure: RIGHT COMMON FEMORAL-POSTERIOR TIBIAL ARTERY BYPASS WITH VEIN HARVESTING OF THE GREATER SAPHENOUS VEIN AND FEMORAL ENDARTERECTOMY WITH COMPOSITE GRAFT;  Surgeon: Maeola Harman, MD;  Location: [facility] OR;  Service: Vascular;  Laterality: Right;   POLYPECTOMY  08/25/2020   Procedure: POLYPECTOMY;  Surgeon: Corbin Ade, MD;  Location: [facility];  Service: Endoscopy;;   ROBOT ASSISTED LAPAROSCOPIC RADICAL PROSTATECTOMY  2011   TONSILLECTOMY  age 65   and adenoids removed   Patient Active Problem List   Diagnosis Date Noted   Abnormal gait 07/27/2023   Hypertension 07/10/2023   Blister of left leg 05/24/2023   Acute blood loss anemia 03/27/2023   Phantom pain after amputation of lower extremity (HCC) 03/27/2023   Adjustment disorder with mixed anxiety and depressed mood 03/15/2023   Above knee amputation of right lower extremity (HCC) 03/13/2023   CKD (chronic kidney disease) stage 3, GFR 30-59 ml/min (HCC) 03/13/2023   Cellulitis of right lower extremity 03/09/2023   Fever 03/09/2023   Non-traumatic rhabdomyolysis 03/09/2023   Bandemia 03/09/2023   Acute kidney injury superimposed on chronic kidney disease (HCC) 03/05/2023  Hyperkalemia 03/05/2023   Normocytic anemia 03/05/2023   History of prostate cancer 03/05/2023   Essential hypertension 03/05/2023   Metabolic acidosis, increased anion gap 03/05/2023   Critical limb ischemia of right lower extremity (HCC) 12/12/2022   Malignant neoplasm of prostate (HCC) 03/18/2019    PCP: Rica Records FNP  REFERRING PROVIDER: Genice Rouge, MD  REFERRING DIAG: [phone] (ICD-10-CM) - S/P AKA (above knee amputation), right (HCC)  THERAPY DIAG:  Other abnormalities of gait and  mobility  Muscle weakness (generalized)  S/P AKA (above knee amputation), right (HCC)  Rationale for Evaluation and Treatment: Rehabilitation  ONSET DATE: 03/09/23  SUBJECTIVE:   SUBJECTIVE STATEMENT:  Today: Patient states that his bilateral shoulders have been sore       Eval: Patient states he did a lot of therapy at the hospital following amputation. He has been using RW to get around. He got his prosthetic July 19th. Patient is frustrated with his fatigue, gait speed, dressing difficulties. Is mostly just walking around in the house at this point. Has to take breaks with walking in the house due to fatiuge. He enjoys baking cakes. Has to encourage himself to use leg more. Has not used leg 8 hours yet. Had some wounds prior to amputation. Has some phantom pain.   PERTINENT HISTORY: R AKA 03/09/23, admitted CIR 03/13/23-03/26/23, HTN, HLD, hx prostate cancer PAIN:  Are you having pain? Yes: NPRS scale: 2/10 Pain location: R quad region Pain description: sharp Aggravating factors: constant Relieving factors: movement, meds  PRECAUTIONS: Fall  WEIGHT BEARING RESTRICTIONS: No  FALLS:  Has patient fallen in last 6 months? No  Patient Survey(08/31/23) Lower Extremity Functional Scale (LEFS)  14 / 80 = 17.5 %    OCCUPATION: Out on leave  PLOF: Independent  PATIENT GOALS: to be walking better like he isn't using a prosthetic     OBJECTIVE: (objective measures from initial evaluation unless otherwise dated)   COGNITION: Overall cognitive status: Within functional limits for tasks assessed     SENSATION: WFL   POSTURE: No Significant postural limitations  PALPATION: No significant limitations  LOWER EXTREMITY ROM: slightly decreased R hip extension ROM    LOWER EXTREMITY MMT:  MMT Right eval Left eval  Hip flexion 4 (limited range in seated) 4+  Hip extension 3- 3+  Hip abduction 3- 4-  Hip adduction    Hip internal rotation    Hip external rotation     Knee flexion  5  Knee extension  5  Ankle dorsiflexion  5  Ankle plantarflexion    Ankle inversion    Ankle eversion     (Blank rows = not tested) *= pain/symptoms    FUNCTIONAL TESTS:  5 times sit to stand: 53.94 seconds with UE support on chair and RW, relies LLE Timed up and go (TUG): 2: 18 with RW (includes time to release air from prosthetic upon standing) 2 minute walk test: 24 feet with RW  GAIT: Distance walked: 24 feet Assistive device utilized: Walker - 2 wheeled Level of assistance: SBA Comments: , step too pattern *08/16/23: Patient ambulates using a step to gait pattern in rolling walker. Minimal increase in 1) lateral heel whip and 2) circumduction on right lower extremity during swing phase   TODAY'S TREATMENT:  DATE:  09/06/23 Therapeutic exercise x12' -Seated pulleys warm up -Seated hip marches with 2# 2x10 Therapeutic Activity x28' -Standing marches on 4" step in // bars -forward wt shifts on 4" step in // bars     09/03/23 Prosthetic Training x15' -Donning socket into ER/abduction  Therapeutic Activity x25' -S2S in // bars with wt shifts over left lower extremity then right lower extremity, UE assist   08/31/23 Gait Training x40 -2 pt Step to pattern in // bars -2 pt step to pattern using large base with PT moderate assist   08/28/23 Therapeutic exercise x40min -Seated bilateral hip marches 2x10 bilateral with 2#  -Standing bilateral hip abduction, 2x10 with 2# Therapeutic Activity x26min -Step to with wt shifts + large based cane in // bars   08/23/23 Therapeutic exercise x76min  - Seated bilateral abduction with B theraband 2x10 Therapeutic Activity x25 -S2S with wt shift over right lower extremity in // bars, PT contact guard assist 3x5 -Standing wt shift from left to right lower extremity in // bars, PT CGA Gait  Training x68min  -Clinic ambulation with wt shift to right lower extremity   PATIENT EDUCATION:  Education details: Patient educated on exam findings, POC, scope of PT, HEP, and prone lying to promote hip extension, skin checks. Person educated: Patient Education method: Explanation, Demonstration, and Handouts Education comprehension: verbalized understanding, returned demonstration, verbal cues required, and tactile cues required  HOME EXERCISE PROGRAM: Access Code: [email or web address] URL: [email or web address]  Date: 08/09/2023 - Prone on Elbows Stretch  - 3 x daily - 7 x weekly - 10-20 minutes hold - Standing March with Counter Support  - 3 x daily - 7 x weekly - 2 sets - 10 reps - Standing Hip Abduction with Counter Support  - 3 x daily - 7 x weekly - 2 sets - 10 reps - Standing Hip Extension with Counter Support  - 3 x daily - 7 x weekly - 2 sets - 10 reps  ASSESSMENT:  CLINICAL IMPRESSION: Patient received in waiting room + rolling walker today. PT session focused on wt shifts in // bars because patient heavily relies on UE assist through his rolling walker which can be increasing his shoulder pain. Patient with Mod fatigue today; requires intermittent rest breaks in between Therapeutic Activity.  Patient will continue to benefit from PT to improve overall functional mobility.   EvaL: Patient a 72 y.o. y.o. male who was seen today for physical therapy evaluation and treatment for s/p R AKA. Patient presents with deficits in LE strength, ROM, endurance, activity tolerance, gait, balance, and functional mobility with ADL. Patient is having to modify and restrict ADL as indicated by outcome measure score as well as subjective information and objective measures which is affecting overall participation. Patient will benefit from skilled physical therapy in order to improve function and reduce impairment.  OBJECTIVE IMPAIRMENTS: Abnormal gait, decreased activity tolerance, decreased  balance, decreased mobility, difficulty walking, decreased ROM, decreased strength, increased muscle spasms, impaired flexibility, and improper body mechanics.   ACTIVITY LIMITATIONS: carrying, lifting, bending, standing, squatting, stairs, transfers, bathing, locomotion level, and caring for others  PARTICIPATION LIMITATIONS: meal prep, cleaning, laundry, shopping, community activity, and yard work  PERSONAL FACTORS: Fitness and 3+ comorbidities: R AKA, HTN, HLD  are also affecting patient's functional outcome.   REHAB POTENTIAL: Good  CLINICAL DECISION MAKING: Stable/uncomplicated  EVALUATION COMPLEXITY: Low   GOALS: Goals reviewed with patient? Yes  SHORT TERM GOALS: Target date: 08/30/2023    Patient will be independent  with HEP in order to improve functional outcomes. Baseline: Goal status: INITIAL  2.  Patient will report at least 25% improvement in symptoms for improved quality of life. Baseline: Goal status: INITIAL    LONG TERM GOALS: Target date: 09/20/2023    Patient will report at least 75% improvement in symptoms for improved quality of life. Baseline:  Goal status: INITIAL  2.   Patient will demonstrate grade of 5/5 MMT grade in all tested musculature as evidence of improved strength to assist with stair ambulation and gait.   Baseline: see above Goal status: INITIAL  3.  Patient will be able to ambulate at least 100 feet in  with LRAD in order to demonstrate improved gait speed for community ambulation. Baseline: 24 feet with RW Goal status: INITIAL  4.  Patient will be able to complete TUG in under 24.3 seconds in order to demonstrate improved mobility to reduce risk for falls. Baseline: 2:18 with RW (includes time to release air from prosthetic upon standing) Goal status: INITIAL  5.  Patient will be able to complete 5x STS in under 20 seconds in order to demo improved functional strength. Baseline: 53.94 seconds with UE support on chair and RW,  relies LLE Goal status: INITIAL     PLAN:  PT FREQUENCY: 2x/week  PT DURATION: 6 weeks  PLANNED INTERVENTIONS: Therapeutic exercises, Therapeutic activity, Neuromuscular re-education, Balance training, Gait training, Patient/Family education, Joint manipulation, Joint mobilization, Stair training, Orthotic/Fit training, DME instructions, Aquatic Therapy, Dry Needling, Electrical stimulation, Spinal manipulation, Spinal mobilization, Cryotherapy, Moist heat, Compression bandaging, scar mobilization, Splintting, Taping, Traction, Ultrasound, Ionotophoresis 4mg /ml Dexamethasone, and Manual therapy  PLAN FOR NEXT SESSION: hip mobility and hip strength, functional strength, balance and gait training, endurance   Seymour Bars PT, DPT

## 2023-09-10 ENCOUNTER — Ambulatory Visit (HOSPITAL_COMMUNITY): Payer: Medicare Other

## 2023-09-10 DIAGNOSIS — R2689 Other abnormalities of gait and mobility: Secondary | ICD-10-CM | POA: Diagnosis not present

## 2023-09-10 DIAGNOSIS — Z89611 Acquired absence of right leg above knee: Secondary | ICD-10-CM

## 2023-09-10 DIAGNOSIS — M6281 Muscle weakness (generalized): Secondary | ICD-10-CM | POA: Diagnosis not present

## 2023-09-10 NOTE — Therapy (Signed)
OUTPATIENT PHYSICAL THERAPY LOWER EXTREMITY TREATMENT   Patient Name: [name] MRN: [id number] DOB:[birth year], 72 y.o., male Today's Date: 09/10/2023  END OF SESSION:  PT End of Session - 09/10/23 0809     Visit Number 10    Number of Visits 12    Date for PT Re-Evaluation 09/20/23    Authorization Type [employer]    Authorization Time Period Requested 12 more visits (9/26-11/11/24)    Authorization - Number of Visits 12    Progress Note Due on Visit 20    PT Start Time 0800    PT Stop Time 0840    PT Time Calculation (min) 40 min    Activity Tolerance Patient tolerated treatment well    Behavior During Therapy [facility] for tasks assessed/performed                    Past Medical History:  Diagnosis Date   Family history of metabolic acidosis with increased anion gap 03/05/2023   GERD (gastroesophageal reflux disease)    History of kidney stones    Hyperlipidemia    Hypertension    Prostate cancer [facility]) 2011   radiation june 2020   Umbilical hernia    Wears dentures    full   Wears glasses    Wears partial dentures    lower   Past Surgical History:  Procedure Laterality Date   ABDOMINAL AORTOGRAM W/LOWER EXTREMITY N/A 11/20/2022   Procedure: ABDOMINAL AORTOGRAM W/LOWER EXTREMITY;  Surgeon: Maeola Harman, MD;  Location: [facility] INVASIVE CV LAB;  Service: Cardiovascular;  Laterality: N/A;   ABDOMINAL AORTOGRAM W/LOWER EXTREMITY N/A 03/05/2023   Procedure: ABDOMINAL AORTOGRAM W/LOWER EXTREMITY;  Surgeon: Maeola Harman, MD;  Location: [facility] INVASIVE CV LAB;  Service: Cardiovascular;  Laterality: N/A;   AMPUTATION Right 03/09/2023   Procedure: AMPUTATION ABOVE KNEE;  Surgeon: Maeola Harman, MD;  Location: [facility] OR;  Service: Vascular;  Laterality: Right;   COLONOSCOPY N/A 08/25/2020   Procedure: COLONOSCOPY;  Surgeon: Corbin Ade, MD;  [facility];  Service: Endoscopy;  Laterality: N/A;  9:30   COLONOSCOPY      CYSTOSCOPY WITH LITHOLAPAXY N/A 05/18/2021   Procedure: CYSTOSCOPY WITH LITHOLAPAXY;  Surgeon: Rene Paci, MD;  Location: [facility];  Service: Urology;  Laterality: N/A;  ONLY NEEDS  30 MIN   FEMORAL-TIBIAL BYPASS GRAFT Right 12/12/2022   Procedure: RIGHT COMMON FEMORAL-POSTERIOR TIBIAL ARTERY BYPASS WITH VEIN HARVESTING OF THE GREATER SAPHENOUS VEIN AND FEMORAL ENDARTERECTOMY WITH COMPOSITE GRAFT;  Surgeon: Maeola Harman, MD;  Location: [facility] OR;  Service: Vascular;  Laterality: Right;   POLYPECTOMY  08/25/2020   Procedure: POLYPECTOMY;  Surgeon: Corbin Ade, MD;  Location: [facility];  Service: Endoscopy;;   ROBOT ASSISTED LAPAROSCOPIC RADICAL PROSTATECTOMY  2011   TONSILLECTOMY  age 61   and adenoids removed   Patient Active Problem List   Diagnosis Date Noted   Abnormal gait 07/27/2023   Hypertension 07/10/2023   Blister of left leg 05/24/2023   Acute blood loss anemia 03/27/2023   Phantom pain after amputation of lower extremity (HCC) 03/27/2023   Adjustment disorder with mixed anxiety and depressed mood 03/15/2023   Above knee amputation of right lower extremity (HCC) 03/13/2023   CKD (chronic kidney disease) stage 3, GFR 30-59 ml/min (HCC) 03/13/2023   Cellulitis of right lower extremity 03/09/2023   Fever 03/09/2023   Non-traumatic rhabdomyolysis 03/09/2023   Bandemia 03/09/2023   Acute kidney injury superimposed  on chronic kidney disease (HCC) 03/05/2023   Hyperkalemia 03/05/2023   Normocytic anemia 03/05/2023   History of prostate cancer 03/05/2023   Essential hypertension 03/05/2023   Metabolic acidosis, increased anion gap 03/05/2023   Critical limb ischemia of right lower extremity (HCC) 12/12/2022   Malignant neoplasm of prostate (HCC) 03/18/2019    PCP: Rica Records FNP  REFERRING PROVIDER: Genice Rouge, MD  REFERRING DIAG: [phone] (ICD-10-CM) - S/P AKA (above knee amputation), right (HCC)  THERAPY DIAG:   Other abnormalities of gait and mobility  Muscle weakness (generalized)  S/P AKA (above knee amputation), right (HCC)  Rationale for Evaluation and Treatment: Rehabilitation  ONSET DATE: 03/09/23  SUBJECTIVE:   SUBJECTIVE STATEMENT:  Progress Note 09/10/23: Patient has recently been to [facility] to have prosthetic was adjusted; this lasted 2-3 days until the leg began to rotate inwards again. Patient reports still have issues with general mobility and phantom limb pain. The phantom limb pain can get very sharp/intense. Patient has PCP follow up soon.    Eval: Patient states he did a lot of therapy at the hospital following amputation. He has been using RW to get around. He got his prosthetic July 19th. Patient is frustrated with his fatigue, gait speed, dressing difficulties. Is mostly just walking around in the house at this point. Has to take breaks with walking in the house due to fatiuge. He enjoys baking cakes. Has to encourage himself to use leg more. Has not used leg 8 hours yet. Had some wounds prior to amputation. Has some phantom pain.   PERTINENT HISTORY: R AKA 03/09/23, admitted [facility] 03/13/23-03/26/23, HTN, HLD, hx prostate cancer PAIN:  Are you having pain? Yes: NPRS scale: 7/10 Pain location: R quad region Pain description: sharp Aggravating factors: constant Relieving factors: movement, meds  PRECAUTIONS: Fall  WEIGHT BEARING RESTRICTIONS: No  FALLS:  Has patient fallen in last 6 months? No  Patient Survey  Lower Extremity Functional Scale (LEFS)  14 / 80 = 17.5 % on 09/10/23    OCCUPATION: Out on leave  PLOF: Independent  PATIENT GOALS: to be walking better like he isn't using a prosthetic     OBJECTIVE: (objective measures from initial evaluation unless otherwise dated)   COGNITION: Overall cognitive status: Within functional limits for tasks assessed     SENSATION: WFL   POSTURE: No Significant postural limitations  PALPATION: No  significant limitations  LOWER EXTREMITY ROM: slightly decreased R hip extension ROM    LOWER EXTREMITY MMT:  MMT Right eval Left eval Right  9/16 Left 9/16  Hip flexion 4 (limited range in seated) 4+ 4 4+  Hip extension 3- 3+ 3 3+  Hip abduction 3- 4- 3 4-  Hip adduction      Hip internal rotation      Hip external rotation      Knee flexion  5  5  Knee extension  5  5  Ankle dorsiflexion  5  5  Ankle plantarflexion      Ankle inversion      Ankle eversion       (Blank rows = not tested) *= pain/symptoms    FUNCTIONAL TESTS:  5 times sit to stand: 53.94 seconds with UE support on chair and RW, relies LLE Timed up and go (TUG): 2: 18 with RW (includes time to release air from prosthetic upon standing) 2 minute walk test: 24 feet with RW  09/09/13  5 times sit to stand: 35s seconds with UE support  on chair and RW, Timed up and go (TUG): 80m22s with rolling walker; includes time to let air out of prosthetic 2 minute walk test:  43 feet with RW  GAIT: Distance walked: 43 feet Assistive device utilized: Walker - 2 wheeled Level of assistance: SBA Comments: , step to pattern *08/16/23: Patient ambulates using a step to gait pattern in rolling walker. Minimal increase in 1) lateral heel whip and 2) circumduction on right lower extremity during swing phase   TODAY'S TREATMENT:                                                                                                                              DATE:  09/10/23 PT Progress note- objective testing/measures    09/06/23 Therapeutic exercise x12' -Seated pulleys warm up -Seated hip marches with 2# 2x10 Therapeutic Activity x28' -Standing marches on 4" step in // bars -forward wt shifts on 4" step in // bars     09/03/23 Prosthetic Training x15' -Donning socket into ER/abduction  Therapeutic Activity x25' -S2S in // bars with wt shifts over left lower extremity then right lower extremity, UE assist    08/31/23 Gait Training x40 -2 pt Step to pattern in // bars -2 pt step to pattern using large base with PT moderate assist    PATIENT EDUCATION:  Education details: Patient educated on exam findings, POC, scope of PT, HEP, and prone lying to promote hip extension, skin checks. Person educated: Patient Education method: Explanation, Demonstration, and Handouts Education comprehension: verbalized understanding, returned demonstration, verbal cues required, and tactile cues required  HOME EXERCISE PROGRAM: Access Code: [email or web address] URL: [email or web address]  Date: 08/09/2023 - Prone on Elbows Stretch  - 3 x daily - 7 x weekly - 10-20 minutes hold - Standing March with Counter Support  - 3 x daily - 7 x weekly - 2 sets - 10 reps - Standing Hip Abduction with Counter Support  - 3 x daily - 7 x weekly - 2 sets - 10 reps - Standing Hip Extension with Counter Support  - 3 x daily - 7 x weekly - 2 sets - 10 reps  ASSESSMENT:  CLINICAL IMPRESSION:     09/10/23: Patient a 72 y.o. y.o. male who was seen today for physical therapy progress note for s/p R AKA. Patient has shown improvements in overall TUG speed, distance, and 5TSTS measures by ~50%. Patient still presents with deficits in LE strength, ROM, endurance, activity tolerance, gait, balance, and functional mobility with ADL. Patient is having to modify and restrict ADL as indicated by outcome measure score as well as subjective information and objective measures which is affecting overall participation. Patient will benefit from skilled physical therapy in order to improve function and reduce impairment.  OBJECTIVE IMPAIRMENTS: Abnormal gait, decreased activity tolerance, decreased balance, decreased mobility, difficulty walking, decreased ROM, decreased strength, increased muscle spasms, impaired flexibility, and improper body mechanics.   ACTIVITY LIMITATIONS: carrying, lifting, bending, standing, squatting,  stairs,  transfers, bathing, locomotion level, and caring for others  PARTICIPATION LIMITATIONS: meal prep, cleaning, laundry, shopping, community activity, and yard work  PERSONAL FACTORS: Fitness and 3+ comorbidities: R AKA, HTN, HLD  are also affecting patient's functional outcome.   REHAB POTENTIAL: Good  CLINICAL DECISION MAKING: Stable/uncomplicated  EVALUATION COMPLEXITY: Low   GOALS: Goals reviewed with patient? Yes  SHORT TERM GOALS: Target date: 08/30/2023    Patient will be independent with HEP in order to improve functional outcomes. Baseline:  Goal status: in progress 09/10/23  2.  Patient will report at least 25% improvement in symptoms for improved quality of life. Baseline:  Goal status: in progress 09/10/23    LONG TERM GOALS: Target date: 09/20/2023    Patient will report at least 75% improvement in symptoms for improved quality of life. Baseline:  Goal status: in progress 09/10/23  2.   Patient will demonstrate grade of 5/5 MMT grade in all tested musculature as evidence of improved strength to assist with stair ambulation and gait.   Baseline: see above Goal status: in progress 09/10/23  3.  Patient will be able to ambulate at least 100 feet in  with LRAD in order to demonstrate improved gait speed for community ambulation. Baseline: 24 feet with RW Goal status: in progress 09/10/23  4.  Patient will be able to complete TUG in under 24.3 seconds in order to demonstrate improved mobility to reduce risk for falls. Baseline: 2:18 with RW (includes time to release air from prosthetic upon standing) Goal status: in progress 09/10/23  5.  Patient will be able to complete 5x STS in under 20 seconds in order to demo improved functional strength. Baseline: 53.94 seconds with UE support on chair and RW, relies LLE Goal status: in progress 09/10/23     PLAN:  PT FREQUENCY: 2x/week  PT DURATION: 6 weeks  PLANNED INTERVENTIONS: Therapeutic exercises, Therapeutic  activity, Neuromuscular re-education, Balance training, Gait training, Patient/Family education, Joint manipulation, Joint mobilization, Stair training, Orthotic/Fit training, DME instructions, Aquatic Therapy, Dry Needling, Electrical stimulation, Spinal manipulation, Spinal mobilization, Cryotherapy, Moist heat, Compression bandaging, scar mobilization, Splintting, Taping, Traction, Ultrasound, Ionotophoresis 4mg /ml Dexamethasone, and Manual therapy  PLAN FOR NEXT SESSION: hip mobility and hip strength, functional strength, balance and gait training, endurance   Seymour Bars PT, DPT

## 2023-09-13 ENCOUNTER — Ambulatory Visit (HOSPITAL_COMMUNITY): Payer: Medicare Other

## 2023-09-13 DIAGNOSIS — Z89611 Acquired absence of right leg above knee: Secondary | ICD-10-CM

## 2023-09-13 DIAGNOSIS — M6281 Muscle weakness (generalized): Secondary | ICD-10-CM | POA: Diagnosis not present

## 2023-09-13 DIAGNOSIS — R2689 Other abnormalities of gait and mobility: Secondary | ICD-10-CM | POA: Diagnosis not present

## 2023-09-13 NOTE — Therapy (Signed)
OUTPATIENT PHYSICAL THERAPY LOWER EXTREMITY TREATMENT   Patient Name: [name] MRN: [id number] DOB:1951/11/19, 72 y.o., male Today's Date: 09/13/2023  END OF SESSION:  PT End of Session - 09/13/23 0825     Visit Number 11    Number of Visits 16    Date for PT Re-Evaluation 09/20/23    Authorization Type [employer]    Authorization Time Period 9/1-10/13/24    Authorization - Visit Number 6    Authorization - Number of Visits 12    Progress Note Due on Visit 16    PT Start Time 0800    PT Stop Time 0840    PT Time Calculation (min) 40 min    Activity Tolerance Patient tolerated treatment well    Behavior During Therapy [facility] for tasks assessed/performed                  Past Medical History:  Diagnosis Date   Family history of metabolic acidosis with increased anion gap 03/05/2023   GERD (gastroesophageal reflux disease)    History of kidney stones    Hyperlipidemia    Hypertension    Prostate cancer [facility]) 2011   radiation june 2020   Umbilical hernia    Wears dentures    full   Wears glasses    Wears partial dentures    lower   Past Surgical History:  Procedure Laterality Date   ABDOMINAL AORTOGRAM W/LOWER EXTREMITY N/A 11/20/2022   Procedure: ABDOMINAL AORTOGRAM W/LOWER EXTREMITY;  Surgeon: Maeola Harman, MD;  Location: [facility] INVASIVE CV LAB;  Service: Cardiovascular;  Laterality: N/A;   ABDOMINAL AORTOGRAM W/LOWER EXTREMITY N/A 03/05/2023   Procedure: ABDOMINAL AORTOGRAM W/LOWER EXTREMITY;  Surgeon: Maeola Harman, MD;  Location: [facility] INVASIVE CV LAB;  Service: Cardiovascular;  Laterality: N/A;   AMPUTATION Right 03/09/2023   Procedure: AMPUTATION ABOVE KNEE;  Surgeon: Maeola Harman, MD;  Location: [facility] OR;  Service: Vascular;  Laterality: Right;   COLONOSCOPY N/A 08/25/2020   Procedure: COLONOSCOPY;  Surgeon: Corbin Ade, MD;  [facility];  Service: Endoscopy;  Laterality: N/A;  9:30   COLONOSCOPY      CYSTOSCOPY WITH LITHOLAPAXY N/A 05/18/2021   Procedure: CYSTOSCOPY WITH LITHOLAPAXY;  Surgeon: Rene Paci, MD;  Location: [facility];  Service: Urology;  Laterality: N/A;  ONLY NEEDS  30 MIN   FEMORAL-TIBIAL BYPASS GRAFT Right 12/12/2022   Procedure: RIGHT COMMON FEMORAL-POSTERIOR TIBIAL ARTERY BYPASS WITH VEIN HARVESTING OF THE GREATER SAPHENOUS VEIN AND FEMORAL ENDARTERECTOMY WITH COMPOSITE GRAFT;  Surgeon: Maeola Harman, MD;  Location: [facility] OR;  Service: Vascular;  Laterality: Right;   POLYPECTOMY  08/25/2020   Procedure: POLYPECTOMY;  Surgeon: Corbin Ade, MD;  Location: [facility];  Service: Endoscopy;;   ROBOT ASSISTED LAPAROSCOPIC RADICAL PROSTATECTOMY  2011   TONSILLECTOMY  age 39   and adenoids removed   Patient Active Problem List   Diagnosis Date Noted   Abnormal gait 07/27/2023   Hypertension 07/10/2023   Blister of left leg 05/24/2023   Acute blood loss anemia 03/27/2023   Phantom pain after amputation of lower extremity (HCC) 03/27/2023   Adjustment disorder with mixed anxiety and depressed mood 03/15/2023   Above knee amputation of right lower extremity (HCC) 03/13/2023   CKD (chronic kidney disease) stage 3, GFR 30-59 ml/min (HCC) 03/13/2023   Cellulitis of right lower extremity 03/09/2023   Fever 03/09/2023   Non-traumatic rhabdomyolysis 03/09/2023   Bandemia 03/09/2023   Acute kidney  injury superimposed on chronic kidney disease (HCC) 03/05/2023   Hyperkalemia 03/05/2023   Normocytic anemia 03/05/2023   History of prostate cancer 03/05/2023   Essential hypertension 03/05/2023   Metabolic acidosis, increased anion gap 03/05/2023   Critical limb ischemia of right lower extremity (HCC) 12/12/2022   Malignant neoplasm of prostate (HCC) 03/18/2019    PCP: Rica Records FNP  REFERRING PROVIDER: Genice Rouge, MD  REFERRING DIAG: [phone] (ICD-10-CM) - S/P AKA (above knee amputation), right (HCC)  THERAPY DIAG:   Other abnormalities of gait and mobility  Muscle weakness (generalized)  S/P AKA (above knee amputation), right [facility])  Rationale for Evaluation and Treatment: Rehabilitation  ONSET DATE: 03/09/23  SUBJECTIVE:   SUBJECTIVE STATEMENT: Today: Patient has [facility] f/u on October 02 2023.   Progress Note 09/10/23: Patient has recently been to [facility] to have prosthetic was adjusted; this lasted 2-3 days until the leg began to rotate inwards again. Patient reports still have issues with general mobility and phantom limb pain. The phantom limb pain can get very sharp/intense. Patient has PCP follow up soon.    Eval: Patient states he did a lot of therapy at the hospital following amputation. He has been using RW to get around. He got his prosthetic July 19th. Patient is frustrated with his fatigue, gait speed, dressing difficulties. Is mostly just walking around in the house at this point. Has to take breaks with walking in the house due to fatiuge. He enjoys baking cakes. Has to encourage himself to use leg more. Has not used leg 8 hours yet. Had some wounds prior to amputation. Has some phantom pain.   PERTINENT HISTORY: R AKA 03/09/23, admitted CIR 03/13/23-03/26/23, HTN, HLD, hx prostate cancer PAIN:  Are you having pain? Yes: NPRS scale: 7/10 Pain location: R quad region Pain description: sharp Aggravating factors: constant Relieving factors: movement, meds  PRECAUTIONS: Fall  WEIGHT BEARING RESTRICTIONS: No  FALLS:  Has patient fallen in last 6 months? No  Patient Survey  Lower Extremity Functional Scale (LEFS)  14 / 80 = 17.5 % on 09/10/23    OCCUPATION: Out on leave  PLOF: Independent  PATIENT GOALS: to be walking better like he isn't using a prosthetic     OBJECTIVE: (objective measures from initial evaluation unless otherwise dated)   COGNITION: Overall cognitive status: Within functional limits for tasks assessed     SENSATION: WFL   POSTURE: No  Significant postural limitations  PALPATION: No significant limitations  LOWER EXTREMITY ROM: slightly decreased R hip extension ROM    LOWER EXTREMITY MMT:  MMT Right eval Left eval Right  9/16 Left 9/16  Hip flexion 4 (limited range in seated) 4+ 4 4+  Hip extension 3- 3+ 3 3+  Hip abduction 3- 4- 3 4-  Hip adduction      Hip internal rotation      Hip external rotation      Knee flexion  5  5  Knee extension  5  5  Ankle dorsiflexion  5  5  Ankle plantarflexion      Ankle inversion      Ankle eversion       (Blank rows = not tested) *= pain/symptoms    FUNCTIONAL TESTS:  5 times sit to stand: 53.94 seconds with UE support on chair and RW, relies LLE Timed up and go (TUG): 2: 18 with RW (includes time to release air from prosthetic upon standing) 2 minute walk test: 24 feet with RW  09/09/13  5 times sit to stand: 35s seconds with UE support on chair and RW, Timed up and go (TUG): 16m22s with rolling walker; includes time to let air out of prosthetic 2 minute walk test:  43 feet with RW  GAIT: Distance walked: 43 feet Assistive device utilized: Walker - 2 wheeled Level of assistance: SBA Comments: , step to pattern *08/16/23: Patient ambulates using a step to gait pattern in rolling walker. Minimal increase in 1) lateral heel whip and 2) circumduction on right lower extremity during swing phase   TODAY'S TREATMENT:                                                                                                                              DATE:  09/13/23 Therapeutic Activity x40 -Side steps bilateral using // bars -S2S from elevated surface focusing on Wt Shift to the right lower extremity, using rolling walker    09/10/23 PT Progress note- objective testing/measures  09/06/23 Therapeutic exercise x12' -Seated pulleys warm up -Seated hip marches with 2# 2x10 Therapeutic Activity x28' -Standing marches on 4" step in // bars -forward wt shifts on 4"  step in // bars   09/03/23 Prosthetic Training x15' -Donning socket into ER/abduction  Therapeutic Activity x25' -S2S in // bars with wt shifts over left lower extremity then right lower extremity, UE assist    PATIENT EDUCATION:  Education details: Patient educated on exam findings, POC, scope of PT, HEP, and prone lying to promote hip extension, skin checks. Person educated: Patient Education method: Explanation, Demonstration, and Handouts Education comprehension: verbalized understanding, returned demonstration, verbal cues required, and tactile cues required  HOME EXERCISE PROGRAM: Access Code: [email or web address] URL: [email or web address]  Date: 08/09/2023 - Prone on Elbows Stretch  - 3 x daily - 7 x weekly - 10-20 minutes hold - Standing March with Counter Support  - 3 x daily - 7 x weekly - 2 sets - 10 reps - Standing Hip Abduction with Counter Support  - 3 x daily - 7 x weekly - 2 sets - 10 reps - Standing Hip Extension with Counter Support  - 3 x daily - 7 x weekly - 2 sets - 10 reps  ASSESSMENT:  CLINICAL IMPRESSION:  Today: PT focused session on S2S on elevated hi-lo mat to work on lower extremity strengthening. S2S was focused on increased wt shift to the right lower extremity; pt still hesitant to shift 100% of BW to the right lower extremity. Patient requires minimal seated rest breaks due to fatigue.  Patient will continue to benefit from PT to improve overall QoL and improve safety      09/10/23: Patient a 72 y.o. y.o. male who was seen today for physical therapy progress note for s/p R AKA. Patient has shown improvements in overall TUG speed, distance, and 5TSTS measures by ~50%. Patient still presents with deficits in LE strength, ROM, endurance, activity tolerance, gait, balance, and functional mobility with ADL.  Patient is having to modify and restrict ADL as indicated by outcome measure score as well as subjective information and objective measures which is  affecting overall participation. Patient will benefit from skilled physical therapy in order to improve function and reduce impairment.  OBJECTIVE IMPAIRMENTS: Abnormal gait, decreased activity tolerance, decreased balance, decreased mobility, difficulty walking, decreased ROM, decreased strength, increased muscle spasms, impaired flexibility, and improper body mechanics.   ACTIVITY LIMITATIONS: carrying, lifting, bending, standing, squatting, stairs, transfers, bathing, locomotion level, and caring for others  PARTICIPATION LIMITATIONS: meal prep, cleaning, laundry, shopping, community activity, and yard work  PERSONAL FACTORS: Fitness and 3+ comorbidities: R AKA, HTN, HLD  are also affecting patient's functional outcome.   REHAB POTENTIAL: Good  CLINICAL DECISION MAKING: Stable/uncomplicated  EVALUATION COMPLEXITY: Low   GOALS: Goals reviewed with patient? Yes  SHORT TERM GOALS: Target date: 08/30/2023    Patient will be independent with HEP in order to improve functional outcomes. Baseline:  Goal status: in progress 09/10/23  2.  Patient will report at least 25% improvement in symptoms for improved quality of life. Baseline:  Goal status: in progress 09/10/23    LONG TERM GOALS: Target date: 09/20/2023    Patient will report at least 75% improvement in symptoms for improved quality of life. Baseline:  Goal status: in progress 09/10/23  2.   Patient will demonstrate grade of 5/5 MMT grade in all tested musculature as evidence of improved strength to assist with stair ambulation and gait.   Baseline: see above Goal status: in progress 09/10/23  3.  Patient will be able to ambulate at least 100 feet in  with LRAD in order to demonstrate improved gait speed for community ambulation. Baseline: 24 feet with RW Goal status: in progress 09/10/23  4.  Patient will be able to complete TUG in under 24.3 seconds in order to demonstrate improved mobility to reduce risk for  falls. Baseline: 2:18 with RW (includes time to release air from prosthetic upon standing) Goal status: in progress 09/10/23  5.  Patient will be able to complete 5x STS in under 20 seconds in order to demo improved functional strength. Baseline: 53.94 seconds with UE support on chair and RW, relies LLE Goal status: in progress 09/10/23     PLAN:  PT FREQUENCY: 2x/week  PT DURATION: 6 weeks  PLANNED INTERVENTIONS: Therapeutic exercises, Therapeutic activity, Neuromuscular re-education, Balance training, Gait training, Patient/Family education, Joint manipulation, Joint mobilization, Stair training, Orthotic/Fit training, DME instructions, Aquatic Therapy, Dry Needling, Electrical stimulation, Spinal manipulation, Spinal mobilization, Cryotherapy, Moist heat, Compression bandaging, scar mobilization, Splintting, Taping, Traction, Ultrasound, Ionotophoresis 4mg /ml Dexamethasone, and Manual therapy  PLAN FOR NEXT SESSION: hip mobility and hip strength, functional strength, balance and gait training, endurance   Seymour Bars PT, DPT

## 2023-09-17 ENCOUNTER — Ambulatory Visit (HOSPITAL_COMMUNITY): Payer: Medicare Other

## 2023-09-17 DIAGNOSIS — M6281 Muscle weakness (generalized): Secondary | ICD-10-CM

## 2023-09-17 DIAGNOSIS — Z89611 Acquired absence of right leg above knee: Secondary | ICD-10-CM

## 2023-09-17 DIAGNOSIS — R2689 Other abnormalities of gait and mobility: Secondary | ICD-10-CM

## 2023-09-17 NOTE — Therapy (Signed)
OUTPATIENT PHYSICAL THERAPY LOWER EXTREMITY TREATMENT   Patient Name: [name] MRN: [id number] DOB:Jan 26, 1951, 72 y.o., male Today's Date: 09/17/2023  END OF SESSION:  PT End of Session - 09/17/23 0807     Visit Number 12    Number of Visits 16    Date for PT Re-Evaluation 09/20/23    Authorization Type [employer]    Authorization Time Period 9/1-10/13/24    Authorization - Number of Visits 12    Progress Note Due on Visit 16    PT Start Time 0800    PT Stop Time 0840    PT Time Calculation (min) 40 min    Activity Tolerance Patient tolerated treatment well    Behavior During Therapy [facility] for tasks assessed/performed                   Past Medical History:  Diagnosis Date   Family history of metabolic acidosis with increased anion gap 03/05/2023   GERD (gastroesophageal reflux disease)    History of kidney stones    Hyperlipidemia    Hypertension    Prostate cancer [facility]) 2011   radiation june 2020   Umbilical hernia    Wears dentures    full   Wears glasses    Wears partial dentures    lower   Past Surgical History:  Procedure Laterality Date   ABDOMINAL AORTOGRAM W/LOWER EXTREMITY N/A 11/20/2022   Procedure: ABDOMINAL AORTOGRAM W/LOWER EXTREMITY;  Surgeon: Maeola Harman, MD;  Location: [facility] INVASIVE CV LAB;  Service: Cardiovascular;  Laterality: N/A;   ABDOMINAL AORTOGRAM W/LOWER EXTREMITY N/A 03/05/2023   Procedure: ABDOMINAL AORTOGRAM W/LOWER EXTREMITY;  Surgeon: Maeola Harman, MD;  Location: [facility] INVASIVE CV LAB;  Service: Cardiovascular;  Laterality: N/A;   AMPUTATION Right 03/09/2023   Procedure: AMPUTATION ABOVE KNEE;  Surgeon: Maeola Harman, MD;  Location: [facility] OR;  Service: Vascular;  Laterality: Right;   COLONOSCOPY N/A 08/25/2020   Procedure: COLONOSCOPY;  Surgeon: Corbin Ade, MD;  Location[facility];  Service: Endoscopy;  Laterality: N/A;  9:30   COLONOSCOPY     CYSTOSCOPY WITH LITHOLAPAXY N/A  05/18/2021   Procedure: CYSTOSCOPY WITH LITHOLAPAXY;  Surgeon: Rene Paci, MD;  Location: [facility];  Service: Urology;  Laterality: N/A;  ONLY NEEDS  30 MIN   FEMORAL-TIBIAL BYPASS GRAFT Right 12/12/2022   Procedure: RIGHT COMMON FEMORAL-POSTERIOR TIBIAL ARTERY BYPASS WITH VEIN HARVESTING OF THE GREATER SAPHENOUS VEIN AND FEMORAL ENDARTERECTOMY WITH COMPOSITE GRAFT;  Surgeon: Maeola Harman, MD;  Location: [facility] OR;  Service: Vascular;  Laterality: Right;   POLYPECTOMY  08/25/2020   Procedure: POLYPECTOMY;  Surgeon: Corbin Ade, MD;  Location: [facility];  Service: Endoscopy;;   ROBOT ASSISTED LAPAROSCOPIC RADICAL PROSTATECTOMY  2011   TONSILLECTOMY  age 47   and adenoids removed   Patient Active Problem List   Diagnosis Date Noted   Abnormal gait 07/27/2023   Hypertension 07/10/2023   Blister of left leg 05/24/2023   Acute blood loss anemia 03/27/2023   Phantom pain after amputation of lower extremity (HCC) 03/27/2023   Adjustment disorder with mixed anxiety and depressed mood 03/15/2023   Above knee amputation of right lower extremity (HCC) 03/13/2023   CKD (chronic kidney disease) stage 3, GFR 30-59 ml/min (HCC) 03/13/2023   Cellulitis of right lower extremity 03/09/2023   Fever 03/09/2023   Non-traumatic rhabdomyolysis 03/09/2023   Bandemia 03/09/2023   Acute kidney injury superimposed on chronic kidney disease (HCC)  03/05/2023   Hyperkalemia 03/05/2023   Normocytic anemia 03/05/2023   History of prostate cancer 03/05/2023   Essential hypertension 03/05/2023   Metabolic acidosis, increased anion gap 03/05/2023   Critical limb ischemia of right lower extremity (HCC) 12/12/2022   Malignant neoplasm of prostate (HCC) 03/18/2019    PCP: Rica Records FNP  REFERRING PROVIDER: Genice Rouge, MD  REFERRING DIAG: [phone] (ICD-10-CM) - S/P AKA (above knee amputation), right (HCC)  THERAPY DIAG:  No diagnosis  found.  Rationale for Evaluation and Treatment: Rehabilitation  ONSET DATE: 03/09/23  SUBJECTIVE:   SUBJECTIVE STATEMENT: Today: Patient with no new complaints    Progress Note 09/10/23: Patient has recently been to [facility] to have prosthetic was adjusted; this lasted 2-3 days until the leg began to rotate inwards again. Patient reports still have issues with general mobility and phantom limb pain. The phantom limb pain can get very sharp/intense. Patient has PCP follow up soon.    Eval: Patient states he did a lot of therapy at the hospital following amputation. He has been using RW to get around. He got his prosthetic July 19th. Patient is frustrated with his fatigue, gait speed, dressing difficulties. Is mostly just walking around in the house at this point. Has to take breaks with walking in the house due to fatiuge. He enjoys baking cakes. Has to encourage himself to use leg more. Has not used leg 8 hours yet. Had some wounds prior to amputation. Has some phantom pain.   PERTINENT HISTORY: R AKA 03/09/23, admitted CIR 03/13/23-03/26/23, HTN, HLD, hx prostate cancer PAIN:  Are you having pain? Yes: NPRS scale: 7/10 Pain location: R quad region Pain description: sharp Aggravating factors: constant Relieving factors: movement, meds  PRECAUTIONS: Fall  WEIGHT BEARING RESTRICTIONS: No  FALLS:  Has patient fallen in last 6 months? No  Patient Survey  Lower Extremity Functional Scale (LEFS)  14 / 80 = 17.5 % on 09/10/23    OCCUPATION: Out on leave  PLOF: Independent  PATIENT GOALS: to be walking better like he isn't using a prosthetic     OBJECTIVE: (objective measures from initial evaluation unless otherwise dated)   COGNITION: Overall cognitive status: Within functional limits for tasks assessed     SENSATION: WFL   POSTURE: No Significant postural limitations  PALPATION: No significant limitations  LOWER EXTREMITY ROM: slightly decreased R hip extension  ROM    LOWER EXTREMITY MMT:  MMT Right eval Left eval Right  9/16 Left 9/16  Hip flexion 4 (limited range in seated) 4+ 4 4+  Hip extension 3- 3+ 3 3+  Hip abduction 3- 4- 3 4-  Hip adduction      Hip internal rotation      Hip external rotation      Knee flexion  5  5  Knee extension  5  5  Ankle dorsiflexion  5  5  Ankle plantarflexion      Ankle inversion      Ankle eversion       (Blank rows = not tested) *= pain/symptoms    FUNCTIONAL TESTS:  5 times sit to stand: 53.94 seconds with UE support on chair and RW, relies LLE Timed up and go (TUG): 2: 18 with RW (includes time to release air from prosthetic upon standing) 2 minute walk test: 24 feet with RW  09/09/13  5 times sit to stand: 35s seconds with UE support on chair and RW, Timed up and go (TUG): 57m22s with rolling  walker; includes time to let air out of prosthetic 2 minute walk test:  43 feet with RW  GAIT: Distance walked: 43 feet Assistive device utilized: Walker - 2 wheeled Level of assistance: SBA Comments: , step to pattern *08/16/23: Patient ambulates using a step to gait pattern in rolling walker. Minimal increase in 1) lateral heel whip and 2) circumduction on right lower extremity during swing phase   TODAY'S TREATMENT:                                                                                                                              DATE:  09/17/23 Therapeutic exercise x40'  -Seated hip marches with 2# 2x10 -Standing hip abduction/flexion in // bars with 2# 2x10 with wt shift      09/13/23 Therapeutic Activity x40 -Side steps bilateral using // bars -S2S from elevated surface focusing on Wt Shift to the right lower extremity, using rolling walker    09/10/23 PT Progress note- objective testing/measures  09/06/23 Therapeutic exercise x12' -Seated pulleys warm up -Seated hip marches with 2# 2x10 Therapeutic Activity x28' -Standing marches on 4" step in // bars -forward  wt shifts on 4" step in // bars   09/03/23 Prosthetic Training x15' -Donning socket into ER/abduction  Therapeutic Activity x25' -S2S in // bars with wt shifts over left lower extremity then right lower extremity, UE assist    PATIENT EDUCATION:  Education details: Patient educated on exam findings, POC, scope of PT, HEP, and prone lying to promote hip extension, skin checks. Person educated: Patient Education method: Explanation, Demonstration, and Handouts Education comprehension: verbalized understanding, returned demonstration, verbal cues required, and tactile cues required  HOME EXERCISE PROGRAM: Access Code: [email or web address] URL: [email or web address]  Date: 08/09/2023 - Prone on Elbows Stretch  - 3 x daily - 7 x weekly - 10-20 minutes hold - Standing March with Counter Support  - 3 x daily - 7 x weekly - 2 sets - 10 reps - Standing Hip Abduction with Counter Support  - 3 x daily - 7 x weekly - 2 sets - 10 reps - Standing Hip Extension with Counter Support  - 3 x daily - 7 x weekly - 2 sets - 10 reps  ASSESSMENT:  CLINICAL IMPRESSION:  Today: PT session focused on standing hip therapeutic exercise for lower extremity strength. Patient requires minimal seated rest breaks due to fatigue.  Patient will continue to benefit from PT to improve overall QoL and improve safety      09/10/23: Patient a 72 y.o. y.o. male who was seen today for physical therapy progress note for s/p R AKA. Patient has shown improvements in overall TUG speed, distance, and 5TSTS measures by ~50%. Patient still presents with deficits in LE strength, ROM, endurance, activity tolerance, gait, balance, and functional mobility with ADL. Patient is having to modify and restrict ADL as indicated by outcome measure score as well as subjective information and objective measures which is  affecting overall participation. Patient will benefit from skilled physical therapy in order to improve function and reduce  impairment.  OBJECTIVE IMPAIRMENTS: Abnormal gait, decreased activity tolerance, decreased balance, decreased mobility, difficulty walking, decreased ROM, decreased strength, increased muscle spasms, impaired flexibility, and improper body mechanics.   ACTIVITY LIMITATIONS: carrying, lifting, bending, standing, squatting, stairs, transfers, bathing, locomotion level, and caring for others  PARTICIPATION LIMITATIONS: meal prep, cleaning, laundry, shopping, community activity, and yard work  PERSONAL FACTORS: Fitness and 3+ comorbidities: R AKA, HTN, HLD  are also affecting patient's functional outcome.   REHAB POTENTIAL: Good  CLINICAL DECISION MAKING: Stable/uncomplicated  EVALUATION COMPLEXITY: Low   GOALS: Goals reviewed with patient? Yes  SHORT TERM GOALS: Target date: 08/30/2023    Patient will be independent with HEP in order to improve functional outcomes. Baseline:  Goal status: in progress 09/10/23  2.  Patient will report at least 25% improvement in symptoms for improved quality of life. Baseline:  Goal status: in progress 09/10/23    LONG TERM GOALS: Target date: 09/20/2023    Patient will report at least 75% improvement in symptoms for improved quality of life. Baseline:  Goal status: in progress 09/10/23  2.   Patient will demonstrate grade of 5/5 MMT grade in all tested musculature as evidence of improved strength to assist with stair ambulation and gait.   Baseline: see above Goal status: in progress 09/10/23  3.  Patient will be able to ambulate at least 100 feet in  with LRAD in order to demonstrate improved gait speed for community ambulation. Baseline: 24 feet with RW Goal status: in progress 09/10/23  4.  Patient will be able to complete TUG in under 24.3 seconds in order to demonstrate improved mobility to reduce risk for falls. Baseline: 2:18 with RW (includes time to release air from prosthetic upon standing) Goal status: in progress 09/10/23  5.   Patient will be able to complete 5x STS in under 20 seconds in order to demo improved functional strength. Baseline: 53.94 seconds with UE support on chair and RW, relies LLE Goal status: in progress 09/10/23     PLAN:  PT FREQUENCY: 2x/week  PT DURATION: 6 weeks  PLANNED INTERVENTIONS: Therapeutic exercises, Therapeutic activity, Neuromuscular re-education, Balance training, Gait training, Patient/Family education, Joint manipulation, Joint mobilization, Stair training, Orthotic/Fit training, DME instructions, Aquatic Therapy, Dry Needling, Electrical stimulation, Spinal manipulation, Spinal mobilization, Cryotherapy, Moist heat, Compression bandaging, scar mobilization, Splintting, Taping, Traction, Ultrasound, Ionotophoresis 4mg /ml Dexamethasone, and Manual therapy  PLAN FOR NEXT SESSION: hip mobility and hip strength, functional strength, balance and gait training, endurance   Seymour Bars PT, DPT

## 2023-09-19 ENCOUNTER — Other Ambulatory Visit: Payer: Self-pay | Admitting: Vascular Surgery

## 2023-09-19 NOTE — Telephone Encounter (Signed)
Patient needs all future refills from PCP to monitor lab work. Please notify patient.

## 2023-09-20 ENCOUNTER — Ambulatory Visit (HOSPITAL_COMMUNITY): Payer: Medicare Other

## 2023-09-20 DIAGNOSIS — M6281 Muscle weakness (generalized): Secondary | ICD-10-CM | POA: Diagnosis not present

## 2023-09-20 DIAGNOSIS — R2689 Other abnormalities of gait and mobility: Secondary | ICD-10-CM | POA: Diagnosis not present

## 2023-09-20 DIAGNOSIS — Z89611 Acquired absence of right leg above knee: Secondary | ICD-10-CM | POA: Diagnosis not present

## 2023-09-20 NOTE — Therapy (Signed)
OUTPATIENT PHYSICAL THERAPY LOWER EXTREMITY TREATMENT   Patient Name: [name] MRN: [id number] DOB:1951-01-30, 72 y.o., male Today's Date: 09/20/2023  END OF SESSION:  PT End of Session - 09/20/23 0806     Visit Number 13    Number of Visits 16    Date for PT Re-Evaluation 09/20/23    Authorization Type [employer]    Authorization Time Period 9/1-10/13/24    Authorization - Number of Visits 12    Progress Note Due on Visit 16    PT Start Time 0800    PT Stop Time 0840    PT Time Calculation (min) 40 min    Activity Tolerance Patient tolerated treatment well    Behavior During Therapy [facility] for tasks assessed/performed                    Past Medical History:  Diagnosis Date   Family history of metabolic acidosis with increased anion gap 03/05/2023   GERD (gastroesophageal reflux disease)    History of kidney stones    Hyperlipidemia    Hypertension    Prostate cancer [facility]) 2011   radiation june 2020   Umbilical hernia    Wears dentures    full   Wears glasses    Wears partial dentures    lower   Past Surgical History:  Procedure Laterality Date   ABDOMINAL AORTOGRAM W/LOWER EXTREMITY N/A 11/20/2022   Procedure: ABDOMINAL AORTOGRAM W/LOWER EXTREMITY;  Surgeon: Maeola Harman, MD;  Location: [facility] INVASIVE CV LAB;  Service: Cardiovascular;  Laterality: N/A;   ABDOMINAL AORTOGRAM W/LOWER EXTREMITY N/A 03/05/2023   Procedure: ABDOMINAL AORTOGRAM W/LOWER EXTREMITY;  Surgeon: Maeola Harman, MD;  Location: [facility] INVASIVE CV LAB;  Service: Cardiovascular;  Laterality: N/A;   AMPUTATION Right 03/09/2023   Procedure: AMPUTATION ABOVE KNEE;  Surgeon: Maeola Harman, MD;  Location: [facility] OR;  Service: Vascular;  Laterality: Right;   COLONOSCOPY N/A 08/25/2020   Procedure: COLONOSCOPY;  Surgeon: Corbin Ade, MD;  Location: [facility];  Service: Endoscopy;  Laterality: N/A;  9:30   COLONOSCOPY     CYSTOSCOPY WITH LITHOLAPAXY N/A  05/18/2021   Procedure: CYSTOSCOPY WITH LITHOLAPAXY;  Surgeon: Rene Paci, MD;  Location: [facility];  Service: Urology;  Laterality: N/A;  ONLY NEEDS  30 MIN   FEMORAL-TIBIAL BYPASS GRAFT Right 12/12/2022   Procedure: RIGHT COMMON FEMORAL-POSTERIOR TIBIAL ARTERY BYPASS WITH VEIN HARVESTING OF THE GREATER SAPHENOUS VEIN AND FEMORAL ENDARTERECTOMY WITH COMPOSITE GRAFT;  Surgeon: Maeola Harman, MD;  Location: [facility] OR;  Service: Vascular;  Laterality: Right;   POLYPECTOMY  08/25/2020   Procedure: POLYPECTOMY;  Surgeon: Corbin Ade, MD;  Location: [facility];  Service: Endoscopy;;   ROBOT ASSISTED LAPAROSCOPIC RADICAL PROSTATECTOMY  2011   TONSILLECTOMY  age 41   and adenoids removed   Patient Active Problem List   Diagnosis Date Noted   Abnormal gait 07/27/2023   Hypertension 07/10/2023   Blister of left leg 05/24/2023   Acute blood loss anemia 03/27/2023   Phantom pain after amputation of lower extremity (HCC) 03/27/2023   Adjustment disorder with mixed anxiety and depressed mood 03/15/2023   Above knee amputation of right lower extremity (HCC) 03/13/2023   CKD (chronic kidney disease) stage 3, GFR 30-59 ml/min (HCC) 03/13/2023   Cellulitis of right lower extremity 03/09/2023   Fever 03/09/2023   Non-traumatic rhabdomyolysis 03/09/2023   Bandemia 03/09/2023   Acute kidney injury superimposed on chronic kidney disease (  HCC) 03/05/2023   Hyperkalemia 03/05/2023   Normocytic anemia 03/05/2023   History of prostate cancer 03/05/2023   Essential hypertension 03/05/2023   Metabolic acidosis, increased anion gap 03/05/2023   Critical limb ischemia of right lower extremity (HCC) 12/12/2022   Malignant neoplasm of prostate (HCC) 03/18/2019    PCP: Rica Records FNP  REFERRING PROVIDER: Genice Rouge, MD  REFERRING DIAG: [phone] (ICD-10-CM) - S/P AKA (above knee amputation), right (HCC)  THERAPY DIAG:  No diagnosis  found.  Rationale for Evaluation and Treatment: Rehabilitation  ONSET DATE: 03/09/23  SUBJECTIVE:   SUBJECTIVE STATEMENT: Today: Patient with no new complaints    Progress Note 09/10/23: Patient has recently been to [facility] to have prosthetic was adjusted; this lasted 2-3 days until the leg began to rotate inwards again. Patient reports still have issues with general mobility and phantom limb pain. The phantom limb pain can get very sharp/intense. Patient has PCP follow up soon.    Eval: Patient states he did a lot of therapy at the hospital following amputation. He has been using RW to get around. He got his prosthetic July 19th. Patient is frustrated with his fatigue, gait speed, dressing difficulties. Is mostly just walking around in the house at this point. Has to take breaks with walking in the house due to fatiuge. He enjoys baking cakes. Has to encourage himself to use leg more. Has not used leg 8 hours yet. Had some wounds prior to amputation. Has some phantom pain.   PERTINENT HISTORY: R AKA 03/09/23, admitted CIR 03/13/23-03/26/23, HTN, HLD, hx prostate cancer PAIN:  Are you having pain? Yes: NPRS scale: 7/10 Pain location: R quad region Pain description: sharp Aggravating factors: constant Relieving factors: movement, meds  PRECAUTIONS: Fall  WEIGHT BEARING RESTRICTIONS: No  FALLS:  Has patient fallen in last 6 months? No  Patient Survey  Lower Extremity Functional Scale (LEFS)  14 / 80 = 17.5 % on 09/10/23    OCCUPATION: Out on leave  PLOF: Independent  PATIENT GOALS: to be walking better like he isn't using a prosthetic     OBJECTIVE: (objective measures from initial evaluation unless otherwise dated)   COGNITION: Overall cognitive status: Within functional limits for tasks assessed     SENSATION: WFL   POSTURE: No Significant postural limitations  PALPATION: No significant limitations  LOWER EXTREMITY ROM: slightly decreased R hip extension  ROM    LOWER EXTREMITY MMT:  MMT Right eval Left eval Right  9/16 Left 9/16  Hip flexion 4 (limited range in seated) 4+ 4 4+  Hip extension 3- 3+ 3 3+  Hip abduction 3- 4- 3 4-  Hip adduction      Hip internal rotation      Hip external rotation      Knee flexion  5  5  Knee extension  5  5  Ankle dorsiflexion  5  5  Ankle plantarflexion      Ankle inversion      Ankle eversion       (Blank rows = not tested) *= pain/symptoms    FUNCTIONAL TESTS:  5 times sit to stand: 53.94 seconds with UE support on chair and RW, relies LLE Timed up and go (TUG): 2: 18 with RW (includes time to release air from prosthetic upon standing) 2 minute walk test: 24 feet with RW  09/09/13  5 times sit to stand: 35s seconds with UE support on chair and RW, Timed up and go (TUG): 52m22s with  rolling walker; includes time to let air out of prosthetic 2 minute walk test:  43 feet with RW  GAIT: Distance walked: 43 feet Assistive device utilized: Walker - 2 wheeled Level of assistance: SBA Comments: , step to pattern *08/16/23: Patient ambulates using a step to gait pattern in rolling walker. Minimal increase in 1) lateral heel whip and 2) circumduction on right lower extremity during swing phase   TODAY'S TREATMENT:                                                                                                                              DATE:  09/20/23 Therapeutic exercise x40' -Seated hip marches with 2# 2x10 -Standing hip abduction/flexion in // bars with 2# 2x10 with wt shift  -Standing theraband Rows/Ext  with Blk theraband 2x10 -UBE Retro   09/17/23 Therapeutic exercise x40'  -Seated hip marches with 2# 2x10 -Standing hip abduction/flexion in // bars with 2# 2x10 with wt shift    09/13/23 Therapeutic Activity x40 -Side steps bilateral using // bars -S2S from elevated surface focusing on Wt Shift to the right lower extremity, using rolling walker   09/10/23 PT Progress  note- objective testing/measures   PATIENT EDUCATION:  Education details: Patient educated on exam findings, POC, scope of PT, HEP, and prone lying to promote hip extension, skin checks. Person educated: Patient Education method: Explanation, Demonstration, and Handouts Education comprehension: verbalized understanding, returned demonstration, verbal cues required, and tactile cues required  HOME EXERCISE PROGRAM: Access Code: [email or web address] URL: [email or web address]  Date: 08/09/2023 - Prone on Elbows Stretch  - 3 x daily - 7 x weekly - 10-20 minutes hold - Standing March with Counter Support  - 3 x daily - 7 x weekly - 2 sets - 10 reps - Standing Hip Abduction with Counter Support  - 3 x daily - 7 x weekly - 2 sets - 10 reps - Standing Hip Extension with Counter Support  - 3 x daily - 7 x weekly - 2 sets - 10 reps  ASSESSMENT:  CLINICAL IMPRESSION:  Today: PT session focused on standing hip therapeutic exercise for lower extremity strength. Patient requires minimal seated rest breaks due to fatigue. PT introduced shoulder strengthening as well because pt c/o shoulder pain from leaning heavily onto his rolling walker during ambulation  Patient will continue to benefit from PT to improve overall QoL and improve safety      09/10/23: Patient a 72 y.o. y.o. male who was seen today for physical therapy progress note for s/p R AKA. Patient has shown improvements in overall TUG speed, distance, and 5TSTS measures by ~50%. Patient still presents with deficits in LE strength, ROM, endurance, activity tolerance, gait, balance, and functional mobility with ADL. Patient is having to modify and restrict ADL as indicated by outcome measure score as well as subjective information and objective measures which is affecting overall participation. Patient will benefit from skilled physical therapy in order to improve  function and reduce impairment.  OBJECTIVE IMPAIRMENTS: Abnormal gait,  decreased activity tolerance, decreased balance, decreased mobility, difficulty walking, decreased ROM, decreased strength, increased muscle spasms, impaired flexibility, and improper body mechanics.   ACTIVITY LIMITATIONS: carrying, lifting, bending, standing, squatting, stairs, transfers, bathing, locomotion level, and caring for others  PARTICIPATION LIMITATIONS: meal prep, cleaning, laundry, shopping, community activity, and yard work  PERSONAL FACTORS: Fitness and 3+ comorbidities: R AKA, HTN, HLD  are also affecting patient's functional outcome.   REHAB POTENTIAL: Good  CLINICAL DECISION MAKING: Stable/uncomplicated  EVALUATION COMPLEXITY: Low   GOALS: Goals reviewed with patient? Yes  SHORT TERM GOALS: Target date: 08/30/2023    Patient will be independent with HEP in order to improve functional outcomes. Baseline:  Goal status: in progress 09/10/23  2.  Patient will report at least 25% improvement in symptoms for improved quality of life. Baseline:  Goal status: in progress 09/10/23    LONG TERM GOALS: Target date: 09/20/2023    Patient will report at least 75% improvement in symptoms for improved quality of life. Baseline:  Goal status: in progress 09/10/23  2.   Patient will demonstrate grade of 5/5 MMT grade in all tested musculature as evidence of improved strength to assist with stair ambulation and gait.   Baseline: see above Goal status: in progress 09/10/23  3.  Patient will be able to ambulate at least 100 feet in  with LRAD in order to demonstrate improved gait speed for community ambulation. Baseline: 24 feet with RW Goal status: in progress 09/10/23  4.  Patient will be able to complete TUG in under 24.3 seconds in order to demonstrate improved mobility to reduce risk for falls. Baseline: 2:18 with RW (includes time to release air from prosthetic upon standing) Goal status: in progress 09/10/23  5.  Patient will be able to complete 5x STS in under 20  seconds in order to demo improved functional strength. Baseline: 53.94 seconds with UE support on chair and RW, relies LLE Goal status: in progress 09/10/23     PLAN:  PT FREQUENCY: 2x/week  PT DURATION: 6 weeks  PLANNED INTERVENTIONS: Therapeutic exercises, Therapeutic activity, Neuromuscular re-education, Balance training, Gait training, Patient/Family education, Joint manipulation, Joint mobilization, Stair training, Orthotic/Fit training, DME instructions, Aquatic Therapy, Dry Needling, Electrical stimulation, Spinal manipulation, Spinal mobilization, Cryotherapy, Moist heat, Compression bandaging, scar mobilization, Splintting, Taping, Traction, Ultrasound, Ionotophoresis 4mg /ml Dexamethasone, and Manual therapy  PLAN FOR NEXT SESSION: hip mobility and hip strength, functional strength, balance and gait training, endurance   Seymour Bars PT, DPT

## 2023-09-27 ENCOUNTER — Ambulatory Visit (HOSPITAL_COMMUNITY): Payer: Medicare Other | Attending: Physical Medicine and Rehabilitation

## 2023-09-27 DIAGNOSIS — Z89611 Acquired absence of right leg above knee: Secondary | ICD-10-CM | POA: Diagnosis not present

## 2023-09-27 DIAGNOSIS — M6281 Muscle weakness (generalized): Secondary | ICD-10-CM | POA: Diagnosis not present

## 2023-09-27 DIAGNOSIS — R2689 Other abnormalities of gait and mobility: Secondary | ICD-10-CM | POA: Diagnosis not present

## 2023-09-27 NOTE — Therapy (Signed)
OUTPATIENT PHYSICAL THERAPY LOWER EXTREMITY TREATMENT   Patient Name: [name] MRN: [id number] DOB:1951-11-02, 72 y.o., male Today's Date: 09/27/2023  END OF SESSION:  PT End of Session - 09/27/23 0812     Visit Number 14    Number of Visits 20    Date for PT Re-Evaluation 09/20/23    Authorization Type [employer]    Authorization Time Period 9/26-11/11    Authorization - Number of Visits 12    Progress Note Due on Visit 16    PT Start Time 0805    PT Stop Time 0845    PT Time Calculation (min) 40 min    Activity Tolerance Patient tolerated treatment well    Behavior During Therapy [facility] for tasks assessed/performed                  Past Medical History:  Diagnosis Date   Family history of metabolic acidosis with increased anion gap 03/05/2023   GERD (gastroesophageal reflux disease)    History of kidney stones    Hyperlipidemia    Hypertension    Prostate cancer [facility]) 2011   radiation june 2020   Umbilical hernia    Wears dentures    full   Wears glasses    Wears partial dentures    lower   Past Surgical History:  Procedure Laterality Date   ABDOMINAL AORTOGRAM W/LOWER EXTREMITY N/A 11/20/2022   Procedure: ABDOMINAL AORTOGRAM W/LOWER EXTREMITY;  Surgeon: Maeola Harman, MD;  Location: [facility] INVASIVE CV LAB;  Service: Cardiovascular;  Laterality: N/A;   ABDOMINAL AORTOGRAM W/LOWER EXTREMITY N/A 03/05/2023   Procedure: ABDOMINAL AORTOGRAM W/LOWER EXTREMITY;  Surgeon: Maeola Harman, MD;  Location: [facility] INVASIVE CV LAB;  Service: Cardiovascular;  Laterality: N/A;   AMPUTATION Right 03/09/2023   Procedure: AMPUTATION ABOVE KNEE;  Surgeon: Maeola Harman, MD;  Location: [facility] OR;  Service: Vascular;  Laterality: Right;   COLONOSCOPY N/A 08/25/2020   Procedure: COLONOSCOPY;  Surgeon: Corbin Ade, MD;  Location: [facility];  Service: Endoscopy;  Laterality: N/A;  9:30   COLONOSCOPY     CYSTOSCOPY WITH LITHOLAPAXY N/A  05/18/2021   Procedure: CYSTOSCOPY WITH LITHOLAPAXY;  Surgeon: Rene Paci, MD;  Location: [facility];  Service: Urology;  Laterality: N/A;  ONLY NEEDS  30 MIN   FEMORAL-TIBIAL BYPASS GRAFT Right 12/12/2022   Procedure: RIGHT COMMON FEMORAL-POSTERIOR TIBIAL ARTERY BYPASS WITH VEIN HARVESTING OF THE GREATER SAPHENOUS VEIN AND FEMORAL ENDARTERECTOMY WITH COMPOSITE GRAFT;  Surgeon: Maeola Harman, MD;  Location: [facility] OR;  Service: Vascular;  Laterality: Right;   POLYPECTOMY  08/25/2020   Procedure: POLYPECTOMY;  Surgeon: Corbin Ade, MD;  Location: [facility];  Service: Endoscopy;;   ROBOT ASSISTED LAPAROSCOPIC RADICAL PROSTATECTOMY  2011   TONSILLECTOMY  age 15   and adenoids removed   Patient Active Problem List   Diagnosis Date Noted   Abnormal gait 07/27/2023   Hypertension 07/10/2023   Blister of left leg 05/24/2023   Acute blood loss anemia 03/27/2023   Phantom pain after amputation of lower extremity (HCC) 03/27/2023   Adjustment disorder with mixed anxiety and depressed mood 03/15/2023   Above knee amputation of right lower extremity (HCC) 03/13/2023   CKD (chronic kidney disease) stage 3, GFR 30-59 ml/min (HCC) 03/13/2023   Cellulitis of right lower extremity 03/09/2023   Fever 03/09/2023   Non-traumatic rhabdomyolysis 03/09/2023   Bandemia 03/09/2023   Acute kidney injury superimposed on chronic kidney disease (HCC) 03/05/2023  Hyperkalemia 03/05/2023   Normocytic anemia 03/05/2023   History of prostate cancer 03/05/2023   Essential hypertension 03/05/2023   Metabolic acidosis, increased anion gap 03/05/2023   Critical limb ischemia of right lower extremity (HCC) 12/12/2022   Malignant neoplasm of prostate (HCC) 03/18/2019    PCP: Rica Records FNP  REFERRING PROVIDER: Genice Rouge, MD  REFERRING DIAG: [phone] (ICD-10-CM) - S/P AKA (above knee amputation), right (HCC)  THERAPY DIAG:  No diagnosis  found.  Rationale for Evaluation and Treatment: Rehabilitation  ONSET DATE: 03/09/23  SUBJECTIVE:   SUBJECTIVE STATEMENT: Today: Patient with no new complaints. Sees MD 10/02/23 for prosthetic adjustments NPRS today: 2/10 in right lower extremity but 5/10 phantom limb pain    Progress Note 09/10/23: Patient has recently been to [facility] to have prosthetic was adjusted; this lasted 2-3 days until the leg began to rotate inwards again. Patient reports still have issues with general mobility and phantom limb pain. The phantom limb pain can get very sharp/intense. Patient has PCP follow up soon.    Eval: Patient states he did a lot of therapy at the hospital following amputation. He has been using RW to get around. He got his prosthetic July 19th. Patient is frustrated with his fatigue, gait speed, dressing difficulties. Is mostly just walking around in the house at this point. Has to take breaks with walking in the house due to fatiuge. He enjoys baking cakes. Has to encourage himself to use leg more. Has not used leg 8 hours yet. Had some wounds prior to amputation. Has some phantom pain.   PERTINENT HISTORY: R AKA 03/09/23, admitted CIR 03/13/23-03/26/23, HTN, HLD, hx prostate cancer PAIN:  Are you having pain? Yes: NPRS scale: 7/10 Pain location: R quad region Pain description: sharp Aggravating factors: constant Relieving factors: movement, meds  PRECAUTIONS: Fall  WEIGHT BEARING RESTRICTIONS: No  FALLS:  Has patient fallen in last 6 months? No  Patient Survey  Lower Extremity Functional Scale (LEFS)  14 / 80 = 17.5 % on 09/10/23    OCCUPATION: Out on leave  PLOF: Independent  PATIENT GOALS: to be walking better like he isn't using a prosthetic     OBJECTIVE: (objective measures from initial evaluation unless otherwise dated)   COGNITION: Overall cognitive status: Within functional limits for tasks assessed     SENSATION: WFL   POSTURE: No Significant postural  limitations  PALPATION: No significant limitations  LOWER EXTREMITY ROM: slightly decreased R hip extension ROM    LOWER EXTREMITY MMT:  MMT Right eval Left eval Right  9/16 Left 9/16  Hip flexion 4 (limited range in seated) 4+ 4 4+  Hip extension 3- 3+ 3 3+  Hip abduction 3- 4- 3 4-  Hip adduction      Hip internal rotation      Hip external rotation      Knee flexion  5  5  Knee extension  5  5  Ankle dorsiflexion  5  5  Ankle plantarflexion      Ankle inversion      Ankle eversion       (Blank rows = not tested) *= pain/symptoms    FUNCTIONAL TESTS:  5 times sit to stand: 53.94 seconds with UE support on chair and RW, relies LLE Timed up and go (TUG): 2: 18 with RW (includes time to release air from prosthetic upon standing) 2 minute walk test: 24 feet with RW  09/09/13  5 times sit to stand: 35s seconds  with UE support on chair and RW, Timed up and go (TUG): 102m22s with rolling walker; includes time to let air out of prosthetic 2 minute walk test:  43 feet with RW  GAIT: Distance walked: 43 feet Assistive device utilized: Walker - 2 wheeled Level of assistance: SBA Comments: , step to pattern *08/16/23: Patient ambulates using a step to gait pattern in rolling walker. Minimal increase in 1) lateral heel whip and 2) circumduction on right lower extremity during swing phase   TODAY'S TREATMENT:                                                                                                                              DATE: 09/27/23 -Seated bilateral hip isometrics + Mirror for phantom limb pain -S2S on elevated table leaning to right UE  -Gait using large base cane with a 2 point step to pattern, contact guard assist x90feet  09/20/23 Therapeutic exercise x40' -Seated hip marches with 2# 2x10 -Standing hip abduction/flexion in // bars with 2# 2x10 with wt shift  -Standing theraband Rows/Ext  with Blk theraband 2x10 -UBE Retro   09/17/23 Therapeutic  exercise x40'  -Seated hip marches with 2# 2x10 -Standing hip abduction/flexion in // bars with 2# 2x10 with wt shift    PATIENT EDUCATION:  Education details: Patient educated on exam findings, POC, scope of PT, HEP, and prone lying to promote hip extension, skin checks. Person educated: Patient Education method: Explanation, Demonstration, and Handouts Education comprehension: verbalized understanding, returned demonstration, verbal cues required, and tactile cues required  HOME EXERCISE PROGRAM: Access Code: [email or web address] URL: [email or web address]  Date: 08/09/2023 - Prone on Elbows Stretch  - 3 x daily - 7 x weekly - 10-20 minutes hold - Standing March with Counter Support  - 3 x daily - 7 x weekly - 2 sets - 10 reps - Standing Hip Abduction with Counter Support  - 3 x daily - 7 x weekly - 2 sets - 10 reps - Standing Hip Extension with Counter Support  - 3 x daily - 7 x weekly - 2 sets - 10 reps  ASSESSMENT:  CLINICAL IMPRESSION:  Today: PT session focused on hip therapeutic exercise + Mirror Therapy for lower extremity strength/phantom limb pain. PT re-introduced gait using large based cane; patient still with moderate unsteadiness on large base cane and requires contact guard assist. PT kept chair nearby due to unsteadiness.  Patient will continue to benefit from PT to improve overall QoL and improve safety      09/10/23: Patient a 72 y.o. y.o. male who was seen today for physical therapy progress note for s/p R AKA. Patient has shown improvements in overall TUG speed, distance, and 5TSTS measures by ~50%. Patient still presents with deficits in LE strength, ROM, endurance, activity tolerance, gait, balance, and functional mobility with ADL. Patient is having to modify and restrict ADL as indicated by outcome measure score as well as subjective information and objective  measures which is affecting overall participation. Patient will benefit from skilled physical  therapy in order to improve function and reduce impairment.  OBJECTIVE IMPAIRMENTS: Abnormal gait, decreased activity tolerance, decreased balance, decreased mobility, difficulty walking, decreased ROM, decreased strength, increased muscle spasms, impaired flexibility, and improper body mechanics.   ACTIVITY LIMITATIONS: carrying, lifting, bending, standing, squatting, stairs, transfers, bathing, locomotion level, and caring for others  PARTICIPATION LIMITATIONS: meal prep, cleaning, laundry, shopping, community activity, and yard work  PERSONAL FACTORS: Fitness and 3+ comorbidities: R AKA, HTN, HLD  are also affecting patient's functional outcome.   REHAB POTENTIAL: Good  CLINICAL DECISION MAKING: Stable/uncomplicated  EVALUATION COMPLEXITY: Low   GOALS: Goals reviewed with patient? Yes  SHORT TERM GOALS: Target date: 08/30/2023    Patient will be independent with HEP in order to improve functional outcomes. Baseline:  Goal status: in progress 09/10/23  2.  Patient will report at least 25% improvement in symptoms for improved quality of life. Baseline:  Goal status: in progress 09/10/23    LONG TERM GOALS: Target date: 09/20/2023    Patient will report at least 75% improvement in symptoms for improved quality of life. Baseline:  Goal status: in progress 09/10/23  2.   Patient will demonstrate grade of 5/5 MMT grade in all tested musculature as evidence of improved strength to assist with stair ambulation and gait.   Baseline: see above Goal status: in progress 09/10/23  3.  Patient will be able to ambulate at least 100 feet in  with LRAD in order to demonstrate improved gait speed for community ambulation. Baseline: 24 feet with RW Goal status: in progress 09/10/23  4.  Patient will be able to complete TUG in under 24.3 seconds in order to demonstrate improved mobility to reduce risk for falls. Baseline: 2:18 with RW (includes time to release air from prosthetic upon  standing) Goal status: in progress 09/10/23  5.  Patient will be able to complete 5x STS in under 20 seconds in order to demo improved functional strength. Baseline: 53.94 seconds with UE support on chair and RW, relies LLE Goal status: in progress 09/10/23     PLAN:  PT FREQUENCY: 2x/week  PT DURATION: 6 weeks  PLANNED INTERVENTIONS: Therapeutic exercises, Therapeutic activity, Neuromuscular re-education, Balance training, Gait training, Patient/Family education, Joint manipulation, Joint mobilization, Stair training, Orthotic/Fit training, DME instructions, Aquatic Therapy, Dry Needling, Electrical stimulation, Spinal manipulation, Spinal mobilization, Cryotherapy, Moist heat, Compression bandaging, scar mobilization, Splintting, Taping, Traction, Ultrasound, Ionotophoresis 4mg /ml Dexamethasone, and Manual therapy  PLAN FOR NEXT SESSION: hip mobility and hip strength, functional strength, balance and gait training, endurance   Seymour Bars PT, DPT

## 2023-10-01 ENCOUNTER — Ambulatory Visit (HOSPITAL_COMMUNITY): Payer: Medicare Other

## 2023-10-01 DIAGNOSIS — R2689 Other abnormalities of gait and mobility: Secondary | ICD-10-CM | POA: Diagnosis not present

## 2023-10-01 DIAGNOSIS — M6281 Muscle weakness (generalized): Secondary | ICD-10-CM

## 2023-10-01 DIAGNOSIS — Z89611 Acquired absence of right leg above knee: Secondary | ICD-10-CM | POA: Diagnosis not present

## 2023-10-01 NOTE — Therapy (Signed)
OUTPATIENT PHYSICAL THERAPY LOWER EXTREMITY TREATMENT   Patient Name: [name] MRN: [id number] DOB:02/07/1951, 72 y.o., male Today's Date: 10/01/2023  END OF SESSION:  PT End of Session - 10/01/23 0806     Visit Number 15    Number of Visits 20    Date for PT Re-Evaluation 09/20/23    Authorization Type [employer]    Authorization Time Period 9/26-11/11    Authorization - Number of Visits 12    Progress Note Due on Visit 16    PT Start Time 0802    PT Stop Time 0842    PT Time Calculation (min) 40 min    Activity Tolerance Patient tolerated treatment well    Behavior During Therapy [facility] for tasks assessed/performed                  Past Medical History:  Diagnosis Date   Family history of metabolic acidosis with increased anion gap 03/05/2023   GERD (gastroesophageal reflux disease)    History of kidney stones    Hyperlipidemia    Hypertension    Prostate cancer [facility]) 2011   radiation june 2020   Umbilical hernia    Wears dentures    full   Wears glasses    Wears partial dentures    lower   Past Surgical History:  Procedure Laterality Date   ABDOMINAL AORTOGRAM W/LOWER EXTREMITY N/A 11/20/2022   Procedure: ABDOMINAL AORTOGRAM W/LOWER EXTREMITY;  Surgeon: Maeola Harman, MD;  Location: [facility] INVASIVE CV LAB;  Service: Cardiovascular;  Laterality: N/A;   ABDOMINAL AORTOGRAM W/LOWER EXTREMITY N/A 03/05/2023   Procedure: ABDOMINAL AORTOGRAM W/LOWER EXTREMITY;  Surgeon: Maeola Harman, MD;  Location: [facility] INVASIVE CV LAB;  Service: Cardiovascular;  Laterality: N/A;   AMPUTATION Right 03/09/2023   Procedure: AMPUTATION ABOVE KNEE;  Surgeon: Maeola Harman, MD;  Location: [facility] OR;  Service: Vascular;  Laterality: Right;   COLONOSCOPY N/A 08/25/2020   Procedure: COLONOSCOPY;  Surgeon: Corbin Ade, MD;  Location[facility];  Service: Endoscopy;  Laterality: N/A;  9:30   COLONOSCOPY     CYSTOSCOPY WITH LITHOLAPAXY N/A  05/18/2021   Procedure: CYSTOSCOPY WITH LITHOLAPAXY;  Surgeon: Rene Paci, MD;  Location: [facility];  Service: Urology;  Laterality: N/A;  ONLY NEEDS  30 MIN   FEMORAL-TIBIAL BYPASS GRAFT Right 12/12/2022   Procedure: RIGHT COMMON FEMORAL-POSTERIOR TIBIAL ARTERY BYPASS WITH VEIN HARVESTING OF THE GREATER SAPHENOUS VEIN AND FEMORAL ENDARTERECTOMY WITH COMPOSITE GRAFT;  Surgeon: Maeola Harman, MD;  Location: [facility] OR;  Service: Vascular;  Laterality: Right;   POLYPECTOMY  08/25/2020   Procedure: POLYPECTOMY;  Surgeon: Corbin Ade, MD;  Location: [facility];  Service: Endoscopy;;   ROBOT ASSISTED LAPAROSCOPIC RADICAL PROSTATECTOMY  2011   TONSILLECTOMY  age 89   and adenoids removed   Patient Active Problem List   Diagnosis Date Noted   Abnormal gait 07/27/2023   Hypertension 07/10/2023   Blister of left leg 05/24/2023   Acute blood loss anemia 03/27/2023   Phantom pain after amputation of lower extremity (HCC) 03/27/2023   Adjustment disorder with mixed anxiety and depressed mood 03/15/2023   Above knee amputation of right lower extremity (HCC) 03/13/2023   CKD (chronic kidney disease) stage 3, GFR 30-59 ml/min (HCC) 03/13/2023   Cellulitis of right lower extremity 03/09/2023   Fever 03/09/2023   Non-traumatic rhabdomyolysis 03/09/2023   Bandemia 03/09/2023   Acute kidney injury superimposed on chronic kidney disease (HCC) 03/05/2023  Hyperkalemia 03/05/2023   Normocytic anemia 03/05/2023   History of prostate cancer 03/05/2023   Essential hypertension 03/05/2023   Metabolic acidosis, increased anion gap 03/05/2023   Critical limb ischemia of right lower extremity (HCC) 12/12/2022   Malignant neoplasm of prostate (HCC) 03/18/2019    PCP: Rica Records FNP  REFERRING PROVIDER: Genice Rouge, MD  REFERRING DIAG: [phone] (ICD-10-CM) - S/P AKA (above knee amputation), right (HCC)  THERAPY DIAG:  Other abnormalities of gait and  mobility  Muscle weakness (generalized)  S/P AKA (above knee amputation), right (HCC)  Rationale for Evaluation and Treatment: Rehabilitation  ONSET DATE: 03/09/23  SUBJECTIVE:   SUBJECTIVE STATEMENT: Today: Patient states he feels his residual limb shrinking and felt his knee "give out" and almost fell  NPRS today: 2/10 in right lower extremity but 7-8/10 phantom limb pain    Progress Note 09/10/23: Patient has recently been to [facility] to have prosthetic was adjusted; this lasted 2-3 days until the leg began to rotate inwards again. Patient reports still have issues with general mobility and phantom limb pain. The phantom limb pain can get very sharp/intense. Patient has PCP follow up soon.    Eval: Patient states he did a lot of therapy at the hospital following amputation. He has been using RW to get around. He got his prosthetic July 19th. Patient is frustrated with his fatigue, gait speed, dressing difficulties. Is mostly just walking around in the house at this point. Has to take breaks with walking in the house due to fatiuge. He enjoys baking cakes. Has to encourage himself to use leg more. Has not used leg 8 hours yet. Had some wounds prior to amputation. Has some phantom pain.   PERTINENT HISTORY: R AKA 03/09/23, admitted CIR 03/13/23-03/26/23, HTN, HLD, hx prostate cancer PAIN:  Are you having pain? Yes: NPRS scale: 7/10 Pain location: R quad region Pain description: sharp Aggravating factors: constant Relieving factors: movement, meds  PRECAUTIONS: Fall  WEIGHT BEARING RESTRICTIONS: No  FALLS:  Has patient fallen in last 6 months? No  Patient Survey  Lower Extremity Functional Scale (LEFS)  14 / 80 = 17.5 % on 09/10/23    OCCUPATION: Out on leave  PLOF: Independent  PATIENT GOALS: to be walking better like he isn't using a prosthetic     OBJECTIVE: (objective measures from initial evaluation unless otherwise dated)   COGNITION: Overall cognitive  status: Within functional limits for tasks assessed     SENSATION: WFL   POSTURE: No Significant postural limitations  PALPATION: No significant limitations  LOWER EXTREMITY ROM: slightly decreased R hip extension ROM    LOWER EXTREMITY MMT:  MMT Right eval Left eval Right  9/16 Left 9/16  Hip flexion 4 (limited range in seated) 4+ 4 4+  Hip extension 3- 3+ 3 3+  Hip abduction 3- 4- 3 4-  Hip adduction      Hip internal rotation      Hip external rotation      Knee flexion  5  5  Knee extension  5  5  Ankle dorsiflexion  5  5  Ankle plantarflexion      Ankle inversion      Ankle eversion       (Blank rows = not tested) *= pain/symptoms    FUNCTIONAL TESTS:  5 times sit to stand: 53.94 seconds with UE support on chair and RW, relies LLE Timed up and go (TUG): 2: 18 with RW (includes time to release air  from prosthetic upon standing) 2 minute walk test: 24 feet with RW  09/09/13  5 times sit to stand: 35s seconds with UE support on chair and RW, Timed up and go (TUG): 60m22s with rolling walker; includes time to let air out of prosthetic 2 minute walk test:  43 feet with RW  GAIT: Distance walked: 43 feet Assistive device utilized: Walker - 2 wheeled Level of assistance: SBA Comments: , step to pattern *08/16/23: Patient ambulates using a step to gait pattern in rolling walker. Minimal increase in 1) lateral heel whip and 2) circumduction on right lower extremity during swing phase   TODAY'S TREATMENT:                                                                                                                              DATE: 10/01/23 Therapeutic exercise x32' -Seated marches with 2# 2x10  -Standing hip abduction with 2# in // bars Therapeutic Activity x8' -Wide base cane walking throughout session, contact guard assist + chair nearby -S2S from elevated surface to large base cane, contact guard assist     09/27/23 -Seated bilateral hip isometrics  + Mirror for phantom limb pain -S2S on elevated table leaning to right UE  -Gait using large base cane with a 2 point step to pattern, contact guard assist x65feet  09/20/23 Therapeutic exercise x40' -Seated hip marches with 2# 2x10 -Standing hip abduction/flexion in // bars with 2# 2x10 with wt shift  -Standing theraband Rows/Ext  with Blk theraband 2x10 -UBE Retro   09/17/23 Therapeutic exercise x40'  -Seated hip marches with 2# 2x10 -Standing hip abduction/flexion in // bars with 2# 2x10 with wt shift    PATIENT EDUCATION:  Education details: Patient educated on exam findings, POC, scope of PT, HEP, and prone lying to promote hip extension, skin checks. Person educated: Patient Education method: Explanation, Demonstration, and Handouts Education comprehension: verbalized understanding, returned demonstration, verbal cues required, and tactile cues required  HOME EXERCISE PROGRAM: Access Code: [email or web address] URL: [email or web address]  Date: 08/09/2023 - Prone on Elbows Stretch  - 3 x daily - 7 x weekly - 10-20 minutes hold - Standing March with Counter Support  - 3 x daily - 7 x weekly - 2 sets - 10 reps - Standing Hip Abduction with Counter Support  - 3 x daily - 7 x weekly - 2 sets - 10 reps - Standing Hip Extension with Counter Support  - 3 x daily - 7 x weekly - 2 sets - 10 reps  ASSESSMENT:  CLINICAL IMPRESSION:  Today: PT session focused lower extremity strengthening to facilitate S2S with large base cane. PT re-introduced gait using large based cane; patient still with moderate unsteadiness on large base cane and requires contact guard assist +chair nearby due to unsteadiness.  Patient will continue to benefit from PT to improve overall QoL and improve safety      09/10/23: Patient a 72 y.o. y.o. male who was seen  today for physical therapy progress note for s/p R AKA. Patient has shown improvements in overall TUG speed, distance, and 5TSTS measures by  ~50%. Patient still presents with deficits in LE strength, ROM, endurance, activity tolerance, gait, balance, and functional mobility with ADL. Patient is having to modify and restrict ADL as indicated by outcome measure score as well as subjective information and objective measures which is affecting overall participation. Patient will benefit from skilled physical therapy in order to improve function and reduce impairment.  OBJECTIVE IMPAIRMENTS: Abnormal gait, decreased activity tolerance, decreased balance, decreased mobility, difficulty walking, decreased ROM, decreased strength, increased muscle spasms, impaired flexibility, and improper body mechanics.   ACTIVITY LIMITATIONS: carrying, lifting, bending, standing, squatting, stairs, transfers, bathing, locomotion level, and caring for others  PARTICIPATION LIMITATIONS: meal prep, cleaning, laundry, shopping, community activity, and yard work  PERSONAL FACTORS: Fitness and 3+ comorbidities: R AKA, HTN, HLD  are also affecting patient's functional outcome.   REHAB POTENTIAL: Good  CLINICAL DECISION MAKING: Stable/uncomplicated  EVALUATION COMPLEXITY: Low   GOALS: Goals reviewed with patient? Yes  SHORT TERM GOALS: Target date: 08/30/2023    Patient will be independent with HEP in order to improve functional outcomes. Baseline:  Goal status: in progress 09/10/23  2.  Patient will report at least 25% improvement in symptoms for improved quality of life. Baseline:  Goal status: in progress 09/10/23    LONG TERM GOALS: Target date: 09/20/2023    Patient will report at least 75% improvement in symptoms for improved quality of life. Baseline:  Goal status: in progress 09/10/23  2.   Patient will demonstrate grade of 5/5 MMT grade in all tested musculature as evidence of improved strength to assist with stair ambulation and gait.   Baseline: see above Goal status: in progress 09/10/23  3.  Patient will be able to ambulate at least  100 feet in  with LRAD in order to demonstrate improved gait speed for community ambulation. Baseline: 24 feet with RW Goal status: in progress 09/10/23  4.  Patient will be able to complete TUG in under 24.3 seconds in order to demonstrate improved mobility to reduce risk for falls. Baseline: 2:18 with RW (includes time to release air from prosthetic upon standing) Goal status: in progress 09/10/23  5.  Patient will be able to complete 5x STS in under 20 seconds in order to demo improved functional strength. Baseline: 53.94 seconds with UE support on chair and RW, relies LLE Goal status: in progress 09/10/23     PLAN:  PT FREQUENCY: 2x/week  PT DURATION: 6 weeks  PLANNED INTERVENTIONS: Therapeutic exercises, Therapeutic activity, Neuromuscular re-education, Balance training, Gait training, Patient/Family education, Joint manipulation, Joint mobilization, Stair training, Orthotic/Fit training, DME instructions, Aquatic Therapy, Dry Needling, Electrical stimulation, Spinal manipulation, Spinal mobilization, Cryotherapy, Moist heat, Compression bandaging, scar mobilization, Splintting, Taping, Traction, Ultrasound, Ionotophoresis 4mg /ml Dexamethasone, and Manual therapy  PLAN FOR NEXT SESSION: hip mobility and hip strength, functional strength, balance and gait training, endurance   Seymour Bars PT, DPT

## 2023-10-04 ENCOUNTER — Ambulatory Visit (HOSPITAL_COMMUNITY): Payer: Medicare Other

## 2023-10-04 DIAGNOSIS — Z89611 Acquired absence of right leg above knee: Secondary | ICD-10-CM

## 2023-10-04 DIAGNOSIS — R2689 Other abnormalities of gait and mobility: Secondary | ICD-10-CM

## 2023-10-04 DIAGNOSIS — M6281 Muscle weakness (generalized): Secondary | ICD-10-CM

## 2023-10-04 NOTE — Therapy (Signed)
OUTPATIENT PHYSICAL THERAPY LOWER EXTREMITY TREATMENT   Patient Name: [name] MRN: [id number] DOB:June 03, 1951, 72 y.o., male Today's Date: 10/04/2023  END OF SESSION:  PT End of Session - 10/04/23 0812     Visit Number 16    Number of Visits 20    Date for PT Re-Evaluation 09/20/23    Authorization Type [employer]    Authorization Time Period 9/26-11/11    Authorization - Number of Visits 12    Progress Note Due on Visit 16    PT Start Time 0802    PT Stop Time 0842    PT Time Calculation (min) 40 min    Activity Tolerance Patient tolerated treatment well    Behavior During Therapy [facility] for tasks assessed/performed                  Past Medical History:  Diagnosis Date   Family history of metabolic acidosis with increased anion gap 03/05/2023   GERD (gastroesophageal reflux disease)    History of kidney stones    Hyperlipidemia    Hypertension    Prostate cancer [facility]) 2011   radiation june 2020   Umbilical hernia    Wears dentures    full   Wears glasses    Wears partial dentures    lower   Past Surgical History:  Procedure Laterality Date   ABDOMINAL AORTOGRAM W/LOWER EXTREMITY N/A 11/20/2022   Procedure: ABDOMINAL AORTOGRAM W/LOWER EXTREMITY;  Surgeon: Maeola Harman, MD;  Location: [facility] INVASIVE CV LAB;  Service: Cardiovascular;  Laterality: N/A;   ABDOMINAL AORTOGRAM W/LOWER EXTREMITY N/A 03/05/2023   Procedure: ABDOMINAL AORTOGRAM W/LOWER EXTREMITY;  Surgeon: Maeola Harman, MD;  Location: [facility] INVASIVE CV LAB;  Service: Cardiovascular;  Laterality: N/A;   AMPUTATION Right 03/09/2023   Procedure: AMPUTATION ABOVE KNEE;  Surgeon: Maeola Harman, MD;  Location: [facility] OR;  Service: Vascular;  Laterality: Right;   COLONOSCOPY N/A 08/25/2020   Procedure: COLONOSCOPY;  Surgeon: Corbin Ade, MD;  [facility];  Service: Endoscopy;  Laterality: N/A;  9:30   COLONOSCOPY     CYSTOSCOPY WITH LITHOLAPAXY N/A  05/18/2021   Procedure: CYSTOSCOPY WITH LITHOLAPAXY;  Surgeon: Rene Paci, MD;  Location: [facility];  Service: Urology;  Laterality: N/A;  ONLY NEEDS  30 MIN   FEMORAL-TIBIAL BYPASS GRAFT Right 12/12/2022   Procedure: RIGHT COMMON FEMORAL-POSTERIOR TIBIAL ARTERY BYPASS WITH VEIN HARVESTING OF THE GREATER SAPHENOUS VEIN AND FEMORAL ENDARTERECTOMY WITH COMPOSITE GRAFT;  Surgeon: Maeola Harman, MD;  Location: [facility] OR;  Service: Vascular;  Laterality: Right;   POLYPECTOMY  08/25/2020   Procedure: POLYPECTOMY;  Surgeon: Corbin Ade, MD;  Location: [facility];  Service: Endoscopy;;   ROBOT ASSISTED LAPAROSCOPIC RADICAL PROSTATECTOMY  2011   TONSILLECTOMY  age 67   and adenoids removed   Patient Active Problem List   Diagnosis Date Noted   Abnormal gait 07/27/2023   Hypertension 07/10/2023   Blister of left leg 05/24/2023   Acute blood loss anemia 03/27/2023   Phantom pain after amputation of lower extremity (HCC) 03/27/2023   Adjustment disorder with mixed anxiety and depressed mood 03/15/2023   Above knee amputation of right lower extremity (HCC) 03/13/2023   CKD (chronic kidney disease) stage 3, GFR 30-59 ml/min (HCC) 03/13/2023   Cellulitis of right lower extremity 03/09/2023   Fever 03/09/2023   Non-traumatic rhabdomyolysis 03/09/2023   Bandemia 03/09/2023   Acute kidney injury superimposed on chronic kidney disease (HCC) 03/05/2023  Hyperkalemia 03/05/2023   Normocytic anemia 03/05/2023   History of prostate cancer 03/05/2023   Essential hypertension 03/05/2023   Metabolic acidosis, increased anion gap 03/05/2023   Critical limb ischemia of right lower extremity (HCC) 12/12/2022   Malignant neoplasm of prostate (HCC) 03/18/2019    PCP: Rica Records FNP  REFERRING PROVIDER: Genice Rouge, MD  REFERRING DIAG: [phone] (ICD-10-CM) - S/P AKA (above knee amputation), right (HCC)  THERAPY DIAG:  No diagnosis  found.  Rationale for Evaluation and Treatment: Rehabilitation  ONSET DATE: 03/09/23  SUBJECTIVE:   SUBJECTIVE STATEMENT: Today: Patient states he feels his his knee "give out" again. Has [facility] appt tomorrow  NPRS today: 5/10 in right lower extremity   Progress Note 09/10/23: Patient has recently been to [facility] to have prosthetic was adjusted; this lasted 2-3 days until the leg began to rotate inwards again. Patient reports still have issues with general mobility and phantom limb pain. The phantom limb pain can get very sharp/intense. Patient has PCP follow up soon.    Eval: Patient states he did a lot of therapy at the hospital following amputation. He has been using RW to get around. He got his prosthetic July 19th. Patient is frustrated with his fatigue, gait speed, dressing difficulties. Is mostly just walking around in the house at this point. Has to take breaks with walking in the house due to fatiuge. He enjoys baking cakes. Has to encourage himself to use leg more. Has not used leg 8 hours yet. Had some wounds prior to amputation. Has some phantom pain.   PERTINENT HISTORY: R AKA 03/09/23, admitted CIR 03/13/23-03/26/23, HTN, HLD, hx prostate cancer PAIN:  Are you having pain? Yes: NPRS scale: 7/10 Pain location: R quad region Pain description: sharp Aggravating factors: constant Relieving factors: movement, meds  PRECAUTIONS: Fall  WEIGHT BEARING RESTRICTIONS: No  FALLS:  Has patient fallen in last 6 months? No  Patient Survey  Lower Extremity Functional Scale (LEFS)  14 / 80 = 17.5 % on 09/10/23    OCCUPATION: Out on leave  PLOF: Independent  PATIENT GOALS: to be walking better like he isn't using a prosthetic     OBJECTIVE: (objective measures from initial evaluation unless otherwise dated)   COGNITION: Overall cognitive status: Within functional limits for tasks assessed     SENSATION: WFL   POSTURE: No Significant postural  limitations  PALPATION: No significant limitations  LOWER EXTREMITY ROM: slightly decreased R hip extension ROM    LOWER EXTREMITY MMT:  MMT Right eval Left eval Right  9/16 Left 9/16  Hip flexion 4 (limited range in seated) 4+ 4 4+  Hip extension 3- 3+ 3 3+  Hip abduction 3- 4- 3 4-  Hip adduction      Hip internal rotation      Hip external rotation      Knee flexion  5  5  Knee extension  5  5  Ankle dorsiflexion  5  5  Ankle plantarflexion      Ankle inversion      Ankle eversion       (Blank rows = not tested) *= pain/symptoms    FUNCTIONAL TESTS:  5 times sit to stand: 53.94 seconds with UE support on chair and RW, relies LLE Timed up and go (TUG): 2: 18 with RW (includes time to release air from prosthetic upon standing) 2 minute walk test: 24 feet with RW  09/09/13  5 times sit to stand: 35s seconds with UE  support on chair and RW, Timed up and go (TUG): 82m22s with rolling walker; includes time to let air out of prosthetic 2 minute walk test:  43 feet with RW  GAIT: Distance walked: 43 feet Assistive device utilized: Walker - 2 wheeled Level of assistance: SBA Comments: , step to pattern *08/16/23: Patient ambulates using a step to gait pattern in rolling walker. Minimal increase in 1) lateral heel whip and 2) circumduction on right lower extremity during swing phase   TODAY'S TREATMENT:                                                                                                                              DATE: 10/04/23 -Standing hip abduction in // bars with 2#  -Partial step ups 2" step in // bars, minimal assist  -Seated hip marches with 2#  -Partial sit to stands     10/01/23 Therapeutic exercise x32' -Seated marches with 2# 2x10  -Standing hip abduction with 2# in // bars Therapeutic Activity x8' -Wide base cane walking throughout session, contact guard assist + chair nearby -S2S from elevated surface to large base cane, contact  guard assist     09/27/23 -Seated bilateral hip isometrics + Mirror for phantom limb pain -S2S on elevated table leaning to right UE  -Gait using large base cane with a 2 point step to pattern, contact guard assist x69feet  PATIENT EDUCATION:  Education details: Patient educated on exam findings, POC, scope of PT, HEP, and prone lying to promote hip extension, skin checks. Person educated: Patient Education method: Explanation, Demonstration, and Handouts Education comprehension: verbalized understanding, returned demonstration, verbal cues required, and tactile cues required  HOME EXERCISE PROGRAM: Access Code: [email or web address] URL: [email or web address]  Date: 08/09/2023 - Prone on Elbows Stretch  - 3 x daily - 7 x weekly - 10-20 minutes hold - Standing March with Counter Support  - 3 x daily - 7 x weekly - 2 sets - 10 reps - Standing Hip Abduction with Counter Support  - 3 x daily - 7 x weekly - 2 sets - 10 reps - Standing Hip Extension with Counter Support  - 3 x daily - 7 x weekly - 2 sets - 10 reps  ASSESSMENT:  CLINICAL IMPRESSION:  Today: PT session focused lower extremity strengthening for Quad activation to facilitate Knee Extension. patient still with moderate unsteadiness requires contact guard assist +chair nearby due to unsteadiness.  Patient will continue to benefit from PT to improve overall QoL and improve safety      09/10/23: Patient a 72 y.o. y.o. male who was seen today for physical therapy progress note for s/p R AKA. Patient has shown improvements in overall TUG speed, distance, and 5TSTS measures by ~50%. Patient still presents with deficits in LE strength, ROM, endurance, activity tolerance, gait, balance, and functional mobility with ADL. Patient is having to modify and restrict ADL as indicated by outcome measure score as well as subjective  information and objective measures which is affecting overall participation. Patient will benefit from skilled  physical therapy in order to improve function and reduce impairment.  OBJECTIVE IMPAIRMENTS: Abnormal gait, decreased activity tolerance, decreased balance, decreased mobility, difficulty walking, decreased ROM, decreased strength, increased muscle spasms, impaired flexibility, and improper body mechanics.   ACTIVITY LIMITATIONS: carrying, lifting, bending, standing, squatting, stairs, transfers, bathing, locomotion level, and caring for others  PARTICIPATION LIMITATIONS: meal prep, cleaning, laundry, shopping, community activity, and yard work  PERSONAL FACTORS: Fitness and 3+ comorbidities: R AKA, HTN, HLD  are also affecting patient's functional outcome.   REHAB POTENTIAL: Good  CLINICAL DECISION MAKING: Stable/uncomplicated  EVALUATION COMPLEXITY: Low   GOALS: Goals reviewed with patient? Yes  SHORT TERM GOALS: Target date: 08/30/2023    Patient will be independent with HEP in order to improve functional outcomes. Baseline:  Goal status: in progress 09/10/23  2.  Patient will report at least 25% improvement in symptoms for improved quality of life. Baseline:  Goal status: in progress 09/10/23    LONG TERM GOALS: Target date: 09/20/2023    Patient will report at least 75% improvement in symptoms for improved quality of life. Baseline:  Goal status: in progress 09/10/23  2.   Patient will demonstrate grade of 5/5 MMT grade in all tested musculature as evidence of improved strength to assist with stair ambulation and gait.   Baseline: see above Goal status: in progress 09/10/23  3.  Patient will be able to ambulate at least 100 feet in  with LRAD in order to demonstrate improved gait speed for community ambulation. Baseline: 24 feet with RW Goal status: in progress 09/10/23  4.  Patient will be able to complete TUG in under 24.3 seconds in order to demonstrate improved mobility to reduce risk for falls. Baseline: 2:18 with RW (includes time to release air from  prosthetic upon standing) Goal status: in progress 09/10/23  5.  Patient will be able to complete 5x STS in under 20 seconds in order to demo improved functional strength. Baseline: 53.94 seconds with UE support on chair and RW, relies LLE Goal status: in progress 09/10/23     PLAN:  PT FREQUENCY: 2x/week  PT DURATION: 6 weeks  PLANNED INTERVENTIONS: Therapeutic exercises, Therapeutic activity, Neuromuscular re-education, Balance training, Gait training, Patient/Family education, Joint manipulation, Joint mobilization, Stair training, Orthotic/Fit training, DME instructions, Aquatic Therapy, Dry Needling, Electrical stimulation, Spinal manipulation, Spinal mobilization, Cryotherapy, Moist heat, Compression bandaging, scar mobilization, Splintting, Taping, Traction, Ultrasound, Ionotophoresis 4mg /ml Dexamethasone, and Manual therapy  PLAN FOR NEXT SESSION: hip mobility and hip strength, functional strength, balance and gait training, endurance   Seymour Bars PT, DPT

## 2023-10-10 ENCOUNTER — Encounter: Payer: Self-pay | Admitting: Family Medicine

## 2023-10-10 ENCOUNTER — Telehealth: Payer: Self-pay

## 2023-10-10 ENCOUNTER — Ambulatory Visit: Payer: Medicare Other | Admitting: Family Medicine

## 2023-10-10 VITALS — BP 130/72 | HR 113 | Resp 16 | Ht 69.5 in | Wt 220.1 lb

## 2023-10-10 DIAGNOSIS — R1904 Left lower quadrant abdominal swelling, mass and lump: Secondary | ICD-10-CM

## 2023-10-10 DIAGNOSIS — R059 Cough, unspecified: Secondary | ICD-10-CM | POA: Insufficient documentation

## 2023-10-10 DIAGNOSIS — I10 Essential (primary) hypertension: Secondary | ICD-10-CM

## 2023-10-10 DIAGNOSIS — R109 Unspecified abdominal pain: Secondary | ICD-10-CM | POA: Insufficient documentation

## 2023-10-10 DIAGNOSIS — R1032 Left lower quadrant pain: Secondary | ICD-10-CM | POA: Diagnosis not present

## 2023-10-10 MED ORDER — METOPROLOL TARTRATE 25 MG PO TABS: 12.5000 mg | ORAL_TABLET | Freq: Two times a day (BID) | ORAL | 0 refills | Status: DC

## 2023-10-10 MED ORDER — BENZONATATE 200 MG PO CAPS: 200.0000 mg | ORAL_CAPSULE | Freq: Two times a day (BID) | ORAL | 0 refills | Status: DC | PRN

## 2023-10-10 MED ORDER — POLYETHYLENE GLYCOL 3350 17 GM/SCOOP PO POWD: 17.0000 g | Freq: Every day | ORAL | 2 refills | Status: AC | PRN

## 2023-10-10 MED ORDER — LISINOPRIL-HYDROCHLOROTHIAZIDE 10-12.5 MG PO TABS: 1.0000 | ORAL_TABLET | Freq: Every day | ORAL | 3 refills | Status: DC

## 2023-10-10 NOTE — Patient Instructions (Signed)

## 2023-10-10 NOTE — Assessment & Plan Note (Addendum)
With left side movable small lump, pain felt on light palpation  Abdominal CT Ordered for further evaluation Can take Miralax 1 cap once daily for constipation  Increase oral fluid intake. Bland diet as tolerated. Avoid fluids that have a lot sugar or caffeine, Avoid spicy or fatty food. Avoid alcohol. Can take OTC tylenol for pain. Follow-up in unable to keep food/fluid down x 24 hours, dizziness, fevers, worsening or persistent symptoms to present to ED or contact primary care provider. Patient verbalizes understanding regarding plan of care and all questions answered.

## 2023-10-10 NOTE — Assessment & Plan Note (Signed)
Patient denies fever, SOB, chills Trial on benzonatate 200 mg PRN at bedtime or OTC robitussin  Advise patient to rest to support your body's recovery. Stay hydrated by drinking water, tea, or broth. Using a humidifier can help soothe throat irritation and ease nasal congestion symptoms. Patient verbalizes understanding regarding plan of care and all questions answered

## 2023-10-10 NOTE — Progress Notes (Signed)
Patient Office Visit   Subjective   Patient ID: [name], male    DOB: Oct 04, 1951  Age: 72 y.o. MRN: [id number]  CC:  Chief Complaint  Patient presents with   Hypertension    Follow up   Cyst    Already has a hernia but he also has a not on the left side of his abdomen that hasn't been there and it can be painful if pressed    Gas    States he has been belching a lot and coughing up phlegm mainly at night x 1 week    HPI [name] 72 year old male, presents to the clinic for chronic follow up. He  has a past medical history of Family history of metabolic acidosis with increased anion gap (03/05/2023), GERD (gastroesophageal reflux disease), History of kidney stones, Hyperlipidemia, Hypertension, Prostate cancer (HCC) (2011), Umbilical hernia, Wears dentures, Wears glasses, and Wears partial dentures.  Abdominal Pain:  The patient presents with a new complaint of abdominal pain that began within the past 7 days, with a gradual onset. The pain is constant and has been progressively worsening. It is localized to the periumbilical region and is rated as 6/10 in severity. The pain is described as aching, cramping, colicky, dull, and burning, without any radiation. Associated symptoms include belching, constipation, gas, and muscle aches (myalgias). The patient denies diarrhea, painful urination (dysuria), fever, headaches, blood in the stool (hematochezia), blood in the urine (hematuria), black tarry stools (melena), nausea, or vomiting. The pain is aggravated by palpation and movement but is relieved by remaining still. The patient has tried antacids without relief.  Cough:  The patient reports a new onset of cough that began 1 to 4 weeks ago. The cough has been waxing and waning and is productive of purulent sputum. Associated symptoms include muscle aches (myalgias). The patient denies fever, headaches, coughing up blood (hemoptysis), nasal congestion, or shortness of  breath. The symptoms are aggravated by lying down. The patient has tried rest and Protonix, but this provided no relief.      Outpatient Encounter Medications as of 10/10/2023  Medication Sig   acetaminophen (TYLENOL) 325 MG tablet Take 1-2 tablets (325-650 mg total) by mouth every 4 (four) hours as needed for mild pain.   aspirin EC 81 MG tablet Take 81 mg by mouth daily.   atorvastatin (LIPITOR) 40 MG tablet Take 1 tablet (40 mg total) by mouth every morning.   benzonatate (TESSALON) 200 MG capsule Take 1 capsule (200 mg total) by mouth 2 (two) times daily as needed for cough.   cyclobenzaprine (FLEXERIL) 5 MG tablet Take 1 tablet (5 mg total) by mouth 3 (three) times daily as needed for muscle spasms.   gabapentin (NEURONTIN) 100 MG capsule Take 1 capsule (100 mg total) by mouth daily.   gabapentin (NEURONTIN) 100 MG capsule Take 2 capsules (200 mg total) by mouth 2 (two) times daily. Along with 300 mg nightly- for phantom pain- due to R AKA   gabapentin (NEURONTIN) 300 MG capsule Take 1 capsule (300 mg total) by mouth at bedtime.   iron polysaccharides (NIFEREX) 150 MG capsule Take 1 capsule (150 mg total) by mouth daily.   levocetirizine (XYZAL) 5 MG tablet Take 1 tablet (5 mg total) by mouth every evening.   Multiple Vitamins-Minerals (CENTRUM SILVER PO) Take 1 tablet by mouth daily.    mupirocin ointment (BACTROBAN) 2 % Apply 1 Application topically 2 (two) times daily.   oxybutynin (DITROPAN-XL) 10  MG 24 hr tablet Take 10 mg by mouth daily.   Oxycodone HCl 10 MG TABS Take 1 tablet (10 mg total) by mouth 3 (three) times daily as needed.   pantoprazole (PROTONIX) 40 MG tablet Take 1 tablet (40 mg total) by mouth daily.   senna-docusate (SENOKOT-S) 8.6-50 MG tablet Take 2 tablets by mouth daily after supper.   sodium bicarbonate 650 MG tablet Take 1 tablet (650 mg total) by mouth 2 (two) times daily.   [DISCONTINUED] lisinopril-hydrochlorothiazide (ZESTORETIC) 10-12.5 MG tablet Take 1  tablet by mouth daily.   [DISCONTINUED] metoprolol tartrate (LOPRESSOR) 25 MG tablet Take 0.5 tablets (12.5 mg total) by mouth 2 (two) times daily. Will give 1 month supply- and get from NP/PCP in future.   [DISCONTINUED] polyethylene glycol powder (MIRALAX) 17 GM/SCOOP powder Take 17 g by mouth daily as needed for mild constipation.   lisinopril-hydrochlorothiazide (ZESTORETIC) 10-12.5 MG tablet Take 1 tablet by mouth daily.   metoprolol tartrate (LOPRESSOR) 25 MG tablet Take 0.5 tablets (12.5 mg total) by mouth 2 (two) times daily. Will give 1 month supply- and get from NP/PCP in future.   polyethylene glycol powder (MIRALAX) 17 GM/SCOOP powder Take 17 g by mouth daily as needed for mild constipation.   No facility-administered encounter medications on file as of 10/10/2023.    Past Surgical History:  Procedure Laterality Date   ABDOMINAL AORTOGRAM W/LOWER EXTREMITY N/A 11/20/2022   Procedure: ABDOMINAL AORTOGRAM W/LOWER EXTREMITY;  Surgeon: Maeola Harman, MD;  Location: [facility] INVASIVE CV LAB;  Service: Cardiovascular;  Laterality: N/A;   ABDOMINAL AORTOGRAM W/LOWER EXTREMITY N/A 03/05/2023   Procedure: ABDOMINAL AORTOGRAM W/LOWER EXTREMITY;  Surgeon: Maeola Harman, MD;  Location: [facility] INVASIVE CV LAB;  Service: Cardiovascular;  Laterality: N/A;   AMPUTATION Right 03/09/2023   Procedure: AMPUTATION ABOVE KNEE;  Surgeon: Maeola Harman, MD;  Location: [facility] OR;  Service: Vascular;  Laterality: Right;   COLONOSCOPY N/A 08/25/2020   Procedure: COLONOSCOPY;  Surgeon: Corbin Ade, MD;  Location: [facility];  Service: Endoscopy;  Laterality: N/A;  9:30   COLONOSCOPY     CYSTOSCOPY WITH LITHOLAPAXY N/A 05/18/2021   Procedure: CYSTOSCOPY WITH LITHOLAPAXY;  Surgeon: Rene Paci, MD;  Location: [facility];  Service: Urology;  Laterality: N/A;  ONLY NEEDS  30 MIN   FEMORAL-TIBIAL BYPASS GRAFT Right 12/12/2022   Procedure: RIGHT COMMON  FEMORAL-POSTERIOR TIBIAL ARTERY BYPASS WITH VEIN HARVESTING OF THE GREATER SAPHENOUS VEIN AND FEMORAL ENDARTERECTOMY WITH COMPOSITE GRAFT;  Surgeon: Maeola Harman, MD;  Location: [facility] OR;  Service: Vascular;  Laterality: Right;   POLYPECTOMY  08/25/2020   Procedure: POLYPECTOMY;  Surgeon: Corbin Ade, MD;  Location: [facility];  Service: Endoscopy;;   ROBOT ASSISTED LAPAROSCOPIC RADICAL PROSTATECTOMY  2011   TONSILLECTOMY  age 66   and adenoids removed    Review of Systems  Constitutional:  Negative for chills and fever.  Eyes:  Negative for blurred vision.  Respiratory:  Positive for cough and sputum production. Negative for hemoptysis, shortness of breath and wheezing.   Cardiovascular:  Negative for chest pain.  Gastrointestinal:  Positive for abdominal pain and constipation. Negative for nausea and vomiting.  Genitourinary:  Negative for dysuria.  Neurological:  Negative for dizziness and headaches.      Objective    BP 130/72   Pulse (!) 113   Resp 16   Ht 5' 9.5" (1.765 m)   Wt 220 lb 1.9 oz (99.8 kg)   SpO2  92%   BMI 32.04 kg/m   Physical Exam Vitals reviewed.  Constitutional:      General: He is not in acute distress.    Appearance: Normal appearance. He is not ill-appearing, toxic-appearing or diaphoretic.  HENT:     Head: Normocephalic.  Eyes:     General:        Right eye: No discharge.        Left eye: No discharge.     Conjunctiva/sclera: Conjunctivae normal.  Cardiovascular:     Rate and Rhythm: Normal rate.     Pulses: Normal pulses.     Heart sounds: Normal heart sounds.  Pulmonary:     Effort: Pulmonary effort is normal. No respiratory distress.     Breath sounds: Normal breath sounds.  Abdominal:     General: Bowel sounds are normal. There is distension.     Palpations: Abdomen is soft. There is mass.     Tenderness: There is abdominal tenderness. There is no right CVA tenderness, left CVA tenderness or guarding.     Hernia: A  hernia is present.  Musculoskeletal:        General: Normal range of motion.     Cervical back: Normal range of motion.  Skin:    General: Skin is warm and dry.     Capillary Refill: Capillary refill takes less than 2 seconds.  Neurological:     Mental Status: He is alert.     Coordination: Coordination normal.       Assessment & Plan:  Primary hypertension -     BMP8+eGFR -     CBC with Differential/Platelet -     Lipid panel  Left lower quadrant abdominal mass -     CT ABDOMEN PELVIS WO CONTRAST; Future  Left lower quadrant abdominal pain Assessment & Plan: With left side movable small lump, pain felt on light palpation  Abdominal CT Ordered for further evaluation Can take Miralax 1 cap once daily for constipation  Increase oral fluid intake. Bland diet as tolerated. Avoid fluids that have a lot sugar or caffeine, Avoid spicy or fatty food. Avoid alcohol. Can take OTC tylenol for pain. Follow-up in unable to keep food/fluid down x 24 hours, dizziness, fevers, worsening or persistent symptoms to present to ED or contact primary care provider. Patient verbalizes understanding regarding plan of care and all questions answered.    Cough, unspecified type Assessment & Plan: Patient denies fever, SOB, chills Trial on benzonatate 200 mg PRN at bedtime or OTC robitussin  Advise patient to rest to support your body's recovery. Stay hydrated by drinking water, tea, or broth. Using a humidifier can help soothe throat irritation and ease nasal congestion symptoms. Patient verbalizes understanding regarding plan of care and all questions answered     Essential hypertension Assessment & Plan: Vitals:   10/10/23 0923 10/10/23 0945  BP: (!) 125/56 130/72  Continue taking metoprolol 12.5 twice daily , and Lisinopril- hydrochlorothiazide 10-12.5 daily Labs ordered. Discussed with  patient to monitor their blood pressure regularly and maintain a heart-healthy diet rich in fruits,  vegetables, whole grains, and low-fat dairy, while reducing sodium intake to less than 2,300 mg per day. Regular physical activity, such as 30 minutes of moderate exercise most days of the week, will help lower blood pressure and improve overall cardiovascular health. Avoiding smoking, limiting alcohol consumption, and managing stress. Take  prescribed medication, & take it as directed and avoid skipping doses. Seek emergency care if your blood pressure is (  over 180/120) or you experience chest pain, shortness of breath, or sudden vision changes.Patient verbalizes understanding regarding plan of care and all questions answered.     Other orders -     Benzonatate; Take 1 capsule (200 mg total) by mouth 2 (two) times daily as needed for cough.  Dispense: 20 capsule; Refill: 0 -     Lisinopril-hydroCHLOROthiazide; Take 1 tablet by mouth daily.  Dispense: 30 tablet; Refill: 3 -     Metoprolol Tartrate; Take 0.5 tablets (12.5 mg total) by mouth 2 (two) times daily. Will give 1 month supply- and get from NP/PCP in future.  Dispense: 30 tablet; Refill: 0 -     Polyethylene Glycol 3350; Take 17 g by mouth daily as needed for mild constipation.  Dispense: 238 g; Refill: 2    Return in about 4 months (around 02/10/2024), or if symptoms worsen or fail to improve, for chronic follow-up.   Cruzita Lederer Newman Nip, FNP

## 2023-10-10 NOTE — Assessment & Plan Note (Signed)
Vitals:   10/10/23 0923 10/10/23 0945  BP: (!) 125/56 130/72  Continue taking metoprolol 12.5 twice daily , and Lisinopril- hydrochlorothiazide 10-12.5 daily Labs ordered. Discussed with  patient to monitor their blood pressure regularly and maintain a heart-healthy diet rich in fruits, vegetables, whole grains, and low-fat dairy, while reducing sodium intake to less than 2,300 mg per day. Regular physical activity, such as 30 minutes of moderate exercise most days of the week, will help lower blood pressure and improve overall cardiovascular health. Avoiding smoking, limiting alcohol consumption, and managing stress. Take  prescribed medication, & take it as directed and avoid skipping doses. Seek emergency care if your blood pressure is (over 180/120) or you experience chest pain, shortness of breath, or sudden vision changes.Patient verbalizes understanding regarding plan of care and all questions answered.

## 2023-10-11 ENCOUNTER — Encounter (HOSPITAL_COMMUNITY): Payer: Medicare Other

## 2023-10-11 ENCOUNTER — Encounter (HOSPITAL_COMMUNITY): Payer: Self-pay | Admitting: Emergency Medicine

## 2023-10-11 ENCOUNTER — Emergency Department (HOSPITAL_COMMUNITY): Payer: Medicare Other

## 2023-10-11 ENCOUNTER — Emergency Department (HOSPITAL_COMMUNITY)
Admission: EM | Admit: 2023-10-11 | Discharge: 2023-10-11 | Disposition: A | Discharge status: Home / Self Care | Payer: Medicare Other

## 2023-10-11 ENCOUNTER — Other Ambulatory Visit: Payer: Self-pay

## 2023-10-11 ENCOUNTER — Ambulatory Visit (HOSPITAL_COMMUNITY): Payer: Medicare Other

## 2023-10-11 ENCOUNTER — Telehealth: Payer: Self-pay | Admitting: Family Medicine

## 2023-10-11 DIAGNOSIS — D649 Anemia, unspecified: Secondary | ICD-10-CM

## 2023-10-11 DIAGNOSIS — Z8546 Personal history of malignant neoplasm of prostate: Secondary | ICD-10-CM | POA: Insufficient documentation

## 2023-10-11 DIAGNOSIS — Z7982 Long term (current) use of aspirin: Secondary | ICD-10-CM | POA: Insufficient documentation

## 2023-10-11 DIAGNOSIS — R9431 Abnormal electrocardiogram [ECG] [EKG]: Secondary | ICD-10-CM | POA: Diagnosis not present

## 2023-10-11 DIAGNOSIS — R0602 Shortness of breath: Secondary | ICD-10-CM | POA: Diagnosis not present

## 2023-10-11 DIAGNOSIS — R079 Chest pain, unspecified: Secondary | ICD-10-CM | POA: Diagnosis present

## 2023-10-11 LAB — COMPREHENSIVE METABOLIC PANEL
ALT: 28 U/L (ref 0–44)
AST: 24 U/L (ref 15–41)
Albumin: 2.4 g/dL — ABNORMAL LOW (ref 3.5–5.0)
Alkaline Phosphatase: 106 U/L (ref 38–126)
Anion gap: 14 (ref 5–15)
BUN: 28 mg/dL — ABNORMAL HIGH (ref 8–23)
CO2: 24 mmol/L (ref 22–32)
Calcium: 9 mg/dL (ref 8.9–10.3)
Chloride: 100 mmol/L (ref 98–111)
Creatinine, Ser: 1.29 mg/dL — ABNORMAL HIGH (ref 0.61–1.24)
GFR, Estimated: 59 mL/min — ABNORMAL LOW (ref >60)
Glucose, Bld: 120 mg/dL — ABNORMAL HIGH (ref 70–99)
Potassium: 4.1 mmol/L (ref 3.5–5.1)
Sodium: 138 mmol/L (ref 135–145)
Total Bilirubin: 0.5 mg/dL (ref 0.3–1.2)
Total Protein: 7.1 g/dL (ref 6.5–8.1)

## 2023-10-11 LAB — CBC WITH DIFFERENTIAL/PLATELET
Abs Immature Granulocytes: 0.06 10*3/uL (ref 0.00–0.07)
Basophils Absolute: 0.1 10*3/uL (ref 0.0–0.2)
Basophils Absolute: 0 10*3/uL (ref 0.0–0.1)
Basophils Relative: 0 %
Basos: 1 %
EOS (ABSOLUTE): 0.2 10*3/uL (ref 0.0–0.4)
Eos: 2 %
Eosinophils Absolute: 0.1 10*3/uL (ref 0.0–0.5)
Eosinophils Relative: 1 %
HCT: 21.4 % — ABNORMAL LOW (ref 39.0–52.0)
Hematocrit: 20.4 % — ABNORMAL LOW (ref 37.5–51.0)
Hemoglobin: 6 g/dL — CL (ref 13.0–17.7)
Hemoglobin: 6.2 g/dL — CL (ref 13.0–17.0)
Immature Grans (Abs): 0 10*3/uL (ref 0.0–0.1)
Immature Granulocytes: 0 %
Immature Granulocytes: 1 %
Lymphocytes Absolute: 1.8 10*3/uL (ref 0.7–3.1)
Lymphocytes Relative: 13 %
Lymphs Abs: 1.4 10*3/uL (ref 0.7–4.0)
Lymphs: 17 %
MCH: 25.9 pg — ABNORMAL LOW (ref 26.6–33.0)
MCH: 25.7 pg — ABNORMAL LOW (ref 26.0–34.0)
MCHC: 29.4 g/dL — ABNORMAL LOW (ref 31.5–35.7)
MCHC: 29 g/dL — ABNORMAL LOW (ref 30.0–36.0)
MCV: 88 fL (ref 79–97)
MCV: 88.8 fL (ref 80.0–100.0)
Monocytes Absolute: 0.8 10*3/uL (ref 0.1–0.9)
Monocytes Absolute: 0.7 10*3/uL (ref 0.1–1.0)
Monocytes Relative: 7 %
Monocytes: 8 %
Neutro Abs: 7.9 10*3/uL — ABNORMAL HIGH (ref 1.7–7.7)
Neutrophils Absolute: 7.6 10*3/uL — ABNORMAL HIGH (ref 1.4–7.0)
Neutrophils Relative %: 78 %
Neutrophils: 72 %
Platelets: 536 10*3/uL — ABNORMAL HIGH (ref 150–450)
Platelets: 468 10*3/uL — ABNORMAL HIGH (ref 150–400)
RBC: 2.32 x10E6/uL — CL (ref 4.14–5.80)
RBC: 2.41 MIL/uL — ABNORMAL LOW (ref 4.22–5.81)
RDW: 17 % — ABNORMAL HIGH (ref 11.6–15.4)
RDW: 19.2 % — ABNORMAL HIGH (ref 11.5–15.5)
WBC: 10.5 10*3/uL (ref 3.4–10.8)
WBC: 10.3 10*3/uL (ref 4.0–10.5)
nRBC: 0 % (ref 0.0–0.2)

## 2023-10-11 LAB — BMP8+EGFR
BUN/Creatinine Ratio: 22 (ref 10–24)
BUN: 29 mg/dL — ABNORMAL HIGH (ref 8–27)
CO2: 23 mmol/L (ref 20–29)
Calcium: 9.6 mg/dL (ref 8.6–10.2)
Chloride: 103 mmol/L (ref 96–106)
Creatinine, Ser: 1.31 mg/dL — ABNORMAL HIGH (ref 0.76–1.27)
Glucose: 118 mg/dL — ABNORMAL HIGH (ref 70–99)
Potassium: 4.7 mmol/L (ref 3.5–5.2)
Sodium: 141 mmol/L (ref 134–144)
eGFR: 57 mL/min/{1.73_m2} — ABNORMAL LOW (ref >59)

## 2023-10-11 LAB — PROTIME-INR
INR: 1.2 (ref 0.8–1.2)
Prothrombin Time: 15.7 s — ABNORMAL HIGH (ref 11.4–15.2)

## 2023-10-11 LAB — LIPID PANEL
Chol/HDL Ratio: 2.5 {ratio} (ref 0.0–5.0)
Cholesterol, Total: 96 mg/dL — ABNORMAL LOW (ref 100–199)
HDL: 38 mg/dL — ABNORMAL LOW (ref >39)
LDL Chol Calc (NIH): 45 mg/dL (ref 0–99)
Triglycerides: 53 mg/dL (ref 0–149)
VLDL Cholesterol Cal: 13 mg/dL (ref 5–40)

## 2023-10-11 LAB — PREPARE RBC (CROSSMATCH)

## 2023-10-11 LAB — POC OCCULT BLOOD, ED: Fecal Occult Bld: NEGATIVE

## 2023-10-11 MED ORDER — SODIUM CHLORIDE 0.9% IV SOLUTION: Freq: Once | INTRAVENOUS | Status: AC

## 2023-10-11 NOTE — ED Triage Notes (Signed)
Pt presents with f/u from PCP for abnormal labs, Hgb 6, needs transfusion.

## 2023-10-11 NOTE — ED Provider Notes (Addendum)
[place] EMERGENCY DEPARTMENT AT [facility] Provider Note   CSN: [id number] Arrival date & time: 10/11/23  0998     History  Chief Complaint  Patient presents with   Abnormal Labs    Hbg    [name] is a 72 y.o. male.  Presented with anemia with a hemoglobin of 6.2.  Asymptomatic.  No overt bleeding.  Takes aspirin, but no other blood thinner.  Denies heavy NSAID use or BC Goody powder.  HPI     Home Medications Prior to Admission medications   Medication Sig Start Date End Date Taking? Authorizing Provider  acetaminophen (TYLENOL) 325 MG tablet Take 1-2 tablets (325-650 mg total) by mouth every 4 (four) hours as needed for mild pain. 03/26/23   Love, Evlyn Kanner, PA-C  albuterol (VENTOLIN HFA) 108 (90 Base) MCG/ACT inhaler Inhale 2 puffs into the lungs every 6 (six) hours as needed for wheezing or shortness of breath. 10/19/23   Del Nigel Berthold, FNP  atorvastatin (LIPITOR) 40 MG tablet Take 1 tablet (40 mg total) by mouth every morning. 04/05/23   Maeola Harman, MD  benzonatate (TESSALON) 200 MG capsule Take 1 capsule (200 mg total) by mouth 2 (two) times daily as needed for cough. 10/10/23   Del Nigel Berthold, FNP  cyclobenzaprine (FLEXERIL) 5 MG tablet Take 1 tablet (5 mg total) by mouth 3 (three) times daily as needed for muscle spasms. 04/25/23   Lovorn, Aundra Millet, MD  gabapentin (NEURONTIN) 100 MG capsule Take 2 capsules (200 mg total) by mouth 2 (two) times daily. Along with 300 mg nightly- for phantom pain- due to R AKA 11/16/23   Lovorn, Aundra Millet, MD  gabapentin (NEURONTIN) 300 MG capsule Take 1 capsule by mouth at bedtime Patient not taking: Reported on 11/28/2023 10/30/23   Genice Rouge, MD  iron polysaccharides (NIFEREX) 150 MG capsule Take 1 capsule (150 mg total) by mouth daily. 10/19/23   Del Nigel Berthold, FNP  levocetirizine (XYZAL) 5 MG tablet Take 1 tablet (5 mg total) by mouth every evening. 05/24/23   Del Nigel Berthold, FNP  lisinopril-hydrochlorothiazide (ZESTORETIC) 10-12.5 MG tablet Take 1 tablet by mouth daily. 10/10/23   Del Nigel Berthold, FNP  metoprolol tartrate (LOPRESSOR) 25 MG tablet Take 0.5 tablets (12.5 mg total) by mouth 2 (two) times daily. Will give 1 month supply- and get from NP/PCP in future. 10/10/23   Del Nigel Berthold, FNP  Multiple Vitamins-Minerals (CENTRUM SILVER PO) Take 1 tablet by mouth daily.     [provider]  oxybutynin (DITROPAN-XL) 10 MG 24 hr tablet Take 10 mg by mouth daily. 04/12/23   [provider]  Oxycodone HCl 10 MG TABS Take 1 tablet (10 mg total) by mouth 3 (three) times daily as needed. Patient taking differently: Take 10 mg by mouth 3 (three) times daily as needed (for pain). 03/26/23   Love, Evlyn Kanner, PA-C  pantoprazole (PROTONIX) 40 MG tablet Take 1 tablet (40 mg total) by mouth daily. 03/26/23   Love, Evlyn Kanner, PA-C  polyethylene glycol powder (MIRALAX) 17 GM/SCOOP powder Take 17 g by mouth daily as needed for mild constipation. 10/10/23   Del Nigel Berthold, FNP  senna-docusate (SENOKOT-S) 8.6-50 MG tablet Take 2 tablets by mouth daily after supper. 03/26/23   Love, Evlyn Kanner, PA-C  sodium bicarbonate 650 MG tablet Take 1 tablet (650 mg total) by mouth 2 (two) times daily. 03/26/23   Jacquelynn Cree, PA-C  Allergies    Shellfish allergy    Review of Systems   Review of Systems  Physical Exam Updated Vital Signs BP (!) 146/50   Pulse 92   Temp 97.8 F (36.6 C) (Oral)   Resp 18   Ht 5\' 10"  (1.778 m)   Wt 99.8 kg   SpO2 98%   BMI 31.57 kg/m  Physical Exam Vitals and nursing note reviewed.  HENT:     Head: Normocephalic and atraumatic.     Nose: Nose normal.  Cardiovascular:     Rate and Rhythm: Normal rate and regular rhythm.  Pulmonary:     Effort: Pulmonary effort is normal.     Breath sounds: Normal breath sounds.  Abdominal:     General: There is no distension.     Palpations: Abdomen is soft.      Tenderness: There is no abdominal tenderness. There is no guarding or rebound.     Hernia: A hernia is present.  Musculoskeletal:        General: Normal range of motion.  Skin:    General: Skin is warm and dry.     Capillary Refill: Capillary refill takes less than 2 seconds.  Neurological:     Mental Status: He is alert.  Psychiatric:        Behavior: Behavior normal.     ED Results / Procedures / Treatments   Labs (all labs ordered are listed, but only abnormal results are displayed) Labs Reviewed  COMPREHENSIVE METABOLIC PANEL - Abnormal; Notable for the following components:      Result Value   Glucose, Bld 120 (*)    BUN 28 (*)    Creatinine, Ser 1.29 (*)    Albumin 2.4 (*)    GFR, Estimated 59 (*)    All other components within normal limits  CBC WITH DIFFERENTIAL/PLATELET - Abnormal; Notable for the following components:   RBC 2.41 (*)    Hemoglobin 6.2 (*)    HCT 21.4 (*)    MCH 25.7 (*)    MCHC 29.0 (*)    RDW 19.2 (*)    Platelets 468 (*)    Neutro Abs 7.9 (*)    All other components within normal limits  PROTIME-INR - Abnormal; Notable for the following components:   Prothrombin Time 15.7 (*)    All other components within normal limits  POC OCCULT BLOOD, ED  TYPE AND SCREEN  PREPARE RBC (CROSSMATCH)  BLOOD TRANSFUSION REPORT - SCANNED   Narrative:    Ordered by an unspecified provider.    EKG EKG Interpretation Date/Time:  Thursday October 11 2023 09:48:32 EDT Ventricular Rate:  90 PR Interval:  142 QRS Duration:  100 QT Interval:  380 QTC Calculation: 464 R Axis:   -37  Text Interpretation: Normal sinus rhythm Left axis deviation Abnormal ECG When compared with ECG of 20-Nov-2022 10:38, T wave inversion now evident in Anterior leads Confirmed by Estanislado Pandy [phone]) on 10/11/2023 4:22:51 PM  Radiology No results found.  Procedures .Critical Care  Performed by: Coral Spikes, DO Authorized by: Coral Spikes, DO   Critical care  provider statement:    Critical care time (minutes):  30   Critical care time was exclusive of:  Separately billable procedures and treating other patients   Critical care was necessary to treat or prevent imminent or life-threatening deterioration of the following conditions:  Circulatory failure   Critical care was time spent personally by me on the following activities:  Blood draw  for specimens, examination of patient, evaluation of patient's response to treatment, interpretation of cardiac output measurements, obtaining history from patient or surrogate, pulse oximetry, re-evaluation of patient's condition, review of old charts and ordering and review of laboratory studies     Medications Ordered in ED Medications  0.9 %  sodium chloride infusion (Manually program via Guardrails IV Fluids) (0 mLs Intravenous Stopped 10/11/23 1714)    ED Course/ Medical Decision Making/ A&P Clinical Course as of 12/08/23 1624  Thu Oct 11, 2023  1148 Per chart review had Colonscopy sept 2021 with three sesille polyps removed.  [TY]    Clinical Course User Index [TY] Coral Spikes, DO                                 Medical Decision Making Well-appearing 72 year old male present emergency department for anemia after outpatient blood test from PCP.  Afebrile, hemodynamically stable, mild elevation in heart rate on triage vitals, however on monitor patient in the 90s normal sinus rhythm once roomed.  He is asymptomatic and has no complaints.  No overt source of blood loss.  Will get Hemoccult.  Will transfuse and observe. Care signed out to afternoon team. Dispo after transfusion. Likely d/c with pcp follow up given patient is asymptomatic with negative hemacult.   Amount and/or Complexity of Data Reviewed Labs: ordered. Radiology: ordered.  Risk Prescription drug management.          Final Clinical Impression(s) / ED Diagnoses Final diagnoses:  Anemia, unspecified type    Rx / DC  Orders ED Discharge Orders     None         Coral Spikes, DO 10/19/23 1505    Coral Spikes, DO 12/08/23 1624

## 2023-10-11 NOTE — Telephone Encounter (Signed)
Iliana reviewed lab, pt was informed and advised to go to ED per [facility] due to critical lab.

## 2023-10-11 NOTE — Telephone Encounter (Signed)
Received after call summary through fax report lab results. Called provider on call (gloria zarwolo) said left a messgage. Call summary put in provider box. [phone] tech name misha called

## 2023-10-11 NOTE — Discharge Instructions (Signed)
Please follow-up with your cancer doctor at [facility], call for [facility] in the morning to make an appointment, this will be very important to find out the cause of your anemia, you will need to have your blood work checked in 1 week but I want you to come back to the emergency department for any severe or worsening symptoms.

## 2023-10-11 NOTE — ED Provider Notes (Signed)
I excepted this patient at change of shift at 4:00 PM, this patient presented with low hemoglobin at 6.2, no history of significant anemia, evidently was asymptomatic and at this time has no symptoms whatsoever.  His heart rate is 90, blood pressure 146 systolic, he is not febrile or hypoxic and has no other symptoms.  He is on a baby aspirin which I have asked him to discontinue at this time.  He has an oncologist for his history of prostate cancer and will follow-up with them for repeat testing of his anemia and evaluation of the cause.  The patient is otherwise stable for discharge, he has been given 2 units of packed red blood cells, he is agreeable to return should symptoms worsen.   Eber Hong, MD 10/11/23 [phone]

## 2023-10-12 LAB — TYPE AND SCREEN
ABO/RH(D): O POS
Antibody Screen: NEGATIVE
Unit division: 0
Unit division: 0

## 2023-10-12 LAB — BPAM RBC
Blood Product Expiration Date: 202411122359
Blood Product Expiration Date: 202411122359
ISSUE DATE / TIME: 202410171305
ISSUE DATE / TIME: 202410171532
Unit Type and Rh: 5100
Unit Type and Rh: 5100

## 2023-10-15 ENCOUNTER — Encounter (HOSPITAL_COMMUNITY): Payer: Medicare Other

## 2023-10-15 NOTE — Telephone Encounter (Signed)
Patient called left a voicemail still waiting to speak to nurse from our office about his blood transfusion he had done. Asking for a call back at [phone].

## 2023-10-17 NOTE — Telephone Encounter (Signed)
error 

## 2023-10-18 ENCOUNTER — Ambulatory Visit (HOSPITAL_COMMUNITY): Payer: Medicare Other

## 2023-10-18 DIAGNOSIS — R2689 Other abnormalities of gait and mobility: Secondary | ICD-10-CM | POA: Diagnosis not present

## 2023-10-18 DIAGNOSIS — Z89611 Acquired absence of right leg above knee: Secondary | ICD-10-CM

## 2023-10-18 DIAGNOSIS — M6281 Muscle weakness (generalized): Secondary | ICD-10-CM | POA: Diagnosis not present

## 2023-10-18 NOTE — Progress Notes (Signed)
Established Patient Office Visit   Subjective  Patient ID: [name], male    DOB: 1951-09-06  Age: 72 y.o. MRN: [id number]  Chief Complaint  Patient presents with   Follow-up    Blood Transfusion    He  has a past medical history of Family history of metabolic acidosis with increased anion gap (03/05/2023), GERD (gastroesophageal reflux disease), History of kidney stones, Hyperlipidemia, Hypertension, Prostate cancer (HCC) (2011), Umbilical hernia, Wears dentures, Wears glasses, and Wears partial dentures.  HPI  The patient presents to the clinic for a follow-up appointment after a recent ER visit for anemia. For the details of today's visit, please refer to assessment and plan.   Review of Systems  Constitutional:  Negative for chills and fever.  Eyes:  Negative for blurred vision.  Respiratory:  Negative for shortness of breath.   Cardiovascular:  Negative for chest pain.  Neurological:  Negative for dizziness and headaches.      Objective:     BP 115/68   Pulse (!) 103   Ht 5\' 10"  (1.778 m)   Wt 220 lb (99.8 kg)   SpO2 96%   BMI 31.57 kg/m  BP Readings from Last 3 Encounters:  10/19/23 115/68  10/11/23 (!) 146/50  10/10/23 130/72      Physical Exam Vitals reviewed.  Constitutional:      General: He is not in acute distress.    Appearance: Normal appearance. He is not ill-appearing, toxic-appearing or diaphoretic.  HENT:     Head: Normocephalic.  Eyes:     General:        Right eye: No discharge.        Left eye: No discharge.     Conjunctiva/sclera: Conjunctivae normal.  Cardiovascular:     Rate and Rhythm: Normal rate.     Pulses: Normal pulses.     Heart sounds: Normal heart sounds.  Pulmonary:     Effort: Pulmonary effort is normal. No respiratory distress.     Breath sounds: Normal breath sounds.  Musculoskeletal:     Cervical back: Normal range of motion.  Skin:    General: Skin is warm and dry.     Capillary Refill: Capillary  refill takes less than 2 seconds.  Neurological:     Mental Status: He is alert and oriented to person, place, and time.     Coordination: Coordination normal.     Gait: Gait normal.  Psychiatric:        Mood and Affect: Mood normal.      No results found for any visits on 10/19/23.  The ASCVD Risk score (Arnett DK, et al., 2019) failed to calculate for the following reasons:   The valid total cholesterol range is 130 to 320 mg/dL    Assessment & Plan:  Anemia, unspecified type Assessment & Plan: Hemoglobin 6.2 on 10.17 Since that time, patient has received two units of packed red blood cells. A repeat CBC has been ordered for today, and Niferex 150 mg has been refilled. The patient is advised to discontinue Aspirin, and a referral to hematology has been placed for further evaluation and management. Advise to focus on a diet rich in iron, such as leafy greens, beans, and lean meats, and include vitamin C to boost iron absorption. Staying well-hydrated and getting adequate rest can also help reduce fatigue and improve energy levels.   Orders: -     CBC with Differential/Platelet -     Ambulatory referral to Hematology /  Oncology  Other orders -     Polysaccharide Iron Complex; Take 1 capsule (150 mg total) by mouth daily.  Dispense: 30 capsule; Refill: 3 -     Albuterol Sulfate HFA; Inhale 2 puffs into the lungs every 6 (six) hours as needed for wheezing or shortness of breath.  Dispense: 8 g; Refill: 2    Return if symptoms worsen or fail to improve.   Cruzita Lederer Newman Nip, FNP

## 2023-10-18 NOTE — Patient Instructions (Signed)

## 2023-10-18 NOTE — Therapy (Signed)
OUTPATIENT PHYSICAL THERAPY LOWER EXTREMITY TREATMENT   Patient Name: [name] MRN: [id number] DOB:1951-12-10, 72 y.o., male Today's Date: 10/18/2023  END OF SESSION:  PT End of Session - 10/18/23 0810     Visit Number 17    Number of Visits 20    Date for PT Re-Evaluation 09/20/23    Authorization Type [employer] Medicare    Authorization Time Period 9/26-11/11    Authorization - Number of Visits 12    Progress Note Due on Visit 16    PT Start Time 0800    PT Stop Time 0840    PT Time Calculation (min) 40 min    Activity Tolerance Patient tolerated treatment well    Behavior During Therapy [facility] for tasks assessed/performed                   Past Medical History:  Diagnosis Date   Family history of metabolic acidosis with increased anion gap 03/05/2023   GERD (gastroesophageal reflux disease)    History of kidney stones    Hyperlipidemia    Hypertension    Prostate cancer [facility]) 2011   radiation june 2020   Umbilical hernia    Wears dentures    full   Wears glasses    Wears partial dentures    lower   Past Surgical History:  Procedure Laterality Date   ABDOMINAL AORTOGRAM W/LOWER EXTREMITY N/A 11/20/2022   Procedure: ABDOMINAL AORTOGRAM W/LOWER EXTREMITY;  Surgeon: Maeola Harman, MD;  Location: [facility] INVASIVE CV LAB;  Service: Cardiovascular;  Laterality: N/A;   ABDOMINAL AORTOGRAM W/LOWER EXTREMITY N/A 03/05/2023   Procedure: ABDOMINAL AORTOGRAM W/LOWER EXTREMITY;  Surgeon: Maeola Harman, MD;  Location: [facility] INVASIVE CV LAB;  Service: Cardiovascular;  Laterality: N/A;   AMPUTATION Right 03/09/2023   Procedure: AMPUTATION ABOVE KNEE;  Surgeon: Maeola Harman, MD;  Location: [facility] OR;  Service: Vascular;  Laterality: Right;   COLONOSCOPY N/A 08/25/2020   Procedure: COLONOSCOPY;  Surgeon: Corbin Ade, MD;  [facility];  Service: Endoscopy;  Laterality: N/A;  9:30   COLONOSCOPY     CYSTOSCOPY WITH LITHOLAPAXY N/A  05/18/2021   Procedure: CYSTOSCOPY WITH LITHOLAPAXY;  Surgeon: Rene Paci, MD;  Location: [facility];  Service: Urology;  Laterality: N/A;  ONLY NEEDS  30 MIN   FEMORAL-TIBIAL BYPASS GRAFT Right 12/12/2022   Procedure: RIGHT COMMON FEMORAL-POSTERIOR TIBIAL ARTERY BYPASS WITH VEIN HARVESTING OF THE GREATER SAPHENOUS VEIN AND FEMORAL ENDARTERECTOMY WITH COMPOSITE GRAFT;  Surgeon: Maeola Harman, MD;  Location: [facility] OR;  Service: Vascular;  Laterality: Right;   POLYPECTOMY  08/25/2020   Procedure: POLYPECTOMY;  Surgeon: Corbin Ade, MD;  Location: [facility];  Service: Endoscopy;;   ROBOT ASSISTED LAPAROSCOPIC RADICAL PROSTATECTOMY  2011   TONSILLECTOMY  age 44   and adenoids removed   Patient Active Problem List   Diagnosis Date Noted   Abdominal pain 10/10/2023   Cough 10/10/2023   Abnormal gait 07/27/2023   Hypertension 07/10/2023   Blister of left leg 05/24/2023   Acute blood loss anemia 03/27/2023   Phantom pain after amputation of lower extremity (HCC) 03/27/2023   Adjustment disorder with mixed anxiety and depressed mood 03/15/2023   Above knee amputation of right lower extremity (HCC) 03/13/2023   CKD (chronic kidney disease) stage 3, GFR 30-59 ml/min (HCC) 03/13/2023   Cellulitis of right lower extremity 03/09/2023   Fever 03/09/2023   Non-traumatic rhabdomyolysis 03/09/2023   Bandemia 03/09/2023  Acute kidney injury superimposed on chronic kidney disease (HCC) 03/05/2023   Hyperkalemia 03/05/2023   Normocytic anemia 03/05/2023   History of prostate cancer 03/05/2023   Essential hypertension 03/05/2023   Metabolic acidosis, increased anion gap 03/05/2023   Critical limb ischemia of right lower extremity (HCC) 12/12/2022   Malignant neoplasm of prostate (HCC) 03/18/2019    PCP: Rica Records FNP  REFERRING PROVIDER: Genice Rouge, MD  REFERRING DIAG: [phone] (ICD-10-CM) - S/P AKA (above knee amputation), right  (HCC)  THERAPY DIAG:  No diagnosis found.  Rationale for Evaluation and Treatment: Rehabilitation  ONSET DATE: 03/09/23  SUBJECTIVE:   SUBJECTIVE STATEMENT:  Today: Patient had to undergo transfusion 10/17; Patient also went to [facility] for more prosthetic adjustments. NPRS 0/10 today.   Progress Note 09/10/23: Patient has recently been to [facility] to have prosthetic was adjusted; this lasted 2-3 days until the leg began to rotate inwards again. Patient reports still have issues with general mobility and phantom limb pain. The phantom limb pain can get very sharp/intense. Patient has PCP follow up soon.    Eval: Patient states he did a lot of therapy at the hospital following amputation. He has been using RW to get around. He got his prosthetic July 19th. Patient is frustrated with his fatigue, gait speed, dressing difficulties. Is mostly just walking around in the house at this point. Has to take breaks with walking in the house due to fatiuge. He enjoys baking cakes. Has to encourage himself to use leg more. Has not used leg 8 hours yet. Had some wounds prior to amputation. Has some phantom pain.   PERTINENT HISTORY: R AKA 03/09/23, admitted CIR 03/13/23-03/26/23, HTN, HLD, hx prostate cancer PAIN:  Are you having pain? Yes: NPRS scale: 7/10 Pain location: R quad region Pain description: sharp Aggravating factors: constant Relieving factors: movement, meds  PRECAUTIONS: Fall  WEIGHT BEARING RESTRICTIONS: No  FALLS:  Has patient fallen in last 6 months? No  Patient Survey  Lower Extremity Functional Scale (LEFS)  14 / 80 = 17.5 % on 09/10/23    OCCUPATION: Out on leave  PLOF: Independent  PATIENT GOALS: to be walking better like he isn't using a prosthetic     OBJECTIVE: (objective measures from initial evaluation unless otherwise dated)   COGNITION: Overall cognitive status: Within functional limits for tasks assessed     SENSATION: WFL   POSTURE: No  Significant postural limitations  PALPATION: No significant limitations  LOWER EXTREMITY ROM: slightly decreased R hip extension ROM    LOWER EXTREMITY MMT:  MMT Right eval Left eval Right  9/16 Left 9/16  Hip flexion 4 (limited range in seated) 4+ 4 4+  Hip extension 3- 3+ 3 3+  Hip abduction 3- 4- 3 4-  Hip adduction      Hip internal rotation      Hip external rotation      Knee flexion  5  5  Knee extension  5  5  Ankle dorsiflexion  5  5  Ankle plantarflexion      Ankle inversion      Ankle eversion       (Blank rows = not tested) *= pain/symptoms    FUNCTIONAL TESTS:  5 times sit to stand: 53.94 seconds with UE support on chair and RW, relies LLE Timed up and go (TUG): 2: 18 with RW (includes time to release air from prosthetic upon standing) 2 minute walk test: 24 feet with RW  09/09/13  5 times sit to stand: 35s seconds with UE support on chair and RW, Timed up and go (TUG): 21m22s with rolling walker; includes time to let air out of prosthetic 2 minute walk test:  43 feet with RW  GAIT: Distance walked: 43 feet Assistive device utilized: Walker - 2 wheeled Level of assistance: SBA Comments: , step to pattern *08/16/23: Patient ambulates using a step to gait pattern in rolling walker. Minimal increase in 1) lateral heel whip and 2) circumduction on right lower extremity during swing phase   TODAY'S TREATMENT:                                                                                                                              DATE:  10/18/23 Standing in // bars  Bilateral hip abduction with 3# 2x10 Bilateral hip flexion + weight shifts with 3# 2x10  S2S + weight shift of elevated surface   Tandem wt shift onto right lower extremity into Stance Phase \ Seated  Hip marches with 3# 2x10      10/04/23 -Standing hip abduction in // bars with 2#  -Partial step ups 2" step in // bars, minimal assist  -Seated hip marches with 2#  -Partial sit  to stands     10/01/23 Therapeutic exercise x32' -Seated marches with 2# 2x10  -Standing hip abduction with 2# in // bars Therapeutic Activity x8' -Wide base cane walking throughout session, contact guard assist + chair nearby -S2S from elevated surface to large base cane, contact guard assist     09/27/23 -Seated bilateral hip isometrics + Mirror for phantom limb pain -S2S on elevated table leaning to right UE  -Gait using large base cane with a 2 point step to pattern, contact guard assist x32feet  PATIENT EDUCATION:  Education details: Patient educated on exam findings, POC, scope of PT, HEP, and prone lying to promote hip extension, skin checks. Person educated: Patient Education method: Explanation, Demonstration, and Handouts Education comprehension: verbalized understanding, returned demonstration, verbal cues required, and tactile cues required  HOME EXERCISE PROGRAM: Access Code: [email or web address] URL: [email or web address]  Date: 08/09/2023 - Prone on Elbows Stretch  - 3 x daily - 7 x weekly - 10-20 minutes hold - Standing March with Counter Support  - 3 x daily - 7 x weekly - 2 sets - 10 reps - Standing Hip Abduction with Counter Support  - 3 x daily - 7 x weekly - 2 sets - 10 reps - Standing Hip Extension with Counter Support  - 3 x daily - 7 x weekly - 2 sets - 10 reps  ASSESSMENT:  CLINICAL IMPRESSION:  Today: Patient received in the waiting room + rolling walker. PT session initially focused on therapeutic exercise targeted at hip strength with minor weight shifts introduced. The second half of the session included larger weight shifts in the parallel bars to facilitate right hip extension into needed for the stance phase of gait. Patient requires minimal  verbal/tactile cues for proper weight shift.   09/10/23: Patient a 72 y.o. y.o. male who was seen today for physical therapy progress note for s/p R AKA. Patient has shown improvements in overall TUG speed,  distance, and 5TSTS measures by ~50%. Patient still presents with deficits in LE strength, ROM, endurance, activity tolerance, gait, balance, and functional mobility with ADL. Patient is having to modify and restrict ADL as indicated by outcome measure score as well as subjective information and objective measures which is affecting overall participation. Patient will benefit from skilled physical therapy in order to improve function and reduce impairment.  OBJECTIVE IMPAIRMENTS: Abnormal gait, decreased activity tolerance, decreased balance, decreased mobility, difficulty walking, decreased ROM, decreased strength, increased muscle spasms, impaired flexibility, and improper body mechanics.   ACTIVITY LIMITATIONS: carrying, lifting, bending, standing, squatting, stairs, transfers, bathing, locomotion level, and caring for others  PARTICIPATION LIMITATIONS: meal prep, cleaning, laundry, shopping, community activity, and yard work  PERSONAL FACTORS: Fitness and 3+ comorbidities: R AKA, HTN, HLD  are also affecting patient's functional outcome.   REHAB POTENTIAL: Good  CLINICAL DECISION MAKING: Stable/uncomplicated  EVALUATION COMPLEXITY: Low   GOALS: Goals reviewed with patient? Yes  SHORT TERM GOALS: Target date: 08/30/2023    Patient will be independent with HEP in order to improve functional outcomes. Baseline:  Goal status: in progress 09/10/23  2.  Patient will report at least 25% improvement in symptoms for improved quality of life. Baseline:  Goal status: in progress 09/10/23    LONG TERM GOALS: Target date: 09/20/2023    Patient will report at least 75% improvement in symptoms for improved quality of life. Baseline:  Goal status: in progress 09/10/23  2.   Patient will demonstrate grade of 5/5 MMT grade in all tested musculature as evidence of improved strength to assist with stair ambulation and gait.   Baseline: see above Goal status: in progress 09/10/23  3.   Patient will be able to ambulate at least 100 feet in  with LRAD in order to demonstrate improved gait speed for community ambulation. Baseline: 24 feet with RW Goal status: in progress 09/10/23  4.  Patient will be able to complete TUG in under 24.3 seconds in order to demonstrate improved mobility to reduce risk for falls. Baseline: 2:18 with RW (includes time to release air from prosthetic upon standing) Goal status: in progress 09/10/23  5.  Patient will be able to complete 5x STS in under 20 seconds in order to demo improved functional strength. Baseline: 53.94 seconds with UE support on chair and RW, relies LLE Goal status: in progress 09/10/23     PLAN:  PT FREQUENCY: 2x/week  PT DURATION: 6 weeks  PLANNED INTERVENTIONS: Therapeutic exercises, Therapeutic activity, Neuromuscular re-education, Balance training, Gait training, Patient/Family education, Joint manipulation, Joint mobilization, Stair training, Orthotic/Fit training, DME instructions, Aquatic Therapy, Dry Needling, Electrical stimulation, Spinal manipulation, Spinal mobilization, Cryotherapy, Moist heat, Compression bandaging, scar mobilization, Splintting, Taping, Traction, Ultrasound, Ionotophoresis 4mg /ml Dexamethasone, and Manual therapy  PLAN FOR NEXT SESSION: hip mobility and hip strength, functional strength, balance and gait training, endurance   Seymour Bars PT, DPT

## 2023-10-19 ENCOUNTER — Ambulatory Visit (INDEPENDENT_AMBULATORY_CARE_PROVIDER_SITE_OTHER): Payer: Medicare Other | Admitting: Family Medicine

## 2023-10-19 ENCOUNTER — Encounter: Payer: Self-pay | Admitting: Family Medicine

## 2023-10-19 VITALS — BP 115/68 | HR 103 | Ht 70.0 in | Wt 220.0 lb

## 2023-10-19 DIAGNOSIS — D649 Anemia, unspecified: Secondary | ICD-10-CM | POA: Diagnosis not present

## 2023-10-19 MED ORDER — ALBUTEROL SULFATE HFA 108 (90 BASE) MCG/ACT IN AERS: 2.0000 | INHALATION_SPRAY | Freq: Four times a day (QID) | RESPIRATORY_TRACT | 2 refills | Status: AC | PRN

## 2023-10-19 MED ORDER — POLYSACCHARIDE IRON COMPLEX 150 MG PO CAPS: 150.0000 mg | ORAL_CAPSULE | Freq: Every day | ORAL | 3 refills | Status: DC

## 2023-10-19 NOTE — Assessment & Plan Note (Addendum)
Hemoglobin 6.2 on 10.17 Since that time, patient has received two units of packed red blood cells. A repeat CBC has been ordered for today, and Niferex 150 mg has been refilled. The patient is advised to discontinue Aspirin, and a referral to hematology has been placed for further evaluation and management. Advise to focus on a diet rich in iron, such as leafy greens, beans, and lean meats, and include vitamin C to boost iron absorption. Staying well-hydrated and getting adequate rest can also help reduce fatigue and improve energy levels.

## 2023-10-22 ENCOUNTER — Ambulatory Visit (HOSPITAL_COMMUNITY): Payer: Medicare Other

## 2023-10-22 DIAGNOSIS — Z89611 Acquired absence of right leg above knee: Secondary | ICD-10-CM

## 2023-10-22 DIAGNOSIS — M6281 Muscle weakness (generalized): Secondary | ICD-10-CM

## 2023-10-22 DIAGNOSIS — D649 Anemia, unspecified: Secondary | ICD-10-CM | POA: Diagnosis not present

## 2023-10-22 DIAGNOSIS — R2689 Other abnormalities of gait and mobility: Secondary | ICD-10-CM | POA: Diagnosis not present

## 2023-10-22 NOTE — Therapy (Signed)
OUTPATIENT PHYSICAL THERAPY LOWER EXTREMITY TREATMENT   Patient Name: [name] MRN: [id number] DOB:17-Aug-1951, 72 y.o., male Today's Date: 10/22/2023  END OF SESSION:  PT End of Session - 10/22/23 1037     Visit Number 18    Number of Visits 20    Date for PT Re-Evaluation 09/20/23    Authorization Type [employer]    Authorization Time Period 9/26-11/11    Authorization - Number of Visits 12    Progress Note Due on Visit 16    PT Start Time 0805    PT Stop Time 0845    PT Time Calculation (min) 40 min    Activity Tolerance Patient tolerated treatment well;Patient limited by fatigue    Behavior During Therapy [facility] for tasks assessed/performed                   Past Medical History:  Diagnosis Date   Family history of metabolic acidosis with increased anion gap 03/05/2023   GERD (gastroesophageal reflux disease)    History of kidney stones    Hyperlipidemia    Hypertension    Prostate cancer [facility]) 2011   radiation june 2020   Umbilical hernia    Wears dentures    full   Wears glasses    Wears partial dentures    lower   Past Surgical History:  Procedure Laterality Date   ABDOMINAL AORTOGRAM W/LOWER EXTREMITY N/A 11/20/2022   Procedure: ABDOMINAL AORTOGRAM W/LOWER EXTREMITY;  Surgeon: Maeola Harman, MD;  Location: [facility] INVASIVE CV LAB;  Service: Cardiovascular;  Laterality: N/A;   ABDOMINAL AORTOGRAM W/LOWER EXTREMITY N/A 03/05/2023   Procedure: ABDOMINAL AORTOGRAM W/LOWER EXTREMITY;  Surgeon: Maeola Harman, MD;  Location: [facility] INVASIVE CV LAB;  Service: Cardiovascular;  Laterality: N/A;   AMPUTATION Right 03/09/2023   Procedure: AMPUTATION ABOVE KNEE;  Surgeon: Maeola Harman, MD;  Location: [facility] OR;  Service: Vascular;  Laterality: Right;   COLONOSCOPY N/A 08/25/2020   Procedure: COLONOSCOPY;  Surgeon: Corbin Ade, MD;  [facility];  Service: Endoscopy;  Laterality: N/A;  9:30   COLONOSCOPY      CYSTOSCOPY WITH LITHOLAPAXY N/A 05/18/2021   Procedure: CYSTOSCOPY WITH LITHOLAPAXY;  Surgeon: Rene Paci, MD;  Location: [facility];  Service: Urology;  Laterality: N/A;  ONLY NEEDS  30 MIN   FEMORAL-TIBIAL BYPASS GRAFT Right 12/12/2022   Procedure: RIGHT COMMON FEMORAL-POSTERIOR TIBIAL ARTERY BYPASS WITH VEIN HARVESTING OF THE GREATER SAPHENOUS VEIN AND FEMORAL ENDARTERECTOMY WITH COMPOSITE GRAFT;  Surgeon: Maeola Harman, MD;  Location: [facility] OR;  Service: Vascular;  Laterality: Right;   POLYPECTOMY  08/25/2020   Procedure: POLYPECTOMY;  Surgeon: Corbin Ade, MD;  Location: [facility];  Service: Endoscopy;;   ROBOT ASSISTED LAPAROSCOPIC RADICAL PROSTATECTOMY  2011   TONSILLECTOMY  age 68   and adenoids removed   Patient Active Problem List   Diagnosis Date Noted   Anemia 10/19/2023   Abdominal pain 10/10/2023   Cough 10/10/2023   Abnormal gait 07/27/2023   Hypertension 07/10/2023   Blister of left leg 05/24/2023   Acute blood loss anemia 03/27/2023   Phantom pain after amputation of lower extremity (HCC) 03/27/2023   Adjustment disorder with mixed anxiety and depressed mood 03/15/2023   Above knee amputation of right lower extremity (HCC) 03/13/2023   CKD (chronic kidney disease) stage 3, GFR 30-59 ml/min (HCC) 03/13/2023   Cellulitis of right lower extremity 03/09/2023   Fever 03/09/2023   Non-traumatic rhabdomyolysis  03/09/2023   Bandemia 03/09/2023   Acute kidney injury superimposed on chronic kidney disease (HCC) 03/05/2023   Hyperkalemia 03/05/2023   Normocytic anemia 03/05/2023   History of prostate cancer 03/05/2023   Essential hypertension 03/05/2023   Metabolic acidosis, increased anion gap 03/05/2023   Critical limb ischemia of right lower extremity (HCC) 12/12/2022   Malignant neoplasm of prostate (HCC) 03/18/2019    PCP: Rica Records FNP  REFERRING PROVIDER: Genice Rouge, MD  REFERRING DIAG: [phone]  (ICD-10-CM) - S/P AKA (above knee amputation), right (HCC)  THERAPY DIAG:  Other abnormalities of gait and mobility  Muscle weakness (generalized)  S/P AKA (above knee amputation), right (HCC)  Rationale for Evaluation and Treatment: Rehabilitation  ONSET DATE: 03/09/23  SUBJECTIVE:   SUBJECTIVE STATEMENT:  Today: Patient with no new complaints; NPRS 2/10 today.   Progress Note 09/10/23: Patient has recently been to [facility] to have prosthetic was adjusted; this lasted 2-3 days until the leg began to rotate inwards again. Patient reports still have issues with general mobility and phantom limb pain. The phantom limb pain can get very sharp/intense. Patient has PCP follow up soon.    Eval: Patient states he did a lot of therapy at the hospital following amputation. He has been using RW to get around. He got his prosthetic July 19th. Patient is frustrated with his fatigue, gait speed, dressing difficulties. Is mostly just walking around in the house at this point. Has to take breaks with walking in the house due to fatiuge. He enjoys baking cakes. Has to encourage himself to use leg more. Has not used leg 8 hours yet. Had some wounds prior to amputation. Has some phantom pain.   PERTINENT HISTORY: R AKA 03/09/23, admitted CIR 03/13/23-03/26/23, HTN, HLD, hx prostate cancer PAIN:  Are you having pain? Yes: NPRS scale: 7/10 Pain location: R quad region Pain description: sharp Aggravating factors: constant Relieving factors: movement, meds  PRECAUTIONS: Fall  WEIGHT BEARING RESTRICTIONS: No  FALLS:  Has patient fallen in last 6 months? No  Patient Survey  Lower Extremity Functional Scale (LEFS)  14 / 80 = 17.5 % on 09/10/23    OCCUPATION: Out on leave  PLOF: Independent  PATIENT GOALS: to be walking better like he isn't using a prosthetic     OBJECTIVE: (objective measures from initial evaluation unless otherwise dated)   COGNITION: Overall cognitive status: Within  functional limits for tasks assessed     SENSATION: WFL   POSTURE: No Significant postural limitations  PALPATION: No significant limitations  LOWER EXTREMITY ROM: slightly decreased R hip extension ROM    LOWER EXTREMITY MMT:  MMT Right eval Left eval Right  9/16 Left 9/16  Hip flexion 4 (limited range in seated) 4+ 4 4+  Hip extension 3- 3+ 3 3+  Hip abduction 3- 4- 3 4-  Hip adduction      Hip internal rotation      Hip external rotation      Knee flexion  5  5  Knee extension  5  5  Ankle dorsiflexion  5  5  Ankle plantarflexion      Ankle inversion      Ankle eversion       (Blank rows = not tested) *= pain/symptoms    FUNCTIONAL TESTS:  5 times sit to stand: 53.94 seconds with UE support on chair and RW, relies LLE Timed up and go (TUG): 2: 18 with RW (includes time to release air from prosthetic upon  standing) 2 minute walk test: 24 feet with RW  09/09/13  5 times sit to stand: 35s seconds with UE support on chair and RW, Timed up and go (TUG): 73m22s with rolling walker; includes time to let air out of prosthetic 2 minute walk test:  43 feet with RW  GAIT: Distance walked: 43 feet Assistive device utilized: Walker - 2 wheeled Level of assistance: SBA Comments: , step to pattern *08/16/23: Patient ambulates using a step to gait pattern in rolling walker. Minimal increase in 1) lateral heel whip and 2) circumduction on right lower extremity during swing phase   TODAY'S TREATMENT:                                                                                                                              DATE:  10/18/23 Standing in // bars  S2S + weight shift of elevated surface   Tandem wt shift onto right lower extremity into Stance Phase Lateral walks in // bars   Seated  Hip marches with 3# 2x10 Gait Training   2 pt step-to with large base cane, PT contact guard        10/18/23 Standing in // bars  Bilateral hip abduction with 3#  2x10 Bilateral hip flexion + weight shifts with 3# 2x10  S2S + weight shift of elevated surface   Tandem wt shift onto right lower extremity into Stance Phase \ Seated  Hip marches with 3# 2x10      10/04/23 -Standing hip abduction in // bars with 2#  -Partial step ups 2" step in // bars, minimal assist  -Seated hip marches with 2#  -Partial sit to stands     10/01/23 Therapeutic exercise x32' -Seated marches with 2# 2x10  -Standing hip abduction with 2# in // bars Therapeutic Activity x8' -Wide base cane walking throughout session, contact guard assist + chair nearby -S2S from elevated surface to large base cane, contact guard assist     09/27/23 -Seated bilateral hip isometrics + Mirror for phantom limb pain -S2S on elevated table leaning to right UE  -Gait using large base cane with a 2 point step to pattern, contact guard assist x48feet  PATIENT EDUCATION:  Education details: Patient educated on exam findings, POC, scope of PT, HEP, and prone lying to promote hip extension, skin checks. Person educated: Patient Education method: Explanation, Demonstration, and Handouts Education comprehension: verbalized understanding, returned demonstration, verbal cues required, and tactile cues required  HOME EXERCISE PROGRAM: Access Code: [email or web address] URL: [email or web address]  Date: 08/09/2023 - Prone on Elbows Stretch  - 3 x daily - 7 x weekly - 10-20 minutes hold - Standing March with Counter Support  - 3 x daily - 7 x weekly - 2 sets - 10 reps - Standing Hip Abduction with Counter Support  - 3 x daily - 7 x weekly - 2 sets - 10 reps - Standing Hip Extension with Counter Support  - 3 x daily -  7 x weekly - 2 sets - 10 reps  ASSESSMENT:  CLINICAL IMPRESSION:  Today: Patient received in the waiting room + rolling walker. PT session initially focused on proper weight shifting in // bars as a pre-gait activity. Session then introduced lateral walks with weight shifts  die to side. PT concluded session walking ~63ft with large base cane; contact guard assist with chair nearby. Patient shows imrpoved step length with cane. Patient tolerated well with intermittent seated breaks due to fatigue.   Patient requires minimal verbal/tactile cues for proper weight shift.   09/10/23: Patient a 72 y.o. y.o. male who was seen today for physical therapy progress note for s/p R AKA. Patient has shown improvements in overall TUG speed, distance, and 5TSTS measures by ~50%. Patient still presents with deficits in LE strength, ROM, endurance, activity tolerance, gait, balance, and functional mobility with ADL. Patient is having to modify and restrict ADL as indicated by outcome measure score as well as subjective information and objective measures which is affecting overall participation. Patient will benefit from skilled physical therapy in order to improve function and reduce impairment.  OBJECTIVE IMPAIRMENTS: Abnormal gait, decreased activity tolerance, decreased balance, decreased mobility, difficulty walking, decreased ROM, decreased strength, increased muscle spasms, impaired flexibility, and improper body mechanics.   ACTIVITY LIMITATIONS: carrying, lifting, bending, standing, squatting, stairs, transfers, bathing, locomotion level, and caring for others  PARTICIPATION LIMITATIONS: meal prep, cleaning, laundry, shopping, community activity, and yard work  PERSONAL FACTORS: Fitness and 3+ comorbidities: R AKA, HTN, HLD  are also affecting patient's functional outcome.   REHAB POTENTIAL: Good  CLINICAL DECISION MAKING: Stable/uncomplicated  EVALUATION COMPLEXITY: Low   GOALS: Goals reviewed with patient? Yes  SHORT TERM GOALS: Target date: 08/30/2023    Patient will be independent with HEP in order to improve functional outcomes. Baseline:  Goal status: in progress 09/10/23  2.  Patient will report at least 25% improvement in symptoms for improved quality of  life. Baseline:  Goal status: in progress 09/10/23    LONG TERM GOALS: Target date: 09/20/2023    Patient will report at least 75% improvement in symptoms for improved quality of life. Baseline:  Goal status: in progress 09/10/23  2.   Patient will demonstrate grade of 5/5 MMT grade in all tested musculature as evidence of improved strength to assist with stair ambulation and gait.   Baseline: see above Goal status: in progress 09/10/23  3.  Patient will be able to ambulate at least 100 feet in  with LRAD in order to demonstrate improved gait speed for community ambulation. Baseline: 24 feet with RW Goal status: in progress 09/10/23  4.  Patient will be able to complete TUG in under 24.3 seconds in order to demonstrate improved mobility to reduce risk for falls. Baseline: 2:18 with RW (includes time to release air from prosthetic upon standing) Goal status: in progress 09/10/23  5.  Patient will be able to complete 5x STS in under 20 seconds in order to demo improved functional strength. Baseline: 53.94 seconds with UE support on chair and RW, relies LLE Goal status: in progress 09/10/23     PLAN:  PT FREQUENCY: 2x/week  PT DURATION: 6 weeks  PLANNED INTERVENTIONS: Therapeutic exercises, Therapeutic activity, Neuromuscular re-education, Balance training, Gait training, Patient/Family education, Joint manipulation, Joint mobilization, Stair training, Orthotic/Fit training, DME instructions, Aquatic Therapy, Dry Needling, Electrical stimulation, Spinal manipulation, Spinal mobilization, Cryotherapy, Moist heat, Compression bandaging, scar mobilization, Splintting, Taping, Traction, Ultrasound, Ionotophoresis 4mg /ml Dexamethasone, and  Manual therapy  PLAN FOR NEXT SESSION: hip mobility and hip strength, functional strength, balance and gait training, endurance   Seymour Bars PT, DPT

## 2023-10-22 NOTE — Telephone Encounter (Signed)
Encounter made in error. 

## 2023-10-22 NOTE — Progress Notes (Signed)
[facility] [address], [place] [address]   Clinic Day:  10/23/2023  Referring physician: Wylene Men*  Patient Care Team: Del Nigel Berthold, FNP as PCP - General (Family Medicine) Felicita Gage, RN Nurse Navigator as Registered Nurse (Medical Oncology)   ASSESSMENT & PLAN:   Assessment:  1.  Normocytic anemia: - Recent transfusion 2 units on 10/11/2023 with hemoglobin 6.2, aspirin stopped.  Prior to that he received 2 units PRBC in March 2024 when he had right lower extremity amputation. - Denies BRBPR/melena.  Stool negative for blood on 10/11/2023. - Colonoscopy (08/25/2020): 4-5 mm polyps in the ascending colon. - He is on iron tablet daily since March. - History of prostate cancer, status post prostatectomy, status post XRT for local recurrence in 2020.  2. Social/Family History: -Lives at home alone. Retired Child psychotherapist. He does not drive and uses a walker or wheelchair to ambulate. No chemical exposure. Quit tobacco use November 2023 1 pack per week since age 22. -No family history of anemia, thalassemia, or sickle cell. Mother had stomach cancer. Father had lung cancer.   Plan:  1.  Normocytic anemia: - Will repeat CBC and check for nutritional deficiencies and bone marrow infiltrative process. - Will check for hemolysis.  Will also send serum Keppra level. - I have also talked to him about need for bone marrow aspiration and biopsy if his anemia is not explained by about testing.  He is agreeable.  We will arrange it with radiology in [place]. - We will arrange for 1 unit PRBC.  RTC 2 weeks after the biopsy.   Orders Placed This Encounter  Procedures   Iron and TIBC ([facility] ONLY)    Standing Status:   Future    Number of Occurrences:   1    Standing Expiration Date:   10/22/2024   Ferritin    Standing Status:   Future    Number of Occurrences:   1    Standing Expiration Date:   10/22/2024    Vitamin B12    Standing Status:   Future    Number of Occurrences:   1    Standing Expiration Date:   10/22/2024   Folate    Standing Status:   Future    Number of Occurrences:   1    Standing Expiration Date:   10/22/2024   Reticulocytes    Standing Status:   Future    Number of Occurrences:   1    Standing Expiration Date:   10/22/2024   Methylmalonic acid, serum    Standing Status:   Future    Number of Occurrences:   1    Standing Expiration Date:   10/22/2024   Haptoglobin    Standing Status:   Future    Number of Occurrences:   1    Standing Expiration Date:   10/22/2024   Copper, serum    Standing Status:   Future    Number of Occurrences:   1    Standing Expiration Date:   10/22/2024   Kappa/lambda light chains    Standing Status:   Future    Number of Occurrences:   1    Standing Expiration Date:   10/22/2024   Immunofixation electrophoresis    Standing Status:   Future    Number of Occurrences:   1    Standing Expiration Date:   10/22/2024   Protein electrophoresis, serum    Standing  Status:   Future    Number of Occurrences:   1    Standing Expiration Date:   10/22/2024   Erythropoietin    Standing Status:   Future    Number of Occurrences:   1    Standing Expiration Date:   10/22/2024   Lactate dehydrogenase    Standing Status:   Future    Number of Occurrences:   1    Standing Expiration Date:   10/22/2024   PSA    Standing Status:   Future    Number of Occurrences:   1    Standing Expiration Date:   10/22/2024   Direct antiglobulin test    Standing Status:   Future    Number of Occurrences:   1    Standing Expiration Date:   10/22/2024   Sample to Blood Bank(Blood Bank Hold)    Standing Status:   Future    Number of Occurrences:   1    Standing Expiration Date:   10/22/2024      Mikeal Hawthorne R Teague,acting as a scribe for Doreatha Massed, MD.,have documented all relevant documentation on the behalf of Doreatha Massed, MD,as directed by   Doreatha Massed, MD while in the presence of Doreatha Massed, MD.   I, Doreatha Massed MD, have reviewed the above documentation for accuracy and completeness, and I agree with the above.   Doreatha Massed, MD   10/29/20245:58 PM  CHIEF COMPLAINT/PURPOSE OF CONSULT:   Diagnosis: Normocytic anemia  Current Therapy: Transfusion as needed, oral iron therapy  HISTORY OF PRESENT ILLNESS:   [name] is a 72 y.o. male presenting to clinic today for evaluation of anemia at the request of Del Nigel Berthold, FNP.  Brentley was seen in the ED on 10/11/23 for anemia with HGB at 6.2. He was given 2 units of PRBC and discharged. ASA was discontinued during his ED stay.   Today, he states that he is doing well overall. His appetite level is at 100%. His energy level is at 80%. He is accompanied by his cousin.  He was found to have abnormal CBC from 10/11/23 with low RBC at 2.41, low HGB at 6.2, low HCT at 21.4, low MCH at 25.7, low MCHC at 29.0, elevated RDW at 19.2, elevated platelets at 468, and elevated absolute neutrophils at 7.9. Patient was unaware he has been anemic since September 2023. He denies any BRBPR or melena. He has been taking iron supplements since March 2024. Patient had a right AKA on 03/09/23 from an ischemia with blood circulation at 35%. He currently uses a walker or wheelchair to ambulate. He has a prior history of prostate cancer s/p prostatectomy with 30 treatments of XRT in 2020. His last colonoscopy was in 2021 with Dr. Jena Gauss.   PAST MEDICAL HISTORY:   Past Medical History: Past Medical History:  Diagnosis Date   Family history of metabolic acidosis with increased anion gap 03/05/2023   GERD (gastroesophageal reflux disease)    History of kidney stones    Hyperlipidemia    Hypertension    Prostate cancer [facility]) 2011   radiation june 2020   Umbilical hernia    Wears dentures    full   Wears glasses    Wears partial dentures    lower     Surgical History: Past Surgical History:  Procedure Laterality Date   ABDOMINAL AORTOGRAM W/LOWER EXTREMITY N/A 11/20/2022   Procedure: ABDOMINAL AORTOGRAM W/LOWER EXTREMITY;  Surgeon: Maeola Harman, MD;  Location: [facility]  INVASIVE CV LAB;  Service: Cardiovascular;  Laterality: N/A;   ABDOMINAL AORTOGRAM W/LOWER EXTREMITY N/A 03/05/2023   Procedure: ABDOMINAL AORTOGRAM W/LOWER EXTREMITY;  Surgeon: Maeola Harman, MD;  Location: [facility] INVASIVE CV LAB;  Service: Cardiovascular;  Laterality: N/A;   AMPUTATION Right 03/09/2023   Procedure: AMPUTATION ABOVE KNEE;  Surgeon: Maeola Harman, MD;  Location: [facility] OR;  Service: Vascular;  Laterality: Right;   COLONOSCOPY N/A 08/25/2020   Procedure: COLONOSCOPY;  Surgeon: Corbin Ade, MD;  [facility];  Service: Endoscopy;  Laterality: N/A;  9:30   COLONOSCOPY     CYSTOSCOPY WITH LITHOLAPAXY N/A 05/18/2021   Procedure: CYSTOSCOPY WITH LITHOLAPAXY;  Surgeon: Rene Paci, MD;  Location: [facility];  Service: Urology;  Laterality: N/A;  ONLY NEEDS  30 MIN   FEMORAL-TIBIAL BYPASS GRAFT Right 12/12/2022   Procedure: RIGHT COMMON FEMORAL-POSTERIOR TIBIAL ARTERY BYPASS WITH VEIN HARVESTING OF THE GREATER SAPHENOUS VEIN AND FEMORAL ENDARTERECTOMY WITH COMPOSITE GRAFT;  Surgeon: Maeola Harman, MD;  Location: [facility] OR;  Service: Vascular;  Laterality: Right;   POLYPECTOMY  08/25/2020   Procedure: POLYPECTOMY;  Surgeon: Corbin Ade, MD;  [facility];  Service: Endoscopy;;   ROBOT ASSISTED LAPAROSCOPIC RADICAL PROSTATECTOMY  2011   TONSILLECTOMY  age 10   and adenoids removed    Social History: Social History   Socioeconomic History   Marital status: Single    Spouse name: Not on file   Number of children: 1   Years of education: Not on file   Highest education level: Some college, no degree  Occupational History    Comment: customer service   Tobacco Use    Smoking status: Former    Current packs/day: 0.00    Average packs/day: 0.3 packs/day for 21.0 years (5.3 ttl pk-yrs)    Types: Cigarettes    Start date: 11/29/2001    Quit date: 11/29/2022    Years since quitting: 0.8    Passive exposure: Never   Smokeless tobacco: Never  Vaping Use   Vaping status: Never Used  Substance and Sexual Activity   Alcohol use: Never   Drug use: Never   Sexual activity: Not Currently  Other Topics Concern   Not on file  Social History Narrative   Has one son.    Social Determinants of Health   Financial Resource Strain: Medium Risk (10/09/2023)   Overall Financial Resource Strain (CARDIA)    Difficulty of Paying Living Expenses: Somewhat hard  Food Insecurity: Food Insecurity Present (10/09/2023)   Hunger Vital Sign    Worried About Running Out of Food in the Last Year: Sometimes true    Ran Out of Food in the Last Year: Sometimes true  Transportation Needs: No Transportation Needs (10/09/2023)   PRAPARE - Administrator, Civil Service (Medical): No    Lack of Transportation (Non-Medical): No  Physical Activity: Unknown (10/09/2023)   Exercise Vital Sign    Days of Exercise per Week: 0 days    Minutes of Exercise per Session: Not on file  Stress: No Stress Concern Present (10/09/2023)   [employer] of Occupational Health - Occupational Stress Questionnaire    Feeling of Stress : Not at all  Social Connections: Moderately Integrated (10/09/2023)   Social Connection and Isolation Panel [NHANES]    Frequency of Communication with Friends and Family: More than three times a week    Frequency of Social Gatherings with Friends and Family: Never  Attends Religious Services: 1 to 4 times per year    Active Member of Clubs or Organizations: Yes    Attends Banker Meetings: Patient declined    Marital Status: Never married  Intimate Partner Violence: Not At Risk (12/12/2022)   Humiliation, Afraid, Rape, and Kick  questionnaire    Fear of Current or Ex-Partner: No    Emotionally Abused: No    Physically Abused: No    Sexually Abused: No    Family History: Family History  Problem Relation Age of Onset   Gastric cancer Mother    Prostate cancer Father    Breast cancer Neg Hx    Pancreatic cancer Neg Hx     Current Medications:  Current Outpatient Medications:    acetaminophen (TYLENOL) 325 MG tablet, Take 1-2 tablets (325-650 mg total) by mouth every 4 (four) hours as needed for mild pain., Disp: , Rfl:    albuterol (VENTOLIN HFA) 108 (90 Base) MCG/ACT inhaler, Inhale 2 puffs into the lungs every 6 (six) hours as needed for wheezing or shortness of breath., Disp: 8 g, Rfl: 2   aspirin EC 81 MG tablet, Take 81 mg by mouth daily., Disp: , Rfl:    atorvastatin (LIPITOR) 40 MG tablet, Take 1 tablet (40 mg total) by mouth every morning., Disp: 30 tablet, Rfl: 0   benzonatate (TESSALON) 200 MG capsule, Take 1 capsule (200 mg total) by mouth 2 (two) times daily as needed for cough., Disp: 20 capsule, Rfl: 0   cyclobenzaprine (FLEXERIL) 5 MG tablet, Take 1 tablet (5 mg total) by mouth 3 (three) times daily as needed for muscle spasms., Disp: 90 tablet, Rfl: 5   gabapentin (NEURONTIN) 100 MG capsule, Take 1 capsule (100 mg total) by mouth daily., Disp: 30 capsule, Rfl: 5   gabapentin (NEURONTIN) 100 MG capsule, Take 2 capsules (200 mg total) by mouth 2 (two) times daily. Along with 300 mg nightly- for phantom pain- due to R AKA, Disp: 120 capsule, Rfl: 5   gabapentin (NEURONTIN) 300 MG capsule, Take 1 capsule (300 mg total) by mouth at bedtime., Disp: 30 capsule, Rfl: 5   iron polysaccharides (NIFEREX) 150 MG capsule, Take 1 capsule (150 mg total) by mouth daily., Disp: 30 capsule, Rfl: 3   levocetirizine (XYZAL) 5 MG tablet, Take 1 tablet (5 mg total) by mouth every evening., Disp: 15 tablet, Rfl: 1   lisinopril-hydrochlorothiazide (ZESTORETIC) 10-12.5 MG tablet, Take 1 tablet by mouth daily., Disp: 30  tablet, Rfl: 3   metoprolol tartrate (LOPRESSOR) 25 MG tablet, Take 0.5 tablets (12.5 mg total) by mouth 2 (two) times daily. Will give 1 month supply- and get from NP/PCP in future., Disp: 30 tablet, Rfl: 0   Multiple Vitamins-Minerals (CENTRUM SILVER PO), Take 1 tablet by mouth daily. , Disp: , Rfl:    mupirocin ointment (BACTROBAN) 2 %, Apply 1 Application topically 2 (two) times daily., Disp: 30 g, Rfl: 3   oxybutynin (DITROPAN-XL) 10 MG 24 hr tablet, Take 10 mg by mouth daily., Disp: , Rfl:    Oxycodone HCl 10 MG TABS, Take 1 tablet (10 mg total) by mouth 3 (three) times daily as needed., Disp: 2 tablet, Rfl: 0   pantoprazole (PROTONIX) 40 MG tablet, Take 1 tablet (40 mg total) by mouth daily., Disp: 30 tablet, Rfl: 0   polyethylene glycol powder (MIRALAX) 17 GM/SCOOP powder, Take 17 g by mouth daily as needed for mild constipation., Disp: 238 g, Rfl: 2   senna-docusate (SENOKOT-S) 8.6-50 MG tablet,  Take 2 tablets by mouth daily after supper., Disp: 30 tablet, Rfl: 0   sodium bicarbonate 650 MG tablet, Take 1 tablet (650 mg total) by mouth 2 (two) times daily., Disp: 60 tablet, Rfl: 0   Allergies: Allergies  Allergen Reactions   Shellfish Allergy Swelling    REVIEW OF SYSTEMS:   Review of Systems  Constitutional:  Negative for chills, fatigue and fever.  HENT:   Negative for lump/mass, mouth sores, nosebleeds, sore throat and trouble swallowing.   Eyes:  Negative for eye problems.  Respiratory:  Positive for shortness of breath. Negative for cough.   Cardiovascular:  Negative for chest pain, leg swelling and palpitations.  Gastrointestinal:  Positive for constipation. Negative for abdominal pain, diarrhea, nausea and vomiting.  Genitourinary:  Negative for bladder incontinence, difficulty urinating, dysuria, frequency, hematuria and nocturia.   Musculoskeletal:  Negative for arthralgias, back pain, flank pain, myalgias and neck pain.       +phantom pain in right leg  Skin:  Negative  for itching and rash.  Neurological:  Negative for dizziness, headaches and numbness.  Hematological:  Does not bruise/bleed easily.  Psychiatric/Behavioral:  Negative for depression, sleep disturbance and suicidal ideas. The patient is not nervous/anxious.   All other systems reviewed and are negative.    VITALS:   Blood pressure 95/74, pulse 91, temperature 99.2 F (37.3 C), temperature source Oral, resp. rate 16, height 5' 9.5" (1.765 m), weight 218 lb 12.8 oz (99.2 kg), SpO2 96%.  Wt Readings from Last 3 Encounters:  10/23/23 218 lb 12.8 oz (99.2 kg)  10/19/23 220 lb (99.8 kg)  10/11/23 220 lb (99.8 kg)    Body mass index is 31.85 kg/m.   PHYSICAL EXAM:   Physical Exam Vitals and nursing note reviewed. Exam conducted with a chaperone present.  Constitutional:      Appearance: Normal appearance.  Cardiovascular:     Rate and Rhythm: Normal rate and regular rhythm.     Pulses: Normal pulses.     Heart sounds: Normal heart sounds.  Pulmonary:     Effort: Pulmonary effort is normal.     Breath sounds: Normal breath sounds.  Abdominal:     Palpations: Abdomen is soft. There is no hepatomegaly, splenomegaly or mass.     Tenderness: There is no abdominal tenderness.  Musculoskeletal:     Right lower leg: No edema.     Left lower leg: No edema.  Lymphadenopathy:     Cervical: No cervical adenopathy.     Right cervical: No superficial, deep or posterior cervical adenopathy.    Left cervical: No superficial, deep or posterior cervical adenopathy.     Upper Body:     Right upper body: No supraclavicular or axillary adenopathy.     Left upper body: No supraclavicular or axillary adenopathy.  Neurological:     General: No focal deficit present.     Mental Status: He is alert and oriented to person, place, and time.  Psychiatric:        Mood and Affect: Mood normal.        Behavior: Behavior normal.     LABS:      Latest Ref Rng & Units 10/22/2023    9:03 AM  10/11/2023   10:25 AM 10/10/2023   10:10 AM  CBC  WBC 3.4 - 10.8 x10E3/uL 9.7  10.3  10.5   Hemoglobin 13.0 - 17.7 g/dL 7.5  6.2  6.0   Hematocrit 37.5 - 51.0 % 24.4  21.4  20.4   Platelets 150 - 450 x10E3/uL 500  468  536       Latest Ref Rng & Units 10/11/2023   10:25 AM 10/10/2023   10:10 AM 07/10/2023   10:02 AM  CMP  Glucose 70 - 99 mg/dL 409  811  914   BUN 8 - 23 mg/dL 28  29  37   Creatinine 0.61 - 1.24 mg/dL 7.82  9.56  2.13   Sodium 135 - 145 mmol/L 138  141  141   Potassium 3.5 - 5.1 mmol/L 4.1  4.7  4.7   Chloride 98 - 111 mmol/L 100  103  102   CO2 22 - 32 mmol/L 24  23  23    Calcium 8.9 - 10.3 mg/dL 9.0  9.6  9.8   Total Protein 6.5 - 8.1 g/dL 7.1     Total Bilirubin 0.3 - 1.2 mg/dL 0.5     Alkaline Phos 38 - 126 U/L 106     AST 15 - 41 U/L 24     ALT 0 - 44 U/L 28        No results found for: "CEA1", "CEA" / No results found for: "CEA1", "CEA" Lab Results  Component Value Date   PSA1 <0.1 06/26/2019   No results found for: "YQM578" No results found for: "CAN125"  No results found for: "TOTALPROTELP", "ALBUMINELP", "A1GS", "A2GS", "BETS", "BETA2SER", "GAMS", "MSPIKE", "SPEI" Lab Results  Component Value Date   TIBC 162 (L) 10/23/2023   TIBC 172 (L) 03/06/2023   FERRITIN 3,280 (H) 10/23/2023   FERRITIN 621 (H) 03/06/2023   IRONPCTSAT 17 (L) 10/23/2023   IRONPCTSAT 46 (H) 03/06/2023   Lab Results  Component Value Date   LDH 190 10/23/2023     STUDIES:   DG Chest 2 View  Result Date: 10/11/2023 CLINICAL DATA:  Shortness of breath. EXAM: CHEST - 2 VIEW COMPARISON:  10/19/2009. FINDINGS: Bilateral lung fields are clear. Bilateral costophrenic angles are clear. Normal cardio-mediastinal silhouette. No acute osseous abnormalities. The soft tissues are within normal limits. IMPRESSION: No active cardiopulmonary disease. Electronically Signed   By: Jules Schick M.D.   On: 10/11/2023 13:19

## 2023-10-23 ENCOUNTER — Inpatient Hospital Stay: Payer: Medicare Other

## 2023-10-23 ENCOUNTER — Inpatient Hospital Stay: Payer: Medicare Other | Attending: Hematology | Admitting: Hematology

## 2023-10-23 ENCOUNTER — Other Ambulatory Visit: Payer: Self-pay

## 2023-10-23 ENCOUNTER — Encounter: Payer: Self-pay | Admitting: Hematology

## 2023-10-23 VITALS — BP 95/74 | HR 91 | Temp 99.2°F | Resp 16 | Ht 69.5 in | Wt 218.8 lb

## 2023-10-23 DIAGNOSIS — Z87891 Personal history of nicotine dependence: Secondary | ICD-10-CM | POA: Insufficient documentation

## 2023-10-23 DIAGNOSIS — Z8546 Personal history of malignant neoplasm of prostate: Secondary | ICD-10-CM | POA: Insufficient documentation

## 2023-10-23 DIAGNOSIS — Z9079 Acquired absence of other genital organ(s): Secondary | ICD-10-CM | POA: Insufficient documentation

## 2023-10-23 DIAGNOSIS — Z801 Family history of malignant neoplasm of trachea, bronchus and lung: Secondary | ICD-10-CM | POA: Insufficient documentation

## 2023-10-23 DIAGNOSIS — Z800 Family history of malignant neoplasm of digestive organs: Secondary | ICD-10-CM | POA: Insufficient documentation

## 2023-10-23 DIAGNOSIS — C61 Malignant neoplasm of prostate: Secondary | ICD-10-CM

## 2023-10-23 DIAGNOSIS — Z923 Personal history of irradiation: Secondary | ICD-10-CM | POA: Insufficient documentation

## 2023-10-23 DIAGNOSIS — D649 Anemia, unspecified: Secondary | ICD-10-CM

## 2023-10-23 LAB — CBC WITH DIFFERENTIAL/PLATELET
Basophils Absolute: 0.1 10*3/uL (ref 0.0–0.2)
Basos: 1 %
EOS (ABSOLUTE): 0.2 10*3/uL (ref 0.0–0.4)
Eos: 2 %
Hematocrit: 24.4 % — ABNORMAL LOW (ref 37.5–51.0)
Hemoglobin: 7.5 g/dL — ABNORMAL LOW (ref 13.0–17.7)
Immature Grans (Abs): 0 10*3/uL (ref 0.0–0.1)
Immature Granulocytes: 0 %
Lymphocytes Absolute: 1.3 10*3/uL (ref 0.7–3.1)
Lymphs: 14 %
MCH: 26.1 pg — ABNORMAL LOW (ref 26.6–33.0)
MCHC: 30.7 g/dL — ABNORMAL LOW (ref 31.5–35.7)
MCV: 85 fL (ref 79–97)
Monocytes Absolute: 0.7 10*3/uL (ref 0.1–0.9)
Monocytes: 7 %
Neutrophils Absolute: 7.5 10*3/uL — ABNORMAL HIGH (ref 1.4–7.0)
Neutrophils: 76 %
Platelets: 500 10*3/uL — ABNORMAL HIGH (ref 150–450)
RBC: 2.87 x10E6/uL — ABNORMAL LOW (ref 4.14–5.80)
RDW: 16.4 % — ABNORMAL HIGH (ref 11.6–15.4)
WBC: 9.7 10*3/uL (ref 3.4–10.8)

## 2023-10-23 LAB — RETICULOCYTES
Immature Retic Fract: 16.3 % — ABNORMAL HIGH (ref 2.3–15.9)
RBC.: 2.93 MIL/uL — ABNORMAL LOW (ref 4.22–5.81)
Retic Count, Absolute: 41 10*3/uL (ref 19.0–186.0)
Retic Ct Pct: 1.4 % (ref 0.4–3.1)

## 2023-10-23 LAB — DIRECT ANTIGLOBULIN TEST (NOT AT ARMC)
DAT, IgG: NEGATIVE
DAT, complement: NEGATIVE

## 2023-10-23 LAB — IRON AND TIBC
Iron: 27 ug/dL — ABNORMAL LOW (ref 45–182)
Saturation Ratios: 17 % — ABNORMAL LOW (ref 17.9–39.5)
TIBC: 162 ug/dL — ABNORMAL LOW (ref 250–450)
UIBC: 135 ug/dL

## 2023-10-23 LAB — SAMPLE TO BLOOD BANK

## 2023-10-23 LAB — FOLATE: Folate: 33.6 ng/mL (ref >5.9)

## 2023-10-23 LAB — PSA: Prostatic Specific Antigen: <0.01 ng/mL (ref 0.00–4.00)

## 2023-10-23 LAB — FERRITIN: Ferritin: 3280 ng/mL — ABNORMAL HIGH (ref 24–336)

## 2023-10-23 LAB — VITAMIN B12: Vitamin B-12: 771 pg/mL (ref 180–914)

## 2023-10-23 LAB — LACTATE DEHYDROGENASE: LDH: 190 U/L (ref 98–192)

## 2023-10-23 NOTE — Patient Instructions (Signed)
You were seen and examined today by Dr. Ellin Saba. Dr. Ellin Saba is a hematologist, meaning that he specializes in blood abnormalities. Dr. Ellin Saba discussed your past medical history, family history of cancers/blood conditions and the events that led to you being here today.  You were referred to Dr. Ellin Saba due to anemia (low hemoglobin).  Dr. Ellin Saba has recommended additional labs today for further evaluation.  Follow-up as scheduled.

## 2023-10-24 ENCOUNTER — Ambulatory Visit (HOSPITAL_COMMUNITY): Payer: Medicare Other

## 2023-10-24 ENCOUNTER — Other Ambulatory Visit: Payer: Self-pay | Admitting: *Deleted

## 2023-10-24 ENCOUNTER — Encounter (HOSPITAL_COMMUNITY): Payer: Self-pay

## 2023-10-24 DIAGNOSIS — D649 Anemia, unspecified: Secondary | ICD-10-CM

## 2023-10-24 DIAGNOSIS — R2689 Other abnormalities of gait and mobility: Secondary | ICD-10-CM

## 2023-10-24 DIAGNOSIS — M6281 Muscle weakness (generalized): Secondary | ICD-10-CM

## 2023-10-24 DIAGNOSIS — Z89611 Acquired absence of right leg above knee: Secondary | ICD-10-CM

## 2023-10-24 LAB — KAPPA/LAMBDA LIGHT CHAINS
Kappa free light chain: 71.1 mg/L — ABNORMAL HIGH (ref 3.3–19.4)
Kappa, lambda light chain ratio: 1.23 (ref 0.26–1.65)
Lambda free light chains: 58 mg/L — ABNORMAL HIGH (ref 5.7–26.3)

## 2023-10-24 LAB — ERYTHROPOIETIN: Erythropoietin: 52.4 m[IU]/mL — ABNORMAL HIGH (ref 2.6–18.5)

## 2023-10-24 LAB — PREPARE RBC (CROSSMATCH)

## 2023-10-24 LAB — HAPTOGLOBIN: Haptoglobin: 795 mg/dL — ABNORMAL HIGH (ref 34–355)

## 2023-10-24 NOTE — Therapy (Signed)
OUTPATIENT PHYSICAL THERAPY LOWER EXTREMITY TREATMENT   Patient Name: [name] MRN: [id number] DOB:1951-09-11, 72 y.o., male Today's Date: 10/24/2023  END OF SESSION:  PT End of Session - 10/24/23 0812     Visit Number 19    Number of Visits 20    Date for PT Re-Evaluation 09/20/23    Authorization Type [employer] Medicare    Authorization Time Period 11/11 -11/27    Authorization - Number of Visits --    Progress Note Due on Visit 20    PT Start Time 0810   arrived late   PT Stop Time 0842    PT Time Calculation (min) 32 min    Activity Tolerance Patient tolerated treatment well;Patient limited by fatigue    Behavior During Therapy [facility] for tasks assessed/performed                   Past Medical History:  Diagnosis Date   Family history of metabolic acidosis with increased anion gap 03/05/2023   GERD (gastroesophageal reflux disease)    History of kidney stones    Hyperlipidemia    Hypertension    Prostate cancer [facility]) 2011   radiation june 2020   Umbilical hernia    Wears dentures    full   Wears glasses    Wears partial dentures    lower   Past Surgical History:  Procedure Laterality Date   ABDOMINAL AORTOGRAM W/LOWER EXTREMITY N/A 11/20/2022   Procedure: ABDOMINAL AORTOGRAM W/LOWER EXTREMITY;  Surgeon: Maeola Harman, MD;  Location: [facility] INVASIVE CV LAB;  Service: Cardiovascular;  Laterality: N/A;   ABDOMINAL AORTOGRAM W/LOWER EXTREMITY N/A 03/05/2023   Procedure: ABDOMINAL AORTOGRAM W/LOWER EXTREMITY;  Surgeon: Maeola Harman, MD;  Location: [facility] INVASIVE CV LAB;  Service: Cardiovascular;  Laterality: N/A;   AMPUTATION Right 03/09/2023   Procedure: AMPUTATION ABOVE KNEE;  Surgeon: Maeola Harman, MD;  Location: [facility] OR;  Service: Vascular;  Laterality: Right;   COLONOSCOPY N/A 08/25/2020   Procedure: COLONOSCOPY;  Surgeon: Corbin Ade, MD;  [facility];  Service: Endoscopy;  Laterality: N/A;  9:30    COLONOSCOPY     CYSTOSCOPY WITH LITHOLAPAXY N/A 05/18/2021   Procedure: CYSTOSCOPY WITH LITHOLAPAXY;  Surgeon: Rene Paci, MD;  Location: [facility];  Service: Urology;  Laterality: N/A;  ONLY NEEDS  30 MIN   FEMORAL-TIBIAL BYPASS GRAFT Right 12/12/2022   Procedure: RIGHT COMMON FEMORAL-POSTERIOR TIBIAL ARTERY BYPASS WITH VEIN HARVESTING OF THE GREATER SAPHENOUS VEIN AND FEMORAL ENDARTERECTOMY WITH COMPOSITE GRAFT;  Surgeon: Maeola Harman, MD;  Location: [facility] OR;  Service: Vascular;  Laterality: Right;   POLYPECTOMY  08/25/2020   Procedure: POLYPECTOMY;  Surgeon: Corbin Ade, MD;  Location: [facility];  Service: Endoscopy;;   ROBOT ASSISTED LAPAROSCOPIC RADICAL PROSTATECTOMY  2011   TONSILLECTOMY  age 25   and adenoids removed   Patient Active Problem List   Diagnosis Date Noted   Anemia 10/19/2023   Abdominal pain 10/10/2023   Cough 10/10/2023   Abnormal gait 07/27/2023   Hypertension 07/10/2023   Blister of left leg 05/24/2023   Acute blood loss anemia 03/27/2023   Phantom pain after amputation of lower extremity (HCC) 03/27/2023   Adjustment disorder with mixed anxiety and depressed mood 03/15/2023   Above knee amputation of right lower extremity (HCC) 03/13/2023   CKD (chronic kidney disease) stage 3, GFR 30-59 ml/min (HCC) 03/13/2023   Cellulitis of right lower extremity 03/09/2023   Fever 03/09/2023  Non-traumatic rhabdomyolysis 03/09/2023   Bandemia 03/09/2023   Acute kidney injury superimposed on chronic kidney disease (HCC) 03/05/2023   Hyperkalemia 03/05/2023   Normocytic anemia 03/05/2023   History of prostate cancer 03/05/2023   Essential hypertension 03/05/2023   Metabolic acidosis, increased anion gap 03/05/2023   Critical limb ischemia of right lower extremity (HCC) 12/12/2022   Malignant neoplasm of prostate (HCC) 03/18/2019    PCP: Rica Records FNP  REFERRING PROVIDER: Genice Rouge, MD  REFERRING  DIAG: [phone] (ICD-10-CM) - S/P AKA (above knee amputation), right (HCC)  THERAPY DIAG:  Other abnormalities of gait and mobility  Muscle weakness (generalized)  S/P AKA (above knee amputation), right (HCC)  Rationale for Evaluation and Treatment: Rehabilitation  ONSET DATE: 03/09/23  SUBJECTIVE:   SUBJECTIVE STATEMENT:  Progress Note(10/24/23): Patient has been going to Hematology [facility]; had first blood transfusion 10/17. Patient recently went to [facility] for blood work and has blood transfusion scheduled tomorrow 10/25/23. Patient also had [facility] appointment recently 10/20 for prosthetic adjustment and has another appointment with [facility]  ~Nov 8th. Since lats progress note, patient has been doing well around the home; states some exercises are harder to perform due to fatigue. NPRS levels have been 7/10 up to 10/10 due to phantom limb pain which lasts 5-97min at a time. Patient would like to continue to improve strength.       Progress Note 09/10/23: Patient has recently been to [facility] to have prosthetic was adjusted; this lasted 2-3 days until the leg began to rotate inwards again. Patient reports still have issues with general mobility and phantom limb pain. The phantom limb pain can get very sharp/intense. Patient has PCP follow up soon.    Eval: Patient states he did a lot of therapy at the hospital following amputation. He has been using RW to get around. He got his prosthetic July 19th. Patient is frustrated with his fatigue, gait speed, dressing difficulties. Is mostly just walking around in the house at this point. Has to take breaks with walking in the house due to fatiuge. He enjoys baking cakes. Has to encourage himself to use leg more. Has not used leg 8 hours yet. Had some wounds prior to amputation. Has some phantom pain.   PERTINENT HISTORY: R AKA 03/09/23, admitted CIR 03/13/23-03/26/23, HTN, HLD, hx prostate cancer PAIN:  Are you having pain? Yes: NPRS scale: 7/10 Pain  location: R quad region Pain description: sharp Aggravating factors: constant Relieving factors: movement, meds  PRECAUTIONS: Fall  WEIGHT BEARING RESTRICTIONS: No  FALLS:  Has patient fallen in last 6 months? No  Patient Survey  Lower Extremity Functional Scale (LEFS)  14 / 80 = 17.5 % on 09/10/23  (10/24/23) Lower Extremity Functional Score: 31 / 80 = 38.8 %     OCCUPATION: Out on leave  PLOF: Independent  PATIENT GOALS: to be walking better like he isn't using a prosthetic     OBJECTIVE: (objective measures from initial evaluation unless otherwise dated)   COGNITION: Overall cognitive status: Within functional limits for tasks assessed     SENSATION: WFL   POSTURE: No Significant postural limitations  PALPATION: No significant limitations  LOWER EXTREMITY ROM: slightly decreased R hip extension ROM    LOWER EXTREMITY MMT:  MMT Right eval Left eval Right  9/16 Left 9/16 Right  10/24/23  Hip flexion 4 (limited range in seated) 4+ 4 4+ 4  Hip extension 3- 3+ 3 3+ 3+  Hip abduction 3- 4- 3 4- 3  Hip adduction       Hip internal rotation       Hip external rotation       Knee flexion  5  5   Knee extension  5  5   Ankle dorsiflexion  5  5   Ankle plantarflexion       Ankle inversion       Ankle eversion        (Blank rows = not tested) *= pain/symptoms    FUNCTIONAL TESTS:  5 times sit to stand: 53.94 seconds with UE support on chair and RW, relies LLE Timed up and go (TUG): 2: 18 with RW (includes time to release air from prosthetic upon standing) 2 minute walk test: 24 feet with RW  09/09/13  5 times sit to stand: 35s seconds with UE support on chair and RW, Timed up and go (TUG): 63m22s with rolling walker; includes time to let air out of prosthetic 2 minute walk test:  43 feet with rolling walker  Progress note #2 10/24/23   : 63 feet with rolling walker, stand-by assist   5TSTS: NEXT SESSION   GAIT: Distance walked: 43  feet Assistive device utilized: Walker - 2 wheeled Level of assistance: SBA Comments: , step to pattern  *08/16/23: Patient ambulates using a step to gait pattern in rolling walker. Minimal increase in 1) lateral heel whip and 2) circumduction on right lower extremity during swing phase   TODAY'S TREATMENT:                                                                                                                              DATE:  10/24/23 PT Progress Note- see above  10/18/23 Standing in // bars  S2S + weight shift of elevated surface   Tandem wt shift onto right lower extremity into Stance Phase Lateral walks in // bars   Seated  Hip marches with 3# 2x10 Gait Training   2 pt step-to with large base cane, PT contact guard        10/18/23 Standing in // bars  Bilateral hip abduction with 3# 2x10 Bilateral hip flexion + weight shifts with 3# 2x10  S2S + weight shift of elevated surface   Tandem wt shift onto right lower extremity into Stance Phase \ Seated  Hip marches with 3# 2x10      10/04/23 -Standing hip abduction in // bars with 2#  -Partial step ups 2" step in // bars, minimal assist  -Seated hip marches with 2#  -Partial sit to stands     PATIENT EDUCATION:  Education details: Patient educated on exam findings, POC, scope of PT, HEP, and prone lying to promote hip extension, skin checks. Person educated: Patient Education method: Explanation, Demonstration, and Handouts Education comprehension: verbalized understanding, returned demonstration, verbal cues required, and tactile cues required  HOME EXERCISE PROGRAM: Access Code: [email or web address] URL: [email or web address]  Date: 08/09/2023 - Prone on Elbows Stretch  -  3 x daily - 7 x weekly - 10-20 minutes hold - Standing March with Counter Support  - 3 x daily - 7 x weekly - 2 sets - 10 reps - Standing Hip Abduction with Counter Support  - 3 x daily - 7 x weekly - 2 sets - 10 reps -  Standing Hip Extension with Counter Support  - 3 x daily - 7 x weekly - 2 sets - 10 reps  ASSESSMENT:  CLINICAL IMPRESSION:    Progress note 10/24/23: Patient a 72 y.o. y.o. male who was seen today for physical therapy progress note for s/p R AKA. Patient has shown improvements in overall distance(up to 63 feet from 43), LEFs Score(up to ~38%) and lower extremity strength for MMT.  Patient still presents with deficits in LE strength, pain levels endurance, activity tolerance, gait, balance, and functional mobility with ADL. Patient is having to modify and restrict ADL as indicated by outcome measure score as well as subjective information and objective measures which is affecting overall participation. Patient will continue to benefit from skilled physical therapy in order to improve function and reduce impairment.  09/10/23: Patient a 72 y.o. y.o. male who was seen today for physical therapy progress note for s/p R AKA. Patient has shown improvements in overall TUG speed, distance, and 5TSTS measures by ~50%. Patient still presents with deficits in LE strength, ROM, endurance, activity tolerance, gait, balance, and functional mobility with ADL. Patient is having to modify and restrict ADL as indicated by outcome measure score as well as subjective information and objective measures which is affecting overall participation. Patient will benefit from skilled physical therapy in order to improve function and reduce impairment.  OBJECTIVE IMPAIRMENTS: Abnormal gait, decreased activity tolerance, decreased balance, decreased mobility, difficulty walking, decreased ROM, decreased strength, increased muscle spasms, impaired flexibility, and improper body mechanics.   ACTIVITY LIMITATIONS: carrying, lifting, bending, standing, squatting, stairs, transfers, bathing, locomotion level, and caring for others  PARTICIPATION LIMITATIONS: meal prep, cleaning, laundry, shopping, community activity, and yard  work  PERSONAL FACTORS: Fitness and 3+ comorbidities: R AKA, HTN, HLD  are also affecting patient's functional outcome.   REHAB POTENTIAL: Good  CLINICAL DECISION MAKING: Stable/uncomplicated  EVALUATION COMPLEXITY: Low   GOALS: Goals reviewed with patient? Yes  SHORT TERM GOALS: Target date: 08/30/2023    Patient will be independent with HEP in order to improve functional outcomes. Baseline:  Goal status: goal met   2.  Patient will report at least 25% improvement in symptoms for improved quality of life. Baseline:  Goal status: goal met     LONG TERM GOALS: Target date: 09/20/2023 --> 11/21/23(updated 10/24/23)    Patient will report at least 75% improvement in symptoms for improved quality of life. Baseline:  Goal status: goal met   2.   Patient will demonstrate grade of 5/5 MMT grade in all tested musculature as evidence of improved strength to assist with stair ambulation and gait.   Baseline: see above Goal status: in progress 10/24/23  3.  Patient will be able to ambulate at least 100 feet in  with LRAD in order to demonstrate improved gait speed for community ambulation. Baseline: 24 feet with RW Goal status:  in progress 10/24/23  4.  Patient will be able to complete TUG in under 24.3 seconds in order to demonstrate improved mobility to reduce risk for falls. Baseline: 2:18 with RW (includes time to release air from prosthetic upon standing) Goal status:  in progress  10/24/23  5.  Patient will be able to complete 5x STS in under 20 seconds in order to demo improved functional strength. Baseline: 53.94 seconds with UE support on chair and RW, relies LLE Goal status: in progress 10/24/23     PLAN:  PT FREQUENCY: 1-2x/week  PT DURATION: 4 weeks  PLANNED INTERVENTIONS: Therapeutic exercises, Therapeutic activity, Neuromuscular re-education, Balance training, Gait training, Patient/Family education, Joint manipulation, Joint mobilization, Stair  training, Orthotic/Fit training, DME instructions, Aquatic Therapy, Dry Needling, Electrical stimulation, Spinal manipulation, Spinal mobilization, Cryotherapy, Moist heat, Compression bandaging, scar mobilization, Splintting, Taping, Traction, Ultrasound, Ionotophoresis 4mg /ml Dexamethasone, and Manual therapy  PLAN FOR NEXT SESSION: hip mobility and hip strength, functional strength, balance and gait training, endurance   Seymour Bars PT, DPT

## 2023-10-25 ENCOUNTER — Inpatient Hospital Stay: Payer: Medicare Other

## 2023-10-25 DIAGNOSIS — Z800 Family history of malignant neoplasm of digestive organs: Secondary | ICD-10-CM | POA: Diagnosis not present

## 2023-10-25 DIAGNOSIS — Z801 Family history of malignant neoplasm of trachea, bronchus and lung: Secondary | ICD-10-CM | POA: Diagnosis not present

## 2023-10-25 DIAGNOSIS — Z923 Personal history of irradiation: Secondary | ICD-10-CM | POA: Diagnosis not present

## 2023-10-25 DIAGNOSIS — Z87891 Personal history of nicotine dependence: Secondary | ICD-10-CM | POA: Diagnosis not present

## 2023-10-25 DIAGNOSIS — D649 Anemia, unspecified: Secondary | ICD-10-CM | POA: Diagnosis not present

## 2023-10-25 LAB — COPPER, SERUM: Copper: 232 ug/dL — ABNORMAL HIGH (ref 69–132)

## 2023-10-25 MED ORDER — DIPHENHYDRAMINE HCL 25 MG PO CAPS
25.0000 mg | ORAL_CAPSULE | Freq: Once | ORAL | Status: AC
  Administered 2023-10-25: 25 mg via ORAL

## 2023-10-25 MED ORDER — ACETAMINOPHEN 325 MG PO TABS
650.0000 mg | ORAL_TABLET | Freq: Once | ORAL | Status: AC
Start: 2023-10-25 — End: 2023-10-25
  Administered 2023-10-25: 650 mg via ORAL

## 2023-10-25 MED ORDER — SODIUM CHLORIDE 0.9% IV SOLUTION
250.0000 mL | INTRAVENOUS | Status: AC
  Administered 2023-10-25: 100 mL via INTRAVENOUS
  Administered 2023-12-01: 75 mL via INTRAVENOUS

## 2023-10-25 NOTE — Progress Notes (Signed)
Patient presents today for 1 unit of PRBC's. Vital signs stable. Patient has no complaints at this time. Blood consent obtained.

## 2023-10-25 NOTE — Addendum Note (Signed)
Addended by: Dicky Doe D on: 10/25/2023 11:32 AM   Modules accepted: Orders

## 2023-10-25 NOTE — Patient Instructions (Signed)
[facility]  Discharge Instructions: Thank you for choosing [facility] to provide your oncology and hematology care.  If you have a lab appointment with the Cancer Center - please note that after April 8th, 2024, all labs will be drawn in the cancer center.  You do not have to check in or register with the main entrance as you have in the past but will complete your check-in in the cancer center.  Wear comfortable clothing and clothing appropriate for easy access to any Portacath or PICC line.   We strive to give you quality time with your provider. You may need to reschedule your appointment if you arrive late (15 or more minutes).  Arriving late affects you and other patients whose appointments are after yours.  Also, if you miss three or more appointments without notifying the office, you may be dismissed from the clinic at the provider's discretion.      For prescription refill requests, have your pharmacy contact our office and allow 72 hours for refills to be completed.    Today you received 1 unit of PRBC's.  Blood Transfusion, Adult, Care After The following information offers guidance on how to care for yourself after your procedure. Your health care provider may also give you more specific instructions. If you have problems or questions, contact your health care provider. What can I expect after the procedure? After the procedure, it is common to have: Bruising and soreness where the IV was inserted. A headache. Follow these instructions at home: IV insertion site care     Follow instructions from your health care provider about how to take care of your IV insertion site. Make sure you: Wash your hands with soap and water for at least 20 seconds before and after you change your bandage (dressing). If soap and water are not available, use hand sanitizer. Change your dressing as told by your health care provider. Check your IV insertion site every  day for signs of infection. Check for: Redness, swelling, or pain. Bleeding from the site. Warmth. Pus or a bad smell. General instructions Take over-the-counter and prescription medicines only as told by your health care provider. Rest as told by your health care provider. Return to your normal activities as told by your health care provider. Keep all follow-up visits. Lab tests may need to be done at certain periods to recheck your blood counts. Contact a health care provider if: You have itching or red, swollen areas of skin (hives). You have a fever or chills. You have pain in the head, back, or chest. You feel anxious or you feel weak after doing your normal activities. You have redness, swelling, warmth, or pain around the IV insertion site. You have blood coming from the IV insertion site that does not stop with pressure. You have pus or a bad smell coming from your IV insertion site. If you received your blood transfusion in an outpatient setting, you will be told whom to contact to report any reactions. Get help right away if: You have symptoms of a serious allergic or immune system reaction, including: Trouble breathing or shortness of breath. Swelling of the face, feeling flushed, or widespread rash. Dark urine or blood in the urine. Fast heartbeat. These symptoms may be an emergency. Get help right away. Call 911. Do not wait to see if the symptoms will go away. Do not drive yourself to the hospital. Summary Bruising and soreness around the IV insertion site  are common. Check your IV insertion site every day for signs of infection. Rest as told by your health care provider. Return to your normal activities as told by your health care provider. Get help right away for symptoms of a serious allergic or immune system reaction to the blood transfusion. This information is not intended to replace advice given to you by your health care provider. Make sure you discuss any  questions you have with your health care provider. Document Revised: 03/10/2022 Document Reviewed: 03/10/2022 Elsevier Patient Education  2024 Elsevier Inc.       To help prevent nausea and vomiting after your treatment, we encourage you to take your nausea medication as directed.  BELOW ARE SYMPTOMS THAT SHOULD BE REPORTED IMMEDIATELY: *FEVER GREATER THAN 100.4 F (38 C) OR HIGHER *CHILLS OR SWEATING *NAUSEA AND VOMITING THAT IS NOT CONTROLLED WITH YOUR NAUSEA MEDICATION *UNUSUAL SHORTNESS OF BREATH *UNUSUAL BRUISING OR BLEEDING *URINARY PROBLEMS (pain or burning when urinating, or frequent urination) *BOWEL PROBLEMS (unusual diarrhea, constipation, pain near the anus) TENDERNESS IN MOUTH AND THROAT WITH OR WITHOUT PRESENCE OF ULCERS (sore throat, sores in mouth, or a toothache) UNUSUAL RASH, SWELLING OR PAIN  UNUSUAL VAGINAL DISCHARGE OR ITCHING   Items with * indicate a potential emergency and should be followed up as soon as possible or go to the Emergency Department if any problems should occur.  Please show the CHEMOTHERAPY ALERT CARD or IMMUNOTHERAPY ALERT CARD at check-in to the Emergency Department and triage nurse.  Should you have questions after your visit or need to cancel or reschedule your appointment, please contact [facility] [phone]  and follow the prompts.  Office hours are 8:00 a.m. to 4:30 p.m. Monday - Friday. Please note that voicemails left after 4:00 p.m. may not be returned until the following business day.  We are closed weekends and major holidays. You have access to a nurse at all times for urgent questions. Please call the main number to the clinic [phone] and follow the prompts.  For any non-urgent questions, you may also contact your provider using MyChart. We now offer e-Visits for anyone 43 and older to request care online for non-urgent symptoms. For details visit [email or web address].   Also download the MyChart app!  Go to the app store, search "MyChart", open the app, select [facility], and log in with your MyChart username and password.

## 2023-10-26 LAB — TYPE AND SCREEN
ABO/RH(D): O POS
Antibody Screen: NEGATIVE
Unit division: 0

## 2023-10-26 LAB — BPAM RBC
Blood Product Expiration Date: 202411252359
ISSUE DATE / TIME: 202410311209
Unit Type and Rh: 5100

## 2023-10-26 LAB — PROTEIN ELECTROPHORESIS, SERUM
A/G Ratio: 0.6 — ABNORMAL LOW (ref 0.7–1.7)
Albumin ELP: 2.4 g/dL — ABNORMAL LOW (ref 2.9–4.4)
Alpha-1-Globulin: 0.6 g/dL — ABNORMAL HIGH (ref 0.0–0.4)
Alpha-2-Globulin: 1.4 g/dL — ABNORMAL HIGH (ref 0.4–1.0)
Beta Globulin: 1 g/dL (ref 0.7–1.3)
Gamma Globulin: 1.2 g/dL (ref 0.4–1.8)
Globulin, Total: 4.2 g/dL — ABNORMAL HIGH (ref 2.2–3.9)
Total Protein ELP: 6.6 g/dL (ref 6.0–8.5)

## 2023-10-29 ENCOUNTER — Ambulatory Visit (HOSPITAL_COMMUNITY): Payer: Medicare Other | Attending: Physical Medicine and Rehabilitation

## 2023-10-29 ENCOUNTER — Other Ambulatory Visit: Payer: Self-pay | Admitting: Physical Medicine and Rehabilitation

## 2023-10-29 DIAGNOSIS — M6281 Muscle weakness (generalized): Secondary | ICD-10-CM

## 2023-10-29 DIAGNOSIS — R2689 Other abnormalities of gait and mobility: Secondary | ICD-10-CM

## 2023-10-29 DIAGNOSIS — Z89611 Acquired absence of right leg above knee: Secondary | ICD-10-CM | POA: Diagnosis not present

## 2023-10-29 LAB — METHYLMALONIC ACID, SERUM: Methylmalonic Acid, Quantitative: 228 nmol/L (ref 0–378)

## 2023-10-29 NOTE — Telephone Encounter (Signed)
Refill both 

## 2023-10-29 NOTE — Therapy (Signed)
OUTPATIENT PHYSICAL THERAPY LOWER EXTREMITY TREATMENT   Patient Name: [name] MRN: [id number] DOB:Dec 29, 1950, 72 y.o., male Today's Date: 10/29/2023  END OF SESSION:  PT End of Session - 10/29/23 0813     Visit Number 20    Number of Visits 20    Date for PT Re-Evaluation 09/20/23    Authorization Type [employer]    Authorization Time Period 11/11 -11/27    Progress Note Due on Visit 20    PT Start Time 0810    PT Stop Time 0845    PT Time Calculation (min) 35 min    Activity Tolerance Patient tolerated treatment well;Patient limited by fatigue    Behavior During Therapy [facility] for tasks assessed/performed                   Past Medical History:  Diagnosis Date   Family history of metabolic acidosis with increased anion gap 03/05/2023   GERD (gastroesophageal reflux disease)    History of kidney stones    Hyperlipidemia    Hypertension    Prostate cancer [facility]) 2011   radiation june 2020   Umbilical hernia    Wears dentures    full   Wears glasses    Wears partial dentures    lower   Past Surgical History:  Procedure Laterality Date   ABDOMINAL AORTOGRAM W/LOWER EXTREMITY N/A 11/20/2022   Procedure: ABDOMINAL AORTOGRAM W/LOWER EXTREMITY;  Surgeon: Maeola Harman, MD;  Location: [facility] INVASIVE CV LAB;  Service: Cardiovascular;  Laterality: N/A;   ABDOMINAL AORTOGRAM W/LOWER EXTREMITY N/A 03/05/2023   Procedure: ABDOMINAL AORTOGRAM W/LOWER EXTREMITY;  Surgeon: Maeola Harman, MD;  Location: [facility] INVASIVE CV LAB;  Service: Cardiovascular;  Laterality: N/A;   AMPUTATION Right 03/09/2023   Procedure: AMPUTATION ABOVE KNEE;  Surgeon: Maeola Harman, MD;  Location: [facility] OR;  Service: Vascular;  Laterality: Right;   COLONOSCOPY N/A 08/25/2020   Procedure: COLONOSCOPY;  Surgeon: Corbin Ade, MD;  [facility];  Service: Endoscopy;  Laterality: N/A;  9:30   COLONOSCOPY     CYSTOSCOPY WITH LITHOLAPAXY N/A 05/18/2021    Procedure: CYSTOSCOPY WITH LITHOLAPAXY;  Surgeon: Rene Paci, MD;  Location: [facility];  Service: Urology;  Laterality: N/A;  ONLY NEEDS  30 MIN   FEMORAL-TIBIAL BYPASS GRAFT Right 12/12/2022   Procedure: RIGHT COMMON FEMORAL-POSTERIOR TIBIAL ARTERY BYPASS WITH VEIN HARVESTING OF THE GREATER SAPHENOUS VEIN AND FEMORAL ENDARTERECTOMY WITH COMPOSITE GRAFT;  Surgeon: Maeola Harman, MD;  Location: [facility] OR;  Service: Vascular;  Laterality: Right;   POLYPECTOMY  08/25/2020   Procedure: POLYPECTOMY;  Surgeon: Corbin Ade, MD;  Location: [facility];  Service: Endoscopy;;   ROBOT ASSISTED LAPAROSCOPIC RADICAL PROSTATECTOMY  2011   TONSILLECTOMY  age 72   and adenoids removed   Patient Active Problem List   Diagnosis Date Noted   Anemia 10/19/2023   Abdominal pain 10/10/2023   Cough 10/10/2023   Abnormal gait 07/27/2023   Hypertension 07/10/2023   Blister of left leg 05/24/2023   Acute blood loss anemia 03/27/2023   Phantom pain after amputation of lower extremity (HCC) 03/27/2023   Adjustment disorder with mixed anxiety and depressed mood 03/15/2023   Above knee amputation of right lower extremity (HCC) 03/13/2023   CKD (chronic kidney disease) stage 3, GFR 30-59 ml/min (HCC) 03/13/2023   Cellulitis of right lower extremity 03/09/2023   Fever 03/09/2023   Non-traumatic rhabdomyolysis 03/09/2023   Bandemia 03/09/2023   Acute  kidney injury superimposed on chronic kidney disease (HCC) 03/05/2023   Hyperkalemia 03/05/2023   Normocytic anemia 03/05/2023   History of prostate cancer 03/05/2023   Essential hypertension 03/05/2023   Metabolic acidosis, increased anion gap 03/05/2023   Critical limb ischemia of right lower extremity (HCC) 12/12/2022   Malignant neoplasm of prostate (HCC) 03/18/2019    PCP: Rica Records FNP  REFERRING PROVIDER: Genice Rouge, MD  REFERRING DIAG: [phone] (ICD-10-CM) - S/P AKA (above knee amputation), right  (HCC)  THERAPY DIAG:  Other abnormalities of gait and mobility  Muscle weakness (generalized)  S/P AKA (above knee amputation), right (HCC)  Rationale for Evaluation and Treatment: Rehabilitation  ONSET DATE: 03/09/23  SUBJECTIVE:   SUBJECTIVE STATEMENT:  Today: Patient had blood transfusion last week; has ultrasound scheduled for left lower extremity.   Progress Note(10/24/23): Patient has been going to [facility]; had first blood transfusion 10/17. Patient recently went to [facility] for blood work and has blood transfusion scheduled tomorrow 10/25/23. Patient also had [facility] appointment recently 10/20 for prosthetic adjustment and has another appointment with [facility]  ~Nov 8th. Since lats progress note, patient has been doing well around the home; states some exercises are harder to perform due to fatigue. NPRS levels have been 7/10 up to 10/10 due to phantom limb pain which lasts 5-39min at a time. Patient would like to continue to improve strength.       Progress Note 09/10/23: Patient has recently been to [facility] to have prosthetic was adjusted; this lasted 2-3 days until the leg began to rotate inwards again. Patient reports still have issues with general mobility and phantom limb pain. The phantom limb pain can get very sharp/intense. Patient has PCP follow up soon.    Eval: Patient states he did a lot of therapy at the hospital following amputation. He has been using RW to get around. He got his prosthetic July 19th. Patient is frustrated with his fatigue, gait speed, dressing difficulties. Is mostly just walking around in the house at this point. Has to take breaks with walking in the house due to fatiuge. He enjoys baking cakes. Has to encourage himself to use leg more. Has not used leg 8 hours yet. Had some wounds prior to amputation. Has some phantom pain.   PERTINENT HISTORY: R AKA 03/09/23, admitted [facility] 03/13/23-03/26/23, HTN, HLD, hx prostate cancer PAIN:  Are you having  pain? Yes: NPRS scale: 7/10 Pain location: R quad region Pain description: sharp Aggravating factors: constant Relieving factors: movement, meds  PRECAUTIONS: Fall  WEIGHT BEARING RESTRICTIONS: No  FALLS:  Has patient fallen in last 6 months? No  Patient Survey  Lower Extremity Functional Scale (LEFS)  14 / 80 = 17.5 % on 09/10/23  (10/24/23) Lower Extremity Functional Score: 31 / 80 = 38.8 %     OCCUPATION: Out on leave  PLOF: Independent  PATIENT GOALS: to be walking better like he isn't using a prosthetic     OBJECTIVE: (objective measures from initial evaluation unless otherwise dated)   COGNITION: Overall cognitive status: Within functional limits for tasks assessed     SENSATION: WFL   POSTURE: No Significant postural limitations  PALPATION: No significant limitations  LOWER EXTREMITY ROM: slightly decreased R hip extension ROM    LOWER EXTREMITY MMT:  MMT Right eval Left eval Right  9/16 Left 9/16 Right  10/24/23  Hip flexion 4 (limited range in seated) 4+ 4 4+ 4  Hip extension 3- 3+ 3 3+ 3+  Hip  abduction 3- 4- 3 4- 3  Hip adduction       Hip internal rotation       Hip external rotation       Knee flexion  5  5   Knee extension  5  5   Ankle dorsiflexion  5  5   Ankle plantarflexion       Ankle inversion       Ankle eversion        (Blank rows = not tested) *= pain/symptoms    FUNCTIONAL TESTS:  5 times sit to stand: 53.94 seconds with UE support on chair and RW, relies LLE Timed up and go (TUG): 2: 18 with RW (includes time to release air from prosthetic upon standing) 2 minute walk test: 24 feet with RW  09/09/13  5 times sit to stand: 35s seconds with UE support on chair and RW, Timed up and go (TUG): 56m22s with rolling walker; includes time to let air out of prosthetic 2 minute walk test:  43 feet with rolling walker  Progress note #2 10/24/23   : 63 feet with rolling walker, stand-by assist   5TSTS: 30.25s: moderate  UE use; partial full sit to stand(10/29/23)  TUG: 88m17secs (10/29/23)  GAIT: Distance walked: 43 feet Assistive device utilized: Walker - 2 wheeled Level of assistance: SBA Comments: , step to pattern  *08/16/23: Patient ambulates using a step to gait pattern in rolling walker. Minimal increase in 1) lateral heel whip and 2) circumduction on right lower extremity during swing phase  10/29/23: Patient ambulates with rolling walker mimicking a 2-point step through pattern; moderate UE assistance on rolling walker; minimal internal rotation of residual limb during Swing Phase    TODAY'S TREATMENT:                                                                                                                              DATE:  10/29/23 Objective tests & measures  5TSTS  TUG  Seated hip marches   10/24/23 PT Progress Note- see above  10/18/23 Standing in // bars  S2S + weight shift of elevated surface   Tandem wt shift onto right lower extremity into Stance Phase Lateral walks in // bars   Seated  Hip marches with 3# 2x10 Gait Training   2 pt step-to with large base cane, PT contact guard        10/18/23 Standing in // bars  Bilateral hip abduction with 3# 2x10 Bilateral hip flexion + weight shifts with 3# 2x10  S2S + weight shift of elevated surface   Tandem wt shift onto right lower extremity into Stance Phase \ Seated  Hip marches with 3# 2x10      10/04/23 -Standing hip abduction in // bars with 2#  -Partial step ups 2" step in // bars, minimal assist  -Seated hip marches with 2#  -Partial sit to stands     PATIENT EDUCATION:  Education  details: Patient educated on exam findings, POC, scope of PT, HEP, and prone lying to promote hip extension, skin checks. Person educated: Patient Education method: Explanation, Demonstration, and Handouts Education comprehension: verbalized understanding, returned demonstration, verbal cues required, and tactile cues  required  HOME EXERCISE PROGRAM: Access Code: [email or web address] URL: [email or web address]  Date: 08/09/2023 - Prone on Elbows Stretch  - 3 x daily - 7 x weekly - 10-20 minutes hold - Standing March with Counter Support  - 3 x daily - 7 x weekly - 2 sets - 10 reps - Standing Hip Abduction with Counter Support  - 3 x daily - 7 x weekly - 2 sets - 10 reps - Standing Hip Extension with Counter Support  - 3 x daily - 7 x weekly - 2 sets - 10 reps  ASSESSMENT:  CLINICAL IMPRESSION:    Today: PT used beginning of session to finish objective test measures from last progress note because patient arrived late this/prior session. Patient to show good improvement in 5TSTS and minimal improvement in the TUG; required intermittent seated breaks due to fatigue without increase in pain levels. Patient will benefit from skilled physical therapy in order to improve function and reduce impairment.    Progress note 10/24/23: Patient a 72 y.o. y.o. male who was seen today for physical therapy progress note for s/p R AKA. Patient has shown improvements in overall distance(up to 63 feet from 43), LEFs Score(up to ~38%) and lower extremity strength for MMT.  Patient still presents with deficits in LE strength, pain levels endurance, activity tolerance, gait, balance, and functional mobility with ADL. Patient is having to modify and restrict ADL as indicated by outcome measure score as well as subjective information and objective measures which is affecting overall participation.    09/10/23: Patient a 72 y.o. y.o. male who was seen today for physical therapy progress note for s/p R AKA. Patient has shown improvements in overall TUG speed, distance, and 5TSTS measures by ~50%. Patient still presents with deficits in LE strength, ROM, endurance, activity tolerance, gait, balance, and functional mobility with ADL. Patient is having to modify and restrict ADL as indicated by outcome measure score as well  as subjective information and objective measures which is affecting overall participation. Patient will benefit from skilled physical therapy in order to improve function and reduce impairment.  OBJECTIVE IMPAIRMENTS: Abnormal gait, decreased activity tolerance, decreased balance, decreased mobility, difficulty walking, decreased ROM, decreased strength, increased muscle spasms, impaired flexibility, and improper body mechanics.   ACTIVITY LIMITATIONS: carrying, lifting, bending, standing, squatting, stairs, transfers, bathing, locomotion level, and caring for others  PARTICIPATION LIMITATIONS: meal prep, cleaning, laundry, shopping, community activity, and yard work  PERSONAL FACTORS: Fitness and 3+ comorbidities: R AKA, HTN, HLD  are also affecting patient's functional outcome.   REHAB POTENTIAL: Good  CLINICAL DECISION MAKING: Stable/uncomplicated  EVALUATION COMPLEXITY: Low   GOALS: Goals reviewed with patient? Yes  SHORT TERM GOALS: Target date: 08/30/2023    Patient will be independent with HEP in order to improve functional outcomes. Baseline:  Goal status: goal met   2.  Patient will report at least 25% improvement in symptoms for improved quality of life. Baseline:  Goal status: goal met     LONG TERM GOALS: Target date: 09/20/2023 --> 11/21/23(updated 10/24/23)    Patient will report at least 75% improvement in symptoms for improved quality of life. Baseline:  Goal status: goal met   2.   Patient will demonstrate grade of 5/5  MMT grade in all tested musculature as evidence of improved strength to assist with stair ambulation and gait.   Baseline: see above Goal status: in progress 10/24/23  3.  Patient will be able to ambulate at least 100 feet in  with LRAD in order to demonstrate improved gait speed for community ambulation. Baseline: 24 feet with RW Goal status:  in progress 10/24/23  4.  Patient will be able to complete TUG in under 24.3 seconds in  order to demonstrate improved mobility to reduce risk for falls. Baseline: 2:18 with RW (includes time to release air from prosthetic upon standing) Goal status:  in progress 10/24/23  5.  Patient will be able to complete 5x STS in under 20 seconds in order to demo improved functional strength. Baseline: 53.94 seconds with UE support on chair and RW, relies LLE Goal status: in progress 10/24/23     PLAN:  PT FREQUENCY: 1-2x/week  PT DURATION: 4 weeks  PLANNED INTERVENTIONS: Therapeutic exercises, Therapeutic activity, Neuromuscular re-education, Balance training, Gait training, Patient/Family education, Joint manipulation, Joint mobilization, Stair training, Orthotic/Fit training, DME instructions, Aquatic Therapy, Dry Needling, Electrical stimulation, Spinal manipulation, Spinal mobilization, Cryotherapy, Moist heat, Compression bandaging, scar mobilization, Splintting, Taping, Traction, Ultrasound, Ionotophoresis 4mg /ml Dexamethasone, and Manual therapy  PLAN FOR NEXT SESSION: hip mobility and hip strength, functional strength, balance and gait training, endurance   Seymour Bars PT, DPT

## 2023-10-30 LAB — IMMUNOFIXATION ELECTROPHORESIS
IgA: 283 mg/dL (ref 61–437)
IgG (Immunoglobin G), Serum: 1329 mg/dL (ref 603–1613)
IgM (Immunoglobulin M), Srm: 59 mg/dL (ref 15–143)
Total Protein ELP: 6.8 g/dL (ref 6.0–8.5)

## 2023-10-31 ENCOUNTER — Ambulatory Visit (HOSPITAL_COMMUNITY)
Admission: RE | Admit: 2023-10-31 | Discharge: 2023-10-31 | Disposition: A | Discharge status: Home / Self Care | Payer: Medicare Other | Source: Ambulatory Visit | Attending: Vascular Surgery | Admitting: Vascular Surgery

## 2023-10-31 ENCOUNTER — Ambulatory Visit: Payer: Medicare Other | Admitting: Physician Assistant

## 2023-10-31 ENCOUNTER — Encounter: Payer: Self-pay | Admitting: Vascular Surgery

## 2023-10-31 VITALS — BP 122/67 | HR 89 | Temp 97.7°F | Ht 70.0 in | Wt 218.0 lb

## 2023-10-31 DIAGNOSIS — R600 Localized edema: Secondary | ICD-10-CM

## 2023-10-31 DIAGNOSIS — I70221 Atherosclerosis of native arteries of extremities with rest pain, right leg: Secondary | ICD-10-CM | POA: Insufficient documentation

## 2023-10-31 DIAGNOSIS — Z89611 Acquired absence of right leg above knee: Secondary | ICD-10-CM | POA: Diagnosis not present

## 2023-10-31 LAB — VAS US ABI WITH/WO TBI: Left ABI: 0.73

## 2023-10-31 NOTE — Progress Notes (Signed)
Office Note     CC:  follow up Requesting Provider:  Wylene Men*  HPI: [name] is a 72 y.o. (1951-02-11) male who presents for surveillance of PAD.  He underwent right above-the-knee amputation by Dr. Randie Heinz on 03/09/2023.  AKA was initially slow to heal but now he is ambulatory with a prosthetic leg.  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  He denies any claudication, rest pain, or tissue loss of the left lower extremity.  He is on aspirin and statin daily.  He is a former smoker.  He continues to work twice weekly with physical therapy to improve mobility. It should be noted that the patient had a venous ulceration of the left lower extremity since he was last seen in our office.  He was treated with p.o. antibiotics.  He has since healed the wound.  He has swelling of the left leg however this is not bothersome to him.  He manages edema symptoms with elevation.  He does not wear compression.  Past Medical History:  Diagnosis Date   Family history of metabolic acidosis with increased anion gap 03/05/2023   GERD (gastroesophageal reflux disease)    History of kidney stones    Hyperlipidemia    Hypertension    Prostate cancer [facility]) 2011   radiation june 2020   Umbilical hernia    Wears dentures    full   Wears glasses    Wears partial dentures    lower    Past Surgical History:  Procedure Laterality Date   ABDOMINAL AORTOGRAM W/LOWER EXTREMITY N/A 11/20/2022   Procedure: ABDOMINAL AORTOGRAM W/LOWER EXTREMITY;  Surgeon: Maeola Harman, MD;  Location: [facility] INVASIVE CV LAB;  Service: Cardiovascular;  Laterality: N/A;   ABDOMINAL AORTOGRAM W/LOWER EXTREMITY N/A 03/05/2023   Procedure: ABDOMINAL AORTOGRAM W/LOWER EXTREMITY;  Surgeon: Maeola Harman, MD;  Location: [facility] INVASIVE CV LAB;  Service: Cardiovascular;  Laterality: N/A;   AMPUTATION Right 03/09/2023   Procedure: AMPUTATION ABOVE KNEE;  Surgeon:  Maeola Harman, MD;  Location: [facility] OR;  Service: Vascular;  Laterality: Right;   COLONOSCOPY N/A 08/25/2020   Procedure: COLONOSCOPY;  Surgeon: Corbin Ade, MD;  Location: [facility];  Service: Endoscopy;  Laterality: N/A;  9:30   COLONOSCOPY     CYSTOSCOPY WITH LITHOLAPAXY N/A 05/18/2021   Procedure: CYSTOSCOPY WITH LITHOLAPAXY;  Surgeon: Rene Paci, MD;  Location: [facility];  Service: Urology;  Laterality: N/A;  ONLY NEEDS  30 MIN   FEMORAL-TIBIAL BYPASS GRAFT Right 12/12/2022   Procedure: RIGHT COMMON FEMORAL-POSTERIOR TIBIAL ARTERY BYPASS WITH VEIN HARVESTING OF THE GREATER SAPHENOUS VEIN AND FEMORAL ENDARTERECTOMY WITH COMPOSITE GRAFT;  Surgeon: Maeola Harman, MD;  Location: [facility] OR;  Service: Vascular;  Laterality: Right;   POLYPECTOMY  08/25/2020   Procedure: POLYPECTOMY;  Surgeon: Corbin Ade, MD;  Location: [facility];  Service: Endoscopy;;   ROBOT ASSISTED LAPAROSCOPIC RADICAL PROSTATECTOMY  2011   TONSILLECTOMY  age 32   and adenoids removed    Social History   Socioeconomic History   Marital status: Single    Spouse name: Not on file   Number of children: 1   Years of education: Not on file   Highest education level: Some college, no degree  Occupational History    Comment: customer service   Tobacco Use   Smoking status: Former    Current packs/day: 0.00    Average packs/day:  0.3 packs/day for 21.0 years (5.3 ttl pk-yrs)    Types: Cigarettes    Start date: 11/29/2001    Quit date: 11/29/2022    Years since quitting: 0.9    Passive exposure: Never   Smokeless tobacco: Never  Vaping Use   Vaping status: Never Used  Substance and Sexual Activity   Alcohol use: Never   Drug use: Never   Sexual activity: Not Currently  Other Topics Concern   Not on file  Social History Narrative   Has one son.    Social Determinants of Health   Financial Resource Strain: Medium Risk (10/09/2023)   Overall Financial  Resource Strain (CARDIA)    Difficulty of Paying Living Expenses: Somewhat hard  Food Insecurity: Food Insecurity Present (10/09/2023)   Hunger Vital Sign    Worried About Running Out of Food in the Last Year: Sometimes true    Ran Out of Food in the Last Year: Sometimes true  Transportation Needs: No Transportation Needs (10/09/2023)   PRAPARE - Administrator, Civil Service (Medical): No    Lack of Transportation (Non-Medical): No  Physical Activity: Unknown (10/09/2023)   Exercise Vital Sign    Days of Exercise per Week: 0 days    Minutes of Exercise per Session: Not on file  Stress: No Stress Concern Present (10/09/2023)   [employer] of Occupational Health - Occupational Stress Questionnaire    Feeling of Stress : Not at all  Social Connections: Moderately Integrated (10/09/2023)   Social Connection and Isolation Panel [NHANES]    Frequency of Communication with Friends and Family: More than three times a week    Frequency of Social Gatherings with Friends and Family: Never    Attends Religious Services: 1 to 4 times per year    Active Member of [employer] or Organizations: Yes    Attends Banker Meetings: Patient declined    Marital Status: Never married  Intimate Partner Violence: Not At Risk (12/12/2022)   Humiliation, Afraid, Rape, and Kick questionnaire    Fear of Current or Ex-Partner: No    Emotionally Abused: No    Physically Abused: No    Sexually Abused: No    Family History  Problem Relation Age of Onset   Gastric cancer Mother    Prostate cancer Father    Breast cancer Neg Hx    Pancreatic cancer Neg Hx     Current Outpatient Medications  Medication Sig Dispense Refill   acetaminophen (TYLENOL) 325 MG tablet Take 1-2 tablets (325-650 mg total) by mouth every 4 (four) hours as needed for mild pain.     albuterol (VENTOLIN HFA) 108 (90 Base) MCG/ACT inhaler Inhale 2 puffs into the lungs every 6 (six) hours as needed for wheezing or  shortness of breath. 8 g 2   aspirin EC 81 MG tablet Take 81 mg by mouth daily.     atorvastatin (LIPITOR) 40 MG tablet Take 1 tablet (40 mg total) by mouth every morning. 30 tablet 0   benzonatate (TESSALON) 200 MG capsule Take 1 capsule (200 mg total) by mouth 2 (two) times daily as needed for cough. 20 capsule 0   cyclobenzaprine (FLEXERIL) 5 MG tablet Take 1 tablet (5 mg total) by mouth 3 (three) times daily as needed for muscle spasms. 90 tablet 5   gabapentin (NEURONTIN) 100 MG capsule Take 2 capsules (200 mg total) by mouth 2 (two) times daily. Along with 300 mg nightly- for phantom pain- due to R AKA  120 capsule 5   gabapentin (NEURONTIN) 100 MG capsule Take 1 capsule by mouth once daily 30 capsule 5   gabapentin (NEURONTIN) 300 MG capsule Take 1 capsule by mouth at bedtime 30 capsule 5   iron polysaccharides (NIFEREX) 150 MG capsule Take 1 capsule (150 mg total) by mouth daily. 30 capsule 3   levocetirizine (XYZAL) 5 MG tablet Take 1 tablet (5 mg total) by mouth every evening. 15 tablet 1   lisinopril-hydrochlorothiazide (ZESTORETIC) 10-12.5 MG tablet Take 1 tablet by mouth daily. 30 tablet 3   metoprolol tartrate (LOPRESSOR) 25 MG tablet Take 0.5 tablets (12.5 mg total) by mouth 2 (two) times daily. Will give 1 month supply- and get from NP/PCP in future. 30 tablet 0   Multiple Vitamins-Minerals (CENTRUM SILVER PO) Take 1 tablet by mouth daily.      mupirocin ointment (BACTROBAN) 2 % Apply 1 Application topically 2 (two) times daily. 30 g 3   oxybutynin (DITROPAN-XL) 10 MG 24 hr tablet Take 10 mg by mouth daily.     Oxycodone HCl 10 MG TABS Take 1 tablet (10 mg total) by mouth 3 (three) times daily as needed. 2 tablet 0   pantoprazole (PROTONIX) 40 MG tablet Take 1 tablet (40 mg total) by mouth daily. 30 tablet 0   polyethylene glycol powder (MIRALAX) 17 GM/SCOOP powder Take 17 g by mouth daily as needed for mild constipation. 238 g 2   senna-docusate (SENOKOT-S) 8.6-50 MG tablet Take 2  tablets by mouth daily after supper. 30 tablet 0   sodium bicarbonate 650 MG tablet Take 1 tablet (650 mg total) by mouth 2 (two) times daily. 60 tablet 0   No current facility-administered medications for this visit.   Facility-Administered Medications Ordered in Other Visits  Medication Dose Route Frequency Provider Last Rate Last Admin   0.9 %  sodium chloride infusion (Manually program via Guardrails IV Fluids)  250 mL Intravenous Continuous Doreatha Massed, MD   Stopped at 10/25/23 1356    Allergies  Allergen Reactions   Shellfish Allergy Swelling     REVIEW OF SYSTEMS:   [X]  denotes positive finding, [ ]  denotes negative finding Cardiac  Comments:  Chest pain or chest pressure:    Shortness of breath upon exertion:    Short of breath when lying flat:    Irregular heart rhythm:        Vascular    Pain in calf, thigh, or hip brought on by ambulation:    Pain in feet at night that wakes you up from your sleep:     Blood clot in your veins:    Leg swelling:         Pulmonary    Oxygen at home:    Productive cough:     Wheezing:         Neurologic    Sudden weakness in arms or legs:     Sudden numbness in arms or legs:     Sudden onset of difficulty speaking or slurred speech:    Temporary loss of vision in one eye:     Problems with dizziness:         Gastrointestinal    Blood in stool:     Vomited blood:         Genitourinary    Burning when urinating:     Blood in urine:        Psychiatric    Major depression:         Hematologic  Bleeding problems:    Problems with blood clotting too easily:        Skin    Rashes or ulcers:        Constitutional    Fever or chills:      PHYSICAL EXAMINATION:  Vitals:   10/31/23 1401  BP: 122/67  Pulse: 89  Temp: 97.7 F (36.5 C)  SpO2: 96%  Weight: 218 lb (98.9 kg)  Height: 5\' 10"  (1.778 m)    General:  WDWN in NAD; vital signs documented above Gait: Not observed HENT: WNL,  normocephalic Pulmonary: normal non-labored breathing , without Rales, rhonchi,  wheezing Cardiac: regular HR Abdomen: soft, NT, no masses Skin: without rashes Vascular Exam/Pulses: Absent left pedal pulses Extremities: without ischemic changes, without Gangrene , without cellulitis; without open wounds;  Musculoskeletal: no muscle wasting or atrophy  Neurologic: A&O X 3 Psychiatric:  The pt has Normal affect.   Non-Invasive Vascular Imaging:   ABI/TBIToday's ABIToday's TBIPrevious ABIPrevious TBI  +-------+-----------+-----------+------------+------------+  Right AKA                   not aquired 0             +-------+-----------+-----------+------------+------------+  Left  0.73       0.5        0.82        0.35          +-------+-----------+-----------+------------+------------+      ASSESSMENT/PLAN:: 72 y.o. male here for follow up for surveillance of PAD with history of right AKA  Subjective his left lower extremity is without claudication and rest pain.  He is also without wounds of the left foot.  We discussed that blood flow in the left lower extremity is not perfect but it is adequate.  No indication for further workup or revascularization despite ABI of 0.7 with a TBI of 0.5.  He continues to work with physical therapy to improve his mobility twice a week.  He is also unhappy with the fit of the prosthetic limb and has ongoing appointments with the [facility].  For the above reasons he would like a work note to push back his return to work date.  This was provided at the conclusion of our appointment today.  We also discussed proper management of edema symptoms of the left lower extremity due to history of venous ulcerations.  He should continue proper leg elevation.  We also discussed use of compression stockings however he is currently not interested.  He will follow-up in 1 year with repeat ABI.  He knows to call/return office sooner if he develops any  wounds or rest pain in the left foot.   Emilie Rutter, PA-C [facility] [phone]  Clinic MD:   Randie Heinz

## 2023-11-01 ENCOUNTER — Ambulatory Visit (HOSPITAL_COMMUNITY): Payer: Medicare Other

## 2023-11-01 DIAGNOSIS — Z89611 Acquired absence of right leg above knee: Secondary | ICD-10-CM | POA: Diagnosis not present

## 2023-11-01 DIAGNOSIS — M6281 Muscle weakness (generalized): Secondary | ICD-10-CM | POA: Diagnosis not present

## 2023-11-01 DIAGNOSIS — R2689 Other abnormalities of gait and mobility: Secondary | ICD-10-CM | POA: Diagnosis not present

## 2023-11-01 NOTE — Therapy (Signed)
OUTPATIENT PHYSICAL THERAPY LOWER EXTREMITY TREATMENT   Patient Name: [name] MRN: [id number] DOB:March 30, 1951, 72 y.o., male Today's Date: 11/01/2023  END OF SESSION:  PT End of Session - 11/01/23 0811     Visit Number 21    Number of Visits 25    Date for PT Re-Evaluation 09/20/23    Authorization Type [employer] Medicare    Authorization Time Period 11/11 -11/27    Progress Note Due on Visit 20    PT Start Time 0807    PT Stop Time 0845    PT Time Calculation (min) 38 min    Activity Tolerance Patient tolerated treatment well;Patient limited by fatigue    Behavior During Therapy [facility] for tasks assessed/performed                 Past Medical History:  Diagnosis Date   Family history of metabolic acidosis with increased anion gap 03/05/2023   GERD (gastroesophageal reflux disease)    History of Lewis stones    Hyperlipidemia    Hypertension    Prostate cancer [facility]) 2011   radiation june 2020   Umbilical hernia    Wears dentures    full   Wears glasses    Wears partial dentures    lower   Past Surgical History:  Procedure Laterality Date   ABDOMINAL AORTOGRAM W/LOWER EXTREMITY N/A 11/20/2022   Procedure: ABDOMINAL AORTOGRAM W/LOWER EXTREMITY;  Surgeon: Maeola Harman, MD;  Location: [facility] INVASIVE CV LAB;  Service: Cardiovascular;  Laterality: N/A;   ABDOMINAL AORTOGRAM W/LOWER EXTREMITY N/A 03/05/2023   Procedure: ABDOMINAL AORTOGRAM W/LOWER EXTREMITY;  Surgeon: Maeola Harman, MD;  Location: [facility] INVASIVE CV LAB;  Service: Cardiovascular;  Laterality: N/A;   AMPUTATION Right 03/09/2023   Procedure: AMPUTATION ABOVE KNEE;  Surgeon: Maeola Harman, MD;  Location: [facility];  Service: Vascular;  Laterality: Right;   COLONOSCOPY N/A 08/25/2020   Procedure: COLONOSCOPY;  Surgeon: Corbin Ade, MD;  Location: [facility];  Service: Endoscopy;  Laterality: N/A;  9:30   COLONOSCOPY     CYSTOSCOPY WITH LITHOLAPAXY N/A 05/18/2021    Procedure: CYSTOSCOPY WITH LITHOLAPAXY;  Surgeon: Rene Paci, MD;  Location: [facility];  Service: Urology;  Laterality: N/A;  ONLY NEEDS  30 MIN   FEMORAL-TIBIAL BYPASS GRAFT Right 12/12/2022   Procedure: RIGHT COMMON FEMORAL-POSTERIOR TIBIAL ARTERY BYPASS WITH VEIN HARVESTING OF THE GREATER SAPHENOUS VEIN AND FEMORAL ENDARTERECTOMY WITH COMPOSITE GRAFT;  Surgeon: Maeola Harman, MD;  Location: [facility] OR;  Service: Vascular;  Laterality: Right;   POLYPECTOMY  08/25/2020   Procedure: POLYPECTOMY;  Surgeon: Corbin Ade, MD;  Location: [facility];  Service: Endoscopy;;   ROBOT ASSISTED LAPAROSCOPIC RADICAL PROSTATECTOMY  2011   TONSILLECTOMY  age 52   and adenoids removed   Patient Active Problem List   Diagnosis Date Noted   Anemia 10/19/2023   Abdominal pain 10/10/2023   Cough 10/10/2023   Abnormal gait 07/27/2023   Hypertension 07/10/2023   Blister of left leg 05/24/2023   Acute blood loss anemia 03/27/2023   Phantom pain after amputation of lower extremity (HCC) 03/27/2023   Adjustment disorder with mixed anxiety and depressed mood 03/15/2023   Above knee amputation of right lower extremity (HCC) 03/13/2023   CKD (chronic Lewis disease) stage 3, GFR 30-59 ml/min (HCC) 03/13/2023   Cellulitis of right lower extremity 03/09/2023   Fever 03/09/2023   Non-traumatic rhabdomyolysis 03/09/2023   Bandemia 03/09/2023   Acute Lewis injury  superimposed on chronic Lewis disease (HCC) 03/05/2023   Hyperkalemia 03/05/2023   Normocytic anemia 03/05/2023   History of prostate cancer 03/05/2023   Essential hypertension 03/05/2023   Metabolic acidosis, increased anion gap 03/05/2023   Critical limb ischemia of right lower extremity (HCC) 12/12/2022   Malignant neoplasm of prostate (HCC) 03/18/2019    PCP: Rica Records FNP  REFERRING PROVIDER: Genice Rouge, MD  REFERRING DIAG: [phone] (ICD-10-CM) - S/P AKA (above knee amputation), right  (HCC)  THERAPY DIAG:  Other abnormalities of gait and mobility  Muscle weakness (generalized)  S/P AKA (above knee amputation), right (HCC)  Rationale for Evaluation and Treatment: Rehabilitation  ONSET DATE: 03/09/23  SUBJECTIVE:   SUBJECTIVE STATEMENT:  Today: Patient had an ultrasound of the left lower extremity yesterday; no new findings    Progress Note(10/24/23): Patient has been going to [facility]; had first blood transfusion 10/17. Patient recently went to [facility] for blood work and has blood transfusion scheduled tomorrow 10/25/23. Patient also had [facility] appointment recently 10/20 for prosthetic adjustment and has another appointment with [facility]  ~Nov 8th. Since lats progress note, patient has been doing well around the home; states some exercises are harder to perform due to fatigue. NPRS levels have been 7/10 up to 10/10 due to phantom limb pain which lasts 5-20min at a time. Patient would like to continue to improve strength.       Progress Note 09/10/23: Patient has recently been to [facility] to have prosthetic was adjusted; this lasted 2-3 days until the leg began to rotate inwards again. Patient reports still have issues with general mobility and phantom limb pain. The phantom limb pain can get very sharp/intense. Patient has PCP follow up soon.    Eval: Patient states he did a lot of therapy at the hospital following amputation. He has been using RW to get around. He got his prosthetic July 19th. Patient is frustrated with his fatigue, gait speed, dressing difficulties. Is mostly just walking around in the house at this point. Has to take breaks with walking in the house due to fatiuge. He enjoys baking cakes. Has to encourage himself to use leg more. Has not used leg 8 hours yet. Had some wounds prior to amputation. Has some phantom pain.   PERTINENT HISTORY: R AKA 03/09/23, admitted [facility] 03/13/23-03/26/23, HTN, HLD, hx prostate cancer PAIN:  Are you having pain? Yes:  NPRS scale: 7/10 Pain location: R quad region Pain description: sharp Aggravating factors: constant Relieving factors: movement, meds  PRECAUTIONS: Fall  WEIGHT BEARING RESTRICTIONS: No  FALLS:  Has patient fallen in last 6 months? No  Patient Survey  Lower Extremity Functional Scale (LEFS)  14 / 80 = 17.5 % on 09/10/23  (10/24/23) Lower Extremity Functional Score: 31 / 80 = 38.8 %     OCCUPATION: Out on leave  PLOF: Independent  PATIENT GOALS: to be walking better like he isn't using a prosthetic     OBJECTIVE: (objective measures from initial evaluation unless otherwise dated)   COGNITION: Overall cognitive status: Within functional limits for tasks assessed     SENSATION: WFL   POSTURE: No Significant postural limitations  PALPATION: No significant limitations  LOWER EXTREMITY ROM: slightly decreased R hip extension ROM    LOWER EXTREMITY MMT:  MMT Right eval Left eval Right  9/16 Left 9/16 Right  10/24/23  Hip flexion 4 (limited range in seated) 4+ 4 4+ 4  Hip extension 3- 3+ 3 3+ 3+  Hip abduction  3- 4- 3 4- 3  Hip adduction       Hip internal rotation       Hip external rotation       Knee flexion  5  5   Knee extension  5  5   Ankle dorsiflexion  5  5   Ankle plantarflexion       Ankle inversion       Ankle eversion        (Blank rows = not tested) *= pain/symptoms    FUNCTIONAL TESTS:  5 times sit to stand: 53.94 seconds with UE support on chair and RW, relies LLE Timed up and go (TUG): 2: 18 with RW (includes time to release air from prosthetic upon standing) 2 minute walk test: 24 feet with RW  09/09/13  5 times sit to stand: 35s seconds with UE support on chair and RW, Timed up and go (TUG): 61m22s with rolling walker; includes time to let air out of prosthetic 2 minute walk test:  43 feet with rolling walker  Progress note #2 10/24/23   : 63 feet with rolling walker, stand-by assist   5TSTS: 30.25s: moderate UE use;  partial full sit to stand(10/29/23)  TUG: 8m17secs (10/29/23)  GAIT: Distance walked: 43 feet Assistive device utilized: Walker - 2 wheeled Level of assistance: SBA Comments: , step to pattern  *08/16/23: Patient ambulates using a step to gait pattern in rolling walker. Minimal increase in 1) lateral heel whip and 2) circumduction on right lower extremity during swing phase  10/29/23: Patient ambulates with rolling walker mimicking a 2-point step through pattern; moderate UE assistance on rolling walker; minimal internal rotation of residual limb during Swing Phase    TODAY'S TREATMENT:                                                                                                                              DATE:  11/01/23 Standing in // bars  Tandem balance on 4" step, 5-10" holds   Weighted rolling walker ambulation in clinic, stand-by assist   S2S from elevated surface     10/29/23 Objective tests & measures  5TSTS  TUG  Seated hip marches   10/24/23 PT Progress Note- see above  10/18/23 Standing in // bars  S2S + weight shift of elevated surface   Tandem wt shift onto right lower extremity into Stance Phase Lateral walks in // bars   Seated  Hip marches with 3# 2x10 Gait Training   2 pt step-to with large base cane, PT contact guard        10/18/23 Standing in // bars  Bilateral hip abduction with 3# 2x10 Bilateral hip flexion + weight shifts with 3# 2x10  S2S + weight shift of elevated surface   Tandem wt shift onto right lower extremity into Stance Phase \ Seated  Hip marches with 3# 2x10      10/04/23 -Standing hip abduction in // bars  with 2#  -Partial step ups 2" step in // bars, minimal assist  -Seated hip marches with 2#  -Partial sit to stands     PATIENT EDUCATION:  Education details: Patient educated on exam findings, POC, scope of PT, HEP, and prone lying to promote hip extension, skin checks. Person educated: Patient Education  method: Explanation, Demonstration, and Handouts Education comprehension: verbalized understanding, returned demonstration, verbal cues required, and tactile cues required  HOME EXERCISE PROGRAM: Access Code: [email or web address] URL: [email or web address]  Date: 08/09/2023 - Prone on Elbows Stretch  - 3 x daily - 7 x weekly - 10-20 minutes hold - Standing March with Counter Support  - 3 x daily - 7 x weekly - 2 sets - 10 reps - Standing Hip Abduction with Counter Support  - 3 x daily - 7 x weekly - 2 sets - 10 reps - Standing Hip Extension with Counter Support  - 3 x daily - 7 x weekly - 2 sets - 10 reps  ASSESSMENT:  CLINICAL IMPRESSION:  Today: PT began session with lower extremity strength/ balance. Patient displays moderate fatigue levels today; hear rate up to ~110 during gait, <100 at rest, no signs of low/high blood pressure. Patient required intermittent seated rest breaks due to fatigue. Patient tolerated well with no increase in pain levels   Patient will benefit from skilled physical therapy in order to improve function and reduce impairment.    Progress note 10/24/23: Patient a 72 y.o. y.o. male who was seen today for physical therapy progress note for s/p R AKA. Patient has shown improvements in overall distance(up to 63 feet from 43), LEFs Score(up to ~38%) and lower extremity strength for MMT.  Patient still presents with deficits in LE strength, pain levels endurance, activity tolerance, gait, balance, and functional mobility with ADL. Patient is having to modify and restrict ADL as indicated by outcome measure score as well as subjective information and objective measures which is affecting overall participation.    09/10/23: Patient a 72 y.o. y.o. male who was seen today for physical therapy progress note for s/p R AKA. Patient has shown improvements in overall TUG speed, distance, and 5TSTS measures by ~50%. Patient still presents with deficits in LE strength,  ROM, endurance, activity tolerance, gait, balance, and functional mobility with ADL. Patient is having to modify and restrict ADL as indicated by outcome measure score as well as subjective information and objective measures which is affecting overall participation. Patient will benefit from skilled physical therapy in order to improve function and reduce impairment.  OBJECTIVE IMPAIRMENTS: Abnormal gait, decreased activity tolerance, decreased balance, decreased mobility, difficulty walking, decreased ROM, decreased strength, increased muscle spasms, impaired flexibility, and improper body mechanics.   ACTIVITY LIMITATIONS: carrying, lifting, bending, standing, squatting, stairs, transfers, bathing, locomotion level, and caring for others  PARTICIPATION LIMITATIONS: meal prep, cleaning, laundry, shopping, community activity, and yard work  PERSONAL FACTORS: Fitness and 3+ comorbidities: R AKA, HTN, HLD  are also affecting patient's functional outcome.   REHAB POTENTIAL: Good  CLINICAL DECISION MAKING: Stable/uncomplicated  EVALUATION COMPLEXITY: Low   GOALS: Goals reviewed with patient? Yes  SHORT TERM GOALS: Target date: 08/30/2023    Patient will be independent with HEP in order to improve functional outcomes. Baseline:  Goal status: goal met   2.  Patient will report at least 25% improvement in symptoms for improved quality of life. Baseline:  Goal status: goal met     LONG TERM GOALS: Target date: 09/20/2023 --> 11/21/23(updated  10/24/23)    Patient will report at least 75% improvement in symptoms for improved quality of life. Baseline:  Goal status: goal met   2.   Patient will demonstrate grade of 5/5 MMT grade in all tested musculature as evidence of improved strength to assist with stair ambulation and gait.   Baseline: see above Goal status: in progress 10/24/23  3.  Patient will be able to ambulate at least 100 feet in  with LRAD in order to demonstrate  improved gait speed for community ambulation. Baseline: 24 feet with RW Goal status:  in progress 10/24/23  4.  Patient will be able to complete TUG in under 24.3 seconds in order to demonstrate improved mobility to reduce risk for falls. Baseline: 2:18 with RW (includes time to release air from prosthetic upon standing) Goal status:  in progress 10/24/23  5.  Patient will be able to complete 5x STS in under 20 seconds in order to demo improved functional strength. Baseline: 53.94 seconds with UE support on chair and RW, relies LLE Goal status: in progress 10/24/23     PLAN:  PT FREQUENCY: 1-2x/week  PT DURATION: 4 weeks  PLANNED INTERVENTIONS: Therapeutic exercises, Therapeutic activity, Neuromuscular re-education, Balance training, Gait training, Patient/Family education, Joint manipulation, Joint mobilization, Stair training, Orthotic/Fit training, DME instructions, Aquatic Therapy, Dry Needling, Electrical stimulation, Spinal manipulation, Spinal mobilization, Cryotherapy, Moist heat, Compression bandaging, scar mobilization, Splintting, Taping, Traction, Ultrasound, Ionotophoresis 4mg /ml Dexamethasone, and Manual therapy  PLAN FOR NEXT SESSION: hip mobility and hip strength, functional strength, balance and gait training, endurance   Seymour Bars PT, DPT

## 2023-11-02 ENCOUNTER — Encounter: Payer: Medicare Other | Admitting: Physical Medicine and Rehabilitation

## 2023-11-05 ENCOUNTER — Ambulatory Visit (HOSPITAL_COMMUNITY): Payer: Medicare Other

## 2023-11-05 ENCOUNTER — Inpatient Hospital Stay: Payer: Medicare Other | Admitting: Physician Assistant

## 2023-11-05 DIAGNOSIS — M6281 Muscle weakness (generalized): Secondary | ICD-10-CM | POA: Diagnosis not present

## 2023-11-05 DIAGNOSIS — R2689 Other abnormalities of gait and mobility: Secondary | ICD-10-CM | POA: Diagnosis not present

## 2023-11-05 DIAGNOSIS — Z89611 Acquired absence of right leg above knee: Secondary | ICD-10-CM

## 2023-11-05 NOTE — Therapy (Signed)
OUTPATIENT PHYSICAL THERAPY LOWER EXTREMITY TREATMENT   Patient Name: [name] MRN: [id number] DOB:[birth year], 72 y.o., male Today's Date: 11/05/2023  END OF SESSION:  PT End of Session - 11/05/23 0813     Visit Number 22    Number of Visits 25    Date for PT Re-Evaluation 09/20/23    Authorization Type [employer] Medicare    Authorization Time Period 11/11 -11/27    Progress Note Due on Visit 25    PT Start Time 0810    PT Stop Time 0842    PT Time Calculation (min) 32 min    Activity Tolerance Patient tolerated treatment well;Patient limited by fatigue    Behavior During Therapy [facility] for tasks assessed/performed                 Past Medical History:  Diagnosis Date   Family history of metabolic acidosis with increased anion gap 03/05/2023   GERD (gastroesophageal reflux disease)    History of kidney stones    Hyperlipidemia    Hypertension    Prostate cancer [facility]) 2011   radiation june 2020   Umbilical hernia    Wears dentures    full   Wears glasses    Wears partial dentures    lower   Past Surgical History:  Procedure Laterality Date   ABDOMINAL AORTOGRAM W/LOWER EXTREMITY N/A 11/20/2022   Procedure: ABDOMINAL AORTOGRAM W/LOWER EXTREMITY;  Surgeon: Maeola Harman, MD;  Location: [facility] INVASIVE CV LAB;  Service: Cardiovascular;  Laterality: N/A;   ABDOMINAL AORTOGRAM W/LOWER EXTREMITY N/A 03/05/2023   Procedure: ABDOMINAL AORTOGRAM W/LOWER EXTREMITY;  Surgeon: Maeola Harman, MD;  Location: [facility] INVASIVE CV LAB;  Service: Cardiovascular;  Laterality: N/A;   AMPUTATION Right 03/09/2023   Procedure: AMPUTATION ABOVE KNEE;  Surgeon: Maeola Harman, MD;  Location: [facility] OR;  Service: Vascular;  Laterality: Right;   COLONOSCOPY N/A 08/25/2020   Procedure: COLONOSCOPY;  Surgeon: Corbin Ade, MD;  Location: [facility];  Service: Endoscopy;  Laterality: N/A;  9:30   COLONOSCOPY     CYSTOSCOPY WITH LITHOLAPAXY N/A 05/18/2021    Procedure: CYSTOSCOPY WITH LITHOLAPAXY;  Surgeon: Rene Paci, MD;  Location: [facility];  Service: Urology;  Laterality: N/A;  ONLY NEEDS  30 MIN   FEMORAL-TIBIAL BYPASS GRAFT Right 12/12/2022   Procedure: RIGHT COMMON FEMORAL-POSTERIOR TIBIAL ARTERY BYPASS WITH VEIN HARVESTING OF THE GREATER SAPHENOUS VEIN AND FEMORAL ENDARTERECTOMY WITH COMPOSITE GRAFT;  Surgeon: Maeola Harman, MD;  Location: [facility] OR;  Service: Vascular;  Laterality: Right;   POLYPECTOMY  08/25/2020   Procedure: POLYPECTOMY;  Surgeon: Corbin Ade, MD;  Location: [facility];  Service: Endoscopy;;   ROBOT ASSISTED LAPAROSCOPIC RADICAL PROSTATECTOMY  2011   TONSILLECTOMY  age 32   and adenoids removed   Patient Active Problem List   Diagnosis Date Noted   Anemia 10/19/2023   Abdominal pain 10/10/2023   Cough 10/10/2023   Abnormal gait 07/27/2023   Hypertension 07/10/2023   Blister of left leg 05/24/2023   Acute blood loss anemia 03/27/2023   Phantom pain after amputation of lower extremity (HCC) 03/27/2023   Adjustment disorder with mixed anxiety and depressed mood 03/15/2023   Above knee amputation of right lower extremity (HCC) 03/13/2023   CKD (chronic kidney disease) stage 3, GFR 30-59 ml/min (HCC) 03/13/2023   Cellulitis of right lower extremity 03/09/2023   Fever 03/09/2023   Non-traumatic rhabdomyolysis 03/09/2023   Bandemia 03/09/2023   Acute kidney injury  superimposed on chronic kidney disease (HCC) 03/05/2023   Hyperkalemia 03/05/2023   Normocytic anemia 03/05/2023   History of prostate cancer 03/05/2023   Essential hypertension 03/05/2023   Metabolic acidosis, increased anion gap 03/05/2023   Critical limb ischemia of right lower extremity (HCC) 12/12/2022   Malignant neoplasm of prostate (HCC) 03/18/2019    PCP: Rica Records FNP  REFERRING PROVIDER: Genice Rouge, MD  REFERRING DIAG: [phone] (ICD-10-CM) - S/P AKA (above knee amputation), right  (HCC)  THERAPY DIAG:  No diagnosis found.  Rationale for Evaluation and Treatment: Rehabilitation  ONSET DATE: 03/09/23  SUBJECTIVE:   SUBJECTIVE STATEMENT:  Today: Patient states his residual limb feels as if shrunk; slips in the prosthetic. Will have [facility] appt this week   Progress Note(10/24/23): Patient has been going to [facility]; had first blood transfusion 10/17. Patient recently went to [facility] for blood work and has blood transfusion scheduled tomorrow 10/25/23. Patient also had [facility] appointment recently 10/20 for prosthetic adjustment and has another appointment with [facility]  ~Nov 8th. Since lats progress note, patient has been doing well around the home; states some exercises are harder to perform due to fatigue. NPRS levels have been 7/10 up to 10/10 due to phantom limb pain which lasts 5-29min at a time. Patient would like to continue to improve strength.       Progress Note 09/10/23: Patient has recently been to [facility] to have prosthetic was adjusted; this lasted 2-3 days until the leg began to rotate inwards again. Patient reports still have issues with general mobility and phantom limb pain. The phantom limb pain can get very sharp/intense. Patient has PCP follow up soon.    Eval: Patient states he did a lot of therapy at the hospital following amputation. He has been using RW to get around. He got his prosthetic July 19th. Patient is frustrated with his fatigue, gait speed, dressing difficulties. Is mostly just walking around in the house at this point. Has to take breaks with walking in the house due to fatiuge. He enjoys baking cakes. Has to encourage himself to use leg more. Has not used leg 8 hours yet. Had some wounds prior to amputation. Has some phantom pain.   PERTINENT HISTORY: R AKA 03/09/23, admitted [facility] 03/13/23-03/26/23, HTN, HLD, hx prostate cancer PAIN:  Are you having pain? Yes: NPRS scale: 7/10 Pain location: R quad region Pain description:  sharp Aggravating factors: constant Relieving factors: movement, meds  PRECAUTIONS: Fall  WEIGHT BEARING RESTRICTIONS: No  FALLS:  Has patient fallen in last 6 months? No  Patient Survey  Lower Extremity Functional Scale (LEFS)  14 / 80 = 17.5 % on 09/10/23  (10/24/23) Lower Extremity Functional Score: 31 / 80 = 38.8 %     OCCUPATION: Out on leave  PLOF: Independent  PATIENT GOALS: to be walking better like he isn't using a prosthetic     OBJECTIVE: (objective measures from initial evaluation unless otherwise dated)   COGNITION: Overall cognitive status: Within functional limits for tasks assessed     SENSATION: WFL   POSTURE: No Significant postural limitations  PALPATION: No significant limitations  LOWER EXTREMITY ROM: slightly decreased R hip extension ROM    LOWER EXTREMITY MMT:  MMT Right eval Left eval Right  9/16 Left 9/16 Right  10/24/23  Hip flexion 4 (limited range in seated) 4+ 4 4+ 4  Hip extension 3- 3+ 3 3+ 3+  Hip abduction 3- 4- 3 4- 3  Hip adduction  Hip internal rotation       Hip external rotation       Knee flexion  5  5   Knee extension  5  5   Ankle dorsiflexion  5  5   Ankle plantarflexion       Ankle inversion       Ankle eversion        (Blank rows = not tested) *= pain/symptoms    FUNCTIONAL TESTS:  5 times sit to stand: 53.94 seconds with UE support on chair and RW, relies LLE Timed up and go (TUG): 2: 18 with RW (includes time to release air from prosthetic upon standing) 2 minute walk test: 24 feet with RW  09/09/13  5 times sit to stand: 35s seconds with UE support on chair and RW, Timed up and go (TUG): 70m22s with rolling walker; includes time to let air out of prosthetic 2 minute walk test:  43 feet with rolling walker  Progress note #2 10/24/23   : 63 feet with rolling walker, stand-by assist   5TSTS: 30.25s: moderate UE use; partial full sit to stand(10/29/23)  TUG: 46m17secs  (10/29/23)  GAIT: Distance walked: 43 feet Assistive device utilized: Walker - 2 wheeled Level of assistance: SBA Comments: , step to pattern  *08/16/23: Patient ambulates using a step to gait pattern in rolling walker. Minimal increase in 1) lateral heel whip and 2) circumduction on right lower extremity during swing phase  10/29/23: Patient ambulates with rolling walker mimicking a 2-point step through pattern; moderate UE assistance on rolling walker; minimal internal rotation of residual limb during Swing Phase    TODAY'S TREATMENT:                                                                                                                              DATE:  11/05/23 Seated  Hip marches with 2#  Standing in // bars   Hip abduction with 2# 2x5 reps, bilateral   Wt shifting in tandem, contact guard assist   Gait Training   Gait around clinic + rolling walker, stand-by assist   11/01/23 Standing in // bars  Tandem balance on 4" step, 5-10" holds   Weighted rolling walker ambulation in clinic, stand-by assist   S2S from elevated surface    10/29/23 Objective tests & measures  5TSTS  TUG  Seated hip marches   10/24/23 PT Progress Note- see above  10/18/23 Standing in // bars  S2S + weight shift of elevated surface   Tandem wt shift onto right lower extremity into Stance Phase Lateral walks in // bars   Seated  Hip marches with 3# 2x10 Gait Training   2 pt step-to with large base cane, PT contact guard        10/18/23 Standing in // bars  Bilateral hip abduction with 3# 2x10 Bilateral hip flexion + weight shifts with 3# 2x10  S2S + weight shift of elevated surface  Tandem wt shift onto right lower extremity into Stance Phase \ Seated  Hip marches with 3# 2x10     PATIENT EDUCATION:  Education details: Patient educated on exam findings, POC, scope of PT, HEP, and prone lying to promote hip extension, skin checks. Person educated: Patient Education  method: Explanation, Demonstration, and Handouts Education comprehension: verbalized understanding, returned demonstration, verbal cues required, and tactile cues required  HOME EXERCISE PROGRAM: Access Code: [email or web address] URL: [email or web address]  Date: 08/09/2023 - Prone on Elbows Stretch  - 3 x daily - 7 x weekly - 10-20 minutes hold - Standing March with Counter Support  - 3 x daily - 7 x weekly - 2 sets - 10 reps - Standing Hip Abduction with Counter Support  - 3 x daily - 7 x weekly - 2 sets - 10 reps - Standing Hip Extension with Counter Support  - 3 x daily - 7 x weekly - 2 sets - 10 reps  ASSESSMENT:  CLINICAL IMPRESSION:  Today(patient arrived 10 min late): PT began session with lower extremity strength/ balance. Patient with increased levels of fatigue, needs more seated breaks in between sets today but has no pain with therapeutic exercise,  Patient will benefit from skilled physical therapy in order to improve function and reduce impairment.    Progress note 10/24/23: Patient a 72 y.o. y.o. male who was seen today for physical therapy progress note for s/p R AKA. Patient has shown improvements in overall distance(up to 63 feet from 43), LEFs Score(up to ~38%) and lower extremity strength for MMT.  Patient still presents with deficits in LE strength, pain levels endurance, activity tolerance, gait, balance, and functional mobility with ADL. Patient is having to modify and restrict ADL as indicated by outcome measure score as well as subjective information and objective measures which is affecting overall participation.    09/10/23: Patient a 72 y.o. y.o. male who was seen today for physical therapy progress note for s/p R AKA. Patient has shown improvements in overall TUG speed, distance, and 5TSTS measures by ~50%. Patient still presents with deficits in LE strength, ROM, endurance, activity tolerance, gait, balance, and functional mobility with ADL. Patient  is having to modify and restrict ADL as indicated by outcome measure score as well as subjective information and objective measures which is affecting overall participation. Patient will benefit from skilled physical therapy in order to improve function and reduce impairment.  OBJECTIVE IMPAIRMENTS: Abnormal gait, decreased activity tolerance, decreased balance, decreased mobility, difficulty walking, decreased ROM, decreased strength, increased muscle spasms, impaired flexibility, and improper body mechanics.   ACTIVITY LIMITATIONS: carrying, lifting, bending, standing, squatting, stairs, transfers, bathing, locomotion level, and caring for others  PARTICIPATION LIMITATIONS: meal prep, cleaning, laundry, shopping, community activity, and yard work  PERSONAL FACTORS: Fitness and 3+ comorbidities: R AKA, HTN, HLD  are also affecting patient's functional outcome.   REHAB POTENTIAL: Good  CLINICAL DECISION MAKING: Stable/uncomplicated  EVALUATION COMPLEXITY: Low   GOALS: Goals reviewed with patient? Yes  SHORT TERM GOALS: Target date: 08/30/2023    Patient will be independent with HEP in order to improve functional outcomes. Baseline:  Goal status: goal met   2.  Patient will report at least 25% improvement in symptoms for improved quality of life. Baseline:  Goal status: goal met     LONG TERM GOALS: Target date: 09/20/2023 --> 11/21/23(updated 10/24/23)    Patient will report at least 75% improvement in symptoms for improved quality of life. Baseline:  Goal status:  goal met   2.   Patient will demonstrate grade of 5/5 MMT grade in all tested musculature as evidence of improved strength to assist with stair ambulation and gait.   Baseline: see above Goal status: in progress 10/24/23  3.  Patient will be able to ambulate at least 100 feet in  with LRAD in order to demonstrate improved gait speed for community ambulation. Baseline: 24 feet with RW Goal status:  in progress  10/24/23  4.  Patient will be able to complete TUG in under 24.3 seconds in order to demonstrate improved mobility to reduce risk for falls. Baseline: 2:18 with RW (includes time to release air from prosthetic upon standing) Goal status:  in progress 10/24/23  5.  Patient will be able to complete 5x STS in under 20 seconds in order to demo improved functional strength. Baseline: 53.94 seconds with UE support on chair and RW, relies LLE Goal status: in progress 10/24/23     PLAN:  PT FREQUENCY: 1-2x/week  PT DURATION: 4 weeks  PLANNED INTERVENTIONS: Therapeutic exercises, Therapeutic activity, Neuromuscular re-education, Balance training, Gait training, Patient/Family education, Joint manipulation, Joint mobilization, Stair training, Orthotic/Fit training, DME instructions, Aquatic Therapy, Dry Needling, Electrical stimulation, Spinal manipulation, Spinal mobilization, Cryotherapy, Moist heat, Compression bandaging, scar mobilization, Splintting, Taping, Traction, Ultrasound, Ionotophoresis 4mg /ml Dexamethasone, and Manual therapy  PLAN FOR NEXT SESSION: hip mobility and hip strength, functional strength, balance and gait training, endurance   Seymour Bars PT, DPT

## 2023-11-06 DIAGNOSIS — D631 Anemia in chronic kidney disease: Secondary | ICD-10-CM | POA: Diagnosis not present

## 2023-11-06 DIAGNOSIS — N179 Acute kidney failure, unspecified: Secondary | ICD-10-CM | POA: Diagnosis not present

## 2023-11-06 DIAGNOSIS — E1122 Type 2 diabetes mellitus with diabetic chronic kidney disease: Secondary | ICD-10-CM | POA: Diagnosis not present

## 2023-11-06 DIAGNOSIS — N1831 Chronic kidney disease, stage 3a: Secondary | ICD-10-CM | POA: Diagnosis not present

## 2023-11-06 DIAGNOSIS — Z87442 Personal history of urinary calculi: Secondary | ICD-10-CM | POA: Diagnosis not present

## 2023-11-06 DIAGNOSIS — Z89611 Acquired absence of right leg above knee: Secondary | ICD-10-CM | POA: Diagnosis not present

## 2023-11-06 DIAGNOSIS — I129 Hypertensive chronic kidney disease with stage 1 through stage 4 chronic kidney disease, or unspecified chronic kidney disease: Secondary | ICD-10-CM | POA: Diagnosis not present

## 2023-11-07 ENCOUNTER — Ambulatory Visit (HOSPITAL_COMMUNITY): Payer: Medicare Other

## 2023-11-07 DIAGNOSIS — Z89611 Acquired absence of right leg above knee: Secondary | ICD-10-CM | POA: Diagnosis not present

## 2023-11-07 DIAGNOSIS — M6281 Muscle weakness (generalized): Secondary | ICD-10-CM | POA: Diagnosis not present

## 2023-11-07 DIAGNOSIS — R2689 Other abnormalities of gait and mobility: Secondary | ICD-10-CM

## 2023-11-07 DIAGNOSIS — N1831 Chronic kidney disease, stage 3a: Secondary | ICD-10-CM | POA: Diagnosis not present

## 2023-11-07 NOTE — Therapy (Signed)
OUTPATIENT PHYSICAL THERAPY LOWER EXTREMITY TREATMENT   Patient Name: [name] MRN: [id number] DOB:06/13/1951, 72 y.o., male Today's Date: 11/07/2023  END OF SESSION:  PT End of Session - 11/07/23 0818     Visit Number 23    Number of Visits 25    Date for PT Re-Evaluation 09/20/23    Authorization Type [employer] Medicare    Authorization Time Period 11/11 -11/27    Progress Note Due on Visit 25    PT Start Time 0815    PT Stop Time 0845    PT Time Calculation (min) 30 min    Activity Tolerance Patient tolerated treatment well;Patient limited by fatigue    Behavior During Therapy [facility] for tasks assessed/performed                 Past Medical History:  Diagnosis Date   Family history of metabolic acidosis with increased anion gap 03/05/2023   GERD (gastroesophageal reflux disease)    History of kidney stones    Hyperlipidemia    Hypertension    Prostate cancer [facility]) 2011   radiation june 2020   Umbilical hernia    Wears dentures    full   Wears glasses    Wears partial dentures    lower   Past Surgical History:  Procedure Laterality Date   ABDOMINAL AORTOGRAM W/LOWER EXTREMITY N/A 11/20/2022   Procedure: ABDOMINAL AORTOGRAM W/LOWER EXTREMITY;  Surgeon: Maeola Harman, MD;  Location: [facility] INVASIVE CV LAB;  Service: Cardiovascular;  Laterality: N/A;   ABDOMINAL AORTOGRAM W/LOWER EXTREMITY N/A 03/05/2023   Procedure: ABDOMINAL AORTOGRAM W/LOWER EXTREMITY;  Surgeon: Maeola Harman, MD;  Location: [facility] INVASIVE CV LAB;  Service: Cardiovascular;  Laterality: N/A;   AMPUTATION Right 03/09/2023   Procedure: AMPUTATION ABOVE KNEE;  Surgeon: Maeola Harman, MD;  Location: [facility] OR;  Service: Vascular;  Laterality: Right;   COLONOSCOPY N/A 08/25/2020   Procedure: COLONOSCOPY;  Surgeon: Corbin Ade, MD;  [facility];  Service: Endoscopy;  Laterality: N/A;  9:30   COLONOSCOPY     CYSTOSCOPY WITH LITHOLAPAXY N/A 05/18/2021    Procedure: CYSTOSCOPY WITH LITHOLAPAXY;  Surgeon: Rene Paci, MD;  Location: [facility];  Service: Urology;  Laterality: N/A;  ONLY NEEDS  30 MIN   FEMORAL-TIBIAL BYPASS GRAFT Right 12/12/2022   Procedure: RIGHT COMMON FEMORAL-POSTERIOR TIBIAL ARTERY BYPASS WITH VEIN HARVESTING OF THE GREATER SAPHENOUS VEIN AND FEMORAL ENDARTERECTOMY WITH COMPOSITE GRAFT;  Surgeon: Maeola Harman, MD;  Location: [facility] OR;  Service: Vascular;  Laterality: Right;   POLYPECTOMY  08/25/2020   Procedure: POLYPECTOMY;  Surgeon: Corbin Ade, MD;  Location: [facility];  Service: Endoscopy;;   ROBOT ASSISTED LAPAROSCOPIC RADICAL PROSTATECTOMY  2011   TONSILLECTOMY  age 46   and adenoids removed   Patient Active Problem List   Diagnosis Date Noted   Anemia 10/19/2023   Abdominal pain 10/10/2023   Cough 10/10/2023   Abnormal gait 07/27/2023   Hypertension 07/10/2023   Blister of left leg 05/24/2023   Acute blood loss anemia 03/27/2023   Phantom pain after amputation of lower extremity (HCC) 03/27/2023   Adjustment disorder with mixed anxiety and depressed mood 03/15/2023   Above knee amputation of right lower extremity (HCC) 03/13/2023   CKD (chronic kidney disease) stage 3, GFR 30-59 ml/min (HCC) 03/13/2023   Cellulitis of right lower extremity 03/09/2023   Fever 03/09/2023   Non-traumatic rhabdomyolysis 03/09/2023   Bandemia 03/09/2023   Acute kidney injury  superimposed on chronic kidney disease (HCC) 03/05/2023   Hyperkalemia 03/05/2023   Normocytic anemia 03/05/2023   History of prostate cancer 03/05/2023   Essential hypertension 03/05/2023   Metabolic acidosis, increased anion gap 03/05/2023   Critical limb ischemia of right lower extremity (HCC) 12/12/2022   Malignant neoplasm of prostate (HCC) 03/18/2019    PCP: Rica Records FNP  REFERRING PROVIDER: Genice Rouge, MD  REFERRING DIAG: [phone] (ICD-10-CM) - S/P AKA (above knee amputation), right  (HCC)  THERAPY DIAG:  Other abnormalities of gait and mobility  Muscle weakness (generalized)  S/P AKA (above knee amputation), right (HCC)  Rationale for Evaluation and Treatment: Rehabilitation  ONSET DATE: 03/09/23  SUBJECTIVE:   SUBJECTIVE STATEMENT:  Today: Patient had prosthetist appointment 11/05/23; patient was given a belt for residual limb around hips, states theres more stability   Progress Note(10/24/23): Patient has been going to [facility]; had first blood transfusion 10/17. Patient recently went to [facility] for blood work and has blood transfusion scheduled tomorrow 10/25/23. Patient also had [facility] appointment recently 10/20 for prosthetic adjustment and has another appointment with [facility]  ~Nov 8th. Since lats progress note, patient has been doing well around the home; states some exercises are harder to perform due to fatigue. NPRS levels have been 7/10 up to 10/10 due to phantom limb pain which lasts 5-55min at a time. Patient would like to continue to improve strength.       Progress Note 09/10/23: Patient has recently been to [facility] to have prosthetic was adjusted; this lasted 2-3 days until the leg began to rotate inwards again. Patient reports still have issues with general mobility and phantom limb pain. The phantom limb pain can get very sharp/intense. Patient has PCP follow up soon.    Eval: Patient states he did a lot of therapy at the hospital following amputation. He has been using RW to get around. He got his prosthetic July 19th. Patient is frustrated with his fatigue, gait speed, dressing difficulties. Is mostly just walking around in the house at this point. Has to take breaks with walking in the house due to fatiuge. He enjoys baking cakes. Has to encourage himself to use leg more. Has not used leg 8 hours yet. Had some wounds prior to amputation. Has some phantom pain.   PERTINENT HISTORY: R AKA 03/09/23, admitted CIR 03/13/23-03/26/23, HTN, HLD, hx  prostate cancer PAIN:  Are you having pain? Yes: NPRS scale: 7/10 Pain location: R quad region Pain description: sharp Aggravating factors: constant Relieving factors: movement, meds  PRECAUTIONS: Fall  WEIGHT BEARING RESTRICTIONS: No  FALLS:  Has patient fallen in last 6 months? No  Patient Survey  Lower Extremity Functional Scale (LEFS)  14 / 80 = 17.5 % on 09/10/23  (10/24/23) Lower Extremity Functional Score: 31 / 80 = 38.8 %     OCCUPATION: Out on leave  PLOF: Independent  PATIENT GOALS: to be walking better like he isn't using a prosthetic     OBJECTIVE: (objective measures from initial evaluation unless otherwise dated)   COGNITION: Overall cognitive status: Within functional limits for tasks assessed     SENSATION: WFL   POSTURE: No Significant postural limitations  PALPATION: No significant limitations  LOWER EXTREMITY ROM: slightly decreased R hip extension ROM    LOWER EXTREMITY MMT:  MMT Right eval Left eval Right  9/16 Left 9/16 Right  10/24/23  Hip flexion 4 (limited range in seated) 4+ 4 4+ 4  Hip extension 3- 3+ 3  3+ 3+  Hip abduction 3- 4- 3 4- 3  Hip adduction       Hip internal rotation       Hip external rotation       Knee flexion  5  5   Knee extension  5  5   Ankle dorsiflexion  5  5   Ankle plantarflexion       Ankle inversion       Ankle eversion        (Blank rows = not tested) *= pain/symptoms    FUNCTIONAL TESTS:  5 times sit to stand: 53.94 seconds with UE support on chair and RW, relies LLE Timed up and go (TUG): 2: 18 with RW (includes time to release air from prosthetic upon standing) 2 minute walk test: 24 feet with RW  09/09/13  5 times sit to stand: 35s seconds with UE support on chair and RW, Timed up and go (TUG): 45m22s with rolling walker; includes time to let air out of prosthetic 2 minute walk test:  43 feet with rolling walker  Progress note #2 10/24/23   : 63 feet with rolling walker,  stand-by assist   5TSTS: 30.25s: moderate UE use; partial full sit to stand(10/29/23)  TUG: 61m17secs (10/29/23)  GAIT: Distance walked: 43 feet Assistive device utilized: Walker - 2 wheeled Level of assistance: SBA Comments: , step to pattern  *08/16/23: Patient ambulates using a step to gait pattern in rolling walker. Minimal increase in 1) lateral heel whip and 2) circumduction on right lower extremity during swing phase  10/29/23: Patient ambulates with rolling walker mimicking a 2-point step through pattern; moderate UE assistance on rolling walker; minimal internal rotation of residual limb during Swing Phase    TODAY'S TREATMENT:                                                                                                                              DATE:  11/07/23 Standing in // bars   Hip abduction, flexion with 2# 2x5 reps, bilateral   S2S's + elevated chair   Tandem holds + 2" step, 3-5" contact guard assist Seated theraband Rows, black 2x10    11/05/23 Seated  Hip marches with 2#  Standing in // bars   Hip abduction with 2# 2x5 reps, bilateral   Wt shifting in tandem, contact guard assist   Gait Training   Gait around clinic + rolling walker, stand-by assist   11/01/23 Standing in // bars  Tandem balance on 4" step, 5-10" holds   Weighted rolling walker ambulation in clinic, stand-by assist   S2S from elevated surface    10/29/23 Objective tests & measures  5TSTS  TUG  Seated hip marches   10/24/23 PT Progress Note- see above  10/18/23 Standing in // bars  S2S + weight shift of elevated surface   Tandem wt shift onto right lower extremity into Stance Phase Lateral walks in // bars  Seated  Hip marches with 3# 2x10 Gait Training   2 pt step-to with large base cane, PT contact guard         PATIENT EDUCATION:  Education details: Patient educated on exam findings, POC, scope of PT, HEP, and prone lying to promote hip extension, skin  checks. Person educated: Patient Education method: Explanation, Demonstration, and Handouts Education comprehension: verbalized understanding, returned demonstration, verbal cues required, and tactile cues required  HOME EXERCISE PROGRAM: Access Code: [email or web address] URL: [email or web address]  Date: 08/09/2023 - Prone on Elbows Stretch  - 3 x daily - 7 x weekly - 10-20 minutes hold - Standing March with Counter Support  - 3 x daily - 7 x weekly - 2 sets - 10 reps - Standing Hip Abduction with Counter Support  - 3 x daily - 7 x weekly - 2 sets - 10 reps - Standing Hip Extension with Counter Support  - 3 x daily - 7 x weekly - 2 sets - 10 reps  ASSESSMENT:  CLINICAL IMPRESSION:  Today(patient arrived 15 min late): PT began session with lower extremity strength/ balance. Patient with increased levels of fatigue, needs more seated breaks in between sets today but has no pain with therapeutic exercise. PT focused on wt shifting in // bars after lower extremity strength, no pian   Patient will benefit from skilled physical therapy in order to improve function and reduce impairment.    Progress note 10/24/23: Patient a 72 y.o. y.o. male who was seen today for physical therapy progress note for s/p R AKA. Patient has shown improvements in overall distance(up to 63 feet from 43), LEFs Score(up to ~38%) and lower extremity strength for MMT.  Patient still presents with deficits in LE strength, pain levels endurance, activity tolerance, gait, balance, and functional mobility with ADL. Patient is having to modify and restrict ADL as indicated by outcome measure score as well as subjective information and objective measures which is affecting overall participation.    09/10/23: Patient a 72 y.o. y.o. male who was seen today for physical therapy progress note for s/p R AKA. Patient has shown improvements in overall TUG speed, distance, and 5TSTS measures by ~50%. Patient still presents  with deficits in LE strength, ROM, endurance, activity tolerance, gait, balance, and functional mobility with ADL. Patient is having to modify and restrict ADL as indicated by outcome measure score as well as subjective information and objective measures which is affecting overall participation. Patient will benefit from skilled physical therapy in order to improve function and reduce impairment.  OBJECTIVE IMPAIRMENTS: Abnormal gait, decreased activity tolerance, decreased balance, decreased mobility, difficulty walking, decreased ROM, decreased strength, increased muscle spasms, impaired flexibility, and improper body mechanics.   ACTIVITY LIMITATIONS: carrying, lifting, bending, standing, squatting, stairs, transfers, bathing, locomotion level, and caring for others  PARTICIPATION LIMITATIONS: meal prep, cleaning, laundry, shopping, community activity, and yard work  PERSONAL FACTORS: Fitness and 3+ comorbidities: R AKA, HTN, HLD  are also affecting patient's functional outcome.   REHAB POTENTIAL: Good  CLINICAL DECISION MAKING: Stable/uncomplicated  EVALUATION COMPLEXITY: Low   GOALS: Goals reviewed with patient? Yes  SHORT TERM GOALS: Target date: 08/30/2023    Patient will be independent with HEP in order to improve functional outcomes. Baseline:  Goal status: goal met   2.  Patient will report at least 25% improvement in symptoms for improved quality of life. Baseline:  Goal status: goal met     LONG TERM GOALS: Target date: 09/20/2023 --> 11/21/23(updated  10/24/23)    Patient will report at least 75% improvement in symptoms for improved quality of life. Baseline:  Goal status: goal met   2.   Patient will demonstrate grade of 5/5 MMT grade in all tested musculature as evidence of improved strength to assist with stair ambulation and gait.   Baseline: see above Goal status: in progress 10/24/23  3.  Patient will be able to ambulate at least 100 feet in  with LRAD  in order to demonstrate improved gait speed for community ambulation. Baseline: 24 feet with RW Goal status:  in progress 10/24/23  4.  Patient will be able to complete TUG in under 24.3 seconds in order to demonstrate improved mobility to reduce risk for falls. Baseline: 2:18 with RW (includes time to release air from prosthetic upon standing) Goal status:  in progress 10/24/23  5.  Patient will be able to complete 5x STS in under 20 seconds in order to demo improved functional strength. Baseline: 53.94 seconds with UE support on chair and RW, relies LLE Goal status: in progress 10/24/23     PLAN:  PT FREQUENCY: 1-2x/week  PT DURATION: 4 weeks  PLANNED INTERVENTIONS: Therapeutic exercises, Therapeutic activity, Neuromuscular re-education, Balance training, Gait training, Patient/Family education, Joint manipulation, Joint mobilization, Stair training, Orthotic/Fit training, DME instructions, Aquatic Therapy, Dry Needling, Electrical stimulation, Spinal manipulation, Spinal mobilization, Cryotherapy, Moist heat, Compression bandaging, scar mobilization, Splintting, Taping, Traction, Ultrasound, Ionotophoresis 4mg /ml Dexamethasone, and Manual therapy  PLAN FOR NEXT SESSION: hip mobility and hip strength, functional strength, balance and gait training, endurance   Seymour Bars PT, DPT

## 2023-11-12 ENCOUNTER — Encounter (HOSPITAL_COMMUNITY): Payer: Self-pay

## 2023-11-12 ENCOUNTER — Ambulatory Visit: Payer: Self-pay | Admitting: Family Medicine

## 2023-11-12 ENCOUNTER — Other Ambulatory Visit: Payer: Self-pay

## 2023-11-12 ENCOUNTER — Emergency Department (HOSPITAL_COMMUNITY): Payer: Medicare Other

## 2023-11-12 ENCOUNTER — Emergency Department (HOSPITAL_COMMUNITY)
Admission: EM | Admit: 2023-11-12 | Discharge: 2023-11-13 | Disposition: A | Discharge status: Home / Self Care | Payer: Medicare Other | Attending: Emergency Medicine | Admitting: Emergency Medicine

## 2023-11-12 ENCOUNTER — Ambulatory Visit (HOSPITAL_COMMUNITY): Payer: Medicare Other

## 2023-11-12 DIAGNOSIS — C799 Secondary malignant neoplasm of unspecified site: Secondary | ICD-10-CM | POA: Insufficient documentation

## 2023-11-12 DIAGNOSIS — Z20822 Contact with and (suspected) exposure to covid-19: Secondary | ICD-10-CM | POA: Insufficient documentation

## 2023-11-12 DIAGNOSIS — R590 Localized enlarged lymph nodes: Secondary | ICD-10-CM | POA: Diagnosis not present

## 2023-11-12 DIAGNOSIS — I700 Atherosclerosis of aorta: Secondary | ICD-10-CM | POA: Diagnosis not present

## 2023-11-12 DIAGNOSIS — C7971 Secondary malignant neoplasm of right adrenal gland: Secondary | ICD-10-CM | POA: Diagnosis not present

## 2023-11-12 DIAGNOSIS — R0602 Shortness of breath: Secondary | ICD-10-CM | POA: Insufficient documentation

## 2023-11-12 DIAGNOSIS — I10 Essential (primary) hypertension: Secondary | ICD-10-CM | POA: Insufficient documentation

## 2023-11-12 DIAGNOSIS — C61 Malignant neoplasm of prostate: Secondary | ICD-10-CM | POA: Insufficient documentation

## 2023-11-12 DIAGNOSIS — R531 Weakness: Secondary | ICD-10-CM | POA: Insufficient documentation

## 2023-11-12 DIAGNOSIS — R0600 Dyspnea, unspecified: Secondary | ICD-10-CM | POA: Insufficient documentation

## 2023-11-12 DIAGNOSIS — D649 Anemia, unspecified: Secondary | ICD-10-CM | POA: Diagnosis not present

## 2023-11-12 DIAGNOSIS — Z7982 Long term (current) use of aspirin: Secondary | ICD-10-CM | POA: Insufficient documentation

## 2023-11-12 DIAGNOSIS — C7972 Secondary malignant neoplasm of left adrenal gland: Secondary | ICD-10-CM | POA: Diagnosis not present

## 2023-11-12 DIAGNOSIS — C3490 Malignant neoplasm of unspecified part of unspecified bronchus or lung: Secondary | ICD-10-CM | POA: Diagnosis not present

## 2023-11-12 DIAGNOSIS — R600 Localized edema: Secondary | ICD-10-CM | POA: Insufficient documentation

## 2023-11-12 DIAGNOSIS — Z89611 Acquired absence of right leg above knee: Secondary | ICD-10-CM

## 2023-11-12 DIAGNOSIS — M6281 Muscle weakness (generalized): Secondary | ICD-10-CM

## 2023-11-12 DIAGNOSIS — R2689 Other abnormalities of gait and mobility: Secondary | ICD-10-CM

## 2023-11-12 DIAGNOSIS — Z8546 Personal history of malignant neoplasm of prostate: Secondary | ICD-10-CM | POA: Diagnosis not present

## 2023-11-12 DIAGNOSIS — C801 Malignant (primary) neoplasm, unspecified: Secondary | ICD-10-CM | POA: Diagnosis not present

## 2023-11-12 DIAGNOSIS — C7951 Secondary malignant neoplasm of bone: Secondary | ICD-10-CM | POA: Diagnosis not present

## 2023-11-12 LAB — COMPREHENSIVE METABOLIC PANEL
ALT: 30 U/L (ref 0–44)
AST: 31 U/L (ref 15–41)
Albumin: 2.5 g/dL — ABNORMAL LOW (ref 3.5–5.0)
Alkaline Phosphatase: 101 U/L (ref 38–126)
Anion gap: 12 (ref 5–15)
BUN: 37 mg/dL — ABNORMAL HIGH (ref 8–23)
CO2: 25 mmol/L (ref 22–32)
Calcium: 9.9 mg/dL (ref 8.9–10.3)
Chloride: 97 mmol/L — ABNORMAL LOW (ref 98–111)
Creatinine, Ser: 1.6 mg/dL — ABNORMAL HIGH (ref 0.61–1.24)
GFR, Estimated: 45 mL/min — ABNORMAL LOW (ref >60)
Glucose, Bld: 108 mg/dL — ABNORMAL HIGH (ref 70–99)
Potassium: 3.6 mmol/L (ref 3.5–5.1)
Sodium: 134 mmol/L — ABNORMAL LOW (ref 135–145)
Total Bilirubin: 0.4 mg/dL (ref <1.2)
Total Protein: 7.5 g/dL (ref 6.5–8.1)

## 2023-11-12 LAB — CBC WITH DIFFERENTIAL/PLATELET
Abs Immature Granulocytes: 0.06 10*3/uL (ref 0.00–0.07)
Basophils Absolute: 0 10*3/uL (ref 0.0–0.1)
Basophils Relative: 0 %
Eosinophils Absolute: 0.1 10*3/uL (ref 0.0–0.5)
Eosinophils Relative: 1 %
HCT: 28.1 % — ABNORMAL LOW (ref 39.0–52.0)
Hemoglobin: 8.9 g/dL — ABNORMAL LOW (ref 13.0–17.0)
Immature Granulocytes: 1 %
Lymphocytes Relative: 15 %
Lymphs Abs: 1.4 10*3/uL (ref 0.7–4.0)
MCH: 27.9 pg (ref 26.0–34.0)
MCHC: 31.7 g/dL (ref 30.0–36.0)
MCV: 88.1 fL (ref 80.0–100.0)
Monocytes Absolute: 0.9 10*3/uL (ref 0.1–1.0)
Monocytes Relative: 9 %
Neutro Abs: 7.3 10*3/uL (ref 1.7–7.7)
Neutrophils Relative %: 74 %
Platelets: 378 10*3/uL (ref 150–400)
RBC: 3.19 MIL/uL — ABNORMAL LOW (ref 4.22–5.81)
RDW: 18 % — ABNORMAL HIGH (ref 11.5–15.5)
WBC: 9.7 10*3/uL (ref 4.0–10.5)
nRBC: 0 % (ref 0.0–0.2)

## 2023-11-12 LAB — RESP PANEL BY RT-PCR (RSV, FLU A&B, COVID)  RVPGX2
Influenza A by PCR: NEGATIVE
Influenza B by PCR: NEGATIVE
Resp Syncytial Virus by PCR: NEGATIVE
SARS Coronavirus 2 by RT PCR: NEGATIVE

## 2023-11-12 LAB — TYPE AND SCREEN
ABO/RH(D): O POS
Antibody Screen: NEGATIVE

## 2023-11-12 LAB — TROPONIN I (HIGH SENSITIVITY)
Troponin I (High Sensitivity): 17 ng/L (ref <18)
Troponin I (High Sensitivity): 20 ng/L — ABNORMAL HIGH (ref <18)

## 2023-11-12 LAB — BRAIN NATRIURETIC PEPTIDE: B Natriuretic Peptide: 18 pg/mL (ref 0.0–100.0)

## 2023-11-12 LAB — D-DIMER, QUANTITATIVE: D-Dimer, Quant: 3.59 ug{FEU}/mL — ABNORMAL HIGH (ref 0.00–0.50)

## 2023-11-12 MED ORDER — IOHEXOL 350 MG/ML SOLN
75.0000 mL | Freq: Once | INTRAVENOUS | Status: AC | PRN
  Administered 2023-11-12: 75 mL via INTRAVENOUS

## 2023-11-12 NOTE — ED Notes (Signed)
ED Provider at bedside. 

## 2023-11-12 NOTE — Telephone Encounter (Signed)
Copied from CRM [id number]. Topic: Clinical - Pink Word Triage >> Nov 12, 2023 11:38 AM Almira Coaster wrote: Reason for Triage:Patient called with symptoms of difficulty breathing, Having trouble walking  Chief Complaint:   Extreme Short  of Breath and Black stool . Started last Friday.   Symptoms:Extremely fatigued Frequency:  Any time he tries to move or walk Pertinent Negatives: Patient denies Disposition: [x] ED /[] Urgent Care (no appt availability in office) / [] Appointment(In office/virtual)/ []  [facility] [facility]/ [] Home Care/ [] Refused Recommended Disposition /[] [place] [facility]/ []  Follow-up with PCP  Additional Notes: Currently in therapy Blood Pressure 98/54 then 86/50 Right leg was amputated in March.  Reason for Disposition  Black or tarry bowel movements  Difficulty breathing  Protocols used: Weakness (Generalized) and Fatigue-A-AH

## 2023-11-12 NOTE — ED Triage Notes (Signed)
Pt arrives from home with c/o shortness of breath for an unknown time. Pt also states 'I think my hemoglobin may be low because I have ben weak'. Pt endorses black stool at home.

## 2023-11-12 NOTE — ED Notes (Signed)
IV attempted on Pt, not successful. Charge notified.

## 2023-11-12 NOTE — ED Provider Triage Note (Signed)
Emergency Medicine Provider Triage Evaluation Note  VINICIO DEYETTE , a 72 y.o. male  was evaluated in triage.  Pt complains of SOBx3 days with no wheezing, fever or chills. Also states black stools, noticed on Friday. Not on BT. On iron.   Review of Systems  Positive: SOB Negative: Chest pain  Physical Exam  BP (!) 102/55 (BP Location: Right Arm)   Pulse 94   Temp 99.4 F (37.4 C) (Oral)   Resp 17   Ht 5\' 10"  (1.778 m)   Wt 96.6 kg   SpO2 99%   BMI 30.56 kg/m  Gen:   Awake, no distress   Resp:  Normal effort  MSK:   Moves extremities without difficulty   Medical Decision Making  Medically screening exam initiated at 3:12 PM.  Appropriate orders placed.  ASHLEE MERCER was informed that the remainder of the evaluation will be completed by another provider, this initial triage assessment does not replace that evaluation, and the importance of remaining in the ED until their evaluation is complete.     Pete Pelt, [place] 11/12/23 1513

## 2023-11-12 NOTE — Therapy (Signed)
OUTPATIENT PHYSICAL THERAPY LOWER EXTREMITY TREATMENT   Patient Name: [name] MRN: [id number] DOB:04/17/1951, 72 y.o., male Today's Date: 11/12/2023  END OF SESSION:  PT End of Session - 11/12/23 0812     Visit Number 24    Number of Visits 25    Date for PT Re-Evaluation 09/20/23    Authorization Type [employer] Medicare    Authorization Time Period 11/11 -11/27    Progress Note Due on Visit 25    PT Start Time 0804    PT Stop Time 0844    PT Time Calculation (min) 40 min    Activity Tolerance Patient tolerated treatment well;Patient limited by fatigue    Behavior During Therapy [facility] for tasks assessed/performed                 Past Medical History:  Diagnosis Date   Family history of metabolic acidosis with increased anion gap 03/05/2023   GERD (gastroesophageal reflux disease)    History of kidney stones    Hyperlipidemia    Hypertension    Prostate cancer [facility]) 2011   radiation june 2020   Umbilical hernia    Wears dentures    full   Wears glasses    Wears partial dentures    lower   Past Surgical History:  Procedure Laterality Date   ABDOMINAL AORTOGRAM W/LOWER EXTREMITY N/A 11/20/2022   Procedure: ABDOMINAL AORTOGRAM W/LOWER EXTREMITY;  Surgeon: Maeola Harman, MD;  Location: [facility] INVASIVE CV LAB;  Service: Cardiovascular;  Laterality: N/A;   ABDOMINAL AORTOGRAM W/LOWER EXTREMITY N/A 03/05/2023   Procedure: ABDOMINAL AORTOGRAM W/LOWER EXTREMITY;  Surgeon: Maeola Harman, MD;  Location: [facility] INVASIVE CV LAB;  Service: Cardiovascular;  Laterality: N/A;   AMPUTATION Right 03/09/2023   Procedure: AMPUTATION ABOVE KNEE;  Surgeon: Maeola Harman, MD;  Location: [facility] OR;  Service: Vascular;  Laterality: Right;   COLONOSCOPY N/A 08/25/2020   Procedure: COLONOSCOPY;  Surgeon: Corbin Ade, MD;  Location: [facility];  Service: Endoscopy;  Laterality: N/A;  9:30   COLONOSCOPY     CYSTOSCOPY WITH LITHOLAPAXY N/A 05/18/2021    Procedure: CYSTOSCOPY WITH LITHOLAPAXY;  Surgeon: Rene Paci, MD;  Location: [facility];  Service: Urology;  Laterality: N/A;  ONLY NEEDS  30 MIN   FEMORAL-TIBIAL BYPASS GRAFT Right 12/12/2022   Procedure: RIGHT COMMON FEMORAL-POSTERIOR TIBIAL ARTERY BYPASS WITH VEIN HARVESTING OF THE GREATER SAPHENOUS VEIN AND FEMORAL ENDARTERECTOMY WITH COMPOSITE GRAFT;  Surgeon: Maeola Harman, MD;  Location: [facility] OR;  Service: Vascular;  Laterality: Right;   POLYPECTOMY  08/25/2020   Procedure: POLYPECTOMY;  Surgeon: Corbin Ade, MD;  Location: [facility];  Service: Endoscopy;;   ROBOT ASSISTED LAPAROSCOPIC RADICAL PROSTATECTOMY  2011   TONSILLECTOMY  age 52   and adenoids removed   Patient Active Problem List   Diagnosis Date Noted   Anemia 10/19/2023   Abdominal pain 10/10/2023   Cough 10/10/2023   Abnormal gait 07/27/2023   Hypertension 07/10/2023   Blister of left leg 05/24/2023   Acute blood loss anemia 03/27/2023   Phantom pain after amputation of lower extremity (HCC) 03/27/2023   Adjustment disorder with mixed anxiety and depressed mood 03/15/2023   Above knee amputation of right lower extremity (HCC) 03/13/2023   CKD (chronic kidney disease) stage 3, GFR 30-59 ml/min (HCC) 03/13/2023   Cellulitis of right lower extremity 03/09/2023   Fever 03/09/2023   Non-traumatic rhabdomyolysis 03/09/2023   Bandemia 03/09/2023   Acute kidney injury  superimposed on chronic kidney disease (HCC) 03/05/2023   Hyperkalemia 03/05/2023   Normocytic anemia 03/05/2023   History of prostate cancer 03/05/2023   Essential hypertension 03/05/2023   Metabolic acidosis, increased anion gap 03/05/2023   Critical limb ischemia of right lower extremity (HCC) 12/12/2022   Malignant neoplasm of prostate (HCC) 03/18/2019    PCP: Rica Records FNP  REFERRING PROVIDER: Genice Rouge, MD  REFERRING DIAG: [id number] (ICD-10-CM) - S/P AKA (above knee amputation), right  (HCC)  THERAPY DIAG:  Other abnormalities of gait and mobility  Muscle weakness (generalized)  S/P AKA (above knee amputation), right (HCC)  Rationale for Evaluation and Treatment: Rehabilitation  ONSET DATE: 03/09/23  SUBJECTIVE:   SUBJECTIVE STATEMENT:  Today: Patient states as if his "hemoglobin is low" today. No pain; 98/54 BP @ rest after subjective report of fatigue   Progress Note(10/24/23): Patient has been going to [facility]; had first blood transfusion 10/17. Patient recently went to [facility] for blood work and has blood transfusion scheduled tomorrow 10/25/23. Patient also had [facility] appointment recently 10/20 for prosthetic adjustment and has another appointment with [facility]  ~Nov 8th. Since lats progress note, patient has been doing well around the home; states some exercises are harder to perform due to fatigue. NPRS levels have been 7/10 up to 10/10 due to phantom limb pain which lasts 5-69min at a time. Patient would like to continue to improve strength.       Progress Note 09/10/23: Patient has recently been to [facility] to have prosthetic was adjusted; this lasted 2-3 days until the leg began to rotate inwards again. Patient reports still have issues with general mobility and phantom limb pain. The phantom limb pain can get very sharp/intense. Patient has PCP follow up soon.    Eval: Patient states he did a lot of therapy at the hospital following amputation. He has been using RW to get around. He got his prosthetic July 19th. Patient is frustrated with his fatigue, gait speed, dressing difficulties. Is mostly just walking around in the house at this point. Has to take breaks with walking in the house due to fatiuge. He enjoys baking cakes. Has to encourage himself to use leg more. Has not used leg 8 hours yet. Had some wounds prior to amputation. Has some phantom pain.   PERTINENT HISTORY: R AKA 03/09/23, admitted [facility] 03/13/23-03/26/23, HTN, HLD, hx prostate cancer PAIN:   Are you having pain? Yes: NPRS scale: 7/10 Pain location: R quad region Pain description: sharp Aggravating factors: constant Relieving factors: movement, meds  PRECAUTIONS: Fall  WEIGHT BEARING RESTRICTIONS: No  FALLS:  Has patient fallen in last 6 months? No  Patient Survey  Lower Extremity Functional Scale (LEFS)  14 / 80 = 17.5 % on 09/10/23  (10/24/23) Lower Extremity Functional Score: 31 / 80 = 38.8 %     OCCUPATION: Out on leave  PLOF: Independent  PATIENT GOALS: to be walking better like he isn't using a prosthetic     OBJECTIVE: (objective measures from initial evaluation unless otherwise dated)   COGNITION: Overall cognitive status: Within functional limits for tasks assessed     SENSATION: WFL   POSTURE: No Significant postural limitations  PALPATION: No significant limitations  LOWER EXTREMITY ROM: slightly decreased R hip extension ROM    LOWER EXTREMITY MMT:  MMT Right eval Left eval Right  9/16 Left 9/16 Right  10/24/23  Hip flexion 4 (limited range in seated) 4+ 4 4+ 4  Hip extension 3- 3+  3 3+ 3+  Hip abduction 3- 4- 3 4- 3  Hip adduction       Hip internal rotation       Hip external rotation       Knee flexion  5  5   Knee extension  5  5   Ankle dorsiflexion  5  5   Ankle plantarflexion       Ankle inversion       Ankle eversion        (Blank rows = not tested) *= pain/symptoms    FUNCTIONAL TESTS:  5 times sit to stand: 53.94 seconds with UE support on chair and RW, relies LLE Timed up and go (TUG): 2: 18 with RW (includes time to release air from prosthetic upon standing) 2 minute walk test: 24 feet with RW  09/09/13  5 times sit to stand: 35s seconds with UE support on chair and RW, Timed up and go (TUG): 36m22s with rolling walker; includes time to let air out of prosthetic 2 minute walk test:  43 feet with rolling walker  Progress note #2 10/24/23   : 63 feet with rolling walker, stand-by assist   5TSTS:  30.25s: moderate UE use; partial full sit to stand(10/29/23)  TUG: 20m17secs (10/29/23)  GAIT: Distance walked: 43 feet Assistive device utilized: Walker - 2 wheeled Level of assistance: SBA Comments: , step to pattern  *08/16/23: Patient ambulates using a step to gait pattern in rolling walker. Minimal increase in 1) lateral heel whip and 2) circumduction on right lower extremity during swing phase  10/29/23: Patient ambulates with rolling walker mimicking a 2-point step through pattern; moderate UE assistance on rolling walker; minimal internal rotation of residual limb during Swing Phase    TODAY'S TREATMENT:                                                                                                                              DATE:  11/12/23 Seated   Hip marches with 2# xx10  Hip abduction with B theraband 2x10  Ball squeezes  Hip abduction with B theraband 2x10 Standing in // bars  Hip abduction x10 each    11/07/23 Standing in // bars   Hip abduction, flexion with 2# 2x5 reps, bilateral   S2S's + elevated chair   Tandem holds + 2" step, 3-5" contact guard assist Seated theraband Rows, black 2x10    11/05/23 Seated  Hip marches with 2#  Standing in // bars   Hip abduction with 2# 2x5 reps, bilateral   Wt shifting in tandem, contact guard assist   Gait Training   Gait around clinic + rolling walker, stand-by assist   11/01/23 Standing in // bars  Tandem balance on 4" step, 5-10" holds   Weighted rolling walker ambulation in clinic, stand-by assist   S2S from elevated surface    10/29/23 Objective tests & measures  5TSTS  TUG  Seated hip marches  10/24/23 PT Progress Note- see above   PATIENT EDUCATION:  Education details: Patient educated on exam findings, POC, scope of PT, HEP, and prone lying to promote hip extension, skin checks. Person educated: Patient Education method: Explanation, Demonstration, and Handouts Education comprehension:  verbalized understanding, returned demonstration, verbal cues required, and tactile cues required  HOME EXERCISE PROGRAM: Access Code: [email or web address] URL: [email or web address]  Date: 08/09/2023 - Prone on Elbows Stretch  - 3 x daily - 7 x weekly - 10-20 minutes hold - Standing March with Counter Support  - 3 x daily - 7 x weekly - 2 sets - 10 reps - Standing Hip Abduction with Counter Support  - 3 x daily - 7 x weekly - 2 sets - 10 reps - Standing Hip Extension with Counter Support  - 3 x daily - 7 x weekly - 2 sets - 10 reps  ASSESSMENT:  CLINICAL IMPRESSION:  Today: As per subjective, when patient arrived to clinic, he states he was feeling increased fatigue at home today; patient with noted increased pallor in the face. PT took seated BP before PT session began; patient at 98/54. Patient states that his BP runs lower sometimes. PT progressed with session @ a low RPE today with intermittent seated breaks. BP checked mid session to be 97/47 then at end of session 86/50. PT used wheelchair to transport patient to vehicle outside to his driver to help reduce physical exertion. PT recommends patient to f/u with [facility] or Urgent care after session today.  Patient will benefit from skilled physical therapy in order to improve function and reduce impairment.    Progress note 10/24/23: Patient a 72 y.o. y.o. male who was seen today for physical therapy progress note for s/p R AKA. Patient has shown improvements in overall distance(up to 63 feet from 43), LEFs Score(up to ~38%) and lower extremity strength for MMT.  Patient still presents with deficits in LE strength, pain levels endurance, activity tolerance, gait, balance, and functional mobility with ADL. Patient is having to modify and restrict ADL as indicated by outcome measure score as well as subjective information and objective measures which is affecting overall participation.    09/10/23: Patient a 72 y.o. y.o. male who was seen  today for physical therapy progress note for s/p R AKA. Patient has shown improvements in overall TUG speed, distance, and 5TSTS measures by ~50%. Patient still presents with deficits in LE strength, ROM, endurance, activity tolerance, gait, balance, and functional mobility with ADL. Patient is having to modify and restrict ADL as indicated by outcome measure score as well as subjective information and objective measures which is affecting overall participation. Patient will benefit from skilled physical therapy in order to improve function and reduce impairment.  OBJECTIVE IMPAIRMENTS: Abnormal gait, decreased activity tolerance, decreased balance, decreased mobility, difficulty walking, decreased ROM, decreased strength, increased muscle spasms, impaired flexibility, and improper body mechanics.   ACTIVITY LIMITATIONS: carrying, lifting, bending, standing, squatting, stairs, transfers, bathing, locomotion level, and caring for others  PARTICIPATION LIMITATIONS: meal prep, cleaning, laundry, shopping, community activity, and yard work  PERSONAL FACTORS: Fitness and 3+ comorbidities: R AKA, HTN, HLD  are also affecting patient's functional outcome.   REHAB POTENTIAL: Good  CLINICAL DECISION MAKING: Stable/uncomplicated  EVALUATION COMPLEXITY: Low   GOALS: Goals reviewed with patient? Yes  SHORT TERM GOALS: Target date: 08/30/2023    Patient will be independent with HEP in order to improve functional outcomes. Baseline:  Goal status: goal met   2.  Patient will report at least 25% improvement in symptoms for improved quality of life. Baseline:  Goal status: goal met     LONG TERM GOALS: Target date: 09/20/2023 --> 11/21/23(updated 10/24/23)    Patient will report at least 75% improvement in symptoms for improved quality of life. Baseline:  Goal status: goal met   2.   Patient will demonstrate grade of 5/5 MMT grade in all tested musculature as evidence of improved strength to  assist with stair ambulation and gait.   Baseline: see above Goal status: in progress 10/24/23  3.  Patient will be able to ambulate at least 100 feet in  with LRAD in order to demonstrate improved gait speed for community ambulation. Baseline: 24 feet with RW Goal status:  in progress 10/24/23  4.  Patient will be able to complete TUG in under 24.3 seconds in order to demonstrate improved mobility to reduce risk for falls. Baseline: 2:18 with RW (includes time to release air from prosthetic upon standing) Goal status:  in progress 10/24/23  5.  Patient will be able to complete 5x STS in under 20 seconds in order to demo improved functional strength. Baseline: 53.94 seconds with UE support on chair and RW, relies LLE Goal status: in progress 10/24/23     PLAN:  PT FREQUENCY: 1-2x/week  PT DURATION: 4 weeks  PLANNED INTERVENTIONS: Balance training, Patient/Family education, Joint manipulation, Joint mobilization, Stair training, Orthotic/Fit training, DME instructions, Aquatic Therapy, Dry Needling, Electrical stimulation, Spinal manipulation, Spinal mobilization, Cryotherapy, Moist heat, Compression bandaging, scar mobilization, Splintting, Taping, Traction, Ultrasound, Ionotophoresis 4mg /ml Dexamethasone, and  97110-Therapeutic exercises, 97530- Therapeutic activity, [id number]- Neuromuscular re-education, 97535- Self Care, 16109- Manual therapy, [id number]- Gait training   PLAN FOR NEXT SESSION: hip mobility and hip strength, functional strength, balance and gait training, endurance   Seymour Bars PT, DPT

## 2023-11-12 NOTE — ED Provider Notes (Signed)
[name] EMERGENCY DEPARTMENT AT [facility] Provider Note   CSN: [id number] Arrival date & time: 11/12/23  1350     History {Add pertinent medical, surgical, social history, OB history to HPI:1} Chief Complaint  Patient presents with   Shortness of Breath    [name] is a 72 y.o. male.   Shortness of Breath    Patient has a history of hypertension acid reflux anemia hyperlipidemia prostate cancer.  Patient presented to ED with complaints of shortness of breath.  Patient states symptoms ongoing for the last few days.  Patient has had trouble with anemia in the past.  He was concerned this could be recurrent issue for him.  He has not had any fevers chills.  No wheezing.  He denies any blood in his stool but does have dark stools.  He is on iron.  Home Medications Prior to Admission medications   Medication Sig Start Date End Date Taking? Authorizing Provider  acetaminophen (TYLENOL) 325 MG tablet Take 1-2 tablets (325-650 mg total) by mouth every 4 (four) hours as needed for mild pain. 03/26/23   Love, Evlyn Kanner, PA-C  albuterol (VENTOLIN HFA) 108 (90 Base) MCG/ACT inhaler Inhale 2 puffs into the lungs every 6 (six) hours as needed for wheezing or shortness of breath. 10/19/23   Del Nigel Berthold, FNP  aspirin EC 81 MG tablet Take 81 mg by mouth daily.    [provider]  atorvastatin (LIPITOR) 40 MG tablet Take 1 tablet (40 mg total) by mouth every morning. 04/05/23   Maeola Harman, MD  benzonatate (TESSALON) 200 MG capsule Take 1 capsule (200 mg total) by mouth 2 (two) times daily as needed for cough. 10/10/23   Del Nigel Berthold, FNP  cyclobenzaprine (FLEXERIL) 5 MG tablet Take 1 tablet (5 mg total) by mouth 3 (three) times daily as needed for muscle spasms. 04/25/23   Lovorn, Aundra Millet, MD  gabapentin (NEURONTIN) 100 MG capsule Take 2 capsules (200 mg total) by mouth 2 (two) times daily. Along with 300 mg nightly- for phantom pain-  due to R AKA 07/27/23   Lovorn, Aundra Millet, MD  gabapentin (NEURONTIN) 100 MG capsule Take 1 capsule by mouth once daily 10/30/23   Lovorn, Aundra Millet, MD  gabapentin (NEURONTIN) 300 MG capsule Take 1 capsule by mouth at bedtime 10/30/23   Lovorn, Aundra Millet, MD  iron polysaccharides (NIFEREX) 150 MG capsule Take 1 capsule (150 mg total) by mouth daily. 10/19/23   Del Nigel Berthold, FNP  levocetirizine (XYZAL) 5 MG tablet Take 1 tablet (5 mg total) by mouth every evening. 05/24/23   Del Nigel Berthold, FNP  lisinopril-hydrochlorothiazide (ZESTORETIC) 10-12.5 MG tablet Take 1 tablet by mouth daily. 10/10/23   Del Nigel Berthold, FNP  metoprolol tartrate (LOPRESSOR) 25 MG tablet Take 0.5 tablets (12.5 mg total) by mouth 2 (two) times daily. Will give 1 month supply- and get from NP/PCP in future. 10/10/23   Del Nigel Berthold, FNP  Multiple Vitamins-Minerals (CENTRUM SILVER PO) Take 1 tablet by mouth daily.     [provider]  mupirocin ointment (BACTROBAN) 2 % Apply 1 Application topically 2 (two) times daily. 05/24/23   Del Nigel Berthold, FNP  oxybutynin (DITROPAN-XL) 10 MG 24 hr tablet Take 10 mg by mouth daily. 04/12/23   [provider]  Oxycodone HCl 10 MG TABS Take 1 tablet (10 mg total) by mouth 3 (three) times daily as needed. 03/26/23   Love, Evlyn Kanner,  PA-C  pantoprazole (PROTONIX) 40 MG tablet Take 1 tablet (40 mg total) by mouth daily. 03/26/23   Love, Evlyn Kanner, PA-C  polyethylene glycol powder (MIRALAX) 17 GM/SCOOP powder Take 17 g by mouth daily as needed for mild constipation. 10/10/23   Del Nigel Berthold, FNP  senna-docusate (SENOKOT-S) 8.6-50 MG tablet Take 2 tablets by mouth daily after supper. 03/26/23   Love, Evlyn Kanner, PA-C  sodium bicarbonate 650 MG tablet Take 1 tablet (650 mg total) by mouth 2 (two) times daily. 03/26/23   Love, Evlyn Kanner, PA-C      Allergies    Shellfish allergy    Review of Systems   Review of Systems  Respiratory:  Positive for  shortness of breath.     Physical Exam Updated Vital Signs BP 114/64   Pulse 98   Temp 98.5 F (36.9 C) (Oral)   Resp 18   Ht 1.778 m (5\' 10" )   Wt 96.6 kg   SpO2 93%   BMI 30.56 kg/m  Physical Exam Vitals and nursing note reviewed.  Constitutional:      General: He is not in acute distress.    Appearance: He is well-developed.  HENT:     Head: Normocephalic and atraumatic.     Right Ear: External ear normal.     Left Ear: External ear normal.  Eyes:     General: No scleral icterus.       Right eye: No discharge.        Left eye: No discharge.     Conjunctiva/sclera: Conjunctivae normal.  Neck:     Trachea: No tracheal deviation.  Cardiovascular:     Rate and Rhythm: Normal rate and regular rhythm.  Pulmonary:     Effort: Pulmonary effort is normal. No respiratory distress.     Breath sounds: Normal breath sounds. No stridor. No wheezing or rales.  Abdominal:     General: Bowel sounds are normal. There is no distension.     Palpations: Abdomen is soft.     Tenderness: There is no abdominal tenderness. There is no guarding or rebound.  Musculoskeletal:        General: No swelling, tenderness or deformity.     Cervical back: Neck supple.     Comments: Status post right lower extremity amputation, edema noted in the left lower extremity  Skin:    General: Skin is warm and dry.     Findings: No rash.  Neurological:     General: No focal deficit present.     Mental Status: He is alert. Mental status is at baseline.     Cranial Nerves: No cranial nerve deficit, dysarthria or facial asymmetry.     Sensory: No sensory deficit.     Motor: No abnormal muscle tone or seizure activity.     Coordination: Coordination normal.  Psychiatric:        Mood and Affect: Mood normal.     ED Results / Procedures / Treatments   Labs (all labs ordered are listed, but only abnormal results are displayed) Labs Reviewed  COMPREHENSIVE METABOLIC PANEL - Abnormal; Notable for the  following components:      Result Value   Sodium 134 (*)    Chloride 97 (*)    Glucose, Bld 108 (*)    BUN 37 (*)    Creatinine, Ser 1.60 (*)    Albumin 2.5 (*)    GFR, Estimated 45 (*)    All other components within normal limits  CBC WITH DIFFERENTIAL/PLATELET - Abnormal; Notable for the following components:   RBC 3.19 (*)    Hemoglobin 8.9 (*)    HCT 28.1 (*)    RDW 18.0 (*)    All other components within normal limits  D-DIMER, QUANTITATIVE - Abnormal; Notable for the following components:   D-Dimer, Quant 3.59 (*)    All other components within normal limits  TROPONIN I (HIGH SENSITIVITY) - Abnormal; Notable for the following components:   Troponin I (High Sensitivity) 20 (*)    All other components within normal limits  RESP PANEL BY RT-PCR (RSV, FLU A&B, COVID)  RVPGX2  BRAIN NATRIURETIC PEPTIDE  OCCULT BLOOD X 1 CARD TO LAB, STOOL  TYPE AND SCREEN  TROPONIN I (HIGH SENSITIVITY)    EKG EKG Interpretation Date/Time:  Monday November 12 2023 15:19:57 EST Ventricular Rate:  95 PR Interval:  146 QRS Duration:  100 QT Interval:  368 QTC Calculation: 462 R Axis:   -57  Text Interpretation: Normal sinus rhythm Left axis deviation Nonspecific ST abnormality Abnormal QRS-T angle, consider primary T wave abnormality Abnormal ECG When compared with ECG of 11-Oct-2023 09:48, No significant change was found Confirmed by Linwood Dibbles [phone]) on 11/12/2023 3:24:39 PM  Radiology DG Chest 2 View  Result Date: 11/12/2023 CLINICAL DATA:  Shortness of breath EXAM: CHEST - 2 VIEW COMPARISON:  10/11/2023 FINDINGS: The heart size and mediastinal contours are within normal limits. Aortic atherosclerosis. Minimal linear left basilar scarring or atelectasis. No focal airspace consolidation, pleural effusion, or pneumothorax. The visualized skeletal structures are unremarkable. IMPRESSION: No active cardiopulmonary disease. Electronically Signed   By: Duanne Guess D.O.   On: 11/12/2023  18:05    Procedures Procedures  {Document cardiac monitor, telemetry assessment procedure when appropriate:1}  Medications Ordered in ED Medications  iohexol (OMNIPAQUE) 350 MG/ML injection 75 mL (75 mLs Intravenous Contrast Given 11/12/23 2247)    ED Course/ Medical Decision Making/ A&P Clinical Course as of 11/12/23 2330  Mon Nov 12, 2023  2119 Cxr, no pna [JK]  2119 Comprehensive metabolic panel(!) No change [JK]  2119 CBC with Differential(!) Hgb increased [JK]  2330 Serial troponins slightly elevated at 17 and 20 but doubt consistent with ACS [JK]    Clinical Course User Index [JK] Linwood Dibbles, MD   {   Click here for ABCD2, HEART and other calculatorsREFRESH Note before signing :1}                              Medical Decision Making Amount and/or Complexity of Data Reviewed Labs: ordered. Decision-making details documented in ED Course. Radiology: ordered.  Risk Prescription drug management.   ***  {Document critical care time when appropriate:1} {Document review of labs and clinical decision tools ie heart score, Chads2Vasc2 etc:1}  {Document your independent review of radiology images, and any outside records:1} {Document your discussion with family members, caretakers, and with consultants:1} {Document social determinants of health affecting pt's care:1} {Document your decision making why or why not admission, treatments were needed:1} Final Clinical Impression(s) / ED Diagnoses Final diagnoses:  None    Rx / DC Orders ED Discharge Orders     None

## 2023-11-13 NOTE — Telephone Encounter (Signed)
I strongly advise patient to visit ED

## 2023-11-13 NOTE — ED Notes (Signed)
Pt o2 dropped to 83% while sleeping. Pt placed on 2L [place]

## 2023-11-13 NOTE — ED Provider Notes (Signed)
Patient signed out pending CT scan.  CT scan does not show any evidence of PE.  There is some concern however for mediastinal supraclavicular and axillary adenopathy as well as upper abdominal metastasis.  He also has multiple osseous lesions concerning for metastatic disease.  Patient states that he is not currently being treated for cancer but does have a history of prostate cancer.  He follows with an oncologist.  I will message the oncologist.  Advised the patient and his wife of these findings.  He remained hemodynamically stable and off of oxygen.  He states that he feels comfortable going home.Marland Kitchen Physical Exam  BP (!) 106/56   Pulse (!) 104   Temp 98.5 F (36.9 C) (Oral)   Resp 18   Ht 1.778 m (5\' 10" )   Wt 96.6 kg   SpO2 95%   BMI 30.56 kg/m   Physical Exam Weight, alert, no acute distress Procedures  Procedures  ED Course / MDM   Clinical Course as of 11/13/23 0225  Mon Nov 12, 2023  2119 Cxr, no pna [JK]  2119 Comprehensive metabolic panel(!) No change [JK]  2119 CBC with Differential(!) Hgb increased [JK]  2330 Serial troponins slightly elevated at 17 and 20 but doubt consistent with ACS [JK]    Clinical Course User Index [JK] Linwood Dibbles, MD   Medical Decision Making Amount and/or Complexity of Data Reviewed Labs: ordered. Decision-making details documented in ED Course. Radiology: ordered.  Risk Prescription drug management.   Problem List Items Addressed This Visit   None Visit Diagnoses     Shortness of breath    -  Primary   Metastatic malignant neoplasm, unspecified site [facility])                 Navjot Pilgrim, Mayer Masker, MD 11/13/23 0225

## 2023-11-13 NOTE — Discharge Instructions (Signed)
You were seen today for shortness of breath.  This is likely multifactorial.  However your CT scan does have some concerning findings that you may have recurrent or new cancer.  You need to follow-up closely with your oncologist.

## 2023-11-14 ENCOUNTER — Ambulatory Visit (HOSPITAL_COMMUNITY): Payer: Medicare Other

## 2023-11-14 ENCOUNTER — Telehealth: Payer: Self-pay | Admitting: *Deleted

## 2023-11-14 NOTE — Telephone Encounter (Signed)
Patient was in the ER on 11/18 for SOB and was advised to follow up with our office.  He has follow up on 12/12 with Dr. Ellin Saba following CT scan and BMBX.  Patient stated that he has been having black tarry stools recently.  I will provide with stool cards tomorrow and have him return to [place] for testing, as it does not appear that this has been assessed.  Hgb was stable in the ER.

## 2023-11-14 NOTE — Telephone Encounter (Signed)
Patient went to ER>

## 2023-11-15 ENCOUNTER — Other Ambulatory Visit: Payer: Self-pay

## 2023-11-15 ENCOUNTER — Ambulatory Visit (HOSPITAL_COMMUNITY): Payer: Medicare Other

## 2023-11-15 DIAGNOSIS — I70221 Atherosclerosis of native arteries of extremities with rest pain, right leg: Secondary | ICD-10-CM

## 2023-11-15 DIAGNOSIS — Z89611 Acquired absence of right leg above knee: Secondary | ICD-10-CM | POA: Diagnosis not present

## 2023-11-15 DIAGNOSIS — R2689 Other abnormalities of gait and mobility: Secondary | ICD-10-CM

## 2023-11-15 DIAGNOSIS — M6281 Muscle weakness (generalized): Secondary | ICD-10-CM

## 2023-11-15 NOTE — Therapy (Addendum)
OUTPATIENT PHYSICAL THERAPY LOWER EXTREMITY TREATMENT    PHYSICAL THERAPY DISCHARGE SUMMARY  Visits from Start of Care: 25  Current functional level related to goals / functional outcomes: See below   Remaining deficits: See below   Education / Equipment: See below   Patient agrees to discharge. Patient goals were partially met. Patient is being discharged due to lack of progress.    Patient Name: [name] MRN: [id number] DOB:[birth year], 72 y.o., male Today's Date: 11/15/2023  END OF SESSION:  PT End of Session - 11/15/23 0838     Visit Number 25    Number of Visits 25    Date for PT Re-Evaluation 09/20/23    Authorization Type [employer] Medicare    Authorization Time Period 11/11 -11/27    Progress Note Due on Visit 25    PT Start Time 0840    PT Stop Time 0920    PT Time Calculation (min) 40 min    Activity Tolerance Patient tolerated treatment well;Patient limited by fatigue    Behavior During Therapy [facility] for tasks assessed/performed                 Past Medical History:  Diagnosis Date   Family history of metabolic acidosis with increased anion gap 03/05/2023   GERD (gastroesophageal reflux disease)    History of kidney stones    Hyperlipidemia    Hypertension    Prostate cancer [facility]) 2011   radiation june 2020   Umbilical hernia    Wears dentures    full   Wears glasses    Wears partial dentures    lower   Past Surgical History:  Procedure Laterality Date   ABDOMINAL AORTOGRAM W/LOWER EXTREMITY N/A 11/20/2022   Procedure: ABDOMINAL AORTOGRAM W/LOWER EXTREMITY;  Surgeon: Maeola Harman, MD;  Location: [facility] INVASIVE CV LAB;  Service: Cardiovascular;  Laterality: N/A;   ABDOMINAL AORTOGRAM W/LOWER EXTREMITY N/A 03/05/2023   Procedure: ABDOMINAL AORTOGRAM W/LOWER EXTREMITY;  Surgeon: Maeola Harman, MD;  Location: [facility] INVASIVE CV LAB;  Service: Cardiovascular;  Laterality: N/A;   AMPUTATION Right 03/09/2023   Procedure:  AMPUTATION ABOVE KNEE;  Surgeon: Maeola Harman, MD;  Location: [facility] OR;  Service: Vascular;  Laterality: Right;   COLONOSCOPY N/A 08/25/2020   Procedure: COLONOSCOPY;  Surgeon: Corbin Ade, MD;  Location: [facility];  Service: Endoscopy;  Laterality: N/A;  9:30   COLONOSCOPY     CYSTOSCOPY WITH LITHOLAPAXY N/A 05/18/2021   Procedure: CYSTOSCOPY WITH LITHOLAPAXY;  Surgeon: Rene Paci, MD;  Location: [facility];  Service: Urology;  Laterality: N/A;  ONLY NEEDS  30 MIN   FEMORAL-TIBIAL BYPASS GRAFT Right 12/12/2022   Procedure: RIGHT COMMON FEMORAL-POSTERIOR TIBIAL ARTERY BYPASS WITH VEIN HARVESTING OF THE GREATER SAPHENOUS VEIN AND FEMORAL ENDARTERECTOMY WITH COMPOSITE GRAFT;  Surgeon: Maeola Harman, MD;  Location: [facility] OR;  Service: Vascular;  Laterality: Right;   POLYPECTOMY  08/25/2020   Procedure: POLYPECTOMY;  Surgeon: Corbin Ade, MD;  Location: [facility];  Service: Endoscopy;;   ROBOT ASSISTED LAPAROSCOPIC RADICAL PROSTATECTOMY  2011   TONSILLECTOMY  age 71   and adenoids removed   Patient Active Problem List   Diagnosis Date Noted   Anemia 10/19/2023   Abdominal pain 10/10/2023   Cough 10/10/2023   Abnormal gait 07/27/2023   Hypertension 07/10/2023   Blister of left leg 05/24/2023   Acute blood loss anemia 03/27/2023   Phantom pain after amputation of lower extremity (HCC) 03/27/2023  Adjustment disorder with mixed anxiety and depressed mood 03/15/2023   Above knee amputation of right lower extremity (HCC) 03/13/2023   CKD (chronic kidney disease) stage 3, GFR 30-59 ml/min (HCC) 03/13/2023   Cellulitis of right lower extremity 03/09/2023   Fever 03/09/2023   Non-traumatic rhabdomyolysis 03/09/2023   Bandemia 03/09/2023   Acute kidney injury superimposed on chronic kidney disease (HCC) 03/05/2023   Hyperkalemia 03/05/2023   Normocytic anemia 03/05/2023   History of prostate cancer 03/05/2023   Essential hypertension  03/05/2023   Metabolic acidosis, increased anion gap 03/05/2023   Critical limb ischemia of right lower extremity (HCC) 12/12/2022   Malignant neoplasm of prostate (HCC) 03/18/2019    PCP: Konrad Dolores Polanco FNP  REFERRING PROVIDER: Genice Rouge, MD  REFERRING DIAG: [phone] (ICD-10-CM) - S/P AKA (above knee amputation), right (HCC)  THERAPY DIAG:  Other abnormalities of gait and mobility  Muscle weakness (generalized)  S/P AKA (above knee amputation), right (HCC)  Rationale for Evaluation and Treatment: Rehabilitation  ONSET DATE: 03/09/23  SUBJECTIVE:   SUBJECTIVE STATEMENT:  Today: Patient states going to ED on Monday afternoon after feeling increased fatigue. MD to find (+) findings in CT Scan; see below. Patient reports his residual limb shrunk even more causing him to walk to left knee in flexion    IMPRESSION:CT Scan 11/18 1. No CT evidence of pulmonary artery embolus. 2. Mediastinal, right hilar, right supraclavicular, and left axillary adenopathy. 3. Upper abdominal metastasis involving the adrenal glands. 4. Multiple osseous and chest wall metastatic disease. Associated pathologic fracture of T10 with high-grade focal narrowing of the central canal. MRI may provide better evaluation. 5.  Aortic Atherosclerosis (ICD10-I70.0).    Progress Note(10/24/23): Patient has been going to Hematology MD; had first blood transfusion 10/17. Patient recently went to [facility] for blood work and has blood transfusion scheduled tomorrow 10/25/23. Patient also had [facility] appointment recently 10/20 for prosthetic adjustment and has another appointment with [facility]  ~Nov 8th. Since lats progress note, patient has been doing well around the home; states some exercises are harder to perform due to fatigue. NPRS levels have been 7/10 up to 10/10 due to phantom limb pain which lasts 5-68min at a time. Patient would like to continue to improve strength.       Progress Note 09/10/23:  Patient has recently been to [facility] to have prosthetic was adjusted; this lasted 2-3 days until the leg began to rotate inwards again. Patient reports still have issues with general mobility and phantom limb pain. The phantom limb pain can get very sharp/intense. Patient has PCP follow up soon.    Eval: Patient states he did a lot of therapy at the hospital following amputation. He has been using RW to get around. He got his prosthetic July 19th. Patient is frustrated with his fatigue, gait speed, dressing difficulties. Is mostly just walking around in the house at this point. Has to take breaks with walking in the house due to fatiuge. He enjoys baking cakes. Has to encourage himself to use leg more. Has not used leg 8 hours yet. Had some wounds prior to amputation. Has some phantom pain.   PERTINENT HISTORY: R AKA 03/09/23, admitted CIR 03/13/23-03/26/23, HTN, HLD, hx prostate cancer PAIN:  Are you having pain? Yes: NPRS scale: 7/10 Pain location: R quad region Pain description: sharp Aggravating factors: constant Relieving factors: movement, meds  PRECAUTIONS: Fall  WEIGHT BEARING RESTRICTIONS: No  FALLS:  Has patient fallen in last 6 months? No  Patient  Survey  Lower Extremity Functional Scale (LEFS)  14 / 80 = 17.5 % on 09/10/23  (10/24/23) Lower Extremity Functional Score: 31 / 80 = 38.8 %     OCCUPATION: Out on leave  PLOF: Independent  PATIENT GOALS: to be walking better like he isn't using a prosthetic     OBJECTIVE: (objective measures from initial evaluation unless otherwise dated)   COGNITION: Overall cognitive status: Within functional limits for tasks assessed     SENSATION: WFL   POSTURE: No Significant postural limitations  PALPATION: No significant limitations  LOWER EXTREMITY ROM: slightly decreased R hip extension ROM    LOWER EXTREMITY MMT:  MMT Right eval Left eval Right  9/16 Left 9/16 Right  10/24/23  Hip flexion 4 (limited  range in seated) 4+ 4 4+ 4  Hip extension 3- 3+ 3 3+ 3+  Hip abduction 3- 4- 3 4- 3  Hip adduction       Hip internal rotation       Hip external rotation       Knee flexion  5  5   Knee extension  5  5   Ankle dorsiflexion  5  5   Ankle plantarflexion       Ankle inversion       Ankle eversion        (Blank rows = not tested) *= pain/symptoms    FUNCTIONAL TESTS:  5 times sit to stand: 53.94 seconds with UE support on chair and RW, relies LLE Timed up and go (TUG): 2: 18 with RW (includes time to release air from prosthetic upon standing) 2 minute walk test: 24 feet with RW  09/09/13  5 times sit to stand: 35s seconds with UE support on chair and RW, Timed up and go (TUG): 31m22s with rolling walker; includes time to let air out of prosthetic 2 minute walk test:  43 feet with rolling walker  Progress note #2 10/24/23   : 63 feet with rolling walker, stand-by assist   5TSTS: 30.25s: moderate UE use; partial full sit to stand(10/29/23)  TUG: 40m17secs (10/29/23)  GAIT: Distance walked: 43 feet Assistive device utilized: Walker - 2 wheeled Level of assistance: SBA Comments: , step to pattern  *08/16/23: Patient ambulates using a step to gait pattern in rolling walker. Minimal increase in 1) lateral heel whip and 2) circumduction on right lower extremity during swing phase  10/29/23: Patient ambulates with rolling walker mimicking a 2-point step through pattern; moderate UE assistance on rolling walker; minimal internal rotation of residual limb during Swing Phase    TODAY'S TREATMENT:                                                                                                                              DATE:   11/15/23 Seated   Hip marches with 2# 2x10  Ball squeezes 20x5"H  Seated forward stretch using XL physioball 10x5" H   Rows in chair  vs BLK theraband 2x10  Standing in // bars  Hip abduction x5 each   11/12/23 Seated   Hip marches with 2# xx10  Hip  abduction with B theraband 2x10  Ball squeezes  Hip abduction with B theraband 2x10 Standing in // bars  Hip abduction x10 each    11/07/23 Standing in // bars   Hip abduction, flexion with 2# 2x5 reps, bilateral   S2S's + elevated chair   Tandem holds + 2" step, 3-5" contact guard assist Seated theraband Rows, black 2x10    11/05/23 Seated  Hip marches with 2#  Standing in // bars   Hip abduction with 2# 2x5 reps, bilateral   Wt shifting in tandem, contact guard assist   Gait Training   Gait around clinic + rolling walker, stand-by assist    PATIENT EDUCATION:  Education details: Patient educated on exam findings, POC, scope of PT, HEP, and prone lying to promote hip extension, skin checks. Person educated: Patient Education method: Explanation, Demonstration, and Handouts Education comprehension: verbalized understanding, returned demonstration, verbal cues required, and tactile cues required  HOME EXERCISE PROGRAM: Access Code: [email or web address] URL: [email or web address]  Date: 08/09/2023 - Prone on Elbows Stretch  - 3 x daily - 7 x weekly - 10-20 minutes hold - Standing March with Counter Support  - 3 x daily - 7 x weekly - 2 sets - 10 reps - Standing Hip Abduction with Counter Support  - 3 x daily - 7 x weekly - 2 sets - 10 reps - Standing Hip Extension with Counter Support  - 3 x daily - 7 x weekly - 2 sets - 10 reps  ASSESSMENT:  CLINICAL IMPRESSION:  Today: As per subjective, pt's recent CT scan to find + metastasis to adrenal glands, will f/u with MD; can be a factor to patient's increased fatigue for the last few PT sessions. PT progressed with gentle lower extremity strengthening maintaining a low RPE due to Mod fatigue by patient; intermittent seated rest breaks taken. Pt tolerated therapeutic exercise  with no increase in pain levels.    Patient will benefit from skilled physical therapy in order to improve function and reduce  impairment.    Progress note 10/24/23: Patient a 72 y.o. y.o. male who was seen today for physical therapy progress note for s/p R AKA. Patient has shown improvements in overall distance(up to 63 feet from 43), LEFs Score(up to ~38%) and lower extremity strength for MMT.  Patient still presents with deficits in LE strength, pain levels endurance, activity tolerance, gait, balance, and functional mobility with ADL. Patient is having to modify and restrict ADL as indicated by outcome measure score as well as subjective information and objective measures which is affecting overall participation.    09/10/23: Patient a 72 y.o. y.o. male who was seen today for physical therapy progress note for s/p R AKA. Patient has shown improvements in overall TUG speed, distance, and 5TSTS measures by ~50%. Patient still presents with deficits in LE strength, ROM, endurance, activity tolerance, gait, balance, and functional mobility with ADL. Patient is having to modify and restrict ADL as indicated by outcome measure score as well as subjective information and objective measures which is affecting overall participation. Patient will benefit from skilled physical therapy in order to improve function and reduce impairment.  OBJECTIVE IMPAIRMENTS: Abnormal gait, decreased activity tolerance, decreased balance, decreased mobility, difficulty walking, decreased ROM, decreased strength, increased muscle spasms, impaired flexibility, and improper body mechanics.   ACTIVITY LIMITATIONS:  carrying, lifting, bending, standing, squatting, stairs, transfers, bathing, locomotion level, and caring for others  PARTICIPATION LIMITATIONS: meal prep, cleaning, laundry, shopping, community activity, and yard work  PERSONAL FACTORS: Fitness and 3+ comorbidities: R AKA, HTN, HLD  are also affecting patient's functional outcome.   REHAB POTENTIAL: Good  CLINICAL DECISION MAKING: Stable/uncomplicated  EVALUATION COMPLEXITY:  Low   GOALS: Goals reviewed with patient? Yes  SHORT TERM GOALS: Target date: 08/30/2023    Patient will be independent with HEP in order to improve functional outcomes. Baseline:  Goal status: goal met   2.  Patient will report at least 25% improvement in symptoms for improved quality of life. Baseline:  Goal status: goal met     LONG TERM GOALS: Target date: 09/20/2023 --> 11/21/23(updated 10/24/23)    Patient will report at least 75% improvement in symptoms for improved quality of life. Baseline:  Goal status: goal met   2.   Patient will demonstrate grade of 5/5 MMT grade in all tested musculature as evidence of improved strength to assist with stair ambulation and gait.   Baseline: see above Goal status: in progress 10/24/23  3.  Patient will be able to ambulate at least 100 feet in  with LRAD in order to demonstrate improved gait speed for community ambulation. Baseline: 24 feet with RW Goal status:  in progress 10/24/23  4.  Patient will be able to complete TUG in under 24.3 seconds in order to demonstrate improved mobility to reduce risk for falls. Baseline: 2:18 with RW (includes time to release air from prosthetic upon standing) Goal status:  in progress 10/24/23  5.  Patient will be able to complete 5x STS in under 20 seconds in order to demo improved functional strength. Baseline: 53.94 seconds with UE support on chair and RW, relies LLE Goal status: in progress 10/24/23     PLAN:  PT FREQUENCY: 1-2x/week  PT DURATION: 4 weeks  PLANNED INTERVENTIONS: Balance training, Patient/Family education, Joint manipulation, Joint mobilization, Stair training, Orthotic/Fit training, DME instructions, Aquatic Therapy, Dry Needling, Electrical stimulation, Spinal manipulation, Spinal mobilization, Cryotherapy, Moist heat, Compression bandaging, scar mobilization, Splintting, Taping, Traction, Ultrasound, Ionotophoresis 4mg /ml Dexamethasone, and  97110-Therapeutic  exercises, 97530- Therapeutic activity, [id number]- Neuromuscular re-education, 97535- Self Care, 29518- Manual therapy, [id number]- Gait training   PLAN FOR NEXT SESSION: hip mobility and hip strength, functional strength, balance and gait training, endurance   Seymour Bars PT, DPT

## 2023-11-16 ENCOUNTER — Encounter
Payer: Medicare Other | Attending: Physical Medicine and Rehabilitation | Admitting: Physical Medicine and Rehabilitation

## 2023-11-16 ENCOUNTER — Encounter: Payer: Self-pay | Admitting: Physical Medicine and Rehabilitation

## 2023-11-16 VITALS — BP 97/59 | HR 94 | Ht 70.0 in

## 2023-11-16 DIAGNOSIS — G546 Phantom limb syndrome with pain: Secondary | ICD-10-CM | POA: Diagnosis not present

## 2023-11-16 DIAGNOSIS — R269 Unspecified abnormalities of gait and mobility: Secondary | ICD-10-CM | POA: Insufficient documentation

## 2023-11-16 DIAGNOSIS — S78111A Complete traumatic amputation at level between right hip and knee, initial encounter: Secondary | ICD-10-CM | POA: Diagnosis not present

## 2023-11-16 MED ORDER — GABAPENTIN 100 MG PO CAPS: 200.0000 mg | ORAL_CAPSULE | Freq: Two times a day (BID) | ORAL | 5 refills | Status: DC

## 2023-11-16 NOTE — Patient Instructions (Signed)
Pt is a 72 yr old male with hx of recent R AKA 03/05/23 due to PAD;  phantom/residual limb pain; also has HTN, CKD- latest Cr 1.73 (now 1.60);  Acute on chronic anemia/ABLA; prediabetes, GERD Here for  f/u on R AKA.  Call me in 1 month to see if new dose of Gabapentin helpful- if not, will change to something else.   2. Gabapentin 200 mg in AM, 200 mg afternoon and 300 mg nightly.   3.  Wait on Duloxetine for now- but if gabapentin does work, will add it.    4. Discussed other options for nerve/phantom pain- decided to increase gabapentin for now.    5. F/U in 3 months- but call prior-  On R AKA  6. Will likely need Rx for R AKA new mold for porsthesis since current one isn't fitting, regardless of amount of socks.    7. Discussed using more socks to help leg fit better in prosthesis.  2-3 of them, if 4 ply-

## 2023-11-16 NOTE — Progress Notes (Signed)
Subjective:    Patient ID: [name], male    DOB: January 24, 1951, 72 y.o.   MRN: [id number]  HPI  Pt is a 72 yr old male with hx of recent R AKA 03/05/23 due to PAD;  phantom/residual limb pain; also has HTN, CKD- latest Cr 1.73 (now 1.60);  Acute on chronic anemia/ABLA; prediabetes, GERD Here for  f/u on R AKA.  Everything "stable" in LLE- no issues per pt.     RLE- AKA- Has belt to keep R AKA in place Hasn't been able to get it on right this AM.   Thinks the way was sitting on couch.    Pain- since went on Gabapentin-  The whole time getting 100 mg daily and 300 mg nightly-  Because pharmacy never filled right Rx either- and I sent in wrong dose in November when asked for a new Rx.   Made him a little sleepy first few days was taking right dose.    "Nubby" doesn't hurt- just the phantom pain.  Knee, ankle, foot are hurting-  Sometimes burning- squeezing pain- and stabbing pain on bottom of R foot- with knife.    Sent to hospital/ED- for Hb was low-  Feels low again today-  Hb 4 days ago- 8.9-  was 6.0-  1 month ago-  Went to hematology to figure out why blood level dropping.   When flushes stool, doesn't look at all.  Spent 12 hours in ED this last week.  Didn't figure out why felt so fatigued/SOB earlier in week. Did full work up- but nothing stood out.  Did CTA as well of chest.   Was late today- because couldn't get leg on correctly.  Securing it with belt- but Nubby getting smaller and getting limp- going to do another mold since R AKA is getting a lot smaller.  Goes up too high up on R AKA- almost to groin.  PT not going great- because leg too small and prosthesis not fitting.   Using  4 ply?- only 1 sock, but has tried multiple-    Pain Inventory Average Pain 7 Pain Right Now 0 My pain is intermittent, sharp, stabbing, and tingling  In the last 24 hours, has pain interfered with the following? General activity 0 Relation with others  0 Enjoyment of life 0 What TIME of day is your pain at its worst? varies Sleep (in general) Good  Pain is worse with: unsure Pain improves with: rest Relief from Meds: 0  Family History  Problem Relation Age of Onset   Gastric cancer Mother    Prostate cancer Father    Breast cancer Neg Hx    Pancreatic cancer Neg Hx    Social History   Socioeconomic History   Marital status: Single    Spouse name: Not on file   Number of children: 1   Years of education: Not on file   Highest education level: Some college, no degree  Occupational History    Comment: customer service   Tobacco Use   Smoking status: Former    Current packs/day: 0.00    Average packs/day: 0.3 packs/day for 21.0 years (5.3 ttl pk-yrs)    Types: Cigarettes    Start date: 11/29/2001    Quit date: 11/29/2022    Years since quitting: 0.9    Passive exposure: Never   Smokeless tobacco: Never  Vaping Use   Vaping status: Never Used  Substance and Sexual Activity   Alcohol use: Never  Drug use: Never   Sexual activity: Not Currently  Other Topics Concern   Not on file  Social History Narrative   Has one son.    Social Determinants of Health   Financial Resource Strain: Medium Risk (10/09/2023)   Overall Financial Resource Strain (CARDIA)    Difficulty of Paying Living Expenses: Somewhat hard  Food Insecurity: Food Insecurity Present (10/09/2023)   Hunger Vital Sign    Worried About Running Out of Food in the Last Year: Sometimes true    Ran Out of Food in the Last Year: Sometimes true  Transportation Needs: No Transportation Needs (10/09/2023)   PRAPARE - Administrator, Civil Service (Medical): No    Lack of Transportation (Non-Medical): No  Physical Activity: Unknown (10/09/2023)   Exercise Vital Sign    Days of Exercise per Week: 0 days    Minutes of Exercise per Session: Not on file  Stress: No Stress Concern Present (10/09/2023)   [employer] of Occupational Health -  Occupational Stress Questionnaire    Feeling of Stress : Not at all  Social Connections: Moderately Integrated (10/09/2023)   Social Connection and Isolation Panel [NHANES]    Frequency of Communication with Friends and Family: More than three times a week    Frequency of Social Gatherings with Friends and Family: Never    Attends Religious Services: 1 to 4 times per year    Active Member of [employer] or Organizations: Yes    Attends Banker Meetings: Patient declined    Marital Status: Never married   Past Surgical History:  Procedure Laterality Date   ABDOMINAL AORTOGRAM W/LOWER EXTREMITY N/A 11/20/2022   Procedure: ABDOMINAL AORTOGRAM W/LOWER EXTREMITY;  Surgeon: Maeola Harman, MD;  Location: [facility] INVASIVE CV LAB;  Service: Cardiovascular;  Laterality: N/A;   ABDOMINAL AORTOGRAM W/LOWER EXTREMITY N/A 03/05/2023   Procedure: ABDOMINAL AORTOGRAM W/LOWER EXTREMITY;  Surgeon: Maeola Harman, MD;  Location: [facility] INVASIVE CV LAB;  Service: Cardiovascular;  Laterality: N/A;   AMPUTATION Right 03/09/2023   Procedure: AMPUTATION ABOVE KNEE;  Surgeon: Maeola Harman, MD;  Location: [facility] OR;  Service: Vascular;  Laterality: Right;   COLONOSCOPY N/A 08/25/2020   Procedure: COLONOSCOPY;  Surgeon: Corbin Ade, MD;  [facility];  Service: Endoscopy;  Laterality: N/A;  9:30   COLONOSCOPY     CYSTOSCOPY WITH LITHOLAPAXY N/A 05/18/2021   Procedure: CYSTOSCOPY WITH LITHOLAPAXY;  Surgeon: Rene Paci, MD;  Location: [facility];  Service: Urology;  Laterality: N/A;  ONLY NEEDS  30 MIN   FEMORAL-TIBIAL BYPASS GRAFT Right 12/12/2022   Procedure: RIGHT COMMON FEMORAL-POSTERIOR TIBIAL ARTERY BYPASS WITH VEIN HARVESTING OF THE GREATER SAPHENOUS VEIN AND FEMORAL ENDARTERECTOMY WITH COMPOSITE GRAFT;  Surgeon: Maeola Harman, MD;  Location: [facility] OR;  Service: Vascular;  Laterality: Right;   POLYPECTOMY  08/25/2020   Procedure:  POLYPECTOMY;  Surgeon: Corbin Ade, MD;  [facility];  Service: Endoscopy;;   ROBOT ASSISTED LAPAROSCOPIC RADICAL PROSTATECTOMY  2011   TONSILLECTOMY  age 53   and adenoids removed   Past Surgical History:  Procedure Laterality Date   ABDOMINAL AORTOGRAM W/LOWER EXTREMITY N/A 11/20/2022   Procedure: ABDOMINAL AORTOGRAM W/LOWER EXTREMITY;  Surgeon: Maeola Harman, MD;  Location: [facility] INVASIVE CV LAB;  Service: Cardiovascular;  Laterality: N/A;   ABDOMINAL AORTOGRAM W/LOWER EXTREMITY N/A 03/05/2023   Procedure: ABDOMINAL AORTOGRAM W/LOWER EXTREMITY;  Surgeon: Maeola Harman, MD;  Location: [facility] INVASIVE CV LAB;  Service: Cardiovascular;  Laterality: N/A;   AMPUTATION Right 03/09/2023   Procedure: AMPUTATION ABOVE KNEE;  Surgeon: Maeola Harman, MD;  Location: [facility] OR;  Service: Vascular;  Laterality: Right;   COLONOSCOPY N/A 08/25/2020   Procedure: COLONOSCOPY;  Surgeon: Corbin Ade, MD;  [facility];  Service: Endoscopy;  Laterality: N/A;  9:30   COLONOSCOPY     CYSTOSCOPY WITH LITHOLAPAXY N/A 05/18/2021   Procedure: CYSTOSCOPY WITH LITHOLAPAXY;  Surgeon: Rene Paci, MD;  Location: [facility];  Service: Urology;  Laterality: N/A;  ONLY NEEDS  30 MIN   FEMORAL-TIBIAL BYPASS GRAFT Right 12/12/2022   Procedure: RIGHT COMMON FEMORAL-POSTERIOR TIBIAL ARTERY BYPASS WITH VEIN HARVESTING OF THE GREATER SAPHENOUS VEIN AND FEMORAL ENDARTERECTOMY WITH COMPOSITE GRAFT;  Surgeon: Maeola Harman, MD;  Location: [facility] OR;  Service: Vascular;  Laterality: Right;   POLYPECTOMY  08/25/2020   Procedure: POLYPECTOMY;  Surgeon: Corbin Ade, MD;  [facility];  Service: Endoscopy;;   ROBOT ASSISTED LAPAROSCOPIC RADICAL PROSTATECTOMY  2011   TONSILLECTOMY  age 54   and adenoids removed   Past Medical History:  Diagnosis Date   Family history of metabolic acidosis with increased anion gap 03/05/2023   GERD  (gastroesophageal reflux disease)    History of kidney stones    Hyperlipidemia    Hypertension    Prostate cancer [facility]) 2011   radiation june 2020   Umbilical hernia    Wears dentures    full   Wears glasses    Wears partial dentures    lower   Ht 5\' 10"  (1.778 m)   BMI 30.56 kg/m   Opioid Risk Score:   Fall Risk Score:  `1  Depression screen [facility] 2/9     10/10/2023    9:28 AM 07/10/2023    9:11 AM 05/24/2023    1:30 PM 04/25/2023   11:07 AM 04/10/2023    8:14 AM  Depression screen PHQ 2/9  Decreased Interest 0 0 0 0 0  Down, Depressed, Hopeless 0 0 0 0 0  PHQ - 2 Score 0 0 0 0 0  Altered sleeping  0 0 0 0  Tired, decreased energy  0 0 0 0  Change in appetite  0 0 0 0  Feeling bad or failure about yourself   0 0 0 0  Trouble concentrating  0 0 0 0  Moving slowly or fidgety/restless  0 0 0 0  Suicidal thoughts  0 0 0 0  PHQ-9 Score  0 0 0 0  Difficult doing work/chores  Not difficult at all Not difficult at all  Not difficult at all      Review of Systems  Musculoskeletal:  Positive for gait problem.  All other systems reviewed and are negative.     Objective:   Physical Exam  Awake alert, appears tired; holding head up with hand; NAD Wearing mask Wearing prosthesis on R AKA- but bottoming out due to too thin of R AKA       Assessment & Plan:   Pt is a 72 yr old male with hx of recent R AKA 03/05/23 due to PAD;  phantom/residual limb pain; also has HTN, CKD- latest Cr 1.73 (now 1.60);  Acute on chronic anemia/ABLA; prediabetes, GERD Here for  f/u on R AKA.  Call me in 1 month to see if new dose of Gabapentin helpful- if not, will change to something else.   2. Gabapentin 200 mg in  AM, 200 mg afternoon and 300 mg nightly.   3.  Wait on Duloxetine for now- but if gabapentin does work, will add it.    4. Discussed other options for nerve/phantom pain- decided to increase gabapentin for now.    5. F/U in 3 months- but call prior-  On R AKA  6. Will  likely need Rx for R AKA new mold for porsthesis since current one isn't fitting, regardless of amount of socks.    7. Discussed using more socks to help leg fit better in prosthesis.  2-3 of them, if 4 ply-   I spent a total of  30  minutes on total care today- >50% coordination of care- due to  d/w pt about phantom pain options as wlel as deciding on meds- also discussed how to use socks to build up R AKA- and also discussed pt's fatigue and low Hb.

## 2023-11-19 ENCOUNTER — Other Ambulatory Visit: Payer: Self-pay | Admitting: *Deleted

## 2023-11-19 DIAGNOSIS — R591 Generalized enlarged lymph nodes: Secondary | ICD-10-CM

## 2023-11-20 ENCOUNTER — Encounter (HOSPITAL_COMMUNITY): Payer: Medicare Other

## 2023-11-21 ENCOUNTER — Other Ambulatory Visit: Payer: Self-pay | Admitting: Radiology

## 2023-11-21 DIAGNOSIS — D649 Anemia, unspecified: Secondary | ICD-10-CM

## 2023-11-21 NOTE — Progress Notes (Signed)
Vincent Come, MD  [name] OK for US guided left lateral chest wall subcutaneous mass biopsy.  Chest CT - 11/18, image 107, series 4.  [name]       Previous Messages    ----- Message ----- From: [name] Sent: 11/19/2023   3:23 PM EST To: [name]; Ir Procedure Requests Subject: [place] Core Biopsy ( lymph nodes)                  Procedure : [place] Core Biopsy ( lymph nodes)  Reason: US guided bx right supraclavicular node Dx: Lymphadenopathy [R59.1 (ICD-10-CM)]    History: CT Angio chest PE w/wo , chest xray  Provider: Doreatha Massed, MD  Provider contact: [phone]

## 2023-11-23 ENCOUNTER — Ambulatory Visit (HOSPITAL_COMMUNITY): Payer: Medicare Other

## 2023-11-26 ENCOUNTER — Ambulatory Visit (HOSPITAL_COMMUNITY)
Admission: RE | Admit: 2023-11-26 | Discharge: 2023-11-26 | Disposition: A | Discharge status: Home / Self Care | Payer: Medicare Other | Source: Ambulatory Visit | Attending: Hematology | Admitting: Hematology

## 2023-11-26 ENCOUNTER — Other Ambulatory Visit: Payer: Self-pay

## 2023-11-26 ENCOUNTER — Encounter (HOSPITAL_COMMUNITY): Payer: Self-pay | Admitting: Emergency Medicine

## 2023-11-26 ENCOUNTER — Inpatient Hospital Stay (HOSPITAL_COMMUNITY)
Admission: EM | Admit: 2023-11-26 | Discharge: 2023-12-11 | DRG: 854 | Disposition: A | Discharge status: Skilled Nursing Facility | Payer: Medicare Other | Attending: Internal Medicine | Admitting: Internal Medicine

## 2023-11-26 ENCOUNTER — Ambulatory Visit (HOSPITAL_COMMUNITY): Payer: Medicare Other

## 2023-11-26 ENCOUNTER — Emergency Department (HOSPITAL_COMMUNITY): Payer: Medicare Other

## 2023-11-26 DIAGNOSIS — E876 Hypokalemia: Secondary | ICD-10-CM | POA: Diagnosis present

## 2023-11-26 DIAGNOSIS — C801 Malignant (primary) neoplasm, unspecified: Secondary | ICD-10-CM | POA: Diagnosis present

## 2023-11-26 DIAGNOSIS — A419 Sepsis, unspecified organism: Secondary | ICD-10-CM | POA: Diagnosis not present

## 2023-11-26 DIAGNOSIS — M6282 Rhabdomyolysis: Secondary | ICD-10-CM | POA: Diagnosis not present

## 2023-11-26 DIAGNOSIS — C7971 Secondary malignant neoplasm of right adrenal gland: Secondary | ICD-10-CM | POA: Diagnosis present

## 2023-11-26 DIAGNOSIS — M7989 Other specified soft tissue disorders: Secondary | ICD-10-CM | POA: Diagnosis not present

## 2023-11-26 DIAGNOSIS — R4182 Altered mental status, unspecified: Secondary | ICD-10-CM | POA: Diagnosis present

## 2023-11-26 DIAGNOSIS — Z860101 Personal history of adenomatous and serrated colon polyps: Secondary | ICD-10-CM

## 2023-11-26 DIAGNOSIS — I82542 Chronic embolism and thrombosis of left tibial vein: Secondary | ICD-10-CM | POA: Diagnosis not present

## 2023-11-26 DIAGNOSIS — K259 Gastric ulcer, unspecified as acute or chronic, without hemorrhage or perforation: Secondary | ICD-10-CM | POA: Diagnosis not present

## 2023-11-26 DIAGNOSIS — I82409 Acute embolism and thrombosis of unspecified deep veins of unspecified lower extremity: Secondary | ICD-10-CM | POA: Insufficient documentation

## 2023-11-26 DIAGNOSIS — E66811 Obesity, class 1: Secondary | ICD-10-CM | POA: Diagnosis present

## 2023-11-26 DIAGNOSIS — Z6830 Body mass index (BMI) 30.0-30.9, adult: Secondary | ICD-10-CM

## 2023-11-26 DIAGNOSIS — Z743 Need for continuous supervision: Secondary | ICD-10-CM | POA: Diagnosis not present

## 2023-11-26 DIAGNOSIS — C7989 Secondary malignant neoplasm of other specified sites: Secondary | ICD-10-CM | POA: Diagnosis not present

## 2023-11-26 DIAGNOSIS — Z7189 Other specified counseling: Secondary | ICD-10-CM | POA: Diagnosis not present

## 2023-11-26 DIAGNOSIS — C770 Secondary and unspecified malignant neoplasm of lymph nodes of head, face and neck: Secondary | ICD-10-CM | POA: Diagnosis not present

## 2023-11-26 DIAGNOSIS — E872 Acidosis, unspecified: Secondary | ICD-10-CM | POA: Diagnosis present

## 2023-11-26 DIAGNOSIS — R590 Localized enlarged lymph nodes: Secondary | ICD-10-CM | POA: Diagnosis present

## 2023-11-26 DIAGNOSIS — Y92013 Bedroom of single-family (private) house as the place of occurrence of the external cause: Secondary | ICD-10-CM

## 2023-11-26 DIAGNOSIS — S0003XA Contusion of scalp, initial encounter: Secondary | ICD-10-CM | POA: Diagnosis present

## 2023-11-26 DIAGNOSIS — I70201 Unspecified atherosclerosis of native arteries of extremities, right leg: Secondary | ICD-10-CM | POA: Diagnosis present

## 2023-11-26 DIAGNOSIS — N179 Acute kidney failure, unspecified: Secondary | ICD-10-CM | POA: Diagnosis not present

## 2023-11-26 DIAGNOSIS — N1831 Chronic kidney disease, stage 3a: Secondary | ICD-10-CM | POA: Diagnosis not present

## 2023-11-26 DIAGNOSIS — C7972 Secondary malignant neoplasm of left adrenal gland: Secondary | ICD-10-CM | POA: Diagnosis present

## 2023-11-26 DIAGNOSIS — C7951 Secondary malignant neoplasm of bone: Secondary | ICD-10-CM | POA: Diagnosis not present

## 2023-11-26 DIAGNOSIS — Z66 Do not resuscitate: Secondary | ICD-10-CM | POA: Diagnosis not present

## 2023-11-26 DIAGNOSIS — E871 Hypo-osmolality and hyponatremia: Secondary | ICD-10-CM | POA: Diagnosis not present

## 2023-11-26 DIAGNOSIS — I739 Peripheral vascular disease, unspecified: Secondary | ICD-10-CM | POA: Diagnosis not present

## 2023-11-26 DIAGNOSIS — Z7901 Long term (current) use of anticoagulants: Secondary | ICD-10-CM

## 2023-11-26 DIAGNOSIS — C499 Malignant neoplasm of connective and soft tissue, unspecified: Secondary | ICD-10-CM | POA: Diagnosis not present

## 2023-11-26 DIAGNOSIS — Z8546 Personal history of malignant neoplasm of prostate: Secondary | ICD-10-CM | POA: Diagnosis not present

## 2023-11-26 DIAGNOSIS — E785 Hyperlipidemia, unspecified: Secondary | ICD-10-CM | POA: Diagnosis not present

## 2023-11-26 DIAGNOSIS — Z923 Personal history of irradiation: Secondary | ICD-10-CM

## 2023-11-26 DIAGNOSIS — I82621 Acute embolism and thrombosis of deep veins of right upper extremity: Secondary | ICD-10-CM | POA: Diagnosis not present

## 2023-11-26 DIAGNOSIS — R627 Adult failure to thrive: Secondary | ICD-10-CM | POA: Diagnosis present

## 2023-11-26 DIAGNOSIS — C792 Secondary malignant neoplasm of skin: Secondary | ICD-10-CM | POA: Diagnosis not present

## 2023-11-26 DIAGNOSIS — L03115 Cellulitis of right lower limb: Secondary | ICD-10-CM | POA: Diagnosis not present

## 2023-11-26 DIAGNOSIS — R680 Hypothermia, not associated with low environmental temperature: Secondary | ICD-10-CM | POA: Diagnosis present

## 2023-11-26 DIAGNOSIS — I82532 Chronic embolism and thrombosis of left popliteal vein: Secondary | ICD-10-CM | POA: Diagnosis present

## 2023-11-26 DIAGNOSIS — W19XXXA Unspecified fall, initial encounter: Secondary | ICD-10-CM | POA: Diagnosis present

## 2023-11-26 DIAGNOSIS — I82513 Chronic embolism and thrombosis of femoral vein, bilateral: Secondary | ICD-10-CM | POA: Diagnosis present

## 2023-11-26 DIAGNOSIS — K219 Gastro-esophageal reflux disease without esophagitis: Secondary | ICD-10-CM | POA: Diagnosis present

## 2023-11-26 DIAGNOSIS — Z7982 Long term (current) use of aspirin: Secondary | ICD-10-CM

## 2023-11-26 DIAGNOSIS — L0390 Cellulitis, unspecified: Secondary | ICD-10-CM

## 2023-11-26 DIAGNOSIS — Z8249 Family history of ischemic heart disease and other diseases of the circulatory system: Secondary | ICD-10-CM

## 2023-11-26 DIAGNOSIS — R591 Generalized enlarged lymph nodes: Secondary | ICD-10-CM | POA: Diagnosis not present

## 2023-11-26 DIAGNOSIS — I82502 Chronic embolism and thrombosis of unspecified deep veins of left lower extremity: Secondary | ICD-10-CM | POA: Diagnosis not present

## 2023-11-26 DIAGNOSIS — D649 Anemia, unspecified: Secondary | ICD-10-CM | POA: Diagnosis present

## 2023-11-26 DIAGNOSIS — E440 Moderate protein-calorie malnutrition: Secondary | ICD-10-CM | POA: Diagnosis not present

## 2023-11-26 DIAGNOSIS — K769 Liver disease, unspecified: Secondary | ICD-10-CM | POA: Diagnosis present

## 2023-11-26 DIAGNOSIS — G546 Phantom limb syndrome with pain: Secondary | ICD-10-CM | POA: Diagnosis not present

## 2023-11-26 DIAGNOSIS — C799 Secondary malignant neoplasm of unspecified site: Secondary | ICD-10-CM | POA: Diagnosis not present

## 2023-11-26 DIAGNOSIS — I82402 Acute embolism and thrombosis of unspecified deep veins of left lower extremity: Secondary | ICD-10-CM | POA: Diagnosis not present

## 2023-11-26 DIAGNOSIS — I700 Atherosclerosis of aorta: Secondary | ICD-10-CM | POA: Diagnosis present

## 2023-11-26 DIAGNOSIS — R571 Hypovolemic shock: Secondary | ICD-10-CM | POA: Diagnosis not present

## 2023-11-26 DIAGNOSIS — E861 Hypovolemia: Secondary | ICD-10-CM | POA: Diagnosis present

## 2023-11-26 DIAGNOSIS — Z515 Encounter for palliative care: Secondary | ICD-10-CM | POA: Diagnosis not present

## 2023-11-26 DIAGNOSIS — D630 Anemia in neoplastic disease: Secondary | ICD-10-CM | POA: Diagnosis not present

## 2023-11-26 DIAGNOSIS — R29890 Loss of height: Secondary | ICD-10-CM | POA: Diagnosis not present

## 2023-11-26 DIAGNOSIS — R222 Localized swelling, mass and lump, trunk: Secondary | ICD-10-CM | POA: Diagnosis not present

## 2023-11-26 DIAGNOSIS — Z89611 Acquired absence of right leg above knee: Secondary | ICD-10-CM | POA: Diagnosis not present

## 2023-11-26 DIAGNOSIS — M4804 Spinal stenosis, thoracic region: Secondary | ICD-10-CM | POA: Diagnosis present

## 2023-11-26 DIAGNOSIS — Z87442 Personal history of urinary calculi: Secondary | ICD-10-CM

## 2023-11-26 DIAGNOSIS — R6521 Severe sepsis with septic shock: Secondary | ICD-10-CM

## 2023-11-26 DIAGNOSIS — Z800 Family history of malignant neoplasm of digestive organs: Secondary | ICD-10-CM

## 2023-11-26 DIAGNOSIS — T796XXA Traumatic ischemia of muscle, initial encounter: Secondary | ICD-10-CM | POA: Diagnosis not present

## 2023-11-26 DIAGNOSIS — S0990XA Unspecified injury of head, initial encounter: Secondary | ICD-10-CM | POA: Diagnosis not present

## 2023-11-26 DIAGNOSIS — B379 Candidiasis, unspecified: Secondary | ICD-10-CM | POA: Diagnosis present

## 2023-11-26 DIAGNOSIS — I82552 Chronic embolism and thrombosis of left peroneal vein: Secondary | ICD-10-CM | POA: Diagnosis present

## 2023-11-26 DIAGNOSIS — I10 Essential (primary) hypertension: Secondary | ICD-10-CM

## 2023-11-26 DIAGNOSIS — I2510 Atherosclerotic heart disease of native coronary artery without angina pectoris: Secondary | ICD-10-CM | POA: Diagnosis present

## 2023-11-26 DIAGNOSIS — I82611 Acute embolism and thrombosis of superficial veins of right upper extremity: Secondary | ICD-10-CM | POA: Diagnosis not present

## 2023-11-26 DIAGNOSIS — Z7401 Bed confinement status: Secondary | ICD-10-CM | POA: Diagnosis not present

## 2023-11-26 DIAGNOSIS — R918 Other nonspecific abnormal finding of lung field: Secondary | ICD-10-CM | POA: Diagnosis not present

## 2023-11-26 DIAGNOSIS — Z79899 Other long term (current) drug therapy: Secondary | ICD-10-CM

## 2023-11-26 DIAGNOSIS — R54 Age-related physical debility: Secondary | ICD-10-CM | POA: Diagnosis present

## 2023-11-26 DIAGNOSIS — M8458XA Pathological fracture in neoplastic disease, other specified site, initial encounter for fracture: Secondary | ICD-10-CM | POA: Diagnosis present

## 2023-11-26 DIAGNOSIS — Z87891 Personal history of nicotine dependence: Secondary | ICD-10-CM | POA: Diagnosis not present

## 2023-11-26 DIAGNOSIS — R7401 Elevation of levels of liver transaminase levels: Secondary | ICD-10-CM | POA: Insufficient documentation

## 2023-11-26 DIAGNOSIS — S199XXA Unspecified injury of neck, initial encounter: Secondary | ICD-10-CM | POA: Diagnosis not present

## 2023-11-26 DIAGNOSIS — K3189 Other diseases of stomach and duodenum: Secondary | ICD-10-CM | POA: Diagnosis present

## 2023-11-26 DIAGNOSIS — N183 Chronic kidney disease, stage 3 unspecified: Secondary | ICD-10-CM | POA: Diagnosis not present

## 2023-11-26 DIAGNOSIS — N4822 Cellulitis of corpus cavernosum and penis: Secondary | ICD-10-CM | POA: Diagnosis present

## 2023-11-26 DIAGNOSIS — Z91013 Allergy to seafood: Secondary | ICD-10-CM

## 2023-11-26 DIAGNOSIS — I82B11 Acute embolism and thrombosis of right subclavian vein: Secondary | ICD-10-CM | POA: Diagnosis not present

## 2023-11-26 DIAGNOSIS — I129 Hypertensive chronic kidney disease with stage 1 through stage 4 chronic kidney disease, or unspecified chronic kidney disease: Secondary | ICD-10-CM | POA: Diagnosis present

## 2023-11-26 DIAGNOSIS — K429 Umbilical hernia without obstruction or gangrene: Secondary | ICD-10-CM | POA: Diagnosis not present

## 2023-11-26 DIAGNOSIS — Z9079 Acquired absence of other genital organ(s): Secondary | ICD-10-CM

## 2023-11-26 DIAGNOSIS — D6869 Other thrombophilia: Secondary | ICD-10-CM | POA: Diagnosis present

## 2023-11-26 DIAGNOSIS — Z8042 Family history of malignant neoplasm of prostate: Secondary | ICD-10-CM

## 2023-11-26 DIAGNOSIS — I82501 Chronic embolism and thrombosis of unspecified deep veins of right lower extremity: Secondary | ICD-10-CM | POA: Diagnosis not present

## 2023-11-26 DIAGNOSIS — L89892 Pressure ulcer of other site, stage 2: Secondary | ICD-10-CM | POA: Diagnosis present

## 2023-11-26 DIAGNOSIS — N3281 Overactive bladder: Secondary | ICD-10-CM | POA: Diagnosis not present

## 2023-11-26 DIAGNOSIS — R531 Weakness: Secondary | ICD-10-CM | POA: Diagnosis not present

## 2023-11-26 DIAGNOSIS — E860 Dehydration: Secondary | ICD-10-CM | POA: Diagnosis present

## 2023-11-26 DIAGNOSIS — R578 Other shock: Secondary | ICD-10-CM | POA: Diagnosis not present

## 2023-11-26 DIAGNOSIS — L8990 Pressure ulcer of unspecified site, unspecified stage: Secondary | ICD-10-CM | POA: Insufficient documentation

## 2023-11-26 HISTORY — DX: Acquired absence of right leg above knee: Z89.611

## 2023-11-26 HISTORY — DX: Sepsis, unspecified organism: A41.9

## 2023-11-26 HISTORY — DX: Secondary malignant neoplasm of unspecified site: C79.9

## 2023-11-26 HISTORY — DX: Peripheral vascular disease, unspecified: I73.9

## 2023-11-26 LAB — CBC WITH DIFFERENTIAL/PLATELET
Abs Immature Granulocytes: 0.07 10*3/uL (ref 0.00–0.07)
Basophils Absolute: 0 10*3/uL (ref 0.0–0.1)
Basophils Relative: 0 %
Eosinophils Absolute: 0 10*3/uL (ref 0.0–0.5)
Eosinophils Relative: 0 %
HCT: 30.7 % — ABNORMAL LOW (ref 39.0–52.0)
Hemoglobin: 9.6 g/dL — ABNORMAL LOW (ref 13.0–17.0)
Immature Granulocytes: 1 %
Lymphocytes Relative: 7 %
Lymphs Abs: 0.9 10*3/uL (ref 0.7–4.0)
MCH: 27.5 pg (ref 26.0–34.0)
MCHC: 31.3 g/dL (ref 30.0–36.0)
MCV: 88 fL (ref 80.0–100.0)
Monocytes Absolute: 0.5 10*3/uL (ref 0.1–1.0)
Monocytes Relative: 3 %
Neutro Abs: 12.8 10*3/uL — ABNORMAL HIGH (ref 1.7–7.7)
Neutrophils Relative %: 89 %
Platelets: 313 10*3/uL (ref 150–400)
RBC: 3.49 MIL/uL — ABNORMAL LOW (ref 4.22–5.81)
RDW: 17.9 % — ABNORMAL HIGH (ref 11.5–15.5)
WBC: 14.4 10*3/uL — ABNORMAL HIGH (ref 4.0–10.5)
nRBC: 0 % (ref 0.0–0.2)

## 2023-11-26 LAB — URINALYSIS, ROUTINE W REFLEX MICROSCOPIC
Bacteria, UA: NONE SEEN
Bilirubin Urine: NEGATIVE
Glucose, UA: NEGATIVE mg/dL
Ketones, ur: NEGATIVE mg/dL
Leukocytes,Ua: NEGATIVE
Nitrite: NEGATIVE
Protein, ur: NEGATIVE mg/dL
Specific Gravity, Urine: 1.025 (ref 1.005–1.030)
pH: 5 (ref 5.0–8.0)

## 2023-11-26 LAB — COMPREHENSIVE METABOLIC PANEL
ALT: 69 U/L — ABNORMAL HIGH (ref 0–44)
AST: 149 U/L — ABNORMAL HIGH (ref 15–41)
Albumin: 2.6 g/dL — ABNORMAL LOW (ref 3.5–5.0)
Alkaline Phosphatase: 104 U/L (ref 38–126)
Anion gap: 17 — ABNORMAL HIGH (ref 5–15)
BUN: 97 mg/dL — ABNORMAL HIGH (ref 8–23)
CO2: 21 mmol/L — ABNORMAL LOW (ref 22–32)
Calcium: 12.4 mg/dL — ABNORMAL HIGH (ref 8.9–10.3)
Chloride: 101 mmol/L (ref 98–111)
Creatinine, Ser: 2.21 mg/dL — ABNORMAL HIGH (ref 0.61–1.24)
GFR, Estimated: 31 mL/min — ABNORMAL LOW (ref >60)
Glucose, Bld: 165 mg/dL — ABNORMAL HIGH (ref 70–99)
Potassium: 4 mmol/L (ref 3.5–5.1)
Sodium: 139 mmol/L (ref 135–145)
Total Bilirubin: 1.1 mg/dL (ref <1.2)
Total Protein: 8 g/dL (ref 6.5–8.1)

## 2023-11-26 LAB — GLUCOSE, CAPILLARY
Glucose-Capillary: 137 mg/dL — ABNORMAL HIGH (ref 70–99)
Glucose-Capillary: 137 mg/dL — ABNORMAL HIGH (ref 70–99)
Glucose-Capillary: 143 mg/dL — ABNORMAL HIGH (ref 70–99)

## 2023-11-26 LAB — LACTIC ACID, PLASMA
Lactic Acid, Venous: 2 mmol/L (ref 0.5–1.9)
Lactic Acid, Venous: 1.6 mmol/L (ref 0.5–1.9)

## 2023-11-26 LAB — CK: Total CK: 6948 U/L — ABNORMAL HIGH (ref 49–397)

## 2023-11-26 LAB — TSH: TSH: 2.404 u[IU]/mL (ref 0.350–4.500)

## 2023-11-26 MED ORDER — LIDOCAINE HCL URETHRAL/MUCOSAL 2 % EX GEL
1.0000 | Freq: Once | CUTANEOUS | Status: AC
  Administered 2023-11-26: 1 via URETHRAL
  Filled 2023-11-26: qty 6

## 2023-11-26 MED ORDER — LACTATED RINGERS IV BOLUS
1000.0000 mL | Freq: Once | INTRAVENOUS | Status: AC
  Administered 2023-11-26: 1000 mL via INTRAVENOUS

## 2023-11-26 MED ORDER — PIPERACILLIN-TAZOBACTAM 3.375 G IVPB
3.3750 g | Freq: Three times a day (TID) | INTRAVENOUS | Status: DC
  Administered 2023-11-26 – 2023-11-28 (×5): 3.375 g via INTRAVENOUS
  Filled 2023-11-26 (×4): qty 50

## 2023-11-26 MED ORDER — VANCOMYCIN HCL 1500 MG/300ML IV SOLN: 1500.0000 mg | INTRAVENOUS | Status: DC

## 2023-11-26 MED ORDER — ASPIRIN 81 MG PO CHEW
81.0000 mg | CHEWABLE_TABLET | Freq: Every day | ORAL | Status: DC
  Administered 2023-11-26 – 2023-12-11 (×16): 81 mg via ORAL
  Filled 2023-11-26 (×16): qty 1

## 2023-11-26 MED ORDER — NOREPINEPHRINE 4 MG/250ML-% IV SOLN
0.0000 ug/min | INTRAVENOUS | Status: DC
  Administered 2023-11-26: 7 ug/min via INTRAVENOUS
  Administered 2023-11-26: 2 ug/min via INTRAVENOUS
  Administered 2023-11-27: 4 ug/min via INTRAVENOUS
  Filled 2023-11-26 (×3): qty 250

## 2023-11-26 MED ORDER — POLYETHYLENE GLYCOL 3350 17 G PO PACK: 17.0000 g | PACK | Freq: Every day | ORAL | Status: DC | PRN

## 2023-11-26 MED ORDER — SODIUM CHLORIDE 0.9 % IV SOLN
2.0000 g | Freq: Once | INTRAVENOUS | Status: AC
  Administered 2023-11-26: 2 g via INTRAVENOUS
  Filled 2023-11-26: qty 12.5

## 2023-11-26 MED ORDER — VANCOMYCIN HCL IN DEXTROSE 1-5 GM/200ML-% IV SOLN
1000.0000 mg | INTRAVENOUS | Status: AC
  Administered 2023-11-26 (×2): 1000 mg via INTRAVENOUS
  Filled 2023-11-26 (×2): qty 200

## 2023-11-26 MED ORDER — SODIUM BICARBONATE 8.4 % IV SOLN
100.0000 meq | Freq: Once | INTRAVENOUS | Status: AC
  Administered 2023-11-26: 100 meq via INTRAVENOUS
  Filled 2023-11-26: qty 50

## 2023-11-26 MED ORDER — SODIUM CHLORIDE 0.9 % IV BOLUS
1000.0000 mL | Freq: Once | INTRAVENOUS | Status: AC
  Administered 2023-11-26: 1000 mL via INTRAVENOUS

## 2023-11-26 MED ORDER — IOHEXOL 300 MG/ML  SOLN
75.0000 mL | Freq: Once | INTRAMUSCULAR | Status: AC | PRN
  Administered 2023-11-26: 75 mL via INTRAVENOUS

## 2023-11-26 MED ORDER — DOCUSATE SODIUM 100 MG PO CAPS: 100.0000 mg | ORAL_CAPSULE | Freq: Two times a day (BID) | ORAL | Status: DC | PRN

## 2023-11-26 MED ORDER — HEPARIN SODIUM (PORCINE) 5000 UNIT/ML IJ SOLN
5000.0000 [IU] | Freq: Three times a day (TID) | INTRAMUSCULAR | Status: DC
  Administered 2023-11-26 – 2023-11-27 (×2): 5000 [IU] via SUBCUTANEOUS
  Filled 2023-11-26 (×2): qty 1

## 2023-11-26 MED ORDER — LACTATED RINGERS IV SOLN: INTRAVENOUS | Status: DC

## 2023-11-26 MED ORDER — LACTATED RINGERS IV SOLN: INTRAVENOUS | Status: AC

## 2023-11-26 MED ORDER — SODIUM CHLORIDE 0.9 % IV SOLN: 250.0000 mL | INTRAVENOUS | Status: AC

## 2023-11-26 NOTE — ED Notes (Signed)
Provider made aware of Patients Blood Pressure dropping.

## 2023-11-26 NOTE — Progress Notes (Signed)
Pharmacy Antibiotic Note  WILVER ENDRIZZI is a 72 y.o. male admitted on 11/26/2023 with sepsis.  Pharmacy has been consulted for vancomycin dosing.  Plan: Vancomycin 2000 mg IV x 1 dose. Vancomycin 1500 mg IV every 48 hours. Cefepime 2000 mg IV x 1 dose. F/U additional orders Monitor labs, c/s, and vanco levels as indicated.     Temp (24hrs), Avg:95.3 F (35.2 C), Min:95.3 F (35.2 C), Max:95.3 F (35.2 C)  Recent Labs  Lab 11/26/23 1033  WBC 14.4*  CREATININE 2.21*    Estimated Creatinine Clearance: 34.7 mL/min (A) (by C-G formula based on SCr of 2.21 mg/dL (H)).    Allergies  Allergen Reactions   Shellfish Allergy Swelling    Antimicrobials this admission: Vanco 12/2 >> Cefepime 12/2   Microbiology results: 12/2 BCx: pending   Thank you for allowing pharmacy to be a part of this patient's care.  Judeth Cornfield, PharmD Clinical Pharmacist 11/26/2023 1:03 PM

## 2023-11-26 NOTE — ED Provider Notes (Signed)
[place] EMERGENCY DEPARTMENT AT [facility] Provider Note  CSN: [id number] Arrival date & time: 11/26/23 0272  Chief Complaint(s) Fall  HPI [name] is a 72 y.o. male with PMH recent AKA in March 2024 secondary to PAD, HTN, CKD, prostate cancer status post radiation in 2020 with bone metastases who presents emergency department for evaluation of a fall and altered mental status.  History obtained from patient's sister who states that she last saw him 48 hours ago.  She went to visit him this morning and found him on the ground of his bedroom.  Patient states that he feels significantly weaker than normal and patient's sister states that he is off from his normal mental status baseline.  Denies chest pain, shortness of breath, abdominal pain, nausea, vomiting or other systemic symptoms.  Of note, patient arrives with a wound over the testicles that is new with erythema at the skin folds of the perineum bilaterally.  Right periorbital swelling seen on arrival.   Past Medical History Past Medical History:  Diagnosis Date   Family history of metabolic acidosis with increased anion gap 03/05/2023   GERD (gastroesophageal reflux disease)    History of kidney stones    Hyperlipidemia    Hypertension    Prostate cancer [facility]) 2011   radiation june 2020   Umbilical hernia    Wears dentures    full   Wears glasses    Wears partial dentures    lower   Patient Active Problem List   Diagnosis Date Noted   Anemia 10/19/2023   Abdominal pain 10/10/2023   Cough 10/10/2023   Abnormal gait 07/27/2023   Hypertension 07/10/2023   Blister of left leg 05/24/2023   Acute blood loss anemia 03/27/2023   Phantom pain after amputation of lower extremity (HCC) 03/27/2023   Adjustment disorder with mixed anxiety and depressed mood 03/15/2023   Above knee amputation of right lower extremity (HCC) 03/13/2023   CKD (chronic kidney disease) stage 3, GFR 30-59 ml/min (HCC) 03/13/2023    Cellulitis of right lower extremity 03/09/2023   Fever 03/09/2023   Non-traumatic rhabdomyolysis 03/09/2023   Bandemia 03/09/2023   Acute kidney injury superimposed on chronic kidney disease (HCC) 03/05/2023   Hyperkalemia 03/05/2023   Normocytic anemia 03/05/2023   History of prostate cancer 03/05/2023   Essential hypertension 03/05/2023   Metabolic acidosis, increased anion gap 03/05/2023   Critical limb ischemia of right lower extremity (HCC) 12/12/2022   Malignant neoplasm of prostate (HCC) 03/18/2019   Home Medication(s) Prior to Admission medications   Medication Sig Start Date End Date Taking? Authorizing Provider  acetaminophen (TYLENOL) 325 MG tablet Take 1-2 tablets (325-650 mg total) by mouth every 4 (four) hours as needed for mild pain. 03/26/23   Love, Evlyn Kanner, PA-C  albuterol (VENTOLIN HFA) 108 (90 Base) MCG/ACT inhaler Inhale 2 puffs into the lungs every 6 (six) hours as needed for wheezing or shortness of breath. 10/19/23   Del Nigel Berthold, FNP  aspirin EC 81 MG tablet Take 81 mg by mouth daily.    [provider]  atorvastatin (LIPITOR) 40 MG tablet Take 1 tablet (40 mg total) by mouth every morning. 04/05/23   Maeola Harman, MD  benzonatate (TESSALON) 200 MG capsule Take 1 capsule (200 mg total) by mouth 2 (two) times daily as needed for cough. 10/10/23   Del Nigel Berthold, FNP  cyclobenzaprine (FLEXERIL) 5 MG tablet Take 1 tablet (5 mg total) by mouth 3 (three)  times daily as needed for muscle spasms. 04/25/23   Lovorn, Aundra Millet, MD  gabapentin (NEURONTIN) 100 MG capsule Take 2 capsules (200 mg total) by mouth 2 (two) times daily. Along with 300 mg nightly- for phantom pain- due to R AKA 11/16/23   Lovorn, Aundra Millet, MD  gabapentin (NEURONTIN) 300 MG capsule Take 1 capsule by mouth at bedtime 10/30/23   Lovorn, Aundra Millet, MD  iron polysaccharides (NIFEREX) 150 MG capsule Take 1 capsule (150 mg total) by mouth daily. 10/19/23   Del Nigel Berthold,  FNP  levocetirizine (XYZAL) 5 MG tablet Take 1 tablet (5 mg total) by mouth every evening. 05/24/23   Del Nigel Berthold, FNP  lisinopril-hydrochlorothiazide (ZESTORETIC) 10-12.5 MG tablet Take 1 tablet by mouth daily. 10/10/23   Del Nigel Berthold, FNP  metoprolol tartrate (LOPRESSOR) 25 MG tablet Take 0.5 tablets (12.5 mg total) by mouth 2 (two) times daily. Will give 1 month supply- and get from NP/PCP in future. 10/10/23   Del Nigel Berthold, FNP  Multiple Vitamins-Minerals (CENTRUM SILVER PO) Take 1 tablet by mouth daily.     [provider]  mupirocin ointment (BACTROBAN) 2 % Apply 1 Application topically 2 (two) times daily. 05/24/23   Del Nigel Berthold, FNP  oxybutynin (DITROPAN-XL) 10 MG 24 hr tablet Take 10 mg by mouth daily. 04/12/23   [provider]  Oxycodone HCl 10 MG TABS Take 1 tablet (10 mg total) by mouth 3 (three) times daily as needed. 03/26/23   Love, Evlyn Kanner, PA-C  pantoprazole (PROTONIX) 40 MG tablet Take 1 tablet (40 mg total) by mouth daily. 03/26/23   Love, Evlyn Kanner, PA-C  polyethylene glycol powder (MIRALAX) 17 GM/SCOOP powder Take 17 g by mouth daily as needed for mild constipation. 10/10/23   Del Nigel Berthold, FNP  senna-docusate (SENOKOT-S) 8.6-50 MG tablet Take 2 tablets by mouth daily after supper. 03/26/23   Love, Evlyn Kanner, PA-C  sodium bicarbonate 650 MG tablet Take 1 tablet (650 mg total) by mouth 2 (two) times daily. 03/26/23   Jacquelynn Cree, PA-C                                                                                                                                    Past Surgical History Past Surgical History:  Procedure Laterality Date   ABDOMINAL AORTOGRAM W/LOWER EXTREMITY N/A 11/20/2022   Procedure: ABDOMINAL AORTOGRAM W/LOWER EXTREMITY;  Surgeon: Maeola Harman, MD;  Location: [facility] INVASIVE CV LAB;  Service: Cardiovascular;  Laterality: N/A;   ABDOMINAL AORTOGRAM W/LOWER EXTREMITY N/A 03/05/2023    Procedure: ABDOMINAL AORTOGRAM W/LOWER EXTREMITY;  Surgeon: Maeola Harman, MD;  Location: [facility] INVASIVE CV LAB;  Service: Cardiovascular;  Laterality: N/A;   AMPUTATION Right 03/09/2023   Procedure: AMPUTATION ABOVE KNEE;  Surgeon: Maeola Harman, MD;  Location: [facility] OR;  Service: Vascular;  Laterality: Right;   COLONOSCOPY N/A 08/25/2020  Procedure: COLONOSCOPY;  Surgeon: Corbin Ade, MD;  Location: [facility];  Service: Endoscopy;  Laterality: N/A;  9:30   COLONOSCOPY     CYSTOSCOPY WITH LITHOLAPAXY N/A 05/18/2021   Procedure: CYSTOSCOPY WITH LITHOLAPAXY;  Surgeon: Rene Paci, MD;  Location[facility];  Service: Urology;  Laterality: N/A;  ONLY NEEDS  30 MIN   FEMORAL-TIBIAL BYPASS GRAFT Right 12/12/2022   Procedure: RIGHT COMMON FEMORAL-POSTERIOR TIBIAL ARTERY BYPASS WITH VEIN HARVESTING OF THE GREATER SAPHENOUS VEIN AND FEMORAL ENDARTERECTOMY WITH COMPOSITE GRAFT;  Surgeon: Maeola Harman, MD;  Location: [facility] OR;  Service: Vascular;  Laterality: Right;   POLYPECTOMY  08/25/2020   Procedure: POLYPECTOMY;  Surgeon: Corbin Ade, MD;  Location: [facility];  Service: Endoscopy;;   ROBOT ASSISTED LAPAROSCOPIC RADICAL PROSTATECTOMY  2011   TONSILLECTOMY  age 22   and adenoids removed   Family History Family History  Problem Relation Age of Onset   Gastric cancer Mother    Prostate cancer Father    Breast cancer Neg Hx    Pancreatic cancer Neg Hx     Social History Social History   Tobacco Use   Smoking status: Former    Current packs/day: 0.00    Average packs/day: 0.3 packs/day for 21.0 years (5.3 ttl pk-yrs)    Types: Cigarettes    Start date: 11/29/2001    Quit date: 11/29/2022    Years since quitting: 0.9    Passive exposure: Never   Smokeless tobacco: Never  Vaping Use   Vaping status: Never Used  Substance Use Topics   Alcohol use: Never   Drug use: Never   Allergies Shellfish allergy  Review of  Systems Review of Systems  Skin:  Positive for wound.  Psychiatric/Behavioral:  Positive for confusion.     Physical Exam Vital Signs  I have reviewed the triage vital signs BP (!) 74/48   Pulse (!) 106   Temp (!) 95.2 F (35.1 C) (Rectal)   Resp 18   SpO2 98%   Physical Exam Constitutional:      General: He is not in acute distress.    Appearance: Normal appearance.  HENT:     Head: Normocephalic.     Comments: Right periorbital swelling    Nose: No congestion or rhinorrhea.  Eyes:     General:        Right eye: No discharge.        Left eye: No discharge.     Extraocular Movements: Extraocular movements intact.     Pupils: Pupils are equal, round, and reactive to light.  Cardiovascular:     Rate and Rhythm: Normal rate and regular rhythm.     Heart sounds: No murmur heard. Pulmonary:     Effort: No respiratory distress.     Breath sounds: No wheezing or rales.  Abdominal:     General: There is no distension.     Tenderness: There is no abdominal tenderness.  Genitourinary:    Comments: Erythema to the inguinal folds bilaterally, wound over the scrotum Musculoskeletal:        General: Normal range of motion.     Cervical back: Normal range of motion.  Skin:    General: Skin is warm and dry.  Neurological:     General: No focal deficit present.     Mental Status: He is alert.     ED Results and Treatments Labs (all labs ordered are listed, but only abnormal results are displayed)  Labs Reviewed  COMPREHENSIVE METABOLIC PANEL - Abnormal; Notable for the following components:      Result Value   CO2 21 (*)    Glucose, Bld 165 (*)    BUN 97 (*)    Creatinine, Ser 2.21 (*)    Calcium 12.4 (*)    Albumin 2.6 (*)    AST 149 (*)    ALT 69 (*)    GFR, Estimated 31 (*)    Anion gap 17 (*)    All other components within normal limits  CBC WITH DIFFERENTIAL/PLATELET - Abnormal; Notable for the following components:   WBC 14.4 (*)    RBC 3.49 (*)     Hemoglobin 9.6 (*)    HCT 30.7 (*)    RDW 17.9 (*)    Neutro Abs 12.8 (*)    All other components within normal limits  CK - Abnormal; Notable for the following components:   Total CK 6,948 (*)    All other components within normal limits  LACTIC ACID, PLASMA - Abnormal; Notable for the following components:   Lactic Acid, Venous 2.0 (*)    All other components within normal limits  CULTURE, BLOOD (ROUTINE X 2)  CULTURE, BLOOD (ROUTINE X 2)  TSH  LACTIC ACID, PLASMA  URINALYSIS, ROUTINE W REFLEX MICROSCOPIC                                                                                                                          Radiology CT ABDOMEN PELVIS W CONTRAST  Result Date: 11/26/2023 CLINICAL DATA:  Increasing weakness for 2-3 days with recent fall. Scrotal mass or lump, scrotal wound, concern for abscess versus deep space infection. Evidence of metastatic disease on recent chest CTA. * Tracking Code: BO * EXAM: CT ABDOMEN AND PELVIS WITH CONTRAST TECHNIQUE: Multidetector CT imaging of the abdomen and pelvis was performed using the standard protocol following bolus administration of intravenous contrast. RADIATION DOSE REDUCTION: This exam was performed according to the departmental dose-optimization program which includes automated exposure control, adjustment of the mA and/or kV according to patient size and/or use of iterative reconstruction technique. CONTRAST:  75mL OMNIPAQUE IOHEXOL 300 MG/ML  SOLN COMPARISON:  Noncontrast CT of the abdomen and pelvis 03/05/2019. Chest CTA 11/12/2023. FINDINGS: Lower chest: Mild dependent atelectasis at both lung bases. No significant pleural or pericardial effusion. There is aortic and coronary artery atherosclerosis. Partially imaged mass again noted involving the anterior aspect of the right 5th rib, measuring up to 3.8 x 2.9 cm on image 2/1. Hepatobiliary: The liver is normal in density without suspicious focal abnormality. Subcentimeter  low-density lesion inferiorly in the right hepatic lobe on image 26/1 is unchanged from remote abdominal CT, likely a cyst. No evidence of gallstones, gallbladder wall thickening or biliary dilatation. Pancreas: Unremarkable. No pancreatic ductal dilatation or surrounding inflammatory changes. Spleen: Normal in size without focal abnormality. Adrenals/Urinary Tract: As seen on recent chest CT, new bilateral adrenal masses consistent with metastatic disease. Right  adrenal nodule measures 3.0 x 2.0 cm on image 20/1 and left adrenal nodule measures 2.3 x 2.2 cm on image 24/1. No evidence of urinary tract calculus, suspicious renal lesion or hydronephrosis. There are renal cysts bilaterally which are similar to remote abdominal CT. The bladder appears unremarkable for its degree of distention. Stomach/Bowel: No definite enteric contrast administered. There is high density colonic stool. The stomach appears unremarkable for its degree of distension. No evidence of bowel wall thickening, distention or surrounding inflammatory change. The appendix appears normal. Vascular/Lymphatic: There are no enlarged abdominal or pelvic lymph nodes. Aortic and branch vessel atherosclerosis without evidence of aneurysm or large vessel occlusion in the abdomen or pelvis. Postsurgical changes at the right groin consistent with femoral arterial bypass grafting. There is low-density within the femoral graft suspicious for occlusion. In addition, there is low-density in the right femoral vein which could reflect a DVT. No intrapelvic extension identified. Reproductive: Status post prostatectomy. No recurrent mass lesion identified. Other: As partially imaged on recent chest CT, there are multiple abdominal wall masses consistent with metastatic disease. Representative lesions include a 3.4 cm mass posteriorly in the left mid abdominal wall on image 33/1, a 3.2 x 2.2 cm mass in the left anterior abdominal wall on image 42/1 and a 2.9 cm right  suprapubic mass on image 84/1. No obvious scrotal mass or fluid collection. In addition to the subcutaneous lesions, there are 2 retroperitoneal masses on the right, largest measuring 1.7 cm on image 44/1. There is a supraumbilical hernia containing only fat. No ascites or free air. Musculoskeletal: As above, multiple subcutaneous metastases. In addition, as seen on recent chest CT, there is a large destructive mass involving the left aspect of the T10 vertebral body, measuring 3.5 x 3.0 cm on image 8/1. This mass is associated with intraspinal extension and possible thoracic cord compression, as before. There are other smaller osseous metastases within the L1 and L4 vertebral bodies, the right superior pubic ramus, the left superior acetabulum and the left femoral greater trochanter. IMPRESSION: 1. Findings are consistent with metastatic disease, including multiple subcutaneous metastases, bilateral adrenal masses, right retroperitoneal masses and multiple osseous metastases. 2. The largest osseous metastasis involves the T10 vertebral body with intraspinal extension and possible thoracic cord compression, as on previous chest CTA. Consider thoracic spine MRI for further evaluation. 3. No obvious scrotal mass or fluid collection. 4. Postsurgical changes at the right groin consistent with femoral arterial bypass grafting. Low-density within the femoral graft is suspicious for occlusion. In addition, there is low-density in the right femoral vein which could reflect a DVT or sequela of poor inflow. No intrapelvic extension identified. 5.  Aortic Atherosclerosis (ICD10-I70.0). Electronically Signed   By: Carey Bullocks M.D.   On: 11/26/2023 15:35   CT HEAD WO CONTRAST ( )  Result Date: 11/26/2023 CLINICAL DATA:  Head trauma, minor (Age >= 65y); Facial trauma, blunt; Neck trauma (Age >= 65y) EXAM: CT HEAD WITHOUT CONTRAST CT MAXILLOFACIAL WITHOUT CONTRAST CT CERVICAL SPINE WITHOUT CONTRAST TECHNIQUE:  Multidetector CT imaging of the head, cervical spine, and maxillofacial structures were performed using the standard protocol without intravenous contrast. Multiplanar CT image reconstructions of the cervical spine and maxillofacial structures were also generated. RADIATION DOSE REDUCTION: This exam was performed according to the departmental dose-optimization program which includes automated exposure control, adjustment of the mA and/or kV according to patient size and/or use of iterative reconstruction technique. COMPARISON:  CT Chest 11/12/23 FINDINGS: CT HEAD FINDINGS Brain: No hemorrhage. No hydrocephalus.  No extra-axial fluid collection. No CT evidence of cortical infarct. No mass effect. No mass lesion. Vascular: No hyperdense vessel or unexpected calcification. Skull: Normal. Negative for fracture or focal lesion. Other: Soft tissue hematoma along the left frontal scalp CT MAXILLOFACIAL FINDINGS Osseous: No fracture or mandibular dislocation. No destructive process. Orbits: Negative. No traumatic or inflammatory finding. Sinuses: No middle ear or mastoid effusion. Paranasal sinuses are notable for mucosal thickening left maxillary sinus. Postsurgical changes from prior left maxillary antrostomy. Soft tissues: Soft tissue swelling in the periorbital soft tissues on the left. CT CERVICAL SPINE FINDINGS Alignment: Normal. Skull base and vertebrae: No acute fracture. There is a large lytic lesion involving the T1 spinous process and lamina, worrisome for osseous metastatic disease. Soft tissues and spinal canal: There are multiple enlarged cervical lymph nodes predominantly in the right neck, for example a 2.1 cm right level 3 lymph node (series 5, image 52) and a 2.1 cm right paraesophageal lymph node. Disc levels: There is likely epidural spread of tumor along the dorsal aspect of the spinal canal at the T1 level (series 5, image 63). Upper chest: See separately dictated CT chest abdomen and pelvis for  additional findings Other: No IMPRESSION: 1. No acute intracranial abnormality. 2. No acute facial bone fracture. 3. No acute fracture or traumatic listhesis of the cervical spine. 4. Large lytic lesion involving the T1 spinous process and lamina, worrisome for osseous metastatic disease. There is likely epidural spread of tumor along the dorsal aspect of the spinal canal at the T1 level. This is unchanged from 11/12/23 5. Multiple enlarged cervical lymph nodes, predominantly in the right neck, worrisome for nodal metastatic disease. 6. Soft tissue hematoma along the left frontal scalp. No underlying calvarial fracture. Electronically Signed   By: Lorenza Cambridge M.D.   On: 11/26/2023 13:59   CT CERVICAL SPINE WO CONTRAST  Result Date: 11/26/2023 CLINICAL DATA:  Head trauma, minor (Age >= 65y); Facial trauma, blunt; Neck trauma (Age >= 65y) EXAM: CT HEAD WITHOUT CONTRAST CT MAXILLOFACIAL WITHOUT CONTRAST CT CERVICAL SPINE WITHOUT CONTRAST TECHNIQUE: Multidetector CT imaging of the head, cervical spine, and maxillofacial structures were performed using the standard protocol without intravenous contrast. Multiplanar CT image reconstructions of the cervical spine and maxillofacial structures were also generated. RADIATION DOSE REDUCTION: This exam was performed according to the departmental dose-optimization program which includes automated exposure control, adjustment of the mA and/or kV according to patient size and/or use of iterative reconstruction technique. COMPARISON:  CT Chest 11/12/23 FINDINGS: CT HEAD FINDINGS Brain: No hemorrhage. No hydrocephalus. No extra-axial fluid collection. No CT evidence of cortical infarct. No mass effect. No mass lesion. Vascular: No hyperdense vessel or unexpected calcification. Skull: Normal. Negative for fracture or focal lesion. Other: Soft tissue hematoma along the left frontal scalp CT MAXILLOFACIAL FINDINGS Osseous: No fracture or mandibular dislocation. No destructive  process. Orbits: Negative. No traumatic or inflammatory finding. Sinuses: No middle ear or mastoid effusion. Paranasal sinuses are notable for mucosal thickening left maxillary sinus. Postsurgical changes from prior left maxillary antrostomy. Soft tissues: Soft tissue swelling in the periorbital soft tissues on the left. CT CERVICAL SPINE FINDINGS Alignment: Normal. Skull base and vertebrae: No acute fracture. There is a large lytic lesion involving the T1 spinous process and lamina, worrisome for osseous metastatic disease. Soft tissues and spinal canal: There are multiple enlarged cervical lymph nodes predominantly in the right neck, for example a 2.1 cm right level 3 lymph node (series 5, image 52) and a 2.1  cm right paraesophageal lymph node. Disc levels: There is likely epidural spread of tumor along the dorsal aspect of the spinal canal at the T1 level (series 5, image 63). Upper chest: See separately dictated CT chest abdomen and pelvis for additional findings Other: No IMPRESSION: 1. No acute intracranial abnormality. 2. No acute facial bone fracture. 3. No acute fracture or traumatic listhesis of the cervical spine. 4. Large lytic lesion involving the T1 spinous process and lamina, worrisome for osseous metastatic disease. There is likely epidural spread of tumor along the dorsal aspect of the spinal canal at the T1 level. This is unchanged from 11/12/23 5. Multiple enlarged cervical lymph nodes, predominantly in the right neck, worrisome for nodal metastatic disease. 6. Soft tissue hematoma along the left frontal scalp. No underlying calvarial fracture. Electronically Signed   By: Lorenza Cambridge M.D.   On: 11/26/2023 13:59   CT Maxillofacial Wo Contrast  Result Date: 11/26/2023 CLINICAL DATA:  Head trauma, minor (Age >= 65y); Facial trauma, blunt; Neck trauma (Age >= 65y) EXAM: CT HEAD WITHOUT CONTRAST CT MAXILLOFACIAL WITHOUT CONTRAST CT CERVICAL SPINE WITHOUT CONTRAST TECHNIQUE: Multidetector CT  imaging of the head, cervical spine, and maxillofacial structures were performed using the standard protocol without intravenous contrast. Multiplanar CT image reconstructions of the cervical spine and maxillofacial structures were also generated. RADIATION DOSE REDUCTION: This exam was performed according to the departmental dose-optimization program which includes automated exposure control, adjustment of the mA and/or kV according to patient size and/or use of iterative reconstruction technique. COMPARISON:  CT Chest 11/12/23 FINDINGS: CT HEAD FINDINGS Brain: No hemorrhage. No hydrocephalus. No extra-axial fluid collection. No CT evidence of cortical infarct. No mass effect. No mass lesion. Vascular: No hyperdense vessel or unexpected calcification. Skull: Normal. Negative for fracture or focal lesion. Other: Soft tissue hematoma along the left frontal scalp CT MAXILLOFACIAL FINDINGS Osseous: No fracture or mandibular dislocation. No destructive process. Orbits: Negative. No traumatic or inflammatory finding. Sinuses: No middle ear or mastoid effusion. Paranasal sinuses are notable for mucosal thickening left maxillary sinus. Postsurgical changes from prior left maxillary antrostomy. Soft tissues: Soft tissue swelling in the periorbital soft tissues on the left. CT CERVICAL SPINE FINDINGS Alignment: Normal. Skull base and vertebrae: No acute fracture. There is a large lytic lesion involving the T1 spinous process and lamina, worrisome for osseous metastatic disease. Soft tissues and spinal canal: There are multiple enlarged cervical lymph nodes predominantly in the right neck, for example a 2.1 cm right level 3 lymph node (series 5, image 52) and a 2.1 cm right paraesophageal lymph node. Disc levels: There is likely epidural spread of tumor along the dorsal aspect of the spinal canal at the T1 level (series 5, image 63). Upper chest: See separately dictated CT chest abdomen and pelvis for additional findings  Other: No IMPRESSION: 1. No acute intracranial abnormality. 2. No acute facial bone fracture. 3. No acute fracture or traumatic listhesis of the cervical spine. 4. Large lytic lesion involving the T1 spinous process and lamina, worrisome for osseous metastatic disease. There is likely epidural spread of tumor along the dorsal aspect of the spinal canal at the T1 level. This is unchanged from 11/12/23 5. Multiple enlarged cervical lymph nodes, predominantly in the right neck, worrisome for nodal metastatic disease. 6. Soft tissue hematoma along the left frontal scalp. No underlying calvarial fracture. Electronically Signed   By: Lorenza Cambridge M.D.   On: 11/26/2023 13:59    Pertinent labs & imaging results that were available  during my care of the patient were reviewed by me and considered in my medical decision making (see MDM for details).  Medications Ordered in ED Medications  lactated ringers infusion ( Intravenous New Bag/Given 11/26/23 1508)  vancomycin (VANCOCIN) IVPB 1000 mg/200 mL premix (1,000 mg Intravenous New Bag/Given 11/26/23 1522)  vancomycin (VANCOREADY) IVPB 1500 mg/300 mL (has no administration in time range)  lactated ringers bolus 1,000 mL (has no administration in time range)  0.9 %  sodium chloride infusion (has no administration in time range)  norepinephrine (LEVOPHED) 4mg  in (0.016 mg/mL) premix infusion (has no administration in time range)  lactated ringers bolus 1,000 mL (1,000 mLs Intravenous Bolus 11/26/23 1214)  lactated ringers bolus 1,000 mL (1,000 mLs Intravenous Bolus 11/26/23 1358)  ceFEPIme (MAXIPIME) 2 g in sodium chloride 0.9 % 100 mL IVPB (0 g Intravenous Stopped 11/26/23 1420)  iohexol (OMNIPAQUE) 300 MG/ML solution 75 mL (75 mLs Intravenous Contrast Given 11/26/23 1341)  sodium chloride 0.9 % bolus 1,000 mL (0 mLs Intravenous Stopped 11/26/23 1555)                                                                                                                                      Procedures .Critical Care  Performed by: Glendora Score, MD Authorized by: Glendora Score, MD   Critical care provider statement:    Critical care time (minutes):  30   Critical care was necessary to treat or prevent imminent or life-threatening deterioration of the following conditions:  Sepsis and dehydration   Critical care was time spent personally by me on the following activities:  Development of treatment plan with patient or surrogate, discussions with consultants, evaluation of patient's response to treatment, examination of patient, ordering and review of laboratory studies, ordering and review of radiographic studies, ordering and performing treatments and interventions, pulse oximetry, re-evaluation of patient's condition and review of old charts   (including critical care time)  Medical Decision Making / ED Course   This patient presents to the ED for concern of fall, generalized weakness, scrotal wound, this involves an extensive number of treatment options, and is a complaint that carries with it a high risk of complications and morbidity.  The differential diagnosis includes sepsis, bacteremia, dehydration, electrolyte abnormality, progression of underlying metastatic disease, Fournier's gangrene, rhabdomyolysis, fracture  MDM: Seen emergency room for evaluation of multiple complaints described above.  Physical exam with a wound over the scrotum, intertrigal erythema in the perineum, right-sided periorbital swelling and patient that is somnolent but will answer questions when prompted.  Laboratory valuation with a leukocytosis to 14.4, hemoglobin 9.6, BUN 97, creatinine 2.21 which is an elevation for this patient, albumin 2.6, AST 149, ALT 69, CK significant elevated at 6948, TSH normal.  Blood cultures obtained and broad-spectrum antibiotics started as patient does meet SIRS criteria.  Lactic acid is normal.  Patient received 2 L lactated Ringer's and  started  on maintenance fluids for his rhabdomyolysis.  Blood pressures still soft and on reevaluation patient floridly hypotensive with systolics in the 70s.  Additional liter of lactated Ringer's ordered and patient started on low-dose Levophed.  CT imaging including CT head, max face, CT cervical spine unremarkable for acute traumatic injury.  CT abdomen pelvis showing metastatic disease with multiple subcutaneous metastases, bilateral adrenal masses, right retroperitoneal mass and multiple osseous metastases the largest of which involve the T10 vertebral body with possible thoracic cord compression, possible occluded left femoral arterial bypass grafting but lower extremity exam warm and pulses identified.  Patient require ICU admission for suspected bacteremia as patient persistently hypothermic and hypotensive in the setting of suspected scrotal infection and progressive metastatic disease.  Patient admitted   Additional history obtained: -Additional history obtained from family member -External records from outside source obtained and reviewed including: Chart review including previous notes, labs, imaging, consultation notes   Lab Tests: -I ordered, reviewed, and interpreted labs.   The pertinent results include:   Labs Reviewed  COMPREHENSIVE METABOLIC PANEL - Abnormal; Notable for the following components:      Result Value   CO2 21 (*)    Glucose, Bld 165 (*)    BUN 97 (*)    Creatinine, Ser 2.21 (*)    Calcium 12.4 (*)    Albumin 2.6 (*)    AST 149 (*)    ALT 69 (*)    GFR, Estimated 31 (*)    Anion gap 17 (*)    All other components within normal limits  CBC WITH DIFFERENTIAL/PLATELET - Abnormal; Notable for the following components:   WBC 14.4 (*)    RBC 3.49 (*)    Hemoglobin 9.6 (*)    HCT 30.7 (*)    RDW 17.9 (*)    Neutro Abs 12.8 (*)    All other components within normal limits  CK - Abnormal; Notable for the following components:   Total CK 6,948 (*)    All other  components within normal limits  LACTIC ACID, PLASMA - Abnormal; Notable for the following components:   Lactic Acid, Venous 2.0 (*)    All other components within normal limits  CULTURE, BLOOD (ROUTINE X 2)  CULTURE, BLOOD (ROUTINE X 2)  TSH  LACTIC ACID, PLASMA  URINALYSIS, ROUTINE W REFLEX MICROSCOPIC     Imaging Studies ordered: I ordered imaging studies including CT head, C-spine, max face, CT abdomen pelvis I independently visualized and interpreted imaging. I agree with the radiologist interpretation   Medicines ordered and prescription drug management: Meds ordered this encounter  Medications   lactated ringers bolus 1,000 mL   lactated ringers bolus 1,000 mL   lactated ringers infusion   ceFEPIme (MAXIPIME) 2 g in sodium chloride 0.9 % 100 mL IVPB   vancomycin (VANCOCIN) IVPB 1000 mg/200 mL premix    Order Specific Question:   Indication:    Answer:   Sepsis   vancomycin (VANCOREADY) IVPB 1500 mg/300 mL    Order Specific Question:   Indication:    Answer:   Sepsis   iohexol (OMNIPAQUE) 300 MG/ML solution 75 mL   sodium chloride 0.9 % bolus 1,000 mL   lactated ringers bolus 1,000 mL   0.9 %  sodium chloride infusion   norepinephrine (LEVOPHED) 4mg  in (0.016 mg/mL) premix infusion    Order Specific Question:   IV Access    Answer:   Peripheral    -I have reviewed the patients home medicines and  have made adjustments as needed  Critical interventions Broad-spectrum antibiotics, fluid resuscitation, peripheral pressors  Consultations Obtained: I requested consultation with the intensivist on-call,  and discussed lab and imaging findings as well as pertinent plan - they recommend: Hospitalist consultation and if they are unable to care for the patient at [facility] to call him back   Cardiac Monitoring: The patient was maintained on a cardiac monitor.  I personally viewed and interpreted the cardiac monitored which showed an underlying rhythm of: Sinus  tachycardia  Social Determinants of Health:  Factors impacting patients care include: none   Reevaluation: After the interventions noted above, I reevaluated the patient and found that they have :stayed the same  Co morbidities that complicate the patient evaluation  Past Medical History:  Diagnosis Date   Family history of metabolic acidosis with increased anion gap 03/05/2023   GERD (gastroesophageal reflux disease)    History of kidney stones    Hyperlipidemia    Hypertension    Prostate cancer [facility]) 2011   radiation june 2020   Umbilical hernia    Wears dentures    full   Wears glasses    Wears partial dentures    lower      Dispostion: I considered admission for this patient, and patient require hospital admission for suspected bacteremia and persistent hypotension with rhabdomyolysis     Final Clinical Impression(s) / ED Diagnoses Final diagnoses:  Non-traumatic rhabdomyolysis  Wound cellulitis     @PCDICTATION @    Glendora Score, MD 11/26/23 1606

## 2023-11-26 NOTE — ED Notes (Signed)
Patient transported to CT 

## 2023-11-26 NOTE — ED Notes (Signed)
Patient back from CT.

## 2023-11-26 NOTE — Progress Notes (Signed)
Pharmacy Antibiotic Note  [name] is a 72 y.o. male admitted on 11/26/2023 with scrotal infection Pharmacy has been consulted for vancomycin and Zosyn dosing.  Plan: Continue Vancomycin 1500 mg IV every 48 hours (eAUC 519, SCr used 2.2, Vd coeff 0.5) Zosyn 3.375gm IV q8h - each dose over 4 hours Monitor labs, c/s, and vanco levels as indicated.     Temp (24hrs), Avg:96 F (35.6 C), Min:95.2 F (35.1 C), Max:97.6 F (36.4 C)  Recent Labs  Lab 11/26/23 1033 11/26/23 1220 11/26/23 1406  WBC 14.4*  --   --   CREATININE 2.21*  --   --   LATICACIDVEN  --  2.0* 1.6    CrCl cannot be calculated (Unknown ideal weight.).    Allergies  Allergen Reactions   Shellfish Allergy Swelling    Antimicrobials this admission: Vanco 12/2 >> Cefepime 12/2 x1 Zosyn 12/2 >>  Microbiology results: 12/2 BCx:    Thank you for allowing pharmacy to be a part of this patient's care.  Christoper Fabian, PharmD, BCPS Please see amion for complete clinical pharmacist phone list 11/26/2023 6:45 PM

## 2023-11-26 NOTE — ED Triage Notes (Signed)
Pt BIB [facility] from home with reports of fall. Pt reports increased weakness x 2-3 days. Pt has swelling to the left eye. Pt reports hitting head when he fell.

## 2023-11-26 NOTE — ED Provider Notes (Signed)
Patient is really on 10 mics of Levophed and his blood pressure is 98/50.  I spoke with the hospitalist Dr.Ghimire and he does not feel like the patient should be admitted to [facility] under the hospitalist service.  I called back the critical care doctor, Dr. Merrily Pew and he accepted the patient to be transferred to [facility], ICU   Bethann Berkshire, MD 11/26/23 [phone]

## 2023-11-26 NOTE — ED Notes (Signed)
Bair hugger applied.

## 2023-11-26 NOTE — ED Notes (Signed)
Checked pts rectal temp 95.2

## 2023-11-26 NOTE — H&P (Signed)
NAME:  [name], MRN:  [id number], DOB:  1951/05/31, LOS: 0 ADMISSION DATE:  11/26/2023 CONSULTATION DATE:  11/26/2023 REFERRING MD:  Estell Harpin - EDP CHIEF COMPLAINT:  Septic shock, rhabdomyolysis   History of Present Illness:  72 year old man who presented to [facility] 12/2 as a transfer from [facility] for septic shock and rhabdomyolysis. PMHx significant for HTN, HLD, PAD with critical limb ischemia s/p R AKA 02/2023 ([facility])CKD stage IIIa, nephrolithiasis, GERD, prostate CA (s/p prostatectomy/XRT 2020). Recent ED presentation to [facility] 11/13/2023 for SOB, anemia (CTA Chest negative for PE).  Patient reported fall at home with increased weakness x 2-3 days, hit L side of his head upon falling. Last seen by family 48H prior to presentation. On date of admission found on the ground in his bedroom; per family patient is significantly weaker than usual and altered compared to his baseline mental status. On ED arrival, patient was hypothermic, tachycardic to 106, hypotensive to 74/48, RR 18, SpO2 98% on RA. Labs were notable for WBC 14.4, Hgb 9.6 (baseline), Plt 313. Na 139, K 4.0, CO2 21, BUN 97/Cr 2.21 (baseline Cr 1.3-1.5). Mildly elevated transaminases 149/69, Alk Phos/Tbili WNL. TSH 2.4. LA 2.0, improved to 1.6 with rehydration. CK 6948 c/w rhabdomyolysis. CT Head/C-Spine/Maxillofacial NAICA without acute facial bone fx or acute fracture/listhesis of C-spine, soft tissue hematoma of L frontal scalp. CT A/P with findings c/w metastatic disease (multiple subQ masses, bilateral adrenal masses, R RP masses and multiple osseous metastases) with largest osseous metastasis of T10 vertebral body with cord compression, no scrotal mass or fluid collection. Peripheral Levophed was initiated and cefepime x 1 given, broadened to Zosyn/Vanc pending Cx data.  PCCM consulted for ICU admission and transfer to [facility].  Pertinent Medical History:   Past Medical History:  Diagnosis Date   Family history of metabolic acidosis  with increased anion gap 03/05/2023   GERD (gastroesophageal reflux disease)    History of kidney stones    Hx of AKA (above knee amputation), right (HCC)    Hyperlipidemia    Hypertension    Metastatic disease (HCC)    PAD (peripheral artery disease) (HCC)    S/p R CFA and PTA bypass 11/2022, critical limb ischemia s/p R AKA 02/2023   Prostate cancer [facility]) 2011   radiation june 2020   Umbilical hernia    Wears dentures    full   Wears glasses    Wears partial dentures    lower   Significant Hospital Events: Including procedures, antibiotic start and stop dates in addition to other pertinent events   12/2 - Presented to [facility] after being found down at home s/p fall. Presumed sepsis with shock c/b rhabdomyolysis. PCCM consulted for ICU admission. Transferred to [facility] for further care.  Interim History / Subjective:  PCCM consulted for ICU admission and transfer to [facility].    Objective:  Blood pressure (!) 107/57, pulse (!) 113, temperature 98.6 F (37 C), temperature source Oral, resp. rate 14, weight 91.4 kg, SpO2 99%.       No intake or output data in the 24 hours ending 11/26/23 2014 Filed Weights   11/26/23 1849 11/26/23 1922  Weight: 89.3 kg 91.4 kg   Physical Examination: General: Chronically ill-appearing elderly man in NAD. Resting in bed. HEENT: [place]/AT, anicteric sclera, PERRL 3mm, dry mucous membranes. Neuro: Awake, oriented x 3-4. Responds to verbal stimuli. Following commands consistently. Generalized weakness, but able to move all extremities.  CV: RRR, no m/g/r. PULM: Breathing even and unlabored  on RA. Lung fields CTAB, diminished at bases bilaterally. GI: Soft, mildly distended, +generalized TTP throughout. Palpable area of subcutaneous metastasis just L of midline umbilicus. +Umbilical hernia, small/reducible. Faint erythema noted to bilateral lower quadrants, L > R. Hypoactive bowel sounds. Extremities: Significant 2+ pitting edema noted to BLE, s/p R AKA with edema  of thigh, LLE with edema thigh to foot. Skin: Warm/dry, erythema of lower abdominal wall as described above, multiple foul-smelling superficial wounds of groin/scrotum, appear moisture/pressure associated (POA).  Resolved Hospital Problem List:    Assessment & Plan:  Undifferentiated shock, presumed septic Found down at home, s/p fall CT Head/C-Spine/Maxillofacial NAICA without acute facial bone fx or acute fracture/listhesis of C-spine, soft tissue hematoma of L frontal scalp. CT A/P with findings c/w metastatic disease (multiple subQ masses, bilateral adrenal masses, R RP masses and multiple osseous metastases) with largest osseous metastasis of T10 vertebral body with cord compression, no scrotal mass or fluid collection. - Admit to ICU - Goal MAP > 60, SBP > 90 - Fluid resuscitation as tolerated - Peripheral Levophed titrated to goal MAP - Trend WBC, fever curve - F/u Cx data - Continue broad-spectrum antibiotics  Rhabdomyolysis AKI on CKD stage IIIa in the setting of rhabdomyolysis - Trend BMP, CK - Replete electrolytes as indicated - Monitor I&Os - F/u urine studies - Avoid nephrotoxic agents as able - Ensure adequate renal perfusion  HTN HLD PAD with history of critical limb ischemia resulting in R AKA - Hold home antihypertensives in the setting of hypotension/shock - Continue ASA - Resume statin as LFTs normalize - F/u LE Venous/Arterial studies - F/u Echo  Scrotal wounds, ?moisture/pressure associated, POA CT A/P 12/2 without evidence of scrotal mass or fluid collection. - WOCN consult - Foley catheter placement to prevent further moisture-associated skin breakdown  Normocytic anemia, likely anemia of chronic disease - Trend H&H - Monitor for signs of active bleeding - Transfuse for Hgb < 7.0 or hemodynamically significant bleeding  GERD - PPI  Mild transaminitis Presume 2/2 hypotension/hypoperfusion. - Trend LFTs to normal - Hold statin for  now  Phantom limb pain - Resume gabapentin as clinically appropriate - Hold home Flexeril for now in the setting of hypotension - Previous consideration of Cymbalta addition if gabapentin helpful, has not yet been added  Prostate CA Metastatic disease of cervical LN, subcutaneous tissue, bilateral adrenal glands, spine/bone CT C-Spine demonstrated large lytic lesion of T1 spinous process/lamina c/f osseous metastatic disease with likely epidural spread of tumor (unchanged from 11/18) and multiple enlarged cervical LN c/f nodal mets. CT A/P with findings c/w metastatic disease (multiple subQ masses, bilateral adrenal masses, R RP masses and multiple osseous metastases) with largest osseous metastasis of T10 vertebral body with cord compression, no scrotal mass or fluid collection.Presume prostatic primary but there is no discussion of this in recent Heme/Onc notes. Follows with Dr. Ellin Saba, f/u appointment 12/12 with plan to discuss prior CT results and consider BMBx. - Oncology consult, ultimately patient needs GOC discussions - PMT consult  Best Practice: (right click and "Reselect all SmartList Selections" daily)   Diet/type: NPO for now, ADAT as hemodynamics improve DVT prophylaxis: SCDs, SQH GI prophylaxis: PPI Lines: N/A Foley:  Yes, and it is still needed (ordered) Code Status:  full code Last date of multidisciplinary goals of care discussion [12/2 - Confirmed patient's wishes for full code status on admission. Given critical illness in the setting of advanced metastatic cancer, GOC discussions would be beneficial for patient.]  Labs:  CBC:  Recent Labs  Lab 11/26/23 1033  WBC 14.4*  NEUTROABS 12.8*  HGB 9.6*  HCT 30.7*  MCV 88.0  PLT 313   Basic Metabolic Panel: Recent Labs  Lab 11/26/23 1033  NA 139  K 4.0  CL 101  CO2 21*  GLUCOSE 165*  BUN 97*  CREATININE 2.21*  CALCIUM 12.4*   GFR: Estimated Creatinine Clearance: 33.9 mL/min (A) (by C-G formula based on  SCr of 2.21 mg/dL (H)). Recent Labs  Lab 11/26/23 1033 11/26/23 1220 11/26/23 1406  WBC 14.4*  --   --   LATICACIDVEN  --  2.0* 1.6   Liver Function Tests: Recent Labs  Lab 11/26/23 1033  AST 149*  ALT 69*  ALKPHOS 104  BILITOT 1.1  PROT 8.0  ALBUMIN 2.6*   No results for input(s): "LIPASE", "AMYLASE" in the last 168 hours. No results for input(s): "AMMONIA" in the last 168 hours.  ABG:    Component Value Date/Time   PHART 7.368 12/12/2022 1326   PCO2ART 39.4 12/12/2022 1326   PO2ART 146 (H) 12/12/2022 1326   HCO3 23.0 12/12/2022 1326   TCO2 18 (L) 03/05/2023 1031   ACIDBASEDEF 3.0 (H) 12/12/2022 1326   O2SAT 99 12/12/2022 1326    Coagulation Profile: No results for input(s): "INR", "PROTIME" in the last 168 hours.  Cardiac Enzymes: Recent Labs  Lab 11/26/23 1033  CKTOTAL 6,948*   HbA1C: Hgb A1c MFr Bld  Date/Time Value Ref Range Status  07/10/2023 10:03 AM 5.2 4.8 - 5.6 % Final    Comment:             Prediabetes: 5.7 - 6.4          Diabetes: >6.4          Glycemic control for adults with diabetes: <7.0   03/19/2023 10:22 AM 5.7 (H) 4.8 - 5.6 % Final    Comment:    (NOTE)         Prediabetes: 5.7 - 6.4         Diabetes: >6.4         Glycemic control for adults with diabetes: <7.0    CBG: Recent Labs  Lab 11/26/23 1842 11/26/23 1941  GLUCAP 137* 137*   Review of Systems:   Review of Systems  Constitutional:  Positive for chills, malaise/fatigue and weight loss. Negative for fever.  HENT:  Negative for congestion and sore throat.   Respiratory:  Negative for cough, hemoptysis and shortness of breath.   Cardiovascular:  Positive for leg swelling. Negative for chest pain.  Gastrointestinal:  Positive for abdominal pain. Negative for constipation, diarrhea, nausea and vomiting.  Genitourinary:  Negative for dysuria.  Musculoskeletal:  Positive for falls and joint pain.  Skin:  Positive for rash.  Neurological:  Positive for dizziness and  weakness. Negative for headaches.   Past Medical History:  He,  has a past medical history of Family history of metabolic acidosis with increased anion gap (03/05/2023), GERD (gastroesophageal reflux disease), History of kidney stones, AKA (above knee amputation), right (HCC), Hyperlipidemia, Hypertension, Metastatic disease (HCC), PAD (peripheral artery disease) (HCC), Prostate cancer (HCC) (2011), Umbilical hernia, Wears dentures, Wears glasses, and Wears partial dentures.   Surgical History:   Past Surgical History:  Procedure Laterality Date   ABDOMINAL AORTOGRAM W/LOWER EXTREMITY N/A 11/20/2022   Procedure: ABDOMINAL AORTOGRAM W/LOWER EXTREMITY;  Surgeon: Maeola Harman, MD;  [facility] LAB;  Service: Cardiovascular;  Laterality: N/A;   ABDOMINAL AORTOGRAM W/LOWER EXTREMITY N/A 03/05/2023  Procedure: ABDOMINAL AORTOGRAM W/LOWER EXTREMITY;  Surgeon: Maeola Harman, MD;  Location: [facility] INVASIVE CV LAB;  Service: Cardiovascular;  Laterality: N/A;   AMPUTATION Right 03/09/2023   Procedure: AMPUTATION ABOVE KNEE;  Surgeon: Maeola Harman, MD;  Location: [facility] OR;  Service: Vascular;  Laterality: Right;   COLONOSCOPY N/A 08/25/2020   Procedure: COLONOSCOPY;  Surgeon: Corbin Ade, MD;  Location: [facility];  Service: Endoscopy;  Laterality: N/A;  9:30   COLONOSCOPY     CYSTOSCOPY WITH LITHOLAPAXY N/A 05/18/2021   Procedure: CYSTOSCOPY WITH LITHOLAPAXY;  Surgeon: Rene Paci, MD;  Location: [facility];  Service: Urology;  Laterality: N/A;  ONLY NEEDS  30 MIN   FEMORAL-TIBIAL BYPASS GRAFT Right 12/12/2022   Procedure: RIGHT COMMON FEMORAL-POSTERIOR TIBIAL ARTERY BYPASS WITH VEIN HARVESTING OF THE GREATER SAPHENOUS VEIN AND FEMORAL ENDARTERECTOMY WITH COMPOSITE GRAFT;  Surgeon: Maeola Harman, MD;  Location: [facility] OR;  Service: Vascular;  Laterality: Right;   POLYPECTOMY  08/25/2020   Procedure: POLYPECTOMY;  Surgeon:  Corbin Ade, MD;  Location: [facility];  Service: Endoscopy;;   ROBOT ASSISTED LAPAROSCOPIC RADICAL PROSTATECTOMY  2011   TONSILLECTOMY  age 40   and adenoids removed    Social History:   reports that he quit smoking about a year ago. His smoking use included cigarettes. He started smoking about 22 years ago. He has a 5.3 pack-year smoking history. He has never been exposed to tobacco smoke. He has never used smokeless tobacco. He reports that he does not drink alcohol and does not use drugs.   Family History:  His family history includes Gastric cancer in his mother; Prostate cancer in his father. There is no history of Breast cancer or Pancreatic cancer.   Allergies: Allergies  Allergen Reactions   Shellfish Allergy Swelling    Home Medications: Prior to Admission medications   Medication Sig Start Date End Date Taking? Authorizing Provider  acetaminophen (TYLENOL) 325 MG tablet Take 1-2 tablets (325-650 mg total) by mouth every 4 (four) hours as needed for mild pain. 03/26/23   Love, Evlyn Kanner, PA-C  albuterol (VENTOLIN HFA) 108 (90 Base) MCG/ACT inhaler Inhale 2 puffs into the lungs every 6 (six) hours as needed for wheezing or shortness of breath. 10/19/23   Del Nigel Berthold, FNP  aspirin EC 81 MG tablet Take 81 mg by mouth daily.    [provider]  atorvastatin (LIPITOR) 40 MG tablet Take 1 tablet (40 mg total) by mouth every morning. 04/05/23   Maeola Harman, MD  benzonatate (TESSALON) 200 MG capsule Take 1 capsule (200 mg total) by mouth 2 (two) times daily as needed for cough. 10/10/23   Del Nigel Berthold, FNP  cyclobenzaprine (FLEXERIL) 5 MG tablet Take 1 tablet (5 mg total) by mouth 3 (three) times daily as needed for muscle spasms. 04/25/23   Lovorn, Aundra Millet, MD  gabapentin (NEURONTIN) 100 MG capsule Take 2 capsules (200 mg total) by mouth 2 (two) times daily. Along with 300 mg nightly- for phantom pain- due to R AKA 11/16/23   Lovorn, Aundra Millet,  MD  gabapentin (NEURONTIN) 300 MG capsule Take 1 capsule by mouth at bedtime 10/30/23   Lovorn, Aundra Millet, MD  iron polysaccharides (NIFEREX) 150 MG capsule Take 1 capsule (150 mg total) by mouth daily. 10/19/23   Del Nigel Berthold, FNP  levocetirizine (XYZAL) 5 MG tablet Take 1 tablet (5 mg total) by mouth every evening. 05/24/23   Del Newman Nip,  Tenna Child, FNP  lisinopril-hydrochlorothiazide (ZESTORETIC) 10-12.5 MG tablet Take 1 tablet by mouth daily. 10/10/23   Del Nigel Berthold, FNP  metoprolol tartrate (LOPRESSOR) 25 MG tablet Take 0.5 tablets (12.5 mg total) by mouth 2 (two) times daily. Will give 1 month supply- and get from NP/PCP in future. 10/10/23   Del Nigel Berthold, FNP  Multiple Vitamins-Minerals (CENTRUM SILVER PO) Take 1 tablet by mouth daily.     [provider]  mupirocin ointment (BACTROBAN) 2 % Apply 1 Application topically 2 (two) times daily. 05/24/23   Del Nigel Berthold, FNP  oxybutynin (DITROPAN-XL) 10 MG 24 hr tablet Take 10 mg by mouth daily. 04/12/23   [provider]  Oxycodone HCl 10 MG TABS Take 1 tablet (10 mg total) by mouth 3 (three) times daily as needed. 03/26/23   Love, Evlyn Kanner, PA-C  pantoprazole (PROTONIX) 40 MG tablet Take 1 tablet (40 mg total) by mouth daily. 03/26/23   Love, Evlyn Kanner, PA-C  polyethylene glycol powder (MIRALAX) 17 GM/SCOOP powder Take 17 g by mouth daily as needed for mild constipation. 10/10/23   Del Nigel Berthold, FNP  senna-docusate (SENOKOT-S) 8.6-50 MG tablet Take 2 tablets by mouth daily after supper. 03/26/23   Love, Evlyn Kanner, PA-C  sodium bicarbonate 650 MG tablet Take 1 tablet (650 mg total) by mouth 2 (two) times daily. 03/26/23   Jacquelynn Cree, PA-C    Critical care time:   The patient is critically ill with multiple organ system failure and requires high complexity decision making for assessment and support, frequent evaluation and titration of therapies, advanced monitoring, review of  radiographic studies and interpretation of complex data.   Critical Care Time devoted to patient care services, exclusive of separately billable procedures, described in this note is 42 minutes.  Tim Lair, PA-C [facility] 11/26/23 8:14 PM  Please see [email or web address] for pager details.  From 7A-7P if no response, please call [phone] After hours, please call ELink [phone]

## 2023-11-27 ENCOUNTER — Inpatient Hospital Stay (HOSPITAL_COMMUNITY): Payer: Medicare Other

## 2023-11-27 ENCOUNTER — Encounter (HOSPITAL_COMMUNITY): Payer: Self-pay | Admitting: Internal Medicine

## 2023-11-27 ENCOUNTER — Inpatient Hospital Stay: Payer: Self-pay | Admitting: Family Medicine

## 2023-11-27 DIAGNOSIS — L8990 Pressure ulcer of unspecified site, unspecified stage: Secondary | ICD-10-CM | POA: Insufficient documentation

## 2023-11-27 DIAGNOSIS — M6282 Rhabdomyolysis: Secondary | ICD-10-CM | POA: Insufficient documentation

## 2023-11-27 DIAGNOSIS — I82409 Acute embolism and thrombosis of unspecified deep veins of unspecified lower extremity: Secondary | ICD-10-CM | POA: Insufficient documentation

## 2023-11-27 DIAGNOSIS — R6521 Severe sepsis with septic shock: Secondary | ICD-10-CM | POA: Diagnosis not present

## 2023-11-27 DIAGNOSIS — N179 Acute kidney failure, unspecified: Secondary | ICD-10-CM | POA: Insufficient documentation

## 2023-11-27 DIAGNOSIS — M7989 Other specified soft tissue disorders: Secondary | ICD-10-CM | POA: Diagnosis not present

## 2023-11-27 DIAGNOSIS — K219 Gastro-esophageal reflux disease without esophagitis: Secondary | ICD-10-CM | POA: Insufficient documentation

## 2023-11-27 DIAGNOSIS — R578 Other shock: Secondary | ICD-10-CM

## 2023-11-27 DIAGNOSIS — A419 Sepsis, unspecified organism: Secondary | ICD-10-CM | POA: Diagnosis not present

## 2023-11-27 DIAGNOSIS — N1831 Chronic kidney disease, stage 3a: Secondary | ICD-10-CM | POA: Diagnosis not present

## 2023-11-27 DIAGNOSIS — R7401 Elevation of levels of liver transaminase levels: Secondary | ICD-10-CM | POA: Insufficient documentation

## 2023-11-27 DIAGNOSIS — R571 Hypovolemic shock: Secondary | ICD-10-CM | POA: Insufficient documentation

## 2023-11-27 LAB — CBC
HCT: 22.4 % — ABNORMAL LOW (ref 39.0–52.0)
Hemoglobin: 7.1 g/dL — ABNORMAL LOW (ref 13.0–17.0)
MCH: 26.8 pg (ref 26.0–34.0)
MCHC: 31.7 g/dL (ref 30.0–36.0)
MCV: 84.5 fL (ref 80.0–100.0)
Platelets: 199 10*3/uL (ref 150–400)
RBC: 2.65 MIL/uL — ABNORMAL LOW (ref 4.22–5.81)
RDW: 17.8 % — ABNORMAL HIGH (ref 11.5–15.5)
WBC: 11.7 10*3/uL — ABNORMAL HIGH (ref 4.0–10.5)
nRBC: 0 % (ref 0.0–0.2)

## 2023-11-27 LAB — HEPARIN LEVEL (UNFRACTIONATED): Heparin Unfractionated: 0.61 [IU]/mL (ref 0.30–0.70)

## 2023-11-27 LAB — COMPREHENSIVE METABOLIC PANEL
ALT: 65 U/L — ABNORMAL HIGH (ref 0–44)
AST: 119 U/L — ABNORMAL HIGH (ref 15–41)
Albumin: 1.6 g/dL — ABNORMAL LOW (ref 3.5–5.0)
Alkaline Phosphatase: 77 U/L (ref 38–126)
Anion gap: 9 (ref 5–15)
BUN: 60 mg/dL — ABNORMAL HIGH (ref 8–23)
CO2: 26 mmol/L (ref 22–32)
Calcium: 10.5 mg/dL — ABNORMAL HIGH (ref 8.9–10.3)
Chloride: 105 mmol/L (ref 98–111)
Creatinine, Ser: 1.38 mg/dL — ABNORMAL HIGH (ref 0.61–1.24)
GFR, Estimated: 54 mL/min — ABNORMAL LOW (ref >60)
Glucose, Bld: 160 mg/dL — ABNORMAL HIGH (ref 70–99)
Potassium: 3.7 mmol/L (ref 3.5–5.1)
Sodium: 140 mmol/L (ref 135–145)
Total Bilirubin: 0.8 mg/dL (ref <1.2)
Total Protein: 5.3 g/dL — ABNORMAL LOW (ref 6.5–8.1)

## 2023-11-27 LAB — ECHOCARDIOGRAM COMPLETE
AR max vel: 2.33 cm2
AV Area VTI: 2.41 cm2
AV Area mean vel: 2.38 cm2
AV Mean grad: 5 mm[Hg]
AV Peak grad: 9.1 mm[Hg]
AV Vena cont: 0.5 cm
Ao pk vel: 1.51 m/s
Area-P 1/2: 4.63 cm2
P 1/2 time: 360 ms
S' Lateral: 2.6 cm
Weight: 3224.01 [oz_av]

## 2023-11-27 LAB — GLUCOSE, CAPILLARY
Glucose-Capillary: 120 mg/dL — ABNORMAL HIGH (ref 70–99)
Glucose-Capillary: 124 mg/dL — ABNORMAL HIGH (ref 70–99)
Glucose-Capillary: 131 mg/dL — ABNORMAL HIGH (ref 70–99)
Glucose-Capillary: 131 mg/dL — ABNORMAL HIGH (ref 70–99)
Glucose-Capillary: 144 mg/dL — ABNORMAL HIGH (ref 70–99)
Glucose-Capillary: 159 mg/dL — ABNORMAL HIGH (ref 70–99)

## 2023-11-27 LAB — CK: Total CK: 3076 U/L — ABNORMAL HIGH (ref 49–397)

## 2023-11-27 LAB — MRSA NEXT GEN BY PCR, NASAL: MRSA by PCR Next Gen: NOT DETECTED

## 2023-11-27 LAB — PHOSPHORUS: Phosphorus: 3.2 mg/dL (ref 2.5–4.6)

## 2023-11-27 LAB — PSA: Prostatic Specific Antigen: <0.01 ng/mL (ref 0.00–4.00)

## 2023-11-27 LAB — MAGNESIUM: Magnesium: 2 mg/dL (ref 1.7–2.4)

## 2023-11-27 MED ORDER — HEPARIN BOLUS VIA INFUSION
2500.0000 [IU] | Freq: Once | INTRAVENOUS | Status: AC
  Administered 2023-11-27: 2500 [IU] via INTRAVENOUS
  Filled 2023-11-27: qty 2500

## 2023-11-27 MED ORDER — ORAL CARE MOUTH RINSE: 15.0000 mL | OROMUCOSAL | Status: DC | PRN

## 2023-11-27 MED ORDER — CHLORHEXIDINE GLUCONATE CLOTH 2 % EX PADS
6.0000 | MEDICATED_PAD | Freq: Every day | CUTANEOUS | Status: DC
  Administered 2023-11-27 – 2023-12-09 (×13): 6 via TOPICAL

## 2023-11-27 MED ORDER — OXYCODONE HCL 5 MG PO TABS
5.0000 mg | ORAL_TABLET | Freq: Four times a day (QID) | ORAL | Status: DC | PRN
  Administered 2023-11-28 – 2023-12-11 (×9): 5 mg via ORAL
  Filled 2023-11-27 (×10): qty 1

## 2023-11-27 MED ORDER — HEPARIN (PORCINE) 25000 UT/250ML-% IV SOLN
1850.0000 [IU]/h | INTRAVENOUS | Status: AC
  Administered 2023-11-27 – 2023-11-28 (×3): 1500 [IU]/h via INTRAVENOUS
  Administered 2023-11-29 – 2023-11-30 (×2): 1550 [IU]/h via INTRAVENOUS
  Administered 2023-11-30: 1850 [IU]/h via INTRAVENOUS
  Filled 2023-11-27 (×6): qty 250

## 2023-11-27 MED ORDER — LORAZEPAM 0.5 MG PO TABS
0.5000 mg | ORAL_TABLET | Freq: Once | ORAL | Status: AC | PRN
  Administered 2023-11-28: 0.5 mg via ORAL
  Filled 2023-11-27: qty 1

## 2023-11-27 MED ORDER — OXYCODONE HCL 5 MG PO TABS: 2.5000 mg | ORAL_TABLET | Freq: Four times a day (QID) | ORAL | Status: DC | PRN

## 2023-11-27 NOTE — Plan of Care (Signed)
  Problem: Clinical Measurements: Goal: Respiratory complications will improve Outcome: Progressing   Problem: Clinical Measurements: Goal: Cardiovascular complication will be avoided Outcome: Progressing   Problem: Pain Management: Goal: General experience of comfort will improve Outcome: Progressing

## 2023-11-27 NOTE — Progress Notes (Signed)
NAME:  [name], MRN:  [id number], DOB:  1951-03-22, LOS: 1 ADMISSION DATE:  11/26/2023, CONSULTATION DATE:  12/02 REFERRING MD:  EDP CHIEF COMPLAINT:  Fall/AMS   History of Present Illness:  72 year old man who presented to [facility] 12/2 as a transfer from [facility] for septic shock and rhabdomyolysis. PMHx significant for HTN, HLD, PAD with critical limb ischemia s/p R AKA 02/2023 ([facility])CKD stage IIIa, nephrolithiasis, GERD, prostate CA (s/p prostatectomy/XRT 2020). Recent ED presentation to [facility] 11/13/2023 for SOB, anemia (CTA Chest negative for PE).   Patient reported fall at home with increased weakness x 2-3 days, hit L side of his head upon falling. Last seen by family 48H prior to presentation. On date of admission found on the ground in his bedroom; per family patient is significantly weaker than usual and altered compared to his baseline mental status. On ED arrival, patient was hypothermic, tachycardic to 106, hypotensive to 74/48, RR 18, SpO2 98% on RA. Labs were notable for WBC 14.4, Hgb 9.6 (baseline), Plt 313. Na 139, K 4.0, CO2 21, BUN 97/Cr 2.21 (baseline Cr 1.3-1.5). Mildly elevated transaminases 149/69, Alk Phos/Tbili WNL. TSH 2.4. LA 2.0, improved to 1.6 with rehydration. CK 6948 c/w rhabdomyolysis. CT Head/C-Spine/Maxillofacial NAICA without acute facial bone fx or acute fracture/listhesis of C-spine, soft tissue hematoma of L frontal scalp. CT A/P with findings c/w metastatic disease (multiple subQ masses, bilateral adrenal masses, R RP masses and multiple osseous metastases) with largest osseous metastasis of T10 vertebral body with cord compression, no scrotal mass or fluid collection. Peripheral Levophed was initiated and cefepime x 1 given, broadened to Zosyn/Vanc pending Cx data.   PCCM consulted for ICU admission and transfer to [facility]. Pertinent  Medical History  HTN HLD c/b PAD and right AKA CKD IIIa Nephrolithiasis GERD Prostate Cancer s/p prostatectomy  2020  Significant Hospital Events: Including procedures, antibiotic start and stop dates in addition to other pertinent events   12/02: Admitted to [facility] for septic shock c/b rhabdomyolysis.  Interim History / Subjective:  States doing well. No acute concerns. Feels weak.   Objective   Blood pressure (!) 111/48, pulse 94, temperature 98.5 F (36.9 C), temperature source Axillary, resp. rate 15, height 5\' 10"  (1.778 m), weight 91.4 kg, SpO2 100%.        Intake/Output Summary (Last 24 hours) at 11/27/2023 1332 Last data filed at 11/27/2023 1000 Gross per 24 hour  Intake 3555.11 ml  Output 925 ml  Net 2630.11 ml   Filed Weights   11/26/23 1922 11/27/23 0500 11/27/23 1135  Weight: 91.4 kg 91.4 kg 91.4 kg    Examination: General: NCAT, tired appearing HENT: NCAT Lungs: CTAB Cardiovascular: NSR Abdomen: No TTP, normal bowel sounds Extremities: right AKA, no lower extremity edema Neuro: alert and oriented, delay in response time. Some inconsistencies in his given hx.  GU: foley present  Labs   CBC: Recent Labs  Lab 11/26/23 1033 11/27/23 0341  WBC 14.4* 11.7*  NEUTROABS 12.8*  --   HGB 9.6* 7.1*  HCT 30.7* 22.4*  MCV 88.0 84.5  PLT 313 199   Basic Metabolic Panel: Recent Labs  Lab 11/26/23 1033 11/27/23 0341 11/27/23 0859  NA 139  --  140  K 4.0  --  3.7  CL 101  --  105  CO2 21*  --  26  GLUCOSE 165*  --  160*  BUN 97*  --  60*  CREATININE 2.21*  --  1.38*  CALCIUM 12.4*  --  10.5*  MG  --  2.0  --   PHOS  --  3.2  --    GFR: Estimated Creatinine Clearance: 54.2 mL/min (A) (by C-G formula based on SCr of 1.38 mg/dL (H)). Recent Labs  Lab 11/26/23 1033 11/26/23 1220 11/26/23 1406 11/27/23 0341  WBC 14.4*  --   --  11.7*  LATICACIDVEN  --  2.0* 1.6  --    Liver Function Tests: Recent Labs  Lab 11/26/23 1033 11/27/23 0859  AST 149* 119*  ALT 69* 65*  ALKPHOS 104 77  BILITOT 1.1 0.8  PROT 8.0 5.3*  ALBUMIN 2.6* 1.6*   No results for  input(s): "LIPASE", "AMYLASE" in the last 168 hours. No results for input(s): "AMMONIA" in the last 168 hours. ABG    Component Value Date/Time   PHART 7.368 12/12/2022 1326   PCO2ART 39.4 12/12/2022 1326   PO2ART 146 (H) 12/12/2022 1326   HCO3 23.0 12/12/2022 1326   TCO2 18 (L) 03/05/2023 1031   ACIDBASEDEF 3.0 (H) 12/12/2022 1326   O2SAT 99 12/12/2022 1326    Coagulation Profile: No results for input(s): "INR", "PROTIME" in the last 168 hours. Cardiac Enzymes: Recent Labs  Lab 11/26/23 1033 11/27/23 0341  CKTOTAL 6,948* 3,076*   HbA1C: Hgb A1c MFr Bld  Date/Time Value Ref Range Status  07/10/2023 10:03 AM 5.2 4.8 - 5.6 % Final    Comment:             Prediabetes: 5.7 - 6.4          Diabetes: >6.4          Glycemic control for adults with diabetes: <7.0   03/19/2023 10:22 AM 5.7 (H) 4.8 - 5.6 % Final    Comment:    (NOTE)         Prediabetes: 5.7 - 6.4         Diabetes: >6.4         Glycemic control for adults with diabetes: <7.0    CBG: Recent Labs  Lab 11/26/23 1941 11/26/23 2346 11/27/23 0346 11/27/23 0732 11/27/23 1111  GLUCAP 137* 143* 159* 144* 131*   Consults  Oncology Assessment & Plan:  Principal Problem:   Sepsis (HCC) Active Problems:   Normocytic anemia   Hypovolemic shock (HCC)   Rhabdomyolysis   Pressure injury of skin   AKI (acute kidney injury) (HCC)   DVT (deep venous thrombosis) (HCC)   GERD (gastroesophageal reflux disease)   Transaminitis  Undifferentiated shock, likely hypovolemic  Possible septic shock Osm 325. Currently on IVF to treat for hypovolemic shock. On Zosyn for 7 days for presumed septic shock. Etiology of infection unclear at this time. This can be discontinued once pt is clinically well appearing. Pt off levophed after IVF. Echo with normal EF.  - Continue IVF - Trend WBC, fever curve - F/u Cx data - Continue abx for now.    Rhabdomyolysis AKI on CKD stage IIIa in the setting of rhabdomyolysis Improving with  IVF. 1.38 this am. Continue IVF for now until pt is tolerating good PO intake.    Prostate CA Metastatic disease of cervical LN, subcutaneous tissue, bilateral adrenal glands, spine/bone CT C-Spine demonstrated large lytic lesion of T1 spinous process/lamina c/f osseous metastatic disease with likely epidural spread of tumor (unchanged from 11/18) and multiple enlarged cervical LN c/f nodal mets. CT A/P with findings c/w metastatic disease (multiple subQ masses, bilateral adrenal masses, R RP masses and multiple osseous metastases) with largest osseous metastasis of T10  vertebral body with cord compression, no scrotal mass or fluid collection.Presume prostatic primary but there is no discussion of this in recent Heme/Onc notes. Given negative PSA unsure if primary is prostate or another source.  -Oncology consulted  LLE DVT Noted on doppler. Likely secondary to hypercoagulable state in setting of maligancy. Will start heparin for now and transition to oral DOAC.   HTN HLD PAD with history of critical limb ischemia resulting in R AKA - Hold home antihypertensives in the setting of hypotension/shock - Continue ASA  Scrotal wounds, ?moisture/pressure associated, POA CT A/P 12/2 without evidence of scrotal mass or fluid collection. - WOCN consult, appreciate recommendations.  - Foley catheter placement to prevent further moisture-associated skin breakdown   Normocytic anemia, likely anemia of chronic disease CBC 7.1 this am.  - Trend H&H - Monitor for signs of active bleeding - Transfuse for Hgb < 7.0 or hemodynamically significant bleeding  GERD Continue home PPI   Mild transaminitis Presume 2/2 hypotension/hypoperfusion. Trend LFTs to normal. Hold statin for now   Phantom limb pain Will resume home gabapentin once mental status improves.   Best Practice (right click and "Reselect all SmartList Selections" daily)  Diet/type: Heart DVT prophylaxis: prophylactic heparin  GI  prophylaxis: None Lines: N/A and No longer needed.  Order written to d/c  Foley:  Yes, and it is still needed Continuous: Levophed Code Status:  full code Last date of multidisciplinary goals of care discussion [Plan for 12/03]

## 2023-11-27 NOTE — Progress Notes (Signed)
eLink Physician-Brief Progress Note Patient Name: [name] DOB: 1951/05/27 MRN: [id number]   Date of Service  11/27/2023  HPI/Events of Note  72 year old man who is critically ill due to hypotension requiring titration of vasopressors.  Found down with profound weakness with concern for spinal metastasis.  MRI pending.  eICU Interventions  Ativan x 1   Resume oxycodone at reduced doses on sliding scale.   0402 -hemoglobin 6.7 on heparin infusion.  Transfuse 1 unit PRBC.  Intervention Category Minor Interventions: Agitation / anxiety - evaluation and management  Halimah Bewick 11/27/2023, 9:19 PM

## 2023-11-27 NOTE — Progress Notes (Addendum)
[name]   DOB:06/26/1951   ZO#:[id number]      ASSESSMENT & PLAN:  metastatic disease with unknown primary  -history of prostate cancer s/p prostatectomy and radiation therapy x30 treatments in 2020 -follows with Dr. Ellin Saba at [facility], last seen 10/23/23 -CT cervical spine/head/A/P and maxillofacial done 11/26/23.  Shows large lytic lesion T1 likely mets with likely epidural spread of tumor along dorsal aspect of spinal canal at T1.  Also demonstrates multiple enlarged cervical lymph nodes in neck concerning for nodal mets; bil adrenal masses, multiple bone mets.  -Ordered CT chest, will follow results. Suspect Lung Ca.  2. Enlarged right cervical lymph node -palpated enlarged right cervical lymph node -recommend IR u/s guided bx of cervical LN -ordered. Follow up results.  3. Anemia, normocytic -likely multifactorial due to malignancy +infectious process +CKD -s/p prbc transfusions at [facility], last noted transfusion 10/25/23. -has been on oral iron  -hgb low 9.6-->7.1 today -recommend PRBC transfusion for hgb <7.0 -monitor closely  4 Sepsis -MICU management ongoing -continue IV abx therapy as ordered; IV Vanco and IV Zosyn. -afebrile; monitor fever curve -monitor labs  5. Hx of PVD -s/p AKA in Feb 2024  -has been outfitted with prosthesis and reports ambulates with it   Code Status: Full  Goals of Care: Patient requesting to pursue treatment if cancer is back. GOC discussion will be considered with patient and family when all dx tests available.    Discharge planning: Follows with Dr. Ellin Saba, noted next outpatient appt 12/06/23.  Address acute illness while hospitalized, arrange follow up with Dr. Ellin Saba upon discharge.   Subjective:  Patient has been transferred from [facility] for septic shock and rhabdomyolysis. Admitted to MICU for further care. Patient seen awake and alert laying in bed.  Sister [name] at bedside who states patient lives alone and has been  independent until one month ago when he started to decline.  Noted intermittent confusion while talking to patient, he states he was told 12 years ago that his cancer was "gone" and not coming back; his sister corrected that he had it 4 years ago.     Objective:  Vitals:   11/27/23 1000 11/27/23 1135  BP: (!) 111/48   Pulse: 94   Resp: 15   Temp:  98.5 F (36.9 C)  SpO2: 100%      Intake/Output Summary (Last 24 hours) at 11/27/2023 1252 Last data filed at 11/27/2023 1000 Gross per 24 hour  Intake 3555.11 ml  Output 925 ml  Net 2630.11 ml     REVIEW OF SYSTEMS:   Constitutional: +fatigue, Denies fevers, chills or abnormal night sweats Eyes: Denies blurriness of vision, double vision or watery eyes Ears, nose, mouth, throat, and face: Denies mucositis or sore throat Respiratory: Denies cough, dyspnea or wheezes Cardiovascular: Denies palpitation, chest discomfort or lower extremity swelling Gastrointestinal:  Denies nausea, heartburn or change in bowel habits Skin: Denies abnormal skin rashes Lymphatics: Denies new lymphadenopathy or easy bruising Neurological: Denies numbness, tingling or new weaknesses Behavioral/Psych: Mood is stable, no new changes Musculoskeletal:  +pain R shoulder.  All other systems were reviewed with the patient and are negative.  PHYSICAL EXAMINATION: ECOG PERFORMANCE STATUS: 3 - Symptomatic, >50% confined to bed  Vitals:   11/27/23 1000 11/27/23 1135  BP: (!) 111/48   Pulse: 94   Resp: 15   Temp:  98.5 F (36.9 C)  SpO2: 100%    Filed Weights   11/26/23 1849 11/26/23 1922 11/27/23 0500  Weight: 196  lb 13.9 oz (89.3 kg) 201 lb 8 oz (91.4 kg) 201 lb 8 oz (91.4 kg)    GENERAL: alert, no distress and comfortable SKIN: +pale skin color, texture, turgor are normal, no rashes or significant lesions EYES: normal, conjunctiva are pink and non-injected, sclera clear OROPHARYNX: no exudate, no erythema and lips, buccal mucosa, and tongue normal   NECK: supple, thyroid normal size, non-tender, without nodularity LYMPH: + palpable lymphadenopathy in the cervical, not palpable axillary or inguinal LUNGS: clear to auscultation and percussion with normal breathing effort HEART: regular rate & rhythm and no murmurs and no lower extremity edema ABDOMEN: abdomen soft, non-tender and normal bowel sounds MUSCULOSKELETAL: +Left AKA, no cyanosis of digits and no clubbing  PSYCH: alert & oriented x 3 with fluent speech NEURO: no focal motor/sensory deficits   All questions were answered. The patient knows to call the clinic with any problems, questions or concerns.   The total time spent in the appointment was 30 minutes encounter with patient including review of chart and various tests results, discussions about plan of care and coordination of care plan  Dawson Bills, NP 11/27/2023 12:52 PM    Labs Reviewed:  Lab Results  Component Value Date   WBC 11.7 (H) 11/27/2023   HGB 7.1 (L) 11/27/2023   HCT 22.4 (L) 11/27/2023   MCV 84.5 11/27/2023   PLT 199 11/27/2023   Recent Labs    11/12/23 1519 11/26/23 1033 11/27/23 0859  NA 134* 139 140  K 3.6 4.0 3.7  CL 97* 101 105  CO2 25 21* 26  GLUCOSE 108* 165* 160*  BUN 37* 97* 60*  CREATININE 1.60* 2.21* 1.38*  CALCIUM 9.9 12.4* 10.5*  GFRNONAA 45* 31* 54*  PROT 7.5 8.0 5.3*  ALBUMIN 2.5* 2.6* 1.6*  AST 31 149* 119*  ALT 30 69* 65*  ALKPHOS 101 104 77  BILITOT 0.4 1.1 0.8    Studies Reviewed:  VAS [place] LOWER EXTREMITY VENOUS (DVT)  Result Date: 11/27/2023  Lower Venous DVT Study Patient Name:  [name]  Date of Exam:   11/27/2023 Medical Rec #: [id number]            Accession #:    [id number] Date of Birth: October 29, 1951            Patient Gender: M Patient Age:   72 years Exam Location:  [facility] Procedure:      VAS [place] LOWER EXTREMITY VENOUS (DVT) Referring Phys: JESSICA MARSHALL --------------------------------------------------------------------------------   Indications: Swelling, and Recent fall. History of right AKA on 03/09/23.  Risk Factors: Surgery History of right common femoral to posterior tibial artery bypass on 12/12/22. Comparison Study: No priors. Performing Technologist: Marilynne Halsted RDMS, RVT  Examination Guidelines: A complete evaluation includes B-mode imaging, spectral Doppler, color Doppler, and power Doppler as needed of all accessible portions of each vessel. Bilateral testing is considered an integral part of a complete examination. Limited examinations for reoccurring indications may be performed as noted. The reflux portion of the exam is performed with the patient in reverse Trendelenburg.  +---------+---------------+---------+-----------+----------+-------------------+ RIGHT    CompressibilityPhasicitySpontaneityPropertiesThrombus Aging      +---------+---------------+---------+-----------+----------+-------------------+ CFV      Partial        Yes      Yes                  Chronic             +---------+---------------+---------+-----------+----------+-------------------+ FV Prox  Partial  Chronic             +---------+---------------+---------+-----------+----------+-------------------+ FV Mid   Partial                                      Chronic             +---------+---------------+---------+-----------+----------+-------------------+ FV Distal                                             Not well visualized +---------+---------------+---------+-----------+----------+-------------------+ PFV      Partial                                                          +---------+---------------+---------+-----------+----------+-------------------+ POP                                                   AKA                 +---------+---------------+---------+-----------+----------+-------------------+ Occluded bypass graft.   +---------+---------------+---------+-----------+----------+-----------------+ LEFT     CompressibilityPhasicitySpontaneityPropertiesThrombus Aging    +---------+---------------+---------+-----------+----------+-----------------+ CFV      Full           Yes      Yes                                    +---------+---------------+---------+-----------+----------+-----------------+ SFJ      Full                                                           +---------+---------------+---------+-----------+----------+-----------------+ FV Prox  Full                                                           +---------+---------------+---------+-----------+----------+-----------------+ FV Mid   Full                                                           +---------+---------------+---------+-----------+----------+-----------------+ FV DistalPartial        Yes      Yes                  Age Indeterminate +---------+---------------+---------+-----------+----------+-----------------+ PFV      Full                                                           +---------+---------------+---------+-----------+----------+-----------------+  POP      Partial        No       No                   Age Indeterminate +---------+---------------+---------+-----------+----------+-----------------+ PTV      None                                         Age Indeterminate +---------+---------------+---------+-----------+----------+-----------------+ PERO     None                                         Age Indeterminate +---------+---------------+---------+-----------+----------+-----------------+    Summary: RIGHT: - Findings consistent with chronic deep vein thrombosis involving the right femoral vein.  LEFT: - Findings consistent with age indeterminate deep vein thrombosis involving the left femoral vein, left popliteal vein, left posterior tibial veins, and left peroneal veins.    *See table(s) above for measurements and observations.    Preliminary    ECHOCARDIOGRAM COMPLETE  Result Date: 11/27/2023    ECHOCARDIOGRAM REPORT   Patient Name:   [name] Date of Exam: 11/27/2023 Medical Rec #:  [id number]           Height:       70.0 in Accession #:    [id number]          Weight:       201.5 lb Date of Birth:  1951-11-08           BSA:          2.094 m Patient Age:    72 years            BP:           105/57 mmHg Patient Gender: M                   HR:           90 bpm. Exam Location:  Inpatient Procedure: 2D Echo, Cardiac Doppler and Color Doppler Indications:    Shock R57.9  History:        Patient has no prior history of Echocardiogram examinations. CKD                 and PAD; Risk Factors:Hypertension and Former Smoker.  Sonographer:    Dondra Prader RVT RCS Referring Phys: JESSICA MARSHALL  Sonographer Comments: Image acquisition challenging due to uncooperative patient and Image acquisition challenging due to respiratory motion. Patient unable to reposition; Echo done with patient supine. Unable to do breath holds. IMPRESSIONS  1. Left ventricular ejection fraction, by estimation, is 60 to 65%. The left ventricle has normal function. The left ventricle has no regional wall motion abnormalities. There is mild left ventricular hypertrophy. Left ventricular diastolic parameters were normal.  2. Right ventricular systolic function is hyperdynamic. The right ventricular size is normal. Tricuspid regurgitation signal is inadequate for assessing PA pressure.  3. The mitral valve is normal in structure. No evidence of mitral valve regurgitation. No evidence of mitral stenosis.  4. Abnormal echodensity seen in the right atrium only in subcostal view. This is superior to the tricuspid valve annulus. Cannot exclude normal variant anatomy, only seen in clip 65. Not seen in recent CTPE for comparison. The tricuspid valve is abnormal.  5.  The aortic valve is tricuspid. Aortic valve  regurgitation is mild to moderate. Aortic valve sclerosis is present, with no evidence of aortic valve stenosis.  6. The inferior vena cava is dilated in size with <50% respiratory variability, suggesting right atrial pressure of 15 mmHg. Comparison(s): No prior Echocardiogram. FINDINGS  Left Ventricle: Left ventricular ejection fraction, by estimation, is 60 to 65%. The left ventricle has normal function. The left ventricle has no regional wall motion abnormalities. The left ventricular internal cavity size was normal in size. There is  mild left ventricular hypertrophy. Left ventricular diastolic parameters were normal. Right Ventricle: The right ventricular size is normal. No increase in right ventricular wall thickness. Right ventricular systolic function is hyperdynamic. Tricuspid regurgitation signal is inadequate for assessing PA pressure. Left Atrium: Left atrial size was normal in size. Right Atrium: Right atrial size was normal in size. Pericardium: There is no evidence of pericardial effusion. Mitral Valve: The mitral valve is normal in structure. No evidence of mitral valve regurgitation. No evidence of mitral valve stenosis. Tricuspid Valve: Abnormal echodensity seen in the right atrium only in subcostal view. This is superior to the tricuspid valve annulus. Cannot exclude normal variant anatomy, only seen in clip 65. Not seen in recent CTPE for comparison. The tricuspid valve is abnormal. Tricuspid valve regurgitation is not demonstrated. No evidence of tricuspid stenosis. Aortic Valve: The aortic valve is tricuspid. There is mild aortic valve annular calcification. Aortic valve regurgitation is mild to moderate. Aortic regurgitation PHT measures 360 msec. Aortic valve sclerosis is present, with no evidence of aortic valve  stenosis. Aortic valve mean gradient measures 5.0 mmHg. Aortic valve peak gradient measures 9.1 mmHg. Aortic valve area, by VTI measures 2.41 cm. Pulmonic Valve: The pulmonic valve  was normal in structure. Pulmonic valve regurgitation is trivial. No evidence of pulmonic stenosis. Aorta: The aortic root and ascending aorta are structurally normal, with no evidence of dilitation. Venous: The inferior vena cava is dilated in size with less than 50% respiratory variability, suggesting right atrial pressure of 15 mmHg. IAS/Shunts: No atrial level shunt detected by color flow Doppler.  LEFT VENTRICLE PLAX 2D LVIDd:         4.20 cm   Diastology LVIDs:         2.60 cm   LV e' medial:    8.70 cm/s LV PW:         1.10 cm   LV E/e' medial:  9.9 LV IVS:        1.20 cm   LV e' lateral:   10.10 cm/s LVOT diam:     2.00 cm   LV E/e' lateral: 8.5 LV SV:         63 LV SV Index:   30 LVOT Area:     3.14 cm  RIGHT VENTRICLE             IVC RV S prime:     12.20 cm/s  IVC diam: 2.15 cm TAPSE (M-mode): 2.8 cm LEFT ATRIUM             Index        RIGHT ATRIUM           Index LA diam:        2.90 cm 1.38 cm/m   RA Area:     11.80 cm LA Vol (A2C):   30.1 ml 14.37 ml/m  RA Volume:   27.10 ml  12.94 ml/m LA Vol (A4C):   20.0 ml 9.55 ml/m  LA Biplane Vol: 27.0 ml 12.89 ml/m  AORTIC VALVE                     PULMONIC VALVE AV Area (Vmax):    2.33 cm      PV Vmax:       1.04 m/s AV Area (Vmean):   2.38 cm      PV Peak grad:  4.3 mmHg AV Area (VTI):     2.41 cm AV Vmax:           151.00 cm/s AV Vmean:          100.000 cm/s AV VTI:            0.261 m AV Peak Grad:      9.1 mmHg AV Mean Grad:      5.0 mmHg LVOT Vmax:         112.00 cm/s LVOT Vmean:        75.600 cm/s LVOT VTI:          0.200 m LVOT/AV VTI ratio: 0.77 AI PHT:            360 msec AR Vena Contracta: 0.50 cm  AORTA Ao Root diam: 3.40 cm Ao Asc diam:  3.30 cm MITRAL VALVE MV Area (PHT): 4.63 cm    SHUNTS MV Decel Time: 164 msec    Systemic VTI:  0.20 m MV E velocity: 85.90 cm/s  Systemic Diam: 2.00 cm MV A velocity: 88.00 cm/s MV E/A ratio:  0.98 Riley Lam MD Electronically signed by Riley Lam MD Signature Date/Time:  11/27/2023/9:13:01 AM    Final    CT ABDOMEN PELVIS W CONTRAST  Result Date: 11/26/2023 CLINICAL DATA:  Increasing weakness for 2-3 days with recent fall. Scrotal mass or lump, scrotal wound, concern for abscess versus deep space infection. Evidence of metastatic disease on recent chest CTA. * Tracking Code: BO * EXAM: CT ABDOMEN AND PELVIS WITH CONTRAST TECHNIQUE: Multidetector CT imaging of the abdomen and pelvis was performed using the standard protocol following bolus administration of intravenous contrast. RADIATION DOSE REDUCTION: This exam was performed according to the departmental dose-optimization program which includes automated exposure control, adjustment of the mA and/or kV according to patient size and/or use of iterative reconstruction technique. CONTRAST:  75mL OMNIPAQUE IOHEXOL 300 MG/ML  SOLN COMPARISON:  Noncontrast CT of the abdomen and pelvis 03/05/2019. Chest CTA 11/12/2023. FINDINGS: Lower chest: Mild dependent atelectasis at both lung bases. No significant pleural or pericardial effusion. There is aortic and coronary artery atherosclerosis. Partially imaged mass again noted involving the anterior aspect of the right 5th rib, measuring up to 3.8 x 2.9 cm on image 2/1. Hepatobiliary: The liver is normal in density without suspicious focal abnormality. Subcentimeter low-density lesion inferiorly in the right hepatic lobe on image 26/1 is unchanged from remote abdominal CT, likely a cyst. No evidence of gallstones, gallbladder wall thickening or biliary dilatation. Pancreas: Unremarkable. No pancreatic ductal dilatation or surrounding inflammatory changes. Spleen: Normal in size without focal abnormality. Adrenals/Urinary Tract: As seen on recent chest CT, new bilateral adrenal masses consistent with metastatic disease. Right adrenal nodule measures 3.0 x 2.0 cm on image 20/1 and left adrenal nodule measures 2.3 x 2.2 cm on image 24/1. No evidence of urinary tract calculus, suspicious renal  lesion or hydronephrosis. There are renal cysts bilaterally which are similar to remote abdominal CT. The bladder appears unremarkable for its degree of distention. Stomach/Bowel: No definite enteric contrast administered. There is high density  colonic stool. The stomach appears unremarkable for its degree of distension. No evidence of bowel wall thickening, distention or surrounding inflammatory change. The appendix appears normal. Vascular/Lymphatic: There are no enlarged abdominal or pelvic lymph nodes. Aortic and branch vessel atherosclerosis without evidence of aneurysm or large vessel occlusion in the abdomen or pelvis. Postsurgical changes at the right groin consistent with femoral arterial bypass grafting. There is low-density within the femoral graft suspicious for occlusion. In addition, there is low-density in the right femoral vein which could reflect a DVT. No intrapelvic extension identified. Reproductive: Status post prostatectomy. No recurrent mass lesion identified. Other: As partially imaged on recent chest CT, there are multiple abdominal wall masses consistent with metastatic disease. Representative lesions include a 3.4 cm mass posteriorly in the left mid abdominal wall on image 33/1, a 3.2 x 2.2 cm mass in the left anterior abdominal wall on image 42/1 and a 2.9 cm right suprapubic mass on image 84/1. No obvious scrotal mass or fluid collection. In addition to the subcutaneous lesions, there are 2 retroperitoneal masses on the right, largest measuring 1.7 cm on image 44/1. There is a supraumbilical hernia containing only fat. No ascites or free air. Musculoskeletal: As above, multiple subcutaneous metastases. In addition, as seen on recent chest CT, there is a large destructive mass involving the left aspect of the T10 vertebral body, measuring 3.5 x 3.0 cm on image 8/1. This mass is associated with intraspinal extension and possible thoracic cord compression, as before. There are other smaller  osseous metastases within the L1 and L4 vertebral bodies, the right superior pubic ramus, the left superior acetabulum and the left femoral greater trochanter. IMPRESSION: 1. Findings are consistent with metastatic disease, including multiple subcutaneous metastases, bilateral adrenal masses, right retroperitoneal masses and multiple osseous metastases. 2. The largest osseous metastasis involves the T10 vertebral body with intraspinal extension and possible thoracic cord compression, as on previous chest CTA. Consider thoracic spine MRI for further evaluation. 3. No obvious scrotal mass or fluid collection. 4. Postsurgical changes at the right groin consistent with femoral arterial bypass grafting. Low-density within the femoral graft is suspicious for occlusion. In addition, there is low-density in the right femoral vein which could reflect a DVT or sequela of poor inflow. No intrapelvic extension identified. 5.  Aortic Atherosclerosis (ICD10-I70.0). Electronically Signed   By: Carey Bullocks M.D.   On: 11/26/2023 15:35   CT HEAD WO CONTRAST ( )  Result Date: 11/26/2023 CLINICAL DATA:  Head trauma, minor (Age >= 65y); Facial trauma, blunt; Neck trauma (Age >= 65y) EXAM: CT HEAD WITHOUT CONTRAST CT MAXILLOFACIAL WITHOUT CONTRAST CT CERVICAL SPINE WITHOUT CONTRAST TECHNIQUE: Multidetector CT imaging of the head, cervical spine, and maxillofacial structures were performed using the standard protocol without intravenous contrast. Multiplanar CT image reconstructions of the cervical spine and maxillofacial structures were also generated. RADIATION DOSE REDUCTION: This exam was performed according to the departmental dose-optimization program which includes automated exposure control, adjustment of the mA and/or kV according to patient size and/or use of iterative reconstruction technique. COMPARISON:  CT Chest 11/12/23 FINDINGS: CT HEAD FINDINGS Brain: No hemorrhage. No hydrocephalus. No extra-axial fluid  collection. No CT evidence of cortical infarct. No mass effect. No mass lesion. Vascular: No hyperdense vessel or unexpected calcification. Skull: Normal. Negative for fracture or focal lesion. Other: Soft tissue hematoma along the left frontal scalp CT MAXILLOFACIAL FINDINGS Osseous: No fracture or mandibular dislocation. No destructive process. Orbits: Negative. No traumatic or inflammatory finding. Sinuses: No middle ear or  mastoid effusion. Paranasal sinuses are notable for mucosal thickening left maxillary sinus. Postsurgical changes from prior left maxillary antrostomy. Soft tissues: Soft tissue swelling in the periorbital soft tissues on the left. CT CERVICAL SPINE FINDINGS Alignment: Normal. Skull base and vertebrae: No acute fracture. There is a large lytic lesion involving the T1 spinous process and lamina, worrisome for osseous metastatic disease. Soft tissues and spinal canal: There are multiple enlarged cervical lymph nodes predominantly in the right neck, for example a 2.1 cm right level 3 lymph node (series 5, image 52) and a 2.1 cm right paraesophageal lymph node. Disc levels: There is likely epidural spread of tumor along the dorsal aspect of the spinal canal at the T1 level (series 5, image 63). Upper chest: See separately dictated CT chest abdomen and pelvis for additional findings Other: No IMPRESSION: 1. No acute intracranial abnormality. 2. No acute facial bone fracture. 3. No acute fracture or traumatic listhesis of the cervical spine. 4. Large lytic lesion involving the T1 spinous process and lamina, worrisome for osseous metastatic disease. There is likely epidural spread of tumor along the dorsal aspect of the spinal canal at the T1 level. This is unchanged from 11/12/23 5. Multiple enlarged cervical lymph nodes, predominantly in the right neck, worrisome for nodal metastatic disease. 6. Soft tissue hematoma along the left frontal scalp. No underlying calvarial fracture. Electronically  Signed   By: Lorenza Cambridge M.D.   On: 11/26/2023 13:59   CT CERVICAL SPINE WO CONTRAST  Result Date: 11/26/2023 CLINICAL DATA:  Head trauma, minor (Age >= 65y); Facial trauma, blunt; Neck trauma (Age >= 65y) EXAM: CT HEAD WITHOUT CONTRAST CT MAXILLOFACIAL WITHOUT CONTRAST CT CERVICAL SPINE WITHOUT CONTRAST TECHNIQUE: Multidetector CT imaging of the head, cervical spine, and maxillofacial structures were performed using the standard protocol without intravenous contrast. Multiplanar CT image reconstructions of the cervical spine and maxillofacial structures were also generated. RADIATION DOSE REDUCTION: This exam was performed according to the departmental dose-optimization program which includes automated exposure control, adjustment of the mA and/or kV according to patient size and/or use of iterative reconstruction technique. COMPARISON:  CT Chest 11/12/23 FINDINGS: CT HEAD FINDINGS Brain: No hemorrhage. No hydrocephalus. No extra-axial fluid collection. No CT evidence of cortical infarct. No mass effect. No mass lesion. Vascular: No hyperdense vessel or unexpected calcification. Skull: Normal. Negative for fracture or focal lesion. Other: Soft tissue hematoma along the left frontal scalp CT MAXILLOFACIAL FINDINGS Osseous: No fracture or mandibular dislocation. No destructive process. Orbits: Negative. No traumatic or inflammatory finding. Sinuses: No middle ear or mastoid effusion. Paranasal sinuses are notable for mucosal thickening left maxillary sinus. Postsurgical changes from prior left maxillary antrostomy. Soft tissues: Soft tissue swelling in the periorbital soft tissues on the left. CT CERVICAL SPINE FINDINGS Alignment: Normal. Skull base and vertebrae: No acute fracture. There is a large lytic lesion involving the T1 spinous process and lamina, worrisome for osseous metastatic disease. Soft tissues and spinal canal: There are multiple enlarged cervical lymph nodes predominantly in the right neck,  for example a 2.1 cm right level 3 lymph node (series 5, image 52) and a 2.1 cm right paraesophageal lymph node. Disc levels: There is likely epidural spread of tumor along the dorsal aspect of the spinal canal at the T1 level (series 5, image 63). Upper chest: See separately dictated CT chest abdomen and pelvis for additional findings Other: No IMPRESSION: 1. No acute intracranial abnormality. 2. No acute facial bone fracture. 3. No acute fracture or traumatic listhesis  of the cervical spine. 4. Large lytic lesion involving the T1 spinous process and lamina, worrisome for osseous metastatic disease. There is likely epidural spread of tumor along the dorsal aspect of the spinal canal at the T1 level. This is unchanged from 11/12/23 5. Multiple enlarged cervical lymph nodes, predominantly in the right neck, worrisome for nodal metastatic disease. 6. Soft tissue hematoma along the left frontal scalp. No underlying calvarial fracture. Electronically Signed   By: Lorenza Cambridge M.D.   On: 11/26/2023 13:59   CT Maxillofacial Wo Contrast  Result Date: 11/26/2023 CLINICAL DATA:  Head trauma, minor (Age >= 65y); Facial trauma, blunt; Neck trauma (Age >= 65y) EXAM: CT HEAD WITHOUT CONTRAST CT MAXILLOFACIAL WITHOUT CONTRAST CT CERVICAL SPINE WITHOUT CONTRAST TECHNIQUE: Multidetector CT imaging of the head, cervical spine, and maxillofacial structures were performed using the standard protocol without intravenous contrast. Multiplanar CT image reconstructions of the cervical spine and maxillofacial structures were also generated. RADIATION DOSE REDUCTION: This exam was performed according to the departmental dose-optimization program which includes automated exposure control, adjustment of the mA and/or kV according to patient size and/or use of iterative reconstruction technique. COMPARISON:  CT Chest 11/12/23 FINDINGS: CT HEAD FINDINGS Brain: No hemorrhage. No hydrocephalus. No extra-axial fluid collection. No CT evidence  of cortical infarct. No mass effect. No mass lesion. Vascular: No hyperdense vessel or unexpected calcification. Skull: Normal. Negative for fracture or focal lesion. Other: Soft tissue hematoma along the left frontal scalp CT MAXILLOFACIAL FINDINGS Osseous: No fracture or mandibular dislocation. No destructive process. Orbits: Negative. No traumatic or inflammatory finding. Sinuses: No middle ear or mastoid effusion. Paranasal sinuses are notable for mucosal thickening left maxillary sinus. Postsurgical changes from prior left maxillary antrostomy. Soft tissues: Soft tissue swelling in the periorbital soft tissues on the left. CT CERVICAL SPINE FINDINGS Alignment: Normal. Skull base and vertebrae: No acute fracture. There is a large lytic lesion involving the T1 spinous process and lamina, worrisome for osseous metastatic disease. Soft tissues and spinal canal: There are multiple enlarged cervical lymph nodes predominantly in the right neck, for example a 2.1 cm right level 3 lymph node (series 5, image 52) and a 2.1 cm right paraesophageal lymph node. Disc levels: There is likely epidural spread of tumor along the dorsal aspect of the spinal canal at the T1 level (series 5, image 63). Upper chest: See separately dictated CT chest abdomen and pelvis for additional findings Other: No IMPRESSION: 1. No acute intracranial abnormality. 2. No acute facial bone fracture. 3. No acute fracture or traumatic listhesis of the cervical spine. 4. Large lytic lesion involving the T1 spinous process and lamina, worrisome for osseous metastatic disease. There is likely epidural spread of tumor along the dorsal aspect of the spinal canal at the T1 level. This is unchanged from 11/12/23 5. Multiple enlarged cervical lymph nodes, predominantly in the right neck, worrisome for nodal metastatic disease. 6. Soft tissue hematoma along the left frontal scalp. No underlying calvarial fracture. Electronically Signed   By: Lorenza Cambridge M.D.    On: 11/26/2023 13:59   CT Angio Chest PE W and/or Wo Contrast  Result Date: 11/13/2023 CLINICAL DATA:  Shortness of breath. Concern for pulmonary embolism. EXAM: CT ANGIOGRAPHY CHEST WITH CONTRAST TECHNIQUE: Multidetector CT imaging of the chest was performed using the standard protocol during bolus administration of intravenous contrast. Multiplanar CT image reconstructions and MIPs were obtained to evaluate the vascular anatomy. RADIATION DOSE REDUCTION: This exam was performed according to the departmental dose-optimization program  which includes automated exposure control, adjustment of the mA and/or kV according to patient size and/or use of iterative reconstruction technique. CONTRAST:  75mL OMNIPAQUE IOHEXOL 350 MG/ML SOLN COMPARISON:  CT dated 03/05/2019. Chest radiograph dated 11/12/2023. FINDINGS: Cardiovascular: There is no cardiomegaly or pericardial effusion. There is coronary vascular calcification. Moderate atherosclerotic calcification of the thoracic aorta. No aneurysmal dilatation or dissection. The origins of the great vessels of the aortic arch appear patent. No pulmonary artery embolus identified. Mediastinum/Nodes: Right hilar adenopathy measures 2.5 cm in short axis. Subcarinal lymph node measures 2.4 cm. Upper mediastinal adenopathy measures 2 cm in short axis. Right supraclavicular adenopathy measures 2.5 cm short axis. Mildly enlarged and rounded left axillary lymph nodes. The esophagus is grossly unremarkable. No mediastinal fluid collection. Lungs/Pleura: The lungs are clear. There is no pleural effusion or pneumothorax. The central airways are patent. Upper Abdomen: Bilateral adrenal thickening and nodularity concerning for metastatic disease. An 11 mm nodule in the right upper abdomen posterior to the superior pole of the right kidney consistent with metastatic disease. Musculoskeletal: Several metastatic implants in the subcutaneous soft tissues of the posterior thoracic wall  measuring up to 3 cm in the left lower posterior chest wall. Multiple lytic metastasis with involvement of T5, T6, and T10 vertebra. There is associated pathologic fracture of T10 with destruction and buckling of the posterior cortex and soft tissue mass extending into the central canal. There is focal high-grade narrowing of the central canal at T10. MRI may provide better evaluation. Additional lesion involving the right anterior chest wall and fifth rib as well as lytic lesion of the lateral left ninth rib. Review of the MIP images confirms the above findings. IMPRESSION: 1. No CT evidence of pulmonary artery embolus. 2. Mediastinal, right hilar, right supraclavicular, and left axillary adenopathy. 3. Upper abdominal metastasis involving the adrenal glands. 4. Multiple osseous and chest wall metastatic disease. Associated pathologic fracture of T10 with high-grade focal narrowing of the central canal. MRI may provide better evaluation. 5.  Aortic Atherosclerosis (ICD10-I70.0). Electronically Signed   By: Elgie Collard M.D.   On: 11/13/2023 00:56   DG Chest 2 View  Result Date: 11/12/2023 CLINICAL DATA:  Shortness of breath EXAM: CHEST - 2 VIEW COMPARISON:  10/11/2023 FINDINGS: The heart size and mediastinal contours are within normal limits. Aortic atherosclerosis. Minimal linear left basilar scarring or atelectasis. No focal airspace consolidation, pleural effusion, or pneumothorax. The visualized skeletal structures are unremarkable. IMPRESSION: No active cardiopulmonary disease. Electronically Signed   By: Duanne Guess D.O.   On: 11/12/2023 18:05   VAS [place] ABI WITH/WO TBI  Result Date: 10/31/2023  LOWER EXTREMITY DOPPLER STUDY Patient Name:  [name]  Date of Exam:   10/31/2023 Medical Rec #: [id number]            Accession #:    [id number] Date of Birth: [birth year]            Patient Gender: M Patient Age:   68 years Exam Location:  [facility] Procedure:      VAS [place]  ABI WITH/WO TBI Referring Phys: Lemar Livings --------------------------------------------------------------------------------   Vascular Interventions: 12/12/22: Right CFA patch angioplasty and CFA to PTA                         bypass with PTFE and distal s cm of reversed GSV.  03/09/23: Right AKA. Performing Technologist: Thereasa Parkin RVT  Examination Guidelines: A complete evaluation includes at minimum, Doppler waveform signals and systolic blood pressure reading at the level of bilateral brachial, anterior tibial, and posterior tibial arteries, when vessel segments are accessible. Bilateral testing is considered an integral part of a complete examination. Photoelectric Plethysmograph (PPG) waveforms and toe systolic pressure readings are included as required and additional duplex testing as needed. Limited examinations for reoccurring indications may be performed as noted.  ABI Findings: +--------+------------------+-----+--------+--------+ Right   Rt Pressure (mmHg)IndexWaveformComment  +--------+------------------+-----+--------+--------+ [id number]                                     +--------+------------------+-----+--------+--------+ +---------+------------------+-----+--------+-------+ Left     Lt Pressure (mmHg)IndexWaveformComment +---------+------------------+-----+--------+-------+ Brachial 112                                    +---------+------------------+-----+--------+-------+ ATA      83                0.72                 +---------+------------------+-----+--------+-------+ PTA      84                0.73                 +---------+------------------+-----+--------+-------+ Great Toe57                0.50                 +---------+------------------+-----+--------+-------+ +-------+-----------+-----------+------------+------------+ ABI/TBIToday's ABIToday's TBIPrevious ABIPrevious TBI  +-------+-----------+-----------+------------+------------+ Right  AKA                   not aquired 0            +-------+-----------+-----------+------------+------------+ Left   0.73       0.5        0.82        0.35         +-------+-----------+-----------+------------+------------+  Previous ABI pre AKA on 02/14/23.  Summary: Left: Resting left ankle-brachial index indicates moderate left lower extremity arterial disease. The left toe-brachial index is abnormal. *See table(s) above for measurements and observations.  Electronically signed by Lemar Livings MD on 10/31/2023 at 4:25:07 PM.    Final     Addendum I have seen the patient, examined him. I agree with the assessment and and plan and have edited the notes.   73 year old gentleman with past medical history of prostate cancer stage IV years ago, status post definitive treatment, last PSA was undetectable in October 2024, presented with hypovolemic shock secondary to dehydration.  Scan reviewed diffuse bone, node, subcutaneous and adrenal gland metastasis, primary is unclear at this point.  Given the pattern of metastatic disease, and his heavy smoking history, I suspect this could be metastatic lung cancer.  Will obtain CT chest without contrast today.  He has multiple large palpable right lower neck lymph nodes, I recommend IR to do ultrasound-guided biopsy tomorrow.  Will also order thoracic MRI with and without contrast to evaluate his T10 bone metastasis, to see if he needs palliative radiation.  I will follow-up after his biopsy.  Plan reviewed with his nurse and primary team.  I spent a total of 60 minutes for his visit today, more than  50% on face-to-face counseling.  Malachy Mood MD  11/27/2023

## 2023-11-27 NOTE — Progress Notes (Signed)
*  PRELIMINARY RESULTS* Echocardiogram 2D Echocardiogram has been performed.  [name] 11/27/2023, 8:52 AM

## 2023-11-27 NOTE — Progress Notes (Signed)
PHARMACY - ANTICOAGULATION CONSULT NOTE  Pharmacy Consult for heparin  Indication: DVT  Allergies  Allergen Reactions   Shellfish Allergy Swelling    Patient Measurements: Weight: 91.4 kg (201 lb 8 oz) Heparin Dosing Weight: 91.3kg   Vital Signs: Temp: 98.5 F (36.9 C) (12/03 1135) Temp Source: Axillary (12/03 1135) BP: 111/48 (12/03 1000) Pulse Rate: 94 (12/03 1000)  Labs: Recent Labs    11/26/23 1033 11/27/23 0341 11/27/23 0859  HGB 9.6* 7.1*  --   HCT 30.7* 22.4*  --   PLT 313 199  --   CREATININE 2.21*  --  1.38*  CKTOTAL 6,948* 3,076*  --     Estimated Creatinine Clearance: 54.2 mL/min (A) (by C-G formula based on SCr of 1.38 mg/dL (H)).   Medical History: Past Medical History:  Diagnosis Date   Family history of metabolic acidosis with increased anion gap 03/05/2023   GERD (gastroesophageal reflux disease)    History of kidney stones    Hx of AKA (above knee amputation), right (HCC)    Hyperlipidemia    Hypertension    Metastatic disease (HCC)    PAD (peripheral artery disease) (HCC)    S/p R CFA and PTA bypass 11/2022, critical limb ischemia s/p R AKA 02/2023   Prostate cancer [facility]) 2011   radiation june 2020   Septic shock (HCC) 11/26/2023   Umbilical hernia    Wears dentures    full   Wears glasses    Wears partial dentures    lower    Assessment: Patient admitted following a fall and was found down after 48 hours. Vascular US showing age indeterminate DVT in left femoral vein, left popliteal vein, left posterior tibial veins, and left peroneal veins HgB 7.1 and PLTS 199. Pharmacy consulted to dose heparin.   Goal of Therapy:  Heparin level 0.3-0.7 units/ml Monitor platelets by anticoagulation protocol: Yes   Plan:  2500 units of heparin bolus.  Lower bolus given recent heparin subcutaneous.   Start heparin infusion at 1500 units/hr Check anti-Xa level in 8 hours and daily while on heparin Continue to monitor H&H and platelets  Estill Batten, PharmD, BCCCP  11/27/2023,1:25 PM

## 2023-11-27 NOTE — Progress Notes (Signed)
PHARMACY - ANTICOAGULATION Pharmacy Consult for heparin  Indication: DVT Brief A/P: Heparin level within goal range  Continue Heparin at current rate   Allergies  Allergen Reactions   Shellfish Allergy Swelling    Patient Measurements: Height: 5\' 10"  (177.8 cm) Weight: 91.4 kg (201 lb 8 oz) IBW/kg (Calculated) : 73 Heparin Dosing Weight: 91.3kg   Vital Signs: Temp: 98.7 F (37.1 C) (12/03 2347) Temp Source: Oral (12/03 2347) BP: 120/57 (12/03 2215) Pulse Rate: 92 (12/03 2215)  Labs: Recent Labs    11/26/23 1033 11/27/23 0341 11/27/23 0859 11/27/23 2233  HGB 9.6* 7.1*  --   --   HCT 30.7* 22.4*  --   --   PLT 313 199  --   --   HEPARINUNFRC  --   --   --  0.61  CREATININE 2.21*  --  1.38*  --   CKTOTAL 3,329* 3,076*  --   --     Estimated Creatinine Clearance: 54.2 mL/min (A) (by C-G formula based on SCr of 1.38 mg/dL (H)).   Assessment: 72 y.o. male with DVT for heparin  Goal of Therapy:  Heparin level 0.3-0.7 units/ml Monitor platelets by anticoagulation protocol: Yes   Plan:  No change to heparin  Follow-up am labs.  Geannie Risen, PharmD, BCPS  11/27/2023,11:57 PM

## 2023-11-28 ENCOUNTER — Inpatient Hospital Stay (HOSPITAL_COMMUNITY): Payer: Medicare Other

## 2023-11-28 DIAGNOSIS — M6282 Rhabdomyolysis: Secondary | ICD-10-CM | POA: Diagnosis not present

## 2023-11-28 DIAGNOSIS — R571 Hypovolemic shock: Secondary | ICD-10-CM

## 2023-11-28 DIAGNOSIS — N179 Acute kidney failure, unspecified: Secondary | ICD-10-CM | POA: Diagnosis not present

## 2023-11-28 DIAGNOSIS — K219 Gastro-esophageal reflux disease without esophagitis: Secondary | ICD-10-CM | POA: Diagnosis not present

## 2023-11-28 HISTORY — PX: IR US GUIDE BX ASP/DRAIN: IMG2392

## 2023-11-28 LAB — BASIC METABOLIC PANEL
Anion gap: 5 (ref 5–15)
BUN: 43 mg/dL — ABNORMAL HIGH (ref 8–23)
CO2: 27 mmol/L (ref 22–32)
Calcium: 9.9 mg/dL (ref 8.9–10.3)
Chloride: 106 mmol/L (ref 98–111)
Creatinine, Ser: 1.19 mg/dL (ref 0.61–1.24)
GFR, Estimated: >60 mL/min (ref >60)
Glucose, Bld: 132 mg/dL — ABNORMAL HIGH (ref 70–99)
Potassium: 3.6 mmol/L (ref 3.5–5.1)
Sodium: 138 mmol/L (ref 135–145)

## 2023-11-28 LAB — HEPATIC FUNCTION PANEL
ALT: 58 U/L — ABNORMAL HIGH (ref 0–44)
AST: 89 U/L — ABNORMAL HIGH (ref 15–41)
Albumin: 1.5 g/dL — ABNORMAL LOW (ref 3.5–5.0)
Alkaline Phosphatase: 75 U/L (ref 38–126)
Bilirubin, Direct: 0.2 mg/dL (ref 0.0–0.2)
Indirect Bilirubin: 0.4 mg/dL (ref 0.3–0.9)
Total Bilirubin: 0.6 mg/dL (ref <1.2)
Total Protein: 5 g/dL — ABNORMAL LOW (ref 6.5–8.1)

## 2023-11-28 LAB — PREPARE RBC (CROSSMATCH)

## 2023-11-28 LAB — OSMOLALITY: Osmolality: 325 mosm/kg (ref 275–295)

## 2023-11-28 LAB — CBC
HCT: 21.3 % — ABNORMAL LOW (ref 39.0–52.0)
Hemoglobin: 6.7 g/dL — CL (ref 13.0–17.0)
MCH: 27.3 pg (ref 26.0–34.0)
MCHC: 31.5 g/dL (ref 30.0–36.0)
MCV: 86.9 fL (ref 80.0–100.0)
Platelets: 210 10*3/uL (ref 150–400)
RBC: 2.45 MIL/uL — ABNORMAL LOW (ref 4.22–5.81)
RDW: 17.9 % — ABNORMAL HIGH (ref 11.5–15.5)
WBC: 10.3 10*3/uL (ref 4.0–10.5)
nRBC: 0 % (ref 0.0–0.2)

## 2023-11-28 LAB — PROCALCITONIN: Procalcitonin: 0.53 ng/mL

## 2023-11-28 LAB — HEPARIN LEVEL (UNFRACTIONATED): Heparin Unfractionated: 0.54 [IU]/mL (ref 0.30–0.70)

## 2023-11-28 LAB — GLUCOSE, CAPILLARY: Glucose-Capillary: 120 mg/dL — ABNORMAL HIGH (ref 70–99)

## 2023-11-28 LAB — HEMOGLOBIN AND HEMATOCRIT, BLOOD
HCT: 27.7 % — ABNORMAL LOW (ref 39.0–52.0)
Hemoglobin: 8.5 g/dL — ABNORMAL LOW (ref 13.0–17.0)

## 2023-11-28 MED ORDER — SODIUM CHLORIDE 0.9% IV SOLUTION: Freq: Once | INTRAVENOUS | Status: DC

## 2023-11-28 MED ORDER — LACTATED RINGERS IV SOLN: INTRAVENOUS | Status: AC

## 2023-11-28 MED ORDER — GADOBUTROL 1 MMOL/ML IV SOLN
9.0000 mL | Freq: Once | INTRAVENOUS | Status: AC | PRN
  Administered 2023-11-28: 9 mL via INTRAVENOUS

## 2023-11-28 MED ORDER — LIDOCAINE HCL (PF) 1 % IJ SOLN
30.0000 mL | Freq: Once | INTRAMUSCULAR | Status: AC
  Administered 2023-11-28: 10 mL

## 2023-11-28 NOTE — Progress Notes (Signed)
eLink Physician-Brief Progress Note Patient Name: [name] DOB: 02-Mar-1951 MRN: [id number]   Date of Service  11/28/2023  HPI/Events of Note  72 year old man who is critically ill due to hypotension requiring titration of vasopressors.  Source of sepsis not well identified but clinically improved for the past 18 hours without evidence of hypotension.  Vital signs reviewed and essentially normal.  Transferred out of ICU to MedSurg.  eICU Interventions  DC cardiac monitoring, okay to transfer     Intervention Category Minor Interventions: Routine modifications to care plan (e.g. PRN medications for pain, fever)  Seema Blum 11/28/2023, 9:24 PM

## 2023-11-28 NOTE — Progress Notes (Signed)
NAME:  [name], MRN:  [id number], DOB:  [birth year], LOS: 2 ADMISSION DATE:  11/26/2023, CONSULTATION DATE:  12/02 REFERRING MD:  EDP CHIEF COMPLAINT:  Fall/AMS   History of Present Illness:  72 year old man who presented to [facility] 12/2 as a transfer from [facility] for septic shock and rhabdomyolysis. PMHx significant for HTN, HLD, PAD with critical limb ischemia s/p R AKA 02/2023 ([facility])CKD stage IIIa, nephrolithiasis, GERD, prostate CA (s/p prostatectomy/XRT 2020). Recent ED presentation to [facility] 11/13/2023 for SOB, anemia (CTA Chest negative for PE).   Patient reported fall at home with increased weakness x 2-3 days, hit L side of his head upon falling. Last seen by family 48H prior to presentation. On date of admission found on the ground in his bedroom; per family patient is significantly weaker than usual and altered compared to his baseline mental status. On ED arrival, patient was hypothermic, tachycardic to 106, hypotensive to 74/48, RR 18, SpO2 98% on RA. Labs were notable for WBC 14.4, Hgb 9.6 (baseline), Plt 313. Na 139, K 4.0, CO2 21, BUN 97/Cr 2.21 (baseline Cr 1.3-1.5). Mildly elevated transaminases 149/69, Alk Phos/Tbili WNL. TSH 2.4. LA 2.0, improved to 1.6 with rehydration. CK 6948 c/w rhabdomyolysis. CT Head/C-Spine/Maxillofacial NAICA without acute facial bone fx or acute fracture/listhesis of C-spine, soft tissue hematoma of L frontal scalp. CT A/P with findings c/w metastatic disease (multiple subQ masses, bilateral adrenal masses, R RP masses and multiple osseous metastases) with largest osseous metastasis of T10 vertebral body with cord compression, no scrotal mass or fluid collection. Peripheral Levophed was initiated and cefepime x 1 given, broadened to Zosyn/Vanc pending Cx data.   PCCM consulted for ICU admission and transfer to [facility]. Pertinent  Medical History  HTN HLD c/b PAD and right AKA CKD IIIa Nephrolithiasis GERD Prostate Cancer s/p prostatectomy  2020  Significant Hospital Events: Including procedures, antibiotic start and stop dates in addition to other pertinent events   12/02: Admitted to [facility] for septic shock c/b rhabdomyolysis.  Interim History / Subjective:  Some hypotension overnight, requiring levophed to be restarted at lower doses. Given oxy and IV ativan for agitation.   Objective   Blood pressure (!) 110/57, pulse 88, temperature 98.7 F (37.1 C), temperature source Oral, resp. rate 11, height 5\' 10"  (1.778 m), weight 92.4 kg, SpO2 96%.        Intake/Output Summary (Last 24 hours) at 11/28/2023 0842 Last data filed at 11/28/2023 0600 Gross per 24 hour  Intake 3035.01 ml  Output 1725 ml  Net 1310.01 ml   Filed Weights   11/27/23 0500 11/27/23 1135 11/28/23 0501  Weight: 91.4 kg 91.4 kg 92.4 kg    Examination:  General: NCAT, tired appearing HENT: NCAT Lungs: CTAB Cardiovascular: NSR Abdomen: No TTP, normal bowel sounds, mass noted in left quadrant MSK: right AKA, no lower extremity edema, left chest TTP Neuro: alert and oriented, delay in response time. Some inconsistencies in his given hx.  GU: foley present  Labs   CBC: Recent Labs  Lab 11/26/23 1033 11/27/23 0341 11/28/23 0324  WBC 14.4* 11.7* 10.3  NEUTROABS 12.8*  --   --   HGB 9.6* 7.1* 6.7*  HCT 30.7* 22.4* 21.3*  MCV 88.0 84.5 86.9  PLT 313 199 210   Basic Metabolic Panel: Recent Labs  Lab 11/26/23 1033 11/27/23 0341 11/27/23 0859 11/28/23 0324  NA 139  --  140 138  K 4.0  --  3.7 3.6  CL 101  --  105 106  CO2 21*  --  26 27  GLUCOSE 165*  --  160* 132*  BUN 97*  --  60* 43*  CREATININE 2.21*  --  1.38* 1.19  CALCIUM 12.4*  --  10.5* 9.9  MG  --  2.0  --   --   PHOS  --  3.2  --   --    GFR: Estimated Creatinine Clearance: 63.2 mL/min (by C-G formula based on SCr of 1.19 mg/dL). Recent Labs  Lab 11/26/23 1033 11/26/23 1220 11/26/23 1406 11/27/23 0341 11/28/23 0324  PROCALCITON  --   --   --   --  0.53  WBC  14.4*  --   --  11.7* 10.3  LATICACIDVEN  --  2.0* 1.6  --   --    Liver Function Tests: Recent Labs  Lab 11/26/23 1033 11/27/23 0859  AST 149* 119*  ALT 69* 65*  ALKPHOS 104 77  BILITOT 1.1 0.8  PROT 8.0 5.3*  ALBUMIN 2.6* 1.6*   No results for input(s): "LIPASE", "AMYLASE" in the last 168 hours. No results for input(s): "AMMONIA" in the last 168 hours. ABG    Component Value Date/Time   PHART 7.368 12/12/2022 1326   PCO2ART 39.4 12/12/2022 1326   PO2ART 146 (H) 12/12/2022 1326   HCO3 23.0 12/12/2022 1326   TCO2 18 (L) 03/05/2023 1031   ACIDBASEDEF 3.0 (H) 12/12/2022 1326   O2SAT 99 12/12/2022 1326    Coagulation Profile: No results for input(s): "INR", "PROTIME" in the last 168 hours. Cardiac Enzymes: Recent Labs  Lab 11/26/23 1033 11/27/23 0341  CKTOTAL 6,948* 3,076*   HbA1C: Hgb A1c MFr Bld  Date/Time Value Ref Range Status  07/10/2023 10:03 AM 5.2 4.8 - 5.6 % Final    Comment:             Prediabetes: 5.7 - 6.4          Diabetes: >6.4          Glycemic control for adults with diabetes: <7.0   03/19/2023 10:22 AM 5.7 (H) 4.8 - 5.6 % Final    Comment:    (NOTE)         Prediabetes: 5.7 - 6.4         Diabetes: >6.4         Glycemic control for adults with diabetes: <7.0    CBG: Recent Labs  Lab 11/27/23 1111 11/27/23 1536 11/27/23 1948 11/27/23 2345 11/28/23 0356  GLUCAP 131* 124* 120* 131* 120*   Consults  Oncology Assessment & Plan:  Principal Problem:   Sepsis (HCC) Active Problems:   Normocytic anemia   Hypovolemic shock (HCC)   Rhabdomyolysis   Pressure injury of skin   AKI (acute kidney injury) (HCC)   DVT (deep venous thrombosis) (HCC)   GERD (gastroesophageal reflux disease)   Transaminitis  Hypovolemic Shock Shock likely hypovolemic in nature given AKI, rhabdo and downtime. Improved with IVF but had hypotension overnight. Was on Zosyn but will discontinue given low procal and no source of infection and improvement in clinical  status with IVF. Pt has remained afebrile.  - Continue IVF, given pt is currently NPO - Trend WBC, fever curve - F/u Cx data - Continue abx for now.    Rhabdomyolysis AKI on CKD stage IIIa in the setting of rhabdomyolysis AKI resolved this am. CK down-trending.    Metastatic disease of cervical LN, subcutaneous tissue, bilateral adrenal glands, spine/bone CT C-Spine demonstrated large lytic lesion of T1  spinous process/lamina c/f osseous metastatic disease with likely epidural spread of tumor (unchanged from 11/18) and multiple enlarged cervical LN c/f nodal mets. CT A/P with findings c/w metastatic disease (multiple subQ masses, bilateral adrenal masses, R RP masses and multiple osseous metastases) with largest osseous metastasis of T10 vertebral body with cord compression, no scrotal mass or fluid collection.Presume prostatic primary but there is no discussion of this in recent Heme/Onc notes. Given negative PSA unsure if primary is prostate or another source. CT chest shows hilar mass suspicious for primary lung cancer, appreciate oncology involvement. Lymph node biopsy down-trending.   LLE DVT Noted on doppler. Likely secondary to hypercoagulable state in setting of maligancy. Will start heparin for now and transition to oral DOAC after biopsy and other invasive interventions.   Hx of Prostate CA PSA negative, making primary prostate cancer unlikely.   HTN HLD PAD with history of critical limb ischemia resulting in R AKA - Hold home antihypertensives in the setting of hypotension/shock - Continue ASA  Scrotal wounds, ?moisture/pressure associated, POA CT A/P 12/2 without evidence of scrotal mass or fluid collection. - WOCN consult, appreciate recommendations.  - Foley catheter placement to prevent further moisture-associated skin breakdown   Normocytic anemia, likely anemia of chronic disease CBC 6.7 this am was 7.1 previously. 1 unit ordered.  Post H/H pending. No signs of  bleeding.  - Trend H&H - Monitor for signs of active bleeding - Transfuse for Hgb < 7.0 or hemodynamically significant bleeding  GERD Chronic. Continue home PPI   Mild transaminitis Presume 2/2 hypotension/hypoperfusion. Improving. Trend LFTs to normal. Hold statin for now   Phantom limb pain Will resume home gabapentin once mental status improves.   Failure to thrive: Given fall and downtime along with current weakness and new diagnosis of cancer, pt has overall poor prognosis. Discussed with family there may not be curative treatment for this patient given metastasis but will have oncology give final recommendations. Biopsy pending. Will get PT/OT on-board. Once further information comes in, will consult palliative care to assist with GOC.  Best Practice (right click and "Reselect all SmartList Selections" daily)  Diet/type: NPO DVT prophylaxis: Systemic Heparin GI prophylaxis: None Lines: N/A and No longer needed.  Order written to d/c  Foley:  Yes, and it is still needed Continuous: Levophed Code Status:  full code Last date of multidisciplinary goals of care discussion [Plan for 12/03]

## 2023-11-28 NOTE — Progress Notes (Signed)
Called sister Dewayne Hatch twice for blood consent at [phone] but sent to voicemail twice. Left message to call back at [phone].

## 2023-11-28 NOTE — Consult Note (Addendum)
Chief Complaint: Patient was seen in consultation today for left abdominal wall mass biopsy Chief Complaint  Patient presents with   Fall   at the request of Dr Adrian Saran   Supervising Physician: Marliss Coots  Patient Status: [facility] - In-pt  History of Present Illness: KIM BOLON is a 72 y.o. male  Full Code status per chart and family via phone Hx Prostate cancer/radical prostatectomy 2020 Follows with Dr Ellin Saba CT cervical spine/head/A/P and maxillofacial done 11/26/23.  Shows large lytic lesion T1 likely mets with likely epidural spread of tumor along dorsal aspect of spinal canal at T1.  Also demonstrates multiple enlarged cervical lymph nodes in neck concerning for nodal mets; bil adrenal masses, multiple bone mets.  -Ordered CT chest, will follow results. Suspect Lung Ca.   CT yesterday: IMPRESSION: 1. Right hilar mass/adenopathy as well as mediastinal, axillary, and right supraclavicular adenopathy. 2. Debris in the bronchus intermedius and right lower lobe bronchus with post obstructive atelectasis or infiltrate of the majority of the right lower lobe. 3. Partial consolidation of the left lower lobe. 4. Right adrenal metastasis. 5. Osseous lytic metastasis with associated pathologic fractures as above.  Request made for biopsy-- tissue diagnosis Dr Elby Showers has reviewed imaging and approves left abd wall mass biopsy Scheduled now for left abd wall mass biopsy      Past Medical History:  Diagnosis Date   Family history of metabolic acidosis with increased anion gap 03/05/2023   GERD (gastroesophageal reflux disease)    History of kidney stones    Hx of AKA (above knee amputation), right (HCC)    Hyperlipidemia    Hypertension    Metastatic disease (HCC)    PAD (peripheral artery disease) (HCC)    S/p R CFA and PTA bypass 11/2022, critical limb ischemia s/p R AKA 02/2023   Prostate cancer [facility]) 2011   radiation june 2020   Septic shock (HCC)  11/26/2023   Umbilical hernia    Wears dentures    full   Wears glasses    Wears partial dentures    lower    Past Surgical History:  Procedure Laterality Date   ABDOMINAL AORTOGRAM W/LOWER EXTREMITY N/A 11/20/2022   Procedure: ABDOMINAL AORTOGRAM W/LOWER EXTREMITY;  Surgeon: Maeola Harman, MD;  Location: [facility] INVASIVE CV LAB;  Service: Cardiovascular;  Laterality: N/A;   ABDOMINAL AORTOGRAM W/LOWER EXTREMITY N/A 03/05/2023   Procedure: ABDOMINAL AORTOGRAM W/LOWER EXTREMITY;  Surgeon: Maeola Harman, MD;  Location: [facility] INVASIVE CV LAB;  Service: Cardiovascular;  Laterality: N/A;   AMPUTATION Right 03/09/2023   Procedure: AMPUTATION ABOVE KNEE;  Surgeon: Maeola Harman, MD;  Location: [facility] OR;  Service: Vascular;  Laterality: Right;   COLONOSCOPY N/A 08/25/2020   Procedure: COLONOSCOPY;  Surgeon: Corbin Ade, MD;  Location: [facility] ENDO SUITE;  Service: Endoscopy;  Laterality: N/A;  9:30   COLONOSCOPY     CYSTOSCOPY WITH LITHOLAPAXY N/A 05/18/2021   Procedure: CYSTOSCOPY WITH LITHOLAPAXY;  Surgeon: Rene Paci, MD;  Location: [facility];  Service: Urology;  Laterality: N/A;  ONLY NEEDS  30 MIN   FEMORAL-TIBIAL BYPASS GRAFT Right 12/12/2022   Procedure: RIGHT COMMON FEMORAL-POSTERIOR TIBIAL ARTERY BYPASS WITH VEIN HARVESTING OF THE GREATER SAPHENOUS VEIN AND FEMORAL ENDARTERECTOMY WITH COMPOSITE GRAFT;  Surgeon: Maeola Harman, MD;  Location: [facility] OR;  Service: Vascular;  Laterality: Right;   POLYPECTOMY  08/25/2020   Procedure: POLYPECTOMY;  Surgeon: Corbin Ade, MD;  Location: [facility] ENDO SUITE;  Service:  Endoscopy;;   ROBOT ASSISTED LAPAROSCOPIC RADICAL PROSTATECTOMY  2011   TONSILLECTOMY  age 75   and adenoids removed    Allergies: Shellfish allergy  Medications: Prior to Admission medications   Medication Sig Start Date End Date Taking? Authorizing Provider  acetaminophen (TYLENOL) 325 MG tablet Take 1-2 tablets  (325-650 mg total) by mouth every 4 (four) hours as needed for mild pain. 03/26/23   Love, Evlyn Kanner, PA-C  albuterol (VENTOLIN HFA) 108 (90 Base) MCG/ACT inhaler Inhale 2 puffs into the lungs every 6 (six) hours as needed for wheezing or shortness of breath. 10/19/23   Del Nigel Berthold, FNP  aspirin EC 81 MG tablet Take 81 mg by mouth daily.    [provider]  atorvastatin (LIPITOR) 40 MG tablet Take 1 tablet (40 mg total) by mouth every morning. 04/05/23   Maeola Harman, MD  benzonatate (TESSALON) 200 MG capsule Take 1 capsule (200 mg total) by mouth 2 (two) times daily as needed for cough. 10/10/23   Del Nigel Berthold, FNP  cyclobenzaprine (FLEXERIL) 5 MG tablet Take 1 tablet (5 mg total) by mouth 3 (three) times daily as needed for muscle spasms. 04/25/23   Lovorn, Aundra Millet, MD  gabapentin (NEURONTIN) 100 MG capsule Take 2 capsules (200 mg total) by mouth 2 (two) times daily. Along with 300 mg nightly- for phantom pain- due to R AKA 11/16/23   Lovorn, Aundra Millet, MD  gabapentin (NEURONTIN) 300 MG capsule Take 1 capsule by mouth at bedtime 10/30/23   Lovorn, Aundra Millet, MD  iron polysaccharides (NIFEREX) 150 MG capsule Take 1 capsule (150 mg total) by mouth daily. 10/19/23   Del Nigel Berthold, FNP  levocetirizine (XYZAL) 5 MG tablet Take 1 tablet (5 mg total) by mouth every evening. 05/24/23   Del Nigel Berthold, FNP  lisinopril-hydrochlorothiazide (ZESTORETIC) 10-12.5 MG tablet Take 1 tablet by mouth daily. 10/10/23   Del Nigel Berthold, FNP  metoprolol tartrate (LOPRESSOR) 25 MG tablet Take 0.5 tablets (12.5 mg total) by mouth 2 (two) times daily. Will give 1 month supply- and get from NP/PCP in future. 10/10/23   Del Nigel Berthold, FNP  Multiple Vitamins-Minerals (CENTRUM SILVER PO) Take 1 tablet by mouth daily.     [provider]  mupirocin ointment (BACTROBAN) 2 % Apply 1 Application topically 2 (two) times daily. 05/24/23   Del Nigel Berthold, FNP  oxybutynin (DITROPAN-XL) 10 MG 24 hr tablet Take 10 mg by mouth daily. 04/12/23   [provider]  Oxycodone HCl 10 MG TABS Take 1 tablet (10 mg total) by mouth 3 (three) times daily as needed. 03/26/23   Love, Evlyn Kanner, PA-C  pantoprazole (PROTONIX) 40 MG tablet Take 1 tablet (40 mg total) by mouth daily. 03/26/23   Love, Evlyn Kanner, PA-C  polyethylene glycol powder (MIRALAX) 17 GM/SCOOP powder Take 17 g by mouth daily as needed for mild constipation. 10/10/23   Del Nigel Berthold, FNP  senna-docusate (SENOKOT-S) 8.6-50 MG tablet Take 2 tablets by mouth daily after supper. 03/26/23   Love, Evlyn Kanner, PA-C  sodium bicarbonate 650 MG tablet Take 1 tablet (650 mg total) by mouth 2 (two) times daily. 03/26/23   LoveEvlyn Kanner, PA-C     Family History  Problem Relation Age of Onset   Gastric cancer Mother    Prostate cancer Father    Breast cancer Neg Hx    Pancreatic cancer Neg Hx     Social History  Socioeconomic History   Marital status: Single    Spouse name: Not on file   Number of children: 1   Years of education: Not on file   Highest education level: Some college, no degree  Occupational History    Comment: customer service   Tobacco Use   Smoking status: Former    Current packs/day: 0.00    Average packs/day: 0.3 packs/day for 21.0 years (5.3 ttl pk-yrs)    Types: Cigarettes    Start date: 11/29/2001    Quit date: 11/29/2022    Years since quitting: 0.9    Passive exposure: Never   Smokeless tobacco: Never  Vaping Use   Vaping status: Never Used  Substance and Sexual Activity   Alcohol use: Never   Drug use: Never   Sexual activity: Not Currently  Other Topics Concern   Not on file  Social History Narrative   Has one son.    Social Determinants of Health   Financial Resource Strain: Medium Risk (10/09/2023)   Overall Financial Resource Strain (CARDIA)    Difficulty of Paying Living Expenses: Somewhat hard  Food Insecurity: Patient Unable To  Answer (11/26/2023)   Hunger Vital Sign    Worried About Running Out of Food in the Last Year: Patient unable to answer    Ran Out of Food in the Last Year: Patient unable to answer  Recent Concern: Food Insecurity - Food Insecurity Present (10/09/2023)   Hunger Vital Sign    Worried About Running Out of Food in the Last Year: Sometimes true    Ran Out of Food in the Last Year: Sometimes true  Transportation Needs: No Transportation Needs (10/09/2023)   PRAPARE - Administrator, Civil Service (Medical): No    Lack of Transportation (Non-Medical): No  Physical Activity: Unknown (10/09/2023)   Exercise Vital Sign    Days of Exercise per Week: 0 days    Minutes of Exercise per Session: Not on file  Stress: No Stress Concern Present (10/09/2023)   [employer] of Occupational Health - Occupational Stress Questionnaire    Feeling of Stress : Not at all  Social Connections: Moderately Integrated (10/09/2023)   Social Connection and Isolation Panel [NHANES]    Frequency of Communication with Friends and Family: More than three times a week    Frequency of Social Gatherings with Friends and Family: Never    Attends Religious Services: 1 to 4 times per year    Active Member of [employer] or Organizations: Yes    Attends Banker Meetings: Patient declined    Marital Status: Never married    Review of Systems: A 12 point ROS discussed and pertinent positives are indicated in the HPI above.  All other systems are negative.  Review of Systems  Constitutional:  Positive for activity change and fatigue.  Respiratory:  Negative for shortness of breath.   Gastrointestinal:  Positive for abdominal pain and nausea.  Musculoskeletal:  Positive for back pain and gait problem.  Psychiatric/Behavioral:  Positive for decreased concentration. Negative for behavioral problems and confusion.     Vital Signs: BP (!) 110/57   Pulse 88   Temp 98.7 F (37.1 C) (Oral)   Resp 11    Ht 5\' 10"  (1.778 m)   Wt 203 lb 11.3 oz (92.4 kg)   SpO2 96%   BMI 29.23 kg/m    Physical Exam Vitals reviewed.  HENT:     Mouth/Throat:     Mouth: Mucous membranes are  moist.  Cardiovascular:     Rate and Rhythm: Normal rate and regular rhythm.  Pulmonary:     Effort: Pulmonary effort is normal.     Breath sounds: Normal breath sounds. No wheezing.  Abdominal:     Tenderness: There is abdominal tenderness.  Skin:    General: Skin is warm.  Neurological:     Mental Status: He is alert and oriented to person, place, and time.  Psychiatric:        Behavior: Behavior normal.     Comments: Unable to sign consent Weakness Discussed with sister Dewayne Hatch via phone-- she consents to procedure     Imaging: MR THORACIC SPINE W WO CONTRAST  Result Date: 11/28/2023 CLINICAL DATA:  Metastatic disease evaluation EXAM: MRI THORACIC WITHOUT AND WITH CONTRAST TECHNIQUE: Multiplanar and multiecho pulse sequences of the thoracic spine were obtained without and with intravenous contrast. CONTRAST:  9mL GADAVIST GADOBUTROL 1 MMOL/ML IV SOLN COMPARISON:  None Available. FINDINGS: Alignment:  Physiologic. Vertebrae: Soft tissue mass replacing the T1 spinous process. Metastatic lesions of the vertebral bodies at T5, T6, T10 and L1. The T10 lesion extends into the right articular pillar. There is a pathologic fracture at the posterior aspect of the T10 vertebral body with approximately 25% height loss. Cord:  Normal signal and morphology. Paraspinal and other soft tissues: There are nodular soft tissue lesions superior to both scapulae. There is an additional soft tissue nodule within the posterior subcutaneous fat at the L1 level. Disc levels: Axial images are markedly motion degraded. There is severe spinal canal stenosis at the T1 level secondary to dorsal mass effect from the soft tissue mass replacing the spinous process. There is mild spinal canal stenosis at the T10 level due to posterior bulging of the  vertebral body lesion. IMPRESSION: 1. Soft tissue mass replacing the T1 spinous process causing severe spinal canal stenosis. 2. Metastatic lesions of the vertebral bodies at T5, T6, T10 and L1. 3. Pathologic fracture at the posterior aspect of the T10 vertebral body with approximately 25% height loss. 4. Nodular soft tissue lesions superior to both scapulae and within the posterior subcutaneous fat at the L1 level, consistent with metastatic disease. Electronically Signed   By: Deatra Robinson M.D.   On: 11/28/2023 02:56   CT CHEST WO CONTRAST  Result Date: 11/27/2023 CLINICAL DATA:  Concern for lung cancer and metastatic disease. Interstitial lung disease. EXAM: CT CHEST WITHOUT CONTRAST TECHNIQUE: Multidetector CT imaging of the chest was performed following the standard protocol without IV contrast. RADIATION DOSE REDUCTION: This exam was performed according to the departmental dose-optimization program which includes automated exposure control, adjustment of the mA and/or kV according to patient size and/or use of iterative reconstruction technique. COMPARISON:  Chest CT dated 11/12/2023. FINDINGS: Evaluation of this exam is limited in the absence of intravenous contrast. Cardiovascular: Top-normal cardiac size. No pericardial effusion. Three-vessel coronary vascular calcification. Moderate atherosclerotic calcification of the thoracic aorta. No aneurysmal dilatation. The central pulmonary arteries are grossly unremarkable. Mediastinum/Nodes: Subcarinal adenopathy measuring 25 mm short axis. Right hilar mass/adenopathy measures 29 mm in short axis. Mediastinal adenopathy anterior to the carina measures 15 mm short axis. Additional upper mediastinal and right supraclavicular adenopathy as seen on the prior CT. No mediastinal fluid collection. Lungs/Pleura: Debris is noted in the bronchus intermedius extending into the right lower lobe bronchus. There is consolidative changes of the majority of the right lower  lobe consistent with post obstructive atelectasis or infiltrate. There is partial consolidation of the  left lower lobe. No pleural effusion or pneumothorax. Upper Abdomen: Right adrenal thickening consistent with metastasis. Musculoskeletal: Scattered osseous lytic metastasis involving the T5, T6, and T10 with associated pathologic fracture as seen previously. Extension of the metastasis at T10 to the central spinal with focal narrowing of the central canal. Additional lytic metastasis involving bilateral ribs. The largest lesion measures approximately 4.8 x 2.5 cm involving the anterior right fifth rib with associated destruction of the rib. Bilateral axillary adenopathy measure up to 17 mm in short axis on the left. IMPRESSION: 1. Right hilar mass/adenopathy as well as mediastinal, axillary, and right supraclavicular adenopathy. 2. Debris in the bronchus intermedius and right lower lobe bronchus with post obstructive atelectasis or infiltrate of the majority of the right lower lobe. 3. Partial consolidation of the left lower lobe. 4. Right adrenal metastasis. 5. Osseous lytic metastasis with associated pathologic fractures as above. 6.  Aortic Atherosclerosis (ICD10-I70.0). Electronically Signed   By: Elgie Collard M.D.   On: 11/27/2023 19:26   VAS [place] LOWER EXTREMITY VENOUS (DVT)  Result Date: 11/27/2023  Lower Venous DVT Study Patient Name:  PANTH HIEB  Date of Exam:   11/27/2023 Medical Rec #: [id number]            Accession #:    [id number] Date of Birth: 13-Jul-1951            Patient Gender: M Patient Age:   77 years Exam Location:  [facility] Procedure:      VAS [place] LOWER EXTREMITY VENOUS (DVT) Referring Phys: JESSICA MARSHALL --------------------------------------------------------------------------------  Indications: Swelling, and Recent fall. History of right AKA on 03/09/23.  Risk Factors: Surgery History of right common femoral to posterior tibial artery bypass on 12/12/22.  Comparison Study: No priors. Performing Technologist: Marilynne Halsted RDMS, RVT  Examination Guidelines: A complete evaluation includes B-mode imaging, spectral Doppler, color Doppler, and power Doppler as needed of all accessible portions of each vessel. Bilateral testing is considered an integral part of a complete examination. Limited examinations for reoccurring indications may be performed as noted. The reflux portion of the exam is performed with the patient in reverse Trendelenburg.  +---------+---------------+---------+-----------+----------+-------------------+ RIGHT    CompressibilityPhasicitySpontaneityPropertiesThrombus Aging      +---------+---------------+---------+-----------+----------+-------------------+ CFV      Partial        Yes      Yes                  Chronic             +---------+---------------+---------+-----------+----------+-------------------+ FV Prox  Partial                                      Chronic             +---------+---------------+---------+-----------+----------+-------------------+ FV Mid   Partial                                      Chronic             +---------+---------------+---------+-----------+----------+-------------------+ FV Distal                                             Not well visualized +---------+---------------+---------+-----------+----------+-------------------+ PFV  Partial                                                          +---------+---------------+---------+-----------+----------+-------------------+ POP                                                   AKA                 +---------+---------------+---------+-----------+----------+-------------------+ Occluded bypass graft.  +---------+---------------+---------+-----------+----------+-----------------+ LEFT     CompressibilityPhasicitySpontaneityPropertiesThrombus Aging     +---------+---------------+---------+-----------+----------+-----------------+ CFV      Full           Yes      Yes                                    +---------+---------------+---------+-----------+----------+-----------------+ SFJ      Full                                                           +---------+---------------+---------+-----------+----------+-----------------+ FV Prox  Full                                                           +---------+---------------+---------+-----------+----------+-----------------+ FV Mid   Full                                                           +---------+---------------+---------+-----------+----------+-----------------+ FV DistalPartial        Yes      Yes                  Age Indeterminate +---------+---------------+---------+-----------+----------+-----------------+ PFV      Full                                                           +---------+---------------+---------+-----------+----------+-----------------+ POP      Partial        No       No                   Age Indeterminate +---------+---------------+---------+-----------+----------+-----------------+ PTV      None                                         Age Indeterminate +---------+---------------+---------+-----------+----------+-----------------+ PERO     None  Age Indeterminate +---------+---------------+---------+-----------+----------+-----------------+     Summary: RIGHT: - Findings consistent with chronic deep vein thrombosis involving the right femoral vein.  LEFT: - Findings consistent with age indeterminate deep vein thrombosis involving the left femoral vein, left popliteal vein, left posterior tibial veins, and left peroneal veins.   *See table(s) above for measurements and observations. Electronically signed by Lemar Livings MD on 11/27/2023 at 5:15:26 PM.    Final    ECHOCARDIOGRAM  COMPLETE  Result Date: 11/27/2023    ECHOCARDIOGRAM REPORT   Patient Name:   KERWIN POEHLMAN Date of Exam: 11/27/2023 Medical Rec #:  [id number]           Height:       70.0 in Accession #:    [id number]          Weight:       201.5 lb Date of Birth:  05/09/1951           BSA:          2.094 m Patient Age:    72 years            BP:           105/57 mmHg Patient Gender: M                   HR:           90 bpm. Exam Location:  Inpatient Procedure: 2D Echo, Cardiac Doppler and Color Doppler Indications:    Shock R57.9  History:        Patient has no prior history of Echocardiogram examinations. CKD                 and PAD; Risk Factors:Hypertension and Former Smoker.  Sonographer:    Dondra Prader RVT RCS Referring Phys: JESSICA MARSHALL  Sonographer Comments: Image acquisition challenging due to uncooperative patient and Image acquisition challenging due to respiratory motion. Patient unable to reposition; Echo done with patient supine. Unable to do breath holds. IMPRESSIONS  1. Left ventricular ejection fraction, by estimation, is 60 to 65%. The left ventricle has normal function. The left ventricle has no regional wall motion abnormalities. There is mild left ventricular hypertrophy. Left ventricular diastolic parameters were normal.  2. Right ventricular systolic function is hyperdynamic. The right ventricular size is normal. Tricuspid regurgitation signal is inadequate for assessing PA pressure.  3. The mitral valve is normal in structure. No evidence of mitral valve regurgitation. No evidence of mitral stenosis.  4. Abnormal echodensity seen in the right atrium only in subcostal view. This is superior to the tricuspid valve annulus. Cannot exclude normal variant anatomy, only seen in clip 65. Not seen in recent CTPE for comparison. The tricuspid valve is abnormal.  5. The aortic valve is tricuspid. Aortic valve regurgitation is mild to moderate. Aortic valve sclerosis is present, with no evidence of aortic  valve stenosis.  6. The inferior vena cava is dilated in size with <50% respiratory variability, suggesting right atrial pressure of 15 mmHg. Comparison(s): No prior Echocardiogram. FINDINGS  Left Ventricle: Left ventricular ejection fraction, by estimation, is 60 to 65%. The left ventricle has normal function. The left ventricle has no regional wall motion abnormalities. The left ventricular internal cavity size was normal in size. There is  mild left ventricular hypertrophy. Left ventricular diastolic parameters were normal. Right Ventricle: The right ventricular size is normal. No increase in right ventricular wall thickness. Right ventricular systolic function is hyperdynamic. Tricuspid regurgitation signal is  inadequate for assessing PA pressure. Left Atrium: Left atrial size was normal in size. Right Atrium: Right atrial size was normal in size. Pericardium: There is no evidence of pericardial effusion. Mitral Valve: The mitral valve is normal in structure. No evidence of mitral valve regurgitation. No evidence of mitral valve stenosis. Tricuspid Valve: Abnormal echodensity seen in the right atrium only in subcostal view. This is superior to the tricuspid valve annulus. Cannot exclude normal variant anatomy, only seen in clip 65. Not seen in recent CTPE for comparison. The tricuspid valve is abnormal. Tricuspid valve regurgitation is not demonstrated. No evidence of tricuspid stenosis. Aortic Valve: The aortic valve is tricuspid. There is mild aortic valve annular calcification. Aortic valve regurgitation is mild to moderate. Aortic regurgitation PHT measures 360 msec. Aortic valve sclerosis is present, with no evidence of aortic valve  stenosis. Aortic valve mean gradient measures 5.0 mmHg. Aortic valve peak gradient measures 9.1 mmHg. Aortic valve area, by VTI measures 2.41 cm. Pulmonic Valve: The pulmonic valve was normal in structure. Pulmonic valve regurgitation is trivial. No evidence of pulmonic  stenosis. Aorta: The aortic root and ascending aorta are structurally normal, with no evidence of dilitation. Venous: The inferior vena cava is dilated in size with less than 50% respiratory variability, suggesting right atrial pressure of 15 mmHg. IAS/Shunts: No atrial level shunt detected by color flow Doppler.  LEFT VENTRICLE PLAX 2D LVIDd:         4.20 cm   Diastology LVIDs:         2.60 cm   LV e' medial:    8.70 cm/s LV PW:         1.10 cm   LV E/e' medial:  9.9 LV IVS:        1.20 cm   LV e' lateral:   10.10 cm/s LVOT diam:     2.00 cm   LV E/e' lateral: 8.5 LV SV:         63 LV SV Index:   30 LVOT Area:     3.14 cm  RIGHT VENTRICLE             IVC RV S prime:     12.20 cm/s  IVC diam: 2.15 cm TAPSE (M-mode): 2.8 cm LEFT ATRIUM             Index        RIGHT ATRIUM           Index LA diam:        2.90 cm 1.38 cm/m   RA Area:     11.80 cm LA Vol (A2C):   30.1 ml 14.37 ml/m  RA Volume:   27.10 ml  12.94 ml/m LA Vol (A4C):   20.0 ml 9.55 ml/m LA Biplane Vol: 27.0 ml 12.89 ml/m  AORTIC VALVE                     PULMONIC VALVE AV Area (Vmax):    2.33 cm      PV Vmax:       1.04 m/s AV Area (Vmean):   2.38 cm      PV Peak grad:  4.3 mmHg AV Area (VTI):     2.41 cm AV Vmax:           151.00 cm/s AV Vmean:          100.000 cm/s AV VTI:            0.261 m AV Peak Grad:  9.1 mmHg AV Mean Grad:      5.0 mmHg LVOT Vmax:         112.00 cm/s LVOT Vmean:        75.600 cm/s LVOT VTI:          0.200 m LVOT/AV VTI ratio: 0.77 AI PHT:            360 msec AR Vena Contracta: 0.50 cm  AORTA Ao Root diam: 3.40 cm Ao Asc diam:  3.30 cm MITRAL VALVE MV Area (PHT): 4.63 cm    SHUNTS MV Decel Time: 164 msec    Systemic VTI:  0.20 m MV E velocity: 85.90 cm/s  Systemic Diam: 2.00 cm MV A velocity: 88.00 cm/s MV E/A ratio:  0.98 Riley Lam MD Electronically signed by Riley Lam MD Signature Date/Time: 11/27/2023/9:13:01 AM    Final    CT ABDOMEN PELVIS W CONTRAST  Result Date: 11/26/2023 CLINICAL  DATA:  Increasing weakness for 2-3 days with recent fall. Scrotal mass or lump, scrotal wound, concern for abscess versus deep space infection. Evidence of metastatic disease on recent chest CTA. * Tracking Code: BO * EXAM: CT ABDOMEN AND PELVIS WITH CONTRAST TECHNIQUE: Multidetector CT imaging of the abdomen and pelvis was performed using the standard protocol following bolus administration of intravenous contrast. RADIATION DOSE REDUCTION: This exam was performed according to the departmental dose-optimization program which includes automated exposure control, adjustment of the mA and/or kV according to patient size and/or use of iterative reconstruction technique. CONTRAST:  75mL OMNIPAQUE IOHEXOL 300 MG/ML  SOLN COMPARISON:  Noncontrast CT of the abdomen and pelvis 03/05/2019. Chest CTA 11/12/2023. FINDINGS: Lower chest: Mild dependent atelectasis at both lung bases. No significant pleural or pericardial effusion. There is aortic and coronary artery atherosclerosis. Partially imaged mass again noted involving the anterior aspect of the right 5th rib, measuring up to 3.8 x 2.9 cm on image 2/1. Hepatobiliary: The liver is normal in density without suspicious focal abnormality. Subcentimeter low-density lesion inferiorly in the right hepatic lobe on image 26/1 is unchanged from remote abdominal CT, likely a cyst. No evidence of gallstones, gallbladder wall thickening or biliary dilatation. Pancreas: Unremarkable. No pancreatic ductal dilatation or surrounding inflammatory changes. Spleen: Normal in size without focal abnormality. Adrenals/Urinary Tract: As seen on recent chest CT, new bilateral adrenal masses consistent with metastatic disease. Right adrenal nodule measures 3.0 x 2.0 cm on image 20/1 and left adrenal nodule measures 2.3 x 2.2 cm on image 24/1. No evidence of urinary tract calculus, suspicious renal lesion or hydronephrosis. There are renal cysts bilaterally which are similar to remote abdominal CT.  The bladder appears unremarkable for its degree of distention. Stomach/Bowel: No definite enteric contrast administered. There is high density colonic stool. The stomach appears unremarkable for its degree of distension. No evidence of bowel wall thickening, distention or surrounding inflammatory change. The appendix appears normal. Vascular/Lymphatic: There are no enlarged abdominal or pelvic lymph nodes. Aortic and branch vessel atherosclerosis without evidence of aneurysm or large vessel occlusion in the abdomen or pelvis. Postsurgical changes at the right groin consistent with femoral arterial bypass grafting. There is low-density within the femoral graft suspicious for occlusion. In addition, there is low-density in the right femoral vein which could reflect a DVT. No intrapelvic extension identified. Reproductive: Status post prostatectomy. No recurrent mass lesion identified. Other: As partially imaged on recent chest CT, there are multiple abdominal wall masses consistent with metastatic disease. Representative lesions include a 3.4 cm mass posteriorly in the  left mid abdominal wall on image 33/1, a 3.2 x 2.2 cm mass in the left anterior abdominal wall on image 42/1 and a 2.9 cm right suprapubic mass on image 84/1. No obvious scrotal mass or fluid collection. In addition to the subcutaneous lesions, there are 2 retroperitoneal masses on the right, largest measuring 1.7 cm on image 44/1. There is a supraumbilical hernia containing only fat. No ascites or free air. Musculoskeletal: As above, multiple subcutaneous metastases. In addition, as seen on recent chest CT, there is a large destructive mass involving the left aspect of the T10 vertebral body, measuring 3.5 x 3.0 cm on image 8/1. This mass is associated with intraspinal extension and possible thoracic cord compression, as before. There are other smaller osseous metastases within the L1 and L4 vertebral bodies, the right superior pubic ramus, the left  superior acetabulum and the left femoral greater trochanter. IMPRESSION: 1. Findings are consistent with metastatic disease, including multiple subcutaneous metastases, bilateral adrenal masses, right retroperitoneal masses and multiple osseous metastases. 2. The largest osseous metastasis involves the T10 vertebral body with intraspinal extension and possible thoracic cord compression, as on previous chest CTA. Consider thoracic spine MRI for further evaluation. 3. No obvious scrotal mass or fluid collection. 4. Postsurgical changes at the right groin consistent with femoral arterial bypass grafting. Low-density within the femoral graft is suspicious for occlusion. In addition, there is low-density in the right femoral vein which could reflect a DVT or sequela of poor inflow. No intrapelvic extension identified. 5.  Aortic Atherosclerosis (ICD10-I70.0). Electronically Signed   By: Carey Bullocks M.D.   On: 11/26/2023 15:35   CT HEAD WO CONTRAST ( )  Result Date: 11/26/2023 CLINICAL DATA:  Head trauma, minor (Age >= 65y); Facial trauma, blunt; Neck trauma (Age >= 65y) EXAM: CT HEAD WITHOUT CONTRAST CT MAXILLOFACIAL WITHOUT CONTRAST CT CERVICAL SPINE WITHOUT CONTRAST TECHNIQUE: Multidetector CT imaging of the head, cervical spine, and maxillofacial structures were performed using the standard protocol without intravenous contrast. Multiplanar CT image reconstructions of the cervical spine and maxillofacial structures were also generated. RADIATION DOSE REDUCTION: This exam was performed according to the departmental dose-optimization program which includes automated exposure control, adjustment of the mA and/or kV according to patient size and/or use of iterative reconstruction technique. COMPARISON:  CT Chest 11/12/23 FINDINGS: CT HEAD FINDINGS Brain: No hemorrhage. No hydrocephalus. No extra-axial fluid collection. No CT evidence of cortical infarct. No mass effect. No mass lesion. Vascular: No hyperdense  vessel or unexpected calcification. Skull: Normal. Negative for fracture or focal lesion. Other: Soft tissue hematoma along the left frontal scalp CT MAXILLOFACIAL FINDINGS Osseous: No fracture or mandibular dislocation. No destructive process. Orbits: Negative. No traumatic or inflammatory finding. Sinuses: No middle ear or mastoid effusion. Paranasal sinuses are notable for mucosal thickening left maxillary sinus. Postsurgical changes from prior left maxillary antrostomy. Soft tissues: Soft tissue swelling in the periorbital soft tissues on the left. CT CERVICAL SPINE FINDINGS Alignment: Normal. Skull base and vertebrae: No acute fracture. There is a large lytic lesion involving the T1 spinous process and lamina, worrisome for osseous metastatic disease. Soft tissues and spinal canal: There are multiple enlarged cervical lymph nodes predominantly in the right neck, for example a 2.1 cm right level 3 lymph node (series 5, image 52) and a 2.1 cm right paraesophageal lymph node. Disc levels: There is likely epidural spread of tumor along the dorsal aspect of the spinal canal at the T1 level (series 5, image 63). Upper chest: See separately dictated  CT chest abdomen and pelvis for additional findings Other: No IMPRESSION: 1. No acute intracranial abnormality. 2. No acute facial bone fracture. 3. No acute fracture or traumatic listhesis of the cervical spine. 4. Large lytic lesion involving the T1 spinous process and lamina, worrisome for osseous metastatic disease. There is likely epidural spread of tumor along the dorsal aspect of the spinal canal at the T1 level. This is unchanged from 11/12/23 5. Multiple enlarged cervical lymph nodes, predominantly in the right neck, worrisome for nodal metastatic disease. 6. Soft tissue hematoma along the left frontal scalp. No underlying calvarial fracture. Electronically Signed   By: Lorenza Cambridge M.D.   On: 11/26/2023 13:59   CT CERVICAL SPINE WO CONTRAST  Result Date:  11/26/2023 CLINICAL DATA:  Head trauma, minor (Age >= 65y); Facial trauma, blunt; Neck trauma (Age >= 65y) EXAM: CT HEAD WITHOUT CONTRAST CT MAXILLOFACIAL WITHOUT CONTRAST CT CERVICAL SPINE WITHOUT CONTRAST TECHNIQUE: Multidetector CT imaging of the head, cervical spine, and maxillofacial structures were performed using the standard protocol without intravenous contrast. Multiplanar CT image reconstructions of the cervical spine and maxillofacial structures were also generated. RADIATION DOSE REDUCTION: This exam was performed according to the departmental dose-optimization program which includes automated exposure control, adjustment of the mA and/or kV according to patient size and/or use of iterative reconstruction technique. COMPARISON:  CT Chest 11/12/23 FINDINGS: CT HEAD FINDINGS Brain: No hemorrhage. No hydrocephalus. No extra-axial fluid collection. No CT evidence of cortical infarct. No mass effect. No mass lesion. Vascular: No hyperdense vessel or unexpected calcification. Skull: Normal. Negative for fracture or focal lesion. Other: Soft tissue hematoma along the left frontal scalp CT MAXILLOFACIAL FINDINGS Osseous: No fracture or mandibular dislocation. No destructive process. Orbits: Negative. No traumatic or inflammatory finding. Sinuses: No middle ear or mastoid effusion. Paranasal sinuses are notable for mucosal thickening left maxillary sinus. Postsurgical changes from prior left maxillary antrostomy. Soft tissues: Soft tissue swelling in the periorbital soft tissues on the left. CT CERVICAL SPINE FINDINGS Alignment: Normal. Skull base and vertebrae: No acute fracture. There is a large lytic lesion involving the T1 spinous process and lamina, worrisome for osseous metastatic disease. Soft tissues and spinal canal: There are multiple enlarged cervical lymph nodes predominantly in the right neck, for example a 2.1 cm right level 3 lymph node (series 5, image 52) and a 2.1 cm right paraesophageal lymph  node. Disc levels: There is likely epidural spread of tumor along the dorsal aspect of the spinal canal at the T1 level (series 5, image 63). Upper chest: See separately dictated CT chest abdomen and pelvis for additional findings Other: No IMPRESSION: 1. No acute intracranial abnormality. 2. No acute facial bone fracture. 3. No acute fracture or traumatic listhesis of the cervical spine. 4. Large lytic lesion involving the T1 spinous process and lamina, worrisome for osseous metastatic disease. There is likely epidural spread of tumor along the dorsal aspect of the spinal canal at the T1 level. This is unchanged from 11/12/23 5. Multiple enlarged cervical lymph nodes, predominantly in the right neck, worrisome for nodal metastatic disease. 6. Soft tissue hematoma along the left frontal scalp. No underlying calvarial fracture. Electronically Signed   By: Lorenza Cambridge M.D.   On: 11/26/2023 13:59   CT Maxillofacial Wo Contrast  Result Date: 11/26/2023 CLINICAL DATA:  Head trauma, minor (Age >= 65y); Facial trauma, blunt; Neck trauma (Age >= 65y) EXAM: CT HEAD WITHOUT CONTRAST CT MAXILLOFACIAL WITHOUT CONTRAST CT CERVICAL SPINE WITHOUT CONTRAST TECHNIQUE: Multidetector CT imaging  of the head, cervical spine, and maxillofacial structures were performed using the standard protocol without intravenous contrast. Multiplanar CT image reconstructions of the cervical spine and maxillofacial structures were also generated. RADIATION DOSE REDUCTION: This exam was performed according to the departmental dose-optimization program which includes automated exposure control, adjustment of the mA and/or kV according to patient size and/or use of iterative reconstruction technique. COMPARISON:  CT Chest 11/12/23 FINDINGS: CT HEAD FINDINGS Brain: No hemorrhage. No hydrocephalus. No extra-axial fluid collection. No CT evidence of cortical infarct. No mass effect. No mass lesion. Vascular: No hyperdense vessel or unexpected  calcification. Skull: Normal. Negative for fracture or focal lesion. Other: Soft tissue hematoma along the left frontal scalp CT MAXILLOFACIAL FINDINGS Osseous: No fracture or mandibular dislocation. No destructive process. Orbits: Negative. No traumatic or inflammatory finding. Sinuses: No middle ear or mastoid effusion. Paranasal sinuses are notable for mucosal thickening left maxillary sinus. Postsurgical changes from prior left maxillary antrostomy. Soft tissues: Soft tissue swelling in the periorbital soft tissues on the left. CT CERVICAL SPINE FINDINGS Alignment: Normal. Skull base and vertebrae: No acute fracture. There is a large lytic lesion involving the T1 spinous process and lamina, worrisome for osseous metastatic disease. Soft tissues and spinal canal: There are multiple enlarged cervical lymph nodes predominantly in the right neck, for example a 2.1 cm right level 3 lymph node (series 5, image 52) and a 2.1 cm right paraesophageal lymph node. Disc levels: There is likely epidural spread of tumor along the dorsal aspect of the spinal canal at the T1 level (series 5, image 63). Upper chest: See separately dictated CT chest abdomen and pelvis for additional findings Other: No IMPRESSION: 1. No acute intracranial abnormality. 2. No acute facial bone fracture. 3. No acute fracture or traumatic listhesis of the cervical spine. 4. Large lytic lesion involving the T1 spinous process and lamina, worrisome for osseous metastatic disease. There is likely epidural spread of tumor along the dorsal aspect of the spinal canal at the T1 level. This is unchanged from 11/12/23 5. Multiple enlarged cervical lymph nodes, predominantly in the right neck, worrisome for nodal metastatic disease. 6. Soft tissue hematoma along the left frontal scalp. No underlying calvarial fracture. Electronically Signed   By: Lorenza Cambridge M.D.   On: 11/26/2023 13:59   CT Angio Chest PE W and/or Wo Contrast  Result Date:  11/13/2023 CLINICAL DATA:  Shortness of breath. Concern for pulmonary embolism. EXAM: CT ANGIOGRAPHY CHEST WITH CONTRAST TECHNIQUE: Multidetector CT imaging of the chest was performed using the standard protocol during bolus administration of intravenous contrast. Multiplanar CT image reconstructions and MIPs were obtained to evaluate the vascular anatomy. RADIATION DOSE REDUCTION: This exam was performed according to the departmental dose-optimization program which includes automated exposure control, adjustment of the mA and/or kV according to patient size and/or use of iterative reconstruction technique. CONTRAST:  75mL OMNIPAQUE IOHEXOL 350 MG/ML SOLN COMPARISON:  CT dated 03/05/2019. Chest radiograph dated 11/12/2023. FINDINGS: Cardiovascular: There is no cardiomegaly or pericardial effusion. There is coronary vascular calcification. Moderate atherosclerotic calcification of the thoracic aorta. No aneurysmal dilatation or dissection. The origins of the great vessels of the aortic arch appear patent. No pulmonary artery embolus identified. Mediastinum/Nodes: Right hilar adenopathy measures 2.5 cm in short axis. Subcarinal lymph node measures 2.4 cm. Upper mediastinal adenopathy measures 2 cm in short axis. Right supraclavicular adenopathy measures 2.5 cm short axis. Mildly enlarged and rounded left axillary lymph nodes. The esophagus is grossly unremarkable. No mediastinal fluid collection. Lungs/Pleura:  The lungs are clear. There is no pleural effusion or pneumothorax. The central airways are patent. Upper Abdomen: Bilateral adrenal thickening and nodularity concerning for metastatic disease. An 11 mm nodule in the right upper abdomen posterior to the superior pole of the right kidney consistent with metastatic disease. Musculoskeletal: Several metastatic implants in the subcutaneous soft tissues of the posterior thoracic wall measuring up to 3 cm in the left lower posterior chest wall. Multiple lytic  metastasis with involvement of T5, T6, and T10 vertebra. There is associated pathologic fracture of T10 with destruction and buckling of the posterior cortex and soft tissue mass extending into the central canal. There is focal high-grade narrowing of the central canal at T10. MRI may provide better evaluation. Additional lesion involving the right anterior chest wall and fifth rib as well as lytic lesion of the lateral left ninth rib. Review of the MIP images confirms the above findings. IMPRESSION: 1. No CT evidence of pulmonary artery embolus. 2. Mediastinal, right hilar, right supraclavicular, and left axillary adenopathy. 3. Upper abdominal metastasis involving the adrenal glands. 4. Multiple osseous and chest wall metastatic disease. Associated pathologic fracture of T10 with high-grade focal narrowing of the central canal. MRI may provide better evaluation. 5.  Aortic Atherosclerosis (ICD10-I70.0). Electronically Signed   By: Elgie Collard M.D.   On: 11/13/2023 00:56   DG Chest 2 View  Result Date: 11/12/2023 CLINICAL DATA:  Shortness of breath EXAM: CHEST - 2 VIEW COMPARISON:  10/11/2023 FINDINGS: The heart size and mediastinal contours are within normal limits. Aortic atherosclerosis. Minimal linear left basilar scarring or atelectasis. No focal airspace consolidation, pleural effusion, or pneumothorax. The visualized skeletal structures are unremarkable. IMPRESSION: No active cardiopulmonary disease. Electronically Signed   By: Duanne Guess D.O.   On: 11/12/2023 18:05   VAS [place] ABI WITH/WO TBI  Result Date: 10/31/2023  LOWER EXTREMITY DOPPLER STUDY Patient Name:  RANNON ECONOMOS  Date of Exam:   10/31/2023 Medical Rec #: [id number]            Accession #:    [id number] Date of Birth: 1951-12-24            Patient Gender: M Patient Age:   16 years Exam Location:  [facility] Procedure:      VAS [place] ABI WITH/WO TBI Referring Phys: Lemar Livings  --------------------------------------------------------------------------------   Vascular Interventions: 12/12/22: Right CFA patch angioplasty and CFA to PTA                         bypass with PTFE and distal s cm of reversed GSV.                         03/09/23: Right AKA. Performing Technologist: Thereasa Parkin RVT  Examination Guidelines: A complete evaluation includes at minimum, Doppler waveform signals and systolic blood pressure reading at the level of bilateral brachial, anterior tibial, and posterior tibial arteries, when vessel segments are accessible. Bilateral testing is considered an integral part of a complete examination. Photoelectric Plethysmograph (PPG) waveforms and toe systolic pressure readings are included as required and additional duplex testing as needed. Limited examinations for reoccurring indications may be performed as noted.  ABI Findings: +--------+------------------+-----+--------+--------+ Right   Rt Pressure (mmHg)IndexWaveformComment  +--------+------------------+-----+--------+--------+ [id number]                                     +--------+------------------+-----+--------+--------+ +---------+------------------+-----+--------+-------+  Left     Lt Pressure (mmHg)IndexWaveformComment +---------+------------------+-----+--------+-------+ Brachial 112                                    +---------+------------------+-----+--------+-------+ ATA      83                0.72                 +---------+------------------+-----+--------+-------+ PTA      84                0.73                 +---------+------------------+-----+--------+-------+ Great Toe57                0.50                 +---------+------------------+-----+--------+-------+ +-------+-----------+-----------+------------+------------+ ABI/TBIToday's ABIToday's TBIPrevious ABIPrevious TBI +-------+-----------+-----------+------------+------------+ Right  AKA                    not aquired 0            +-------+-----------+-----------+------------+------------+ Left   0.73       0.5        0.82        0.35         +-------+-----------+-----------+------------+------------+  Previous ABI pre AKA on 02/14/23.  Summary: Left: Resting left ankle-brachial index indicates moderate left lower extremity arterial disease. The left toe-brachial index is abnormal. *See table(s) above for measurements and observations.  Electronically signed by Lemar Livings MD on 10/31/2023 at 4:25:07 PM.    Final     Labs:  CBC: Recent Labs    11/12/23 1519 11/26/23 1033 11/27/23 0341 11/28/23 0324  WBC 9.7 14.4* 11.7* 10.3  HGB 8.9* 9.6* 7.1* 6.7*  HCT 28.1* 30.7* 22.4* 21.3*  PLT 378 313 199 210    COAGS: Recent Labs    12/06/22 1040 10/11/23 1025  INR 1.0 1.2  APTT 31  --     BMP: Recent Labs    11/12/23 1519 11/26/23 1033 11/27/23 0859 11/28/23 0324  NA 134* 139 140 138  K 3.6 4.0 3.7 3.6  CL 97* 101 105 106  CO2 25 21* 26 27  GLUCOSE 108* 165* 160* 132*  BUN 37* 97* 60* 43*  CALCIUM 9.9 12.4* 10.5* 9.9  CREATININE 1.60* 2.21* 1.38* 1.19  GFRNONAA 45* 31* 54* >60    LIVER FUNCTION TESTS: Recent Labs    11/12/23 1519 11/26/23 1033 11/27/23 0859 11/28/23 0323  BILITOT 0.4 1.1 0.8 0.6  AST 31 149* 119* 89*  ALT 30 69* 65* 58*  ALKPHOS 101 104 77 75  PROT 7.5 8.0 5.3* 5.0*  ALBUMIN 2.5* 2.6* 1.6* 1.5*    TUMOR MARKERS: No results for input(s): "AFPTM", "CEA", "CA199", "CHROMGRNA" in the last 8760 hours.  Assessment and Plan:  Scheduled for left abdominal wall mass biopsy Risks and benefits of left abdominal wall mass biopsy was discussed with the patient and/or patient's family per phone including, but not limited to bleeding, infection, damage to adjacent structures or low yield requiring additional tests.  All of the questions were answered and there is agreement to proceed.  Consent signed and in chart.  Thank you for this  interesting consult.  I greatly enjoyed meeting DONNEY KALLIO and look forward to participating in their care.  A copy of this report  was sent to the requesting provider on this date.  Electronically Signed: Robet Leu, PA-C 11/28/2023, 10:02 AM   I spent a total of 20 Minutes    in face to face in clinical consultation, greater than 50% of which was counseling/coordinating care for left abdominal wall mass bx

## 2023-11-28 NOTE — Procedures (Addendum)
Interventional Radiology Procedure Note  Procedure: Bedside US guided biopsy of left ant abd wall mass.  Complications: None EBL: None Recommendations: - Cont current care  - Routine wound care - Follow up pathology - Advance diet OK with VIR - AC ok when needed  Signed,  Yvone Neu. Loreta Ave, DO, ABVM, RPVI

## 2023-11-28 NOTE — Progress Notes (Signed)
Pt received to 2 west room 24 at this time from ICU.  Pt oriented to room and call bell.  See assessment and vs's. Pt refused to turn at this time due to back pain.  Pt recently had pain med and agrees to turn after med kicks in for assessment of skin and posterior lung sounds.

## 2023-11-28 NOTE — Progress Notes (Signed)
PHARMACY - ANTICOAGULATION CONSULT NOTE  Pharmacy Consult for heparin  Indication: DVT  Allergies  Allergen Reactions   Shellfish Allergy Swelling    Patient Measurements: Height: 5\' 10"  (177.8 cm) Weight: 92.4 kg (203 lb 11.3 oz) IBW/kg (Calculated) : 73 Heparin Dosing Weight: 91.3kg   Vital Signs: Temp: 98.7 F (37.1 C) (12/04 0729) Temp Source: Oral (12/04 0729) BP: 110/57 (12/04 0615) Pulse Rate: 88 (12/04 0615)  Labs: Recent Labs    11/26/23 1033 11/27/23 0341 11/27/23 0859 11/27/23 2233 11/28/23 0324  HGB 9.6* 7.1*  --   --  6.7*  HCT 30.7* 22.4*  --   --  21.3*  PLT 313 199  --   --  210  HEPARINUNFRC  --   --   --  0.61 0.54  CREATININE 2.21*  --  1.38*  --  1.19  CKTOTAL 6,948* 3,076*  --   --   --     Estimated Creatinine Clearance: 63.2 mL/min (by C-G formula based on SCr of 1.19 mg/dL).   Medical History: Past Medical History:  Diagnosis Date   Family history of metabolic acidosis with increased anion gap 03/05/2023   GERD (gastroesophageal reflux disease)    History of kidney stones    Hx of AKA (above knee amputation), right (HCC)    Hyperlipidemia    Hypertension    Metastatic disease (HCC)    PAD (peripheral artery disease) (HCC)    S/p R CFA and PTA bypass 11/2022, critical limb ischemia s/p R AKA 02/2023   Prostate cancer [facility]) 2011   radiation june 2020   Septic shock (HCC) 11/26/2023   Umbilical hernia    Wears dentures    full   Wears glasses    Wears partial dentures    lower    Assessment: Patient admitted following a fall and was found down after 48 hours. Vascular US showing age indeterminate DVT in left femoral vein, left popliteal vein, left posterior tibial veins, and left peroneal veins. Pharmacy consulted to dose heparin.   HgB 6.7 (1 unit PRBC transfused) and PLTS 210.  Heparin level therapeutic at 0.54 (second therapeutic on current dose)  No issues with bleeding or line per RN.   Goal of Therapy:  Heparin level  0.3-0.7 units/ml Monitor platelets by anticoagulation protocol: Yes   Plan:  Continue heparin at 1500 units/hr  Daily heparin level and CBC Pharmacy will continue to follow   Cedric Fishman, PharmD, BCPS, BCCCP Clinical Pharmacist

## 2023-11-29 DIAGNOSIS — I82402 Acute embolism and thrombosis of unspecified deep veins of left lower extremity: Secondary | ICD-10-CM

## 2023-11-29 DIAGNOSIS — R571 Hypovolemic shock: Secondary | ICD-10-CM | POA: Diagnosis not present

## 2023-11-29 DIAGNOSIS — N179 Acute kidney failure, unspecified: Secondary | ICD-10-CM | POA: Diagnosis not present

## 2023-11-29 DIAGNOSIS — D649 Anemia, unspecified: Secondary | ICD-10-CM

## 2023-11-29 DIAGNOSIS — E440 Moderate protein-calorie malnutrition: Secondary | ICD-10-CM

## 2023-11-29 DIAGNOSIS — T796XXA Traumatic ischemia of muscle, initial encounter: Secondary | ICD-10-CM

## 2023-11-29 LAB — BASIC METABOLIC PANEL
Anion gap: 7 (ref 5–15)
BUN: 29 mg/dL — ABNORMAL HIGH (ref 8–23)
CO2: 25 mmol/L (ref 22–32)
Calcium: 10 mg/dL (ref 8.9–10.3)
Chloride: 102 mmol/L (ref 98–111)
Creatinine, Ser: 0.97 mg/dL (ref 0.61–1.24)
GFR, Estimated: >60 mL/min (ref >60)
Glucose, Bld: 98 mg/dL (ref 70–99)
Potassium: 3.8 mmol/L (ref 3.5–5.1)
Sodium: 134 mmol/L — ABNORMAL LOW (ref 135–145)

## 2023-11-29 LAB — BPAM RBC
Blood Product Expiration Date: 202412202359
ISSUE DATE / TIME: 202412041031
Unit Type and Rh: 5100

## 2023-11-29 LAB — CBC
HCT: 26.1 % — ABNORMAL LOW (ref 39.0–52.0)
Hemoglobin: 8.4 g/dL — ABNORMAL LOW (ref 13.0–17.0)
MCH: 27.8 pg (ref 26.0–34.0)
MCHC: 32.2 g/dL (ref 30.0–36.0)
MCV: 86.4 fL (ref 80.0–100.0)
Platelets: 257 10*3/uL (ref 150–400)
RBC: 3.02 MIL/uL — ABNORMAL LOW (ref 4.22–5.81)
RDW: 17.5 % — ABNORMAL HIGH (ref 11.5–15.5)
WBC: 10.7 10*3/uL — ABNORMAL HIGH (ref 4.0–10.5)
nRBC: 0.2 % (ref 0.0–0.2)

## 2023-11-29 LAB — TYPE AND SCREEN
ABO/RH(D): O POS
Antibody Screen: NEGATIVE
Unit division: 0

## 2023-11-29 LAB — CK: Total CK: 529 U/L — ABNORMAL HIGH (ref 49–397)

## 2023-11-29 LAB — HEPARIN LEVEL (UNFRACTIONATED): Heparin Unfractionated: 0.35 [IU]/mL (ref 0.30–0.70)

## 2023-11-29 MED ORDER — ENSURE ENLIVE PO LIQD
237.0000 mL | Freq: Two times a day (BID) | ORAL | Status: DC
Start: 2023-11-29 — End: 2023-12-05
  Administered 2023-11-29 – 2023-12-05 (×10): 237 mL via ORAL

## 2023-11-29 MED ORDER — ADULT MULTIVITAMIN W/MINERALS CH
1.0000 | ORAL_TABLET | Freq: Every day | ORAL | Status: DC
  Administered 2023-11-29 – 2023-12-11 (×13): 1 via ORAL
  Filled 2023-11-29 (×13): qty 1

## 2023-11-29 MED ORDER — GERHARDT'S BUTT CREAM
TOPICAL_CREAM | Freq: Two times a day (BID) | CUTANEOUS | Status: DC
  Administered 2023-12-08 – 2023-12-10 (×2): 1 via TOPICAL
  Filled 2023-11-29: qty 60

## 2023-11-29 NOTE — Evaluation (Signed)
Physical Therapy Evaluation Patient Details Name: [name] MRN: [id number] DOB: 1951/11/21 Today's Date: 11/29/2023  History of Present Illness  72 y.o. male admitted 11/26/23 found down with 2-3 day h/o weakness and fall. Workup for rhabdomyolysis, AKI, septic shock. Imaging with L scalp hematoma. CT findings consistent with metastatic disease (multiple subQ masses, bilateral adrenal masses, R RP masses and multiple osseous metastases) with largest osseous metastasis of T10 vertebral body with cord compression S/p biopsy L abdominal wall mass 12/4. PMH includes prostate CA, PAD, HTN, HLD, R AKA (03/09/23).   Clinical Impression  Pt presents with an overall decrease in functional mobility secondary to above. PTA, pt states mod indep with w/c, lives alone with sister nearby who assists with ADL/iADLs as needed. Today, pt requiring maxA for all bed-level mobility and repositioning; pt declined sitting EOB or OOB transfer secondary to pain and fatigue. Pt would benefit from SNF-level therapies to maximize functional mobility and independence prior to return home. Will follow acutely to address established goals.    If plan is discharge home, recommend the following: Two people to help with walking and/or transfers;Two people to help with bathing/dressing/bathroom   Can travel by private vehicle   No    Equipment Recommendations  (TBD)  Recommendations for Other Services       Functional Status Assessment Patient has had a recent decline in their functional status and demonstrates the ability to make significant improvements in function in a reasonable and predictable amount of time.     Precautions / Restrictions Precautions Precautions: Fall;Other (comment) Precaution Comments: h/o R AKA 02/2023 (has prosthetic LE at home); spinal precautions for comfort with mets to spine Restrictions Weight Bearing Restrictions: No      Mobility  Bed Mobility Overal bed mobility: Needs  Assistance Bed Mobility: Rolling Rolling: Max assist, Used rails         General bed mobility comments: maxA for LLE management and trunk rotation rolling R/L, cues for sequencing and bed rail use. pt able to assist scooting self up in bed in trendelenberg when assisted to reach BUEs overhead to rails; limited by pain. pt left partially turned onto R side with pillow for sacral pressure relief    Transfers                   General transfer comment: pt declined secondary to pain and fatigue    Ambulation/Gait                  Stairs            Wheelchair Mobility     Tilt Bed    Modified Rankin (Stroke Patients Only)       Balance                                             Pertinent Vitals/Pain Pain Assessment Pain Assessment: Faces Faces Pain Scale: Hurts whole lot Pain Location: multiple spots, including back, LLE, RUE Pain Descriptors / Indicators: Aching, Discomfort, Grimacing Pain Intervention(s): Limited activity within patient's tolerance, Monitored during session, Repositioned    Home Living Family/patient expects to be discharged to:: Private residence Living Arrangements: Alone Available Help at Discharge: Family;Friend(s);Available PRN/intermittently Type of Home: House Home Access: Ramped entrance       Home Layout: One level Home Equipment: BSC/3in1;Rolling Walker (2 wheels);Cane - single point;Wheelchair - manual  Additional Comments: lives alone, has a sister who can be there 24/7, also states he has a friend who is around sometimes    Prior Function Prior Level of Function : Needs assist;Independent/Modified Independent             Mobility Comments: reports mod indep with wheelchair, tranfers via stand/squat pivots without additional DME; uses RLE prosthetic when out, but typically does not around house. does not drive ADLs Comments: pt states mostly mod indep with ADLs, sometimes sister there for  bathing if needed. pt states sister brings him food; he manages his own meds     Extremity/Trunk Assessment   Upper Extremity Assessment Upper Extremity Assessment: RUE deficits/detail RUE Deficits / Details: swelling noted to forearm/hand, noticable marking on 1st digit fingernail spanning from cuticle to distal end through middle of nail; able to reach BUEs overhead with initial stiffness noted RUE Sensation: WNL RUE Coordination: decreased fine motor;decreased gross motor    Lower Extremity Assessment Lower Extremity Assessment: RLE deficits/detail;LLE deficits/detail RLE Deficits / Details: h/o R AKA; gross hip strength <3/5 observed LLE Deficits / Details: functional observed strength hip <3/5, knee flex/ext 3/5, ankle 3/5; apparent LLE edema       Communication   Communication Communication: No apparent difficulties  Cognition Arousal: Alert Behavior During Therapy: WFL for tasks assessed/performed Overall Cognitive Status: No family/caregiver present to determine baseline cognitive functioning Area of Impairment: Attention, Safety/judgement, Awareness, Problem solving                   Current Attention Level: Selective   Following Commands: Follows one step commands consistently Safety/Judgement: Decreased awareness of deficits Awareness: Emergent Problem Solving: Requires verbal cues General Comments: grossly A/Ox4, seems to be not aware of current deficits. requires intermittent redirection to current task and conversation; slow to get words/thoughts out at times        General Comments General comments (skin integrity, edema, etc.): initiated educ re: POC, activity recommendations (including BUE/BLE AROM while in bed), RUE edema control, importance of OOB mobility, discharge needs    Exercises Other Exercises Other Exercises: BUE shoulder flex/abd, R digit flex/ext, LLE AAROM heel slide   Assessment/Plan    PT Assessment Patient needs continued PT  services  PT Problem List Decreased strength;Decreased range of motion;Decreased activity tolerance;Decreased balance;Decreased mobility;Decreased cognition;Decreased knowledge of use of DME;Decreased safety awareness;Pain       PT Treatment Interventions DME instruction;Functional mobility training;Therapeutic activities;Therapeutic exercise;Balance training;Patient/family education;Wheelchair mobility training    PT Goals (Current goals can be found in the Care Plan section)  Acute Rehab PT Goals Patient Stated Goal: willing to consider post-acute rehab at SNF PT Goal Formulation: With patient Time For Goal Achievement: 12/13/23 Potential to Achieve Goals: Good    Frequency Min 1X/week     Co-evaluation               AM-PAC PT "6 Clicks" Mobility  Outcome Measure Help needed turning from your back to your side while in a flat bed without using bedrails?: Total Help needed moving from lying on your back to sitting on the side of a flat bed without using bedrails?: Total Help needed moving to and from a bed to a chair (including a wheelchair)?: Total Help needed standing up from a chair using your arms (e.g., wheelchair or bedside chair)?: Total Help needed to walk in hospital room?: Total Help needed climbing 3-5 steps with a railing? : Total 6 Click Score: 6    End of  Session   Activity Tolerance: Patient limited by fatigue;Patient limited by pain Patient left: in bed;with call bell/phone within reach;with bed alarm set Nurse Communication: Mobility status;Need for lift equipment PT Visit Diagnosis: Other abnormalities of gait and mobility (R26.89);Muscle weakness (generalized) (M62.81);Pain    Time: 1016-1040 PT Time Calculation (min) (ACUTE ONLY): 24 min   Charges:   PT Evaluation $PT Eval Moderate Complexity: 1 Mod PT Treatments $Therapeutic Activity: 8-22 mins PT General Charges $$ ACUTE PT VISIT: 1 Visit       Ina Homes, PT, DPT Acute  Rehabilitation Services  Personal: Secure Chat Rehab Office: [phone]  Malachy Chamber 11/29/2023, 2:43 PM

## 2023-11-29 NOTE — Care Management Important Message (Signed)
Important Message  Patient Details  Name: [name] MRN: [id number] Date of Birth: 1951-01-19   Important Message Given:      Dorena Bodo 11/29/2023, 3:28 PM

## 2023-11-29 NOTE — Progress Notes (Signed)
Initial Nutrition Assessment  DOCUMENTATION CODES:   Non-severe (moderate) malnutrition in context of acute illness/injury, Obesity unspecified  INTERVENTION:  Liberalize diet Ensure Plus High Protein po BID, each supplement provides 350 kcal and 20 grams of protein. Multivitamin with minerals   NUTRITION DIAGNOSIS:   Moderate Malnutrition related to acute illness as evidenced by estimated needs, moderate fat depletion, moderate muscle depletion.    GOAL:   Patient will meet greater than or equal to 90% of their needs    MONITOR:   PO intake, Supplement acceptance  REASON FOR ASSESSMENT:   Consult Assessment of nutrition requirement/status, Calorie Count  ASSESSMENT:   72 y.o. M presented [facility] 12/2 as transfer from [facility] for septic shock and rhabdomyolysis. Presented to [facility] 11/19 for SOB, anemia. PMH;  HTN, GERd, HLD, Prostate cancer, Umbilical hernia, PAD, Metastatic disease, Right AKA, septic shock. Patient eating breakfast enjoying a blueberry muffin,  at time of visit. At first not very verbal although the longer the visit the more verbal and emotional pt had gotten.  He stated that he lives at home with no help his sister comes by and visits him. He said that he fell and was unable to get up.  He is unsure as to how long he had been down for. Pt reports that his appetite had been decreased although has improved the last couple of days. He had breakfast in front of him and he had eaten ~ 50% of meal prior to my visit. With patient reporting improved appetite and implementation of supplements  suspect calorie count not necessary at this time.  There is no benefit to excessive dietary restrictions related advanced age, increased nutrient needs and moderate malnutrition . Patient would benefit better from liberalized diet to help meet increased nutrient needs and promote better oral intake.  12/2- Ct cervical spine/head/A/P and maxillofacial revealed Large lytic lesion T1  Multiple enlarged cervical lymph nodes in neck, bil adrenal masses.  12/04- Bedside US guided biopsy of left ant abd wall mass.  Admit weight: 89.3 kg Current weight: 95.9 kg  Weight history: 11/29/23 95.9 kg  11/12/23 96.6 kg  10/31/23 98.9 kg  10/23/23 99.2 kg  10/19/23 99.8 kg  10/11/23 99.8 kg  10/10/23 99.8 kg  07/27/23 101.6 kg  07/10/23 101.6 kg  06/21/23 99.8 kg   Hospital weight history:  11/29/23 0500 95.9 kg 211.42 lbs  11/28/23 0501 92.4 kg 203.71 lbs  11/27/23 1135 91.4 kg 201.5 lbs  11/27/23 0500 91.4 kg 201.5 lbs  11/26/23 1922 91.4 kg 201.5 lbs  11/26/23 1849 89.3 kg 196.87 lbs    Average Meal Intake: No current documentation  Reviewed for Nutritionally Relevant Medications   Labs Reviewed    NUTRITION - FOCUSED PHYSICAL EXAM:  Flowsheet Row Most Recent Value  Orbital Region Moderate depletion  Upper Arm Region Mild depletion  Thoracic and Lumbar Region No depletion  Buccal Region Moderate depletion  Temple Region Moderate depletion  Clavicle Bone Region Mild depletion  Clavicle and Acromion Bone Region No depletion  Dorsal Hand Unable to assess  Patellar Region No depletion  Anterior Thigh Region No depletion  Posterior Calf Region No depletion  Edema (RD Assessment) Moderate  Hair Reviewed  Eyes Reviewed  Mouth Reviewed  Skin Reviewed  Nails Reviewed       Diet Order:   Diet Order             Diet Heart Room service appropriate? Yes; Fluid consistency: Thin  Diet effective now  EDUCATION NEEDS:   Education needs have been addressed  Skin:  Skin Assessment: Skin Integrity Issues: Skin Integrity Issues:: Other (Comment), Stage II, Incisions Stage II: Pretibial distal left medial Incisions: right thigh Other: MASD, scrotum bilateral, Incontinence associated dermtitis perineum bilateral ulcerations  Last BM:  12/04  Height:   Ht Readings from Last 1 Encounters:  11/27/23 5\' 10"  (1.778 m)    Weight:    Wt Readings from Last 1 Encounters:  11/29/23 95.9 kg    Ideal Body Weight:     BMI:  Body mass index is 30.34 kg/m.  Estimated Nutritional Needs:   Kcal:  2300-2650 kcal/d  Protein:  100-115 g/d  Fluid:  1ml/kcal    Jamelle Haring RDN, LDN Clinical Dietitian  RDN pager # available on Amion

## 2023-11-29 NOTE — Progress Notes (Signed)
PROGRESS NOTE    [name]  ONG:[id number] DOB: [birth year] DOA: 11/26/2023 PCP: Rica Records, FNP   Brief Narrative: [name] is a 72 y.o. male with a history of hypertension, hyperlipidemia, PAD status post right AKA, CKD stage IIIa, GERD, prostate cancer status post prostatectomy.  Patient presented secondary to being found down on the ground and altered and was found to have evidence of hypotension concerning for septic shock with additional evidence of rhabdomyolysis.  Patient started empirically on antibiotics, started on vasopressor support, and IV fluids and was transferred to [facility] from [facility] for ICU admission.  Antibiotics were discontinued secondary to unlikely infectious etiology.  Hypotension and rhabdomyolysis improved with IV fluids and vasopressor support was discontinued.  Admission complicated by the identification of metastatic disease of unknown primary   Assessment and Plan:  Hypovolemic shock Sepsis ruled out. Patient managed in ICU with norepinephrine and fluid resuscitation.   Traumatic rhabdomyolysis Secondary to being down. CK of 6,948 on admission. Associated elevated AST/ALT. Improvement with IV fluids.  Acute anemia Anemia of chronic disease Baseline hemoglobin of 8-9 d/dL. During this admission, hemoglobin drifted down to a low of 6.7 g/dL requiring a transfusion of 1 unit of PRBC on 12/4. Post-transfusion hemoglobin of 8.5 g/dL. Hemoglobin appears stable.  AKI Baseline creatinine appears to be around 1.3; unclear if patient has CKD stage IIIa vs CKD stage II. Creatinine of 2.21 on admission with improvement to 0.97 prior to discharge.  GERD Continue Protonix.  Metastatic disease Unknown primary. Metastatic disease noted of right  adrenal gland in addition to bony metastasis noted at T5, T6 and T10 with associated pathologic fractures, in addition to the anterior right fifth rib. Medical oncology  consulted. Enlarged right cervical lymph node noted in addition to hilar mass/adenopathy with mediastinal, axillary and right supraclavicular adenopathy on CT. Bedside US guided biopsy of left anterior abdominal wall mass performed on 12/4. -Follow-up biopsy results -Oncology recommendations: awaiting biopsy results  Central canal narrowing Noted on CT imaging at T10 secondary to extension of metastatic disease. Severe spinal stenosis at T1 and mild stenosis at T10 noted on MRI . No cord compression noted on MRI.  Primary hypertension Antihypertensives held secondary to hypovolemic shock.  Right arm swelling -Check venous duplex to rule out DVT  Hyperlipidemia Lipitor held.  PAD History of right AKA. -Continue aspirin.  Age indeterminate left lower extremity DVT Chronic right lower extremity DVT Indeterminate DVT involving the left femoral vein, left popliteal vein, left posterior tibial veins and left peroneal veins. Chronic DVT involving the right femoral vein. In setting of presumed metastatic cancer. Patient started on heparin IV. -Continue heparin IV  Scrotal wounds Noted. Wound care consulted.  Phantom limb pain Noted.  Moderate malnutrition Dietitian recommendations (12/5): Ensure Plus High Protein po BID, each supplement provides 350 kcal and 20 grams of protein. Multivitamin with minerals   Pressure injury Distal left/medial pretibial area. Present on admission.   DVT prophylaxis: Heparin IV Code Status:   Code Status: Full Code Family Communication: None at bedside Disposition Plan: Discharge to SNF pending ongoing workup for metastatic cancer/oncology recommendations   Consultants:  PCCM Medical oncology Interventional radiology  Procedures:  12/4: Bedside US guided biopsy of left ant abd wall mass   Antimicrobials: Zosyn Vancomycin Cefepime    Subjective: Patient reports no specific concerns today. No issues overnight.  Objective: BP 122/70  (BP Location: Left Arm)   Pulse 94   Temp 98.7  F (37.1 C) (Oral)   Resp 17   Ht 5\' 10"  (1.778 m)   Wt 95.9 kg   SpO2 93%   BMI 30.34 kg/m   Examination:  General exam: Appears calm and comfortable Respiratory system: Clear to auscultation. Respiratory effort normal. Cardiovascular system: S1 & S2 heard, RRR. No murmurs. Gastrointestinal system: Abdomen is nondistended, soft and nontender. Normal bowel sounds heard. Central nervous system: Alert and oriented. No focal neurological deficits. Musculoskeletal: LLE 2+ pitting edema. Right AKA. Psychiatry: Judgement and insight appear normal. Mood & affect appropriate.    Data Reviewed: I have personally reviewed following labs and imaging studies  CBC Lab Results  Component Value Date   WBC 10.7 (H) 11/29/2023   RBC 3.02 (L) 11/29/2023   HGB 8.4 (L) 11/29/2023   HCT 26.1 (L) 11/29/2023   MCV 86.4 11/29/2023   MCH 27.8 11/29/2023   PLT 257 11/29/2023   MCHC 32.2 11/29/2023   RDW 17.5 (H) 11/29/2023   LYMPHSABS 0.9 11/26/2023   MONOABS 0.5 11/26/2023   EOSABS 0.0 11/26/2023   BASOSABS 0.0 11/26/2023     Last metabolic panel Lab Results  Component Value Date   NA 134 (L) 11/29/2023   K 3.8 11/29/2023   CL 102 11/29/2023   CO2 25 11/29/2023   BUN 29 (H) 11/29/2023   CREATININE 0.97 11/29/2023   GLUCOSE 98 11/29/2023   GFRNONAA >60 11/29/2023   GFRAA (L) 03/18/2010    60        The eGFR has been calculated using the MDRD equation. This calculation has not been validated in all clinical situations. eGFR's persistently <60 mL/min signify possible Chronic Kidney Disease.   CALCIUM 10.0 11/29/2023   PHOS 3.2 11/27/2023   PROT 5.0 (L) 11/28/2023   ALBUMIN 1.5 (L) 11/28/2023   LABGLOB 4.2 (H) 10/23/2023   AGRATIO 0.6 (L) 10/23/2023   BILITOT 0.6 11/28/2023   ALKPHOS 75 11/28/2023   AST 89 (H) 11/28/2023   ALT 58 (H) 11/28/2023   ANIONGAP 7 11/29/2023    GFR: Estimated Creatinine Clearance: 78.9 mL/min  (by C-G formula based on SCr of 0.97 mg/dL).  Recent Results (from the past 240 hour(s))  Blood culture (routine x 2)     Status: None (Preliminary result)   Collection Time: 11/26/23 12:13 PM   Specimen: BLOOD  Result Value Ref Range Status   Specimen Description BLOOD BLOOD RIGHT ARM  Final   Special Requests   Final    BOTTLES DRAWN AEROBIC AND ANAEROBIC Blood Culture results may not be optimal due to an inadequate volume of blood received in culture bottles   Culture   Final    NO GROWTH 3 DAYS Performed at [facility], [address] [place], [place] [address]    Report Status PENDING  Incomplete  Blood culture (routine x 2)     Status: None (Preliminary result)   Collection Time: 11/26/23 12:20 PM   Specimen: BLOOD  Result Value Ref Range Status   Specimen Description BLOOD BLOOD RIGHT HAND AEROBIC BOTTLE ONLY  Final   Special Requests   Final    Blood Culture results may not be optimal due to an inadequate volume of blood received in culture bottles BOTTLES DRAWN AEROBIC ONLY   Culture   Final    NO GROWTH 3 DAYS Performed at [facility], [address], [place], [place] [address]    Report Status PENDING  Incomplete  MRSA Next Gen by PCR, Nasal     Status:  None   Collection Time: 11/26/23  9:02 PM   Specimen: Nasal Mucosa; Nasal Swab  Result Value Ref Range Status   MRSA by PCR Next Gen NOT DETECTED NOT DETECTED Final    Comment: (NOTE) The GeneXpert MRSA Assay (FDA approved for NASAL specimens only), is one component of a comprehensive MRSA colonization surveillance program. It is not intended to diagnose MRSA infection nor to guide or monitor treatment for MRSA infections. Test performance is not FDA approved in patients less than 24 years old. Performed at [facility], [address], [place], [place] [address]       Radiology Studies: IR US Guide Bx Asp/Drain  Result Date: 11/28/2023 INDICATION: 72 year old male referred for biopsy. He has suspicion  of lung carcinoma EXAM: ULTRASOUND-GUIDED BIOPSY OF LEFT LOWER QUADRANT ABDOMINAL WALL MASS. MEDICATIONS: None. ANESTHESIA/SEDATION: None COMPLICATIONS: None PROCEDURE: Informed written consent was obtained from the patient after a thorough discussion of the procedural risks, benefits and alternatives. All questions were addressed. Maximal Sterile Barrier Technique was utilized including caps, mask, sterile gowns, sterile gloves, sterile drape, hand hygiene and skin antiseptic. A timeout was performed prior to the initiation of the procedure. The procedure, risks, benefits, and alternatives were explained to the patient. Questions regarding the procedure were encouraged and answered. The patient understands and consents to the procedure. Ultrasound survey was performed with images stored and sent to PACs. The left anterior abdominal wall was prepped with chlorhexidine in a sterile fashion, and a sterile drape was applied covering the operative field. A sterile gown and sterile gloves were used for the procedure. Local anesthesia was provided with 1% Lidocaine. Ultrasound guidance was used to infiltrate the region with 1% lidocaine for local anesthesia. Multiple 18 gauge core biopsy were then acquired of the soft tissue nodule using ultrasound guidance. Images were stored. Final image was stored after biopsy. Patient tolerated the procedure well and remained hemodynamically stable throughout. No complications were encountered and no significant blood loss was encounter IMPRESSION: Status post ultrasound-guided anterior abdominal wall mass biopsy Signed, Yvone Neu. Miachel Roux, RPVI [facility] Electronically Signed   By: Gilmer Mor D.O.   On: 11/28/2023 13:54   MR THORACIC SPINE W WO CONTRAST  Result Date: 11/28/2023 CLINICAL DATA:  Metastatic disease evaluation EXAM: MRI THORACIC WITHOUT AND WITH CONTRAST TECHNIQUE: Multiplanar and multiecho pulse  sequences of the thoracic spine were obtained without and with intravenous contrast. CONTRAST:  9mL GADAVIST GADOBUTROL 1 MMOL/ML IV SOLN COMPARISON:  None Available. FINDINGS: Alignment:  Physiologic. Vertebrae: Soft tissue mass replacing the T1 spinous process. Metastatic lesions of the vertebral bodies at T5, T6, T10 and L1. The T10 lesion extends into the right articular pillar. There is a pathologic fracture at the posterior aspect of the T10 vertebral body with approximately 25% height loss. Cord:  Normal signal and morphology. Paraspinal and other soft tissues: There are nodular soft tissue lesions superior to both scapulae. There is an additional soft tissue nodule within the posterior subcutaneous fat at the L1 level. Disc levels: Axial images are markedly motion degraded. There is severe spinal canal stenosis at the T1 level secondary to dorsal mass effect from the soft tissue mass replacing the spinous process. There is mild spinal canal stenosis at the T10 level due to posterior bulging of the vertebral body lesion. IMPRESSION: 1. Soft tissue mass replacing the T1 spinous process causing severe spinal canal stenosis. 2. Metastatic lesions of the vertebral bodies at T5,  T6, T10 and L1. 3. Pathologic fracture at the posterior aspect of the T10 vertebral body with approximately 25% height loss. 4. Nodular soft tissue lesions superior to both scapulae and within the posterior subcutaneous fat at the L1 level, consistent with metastatic disease. Electronically Signed   By: Deatra Robinson M.D.   On: 11/28/2023 02:56   CT CHEST WO CONTRAST  Result Date: 11/27/2023 CLINICAL DATA:  Concern for lung cancer and metastatic disease. Interstitial lung disease. EXAM: CT CHEST WITHOUT CONTRAST TECHNIQUE: Multidetector CT imaging of the chest was performed following the standard protocol without IV contrast. RADIATION DOSE REDUCTION: This exam was performed according to the departmental dose-optimization program which  includes automated exposure control, adjustment of the mA and/or kV according to patient size and/or use of iterative reconstruction technique. COMPARISON:  Chest CT dated 11/12/2023. FINDINGS: Evaluation of this exam is limited in the absence of intravenous contrast. Cardiovascular: Top-normal cardiac size. No pericardial effusion. Three-vessel coronary vascular calcification. Moderate atherosclerotic calcification of the thoracic aorta. No aneurysmal dilatation. The central pulmonary arteries are grossly unremarkable. Mediastinum/Nodes: Subcarinal adenopathy measuring 25 mm short axis. Right hilar mass/adenopathy measures 29 mm in short axis. Mediastinal adenopathy anterior to the carina measures 15 mm short axis. Additional upper mediastinal and right supraclavicular adenopathy as seen on the prior CT. No mediastinal fluid collection. Lungs/Pleura: Debris is noted in the bronchus intermedius extending into the right lower lobe bronchus. There is consolidative changes of the majority of the right lower lobe consistent with post obstructive atelectasis or infiltrate. There is partial consolidation of the left lower lobe. No pleural effusion or pneumothorax. Upper Abdomen: Right adrenal thickening consistent with metastasis. Musculoskeletal: Scattered osseous lytic metastasis involving the T5, T6, and T10 with associated pathologic fracture as seen previously. Extension of the metastasis at T10 to the central spinal with focal narrowing of the central canal. Additional lytic metastasis involving bilateral ribs. The largest lesion measures approximately 4.8 x 2.5 cm involving the anterior right fifth rib with associated destruction of the rib. Bilateral axillary adenopathy measure up to 17 mm in short axis on the left. IMPRESSION: 1. Right hilar mass/adenopathy as well as mediastinal, axillary, and right supraclavicular adenopathy. 2. Debris in the bronchus intermedius and right lower lobe bronchus with post  obstructive atelectasis or infiltrate of the majority of the right lower lobe. 3. Partial consolidation of the left lower lobe. 4. Right adrenal metastasis. 5. Osseous lytic metastasis with associated pathologic fractures as above. 6.  Aortic Atherosclerosis (ICD10-I70.0). Electronically Signed   By: Elgie Collard M.D.   On: 11/27/2023 19:26   VAS [place] LOWER EXTREMITY VENOUS (DVT)  Result Date: 11/27/2023  Lower Venous DVT Study Patient Name:  TARIG BARROZO  Date of Exam:   11/27/2023 Medical Rec #: [id number]            Accession #:    [id number] Date of Birth: 1951-02-20            Patient Gender: M Patient Age:   73 years Exam Location:  [facility] Procedure:      VAS [place] LOWER EXTREMITY VENOUS (DVT) Referring Phys: JESSICA MARSHALL --------------------------------------------------------------------------------  Indications: Swelling, and Recent fall. History of right AKA on 03/09/23.  Risk Factors: Surgery History of right common femoral to posterior tibial artery bypass on 12/12/22. Comparison Study: No priors. Performing Technologist: Marilynne Halsted RDMS, RVT  Examination Guidelines: A complete evaluation includes B-mode imaging, spectral Doppler, color Doppler, and power Doppler as needed of all accessible portions  of each vessel. Bilateral testing is considered an integral part of a complete examination. Limited examinations for reoccurring indications may be performed as noted. The reflux portion of the exam is performed with the patient in reverse Trendelenburg.  +---------+---------------+---------+-----------+----------+-------------------+ RIGHT    CompressibilityPhasicitySpontaneityPropertiesThrombus Aging      +---------+---------------+---------+-----------+----------+-------------------+ CFV      Partial        Yes      Yes                  Chronic             +---------+---------------+---------+-----------+----------+-------------------+ FV Prox  Partial                                       Chronic             +---------+---------------+---------+-----------+----------+-------------------+ FV Mid   Partial                                      Chronic             +---------+---------------+---------+-----------+----------+-------------------+ FV Distal                                             Not well visualized +---------+---------------+---------+-----------+----------+-------------------+ PFV      Partial                                                          +---------+---------------+---------+-----------+----------+-------------------+ POP                                                   AKA                 +---------+---------------+---------+-----------+----------+-------------------+ Occluded bypass graft.  +---------+---------------+---------+-----------+----------+-----------------+ LEFT     CompressibilityPhasicitySpontaneityPropertiesThrombus Aging    +---------+---------------+---------+-----------+----------+-----------------+ CFV      Full           Yes      Yes                                    +---------+---------------+---------+-----------+----------+-----------------+ SFJ      Full                                                           +---------+---------------+---------+-----------+----------+-----------------+ FV Prox  Full                                                           +---------+---------------+---------+-----------+----------+-----------------+  FV Mid   Full                                                           +---------+---------------+---------+-----------+----------+-----------------+ FV DistalPartial        Yes      Yes                  Age Indeterminate +---------+---------------+---------+-----------+----------+-----------------+ PFV      Full                                                            +---------+---------------+---------+-----------+----------+-----------------+ POP      Partial        No       No                   Age Indeterminate +---------+---------------+---------+-----------+----------+-----------------+ PTV      None                                         Age Indeterminate +---------+---------------+---------+-----------+----------+-----------------+ PERO     None                                         Age Indeterminate +---------+---------------+---------+-----------+----------+-----------------+     Summary: RIGHT: - Findings consistent with chronic deep vein thrombosis involving the right femoral vein.  LEFT: - Findings consistent with age indeterminate deep vein thrombosis involving the left femoral vein, left popliteal vein, left posterior tibial veins, and left peroneal veins.   *See table(s) above for measurements and observations. Electronically signed by Lemar Livings MD on 11/27/2023 at 5:15:26 PM.    Final       LOS: 3 days    Jacquelin Hawking, MD [facility] 11/29/2023, 9:05 AM   If 7PM-7AM, please contact night-coverage [email or web address]

## 2023-11-29 NOTE — TOC Initial Note (Addendum)
Transition of Care [facility]) - Initial/Assessment Note    Patient Details  Name: [name] MRN: [id number] Date of Birth: June 15, 1951  [facility] CM/SW Contact:    Jaria Conway A [place], Theresia Majors Phone Number: 11/29/2023, 2:01 PM  Clinical Narrative:                  CSW met with pt and pt's sister Dewayne Hatch at bedside. CSW said that there was possible recommendation for discharge to SNF based off therapy note. Pt stated that he would be ok with referral for SNF but has hopes of going home if possible. Pt's sister stated that she works part time for a home health agency, [facility] [employer] [facility], [phone] and would be able to work with pt in event going home at discharge was possible. RNCM notified. Disposition pending, SNF workup to be completed once confirmed.    TOC will continue to follow.   Expected Discharge Plan: Skilled Nursing Facility Barriers to Discharge: Continued Medical Work up, English as a second language teacher   Patient Goals and CMS Choice Patient states their goals for this hospitalization and ongoing recovery are:: get better          Expected Discharge Plan and Services       Living arrangements for the past 2 months: Single Family Home                                      Prior Living Arrangements/Services Living arrangements for the past 2 months: Single Family Home Lives with:: Self                   Activities of Daily Living      Permission Sought/Granted                  Emotional Assessment Appearance:: Appears stated age Attitude/Demeanor/Rapport: Engaged Affect (typically observed): Appropriate Orientation: : Oriented to Self, Oriented to Place, Oriented to Situation Alcohol / Substance Use: Not Applicable Psych Involvement: No (comment)  Admission diagnosis:  Wound cellulitis [L03.90] Sepsis (HCC) [A41.9] Non-traumatic rhabdomyolysis [M62.82] Septic shock (HCC) [A41.9, R65.21] Patient Active Problem List    Diagnosis Date Noted   Malnutrition of moderate degree 11/29/2023   Hypovolemic shock (HCC) 11/27/2023   Rhabdomyolysis 11/27/2023   Pressure injury of skin 11/27/2023   AKI (acute kidney injury) (HCC) 11/27/2023   DVT (deep venous thrombosis) (HCC) 11/27/2023   GERD (gastroesophageal reflux disease) 11/27/2023   Transaminitis 11/27/2023   Sepsis (HCC) 11/26/2023   Anemia 10/19/2023   Abdominal pain 10/10/2023   Cough 10/10/2023   Abnormal gait 07/27/2023   Hypertension 07/10/2023   Blister of left leg 05/24/2023   Acute blood loss anemia 03/27/2023   Phantom pain after amputation of lower extremity (HCC) 03/27/2023   Adjustment disorder with mixed anxiety and depressed mood 03/15/2023   Above knee amputation of right lower extremity (HCC) 03/13/2023   CKD (chronic kidney disease) stage 3, GFR 30-59 ml/min (HCC) 03/13/2023   Cellulitis of right lower extremity 03/09/2023   Fever 03/09/2023   Non-traumatic rhabdomyolysis 03/09/2023   Bandemia 03/09/2023   Acute kidney injury superimposed on chronic kidney disease (HCC) 03/05/2023   Hyperkalemia 03/05/2023   Normocytic anemia 03/05/2023   History of prostate cancer 03/05/2023   Essential hypertension 03/05/2023   Metabolic acidosis, increased anion gap 03/05/2023   Critical limb ischemia of right lower extremity (HCC) 12/12/2022   Malignant  neoplasm of prostate (HCC) 03/18/2019   PCP:  Rica Records, FNP Pharmacy:   [facility] [address] [place] - [address] [place] [address] [place] [address] Phone: [phone] Fax: [phone]  [facility] [address] [place] [address] Phone: [phone] Fax: [phone]     Social Determinants of Health (SDOH) Social History: SDOH Screenings   Food Insecurity: Patient Unable To Answer (11/26/2023)  Recent Concern: Food Insecurity - Food Insecurity Present (10/09/2023)  Housing: Patient Unable To Answer  (10/09/2023)  Transportation Needs: No Transportation Needs (10/09/2023)  Utilities: Patient Unable To Answer (11/26/2023)  Depression (PHQ2-9): Low Risk  (10/10/2023)  Financial Resource Strain: Medium Risk (10/09/2023)  Physical Activity: Unknown (10/09/2023)  Social Connections: Moderately Integrated (10/09/2023)  Stress: No Stress Concern Present (10/09/2023)  Tobacco Use: Medium Risk (11/26/2023)   SDOH Interventions:     Readmission Risk Interventions     No data to display

## 2023-11-29 NOTE — Plan of Care (Signed)
  Problem: Education: Goal: Knowledge of General Education information will improve Description: Including pain rating scale, medication(s)/side effects and non-pharmacologic comfort measures Outcome: Progressing   Problem: Health Behavior/Discharge Planning: Goal: Ability to manage health-related needs will improve Outcome: Progressing   Problem: Clinical Measurements: Goal: Ability to maintain clinical measurements within normal limits will improve Outcome: Progressing Goal: Will remain free from infection Outcome: Progressing Goal: Diagnostic test results will improve Outcome: Progressing Goal: Respiratory complications will improve Outcome: Progressing Goal: Cardiovascular complication will be avoided Outcome: Progressing   Problem: Activity: Goal: Risk for activity intolerance will decrease Outcome: Progressing   Problem: Nutrition: Goal: Adequate nutrition will be maintained Outcome: Progressing   Problem: Coping: Goal: Level of anxiety will decrease Outcome: Progressing   Problem: Elimination: Goal: Will not experience complications related to bowel motility Outcome: Progressing Goal: Will not experience complications related to urinary retention Outcome: Progressing   Problem: Pain Management: Goal: General experience of comfort will improve Outcome: Progressing   Problem: Safety: Goal: Ability to remain free from injury will improve Outcome: Progressing   Problem: Skin Integrity: Goal: Risk for impaired skin integrity will decrease Outcome: Progressing   Problem: Fluid Volume: Goal: Hemodynamic stability will improve Outcome: Progressing   Problem: Clinical Measurements: Goal: Diagnostic test results will improve Outcome: Progressing Goal: Signs and symptoms of infection will decrease Outcome: Progressing   Problem: Respiratory: Goal: Ability to maintain adequate ventilation will improve Outcome: Progressing   Problem: Education: Goal:  Knowledge of General Education information will improve Description: Including pain rating scale, medication(s)/side effects and non-pharmacologic comfort measures Outcome: Progressing   Problem: Health Behavior/Discharge Planning: Goal: Ability to manage health-related needs will improve Outcome: Progressing   Problem: Clinical Measurements: Goal: Ability to maintain clinical measurements within normal limits will improve Outcome: Progressing Goal: Will remain free from infection Outcome: Progressing Goal: Diagnostic test results will improve Outcome: Progressing Goal: Respiratory complications will improve Outcome: Progressing Goal: Cardiovascular complication will be avoided Outcome: Progressing   Problem: Activity: Goal: Risk for activity intolerance will decrease Outcome: Progressing   Problem: Nutrition: Goal: Adequate nutrition will be maintained Outcome: Progressing   Problem: Coping: Goal: Level of anxiety will decrease Outcome: Progressing   Problem: Elimination: Goal: Will not experience complications related to bowel motility Outcome: Progressing Goal: Will not experience complications related to urinary retention Outcome: Progressing   Problem: Pain Management: Goal: General experience of comfort will improve Outcome: Progressing   Problem: Safety: Goal: Ability to remain free from injury will improve Outcome: Progressing   Problem: Skin Integrity: Goal: Risk for impaired skin integrity will decrease Outcome: Progressing

## 2023-11-29 NOTE — Progress Notes (Signed)
Pt tachycardic in 120s. Pt resting comfortably in bed. No c/o SOB, chest pain, or discomfort. All other VS stable.

## 2023-11-29 NOTE — TOC Progression Note (Signed)
Transition of Care [facility]) - Progression Note    Patient Details  Name: [name] MRN: [id number] Date of Birth: 03-09-1951  Transition of Care [facility]) CM/SW Contact  Hari Casaus A [place], [place] Phone Number: 11/29/2023, 4:29 PM  Clinical Narrative:     CSW sent out referrals for SNF, workup completed. Bed offers pending.    TOC will continue to follow.   Expected Discharge Plan: Skilled Nursing Facility Barriers to Discharge: Continued Medical Work up, English as a second language teacher  Expected Discharge Plan and Services       Living arrangements for the past 2 months: Single Family Home                                       Social Determinants of Health (SDOH) Interventions SDOH Screenings   Food Insecurity: Patient Unable To Answer (11/26/2023)  Recent Concern: Food Insecurity - Food Insecurity Present (10/09/2023)  Housing: Patient Unable To Answer (10/09/2023)  Transportation Needs: No Transportation Needs (10/09/2023)  Utilities: Patient Unable To Answer (11/26/2023)  Depression (PHQ2-9): Low Risk  (10/10/2023)  Financial Resource Strain: Medium Risk (10/09/2023)  Physical Activity: Unknown (10/09/2023)  Social Connections: Moderately Integrated (10/09/2023)  Stress: No Stress Concern Present (10/09/2023)  Tobacco Use: Medium Risk (11/26/2023)    Readmission Risk Interventions     No data to display

## 2023-11-29 NOTE — Hospital Course (Addendum)
[name] is a 72 y.o. male with a history of hypertension, hyperlipidemia, PAD status post right AKA, CKD stage IIIa, GERD, prostate cancer status post prostatectomy.  Patient presented secondary to being found down on the ground and altered and was found to have evidence of hypotension concerning for septic shock with additional evidence of rhabdomyolysis.  Patient started empirically on antibiotics, started on vasopressor support, and IV fluids and was transferred to [facility] from [facility] for ICU admission.  Antibiotics were discontinued secondary to unlikely infectious etiology.  Hypotension and rhabdomyolysis improved with IV fluids and vasopressor support was discontinued.  Admission complicated by the identification of metastatic disease of unknown primary. Calcium worsening s/p Zometa.

## 2023-11-29 NOTE — Progress Notes (Signed)
PHARMACY - ANTICOAGULATION CONSULT NOTE  Pharmacy Consult for heparin  Indication: DVT  Allergies  Allergen Reactions   Shellfish Allergy Swelling    Patient Measurements: Height: 5\' 10"  (177.8 cm) Weight: 95.9 kg (211 lb 6.7 oz) IBW/kg (Calculated) : 73 Heparin Dosing Weight: 91.3kg   Vital Signs: Temp: 98.7 F (37.1 C) (12/05 0757) Temp Source: Oral (12/05 0757) BP: 122/70 (12/05 0757) Pulse Rate: 94 (12/05 0757)  Labs: Recent Labs    11/26/23 1033 11/27/23 0341 11/27/23 0859 11/27/23 2233 11/28/23 0324 11/28/23 1432 11/29/23 0659  HGB 9.6* 7.1*  --   --  6.7* 8.5* 8.4*  HCT 30.7* 22.4*  --   --  21.3* 27.7* 26.1*  PLT 313 199  --   --  210  --  257  HEPARINUNFRC  --   --   --  0.61 0.54  --  0.35  CREATININE 2.21*  --  1.38*  --  1.19  --  0.97  CKTOTAL 6,213* 3,076*  --   --   --   --   --     Estimated Creatinine Clearance: 78.9 mL/min (by C-G formula based on SCr of 0.97 mg/dL).   Medical History: Past Medical History:  Diagnosis Date   Family history of metabolic acidosis with increased anion gap 03/05/2023   GERD (gastroesophageal reflux disease)    History of kidney stones    Hx of AKA (above knee amputation), right (HCC)    Hyperlipidemia    Hypertension    Metastatic disease (HCC)    PAD (peripheral artery disease) (HCC)    S/p R CFA and PTA bypass 11/2022, critical limb ischemia s/p R AKA 02/2023   Prostate cancer [facility]) 2011   radiation june 2020   Septic shock (HCC) 11/26/2023   Umbilical hernia    Wears dentures    full   Wears glasses    Wears partial dentures    lower    Assessment: Patient admitted following a fall and was found down after 48 hours. Vascular US showing age indeterminate DVT in left femoral vein, left popliteal vein, left posterior tibial veins, and left peroneal veins. Pharmacy consulted to dose heparin.   Hgb improved to 8.4 and PLTc stable at 257. Heparin level therapeutic at 0.35 on 1500 units/hr, however this  is lower end of therapeutic. No issues with bleeding or line per RN.   Goal of Therapy:  Heparin level 0.3-0.7 units/ml Monitor platelets by anticoagulation protocol: Yes   Plan:  Increase heparin to 1550 units/hr  Daily heparin level and CBC Pharmacy will continue to follow   Lennie Muckle, PharmD PGY1 Pharmacy Resident 11/29/2023 8:46 AM

## 2023-11-29 NOTE — [place] FL2 (Signed)
[place] MEDICAID FL2 LEVEL OF CARE FORM     IDENTIFICATION  Patient Name: [name] Birthdate: 15-Aug-1951 Sex: male Admission Date (Current Location): 11/26/2023  [facility] and [place] Number:  [employer] and Address:  [facility], [address] [place], [place] [address]      Provider Number: [id number]  Attending Physician Name and Address:  Narda Bonds, MD  Relative Name and Phone Number:  [name] (Sister)  [phone]    Current Level of Care: Hospital Recommended Level of Care: Skilled Nursing Facility Prior Approval Number:    Date Approved/Denied:   PASRR Number: [id number]  Discharge Plan: SNF    Current Diagnoses: Patient Active Problem List   Diagnosis Date Noted   Malnutrition of moderate degree 11/29/2023   Hypovolemic shock (HCC) 11/27/2023   Rhabdomyolysis 11/27/2023   Pressure injury of skin 11/27/2023   AKI (acute kidney injury) (HCC) 11/27/2023   DVT (deep venous thrombosis) (HCC) 11/27/2023   GERD (gastroesophageal reflux disease) 11/27/2023   Transaminitis 11/27/2023   Sepsis (HCC) 11/26/2023   Anemia 10/19/2023   Abdominal pain 10/10/2023   Cough 10/10/2023   Abnormal gait 07/27/2023   Hypertension 07/10/2023   Blister of left leg 05/24/2023   Acute blood loss anemia 03/27/2023   Phantom pain after amputation of lower extremity (HCC) 03/27/2023   Adjustment disorder with mixed anxiety and depressed mood 03/15/2023   Above knee amputation of right lower extremity (HCC) 03/13/2023   CKD (chronic kidney disease) stage 3, GFR 30-59 ml/min (HCC) 03/13/2023   Cellulitis of right lower extremity 03/09/2023   Fever 03/09/2023   Non-traumatic rhabdomyolysis 03/09/2023   Bandemia 03/09/2023   Acute kidney injury superimposed on chronic kidney disease (HCC) 03/05/2023   Hyperkalemia 03/05/2023   Normocytic anemia 03/05/2023   History of prostate cancer 03/05/2023   Essential hypertension  03/05/2023   Metabolic acidosis, increased anion gap 03/05/2023   Critical limb ischemia of right lower extremity (HCC) 12/12/2022   Malignant neoplasm of prostate (HCC) 03/18/2019    Orientation RESPIRATION BLADDER Height & Weight     Self, Place, Situation  Normal Incontinent, External catheter Weight: 211 lb 6.7 oz (95.9 kg) Height:  5\' 10"  (177.8 cm)  BEHAVIORAL SYMPTOMS/MOOD NEUROLOGICAL BOWEL NUTRITION STATUS      Incontinent Diet (see DC summary)  AMBULATORY STATUS COMMUNICATION OF NEEDS Skin   Limited Assist Verbally Other (Comment), PU Stage and Appropriate Care (Thigh Right;Posterior.Irritant Dermatitis Scrotum Bilateral ulcerations. Incontinence Associated Dermatitis Perineum Bilateral ulcerations. Stage 2 - Partial thickness shallow open injury with a red, pink wound bed without slough.)   PU Stage 2 Dressing:  (Apply foam dressing, change every 3 days or if is saturated)                   Personal Care Assistance Level of Assistance  Bathing, Feeding, Dressing Bathing Assistance: Maximum assistance Feeding assistance: Limited assistance Dressing Assistance: Maximum assistance     Functional Limitations Info  Sight, Hearing, Speech Sight Info: Impaired (wears glasses) Hearing Info: Adequate Speech Info: Adequate    SPECIAL CARE FACTORS FREQUENCY  PT (By licensed PT), OT (By licensed OT)     PT Frequency: 5x/week OT Frequency: 5x/week            Contractures Contractures Info: Not present    Additional Factors Info  Code Status, Allergies Code Status Info: FULL Allergies Info: Shellfish Allergy           Current Medications (  11/29/2023):  This is the current hospital active medication list Current Facility-Administered Medications  Medication Dose Route Frequency Provider Last Rate Last Admin   0.9 %  sodium chloride infusion (Manually program via Guardrails IV Fluids)   Intravenous Once Paliwal, Aditya, MD       aspirin chewable tablet 81 mg  81  mg Oral Daily Cheri Fowler, MD   81 mg at 11/29/23 1041   Chlorhexidine Gluconate Cloth 2 % PADS 6 each  6 each Topical Daily Cheri Fowler, MD   6 each at 11/29/23 1557   docusate sodium (COLACE) capsule 100 mg  100 mg Oral BID PRN Cheri Fowler, MD       feeding supplement (ENSURE ENLIVE / ENSURE PLUS) liquid 237 mL  237 mL Oral BID BM Narda Bonds, MD   237 mL at 11/29/23 1300   Gerhardt's butt cream   Topical BID Narda Bonds, MD       heparin ADULT infusion 100 units/mL (25000 units/276mL)  1,550 Units/hr Intravenous Continuous Klus, Jesse L, RPH 15.5 mL/hr at 11/29/23 1041 1,550 Units/hr at 11/29/23 1041   multivitamin with minerals tablet 1 tablet  1 tablet Oral Daily Narda Bonds, MD   1 tablet at 11/29/23 1259   Oral care mouth rinse  15 mL Mouth Rinse PRN Agarwala, Daleen Bo, MD       oxyCODONE (Oxy IR/ROXICODONE) immediate release tablet 2.5 mg  2.5 mg Oral Q6H PRN Paliwal, Aditya, MD       Or   oxyCODONE (Oxy IR/ROXICODONE) immediate release tablet 5 mg  5 mg Oral Q6H PRN Paliwal, Aditya, MD   5 mg at 11/28/23 2207   polyethylene glycol (MIRALAX / GLYCOLAX) packet 17 g  17 g Oral Daily PRN Cheri Fowler, MD       Facility-Administered Medications Ordered in Other Encounters  Medication Dose Route Frequency Provider Last Rate Last Admin   0.9 %  sodium chloride infusion (Manually program via Guardrails IV Fluids)  250 mL Intravenous Continuous Doreatha Massed, MD   Stopped at 10/25/23 1356     Discharge Medications: Please see discharge summary for a list of discharge medications.  Relevant Imaging Results:  Relevant Lab Results:   Additional Information SSN:[id number]  Suzann Lazaro A [place], [place]

## 2023-11-29 NOTE — Progress Notes (Signed)
Notified Dr. Caleb Popp of pts edematous R arm. Pt reporting it felt heavy and weak. Heparin drip and LR had been infusing in arm but new IV placed to L arm for infusions. Removed IVs in R arm. Pt's sister said it has been swollen since admission because when she found him at home in the floor, it was bent behind his back. Notified Dr. Caleb Popp.

## 2023-11-29 NOTE — Evaluation (Signed)
Occupational Therapy Evaluation Patient Details Name: [name] MRN: [id number] DOB: February 03, 1951 Today's Date: 11/29/2023   History of Present Illness 72 y.o. male admitted 11/26/23 found down with 2-3 day h/o weakness and fall. Workup for rhabdomyolysis, AKI, septic shock. Imaging with L scalp hematoma. CT findings consistent with metastatic disease (multiple subQ masses, bilateral adrenal masses, R RP masses and multiple osseous metastases) with largest osseous metastasis of T10 vertebral body with cord compression S/p biopsy L abdominal wall mass 12/4. PMH includes prostate CA, PAD, HTN, HLD, R AKA (03/09/23).   Clinical Impression   Pt c/o pain to L leg at rest 8/10, very tired. Pt lives alone, states his sister is a retired Engineer, civil (consulting) and could assist 24/7 if needed. Pt PLOF mod I using w/c, currently max A and effort just to roll L/R. Pt declined scooting to EOB or attempting transfers, feels like he could not complete right now. Pt grossly A/Ox4 but appears to be unsure of current level of function, not aware of deficits. Pt would benefit from postacute rehab <3hrs/day to maximize independence with transfers/ADLs and safety awareness. Pt states he is open to going somewhere for rehab if needed. Pt will be followed acutely during stay to monitor progress.       If plan is discharge home, recommend the following: Two people to help with walking and/or transfers;Two people to help with bathing/dressing/bathroom;Assistance with cooking/housework;Assistance with feeding;Assist for transportation;Help with stairs or ramp for entrance    Functional Status Assessment  Patient has had a recent decline in their functional status and demonstrates the ability to make significant improvements in function in a reasonable and predictable amount of time.  Equipment Recommendations  Other (comment) (defer)    Recommendations for Other Services       Precautions / Restrictions  Precautions Precautions: Fall Restrictions Weight Bearing Restrictions: No      Mobility Bed Mobility Overal bed mobility: Needs Assistance Bed Mobility: Rolling Rolling: Max assist         General bed mobility comments: Pt declined scooting to EOB, max effort to roll L/R    Transfers Overall transfer level: Needs assistance                 General transfer comment: declined      Balance                                           ADL either performed or assessed with clinical judgement   ADL Overall ADL's : Needs assistance/impaired Eating/Feeding: Minimal assistance;Bed level   Grooming: Minimal assistance;Bed level   Upper Body Bathing: Maximal assistance;Bed level   Lower Body Bathing: Maximal assistance;Bed level   Upper Body Dressing : Moderate assistance;Bed level   Lower Body Dressing: Maximal assistance;Bed level       Toileting- Clothing Manipulation and Hygiene: Maximal assistance;Bed level         General ADL Comments: Pt declined EOB activities, max effort for bed mobility, RUE swelling limits ability to complete GM/FM tasks.     Vision Baseline Vision/History: 1 Wears glasses Ability to See in Adequate Light: 0 Adequate Patient Visual Report: No change from baseline       Perception         Praxis         Pertinent Vitals/Pain Pain Assessment Pain Assessment: 0-10 Pain Score: 8  Pain Location: L  leg Pain Descriptors / Indicators: Aching, Discomfort Pain Intervention(s): Monitored during session     Extremity/Trunk Assessment Upper Extremity Assessment Upper Extremity Assessment: RUE deficits/detail RUE Deficits / Details: swelling noted to forearm/hand, noticable marking on 1st digit fingernail spanning from cuticle to distal end through middle of nail. RUE Sensation: WNL RUE Coordination: decreased fine motor;decreased gross motor   Lower Extremity Assessment Lower Extremity Assessment: Defer to  PT evaluation       Communication Communication Communication: No apparent difficulties   Cognition Arousal: Alert Behavior During Therapy: WFL for tasks assessed/performed Overall Cognitive Status: Within Functional Limits for tasks assessed                                       General Comments       Exercises     Shoulder Instructions      Home Living Family/patient expects to be discharged to:: Private residence Living Arrangements: Alone Available Help at Discharge: Family;Friend(s);Available PRN/intermittently Type of Home: House Home Access: Ramped entrance     Home Layout: One level     Bathroom Shower/Tub: Walk-in shower;Tub/shower unit   Bathroom Toilet: Handicapped height Bathroom Accessibility: Yes How Accessible: Accessible via wheelchair Home Equipment: BSC/3in1;Rolling Walker (2 wheels);Cane - single point;Wheelchair - manual   Additional Comments: lives alone, has a sister who can be there 24/7, also states he has a friend who is around sometimes      Prior Functioning/Environment Prior Level of Function : Independent/Modified Independent             Mobility Comments: uses w/c around home, states he transfers independently ADLs Comments: ind with ADLs, states he cooks and cleans        OT Problem List: Decreased strength;Decreased range of motion;Decreased activity tolerance;Impaired balance (sitting and/or standing);Decreased safety awareness;Pain;Increased edema;Impaired UE functional use      OT Treatment/Interventions: Self-care/ADL training;Therapeutic exercise;Energy conservation;DME and/or AE instruction;Manual therapy;Therapeutic activities;Patient/family education;Balance training    OT Goals(Current goals can be found in the care plan section) Acute Rehab OT Goals Patient Stated Goal: to improve strength OT Goal Formulation: With patient Time For Goal Achievement: 12/13/23 Potential to Achieve Goals: Good  OT  Frequency: Min 1X/week    Co-evaluation              AM-PAC OT "6 Clicks" Daily Activity     Outcome Measure Help from another person eating meals?: A Little Help from another person taking care of personal grooming?: A Little Help from another person toileting, which includes using toliet, bedpan, or urinal?: A Lot Help from another person bathing (including washing, rinsing, drying)?: A Lot Help from another person to put on and taking off regular upper body clothing?: A Lot Help from another person to put on and taking off regular lower body clothing?: A Lot 6 Click Score: 14   End of Session Nurse Communication: Mobility status  Activity Tolerance: Patient limited by lethargy;Patient limited by pain Patient left: in bed;with call bell/phone within reach;with bed alarm set  OT Visit Diagnosis: Unsteadiness on feet (R26.81);Repeated falls (R29.6);Muscle weakness (generalized) (M62.81);History of falling (Z91.81);Pain Pain - Right/Left: Left Pain - part of body: Leg                Time: 1610-9604 OT Time Calculation (min): 25 min Charges:  OT General Charges $OT Visit: 1 Visit OT Evaluation $OT Eval Moderate Complexity: 1 Mod OT Treatments $  Self Care/Home Management : 8-22 mins  [place], OTR/L   Alexis Goodell 11/29/2023, 12:33 PM

## 2023-11-29 NOTE — Consult Note (Addendum)
WOC Nurse Consult Note: Reason for Consult: Requested to assess wounds. Wound type: MASD on the left groin. Left leg close to ankle stage 2 Inner gluteo stage 2, caused by friction. Stage 3 on the penis. Looks atypical, possible carcinoma? Performed remotely after assess the photos and the notes.  Dressing procedure/placement/frequency: Left leg and inner gluteo: Apply foam dressing, change every 3 days or if is saturated. MASD groin: apply Gerhadt cream BID or when cleaning. Apply anti fungal powder after. Penis: wrap with xeroform - if aggressive care is desire, please consult a urologist.    WOC team will not plan to follow further.  Please reconsult if further assistance is needed. Thank-you,  Denyse Amass BSN, RN, [employer], WOC  (Pager: [phone])

## 2023-11-30 ENCOUNTER — Inpatient Hospital Stay (HOSPITAL_COMMUNITY): Payer: Medicare Other

## 2023-11-30 DIAGNOSIS — Z860101 Personal history of adenomatous and serrated colon polyps: Secondary | ICD-10-CM | POA: Diagnosis not present

## 2023-11-30 DIAGNOSIS — R571 Hypovolemic shock: Secondary | ICD-10-CM | POA: Diagnosis not present

## 2023-11-30 DIAGNOSIS — A419 Sepsis, unspecified organism: Secondary | ICD-10-CM | POA: Diagnosis not present

## 2023-11-30 DIAGNOSIS — Z8546 Personal history of malignant neoplasm of prostate: Secondary | ICD-10-CM

## 2023-11-30 DIAGNOSIS — E440 Moderate protein-calorie malnutrition: Secondary | ICD-10-CM | POA: Diagnosis not present

## 2023-11-30 DIAGNOSIS — C799 Secondary malignant neoplasm of unspecified site: Secondary | ICD-10-CM

## 2023-11-30 DIAGNOSIS — Z923 Personal history of irradiation: Secondary | ICD-10-CM

## 2023-11-30 DIAGNOSIS — M7989 Other specified soft tissue disorders: Secondary | ICD-10-CM

## 2023-11-30 DIAGNOSIS — N179 Acute kidney failure, unspecified: Secondary | ICD-10-CM | POA: Diagnosis not present

## 2023-11-30 DIAGNOSIS — D649 Anemia, unspecified: Secondary | ICD-10-CM | POA: Diagnosis not present

## 2023-11-30 DIAGNOSIS — R6521 Severe sepsis with septic shock: Secondary | ICD-10-CM | POA: Diagnosis not present

## 2023-11-30 LAB — COMPREHENSIVE METABOLIC PANEL
ALT: 51 U/L — ABNORMAL HIGH (ref 0–44)
AST: 58 U/L — ABNORMAL HIGH (ref 15–41)
Albumin: <1.5 g/dL — ABNORMAL LOW (ref 3.5–5.0)
Alkaline Phosphatase: 72 U/L (ref 38–126)
Anion gap: 7 (ref 5–15)
BUN: 24 mg/dL — ABNORMAL HIGH (ref 8–23)
CO2: 22 mmol/L (ref 22–32)
Calcium: 9.9 mg/dL (ref 8.9–10.3)
Chloride: 104 mmol/L (ref 98–111)
Creatinine, Ser: 0.97 mg/dL (ref 0.61–1.24)
GFR, Estimated: >60 mL/min (ref >60)
Glucose, Bld: 128 mg/dL — ABNORMAL HIGH (ref 70–99)
Potassium: 3.9 mmol/L (ref 3.5–5.1)
Sodium: 133 mmol/L — ABNORMAL LOW (ref 135–145)
Total Bilirubin: 0.4 mg/dL (ref <1.2)
Total Protein: 4.7 g/dL — ABNORMAL LOW (ref 6.5–8.1)

## 2023-11-30 LAB — SURGICAL PATHOLOGY

## 2023-11-30 LAB — CBC WITH DIFFERENTIAL/PLATELET
Abs Immature Granulocytes: 0.3 10*3/uL — ABNORMAL HIGH (ref 0.00–0.07)
Basophils Absolute: 0 10*3/uL (ref 0.0–0.1)
Basophils Relative: 0 %
Eosinophils Absolute: 0.1 10*3/uL (ref 0.0–0.5)
Eosinophils Relative: 0 %
HCT: 23 % — ABNORMAL LOW (ref 39.0–52.0)
Hemoglobin: 7.4 g/dL — ABNORMAL LOW (ref 13.0–17.0)
Immature Granulocytes: 2 %
Lymphocytes Relative: 17 %
Lymphs Abs: 2.2 10*3/uL (ref 0.7–4.0)
MCH: 28.2 pg (ref 26.0–34.0)
MCHC: 32.2 g/dL (ref 30.0–36.0)
MCV: 87.8 fL (ref 80.0–100.0)
Monocytes Absolute: 0.9 10*3/uL (ref 0.1–1.0)
Monocytes Relative: 7 %
Neutro Abs: 9 10*3/uL — ABNORMAL HIGH (ref 1.7–7.7)
Neutrophils Relative %: 74 %
Platelets: 256 10*3/uL (ref 150–400)
RBC: 2.62 MIL/uL — ABNORMAL LOW (ref 4.22–5.81)
RDW: 17.6 % — ABNORMAL HIGH (ref 11.5–15.5)
WBC: 12.5 10*3/uL — ABNORMAL HIGH (ref 4.0–10.5)
nRBC: 0 % (ref 0.0–0.2)

## 2023-11-30 LAB — HEPARIN LEVEL (UNFRACTIONATED)
Heparin Unfractionated: 0.21 [IU]/mL — ABNORMAL LOW (ref 0.30–0.70)
Heparin Unfractionated: 0.24 [IU]/mL — ABNORMAL LOW (ref 0.30–0.70)

## 2023-11-30 LAB — HEMOGLOBIN AND HEMATOCRIT, BLOOD
HCT: 25.5 % — ABNORMAL LOW (ref 39.0–52.0)
Hemoglobin: 8.2 g/dL — ABNORMAL LOW (ref 13.0–17.0)

## 2023-11-30 MED ORDER — LACTATED RINGERS IV BOLUS
500.0000 mL | INTRAVENOUS | Status: AC
  Administered 2023-11-30: 500 mL via INTRAVENOUS

## 2023-11-30 MED ORDER — SODIUM CHLORIDE 0.9 % IV SOLN
2.0000 g | Freq: Three times a day (TID) | INTRAVENOUS | Status: DC
  Administered 2023-11-30 – 2023-12-03 (×10): 2 g via INTRAVENOUS
  Filled 2023-11-30 (×11): qty 12.5

## 2023-11-30 MED ORDER — GADOBUTROL 1 MMOL/ML IV SOLN
9.0000 mL | Freq: Once | INTRAVENOUS | Status: AC | PRN
  Administered 2023-11-30: 9 mL via INTRAVENOUS

## 2023-11-30 MED ORDER — VANCOMYCIN HCL IN DEXTROSE 1-5 GM/200ML-% IV SOLN
1000.0000 mg | Freq: Once | INTRAVENOUS | Status: AC
  Administered 2023-11-30: 1000 mg via INTRAVENOUS
  Filled 2023-11-30: qty 200

## 2023-11-30 MED ORDER — ACETAMINOPHEN 325 MG PO TABS
650.0000 mg | ORAL_TABLET | Freq: Four times a day (QID) | ORAL | Status: DC | PRN
  Administered 2023-11-30 – 2023-12-10 (×5): 650 mg via ORAL
  Filled 2023-11-30 (×5): qty 2

## 2023-11-30 MED ORDER — PANTOPRAZOLE SODIUM 40 MG IV SOLR: 40.0000 mg | Freq: Two times a day (BID) | INTRAVENOUS | Status: DC

## 2023-11-30 MED ORDER — VANCOMYCIN HCL 750 MG/150ML IV SOLN
750.0000 mg | Freq: Two times a day (BID) | INTRAVENOUS | Status: DC
Start: 2023-11-30 — End: 2023-12-03
  Administered 2023-11-30 – 2023-12-03 (×6): 750 mg via INTRAVENOUS
  Filled 2023-11-30 (×6): qty 150

## 2023-11-30 MED ORDER — LACTATED RINGERS IV SOLN: INTRAVENOUS | Status: AC

## 2023-11-30 MED ORDER — VANCOMYCIN HCL 2000 MG/400ML IV SOLN: 2000.0000 mg | Freq: Once | INTRAVENOUS | Status: DC

## 2023-11-30 MED ORDER — NYSTATIN 100000 UNIT/GM EX POWD
Freq: Two times a day (BID) | CUTANEOUS | Status: DC
  Administered 2023-12-08 – 2023-12-10 (×2): 1 via TOPICAL
  Filled 2023-11-30: qty 15

## 2023-11-30 NOTE — Progress Notes (Signed)
BUE venous duplex has been completed.  Preliminary findings given to Dr. Caleb Popp.    Results can be found under chart review under CV PROC. 11/30/2023 12:58 PM Keshonna Valvo RVT, RDMS

## 2023-11-30 NOTE — Consult Note (Addendum)
Referring Provider: Dr. Malachy Mood Primary Care Physician:  Rica Records, FNP Primary Gastroenterologist:  Dr. Jena Gauss   Reason for Consultation:  Oncology requesting EGD to rule out UGI malignancy, anemia   HPI: [name] is a 72 y.o. male with a past medical history of hypertension, hyperlipidemia, peripheral arterial disease s/p R CFA and PTA bypass 11/2022 and critical limb ischemia s/p R AKA 02/2023, kidney stones, anemia, prostate cancer s/p radiation 2020 with bone metastasis, chronic RLE DVT, GERD and tubular adenomatous colon polyps.   Patient presented to the ED 11/26/2023 with altered mental status with recent fall at home. He presented with a wound over the scrotum/perineum. Labs in the ED showed a WBC count of 14.4. Hemoglobin 9.6.  BUN 97.  Creatinine 2.21.  Albumin 2.6.  AST 149.  ALT 69.  CK 6948.). He was hypotensive, received aggressive IV fluid resuscitation for rhabdomyolysis. He met criteria for SIRS and was placed on antibiotics. CT head/face and cervical spine were unremarkable for acute traumatic injury. CTAP showed metastatic disease with multiple subcutaneous metastases, bilateral adrenal masses, right retroperitoneal mass and multiple osseous metastases the largest of which involve the T10 vertebral body with possible thoracic cord compression.  He was initially admitted to the ICU with suspected bacteremia in setting of scrotal infection and progressive metastatic disease. His clinical status stabilized and he was transferred to 2W.  He was found to have an indeterminate DVT involving the left femoral vein, left popliteal vein, left posterior tibial veins and left peroneal veins and chronic DVT involving the right femoral vein. In setting of presumed metastatic cancer,  he was started on Heparin infusion. He was seen by oncologist Dr. Mosetta Putt, recommended biopsy of LLQ abdominal wall mass to clarify primary malignancy. S/P US guided biopsy of LLQ mass on 12/4,  path path report showed adenocarcinoma, primary etiology remains unclear, possible GI etiology.  A GI consult was requested by Dr. Mosetta Putt to rule out primary upper GI malignancy in setting of significant anemia.   He denies having any nausea or vomiting.  No dysphagia or heartburn.  Denies ever having an EGD.  He typically passes a normal brown formed bowel movement daily at home.  No rectal bleeding or black stools.  He underwent a colonoscopy diet Dr. Work 08/2020 which identified 2 tubular adenomatous polyps removed from the colon.  No known family history of esophageal, gastric or colon cancer.  Non-smoker.  No alcohol use. Patient's sister/POA Thurston Hole at the bedside with several other family members.  GI PROCEDURES:  Colonoscopy 08/25/2020 by Dr. Jena Gauss: - Three 4 to 5 mm polyps in the ascending colon, removed with a cold snare. Resected and retrieved. - The examination was otherwise normal on direct and retroflexion views. A. COLON, ASCENDING, POLYPECTOMY:  -  Tubular adenoma (2 of 3 fragments)  -  Benign colonic mucosa (1 of 3 fragments)  -  No high grade dysplasia or malignancy identified   ECHO 11/27/2023: IMPRESSIONS Left ventricular ejection fraction, by estimation, is 60 to 65%. The left ventricle has normal function. The left ventricle has no regional wall motion abnormalities. There is mild left ventricular hypertrophy. Left ventricular diastolic parameters were normal. 1. Right ventricular systolic function is hyperdynamic. The right ventricular size is normal. Tricuspid regurgitation signal is inadequate for assessing PA pressure. 2. The mitral valve is normal in structure. No evidence of mitral valve regurgitation. No evidence of mitral stenosis. 3. Abnormal echodensity seen in the right atrium only in subcostal  view. This is superior to the tricuspid valve annulus. Cannot exclude normal variant anatomy, only seen in clip 65. The tricuspid valve is abnormal. 4. The aortic valve is tricuspid.  Aortic valve regurgitation is mild to moderate. Aortic valve sclerosis is present, with no evidence of aortic valve stenosis. 5. The inferior vena cava is dilated in size with <50% respiratory variability, suggesting right atrial pressure of 15 mmHg  Past Medical History:  Diagnosis Date   Family history of metabolic acidosis with increased anion gap 03/05/2023   GERD (gastroesophageal reflux disease)    History of kidney stones    Hx of AKA (above knee amputation), right (HCC)    Hyperlipidemia    Hypertension    Metastatic disease (HCC)    PAD (peripheral artery disease) (HCC)    S/p R CFA and PTA bypass 11/2022, critical limb ischemia s/p R AKA 02/2023   Prostate cancer [facility]) 2011   radiation june 2020   Septic shock (HCC) 11/26/2023   Umbilical hernia    Wears dentures    full   Wears glasses    Wears partial dentures    lower    Past Surgical History:  Procedure Laterality Date   ABDOMINAL AORTOGRAM W/LOWER EXTREMITY N/A 11/20/2022   Procedure: ABDOMINAL AORTOGRAM W/LOWER EXTREMITY;  Surgeon: Maeola Harman, MD;  Location: [facility] INVASIVE CV LAB;  Service: Cardiovascular;  Laterality: N/A;   ABDOMINAL AORTOGRAM W/LOWER EXTREMITY N/A 03/05/2023   Procedure: ABDOMINAL AORTOGRAM W/LOWER EXTREMITY;  Surgeon: Maeola Harman, MD;  Location: [facility] INVASIVE CV LAB;  Service: Cardiovascular;  Laterality: N/A;   AMPUTATION Right 03/09/2023   Procedure: AMPUTATION ABOVE KNEE;  Surgeon: Maeola Harman, MD;  Location: [facility] OR;  Service: Vascular;  Laterality: Right;   COLONOSCOPY N/A 08/25/2020   Procedure: COLONOSCOPY;  Surgeon: Corbin Ade, MD;  [facility];  Service: Endoscopy;  Laterality: N/A;  9:30   COLONOSCOPY     CYSTOSCOPY WITH LITHOLAPAXY N/A 05/18/2021   Procedure: CYSTOSCOPY WITH LITHOLAPAXY;  Surgeon: Rene Paci, MD;  Location: [facility];  Service: Urology;  Laterality: N/A;  ONLY NEEDS  30 MIN    FEMORAL-TIBIAL BYPASS GRAFT Right 12/12/2022   Procedure: RIGHT COMMON FEMORAL-POSTERIOR TIBIAL ARTERY BYPASS WITH VEIN HARVESTING OF THE GREATER SAPHENOUS VEIN AND FEMORAL ENDARTERECTOMY WITH COMPOSITE GRAFT;  Surgeon: Maeola Harman, MD;  Location: [facility] OR;  Service: Vascular;  Laterality: Right;   IR US GUIDE BX ASP/DRAIN  11/28/2023   POLYPECTOMY  08/25/2020   Procedure: POLYPECTOMY;  Surgeon: Corbin Ade, MD;  Location: [facility];  Service: Endoscopy;;   ROBOT ASSISTED LAPAROSCOPIC RADICAL PROSTATECTOMY  2011   TONSILLECTOMY  age 46   and adenoids removed    Prior to Admission medications   Medication Sig Start Date End Date Taking? Authorizing Provider  acetaminophen (TYLENOL) 325 MG tablet Take 1-2 tablets (325-650 mg total) by mouth every 4 (four) hours as needed for mild pain. 03/26/23  Yes Love, Evlyn Kanner, PA-C  albuterol (VENTOLIN HFA) 108 (90 Base) MCG/ACT inhaler Inhale 2 puffs into the lungs every 6 (six) hours as needed for wheezing or shortness of breath. 10/19/23  Yes Del Newman Nip, Tenna Child, FNP  atorvastatin (LIPITOR) 40 MG tablet Take 1 tablet (40 mg total) by mouth every morning. 04/05/23  Yes Maeola Harman, MD  benzonatate (TESSALON) 200 MG capsule Take 1 capsule (200 mg total) by mouth 2 (two) times daily as needed for cough. 10/10/23  Yes Del  Newman Nip, Tenna Child, FNP  cyclobenzaprine (FLEXERIL) 5 MG tablet Take 1 tablet (5 mg total) by mouth 3 (three) times daily as needed for muscle spasms. 04/25/23  Yes Lovorn, Aundra Millet, MD  gabapentin (NEURONTIN) 100 MG capsule Take 2 capsules (200 mg total) by mouth 2 (two) times daily. Along with 300 mg nightly- for phantom pain- due to R AKA 11/16/23  Yes Lovorn, Aundra Millet, MD  iron polysaccharides (NIFEREX) 150 MG capsule Take 1 capsule (150 mg total) by mouth daily. 10/19/23  Yes Del Newman Nip, Tenna Child, FNP  levocetirizine (XYZAL) 5 MG tablet Take 1 tablet (5 mg total) by mouth every evening. 05/24/23  Yes Del Newman Nip, Tenna Child, FNP  lisinopril-hydrochlorothiazide (ZESTORETIC) 10-12.5 MG tablet Take 1 tablet by mouth daily. 10/10/23  Yes Del Newman Nip, Tenna Child, FNP  metoprolol tartrate (LOPRESSOR) 25 MG tablet Take 0.5 tablets (12.5 mg total) by mouth 2 (two) times daily. Will give 1 month supply- and get from NP/PCP in future. 10/10/23  Yes Del Newman Nip, Tenna Child, FNP  Multiple Vitamins-Minerals (CENTRUM SILVER PO) Take 1 tablet by mouth daily.    Yes [provider]  oxybutynin (DITROPAN-XL) 10 MG 24 hr tablet Take 10 mg by mouth daily. 04/12/23  Yes [provider]  Oxycodone HCl 10 MG TABS Take 1 tablet (10 mg total) by mouth 3 (three) times daily as needed. Patient taking differently: Take 10 mg by mouth 3 (three) times daily as needed (for pain). 03/26/23  Yes Love, Evlyn Kanner, PA-C  pantoprazole (PROTONIX) 40 MG tablet Take 1 tablet (40 mg total) by mouth daily. 03/26/23  Yes Love, Evlyn Kanner, PA-C  polyethylene glycol powder (MIRALAX) 17 GM/SCOOP powder Take 17 g by mouth daily as needed for mild constipation. 10/10/23  Yes Del Newman Nip, Tenna Child, FNP  senna-docusate (SENOKOT-S) 8.6-50 MG tablet Take 2 tablets by mouth daily after supper. 03/26/23  Yes Love, Evlyn Kanner, PA-C  sodium bicarbonate 650 MG tablet Take 1 tablet (650 mg total) by mouth 2 (two) times daily. 03/26/23  Yes Love, Evlyn Kanner, PA-C  gabapentin (NEURONTIN) 300 MG capsule Take 1 capsule by mouth at bedtime Patient not taking: Reported on 11/28/2023 10/30/23   Genice Rouge, MD    Current Facility-Administered Medications  Medication Dose Route Frequency Provider Last Rate Last Admin   0.9 %  sodium chloride infusion (Manually program via Guardrails IV Fluids)   Intravenous Once Paliwal, Aditya, MD       acetaminophen (TYLENOL) tablet 650 mg  650 mg Oral Q6H PRN Janalyn Shy, Subrina, MD   650 mg at 11/30/23 0225   aspirin chewable tablet 81 mg  81 mg Oral Daily Cheri Fowler, MD   81 mg at 11/30/23 0837   ceFEPIme (MAXIPIME) 2 g  in sodium chloride 0.9 % 100 mL IVPB  2 g Intravenous Q8H Arabella Merles, RPH 200 mL/hr at 11/30/23 0325 2 g at 11/30/23 0325   Chlorhexidine Gluconate Cloth 2 % PADS 6 each  6 each Topical Daily Cheri Fowler, MD   6 each at 11/30/23 0841   docusate sodium (COLACE) capsule 100 mg  100 mg Oral BID PRN Cheri Fowler, MD       feeding supplement (ENSURE ENLIVE / ENSURE PLUS) liquid 237 mL  237 mL Oral BID BM Narda Bonds, MD   237 mL at 11/30/23 0841   Gerhardt's butt cream   Topical BID Narda Bonds, MD   Given at 11/30/23 [phone]   heparin ADULT infusion 100 units/mL (25000 units/248mL)  1,750 Units/hr Intravenous Continuous Vivia Birmingham, RPH 17.5 mL/hr at 11/30/23 0945 1,750 Units/hr at 11/30/23 0945   lactated ringers infusion   Intravenous Continuous Sundil, Subrina, MD 100 mL/hr at 11/30/23 0947 New Bag at 11/30/23 0947   multivitamin with minerals tablet 1 tablet  1 tablet Oral Daily Narda Bonds, MD   1 tablet at 11/30/23 1610   nystatin (MYCOSTATIN/NYSTOP) topical powder   Topical BID Tereasa Coop, MD   Given at 11/30/23 [phone]   Oral care mouth rinse  15 mL Mouth Rinse PRN Lynnell Catalan, MD       oxyCODONE (Oxy IR/ROXICODONE) immediate release tablet 2.5 mg  2.5 mg Oral Q6H PRN Conrad Nelson, MD       Or   oxyCODONE (Oxy IR/ROXICODONE) immediate release tablet 5 mg  5 mg Oral Q6H PRN Paliwal, Aditya, MD   5 mg at 11/30/23 0154   polyethylene glycol (MIRALAX / GLYCOLAX) packet 17 g  17 g Oral Daily PRN Cheri Fowler, MD       vancomycin (VANCOREADY) IVPB 750 mg/150 mL  750 mg Intravenous Q12H Arabella Merles, RPH       Facility-Administered Medications Ordered in Other Encounters  Medication Dose Route Frequency Provider Last Rate Last Admin   0.9 %  sodium chloride infusion (Manually program via Guardrails IV Fluids)  250 mL Intravenous Continuous Doreatha Massed, MD   Stopped at 10/25/23 1356    Allergies as of 11/26/2023 - Review Complete 11/26/2023  Allergen Reaction  Noted   Shellfish allergy Swelling 03/18/2019    Family History  Problem Relation Age of Onset   Gastric cancer Mother    Prostate cancer Father    Breast cancer Neg Hx    Pancreatic cancer Neg Hx     Social History   Socioeconomic History   Marital status: Single    Spouse name: Not on file   Number of children: 1   Years of education: Not on file   Highest education level: Some college, no degree  Occupational History    Comment: customer service   Tobacco Use   Smoking status: Former    Current packs/day: 0.00    Average packs/day: 0.3 packs/day for 21.0 years (5.3 ttl pk-yrs)    Types: Cigarettes    Start date: 11/29/2001    Quit date: 11/29/2022    Years since quitting: 1.0    Passive exposure: Never   Smokeless tobacco: Never  Vaping Use   Vaping status: Never Used  Substance and Sexual Activity   Alcohol use: Never   Drug use: Never   Sexual activity: Not Currently  Other Topics Concern   Not on file  Social History Narrative   Has one son.    Social Determinants of Health   Financial Resource Strain: Medium Risk (10/09/2023)   Overall Financial Resource Strain (CARDIA)    Difficulty of Paying Living Expenses: Somewhat hard  Food Insecurity: Patient Unable To Answer (11/26/2023)   Hunger Vital Sign    Worried About Running Out of Food in the Last Year: Patient unable to answer    Ran Out of Food in the Last Year: Patient unable to answer  Recent Concern: Food Insecurity - Food Insecurity Present (10/09/2023)   Hunger Vital Sign    Worried About Running Out of Food in the Last Year: Sometimes true    Ran Out of Food in the Last Year: Sometimes true  Transportation Needs: No Transportation Needs (10/09/2023)   PRAPARE - Transportation  Lack of Transportation (Medical): No    Lack of Transportation (Non-Medical): No  Physical Activity: Unknown (10/09/2023)   Exercise Vital Sign    Days of Exercise per Week: 0 days    Minutes of Exercise per Session:  Not on file  Stress: No Stress Concern Present (10/09/2023)   [employer] of Occupational Health - Occupational Stress Questionnaire    Feeling of Stress : Not at all  Social Connections: Moderately Integrated (10/09/2023)   Social Connection and Isolation Panel [NHANES]    Frequency of Communication with Friends and Family: More than three times a week    Frequency of Social Gatherings with Friends and Family: Never    Attends Religious Services: 1 to 4 times per year    Active Member of [employer] or Organizations: Yes    Attends Banker Meetings: Patient declined    Marital Status: Never married  Intimate Partner Violence: Not At Risk (11/30/2023)   Humiliation, Afraid, Rape, and Kick questionnaire    Fear of Current or Ex-Partner: No    Emotionally Abused: No    Physically Abused: No    Sexually Abused: No    Review of Systems: Gen: Denies fever, sweats or chills. No weight loss.  CV: Denies chest pain, palpitations or edema. Resp: Denies cough, shortness of breath of hemoptysis.  GI:See HPI. GU : Denies urinary burning, blood in urine, increased urinary frequency or incontinence. MS: R AKA.  Derm: Denies rash, itchiness, skin lesions or unhealing ulcers. Psych:  +Depression. Heme: Denies easy bruising, bleeding. Neuro:  Denies headaches, dizziness or paresthesias. Endo:  Denies any problems with DM, thyroid or adrenal function.  Physical Exam: Vital signs in last 24 hours: Temp:  [97.6 F (36.4 C)-101.3 F (38.5 C)] 99 F (37.2 C) (12/06 0924) Pulse Rate:  [84-125] 104 (12/06 0924) Resp:  [16-20] 16 (12/06 0924) BP: (104-117)/(48-70) 106/49 (12/06 0924) SpO2:  [93 %-97 %] 93 % (12/06 0924) Weight:  [96.6 kg] 96.6 kg (12/06 0421) Last BM Date : 11/30/23 General: Alert fatigued appearing 72 year old male in no acute distress. Head:  Normocephalic and atraumatic. Eyes:  No scleral icterus. Conjunctiva pink. Ears:  Normal auditory acuity. Nose:  No  deformity, discharge or lesions. Mouth:  Dentition intact. No ulcers or lesions.  Neck:  Supple. No lymphadenopathy or thyromegaly.  Lungs: Breath sounds clear throughout.  Decreased in the bases. Heart: Regular rate and rhythm, no murmurs. Abdomen: Abdomen is  protuberant, palpable mass to the left mid abdomen.  Nontender.  Positive bowel sounds to all 4 quadrants.  No bruit. Rectal: Deferred. Musculoskeletal:  Symmetrical without gross deformities.  Pulses:  Normal pulses noted. Extremities:  Right AKA.  Mild edema to upper extremities. Neurologic:  Alert and  oriented x 4. No focal deficits.  Skin: Scrotal wound dressing intact. Psych:  Alert and cooperative. Normal mood and affect.  Intake/Output from previous day: 12/05 0701 - 12/06 0700 In: 1004.9 [P.O.:360; I.V.:489.7; IV Piggyback:155.3] Out: 1825 [Urine:1825] Intake/Output this shift: No intake/output data recorded.  Lab Results: Recent Labs    11/28/23 0324 11/28/23 1432 11/29/23 0659 11/30/23 0528  WBC 10.3  --  10.7* 12.5*  HGB 6.7* 8.5* 8.4* 7.4*  HCT 21.3* 27.7* 26.1* 23.0*  PLT 210  --  257 256   BMET Recent Labs    11/28/23 0324 11/29/23 0659 11/30/23 0528  NA 138 134* 133*  K 3.6 3.8 3.9  CL 106 102 104  CO2 27 25 22   GLUCOSE 132* 98 128*  BUN 43* 29* 24*  CREATININE 1.19 0.97 0.97  CALCIUM 9.9 10.0 9.9   LFT Recent Labs    11/28/23 0323 11/30/23 0528  PROT 5.0* 4.7*  ALBUMIN 1.5* <1.5*  AST 89* 58*  ALT 58* 51*  ALKPHOS 75 72  BILITOT 0.6 0.4  BILIDIR 0.2  --   IBILI 0.4  --    PT/INR No results for input(s): "LABPROT", "INR" in the last 72 hours. Hepatitis Panel No results for input(s): "HEPBSAG", "HCVAB", "HEPAIGM", "HEPBIGM" in the last 72 hours.    Studies/Results: IR US Guide Bx Asp/Drain  Result Date: 11/28/2023 INDICATION: 72 year old male referred for biopsy. He has suspicion of lung carcinoma EXAM: ULTRASOUND-GUIDED BIOPSY OF LEFT LOWER QUADRANT ABDOMINAL WALL MASS.  MEDICATIONS: None. ANESTHESIA/SEDATION: None COMPLICATIONS: None PROCEDURE: Informed written consent was obtained from the patient after a thorough discussion of the procedural risks, benefits and alternatives. All questions were addressed. Maximal Sterile Barrier Technique was utilized including caps, mask, sterile gowns, sterile gloves, sterile drape, hand hygiene and skin antiseptic. A timeout was performed prior to the initiation of the procedure. The procedure, risks, benefits, and alternatives were explained to the patient. Questions regarding the procedure were encouraged and answered. The patient understands and consents to the procedure. Ultrasound survey was performed with images stored and sent to PACs. The left anterior abdominal wall was prepped with chlorhexidine in a sterile fashion, and a sterile drape was applied covering the operative field. A sterile gown and sterile gloves were used for the procedure. Local anesthesia was provided with 1% Lidocaine. Ultrasound guidance was used to infiltrate the region with 1% lidocaine for local anesthesia. Multiple 18 gauge core biopsy were then acquired of the soft tissue nodule using ultrasound guidance. Images were stored. Final image was stored after biopsy. Patient tolerated the procedure well and remained hemodynamically stable throughout. No complications were encountered and no significant blood loss was encounter IMPRESSION: Status post ultrasound-guided anterior abdominal wall mass biopsy Signed, Yvone Neu. Miachel Roux, RPVI [facility] Radiology Electronically Signed   By: Gilmer Mor D.O.   On: 11/28/2023 13:54    IMPRESSION/PLAN:  72 year old male with metastatic disease with unknown primary. Chest/abd/pelvic CT showed multiple subcutaneous metastases, bilateral adrenal masses, right retroperitoneal masses and multiple osseous metastases, the largest osseous metastasis involves the T10  vertebral body with intraspinal extension and possible thoracic cord compression. S/P US guided biopsy of LLQ abdominal wall mass per IR 12/4, path report consistent with adenocarcinoma. Endoscopic evaluation requested by oncology to rule out primary UGI malignancy in setting of anemia.  -EGD benefits and risks discussed with patient and sister/POA including risk with sedation, risk of bleeding, perforation and infection.  -NPO after midnight  -Hold Heparin infusion 4 hours prior to EGD   Normocytic anemia. On oral iron at home. Hg 8.4 -> 7.4.  -Transfuse for Hg < 7.5 or as needed if symptomatic.  -H/H -CBC in am  Elevated LFTs, trending downward. T. Bili 0.4. Alk phos 72. AST 58. ALT 51. CTAP 12/2 showed a small liver lesion, likely cyst without gallbladder wall thickening or biliary ductal dilatation.  -Hepatic panel in am  History of prostate cancer s/p prostatectomy and radiation therapy 2020   Sepsis, likely due to scrotal/penile cellulitis/candidiasis on IV Vanco and Cefepime. WBC 12.5. Pelvic MRI results pending. Evaluated by wound care team.   LLE DVT, chronic RLE  DVT.  Indeterminate DVT involving the left femoral vein, left popliteal vein, left posterior tibial veins  and left peroneal veins. Chronic DVT involving the right femoral vein. In setting of presumed metastatic cancer patient was started on Heparin infusion.  -Hold Heparin 4 hours prior to EGD   PVD s/p AKA 01/2023  Coronary artery and aortic atherosclerosis per CT   [facility]  11/30/2023, 3:55PM

## 2023-11-30 NOTE — Progress Notes (Signed)
PHARMACY - ANTICOAGULATION CONSULT NOTE  Pharmacy Consult for Heparin Indication: DVT  Allergies  Allergen Reactions   Shellfish Allergy Swelling    Patient Measurements: Height: 5\' 10"  (177.8 cm) Weight: 96.6 kg (212 lb 15.4 oz) IBW/kg (Calculated) : 73 Heparin Dosing Weight: 91.3 kg  Vital Signs: Temp: 98.2 F (36.8 C) (12/06 1930) Temp Source: Oral (12/06 1930) BP: 115/60 (12/06 1930) Pulse Rate: 113 (12/06 1930)  Labs: Recent Labs    11/28/23 0324 11/28/23 1432 11/29/23 0659 11/29/23 0827 11/30/23 0528 11/30/23 0744 11/30/23 1817  HGB 6.7*   < > 8.4*  --  7.4*  --  8.2*  HCT 21.3*   < > 26.1*  --  23.0*  --  25.5*  PLT 210  --  257  --  256  --   --   HEPARINUNFRC 0.54  --  0.35  --   --  0.21* 0.24*  CREATININE 1.19  --  0.97  --  0.97  --   --   CKTOTAL  --   --   --  529*  --   --   --    < > = values in this interval not displayed.    Estimated Creatinine Clearance: 79 mL/min (by C-G formula based on SCr of 0.97 mg/dL).  Assessment: Patient admitted following a fall and was found down after 48 hours. Vascular US showing age indeterminate DVT in left femoral vein, left popliteal vein, left posterior tibial veins, and left peroneal veins. Pharmacy consulted to dose heparin.    Heparin level remains subtherapeutic (0.24) after rate increase from 1550 to 1750 units/hr earlier today. PM RN found heparin running at shift change, though MAR displayed not running. Uncertain if any interruption. No bleeding noted. Hgb 7.4 this am, 8.2 tonight.  Received instructions from GI to hold heparin drip 4 hours prior to colonoscopy 12/7. RN also aware.   Goal of Therapy:  Heparin level 0.3-0.7 units/ml Monitor platelets by anticoagulation protocol: Yes   Plan:  Increase heparin drip to 1850 units/hr. Drip to be held at 0545 on 12/7 for colonoscopy at 0945. Will check heparin level and CBC in am, prior to holding drip if possible. Monitor for signs/symptoms of  bleeding. Follow up post-colonoscopy for further anticoagulation plans.  Dennie Fetters, RPh 11/30/2023,8:40 PM

## 2023-11-30 NOTE — Progress Notes (Signed)
[name]   DOB:December 11, 1951   XB#:[id number]   ZHY#:[id number]  Medical oncology follow-up note  Subjective: Patient was transferred from ICU to regular floor.  He was found to have acute right upper extremity DVT.  He is on heparin drip.  I met his sister and brother-in-law in his room today.   Objective:  Vitals:   11/30/23 1457 11/30/23 1803  BP: 121/64 126/63  Pulse: (!) 112 (!) 112  Resp: 16 16  Temp: 98.4 F (36.9 C) 99.5 F (37.5 C)  SpO2: 96%     Body mass index is 30.56 kg/m.  Intake/Output Summary (Last 24 hours) at 11/30/2023 1820 Last data filed at 11/30/2023 1716 Gross per 24 hour  Intake 1079.94 ml  Output 850 ml  Net 229.94 ml     Sclerae unicteric  MSK no focal spinal tenderness, no peripheral edema, s/p R AKA  Neuro nonfocal   CBG (last 3)  Recent Labs    11/27/23 1948 11/27/23 2345 11/28/23 0356  GLUCAP 120* 131* 120*     Labs:  Urine Studies No results for input(s): "UHGB", "CRYS" in the last 72 hours.  Invalid input(s): "UACOL", "UAPR", "USPG", "UPH", "UTP", "UGL", "UKET", "UBIL", "UNIT", "UROB", "ULEU", "UEPI", "UWBC", "URBC", "UBAC", "CAST", "UCOM", "BILUA"  Basic Metabolic Panel: Recent Labs  Lab 11/26/23 1033 11/27/23 0341 11/27/23 0859 11/28/23 0324 11/29/23 0659 11/30/23 0528  NA 139  --  140 138 134* 133*  K 4.0  --  3.7 3.6 3.8 3.9  CL 101  --  105 106 102 104  CO2 21*  --  26 27 25 22   GLUCOSE 165*  --  160* 132* 98 128*  BUN 97*  --  60* 43* 29* 24*  CREATININE 2.21*  --  1.38* 1.19 0.97 0.97  CALCIUM 12.4*  --  10.5* 9.9 10.0 9.9  MG  --  2.0  --   --   --   --   PHOS  --  3.2  --   --   --   --    GFR Estimated Creatinine Clearance: 79 mL/min (by C-G formula based on SCr of 0.97 mg/dL). Liver Function Tests: Recent Labs  Lab 11/26/23 1033 11/27/23 0859 11/28/23 0323 11/30/23 0528  AST 149* 119* 89* 58*  ALT 69* 65* 58* 51*  ALKPHOS 104 77 75 72  BILITOT 1.1 0.8 0.6 0.4  PROT 8.0 5.3* 5.0* 4.7*   ALBUMIN 2.6* 1.6* 1.5* <1.5*   No results for input(s): "LIPASE", "AMYLASE" in the last 168 hours. No results for input(s): "AMMONIA" in the last 168 hours. Coagulation profile No results for input(s): "INR", "PROTIME" in the last 168 hours.  CBC: Recent Labs  Lab 11/26/23 1033 11/27/23 0341 11/28/23 0324 11/28/23 1432 11/29/23 0659 11/30/23 0528  WBC 14.4* 11.7* 10.3  --  10.7* 12.5*  NEUTROABS 12.8*  --   --   --   --  9.0*  HGB 9.6* 7.1* 6.7* 8.5* 8.4* 7.4*  HCT 30.7* 22.4* 21.3* 27.7* 26.1* 23.0*  MCV 88.0 84.5 86.9  --  86.4 87.8  PLT 313 199 210  --  257 256   Cardiac Enzymes: Recent Labs  Lab 11/26/23 1033 11/27/23 0341 11/29/23 0827  CKTOTAL 6,948* 3,076* 529*   BNP: Invalid input(s): "POCBNP" CBG: Recent Labs  Lab 11/27/23 1111 11/27/23 1536 11/27/23 1948 11/27/23 2345 11/28/23 0356  GLUCAP 131* 124* 120* 131* 120*   D-Dimer No results for input(s): "DDIMER" in the last 72 hours. Hgb A1c  No results for input(s): "HGBA1C" in the last 72 hours. Lipid Profile No results for input(s): "CHOL", "HDL", "LDLCALC", "TRIG", "CHOLHDL", "LDLDIRECT" in the last 72 hours. Thyroid function studies No results for input(s): "TSH", "T4TOTAL", "T3FREE", "THYROIDAB" in the last 72 hours.  Invalid input(s): "FREET3" Anemia work up No results for input(s): "VITAMINB12", "FOLATE", "FERRITIN", "TIBC", "IRON", "RETICCTPCT" in the last 72 hours. Microbiology Recent Results (from the past 240 hour(s))  Blood culture (routine x 2)     Status: None (Preliminary result)   Collection Time: 11/26/23 12:13 PM   Specimen: BLOOD  Result Value Ref Range Status   Specimen Description BLOOD BLOOD RIGHT ARM  Final   Special Requests   Final    BOTTLES DRAWN AEROBIC AND ANAEROBIC Blood Culture results may not be optimal due to an inadequate volume of blood received in culture bottles   Culture   Final    NO GROWTH 4 DAYS Performed at [facility], [address] [place],  [place] [address]    Report Status PENDING  Incomplete  Blood culture (routine x 2)     Status: None (Preliminary result)   Collection Time: 11/26/23 12:20 PM   Specimen: BLOOD  Result Value Ref Range Status   Specimen Description BLOOD BLOOD RIGHT HAND AEROBIC BOTTLE ONLY  Final   Special Requests   Final    Blood Culture results may not be optimal due to an inadequate volume of blood received in culture bottles BOTTLES DRAWN AEROBIC ONLY   Culture   Final    NO GROWTH 4 DAYS Performed at [facility], [address] [place], [place] [address]    Report Status PENDING  Incomplete  MRSA Next Gen by PCR, Nasal     Status: None   Collection Time: 11/26/23  9:02 PM   Specimen: Nasal Mucosa; Nasal Swab  Result Value Ref Range Status   MRSA by PCR Next Gen NOT DETECTED NOT DETECTED Final    Comment: (NOTE) The GeneXpert MRSA Assay (FDA approved for NASAL specimens only), is one component of a comprehensive MRSA colonization surveillance program. It is not intended to diagnose MRSA infection nor to guide or monitor treatment for MRSA infections. Test performance is not FDA approved in patients less than 63 years old. Performed at [facility], [address], [place], [place] [address]       Studies:  MR PELVIS W WO CONTRAST  Result Date: 11/30/2023 CLINICAL DATA:  Soft tissue infection suspected evaluate for abscess and gas formation underneath the skin around the groin, penile, and scrotal area, clinical concern for cellulitis and candidiasis. Metastatic malignancy with soft tissue metastases in the pelvis. EXAM: MRI PELVIS WITHOUT AND WITH CONTRAST TECHNIQUE: Multiplanar multisequence MR imaging of the pelvis was performed both before and after administration of intravenous contrast. CONTRAST:  9mL GADAVIST GADOBUTROL 1 MMOL/ML IV SOLN COMPARISON:  CT abdomen pelvis, 11/26/2023 FINDINGS: Urinary Tract:  No abnormality visualized. Bowel:  Unremarkable visualized pelvic bowel loops.  Vascular/Lymphatic: Multiple enlarged bilateral inguinal lymph nodes left inguinal nodes measuring up to 2.0 x 1.3 cm (series 13, image 15). No significant vascular abnormality seen. Reproductive: Status post prostatectomy. Soft tissue edema of the scrotum and penis. No focal fluid collection. Other: Diffuse anasarca. Multiple enhancing subcutaneous soft tissue nodules, including within the pubic subcutaneous fat measuring 3.3 x 2.8 cm (series 13, image 18), in the subcutaneous fat of the anterior left thigh measuring 2.1 x 1.9 cm (series 13, image 8), overlying the left hamstring  musculature measuring 2.6 x 2.0 cm (series 13, image 56), and in the right biceps femoris muscle body measuring 2.6 x 2.5 cm (series 13, image 66). Musculoskeletal: Multiple small enhancing osseous metastases, including of the greater trochanter of the left femur (series 13, image 6), and of the left ischial ramus (series 13, image 21). IMPRESSION: 1. Soft tissue edema of the scrotum and penis, consistent with cellulitis. No focal fluid collection or gas to suggest abscess or necrotizing fasciitis. 2. Multiple enhancing subcutaneous and intramuscular metastatic soft tissue nodules throughout the pelvis and included proximal femurs. 3. Multiple enlarged bilateral inguinal lymph nodes, possibly reactive although highly suspicious for nodal metastases. 4. Multiple small enhancing osseous metastases. 5. Status post prostatectomy. Electronically Signed   By: Jearld Lesch M.D.   On: 11/30/2023 15:32   VAS [place] UPPER EXTREMITY VENOUS DUPLEX  Result Date: 11/30/2023 UPPER VENOUS STUDY  Patient Name:  AUDRICK GENCO  Date of Exam:   11/30/2023 Medical Rec #: [id number]            Accession #:    [id number] Date of Birth: 1951-11-20            Patient Gender: M Patient Age:   72 years Exam Location:  [facility] Procedure:      VAS [place] UPPER EXTREMITY VENOUS DUPLEX Referring Phys: RALPH NETTEY  --------------------------------------------------------------------------------  Indications: Swelling Other Indications: Testing upon admission revealed multiple enlarged cervical lymph nodes predominantly on the right side. Adenopathy also envolving right axillary and supraclavicular areas. Soft tissue lesions seen on CT and MRI. Anticoagulation: Heparin. Limitations: Poor ultrasound/tissue interface and line. Comparison Study: No previous UEV exams Performing Technologist: Jody Hill RVT, RDMS  Examination Guidelines: A complete evaluation includes B-mode imaging, spectral Doppler, color Doppler, and power Doppler as needed of all accessible portions of each vessel. Bilateral testing is considered an integral part of a complete examination. Limited examinations for reoccurring indications may be performed as noted.  Right Findings: +----------+------------+---------+-----------+----------+---------------------+ RIGHT     CompressiblePhasicitySpontaneousProperties       Summary        +----------+------------+---------+-----------+----------+---------------------+ IJV           Full       Yes       Yes                                  +----------+------------+---------+-----------+----------+---------------------+ Subclavian    None       No        No                Acute - mid and                                                               distal         +----------+------------+---------+-----------+----------+---------------------+ Axillary      None       No        No                       Acute         +----------+------------+---------+-----------+----------+---------------------+ Brachial    Partial  Acute proximal - one                                                            of paired       +----------+------------+---------+-----------+----------+---------------------+ Radial        Full                                                         +----------+------------+---------+-----------+----------+---------------------+ Ulnar         Full                                                        +----------+------------+---------+-----------+----------+---------------------+ Cephalic      None       No        No                       Acute         +----------+------------+---------+-----------+----------+---------------------+ Basilic       None       No        No                       Acute         +----------+------------+---------+-----------+----------+---------------------+   Extrinsic compression of jugular and proximal subclavian veins from several solid appearing masses of neck and clavicle area. Cannot differentiate thyroid from myph nodes / masses. Subcutaneous fluid of right neck.  Left Findings: +----------+------------+---------+-----------+----------+------------------+ LEFT      CompressiblePhasicitySpontaneousProperties     Summary       +----------+------------+---------+-----------+----------+------------------+ IJV           Full       Yes       Yes                                 +----------+------------+---------+-----------+----------+------------------+ Subclavian    Full       Yes       Yes                                 +----------+------------+---------+-----------+----------+------------------+ Axillary      Full       Yes       Yes                                 +----------+------------+---------+-----------+----------+------------------+ Brachial      Full       Yes       Yes                                 +----------+------------+---------+-----------+----------+------------------+ Radial        Full                                                     +----------+------------+---------+-----------+----------+------------------+  Ulnar                                               unable to insonate  +----------+------------+---------+-----------+----------+------------------+ Cephalic      None       No        No                     Acute        +----------+------------+---------+-----------+----------+------------------+ Basilic       Full       Yes       Yes                                 +----------+------------+---------+-----------+----------+------------------+ hypoechoic heterogenous superficial solid appearing structure seen proximally to axillary region (3.0 x 1.6 x 2.5 cm) & in mid upper arm (1.9 x 1.1 x 1.5 cm). hypoechoic solid appearing intramuscular structure seen in mid upper arm (2.0 x 0.9 x 1.1 cm).  Summary:  Right: Findings consistent with acute deep vein thrombosis involving the right subclavian vein, right axillary vein and right brachial veins. Findings consistent with acute superficial vein thrombosis involving the right basilic vein and right cephalic vein.  Left: Findings consistent with acute superficial vein thrombosis involving the left cephalic vein. However, unable to visualize the ulnar veins.  *See table(s) above for measurements and observations.  Diagnosing physician: Carolynn Sayers Electronically signed by Carolynn Sayers on 11/30/2023 at 2:15:21 PM.    Final     Assessment: 72 y.o. male   Newly diagnosed metastatic adenocarcinoma, primary uncertain, possible upper GI Hypovolemic shock, resolved  AKI, improved PAD, status post right AKA Age-indeterminate left lower extremity DVT, acute R UE DVT    Plan:  -I reviewed today's biopsy results, which showed metastatic adenocarcinoma, the IHC studies does not support lung primary, upper GI is favored.  -Doing the significant anemia, and the biopsy results, I recommend EGD as soon as possible to see if he has upper GI malignancy. -Reviewed the incurable nature of his metastatic disease, treatment option would depend on the the primary malignancy.  I reviewed the overall poor prognosis with patient and his  sister. -Agree with anticoagulation with heparin for his DVTs, when he is ready to be discharged from hospital, okay to switch to DOAC -I will f/u early next week.    Malachy Mood, MD 11/30/2023  6:20 PM

## 2023-11-30 NOTE — TOC Progression Note (Addendum)
Transition of Care [facility]) - Progression Note    Patient Details  Name: [name] MRN: [id number] Date of Birth: Jan 06, 1951  [facility] of [facility] [place] CM/SW Contact  [name], [place] Phone Number: 11/30/2023, 12:13 PM  Clinical Narrative:     Update 1239 CSW spoke with pt's sister, Dewayne Hatch about bed offers and she stated she would discuss with pt and decide. CSW reached out to [facility] rehab about possibly extending bed offers. Waiting to hear back. CSW will follow up when able. Bed offer decision pending for SNF.   CSW followed up with pt to present bed offers with medicare.gov ratings. Pt currently off the floor and CSW unable to provide bed offers. CSW to follow up at another time to provide current offers.    TOC will continue to follow.   Expected Discharge Plan: Skilled Nursing Facility Barriers to Discharge: Continued Medical Work up, English as a second language teacher  Expected Discharge Plan and Services       Living arrangements for the past 2 months: Single Family Home                                       Social Determinants of Health (SDOH) Interventions SDOH Screenings   Food Insecurity: Patient Unable To Answer (11/26/2023)  Recent Concern: Food Insecurity - Food Insecurity Present (10/09/2023)  Housing: Patient Unable To Answer (10/09/2023)  Transportation Needs: No Transportation Needs (10/09/2023)  Utilities: Patient Unable To Answer (11/26/2023)  Depression (PHQ2-9): Low Risk  (10/10/2023)  Financial Resource Strain: Medium Risk (10/09/2023)  Physical Activity: Unknown (10/09/2023)  Social Connections: Moderately Integrated (10/09/2023)  Stress: No Stress Concern Present (10/09/2023)  Tobacco Use: Medium Risk (11/26/2023)    Readmission Risk Interventions     No data to display

## 2023-11-30 NOTE — Progress Notes (Signed)
Pharmacy Antibiotic Note  [name] is a 72 y.o. male admitted on 11/26/2023 with AMS with concerns for septic shock w/ rhabdomyolysis, now with penile/scrotal cellulitis.  Pharmacy has been consulted for cefepime and vancomycin dosing.  -WBC 14.4 >10.7, sCr 0.97 (improving with fluids), Tmax 101.3, tachycardic, hypotensive -Last dose fo antibiotics on 12/4 (Zosyn 3.375g IV) and received 1x dose of vancomycin on 12/2 -12/2 blood cultures NGTD x3 days  Plan: -Cefepime 2g IV every 8h -Vancomycin 2g IV x1  -Vancomycin 750mg  IV every 12 hours (AUC 500, Vd 0.5, IBW) -Monitor renal function -Follow up signs of clinical improvement, LOT, de-escalation of antibiotics  -Follow Up urology consult for ruling out abscess formation   Height: 5\' 10"  (177.8 cm) Weight: 95.9 kg (211 lb 6.7 oz) IBW/kg (Calculated) : 73  Temp (24hrs), Avg:99 F (37.2 C), Min:97.6 F (36.4 C), Max:101.3 F (38.5 C)  Recent Labs  Lab 11/26/23 1033 11/26/23 1220 11/26/23 1406 11/27/23 0341 11/27/23 0859 11/28/23 0324 11/29/23 0659  WBC 14.4*  --   --  11.7*  --  10.3 10.7*  CREATININE 2.21*  --   --   --  1.38* 1.19 0.97  LATICACIDVEN  --  2.0* 1.6  --   --   --   --     Estimated Creatinine Clearance: 78.9 mL/min (by C-G formula based on SCr of 0.97 mg/dL).    Allergies  Allergen Reactions   Shellfish Allergy Swelling    Antimicrobials this admission: Zosyn 12/2 >> 12/4 Cefepime 12/6 >>  Vancomycin 12/6 >>  Microbiology results: 12/2 BCx: NGTD, 12/6 Bcx: ordered  Thank you for allowing pharmacy to be a part of this patient's care.  Arabella Merles, PharmD. Clinical Pharmacist 11/30/2023 2:24 AM

## 2023-11-30 NOTE — Progress Notes (Signed)
PHARMACY - ANTICOAGULATION CONSULT NOTE  Pharmacy Consult for heparin  Indication: DVT  Allergies  Allergen Reactions   Shellfish Allergy Swelling    Patient Measurements: Height: 5\' 10"  (177.8 cm) Weight: 96.6 kg (212 lb 15.4 oz) IBW/kg (Calculated) : 73 Heparin Dosing Weight: 91.3kg   Vital Signs: Temp: 100 F (37.8 C) (12/06 0421) Temp Source: Axillary (12/06 0421) BP: 106/48 (12/06 0421) Pulse Rate: 84 (12/06 0421)  Labs: Recent Labs    11/28/23 0324 11/28/23 1432 11/29/23 0659 11/29/23 0827 11/30/23 0528 11/30/23 0744  HGB 6.7* 8.5* 8.4*  --  7.4*  --   HCT 21.3* 27.7* 26.1*  --  23.0*  --   PLT 210  --  257  --  256  --   HEPARINUNFRC 0.54  --  0.35  --   --  0.21*  CREATININE 1.19  --  0.97  --  0.97  --   CKTOTAL  --   --   --  529*  --   --     Estimated Creatinine Clearance: 79 mL/min (by C-G formula based on SCr of 0.97 mg/dL).   Medical History: Past Medical History:  Diagnosis Date   Family history of metabolic acidosis with increased anion gap 03/05/2023   GERD (gastroesophageal reflux disease)    History of kidney stones    Hx of AKA (above knee amputation), right (HCC)    Hyperlipidemia    Hypertension    Metastatic disease (HCC)    PAD (peripheral artery disease) (HCC)    S/p R CFA and PTA bypass 11/2022, critical limb ischemia s/p R AKA 02/2023   Prostate cancer [facility]) 2011   radiation june 2020   Septic shock (HCC) 11/26/2023   Umbilical hernia    Wears dentures    full   Wears glasses    Wears partial dentures    lower    Assessment: Patient admitted following a fall and was found down after 48 hours. Vascular US showing age indeterminate DVT in left femoral vein, left popliteal vein, left posterior tibial veins, and left peroneal veins. Pharmacy consulted to dose heparin.   Heparin level subtherapeutic at 0.21 despite increase to 1550 units/hr yesterday. Hgb is down slightly to 7.4, PLT stable at 256. No issues with bleeding or  line per RN.   Goal of Therapy:  Heparin level 0.3-0.7 units/ml Monitor platelets by anticoagulation protocol: Yes   Plan:  Increase heparin to 1750 units/hr  Obtain heparin level in 8 hours Daily heparin level and CBC Continue to monitor for s/sx of bleeding Pharmacy will continue to follow   Lennie Muckle, PharmD PGY1 Pharmacy Resident 11/30/2023 8:52 AM

## 2023-11-30 NOTE — Progress Notes (Addendum)
  Scrotal, penile cellulitis and candidiasis: Patient's nurse reported that patient is having high temperature 100.1 F, tachycardic 103 and hypotensive 107/58.  Per chart review patient has been admitted for septic shock required pressor support and unknown source of infection.  Also had traumatic rhabdomyolysis on admission treated with IV fluid. -Given his patient is spiking temperature and physical exam showed scrotal, penile and groin cellulitis versus candidiasis.  No evidence of crepitus. - Obtaining MRI pelvis specifically looking for abscess formation of the groin, penis and scrotum. -Starting broad-spectrum antibiotic IV vancomycin and cefepime. -Continue nystatin powder bilateral groin area. -Giving LR 500 cc bolus and continue LR 100 cc/h. -Obtaining CBC, CMP and blood cultures. -Continue broad-spectrum coverage with IV vancomycin and cefepime.     Media Information   Document Information  Photos    11/26/2023 18:48  Attached To:  Hospital Encounter on 11/26/23  Source Information  Serena Colonel, RN  [facility]  Document History    Media Information   Document Information  Photos    11/26/2023 18:48  Attached To:  Hospital Encounter on 11/26/23  Source Information  Serena Colonel, RN  [facility]  Document History

## 2023-11-30 NOTE — Plan of Care (Signed)
  Problem: Education: Goal: Knowledge of General Education information will improve Description: Including pain rating scale, medication(s)/side effects and non-pharmacologic comfort measures Outcome: Progressing   Problem: Health Behavior/Discharge Planning: Goal: Ability to manage health-related needs will improve Outcome: Progressing   Problem: Clinical Measurements: Goal: Ability to maintain clinical measurements within normal limits will improve Outcome: Progressing Goal: Will remain free from infection Outcome: Progressing Goal: Diagnostic test results will improve Outcome: Progressing Goal: Respiratory complications will improve Outcome: Progressing Goal: Cardiovascular complication will be avoided Outcome: Progressing   Problem: Activity: Goal: Risk for activity intolerance will decrease Outcome: Progressing   Problem: Nutrition: Goal: Adequate nutrition will be maintained Outcome: Progressing   Problem: Coping: Goal: Level of anxiety will decrease Outcome: Progressing   Problem: Elimination: Goal: Will not experience complications related to bowel motility Outcome: Progressing Goal: Will not experience complications related to urinary retention Outcome: Progressing   Problem: Pain Management: Goal: General experience of comfort will improve Outcome: Progressing   Problem: Safety: Goal: Ability to remain free from injury will improve Outcome: Progressing   Problem: Skin Integrity: Goal: Risk for impaired skin integrity will decrease Outcome: Progressing   Problem: Fluid Volume: Goal: Hemodynamic stability will improve Outcome: Progressing   Problem: Clinical Measurements: Goal: Diagnostic test results will improve Outcome: Progressing Goal: Signs and symptoms of infection will decrease Outcome: Progressing   Problem: Respiratory: Goal: Ability to maintain adequate ventilation will improve Outcome: Progressing   Problem: Education: Goal:  Knowledge of General Education information will improve Description: Including pain rating scale, medication(s)/side effects and non-pharmacologic comfort measures Outcome: Progressing   Problem: Health Behavior/Discharge Planning: Goal: Ability to manage health-related needs will improve Outcome: Progressing   Problem: Clinical Measurements: Goal: Ability to maintain clinical measurements within normal limits will improve Outcome: Progressing Goal: Will remain free from infection Outcome: Progressing Goal: Diagnostic test results will improve Outcome: Progressing Goal: Respiratory complications will improve Outcome: Progressing Goal: Cardiovascular complication will be avoided Outcome: Progressing   Problem: Activity: Goal: Risk for activity intolerance will decrease Outcome: Progressing   Problem: Nutrition: Goal: Adequate nutrition will be maintained Outcome: Progressing   Problem: Coping: Goal: Level of anxiety will decrease Outcome: Progressing   Problem: Elimination: Goal: Will not experience complications related to bowel motility Outcome: Progressing Goal: Will not experience complications related to urinary retention Outcome: Progressing   Problem: Pain Management: Goal: General experience of comfort will improve Outcome: Progressing   Problem: Safety: Goal: Ability to remain free from injury will improve Outcome: Progressing   Problem: Skin Integrity: Goal: Risk for impaired skin integrity will decrease Outcome: Progressing

## 2023-11-30 NOTE — Progress Notes (Signed)
PROGRESS NOTE    [name]  WUJ:[id number] DOB: September 28, 1951 DOA: 11/26/2023 PCP: [place] Records, FNP   Brief Narrative: [name] is a 72 y.o. male with a history of hypertension, hyperlipidemia, PAD status post right AKA, CKD stage IIIa, GERD, prostate cancer status post prostatectomy.  Patient presented secondary to being found down on the ground and altered and was found to have evidence of hypotension concerning for septic shock with additional evidence of rhabdomyolysis.  Patient started empirically on antibiotics, started on vasopressor support, and IV fluids and was transferred to [facility] from [facility] for ICU admission.  Antibiotics were discontinued secondary to unlikely infectious etiology.  Hypotension and rhabdomyolysis improved with IV fluids and vasopressor support was discontinued.  Admission complicated by the identification of metastatic disease of unknown primary   Assessment and Plan:  Hypovolemic shock Sepsis ruled out. Patient managed in ICU with norepinephrine and fluid resuscitation.   Traumatic rhabdomyolysis Secondary to being down. CK of 6,948 on admission. Associated elevated AST/ALT. Improvement with IV fluids.  Acute anemia Anemia of chronic disease Baseline hemoglobin of 8-9 d/dL. During this admission, hemoglobin drifted down to a low of 6.7 g/dL requiring a transfusion of 1 unit of PRBC on 12/4. Post-transfusion hemoglobin of 8.5 g/dL. Hemoglobin appears stable.  AKI Baseline creatinine appears to be around 1.3; unclear if patient has CKD stage IIIa vs CKD stage II. Creatinine of 2.21 on admission with improvement to 0.97 prior to discharge.  GERD Continue Protonix.  Metastatic disease Unknown primary. Metastatic disease noted of right  adrenal gland in addition to bony metastasis noted at T5, T6 and T10 with associated pathologic fractures, in addition to the anterior right fifth rib. Medical oncology  consulted. Enlarged right cervical lymph node noted in addition to hilar mass/adenopathy with mediastinal, axillary and right supraclavicular adenopathy on CT. Bedside US guided biopsy of left anterior abdominal wall mass performed on 12/4 which is positive for adenocarcinoma. -Oncology recommendations: GI consult for EGD to assess for possible underlying upper GI tract malignancy  Central canal narrowing Noted on CT imaging at T10 secondary to extension of metastatic disease. Severe spinal stenosis at T1 and mild stenosis at T10 noted on MRI . No cord compression noted on MRI.  Primary hypertension Antihypertensives held secondary to hypovolemic shock.  Hyperlipidemia Lipitor held.  PAD History of right AKA. -Continue aspirin.  Age indeterminate left lower extremity DVT Chronic right lower extremity DVT Indeterminate DVT involving the left femoral vein, left popliteal vein, left posterior tibial veins and left peroneal veins. Chronic DVT involving the right femoral vein. In setting of presumed metastatic cancer. Patient started on heparin IV. -Continue heparin IV  Acute right upper extremity DVT Acute right upper extremity SVT Acute DVT involving the right subclavian vein, right axillary vein and right brachial veins. Acute SVT involving the right basilic vein and right cephalic vein  Scrotal cellulitis/candidiasis MRI without deep infection, abscess, or features necrotizing disease -Continue Vancomycin, Cefepime, Nystatin -Follow-up culture data  Sepsis Unclear is present on admission, but recurrent criteria met during hospitalization secondary to scrotal infection. Blood cultures obtained. Empiric vancomycin and Cefepime started for treatment. -Follow-up blood cultures  Phantom limb pain Noted.  Moderate malnutrition Dietitian recommendations (12/5): Ensure Plus High Protein po BID, each supplement provides 350 kcal and 20 grams of protein. Multivitamin with minerals    Pressure injury Distal left/medial pretibial area. Present on admission.   DVT prophylaxis: Heparin IV Code Status:  Code Status: Full Code Family Communication: None at bedside. Called sister with no response Disposition Plan: Discharge to SNF pending ongoing workup for metastatic cancer/oncology recommendations   Consultants:  PCCM Medical oncology Interventional radiology  Procedures:  12/4: Bedside US guided biopsy of left ant abd wall mass   Antimicrobials: Zosyn Vancomycin Cefepime    Subjective: No concerns this morning. Feels well. Eating breakfast.  Objective: BP 121/64 (BP Location: Left Arm)   Pulse (!) 112   Temp 98.4 F (36.9 C) (Oral)   Resp 16   Ht 5\' 10"  (1.778 m)   Wt 96.6 kg   SpO2 96%   BMI 30.56 kg/m   Examination:  General exam: Appears calm and comfortable Respiratory system: Clear to auscultation. Respiratory effort normal. Cardiovascular system: S1 & S2 heard, RRR. No murmurs, rubs, gallops or clicks. Gastrointestinal system: Abdomen is nondistended, soft and nontender. Normal bowel sounds heard. Central nervous system: Alert and oriented. No focal neurological deficits. Musculoskeletal: RUE and LLE edema. No calf tenderness Psychiatry: Judgement and insight appear normal. Mood & affect appropriate.    Data Reviewed: I have personally reviewed following labs and imaging studies  CBC Lab Results  Component Value Date   WBC 12.5 (H) 11/30/2023   RBC 2.62 (L) 11/30/2023   HGB 7.4 (L) 11/30/2023   HCT 23.0 (L) 11/30/2023   MCV 87.8 11/30/2023   MCH 28.2 11/30/2023   PLT 256 11/30/2023   MCHC 32.2 11/30/2023   RDW 17.6 (H) 11/30/2023   LYMPHSABS 2.2 11/30/2023   MONOABS 0.9 11/30/2023   EOSABS 0.1 11/30/2023   BASOSABS 0.0 11/30/2023     Last metabolic panel Lab Results  Component Value Date   NA 133 (L) 11/30/2023   K 3.9 11/30/2023   CL 104 11/30/2023   CO2 22 11/30/2023   BUN 24 (H) 11/30/2023   CREATININE 0.97  11/30/2023   GLUCOSE 128 (H) 11/30/2023   GFRNONAA >60 11/30/2023   GFRAA (L) 03/18/2010    60        The eGFR has been calculated using the MDRD equation. This calculation has not been validated in all clinical situations. eGFR's persistently <60 mL/min signify possible Chronic Kidney Disease.   CALCIUM 9.9 11/30/2023   PHOS 3.2 11/27/2023   PROT 4.7 (L) 11/30/2023   ALBUMIN <1.5 (L) 11/30/2023   LABGLOB 4.2 (H) 10/23/2023   AGRATIO 0.6 (L) 10/23/2023   BILITOT 0.4 11/30/2023   ALKPHOS 72 11/30/2023   AST 58 (H) 11/30/2023   ALT 51 (H) 11/30/2023   ANIONGAP 7 11/30/2023    GFR: Estimated Creatinine Clearance: 79 mL/min (by C-G formula based on SCr of 0.97 mg/dL).  Recent Results (from the past 240 hour(s))  Blood culture (routine x 2)     Status: None (Preliminary result)   Collection Time: 11/26/23 12:13 PM   Specimen: BLOOD  Result Value Ref Range Status   Specimen Description BLOOD BLOOD RIGHT ARM  Final   Special Requests   Final    BOTTLES DRAWN AEROBIC AND ANAEROBIC Blood Culture results may not be optimal due to an inadequate volume of blood received in culture bottles   Culture   Final    NO GROWTH 4 DAYS Performed at [facility], [address] [place], [place] [address]    Report Status PENDING  Incomplete  Blood culture (routine x 2)     Status: None (Preliminary result)   Collection Time: 11/26/23 12:20 PM   Specimen: BLOOD  Result Value Ref Range  Status   Specimen Description BLOOD BLOOD RIGHT HAND AEROBIC BOTTLE ONLY  Final   Special Requests   Final    Blood Culture results may not be optimal due to an inadequate volume of blood received in culture bottles BOTTLES DRAWN AEROBIC ONLY   Culture   Final    NO GROWTH 4 DAYS Performed at [facility], [address], [place], [place] [address]    Report Status PENDING  Incomplete  MRSA Next Gen by PCR, Nasal     Status: None   Collection Time: 11/26/23  9:02 PM   Specimen: Nasal Mucosa; Nasal Swab   Result Value Ref Range Status   MRSA by PCR Next Gen NOT DETECTED NOT DETECTED Final    Comment: (NOTE) The GeneXpert MRSA Assay (FDA approved for NASAL specimens only), is one component of a comprehensive MRSA colonization surveillance program. It is not intended to diagnose MRSA infection nor to guide or monitor treatment for MRSA infections. Test performance is not FDA approved in patients less than 73 years old. Performed at [facility], [address] [place], [place] [address]       Radiology Studies: MR PELVIS W WO CONTRAST  Result Date: 11/30/2023 CLINICAL DATA:  Soft tissue infection suspected evaluate for abscess and gas formation underneath the skin around the groin, penile, and scrotal area, clinical concern for cellulitis and candidiasis. Metastatic malignancy with soft tissue metastases in the pelvis. EXAM: MRI PELVIS WITHOUT AND WITH CONTRAST TECHNIQUE: Multiplanar multisequence MR imaging of the pelvis was performed both before and after administration of intravenous contrast. CONTRAST:  9mL GADAVIST GADOBUTROL 1 MMOL/ML IV SOLN COMPARISON:  CT abdomen pelvis, 11/26/2023 FINDINGS: Urinary Tract:  No abnormality visualized. Bowel:  Unremarkable visualized pelvic bowel loops. Vascular/Lymphatic: Multiple enlarged bilateral inguinal lymph nodes left inguinal nodes measuring up to 2.0 x 1.3 cm (series 13, image 15). No significant vascular abnormality seen. Reproductive: Status post prostatectomy. Soft tissue edema of the scrotum and penis. No focal fluid collection. Other: Diffuse anasarca. Multiple enhancing subcutaneous soft tissue nodules, including within the pubic subcutaneous fat measuring 3.3 x 2.8 cm (series 13, image 18), in the subcutaneous fat of the anterior left thigh measuring 2.1 x 1.9 cm (series 13, image 8), overlying the left hamstring musculature measuring 2.6 x 2.0 cm (series 13, image 56), and in the right biceps femoris muscle body measuring 2.6 x 2.5 cm  (series 13, image 66). Musculoskeletal: Multiple small enhancing osseous metastases, including of the greater trochanter of the left femur (series 13, image 6), and of the left ischial ramus (series 13, image 21). IMPRESSION: 1. Soft tissue edema of the scrotum and penis, consistent with cellulitis. No focal fluid collection or gas to suggest abscess or necrotizing fasciitis. 2. Multiple enhancing subcutaneous and intramuscular metastatic soft tissue nodules throughout the pelvis and included proximal femurs. 3. Multiple enlarged bilateral inguinal lymph nodes, possibly reactive although highly suspicious for nodal metastases. 4. Multiple small enhancing osseous metastases. 5. Status post prostatectomy. Electronically Signed   By: Jearld Lesch M.D.   On: 11/30/2023 15:32   VAS [place] UPPER EXTREMITY VENOUS DUPLEX  Result Date: 11/30/2023 UPPER VENOUS STUDY  Patient Name:  [name]  Date of Exam:   11/30/2023 Medical Rec #: [id number]            Accession #:    [id number] Date of Birth: [birth year]            Patient Gender: M Patient Age:  72 years Exam Location:  [facility] Procedure:      VAS [place] UPPER EXTREMITY VENOUS DUPLEX Referring Phys: Clester Chlebowski --------------------------------------------------------------------------------  Indications: Swelling Other Indications: Testing upon admission revealed multiple enlarged cervical lymph nodes predominantly on the right side. Adenopathy also envolving right axillary and supraclavicular areas. Soft tissue lesions seen on CT and MRI. Anticoagulation: Heparin. Limitations: Poor ultrasound/tissue interface and line. Comparison Study: No previous UEV exams Performing Technologist: Jody Hill RVT, RDMS  Examination Guidelines: A complete evaluation includes B-mode imaging, spectral Doppler, color Doppler, and power Doppler as needed of all accessible portions of each vessel. Bilateral testing is considered an integral part of a complete examination.  Limited examinations for reoccurring indications may be performed as noted.  Right Findings: +----------+------------+---------+-----------+----------+---------------------+ RIGHT     CompressiblePhasicitySpontaneousProperties       Summary        +----------+------------+---------+-----------+----------+---------------------+ IJV           Full       Yes       Yes                                  +----------+------------+---------+-----------+----------+---------------------+ Subclavian    None       No        No                Acute - mid and                                                               distal         +----------+------------+---------+-----------+----------+---------------------+ Axillary      None       No        No                       Acute         +----------+------------+---------+-----------+----------+---------------------+ Brachial    Partial                                 Acute proximal - one                                                            of paired       +----------+------------+---------+-----------+----------+---------------------+ Radial        Full                                                        +----------+------------+---------+-----------+----------+---------------------+ Ulnar         Full                                                        +----------+------------+---------+-----------+----------+---------------------+  Cephalic      None       No        No                       Acute         +----------+------------+---------+-----------+----------+---------------------+ Basilic       None       No        No                       Acute         +----------+------------+---------+-----------+----------+---------------------+   Extrinsic compression of jugular and proximal subclavian veins from several solid appearing masses of neck and clavicle area. Cannot differentiate thyroid  from myph nodes / masses. Subcutaneous fluid of right neck.  Left Findings: +----------+------------+---------+-----------+----------+------------------+ LEFT      CompressiblePhasicitySpontaneousProperties     Summary       +----------+------------+---------+-----------+----------+------------------+ IJV           Full       Yes       Yes                                 +----------+------------+---------+-----------+----------+------------------+ Subclavian    Full       Yes       Yes                                 +----------+------------+---------+-----------+----------+------------------+ Axillary      Full       Yes       Yes                                 +----------+------------+---------+-----------+----------+------------------+ Brachial      Full       Yes       Yes                                 +----------+------------+---------+-----------+----------+------------------+ Radial        Full                                                     +----------+------------+---------+-----------+----------+------------------+ Ulnar                                               unable to insonate +----------+------------+---------+-----------+----------+------------------+ Cephalic      None       No        No                     Acute        +----------+------------+---------+-----------+----------+------------------+ Basilic       Full       Yes       Yes                                 +----------+------------+---------+-----------+----------+------------------+  hypoechoic heterogenous superficial solid appearing structure seen proximally to axillary region (3.0 x 1.6 x 2.5 cm) & in mid upper arm (1.9 x 1.1 x 1.5 cm). hypoechoic solid appearing intramuscular structure seen in mid upper arm (2.0 x 0.9 x 1.1 cm).  Summary:  Right: Findings consistent with acute deep vein thrombosis involving the right subclavian vein, right axillary vein and right  brachial veins. Findings consistent with acute superficial vein thrombosis involving the right basilic vein and right cephalic vein.  Left: Findings consistent with acute superficial vein thrombosis involving the left cephalic vein. However, unable to visualize the ulnar veins.  *See table(s) above for measurements and observations.  Diagnosing physician: Carolynn Sayers Electronically signed by Carolynn Sayers on 11/30/2023 at 2:15:21 PM.    Final       LOS: 4 days    Jacquelin Hawking, MD [facility] 11/30/2023, 4:38 PM   If 7PM-7AM, please contact night-coverage [email or web address]

## 2023-11-30 NOTE — H&P (View-Only) (Signed)
Referring Provider: Dr. Malachy Mood Primary Care Physician:  Rica Records, FNP Primary Gastroenterologist:  Dr. Jena Gauss   Reason for Consultation:  Oncology requesting EGD to rule out UGI malignancy, anemia   HPI: [name] is a 72 y.o. male with a past medical history of hypertension, hyperlipidemia, peripheral arterial disease s/p R CFA and PTA bypass 11/2022 and critical limb ischemia s/p R AKA 02/2023, kidney stones, anemia, prostate cancer s/p radiation 2020 with bone metastasis, chronic RLE DVT, GERD and tubular adenomatous colon polyps.   Patient presented to the ED 11/26/2023 with altered mental status with recent fall at home. He presented with a wound over the scrotum/perineum. Labs in the ED showed a WBC count of 14.4. Hemoglobin 9.6.  BUN 97.  Creatinine 2.21.  Albumin 2.6.  AST 149.  ALT 69.  CK 6948.). He was hypotensive, received aggressive IV fluid resuscitation for rhabdomyolysis. He met criteria for SIRS and was placed on antibiotics. CT head/face and cervical spine were unremarkable for acute traumatic injury. CTAP showed metastatic disease with multiple subcutaneous metastases, bilateral adrenal masses, right retroperitoneal mass and multiple osseous metastases the largest of which involve the T10 vertebral body with possible thoracic cord compression.  He was initially admitted to the ICU with suspected bacteremia in setting of scrotal infection and progressive metastatic disease. His clinical status stabilized and he was transferred to 2W.  He was found to have an indeterminate DVT involving the left femoral vein, left popliteal vein, left posterior tibial veins and left peroneal veins and chronic DVT involving the right femoral vein. In setting of presumed metastatic cancer,  he was started on Heparin infusion. He was seen by oncologist Dr. Mosetta Putt, recommended biopsy of LLQ abdominal wall mass to clarify primary malignancy. S/P US guided biopsy of LLQ mass on 12/4,  path path report showed adenocarcinoma, primary etiology remains unclear, possible GI etiology.  A GI consult was requested by Dr. Mosetta Putt to rule out primary upper GI malignancy in setting of significant anemia.   He denies having any nausea or vomiting.  No dysphagia or heartburn.  Denies ever having an EGD.  He typically passes a normal brown formed bowel movement daily at home.  No rectal bleeding or black stools.  He underwent a colonoscopy diet Dr. Work 08/2020 which identified 2 tubular adenomatous polyps removed from the colon.  No known family history of esophageal, gastric or colon cancer.  Non-smoker.  No alcohol use. Patient's sister/POA Thurston Hole at the bedside with several other family members.  GI PROCEDURES:  Colonoscopy 08/25/2020 by Dr. Jena Gauss: - Three 4 to 5 mm polyps in the ascending colon, removed with a cold snare. Resected and retrieved. - The examination was otherwise normal on direct and retroflexion views. A. COLON, ASCENDING, POLYPECTOMY:  -  Tubular adenoma (2 of 3 fragments)  -  Benign colonic mucosa (1 of 3 fragments)  -  No high grade dysplasia or malignancy identified   ECHO 11/27/2023: IMPRESSIONS Left ventricular ejection fraction, by estimation, is 60 to 65%. The left ventricle has normal function. The left ventricle has no regional wall motion abnormalities. There is mild left ventricular hypertrophy. Left ventricular diastolic parameters were normal. 1. Right ventricular systolic function is hyperdynamic. The right ventricular size is normal. Tricuspid regurgitation signal is inadequate for assessing PA pressure. 2. The mitral valve is normal in structure. No evidence of mitral valve regurgitation. No evidence of mitral stenosis. 3. Abnormal echodensity seen in the right atrium only in subcostal  view. This is superior to the tricuspid valve annulus. Cannot exclude normal variant anatomy, only seen in clip 65. The tricuspid valve is abnormal. 4. The aortic valve is tricuspid.  Aortic valve regurgitation is mild to moderate. Aortic valve sclerosis is present, with no evidence of aortic valve stenosis. 5. The inferior vena cava is dilated in size with <50% respiratory variability, suggesting right atrial pressure of 15 mmHg  Past Medical History:  Diagnosis Date   Family history of metabolic acidosis with increased anion gap 03/05/2023   GERD (gastroesophageal reflux disease)    History of kidney stones    Hx of AKA (above knee amputation), right (HCC)    Hyperlipidemia    Hypertension    Metastatic disease (HCC)    PAD (peripheral artery disease) (HCC)    S/p R CFA and PTA bypass 11/2022, critical limb ischemia s/p R AKA 02/2023   Prostate cancer [facility]) 2011   radiation june 2020   Septic shock (HCC) 11/26/2023   Umbilical hernia    Wears dentures    full   Wears glasses    Wears partial dentures    lower    Past Surgical History:  Procedure Laterality Date   ABDOMINAL AORTOGRAM W/LOWER EXTREMITY N/A 11/20/2022   Procedure: ABDOMINAL AORTOGRAM W/LOWER EXTREMITY;  Surgeon: Maeola Harman, MD;  Location: [facility] INVASIVE CV LAB;  Service: Cardiovascular;  Laterality: N/A;   ABDOMINAL AORTOGRAM W/LOWER EXTREMITY N/A 03/05/2023   Procedure: ABDOMINAL AORTOGRAM W/LOWER EXTREMITY;  Surgeon: Maeola Harman, MD;  Location: [facility] INVASIVE CV LAB;  Service: Cardiovascular;  Laterality: N/A;   AMPUTATION Right 03/09/2023   Procedure: AMPUTATION ABOVE KNEE;  Surgeon: Maeola Harman, MD;  Location: [facility] OR;  Service: Vascular;  Laterality: Right;   COLONOSCOPY N/A 08/25/2020   Procedure: COLONOSCOPY;  Surgeon: Corbin Ade, MD;  [facility];  Service: Endoscopy;  Laterality: N/A;  9:30   COLONOSCOPY     CYSTOSCOPY WITH LITHOLAPAXY N/A 05/18/2021   Procedure: CYSTOSCOPY WITH LITHOLAPAXY;  Surgeon: Rene Paci, MD;  Location: [facility];  Service: Urology;  Laterality: N/A;  ONLY NEEDS  30 MIN    FEMORAL-TIBIAL BYPASS GRAFT Right 12/12/2022   Procedure: RIGHT COMMON FEMORAL-POSTERIOR TIBIAL ARTERY BYPASS WITH VEIN HARVESTING OF THE GREATER SAPHENOUS VEIN AND FEMORAL ENDARTERECTOMY WITH COMPOSITE GRAFT;  Surgeon: Maeola Harman, MD;  Location: [facility] OR;  Service: Vascular;  Laterality: Right;   IR US GUIDE BX ASP/DRAIN  11/28/2023   POLYPECTOMY  08/25/2020   Procedure: POLYPECTOMY;  Surgeon: Corbin Ade, MD;  Location: [facility];  Service: Endoscopy;;   ROBOT ASSISTED LAPAROSCOPIC RADICAL PROSTATECTOMY  2011   TONSILLECTOMY  age 46   and adenoids removed    Prior to Admission medications   Medication Sig Start Date End Date Taking? Authorizing Provider  acetaminophen (TYLENOL) 325 MG tablet Take 1-2 tablets (325-650 mg total) by mouth every 4 (four) hours as needed for mild pain. 03/26/23  Yes Love, Evlyn Kanner, PA-C  albuterol (VENTOLIN HFA) 108 (90 Base) MCG/ACT inhaler Inhale 2 puffs into the lungs every 6 (six) hours as needed for wheezing or shortness of breath. 10/19/23  Yes Del Newman Nip, Tenna Child, FNP  atorvastatin (LIPITOR) 40 MG tablet Take 1 tablet (40 mg total) by mouth every morning. 04/05/23  Yes Maeola Harman, MD  benzonatate (TESSALON) 200 MG capsule Take 1 capsule (200 mg total) by mouth 2 (two) times daily as needed for cough. 10/10/23  Yes Del  Newman Nip, Tenna Child, FNP  cyclobenzaprine (FLEXERIL) 5 MG tablet Take 1 tablet (5 mg total) by mouth 3 (three) times daily as needed for muscle spasms. 04/25/23  Yes Lovorn, Aundra Millet, MD  gabapentin (NEURONTIN) 100 MG capsule Take 2 capsules (200 mg total) by mouth 2 (two) times daily. Along with 300 mg nightly- for phantom pain- due to R AKA 11/16/23  Yes Lovorn, Aundra Millet, MD  iron polysaccharides (NIFEREX) 150 MG capsule Take 1 capsule (150 mg total) by mouth daily. 10/19/23  Yes Del Newman Nip, Tenna Child, FNP  levocetirizine (XYZAL) 5 MG tablet Take 1 tablet (5 mg total) by mouth every evening. 05/24/23  Yes Del Newman Nip, Tenna Child, FNP  lisinopril-hydrochlorothiazide (ZESTORETIC) 10-12.5 MG tablet Take 1 tablet by mouth daily. 10/10/23  Yes Del Newman Nip, Tenna Child, FNP  metoprolol tartrate (LOPRESSOR) 25 MG tablet Take 0.5 tablets (12.5 mg total) by mouth 2 (two) times daily. Will give 1 month supply- and get from NP/PCP in future. 10/10/23  Yes Del Newman Nip, Tenna Child, FNP  Multiple Vitamins-Minerals (CENTRUM SILVER PO) Take 1 tablet by mouth daily.    Yes [provider]  oxybutynin (DITROPAN-XL) 10 MG 24 hr tablet Take 10 mg by mouth daily. 04/12/23  Yes [provider]  Oxycodone HCl 10 MG TABS Take 1 tablet (10 mg total) by mouth 3 (three) times daily as needed. Patient taking differently: Take 10 mg by mouth 3 (three) times daily as needed (for pain). 03/26/23  Yes Love, Evlyn Kanner, PA-C  pantoprazole (PROTONIX) 40 MG tablet Take 1 tablet (40 mg total) by mouth daily. 03/26/23  Yes Love, Evlyn Kanner, PA-C  polyethylene glycol powder (MIRALAX) 17 GM/SCOOP powder Take 17 g by mouth daily as needed for mild constipation. 10/10/23  Yes Del Newman Nip, Tenna Child, FNP  senna-docusate (SENOKOT-S) 8.6-50 MG tablet Take 2 tablets by mouth daily after supper. 03/26/23  Yes Love, Evlyn Kanner, PA-C  sodium bicarbonate 650 MG tablet Take 1 tablet (650 mg total) by mouth 2 (two) times daily. 03/26/23  Yes Love, Evlyn Kanner, PA-C  gabapentin (NEURONTIN) 300 MG capsule Take 1 capsule by mouth at bedtime Patient not taking: Reported on 11/28/2023 10/30/23   Genice Rouge, MD    Current Facility-Administered Medications  Medication Dose Route Frequency Provider Last Rate Last Admin   0.9 %  sodium chloride infusion (Manually program via Guardrails IV Fluids)   Intravenous Once Paliwal, Aditya, MD       acetaminophen (TYLENOL) tablet 650 mg  650 mg Oral Q6H PRN Janalyn Shy, Subrina, MD   650 mg at 11/30/23 0225   aspirin chewable tablet 81 mg  81 mg Oral Daily Cheri Fowler, MD   81 mg at 11/30/23 0837   ceFEPIme (MAXIPIME) 2 g  in sodium chloride 0.9 % 100 mL IVPB  2 g Intravenous Q8H Arabella Merles, RPH 200 mL/hr at 11/30/23 0325 2 g at 11/30/23 0325   Chlorhexidine Gluconate Cloth 2 % PADS 6 each  6 each Topical Daily Cheri Fowler, MD   6 each at 11/30/23 0841   docusate sodium (COLACE) capsule 100 mg  100 mg Oral BID PRN Cheri Fowler, MD       feeding supplement (ENSURE ENLIVE / ENSURE PLUS) liquid 237 mL  237 mL Oral BID BM Narda Bonds, MD   237 mL at 11/30/23 0841   Gerhardt's butt cream   Topical BID Narda Bonds, MD   Given at 11/30/23 [phone]   heparin ADULT infusion 100 units/mL (25000 units/248mL)  1,750 Units/hr Intravenous Continuous Vivia Birmingham, RPH 17.5 mL/hr at 11/30/23 0945 1,750 Units/hr at 11/30/23 0945   lactated ringers infusion   Intravenous Continuous Sundil, Subrina, MD 100 mL/hr at 11/30/23 0947 New Bag at 11/30/23 0947   multivitamin with minerals tablet 1 tablet  1 tablet Oral Daily Narda Bonds, MD   1 tablet at 11/30/23 1610   nystatin (MYCOSTATIN/NYSTOP) topical powder   Topical BID Tereasa Coop, MD   Given at 11/30/23 [phone]   Oral care mouth rinse  15 mL Mouth Rinse PRN Lynnell Catalan, MD       oxyCODONE (Oxy IR/ROXICODONE) immediate release tablet 2.5 mg  2.5 mg Oral Q6H PRN Conrad Nelson, MD       Or   oxyCODONE (Oxy IR/ROXICODONE) immediate release tablet 5 mg  5 mg Oral Q6H PRN Paliwal, Aditya, MD   5 mg at 11/30/23 0154   polyethylene glycol (MIRALAX / GLYCOLAX) packet 17 g  17 g Oral Daily PRN Cheri Fowler, MD       vancomycin (VANCOREADY) IVPB 750 mg/150 mL  750 mg Intravenous Q12H Arabella Merles, RPH       Facility-Administered Medications Ordered in Other Encounters  Medication Dose Route Frequency Provider Last Rate Last Admin   0.9 %  sodium chloride infusion (Manually program via Guardrails IV Fluids)  250 mL Intravenous Continuous Doreatha Massed, MD   Stopped at 10/25/23 1356    Allergies as of 11/26/2023 - Review Complete 11/26/2023  Allergen Reaction  Noted   Shellfish allergy Swelling 03/18/2019    Family History  Problem Relation Age of Onset   Gastric cancer Mother    Prostate cancer Father    Breast cancer Neg Hx    Pancreatic cancer Neg Hx     Social History   Socioeconomic History   Marital status: Single    Spouse name: Not on file   Number of children: 1   Years of education: Not on file   Highest education level: Some college, no degree  Occupational History    Comment: customer service   Tobacco Use   Smoking status: Former    Current packs/day: 0.00    Average packs/day: 0.3 packs/day for 21.0 years (5.3 ttl pk-yrs)    Types: Cigarettes    Start date: 11/29/2001    Quit date: 11/29/2022    Years since quitting: 1.0    Passive exposure: Never   Smokeless tobacco: Never  Vaping Use   Vaping status: Never Used  Substance and Sexual Activity   Alcohol use: Never   Drug use: Never   Sexual activity: Not Currently  Other Topics Concern   Not on file  Social History Narrative   Has one son.    Social Determinants of Health   Financial Resource Strain: Medium Risk (10/09/2023)   Overall Financial Resource Strain (CARDIA)    Difficulty of Paying Living Expenses: Somewhat hard  Food Insecurity: Patient Unable To Answer (11/26/2023)   Hunger Vital Sign    Worried About Running Out of Food in the Last Year: Patient unable to answer    Ran Out of Food in the Last Year: Patient unable to answer  Recent Concern: Food Insecurity - Food Insecurity Present (10/09/2023)   Hunger Vital Sign    Worried About Running Out of Food in the Last Year: Sometimes true    Ran Out of Food in the Last Year: Sometimes true  Transportation Needs: No Transportation Needs (10/09/2023)   PRAPARE - Transportation  Lack of Transportation (Medical): No    Lack of Transportation (Non-Medical): No  Physical Activity: Unknown (10/09/2023)   Exercise Vital Sign    Days of Exercise per Week: 0 days    Minutes of Exercise per Session:  Not on file  Stress: No Stress Concern Present (10/09/2023)   [employer] of Occupational Health - Occupational Stress Questionnaire    Feeling of Stress : Not at all  Social Connections: Moderately Integrated (10/09/2023)   Social Connection and Isolation Panel [NHANES]    Frequency of Communication with Friends and Family: More than three times a week    Frequency of Social Gatherings with Friends and Family: Never    Attends Religious Services: 1 to 4 times per year    Active Member of [employer] or Organizations: Yes    Attends Banker Meetings: Patient declined    Marital Status: Never married  Intimate Partner Violence: Not At Risk (11/30/2023)   Humiliation, Afraid, Rape, and Kick questionnaire    Fear of Current or Ex-Partner: No    Emotionally Abused: No    Physically Abused: No    Sexually Abused: No    Review of Systems: Gen: Denies fever, sweats or chills. No weight loss.  CV: Denies chest pain, palpitations or edema. Resp: Denies cough, shortness of breath of hemoptysis.  GI:See HPI. GU : Denies urinary burning, blood in urine, increased urinary frequency or incontinence. MS: R AKA.  Derm: Denies rash, itchiness, skin lesions or unhealing ulcers. Psych:  +Depression. Heme: Denies easy bruising, bleeding. Neuro:  Denies headaches, dizziness or paresthesias. Endo:  Denies any problems with DM, thyroid or adrenal function.  Physical Exam: Vital signs in last 24 hours: Temp:  [97.6 F (36.4 C)-101.3 F (38.5 C)] 99 F (37.2 C) (12/06 0924) Pulse Rate:  [84-125] 104 (12/06 0924) Resp:  [16-20] 16 (12/06 0924) BP: (104-117)/(48-70) 106/49 (12/06 0924) SpO2:  [93 %-97 %] 93 % (12/06 0924) Weight:  [96.6 kg] 96.6 kg (12/06 0421) Last BM Date : 11/30/23 General: Alert fatigued appearing 72 year old male in no acute distress. Head:  Normocephalic and atraumatic. Eyes:  No scleral icterus. Conjunctiva pink. Ears:  Normal auditory acuity. Nose:  No  deformity, discharge or lesions. Mouth:  Dentition intact. No ulcers or lesions.  Neck:  Supple. No lymphadenopathy or thyromegaly.  Lungs: Breath sounds clear throughout.  Decreased in the bases. Heart: Regular rate and rhythm, no murmurs. Abdomen: Abdomen is  protuberant, palpable mass to the left mid abdomen.  Nontender.  Positive bowel sounds to all 4 quadrants.  No bruit. Rectal: Deferred. Musculoskeletal:  Symmetrical without gross deformities.  Pulses:  Normal pulses noted. Extremities:  Right AKA.  Mild edema to upper extremities. Neurologic:  Alert and  oriented x 4. No focal deficits.  Skin: Scrotal wound dressing intact. Psych:  Alert and cooperative. Normal mood and affect.  Intake/Output from previous day: 12/05 0701 - 12/06 0700 In: 1004.9 [P.O.:360; I.V.:489.7; IV Piggyback:155.3] Out: 1825 [Urine:1825] Intake/Output this shift: No intake/output data recorded.  Lab Results: Recent Labs    11/28/23 0324 11/28/23 1432 11/29/23 0659 11/30/23 0528  WBC 10.3  --  10.7* 12.5*  HGB 6.7* 8.5* 8.4* 7.4*  HCT 21.3* 27.7* 26.1* 23.0*  PLT 210  --  257 256   BMET Recent Labs    11/28/23 0324 11/29/23 0659 11/30/23 0528  NA 138 134* 133*  K 3.6 3.8 3.9  CL 106 102 104  CO2 27 25 22   GLUCOSE 132* 98 128*  BUN 43* 29* 24*  CREATININE 1.19 0.97 0.97  CALCIUM 9.9 10.0 9.9   LFT Recent Labs    11/28/23 0323 11/30/23 0528  PROT 5.0* 4.7*  ALBUMIN 1.5* <1.5*  AST 89* 58*  ALT 58* 51*  ALKPHOS 75 72  BILITOT 0.6 0.4  BILIDIR 0.2  --   IBILI 0.4  --    PT/INR No results for input(s): "LABPROT", "INR" in the last 72 hours. Hepatitis Panel No results for input(s): "HEPBSAG", "HCVAB", "HEPAIGM", "HEPBIGM" in the last 72 hours.    Studies/Results: IR US Guide Bx Asp/Drain  Result Date: 11/28/2023 INDICATION: 72 year old male referred for biopsy. He has suspicion of lung carcinoma EXAM: ULTRASOUND-GUIDED BIOPSY OF LEFT LOWER QUADRANT ABDOMINAL WALL MASS.  MEDICATIONS: None. ANESTHESIA/SEDATION: None COMPLICATIONS: None PROCEDURE: Informed written consent was obtained from the patient after a thorough discussion of the procedural risks, benefits and alternatives. All questions were addressed. Maximal Sterile Barrier Technique was utilized including caps, mask, sterile gowns, sterile gloves, sterile drape, hand hygiene and skin antiseptic. A timeout was performed prior to the initiation of the procedure. The procedure, risks, benefits, and alternatives were explained to the patient. Questions regarding the procedure were encouraged and answered. The patient understands and consents to the procedure. Ultrasound survey was performed with images stored and sent to PACs. The left anterior abdominal wall was prepped with chlorhexidine in a sterile fashion, and a sterile drape was applied covering the operative field. A sterile gown and sterile gloves were used for the procedure. Local anesthesia was provided with 1% Lidocaine. Ultrasound guidance was used to infiltrate the region with 1% lidocaine for local anesthesia. Multiple 18 gauge core biopsy were then acquired of the soft tissue nodule using ultrasound guidance. Images were stored. Final image was stored after biopsy. Patient tolerated the procedure well and remained hemodynamically stable throughout. No complications were encountered and no significant blood loss was encounter IMPRESSION: Status post ultrasound-guided anterior abdominal wall mass biopsy Signed, Yvone Neu. Miachel Roux, RPVI [facility] Radiology Electronically Signed   By: Gilmer Mor D.O.   On: 11/28/2023 13:54    IMPRESSION/PLAN:  72 year old male with metastatic disease with unknown primary. Chest/abd/pelvic CT showed multiple subcutaneous metastases, bilateral adrenal masses, right retroperitoneal masses and multiple osseous metastases, the largest osseous metastasis involves the T10  vertebral body with intraspinal extension and possible thoracic cord compression. S/P US guided biopsy of LLQ abdominal wall mass per IR 12/4, path report consistent with adenocarcinoma. Endoscopic evaluation requested by oncology to rule out primary UGI malignancy in setting of anemia.  -EGD benefits and risks discussed with patient and sister/POA including risk with sedation, risk of bleeding, perforation and infection.  -NPO after midnight  -Hold Heparin infusion 4 hours prior to EGD   Normocytic anemia. On oral iron at home. Hg 8.4 -> 7.4.  -Transfuse for Hg < 7.5 or as needed if symptomatic.  -H/H -CBC in am  Elevated LFTs, trending downward. T. Bili 0.4. Alk phos 72. AST 58. ALT 51. CTAP 12/2 showed a small liver lesion, likely cyst without gallbladder wall thickening or biliary ductal dilatation.  -Hepatic panel in am  History of prostate cancer s/p prostatectomy and radiation therapy 2020   Sepsis, likely due to scrotal/penile cellulitis/candidiasis on IV Vanco and Cefepime. WBC 12.5. Pelvic MRI results pending. Evaluated by wound care team.   LLE DVT, chronic RLE  DVT.  Indeterminate DVT involving the left femoral vein, left popliteal vein, left posterior tibial veins  and left peroneal veins. Chronic DVT involving the right femoral vein. In setting of presumed metastatic cancer patient was started on Heparin infusion.  -Hold Heparin 4 hours prior to EGD   PVD s/p AKA 01/2023  Coronary artery and aortic atherosclerosis per CT   [facility]  11/30/2023, 3:55PM

## 2023-12-01 ENCOUNTER — Inpatient Hospital Stay (HOSPITAL_COMMUNITY): Payer: Medicare Other | Admitting: Anesthesiology

## 2023-12-01 ENCOUNTER — Encounter (HOSPITAL_COMMUNITY)
Admission: EM | Disposition: A | Discharge status: Skilled Nursing Facility | Payer: Self-pay | Source: Home / Self Care | Attending: Internal Medicine

## 2023-12-01 ENCOUNTER — Encounter (HOSPITAL_COMMUNITY): Payer: Self-pay | Admitting: Internal Medicine

## 2023-12-01 DIAGNOSIS — K3189 Other diseases of stomach and duodenum: Secondary | ICD-10-CM

## 2023-12-01 DIAGNOSIS — C799 Secondary malignant neoplasm of unspecified site: Secondary | ICD-10-CM | POA: Diagnosis not present

## 2023-12-01 DIAGNOSIS — E440 Moderate protein-calorie malnutrition: Secondary | ICD-10-CM | POA: Diagnosis not present

## 2023-12-01 DIAGNOSIS — N179 Acute kidney failure, unspecified: Secondary | ICD-10-CM | POA: Diagnosis not present

## 2023-12-01 DIAGNOSIS — R571 Hypovolemic shock: Secondary | ICD-10-CM | POA: Diagnosis not present

## 2023-12-01 HISTORY — PX: ESOPHAGOGASTRODUODENOSCOPY (EGD) WITH PROPOFOL: SHX5813

## 2023-12-01 LAB — CBC
HCT: 25.6 % — ABNORMAL LOW (ref 39.0–52.0)
Hemoglobin: 8.1 g/dL — ABNORMAL LOW (ref 13.0–17.0)
MCH: 27.4 pg (ref 26.0–34.0)
MCHC: 31.6 g/dL (ref 30.0–36.0)
MCV: 86.5 fL (ref 80.0–100.0)
Platelets: 302 10*3/uL (ref 150–400)
RBC: 2.96 MIL/uL — ABNORMAL LOW (ref 4.22–5.81)
RDW: 17.7 % — ABNORMAL HIGH (ref 11.5–15.5)
WBC: 14.9 10*3/uL — ABNORMAL HIGH (ref 4.0–10.5)
nRBC: 0 % (ref 0.0–0.2)

## 2023-12-01 LAB — CULTURE, BLOOD (ROUTINE X 2)
Culture: NO GROWTH
Culture: NO GROWTH

## 2023-12-01 LAB — HEPARIN LEVEL (UNFRACTIONATED)
Heparin Unfractionated: 0.35 [IU]/mL (ref 0.30–0.70)
Heparin Unfractionated: 0.23 [IU]/mL — ABNORMAL LOW (ref 0.30–0.70)

## 2023-12-01 SURGERY — ESOPHAGOGASTRODUODENOSCOPY (EGD) WITH PROPOFOL
Anesthesia: Monitor Anesthesia Care

## 2023-12-01 MED ORDER — PHENYLEPHRINE 80 MCG/ML (10ML) SYRINGE FOR IV PUSH (FOR BLOOD PRESSURE SUPPORT)
PREFILLED_SYRINGE | INTRAVENOUS | Status: DC | PRN
  Administered 2023-12-01: 40 ug via INTRAVENOUS

## 2023-12-01 MED ORDER — LIDOCAINE 2% (20 MG/ML) 5 ML SYRINGE
INTRAMUSCULAR | Status: DC | PRN
  Administered 2023-12-01: 100 mg via INTRAVENOUS

## 2023-12-01 MED ORDER — PROPOFOL 10 MG/ML IV BOLUS
INTRAVENOUS | Status: DC | PRN
  Administered 2023-12-01: 50 mg via INTRAVENOUS

## 2023-12-01 MED ORDER — HEPARIN (PORCINE) 25000 UT/250ML-% IV SOLN
2050.0000 [IU]/h | INTRAVENOUS | Status: DC
  Administered 2023-12-01: 1950 [IU]/h via INTRAVENOUS
  Administered 2023-12-02: 2050 [IU]/h via INTRAVENOUS
  Administered 2023-12-02: 1950 [IU]/h via INTRAVENOUS
  Administered 2023-12-03: 2050 [IU]/h via INTRAVENOUS
  Filled 2023-12-01 (×3): qty 250

## 2023-12-01 MED ORDER — HEPARIN (PORCINE) 25000 UT/250ML-% IV SOLN
1850.0000 [IU]/h | INTRAVENOUS | Status: DC
  Administered 2023-12-01 (×2): 1850 [IU]/h via INTRAVENOUS
  Filled 2023-12-01: qty 250

## 2023-12-01 MED ORDER — PROPOFOL 500 MG/50ML IV EMUL
INTRAVENOUS | Status: DC | PRN
  Administered 2023-12-01: 100 ug/kg/min via INTRAVENOUS

## 2023-12-01 SURGICAL SUPPLY — 14 items

## 2023-12-01 NOTE — Progress Notes (Signed)
PROGRESS NOTE    [name]  WUJ:[id number] DOB: [birth year] DOA: 11/26/2023 PCP: Rica Records, FNP   Brief Narrative: [name] is a 72 y.o. male with a history of hypertension, hyperlipidemia, PAD status post right AKA, CKD stage IIIa, GERD, prostate cancer status post prostatectomy.  Patient presented secondary to being found down on the ground and altered and was found to have evidence of hypotension concerning for septic shock with additional evidence of rhabdomyolysis.  Patient started empirically on antibiotics, started on vasopressor support, and IV fluids and was transferred to [facility] from [facility] for ICU admission.  Antibiotics were discontinued secondary to unlikely infectious etiology.  Hypotension and rhabdomyolysis improved with IV fluids and vasopressor support was discontinued.  Admission complicated by the identification of metastatic disease of unknown primary   Assessment and Plan:  Hypovolemic shock Sepsis ruled out. Patient managed in ICU with norepinephrine and fluid resuscitation.   Traumatic rhabdomyolysis Secondary to being down. CK of 6,948 on admission. Associated elevated AST/ALT. Improvement with IV fluids.  Acute anemia Anemia of chronic disease Baseline hemoglobin of 8-9 d/dL. During this admission, hemoglobin drifted down to a low of 6.7 g/dL requiring a transfusion of 1 unit of PRBC on 12/4. Post-transfusion hemoglobin of 8.5 g/dL. Hemoglobin appears stable.  AKI Baseline creatinine appears to be around 1.3; unclear if patient has CKD stage IIIa vs CKD stage II. Creatinine of 2.21 on admission with improvement to 0.97 prior to discharge.  GERD Continue Protonix.  Metastatic disease Unknown primary. Metastatic disease noted of right  adrenal gland in addition to bony metastasis noted at T5, T6 and T10 with associated pathologic fractures, in addition to the anterior right fifth rib. Medical oncology  consulted. Enlarged right cervical lymph node noted in addition to hilar mass/adenopathy with mediastinal, axillary and right supraclavicular adenopathy on CT. Bedside US guided biopsy of left anterior abdominal wall mass performed on 12/4 which is positive for adenocarcinoma. Upper endoscopy negative for upper GI malignancy.  Central canal narrowing Noted on CT imaging at T10 secondary to extension of metastatic disease. Severe spinal stenosis at T1 and mild stenosis at T10 noted on MRI . No cord compression noted on MRI.  Primary hypertension Antihypertensives held secondary to hypovolemic shock.  Hyperlipidemia Lipitor held.  PAD History of right AKA. -Continue aspirin.  Age indeterminate left lower extremity DVT Chronic right lower extremity DVT Indeterminate DVT involving the left femoral vein, left popliteal vein, left posterior tibial veins and left peroneal veins. Chronic DVT involving the right femoral vein. In setting of presumed metastatic cancer. Patient started on heparin IV. -Continue heparin IV; transition to Eliquis once no more procedures are expected  Acute right upper extremity DVT Acute right upper extremity SVT Acute DVT involving the right subclavian vein, right axillary vein and right brachial veins. Acute SVT involving the right basilic vein and right cephalic vein  Scrotal cellulitis/candidiasis MRI without deep infection, abscess, or features necrotizing disease. Infection appears to be improving. -Continue Vancomycin, Cefepime, Nystatin -Follow-up culture data  Sepsis Unclear is present on admission, but recurrent criteria met during hospitalization secondary to scrotal infection. Blood cultures obtained. Empiric vancomycin and Cefepime started for treatment. -Follow-up blood cultures  Phantom limb pain Noted.  Moderate malnutrition Dietitian recommendations (12/5): Ensure Plus High Protein po BID, each supplement provides 350 kcal and 20 grams of  protein. Multivitamin with minerals   Pressure injury Distal left/medial pretibial area. Present on admission.   DVT prophylaxis:  Heparin IV Code Status:   Code Status: Full Code Family Communication: None at bedside Disposition Plan: Discharge to SNF pending ongoing workup for metastatic cancer/oncology recommendations and transition to outpatient antibiotics.   Consultants:  PCCM Medical oncology Interventional radiology  Procedures:  12/4: Bedside US guided biopsy of left ant abd wall mass   Antimicrobials: Zosyn Vancomycin Cefepime    Subjective: No concerns this morning. Feels well.  Objective: BP 120/62 (BP Location: Right Arm)   Pulse 97   Temp 98.4 F (36.9 C) (Oral)   Resp 16   Ht 5\' 10"  (1.778 m)   Wt 97.2 kg   SpO2 94%   BMI 30.75 kg/m   Examination:  General exam: Appears calm and comfortable Respiratory system: Clear to auscultation. Respiratory effort normal. Cardiovascular system: S1 & S2 heard, RRR. Gastrointestinal system: Abdomen is nondistended, soft and nontender. Normal bowel sounds heard. Central nervous system: Alert and oriented. No focal neurological deficits. Musculoskeletal: Mild RUE edema and 2+ LLE edema. No calf tenderness Psychiatry: Judgement and insight appear normal. Mood & affect appropriate.    Data Reviewed: I have personally reviewed following labs and imaging studies  CBC Lab Results  Component Value Date   WBC 14.9 (H) 12/01/2023   RBC 2.96 (L) 12/01/2023   HGB 8.1 (L) 12/01/2023   HCT 25.6 (L) 12/01/2023   MCV 86.5 12/01/2023   MCH 27.4 12/01/2023   PLT 302 12/01/2023   MCHC 31.6 12/01/2023   RDW 17.7 (H) 12/01/2023   LYMPHSABS 2.2 11/30/2023   MONOABS 0.9 11/30/2023   EOSABS 0.1 11/30/2023   BASOSABS 0.0 11/30/2023     Last metabolic panel Lab Results  Component Value Date   NA 133 (L) 11/30/2023   K 3.9 11/30/2023   CL 104 11/30/2023   CO2 22 11/30/2023   BUN 24 (H) 11/30/2023   CREATININE  0.97 11/30/2023   GLUCOSE 128 (H) 11/30/2023   GFRNONAA >60 11/30/2023   GFRAA (L) 03/18/2010    60        The eGFR has been calculated using the MDRD equation. This calculation has not been validated in all clinical situations. eGFR's persistently <60 mL/min signify possible Chronic Kidney Disease.   CALCIUM 9.9 11/30/2023   PHOS 3.2 11/27/2023   PROT 4.7 (L) 11/30/2023   ALBUMIN <1.5 (L) 11/30/2023   LABGLOB 4.2 (H) 10/23/2023   AGRATIO 0.6 (L) 10/23/2023   BILITOT 0.4 11/30/2023   ALKPHOS 72 11/30/2023   AST 58 (H) 11/30/2023   ALT 51 (H) 11/30/2023   ANIONGAP 7 11/30/2023    GFR: Estimated Creatinine Clearance: 79.3 mL/min (by C-G formula based on SCr of 0.97 mg/dL).  Recent Results (from the past 240 hour(s))  Blood culture (routine x 2)     Status: None   Collection Time: 11/26/23 12:13 PM   Specimen: BLOOD  Result Value Ref Range Status   Specimen Description BLOOD BLOOD RIGHT ARM  Final   Special Requests   Final    BOTTLES DRAWN AEROBIC AND ANAEROBIC Blood Culture results may not be optimal due to an inadequate volume of blood received in culture bottles   Culture   Final    NO GROWTH 5 DAYS Performed at [facility], [address] [place], [place] [address]    Report Status 12/01/2023 FINAL  Final  Blood culture (routine x 2)     Status: None   Collection Time: 11/26/23 12:20 PM   Specimen: BLOOD  Result Value Ref Range Status  Specimen Description BLOOD BLOOD RIGHT HAND AEROBIC BOTTLE ONLY  Final   Special Requests   Final    Blood Culture results may not be optimal due to an inadequate volume of blood received in culture bottles BOTTLES DRAWN AEROBIC ONLY   Culture   Final    NO GROWTH 5 DAYS Performed at [facility], [address] [place], [place] [address]    Report Status 12/01/2023 FINAL  Final  MRSA Next Gen by PCR, Nasal     Status: None   Collection Time: 11/26/23  9:02 PM   Specimen: Nasal Mucosa; Nasal Swab  Result Value Ref Range  Status   MRSA by PCR Next Gen NOT DETECTED NOT DETECTED Final    Comment: (NOTE) The GeneXpert MRSA Assay (FDA approved for NASAL specimens only), is one component of a comprehensive MRSA colonization surveillance program. It is not intended to diagnose MRSA infection nor to guide or monitor treatment for MRSA infections. Test performance is not FDA approved in patients less than 54 years old. Performed at [facility], [address] [place], [place] [address]   Culture, blood (Routine X 2) w Reflex to ID Panel     Status: None (Preliminary result)   Collection Time: 11/30/23  5:25 AM   Specimen: BLOOD RIGHT ARM  Result Value Ref Range Status   Specimen Description BLOOD RIGHT ARM  Final   Special Requests   Final    BOTTLES DRAWN AEROBIC AND ANAEROBIC Blood Culture results may not be optimal due to an inadequate volume of blood received in culture bottles   Culture   Final    NO GROWTH 1 DAY Performed at [facility], [address] [place], [place] [address]    Report Status PENDING  Incomplete  Culture, blood (Routine X 2) w Reflex to ID Panel     Status: None (Preliminary result)   Collection Time: 11/30/23  5:26 AM   Specimen: BLOOD RIGHT ARM  Result Value Ref Range Status   Specimen Description BLOOD RIGHT ARM  Final   Special Requests   Final    BOTTLES DRAWN AEROBIC AND ANAEROBIC Blood Culture results may not be optimal due to an inadequate volume of blood received in culture bottles   Culture   Final    NO GROWTH 1 DAY Performed at [facility], [address], [place], [place] [address]    Report Status PENDING  Incomplete      Radiology Studies: MR PELVIS W WO CONTRAST  Result Date: 11/30/2023 CLINICAL DATA:  Soft tissue infection suspected evaluate for abscess and gas formation underneath the skin around the groin, penile, and scrotal area, clinical concern for cellulitis and candidiasis. Metastatic malignancy with soft tissue metastases in  the pelvis. EXAM: MRI PELVIS WITHOUT AND WITH CONTRAST TECHNIQUE: Multiplanar multisequence MR imaging of the pelvis was performed both before and after administration of intravenous contrast. CONTRAST:  9mL GADAVIST GADOBUTROL 1 MMOL/ML IV SOLN COMPARISON:  CT abdomen pelvis, 11/26/2023 FINDINGS: Urinary Tract:  No abnormality visualized. Bowel:  Unremarkable visualized pelvic bowel loops. Vascular/Lymphatic: Multiple enlarged bilateral inguinal lymph nodes left inguinal nodes measuring up to 2.0 x 1.3 cm (series 13, image 15). No significant vascular abnormality seen. Reproductive: Status post prostatectomy. Soft tissue edema of the scrotum and penis. No focal fluid collection. Other: Diffuse anasarca. Multiple enhancing subcutaneous soft tissue nodules, including within the pubic subcutaneous fat measuring 3.3 x 2.8 cm (series 13, image 18), in the subcutaneous  fat of the anterior left thigh measuring 2.1 x 1.9 cm (series 13, image 8), overlying the left hamstring musculature measuring 2.6 x 2.0 cm (series 13, image 56), and in the right biceps femoris muscle body measuring 2.6 x 2.5 cm (series 13, image 66). Musculoskeletal: Multiple small enhancing osseous metastases, including of the greater trochanter of the left femur (series 13, image 6), and of the left ischial ramus (series 13, image 21). IMPRESSION: 1. Soft tissue edema of the scrotum and penis, consistent with cellulitis. No focal fluid collection or gas to suggest abscess or necrotizing fasciitis. 2. Multiple enhancing subcutaneous and intramuscular metastatic soft tissue nodules throughout the pelvis and included proximal femurs. 3. Multiple enlarged bilateral inguinal lymph nodes, possibly reactive although highly suspicious for nodal metastases. 4. Multiple small enhancing osseous metastases. 5. Status post prostatectomy. Electronically Signed   By: Jearld Lesch M.D.   On: 11/30/2023 15:32   VAS [place] UPPER EXTREMITY VENOUS DUPLEX  Result Date:  11/30/2023 UPPER VENOUS STUDY  Patient Name:  [name]  Date of Exam:   11/30/2023 Medical Rec #: [id number]            Accession #:    [id number] Date of Birth: 06/10/1951            Patient Gender: M Patient Age:   55 years Exam Location:  [facility] Procedure:      VAS [place] UPPER EXTREMITY VENOUS DUPLEX Referring Phys: Avon Mergenthaler --------------------------------------------------------------------------------  Indications: Swelling Other Indications: Testing upon admission revealed multiple enlarged cervical lymph nodes predominantly on the right side. Adenopathy also envolving right axillary and supraclavicular areas. Soft tissue lesions seen on CT and MRI. Anticoagulation: Heparin. Limitations: Poor ultrasound/tissue interface and line. Comparison Study: No previous UEV exams Performing Technologist: Jody Hill RVT, RDMS  Examination Guidelines: A complete evaluation includes B-mode imaging, spectral Doppler, color Doppler, and power Doppler as needed of all accessible portions of each vessel. Bilateral testing is considered an integral part of a complete examination. Limited examinations for reoccurring indications may be performed as noted.  Right Findings: +----------+------------+---------+-----------+----------+---------------------+ RIGHT     CompressiblePhasicitySpontaneousProperties       Summary        +----------+------------+---------+-----------+----------+---------------------+ IJV           Full       Yes       Yes                                  +----------+------------+---------+-----------+----------+---------------------+ Subclavian    None       No        No                Acute - mid and                                                               distal         +----------+------------+---------+-----------+----------+---------------------+ Axillary      None       No        No                       Acute          +----------+------------+---------+-----------+----------+---------------------+  Brachial    Partial                                 Acute proximal - one                                                            of paired       +----------+------------+---------+-----------+----------+---------------------+ Radial        Full                                                        +----------+------------+---------+-----------+----------+---------------------+ Ulnar         Full                                                        +----------+------------+---------+-----------+----------+---------------------+ Cephalic      None       No        No                       Acute         +----------+------------+---------+-----------+----------+---------------------+ Basilic       None       No        No                       Acute         +----------+------------+---------+-----------+----------+---------------------+   Extrinsic compression of jugular and proximal subclavian veins from several solid appearing masses of neck and clavicle area. Cannot differentiate thyroid from myph nodes / masses. Subcutaneous fluid of right neck.  Left Findings: +----------+------------+---------+-----------+----------+------------------+ LEFT      CompressiblePhasicitySpontaneousProperties     Summary       +----------+------------+---------+-----------+----------+------------------+ IJV           Full       Yes       Yes                                 +----------+------------+---------+-----------+----------+------------------+ Subclavian    Full       Yes       Yes                                 +----------+------------+---------+-----------+----------+------------------+ Axillary      Full       Yes       Yes                                 +----------+------------+---------+-----------+----------+------------------+ Brachial      Full       Yes       Yes                                  +----------+------------+---------+-----------+----------+------------------+  Radial        Full                                                     +----------+------------+---------+-----------+----------+------------------+ Ulnar                                               unable to insonate +----------+------------+---------+-----------+----------+------------------+ Cephalic      None       No        No                     Acute        +----------+------------+---------+-----------+----------+------------------+ Basilic       Full       Yes       Yes                                 +----------+------------+---------+-----------+----------+------------------+ hypoechoic heterogenous superficial solid appearing structure seen proximally to axillary region (3.0 x 1.6 x 2.5 cm) & in mid upper arm (1.9 x 1.1 x 1.5 cm). hypoechoic solid appearing intramuscular structure seen in mid upper arm (2.0 x 0.9 x 1.1 cm).  Summary:  Right: Findings consistent with acute deep vein thrombosis involving the right subclavian vein, right axillary vein and right brachial veins. Findings consistent with acute superficial vein thrombosis involving the right basilic vein and right cephalic vein.  Left: Findings consistent with acute superficial vein thrombosis involving the left cephalic vein. However, unable to visualize the ulnar veins.  *See table(s) above for measurements and observations.  Diagnosing physician: Carolynn Sayers Electronically signed by Carolynn Sayers on 11/30/2023 at 2:15:21 PM.    Final       LOS: 5 days    Jacquelin Hawking, MD [facility] 12/01/2023, 1:56 PM   If 7PM-7AM, please contact night-coverage [email or web address]

## 2023-12-01 NOTE — Anesthesia Postprocedure Evaluation (Signed)
Anesthesia Post Note  Patient: BENEDETTO VIDAURRI  Procedure(s) Performed: ESOPHAGOGASTRODUODENOSCOPY (EGD) WITH PROPOFOL     Patient location during evaluation: PACU Anesthesia Type: MAC Level of consciousness: awake and alert Pain management: pain level controlled Vital Signs Assessment: post-procedure vital signs reviewed and stable Respiratory status: spontaneous breathing, nonlabored ventilation and respiratory function stable Cardiovascular status: stable and blood pressure returned to baseline Anesthetic complications: no   No notable events documented.  Last Vitals:  Vitals:   12/01/23 1008 12/01/23 1010  BP: 120/75 120/75  Pulse: (!) 101 98  Resp: 19 13  Temp:    SpO2: 91% 92%    Last Pain:  Vitals:   12/01/23 0959  TempSrc:   PainSc: 0-No pain                 Beryle Lathe

## 2023-12-01 NOTE — Progress Notes (Signed)
PHARMACY - ANTICOAGULATION CONSULT NOTE  Pharmacy Consult for Heparin Indication: DVT  Allergies  Allergen Reactions   Shellfish Allergy Swelling    Patient Measurements: Height: 5\' 10"  (177.8 cm) Weight: 97.2 kg (214 lb 4.6 oz) IBW/kg (Calculated) : 73 Heparin Dosing Weight: 91.3 kg  Vital Signs: Temp: 98 F (36.7 C) (12/07 0956) Temp Source: Temporal (12/07 0956) BP: 120/75 (12/07 1010) Pulse Rate: 98 (12/07 1010)  Labs: Recent Labs    11/29/23 0659 11/29/23 0827 11/30/23 0528 11/30/23 0744 11/30/23 1817 12/01/23 0522  HGB 8.4*  --  7.4*  --  8.2* 8.1*  HCT 26.1*  --  23.0*  --  25.5* 25.6*  PLT 257  --  256  --   --  302  HEPARINUNFRC 0.35  --   --  0.21* 0.24* 0.35  CREATININE 0.97  --  0.97  --   --   --   CKTOTAL  --  529*  --   --   --   --     Estimated Creatinine Clearance: 79.3 mL/min (by C-G formula based on SCr of 0.97 mg/dL).  Assessment: Patient admitted following a fall and was found down after 48 hours. Vascular US showing age indeterminate DVT in LLE, chronic DVT in RLE, and acute DVT in RUE.  Pharmacy consulted to dose IV heparin.    Heparin now therapeutic on 1850 units/hr.  Heparin was held this AM for EGD to eval for malignancy.  No bleeding noted.  No biopsies taken.  Confirmed with Dr. Adela Lank, ok to resume heparin.    Goal of Therapy:  Heparin level 0.3-0.7 units/ml Monitor platelets by anticoagulation protocol: Yes   Plan:  Restart heparin at 1850 units/hr. Check heparin level in 8 hours. Monitor for signs/symptoms of bleeding. Follow up oral anticoagulation plans.  [employer], Pharm.D., BCPS Clinical Pharmacist  **Pharmacist phone directory can be found on [email or web address] listed under [facility].  12/01/2023 11:12 AM

## 2023-12-01 NOTE — Transfer of Care (Signed)
Immediate Anesthesia Transfer of Care Note  Patient: [name]  Procedure(s) Performed: ESOPHAGOGASTRODUODENOSCOPY (EGD) WITH PROPOFOL  Patient Location: PACU and Endoscopy Unit  Anesthesia Type:MAC  Level of Consciousness: awake, oriented, and patient cooperative  Airway & Oxygen Therapy: Patient Spontanous Breathing and Patient connected to face mask oxygen  Post-op Assessment: Report given to RN and Post -op Vital signs reviewed and stable  Post vital signs: Reviewed and stable  Last Vitals:  Vitals Value Taken Time  BP 105/63 12/01/23 1000  Temp 36.7 C 12/01/23 0956  Pulse 88 12/01/23 1002  Resp 22 12/01/23 1002  SpO2 93 % 12/01/23 1002  Vitals shown include unfiled device data.  Last Pain:  Vitals:   12/01/23 0956  TempSrc: Temporal  PainSc:       Patients Stated Pain Goal: 3 (11/30/23 0250)  Complications: No notable events documented.

## 2023-12-01 NOTE — Plan of Care (Signed)
  Problem: Education: Goal: Knowledge of General Education information will improve Description: Including pain rating scale, medication(s)/side effects and non-pharmacologic comfort measures Outcome: Progressing   Problem: Health Behavior/Discharge Planning: Goal: Ability to manage health-related needs will improve Outcome: Progressing   Problem: Clinical Measurements: Goal: Ability to maintain clinical measurements within normal limits will improve Outcome: Progressing Goal: Will remain free from infection Outcome: Progressing Goal: Diagnostic test results will improve Outcome: Progressing Goal: Respiratory complications will improve Outcome: Progressing Goal: Cardiovascular complication will be avoided Outcome: Progressing   Problem: Activity: Goal: Risk for activity intolerance will decrease Outcome: Progressing   Problem: Nutrition: Goal: Adequate nutrition will be maintained Outcome: Progressing   Problem: Coping: Goal: Level of anxiety will decrease Outcome: Progressing   Problem: Elimination: Goal: Will not experience complications related to bowel motility Outcome: Progressing Goal: Will not experience complications related to urinary retention Outcome: Progressing   Problem: Pain Management: Goal: General experience of comfort will improve Outcome: Progressing   Problem: Safety: Goal: Ability to remain free from injury will improve Outcome: Progressing   Problem: Skin Integrity: Goal: Risk for impaired skin integrity will decrease Outcome: Progressing   Problem: Fluid Volume: Goal: Hemodynamic stability will improve Outcome: Progressing   Problem: Clinical Measurements: Goal: Diagnostic test results will improve Outcome: Progressing Goal: Signs and symptoms of infection will decrease Outcome: Progressing   Problem: Respiratory: Goal: Ability to maintain adequate ventilation will improve Outcome: Progressing   Problem: Education: Goal:  Knowledge of General Education information will improve Description: Including pain rating scale, medication(s)/side effects and non-pharmacologic comfort measures Outcome: Progressing   Problem: Health Behavior/Discharge Planning: Goal: Ability to manage health-related needs will improve Outcome: Progressing   Problem: Clinical Measurements: Goal: Ability to maintain clinical measurements within normal limits will improve Outcome: Progressing Goal: Will remain free from infection Outcome: Progressing Goal: Diagnostic test results will improve Outcome: Progressing Goal: Respiratory complications will improve Outcome: Progressing Goal: Cardiovascular complication will be avoided Outcome: Progressing   Problem: Activity: Goal: Risk for activity intolerance will decrease Outcome: Progressing   Problem: Nutrition: Goal: Adequate nutrition will be maintained Outcome: Progressing   Problem: Coping: Goal: Level of anxiety will decrease Outcome: Progressing   Problem: Elimination: Goal: Will not experience complications related to bowel motility Outcome: Progressing Goal: Will not experience complications related to urinary retention Outcome: Progressing   Problem: Pain Management: Goal: General experience of comfort will improve Outcome: Progressing   Problem: Safety: Goal: Ability to remain free from injury will improve Outcome: Progressing   Problem: Skin Integrity: Goal: Risk for impaired skin integrity will decrease Outcome: Progressing

## 2023-12-01 NOTE — Interval H&P Note (Signed)
History and Physical Interval Note: Patient in endoscopy for EGD this AM. Reports feeling okay this AM. EGD to evaluate for primary source of his malignancy. Heparin drip held for this exam. I have discussed risks / benefits, he understands and wants to proceed. Further recommendations pending the results.   12/01/2023 9:32 AM  Imagene Riches  has presented today for surgery, with the diagnosis of Anemia, rule out upper GI cancer.  The various methods of treatment have been discussed with the patient and family. After consideration of risks, benefits and other options for treatment, the patient has consented to  Procedure(s): ESOPHAGOGASTRODUODENOSCOPY (EGD) WITH PROPOFOL (N/A) as a surgical intervention.  The patient's history has been reviewed, patient examined, no change in status, stable for surgery.  I have reviewed the patient's chart and labs.  Questions were answered to the patient's satisfaction.     Viviann Spare P Zira Helinski

## 2023-12-01 NOTE — Progress Notes (Signed)
PHARMACY - ANTICOAGULATION CONSULT NOTE  Pharmacy Consult for Heparin Indication: DVT  Allergies  Allergen Reactions   Shellfish Allergy Swelling    Patient Measurements: Height: 5\' 10"  (177.8 cm) Weight: 97.2 kg (214 lb 4.6 oz) IBW/kg (Calculated) : 73 Heparin Dosing Weight: 91.3 kg  Vital Signs: Temp: 99.8 F (37.7 C) (12/07 2007) Temp Source: Oral (12/07 2007) BP: 126/62 (12/07 2007) Pulse Rate: 101 (12/07 2007)  Labs: Recent Labs    11/29/23 0659 11/29/23 0827 11/30/23 0528 11/30/23 0744 11/30/23 1817 12/01/23 0522 12/01/23 2105  HGB 8.4*  --  7.4*  --  8.2* 8.1*  --   HCT 26.1*  --  23.0*  --  25.5* 25.6*  --   PLT 257  --  256  --   --  302  --   HEPARINUNFRC 0.35  --   --    < > 0.24* 0.35 0.23*  CREATININE 0.97  --  0.97  --   --   --   --   CKTOTAL  --  529*  --   --   --   --   --    < > = values in this interval not displayed.    Estimated Creatinine Clearance: 79.3 mL/min (by C-G formula based on SCr of 0.97 mg/dL).  Assessment: Patient admitted following a fall and was found down after 48 hours. Vascular US showing age indeterminate DVT in LLE, chronic DVT in RLE, and acute DVT in RUE.  Pharmacy consulted to dose IV heparin.    Heparin now therapeutic on 1850 units/hr.  Heparin was held this AM for EGD to eval for malignancy.  No bleeding noted.  No biopsies taken.  Confirmed with Dr. Adela Lank, ok to resume heparin.    PM update: heparin level 0.23 (subtherapeutic) on heparin 1850 units/hr. No issues with the infusion or bleeding reported per RN.  Goal of Therapy:  Heparin level 0.3-0.7 units/ml Monitor platelets by anticoagulation protocol: Yes   Plan:  Increase heparin to 1950 units/hr. Check heparin level in 8 hours. Monitor for signs/symptoms of bleeding. Follow up oral anticoagulation plans.  Loralee Pacas, PharmD, BCPS Clinical Pharmacist  **Pharmacist phone directory can be found on [email or web address] listed under [facility].  12/01/2023 9:50 PM

## 2023-12-01 NOTE — Op Note (Signed)
[facility] Patient Name: [name] Procedure Date : 12/01/2023 MRN: [id number] Attending MD: Willaim Rayas. Adela Lank , MD, [id number] Date of Birth: September 20, 1951 CSN: [id number] Age: 72 Admit Type: Inpatient Procedure:                Upper GI endoscopy Indications:              Personal history of malignant neoplasm - metastatic                            adenocarcinoma of unclear primary source. EGD to                            rule out upper tract malignancy Providers:                Viviann Spare P. Adela Lank, MD, Eliberto Ivory, RN, Kandice Robinsons, Technician Referring MD:              Medicines:                Monitored Anesthesia Care Complications:            No immediate complications. Estimated blood loss:                            None. Estimated Blood Loss:     Estimated blood loss: none. Procedure:                Pre-Anesthesia Assessment:                           - Prior to the procedure, a History and Physical                            was performed, and patient medications and                            allergies were reviewed. The patient's tolerance of                            previous anesthesia was also reviewed. The risks                            and benefits of the procedure and the sedation                            options and risks were discussed with the patient.                            All questions were answered, and informed consent                            was obtained. Prior Anticoagulants: The patient has  taken heparin, last dose was day of procedure. ASA                            Grade Assessment: III - A patient with severe                            systemic disease. After reviewing the risks and                            benefits, the patient was deemed in satisfactory                            condition to undergo the procedure.                           After obtaining  informed consent, the endoscope was                            passed under direct vision. Throughout the                            procedure, the patient's blood pressure, pulse, and                            oxygen saturations were monitored continuously. The                            [id number]-[id number] ([id number]) Olympus endoscope was introduced                            through the mouth, and advanced to the second part                            of duodenum. The upper GI endoscopy was                            accomplished without difficulty. The patient                            tolerated the procedure well. Scope In: Scope Out: Findings:      Esophagogastric landmarks were identified: the Z-line was found at 40       cm, the gastroesophageal junction was found at 40 cm and the upper       extent of the gastric folds was found at 40 cm from the incisors.      The exam of the esophagus was otherwise normal.      Patchy mildly erythematous mucosa and a few small erosions were found in       the gastric fundus and in the gastric antrum.      The exam of the stomach was otherwise normal.      The ampulla and examined duodenum were normal. Impression:               - Esophagogastric landmarks identified.                           -  Normal esophagus otherwise.                           - Erythematous mucosa in the gastric fundus and                            antrum, a few small erosions.                           - Normal stomach otherwise.                           - Normal visualized ampulla and examined duodenum.                           No source for metastatic malignancy in the upper GI                            tract Recommendation:           - Return patient to hospital ward for ongoing care.                           - Advance diet as tolerated.                           - Continue present medications.                           - Management / workup of malignancy per Oncology                            - Resume outpatient protonix 40mg  / day to help                            prevent GI bleeding, etc, in the setting of acute                            illness and gastric erosions                           - We will sign off for now, call with questions                            moving forward Procedure Code(s):        --- Professional ---                           [phone], Esophagogastroduodenoscopy, flexible,                            transoral; diagnostic, including collection of                            specimen(s) by brushing or washing, when performed                            (  separate procedure) Diagnosis Code(s):        --- Professional ---                           K31.89, Other diseases of stomach and duodenum                           Z85.9, Personal history of malignant neoplasm,                            unspecified CPT copyright 2022 [employer]. All rights reserved. The codes documented in this report are preliminary and upon coder review may  be revised to meet current compliance requirements. Viviann Spare P. Ruweyda Macknight, MD 12/01/2023 9:56:17 AM This report has been signed electronically. Number of Addenda: 0

## 2023-12-01 NOTE — Anesthesia Preprocedure Evaluation (Addendum)
Anesthesia Evaluation  Patient identified by MRN, date of birth, ID band Patient awake    Reviewed: Allergy & Precautions, NPO status , Patient's Chart, lab work & pertinent test results, reviewed documented beta blocker date and time   History of Anesthesia Complications Negative for: history of anesthetic complications  Airway Mallampati: II  TM Distance: >3 FB Neck ROM: Full    Dental  (+) Dental Advisory Given, Edentulous Upper, Partial Lower   Pulmonary former smoker   Pulmonary exam normal        Cardiovascular hypertension, Pt. on medications and Pt. on home beta blockers + Peripheral Vascular Disease  Normal cardiovascular exam     Neuro/Psych negative neurological ROS  negative psych ROS   GI/Hepatic Neg liver ROS,GERD  Medicated and Controlled,,  Endo/Other   Obesity   Renal/GU CRFRenal disease    Prostate cancer     Musculoskeletal negative musculoskeletal ROS (+)    Abdominal   Peds  Hematology  (+) Blood dyscrasia, anemia   Anesthesia Other Findings   Reproductive/Obstetrics                             Anesthesia Physical Anesthesia Plan  ASA: 3  Anesthesia Plan: MAC   Post-op Pain Management: Minimal or no pain anticipated   Induction:   PONV Risk Score and Plan: 1 and Propofol infusion and Treatment may vary due to age or medical condition  Airway Management Planned: Nasal Cannula and Natural Airway  Additional Equipment: None  Intra-op Plan:   Post-operative Plan:   Informed Consent: I have reviewed the patients History and Physical, chart, labs and discussed the procedure including the risks, benefits and alternatives for the proposed anesthesia with the patient or authorized representative who has indicated his/her understanding and acceptance.       Plan Discussed with: CRNA and Anesthesiologist  Anesthesia Plan Comments:         Anesthesia Quick Evaluation

## 2023-12-02 DIAGNOSIS — E440 Moderate protein-calorie malnutrition: Secondary | ICD-10-CM | POA: Diagnosis not present

## 2023-12-02 DIAGNOSIS — C799 Secondary malignant neoplasm of unspecified site: Secondary | ICD-10-CM | POA: Diagnosis not present

## 2023-12-02 DIAGNOSIS — R571 Hypovolemic shock: Secondary | ICD-10-CM | POA: Diagnosis not present

## 2023-12-02 DIAGNOSIS — N179 Acute kidney failure, unspecified: Secondary | ICD-10-CM | POA: Diagnosis not present

## 2023-12-02 LAB — CBC
HCT: 25.1 % — ABNORMAL LOW (ref 39.0–52.0)
Hemoglobin: 8 g/dL — ABNORMAL LOW (ref 13.0–17.0)
MCH: 28.6 pg (ref 26.0–34.0)
MCHC: 31.9 g/dL (ref 30.0–36.0)
MCV: 89.6 fL (ref 80.0–100.0)
Platelets: 293 10*3/uL (ref 150–400)
RBC: 2.8 MIL/uL — ABNORMAL LOW (ref 4.22–5.81)
RDW: 17.9 % — ABNORMAL HIGH (ref 11.5–15.5)
WBC: 12.6 10*3/uL — ABNORMAL HIGH (ref 4.0–10.5)
nRBC: 0 % (ref 0.0–0.2)

## 2023-12-02 LAB — HEPARIN LEVEL (UNFRACTIONATED): Heparin Unfractionated: 0.32 [IU]/mL (ref 0.30–0.70)

## 2023-12-02 MED ORDER — POLYETHYLENE GLYCOL 3350 17 G PO PACK
17.0000 g | PACK | Freq: Every day | ORAL | Status: DC
  Administered 2023-12-02 – 2023-12-03 (×2): 17 g via ORAL
  Filled 2023-12-02 (×2): qty 1

## 2023-12-02 NOTE — Progress Notes (Signed)
PROGRESS NOTE    BANYAN TRACHT  JYN:[id number] DOB: 21-Feb-1951 DOA: 11/26/2023 PCP: [facility], FNP   Brief Narrative: [name] is a 72 y.o. male with a history of hypertension, hyperlipidemia, PAD status post right AKA, CKD stage IIIa, GERD, prostate cancer status post prostatectomy.  Patient presented secondary to being found down on the ground and altered and was found to have evidence of hypotension concerning for septic shock with additional evidence of rhabdomyolysis.  Patient started empirically on antibiotics, started on vasopressor support, and IV fluids and was transferred to [facility] from [facility] for ICU admission.  Antibiotics were discontinued secondary to unlikely infectious etiology.  Hypotension and rhabdomyolysis improved with IV fluids and vasopressor support was discontinued.  Admission complicated by the identification of metastatic disease of unknown primary   Assessment and Plan:  Hypovolemic shock Sepsis ruled out. Patient managed in ICU with norepinephrine and fluid resuscitation.   Traumatic rhabdomyolysis Secondary to being down. CK of 6,948 on admission. Associated elevated AST/ALT. Improvement with IV fluids.  Acute anemia Anemia of chronic disease Baseline hemoglobin of 8-9 d/dL. During this admission, hemoglobin drifted down to a low of 6.7 g/dL requiring a transfusion of 1 unit of PRBC on 12/4. Post-transfusion hemoglobin of 8.5 g/dL. Hemoglobin drifted down to 8 g/dL but appears stable.  AKI Baseline creatinine appears to be around 1.3; unclear if patient has CKD stage IIIa vs CKD stage II. Creatinine of 2.21 on admission with improvement to 0.97 prior to discharge.  GERD Continue Protonix.  Metastatic disease Unknown primary. Metastatic disease noted of right  adrenal gland in addition to bony metastasis noted at T5, T6 and T10 with associated pathologic fractures, in addition to the anterior right  fifth rib. Medical oncology consulted. Enlarged right cervical lymph node noted in addition to hilar mass/adenopathy with mediastinal, axillary and right supraclavicular adenopathy on CT. Bedside US guided biopsy of left anterior abdominal wall mass performed on 12/4 which is positive for adenocarcinoma. Upper endoscopy negative for upper GI malignancy.  Central canal narrowing Noted on CT imaging at T10 secondary to extension of metastatic disease. Severe spinal stenosis at T1 and mild stenosis at T10 noted on MRI . No cord compression noted on MRI.  Primary hypertension Antihypertensives held secondary to hypovolemic shock.  Hyperlipidemia Lipitor held.  PAD History of right AKA. -Continue aspirin.  Age indeterminate left lower extremity DVT Chronic right lower extremity DVT Indeterminate DVT involving the left femoral vein, left popliteal vein, left posterior tibial veins and left peroneal veins. Chronic DVT involving the right femoral vein. In setting of presumed metastatic cancer. Patient started on heparin IV. -Continue heparin IV; transition to Eliquis once no more procedures are expected  Acute right upper extremity DVT Acute right upper extremity SVT Acute DVT involving the right subclavian vein, right axillary vein and right brachial veins. Acute SVT involving the right basilic vein and right cephalic vein  Scrotal cellulitis/candidiasis MRI without deep infection, abscess, or features necrotizing disease. Infection appears to be improving. Blood cultures (12/6) are no growth to date. -Continue Vancomycin, Cefepime, Nystatin  Sepsis Unclear is present on admission, but recurrent criteria met during hospitalization secondary to scrotal infection. Blood cultures obtained. Empiric vancomycin and Cefepime started for treatment. Blood cultures (12/6) are no growth to date.  Phantom limb pain Noted.  Moderate malnutrition Dietitian recommendations (12/5): Ensure Plus High  Protein po BID, each supplement provides 350 kcal and 20 grams of protein. Multivitamin with  minerals   Pressure injury Distal left/medial pretibial area. Present on admission.   DVT prophylaxis: Heparin IV Code Status:   Code Status: Full Code Family Communication: None at bedside Disposition Plan: Discharge to SNF pending ongoing workup for metastatic cancer/oncology recommendations and transition to outpatient antibiotics.   Consultants:  PCCM Medical oncology Interventional radiology  Procedures:  12/4: Bedside US guided biopsy of left ant abd wall mass  12/7: Upper GI endoscopy  Antimicrobials: Zosyn Vancomycin Cefepime    Subjective: No recent bowel movement. Some mild abdominal pain.  Objective: BP 123/68 (BP Location: Right Arm)   Pulse 92   Temp 98.5 F (36.9 C) (Oral)   Resp 18   Ht 5\' 10"  (1.778 m)   Wt 96 kg   SpO2 93%   BMI 30.37 kg/m   Examination:  General exam: Appears calm and comfortable  Respiratory system: Clear to auscultation. Respiratory effort normal. Cardiovascular system: S1 & S2 heard, RRR. Gastrointestinal system: Abdomen is nondistended, soft and tender over biopsy site. Normal bowel sounds heard. Central nervous system: Alert and oriented. No focal neurological deficits. Musculoskeletal: Right AKA Psychiatry: Judgement and insight appear normal. Mood & affect appropriate.    Data Reviewed: I have personally reviewed following labs and imaging studies  CBC Lab Results  Component Value Date   WBC 12.6 (H) 12/02/2023   RBC 2.80 (L) 12/02/2023   HGB 8.0 (L) 12/02/2023   HCT 25.1 (L) 12/02/2023   MCV 89.6 12/02/2023   MCH 28.6 12/02/2023   PLT 293 12/02/2023   MCHC 31.9 12/02/2023   RDW 17.9 (H) 12/02/2023   LYMPHSABS 2.2 11/30/2023   MONOABS 0.9 11/30/2023   EOSABS 0.1 11/30/2023   BASOSABS 0.0 11/30/2023     Last metabolic panel Lab Results  Component Value Date   NA 133 (L) 11/30/2023   K 3.9 11/30/2023   CL  104 11/30/2023   CO2 22 11/30/2023   BUN 24 (H) 11/30/2023   CREATININE 0.97 11/30/2023   GLUCOSE 128 (H) 11/30/2023   GFRNONAA >60 11/30/2023   GFRAA (L) 03/18/2010    60        The eGFR has been calculated using the MDRD equation. This calculation has not been validated in all clinical situations. eGFR's persistently <60 mL/min signify possible Chronic Kidney Disease.   CALCIUM 9.9 11/30/2023   PHOS 3.2 11/27/2023   PROT 4.7 (L) 11/30/2023   ALBUMIN <1.5 (L) 11/30/2023   LABGLOB 4.2 (H) 10/23/2023   AGRATIO 0.6 (L) 10/23/2023   BILITOT 0.4 11/30/2023   ALKPHOS 72 11/30/2023   AST 58 (H) 11/30/2023   ALT 51 (H) 11/30/2023   ANIONGAP 7 11/30/2023    GFR: Estimated Creatinine Clearance: 78.9 mL/min (by C-G formula based on SCr of 0.97 mg/dL).  Recent Results (from the past 240 hour(s))  Blood culture (routine x 2)     Status: None   Collection Time: 11/26/23 12:13 PM   Specimen: BLOOD  Result Value Ref Range Status   Specimen Description BLOOD BLOOD RIGHT ARM  Final   Special Requests   Final    BOTTLES DRAWN AEROBIC AND ANAEROBIC Blood Culture results may not be optimal due to an inadequate volume of blood received in culture bottles   Culture   Final    NO GROWTH 5 DAYS Performed at [facility], [address], [place], [place] [address]    Report Status 12/01/2023 FINAL  Final  Blood culture (routine x 2)     Status: None  Collection Time: 11/26/23 12:20 PM   Specimen: BLOOD  Result Value Ref Range Status   Specimen Description BLOOD BLOOD RIGHT HAND AEROBIC BOTTLE ONLY  Final   Special Requests   Final    Blood Culture results may not be optimal due to an inadequate volume of blood received in culture bottles BOTTLES DRAWN AEROBIC ONLY   Culture   Final    NO GROWTH 5 DAYS Performed at [facility], [address] [place], [place] [address]    Report Status 12/01/2023 FINAL  Final  MRSA Next Gen by PCR, Nasal     Status: None   Collection Time: 11/26/23   9:02 PM   Specimen: Nasal Mucosa; Nasal Swab  Result Value Ref Range Status   MRSA by PCR Next Gen NOT DETECTED NOT DETECTED Final    Comment: (NOTE) The GeneXpert MRSA Assay (FDA approved for NASAL specimens only), is one component of a comprehensive MRSA colonization surveillance program. It is not intended to diagnose MRSA infection nor to guide or monitor treatment for MRSA infections. Test performance is not FDA approved in patients less than 19 years old. Performed at [facility], [address], [place], [place] [address]   Culture, blood (Routine X 2) w Reflex to ID Panel     Status: None (Preliminary result)   Collection Time: 11/30/23  5:25 AM   Specimen: BLOOD RIGHT ARM  Result Value Ref Range Status   Specimen Description BLOOD RIGHT ARM  Final   Special Requests   Final    BOTTLES DRAWN AEROBIC AND ANAEROBIC Blood Culture results may not be optimal due to an inadequate volume of blood received in culture bottles   Culture   Final    NO GROWTH 2 DAYS Performed at [facility], [address], [place], [place] [address]    Report Status PENDING  Incomplete  Culture, blood (Routine X 2) w Reflex to ID Panel     Status: None (Preliminary result)   Collection Time: 11/30/23  5:26 AM   Specimen: BLOOD RIGHT ARM  Result Value Ref Range Status   Specimen Description BLOOD RIGHT ARM  Final   Special Requests   Final    BOTTLES DRAWN AEROBIC AND ANAEROBIC Blood Culture results may not be optimal due to an inadequate volume of blood received in culture bottles   Culture   Final    NO GROWTH 2 DAYS Performed at [facility], [address] [place], [place] [address]    Report Status PENDING  Incomplete      Radiology Studies: MR PELVIS W WO CONTRAST  Result Date: 11/30/2023 CLINICAL DATA:  Soft tissue infection suspected evaluate for abscess and gas formation underneath the skin around the groin, penile, and scrotal area, clinical concern for cellulitis  and candidiasis. Metastatic malignancy with soft tissue metastases in the pelvis. EXAM: MRI PELVIS WITHOUT AND WITH CONTRAST TECHNIQUE: Multiplanar multisequence MR imaging of the pelvis was performed both before and after administration of intravenous contrast. CONTRAST:  9mL GADAVIST GADOBUTROL 1 MMOL/ML IV SOLN COMPARISON:  CT abdomen pelvis, 11/26/2023 FINDINGS: Urinary Tract:  No abnormality visualized. Bowel:  Unremarkable visualized pelvic bowel loops. Vascular/Lymphatic: Multiple enlarged bilateral inguinal lymph nodes left inguinal nodes measuring up to 2.0 x 1.3 cm (series 13, image 15). No significant vascular abnormality seen. Reproductive: Status post prostatectomy. Soft tissue edema of the scrotum and penis. No focal fluid collection. Other: Diffuse anasarca. Multiple enhancing subcutaneous soft tissue nodules, including  within the pubic subcutaneous fat measuring 3.3 x 2.8 cm (series 13, image 18), in the subcutaneous fat of the anterior left thigh measuring 2.1 x 1.9 cm (series 13, image 8), overlying the left hamstring musculature measuring 2.6 x 2.0 cm (series 13, image 56), and in the right biceps femoris muscle body measuring 2.6 x 2.5 cm (series 13, image 66). Musculoskeletal: Multiple small enhancing osseous metastases, including of the greater trochanter of the left femur (series 13, image 6), and of the left ischial ramus (series 13, image 21). IMPRESSION: 1. Soft tissue edema of the scrotum and penis, consistent with cellulitis. No focal fluid collection or gas to suggest abscess or necrotizing fasciitis. 2. Multiple enhancing subcutaneous and intramuscular metastatic soft tissue nodules throughout the pelvis and included proximal femurs. 3. Multiple enlarged bilateral inguinal lymph nodes, possibly reactive although highly suspicious for nodal metastases. 4. Multiple small enhancing osseous metastases. 5. Status post prostatectomy. Electronically Signed   By: Jearld Lesch M.D.   On:  11/30/2023 15:32      LOS: 6 days    Jacquelin Hawking, MD [facility] 12/02/2023, 12:26 PM   If 7PM-7AM, please contact night-coverage [email or web address]

## 2023-12-02 NOTE — Consult Note (Signed)
WOC Nurse Consult Note: this consult performed remotely utilizing EMR documentation including photo  Reason for Consult:blisters R forearm  Wound type: partial thickness, intact; appears to have 2 areas of full thickness perhaps blisters that have opened  Pressure Injury POA: NA  Measurement: per nursing flowsheet  Wound bed: intact serum filled blister, lateral to this area 1 partially opened blister revealing pink moist tissue; 1 scabbed area  Drainage (amount, consistency, odor) see nursing flowsheet  Periwound: intact  Dressing procedure/placement/frequency:Cover blisters R forearm with a single layer of Xeroform gauze daily [facility] [phone]).  Cover with silicone foam or dry gauze and Kerlix roll gauze whichever is preferred.   If blisters open and drain may continue to cover with Xeroform gauze.   POC discussed with bedside nurse. WOC team will not follow Re-consult if further needs arise.   Thank you,    Priscella Mann MSN, RN-BC, [employer] [phone]

## 2023-12-02 NOTE — Progress Notes (Signed)
PHARMACY - ANTICOAGULATION CONSULT NOTE  Pharmacy Consult for Heparin Indication: DVT  Allergies  Allergen Reactions   Shellfish Allergy Swelling    Patient Measurements: Height: 5\' 10"  (177.8 cm) Weight: 96 kg (211 lb 10.3 oz) IBW/kg (Calculated) : 73 Heparin Dosing Weight: 91.3 kg  Vital Signs: Temp: 98.5 F (36.9 C) (12/08 0534) Temp Source: Oral (12/08 0534) BP: 123/68 (12/08 0825) Pulse Rate: 92 (12/08 0825)  Labs: Recent Labs    11/30/23 0528 11/30/23 0744 11/30/23 1817 12/01/23 0522 12/01/23 2105 12/02/23 0703  HGB 7.4*  --  8.2* 8.1*  --  8.0*  HCT 23.0*  --  25.5* 25.6*  --  25.1*  PLT 256  --   --  302  --  293  HEPARINUNFRC  --    < > 0.24* 0.35 0.23* 0.32  CREATININE 0.97  --   --   --   --   --    < > = values in this interval not displayed.    Estimated Creatinine Clearance: 78.9 mL/min (by C-G formula based on SCr of 0.97 mg/dL).  Assessment: Patient admitted following a fall and was found down after 48 hours. Vascular US showing age indeterminate DVT in LLE, chronic DVT in RLE, and acute DVT in RUE.  Pharmacy consulted to dose IV heparin.    Heparin at low end of therapeutic goal on 1950 units/hr.  No bleeding noted.  Will increase heparin rate slightly to maintain goal.  Goal of Therapy:  Heparin level 0.3-0.7 units/ml Monitor platelets by anticoagulation protocol: Yes   Plan:  Increase heparin to 2050 units/hr. Daily heparin level and CBC Monitor for signs/symptoms of bleeding. Follow up oral anticoagulation plans.   [employer], Pharm.D., BCPS Clinical Pharmacist  **Pharmacist phone directory can be found on [email or web address] listed under [facility].  12/02/2023 10:11 AM

## 2023-12-03 ENCOUNTER — Encounter (HOSPITAL_COMMUNITY): Payer: Self-pay | Admitting: Gastroenterology

## 2023-12-03 DIAGNOSIS — E440 Moderate protein-calorie malnutrition: Secondary | ICD-10-CM | POA: Diagnosis not present

## 2023-12-03 DIAGNOSIS — N179 Acute kidney failure, unspecified: Secondary | ICD-10-CM | POA: Diagnosis not present

## 2023-12-03 DIAGNOSIS — C799 Secondary malignant neoplasm of unspecified site: Secondary | ICD-10-CM | POA: Diagnosis not present

## 2023-12-03 DIAGNOSIS — R571 Hypovolemic shock: Secondary | ICD-10-CM | POA: Diagnosis not present

## 2023-12-03 LAB — BASIC METABOLIC PANEL
Anion gap: 7 (ref 5–15)
BUN: 21 mg/dL (ref 8–23)
CO2: 20 mmol/L — ABNORMAL LOW (ref 22–32)
Calcium: 10.7 mg/dL — ABNORMAL HIGH (ref 8.9–10.3)
Chloride: 103 mmol/L (ref 98–111)
Creatinine, Ser: 0.82 mg/dL (ref 0.61–1.24)
GFR, Estimated: >60 mL/min (ref >60)
Glucose, Bld: 110 mg/dL — ABNORMAL HIGH (ref 70–99)
Potassium: 4.4 mmol/L (ref 3.5–5.1)
Sodium: 130 mmol/L — ABNORMAL LOW (ref 135–145)

## 2023-12-03 LAB — HEPARIN LEVEL (UNFRACTIONATED): Heparin Unfractionated: 0.45 [IU]/mL (ref 0.30–0.70)

## 2023-12-03 LAB — ALBUMIN: Albumin: 1.5 g/dL — ABNORMAL LOW (ref 3.5–5.0)

## 2023-12-03 MED ORDER — AMOXICILLIN-POT CLAVULANATE 875-125 MG PO TABS
1.0000 | ORAL_TABLET | Freq: Two times a day (BID) | ORAL | Status: AC
  Administered 2023-12-03 – 2023-12-06 (×8): 1 via ORAL
  Filled 2023-12-03 (×8): qty 1

## 2023-12-03 MED ORDER — APIXABAN 5 MG PO TABS
5.0000 mg | ORAL_TABLET | Freq: Two times a day (BID) | ORAL | Status: DC
  Administered 2023-12-10 – 2023-12-11 (×3): 5 mg via ORAL
  Filled 2023-12-03 (×3): qty 1

## 2023-12-03 MED ORDER — DOXYCYCLINE HYCLATE 100 MG PO TABS
100.0000 mg | ORAL_TABLET | Freq: Two times a day (BID) | ORAL | Status: AC
  Administered 2023-12-03 – 2023-12-06 (×8): 100 mg via ORAL
  Filled 2023-12-03 (×8): qty 1

## 2023-12-03 MED ORDER — APIXABAN 5 MG PO TABS
10.0000 mg | ORAL_TABLET | Freq: Two times a day (BID) | ORAL | Status: AC
  Administered 2023-12-03 – 2023-12-09 (×14): 10 mg via ORAL
  Filled 2023-12-03 (×15): qty 2

## 2023-12-03 NOTE — Progress Notes (Signed)
PROGRESS NOTE    [name]  NWG:[id number] DOB: [birth year] DOA: 11/26/2023 PCP: [facility] Records, FNP   Brief Narrative: [name] is a 72 y.o. male with a history of hypertension, hyperlipidemia, PAD status post right AKA, CKD stage IIIa, GERD, prostate cancer status post prostatectomy.  Patient presented secondary to being found down on the ground and altered and was found to have evidence of hypotension concerning for septic shock with additional evidence of rhabdomyolysis.  Patient started empirically on antibiotics, started on vasopressor support, and IV fluids and was transferred to [facility] from [facility] for ICU admission.  Antibiotics were discontinued secondary to unlikely infectious etiology.  Hypotension and rhabdomyolysis improved with IV fluids and vasopressor support was discontinued.  Admission complicated by the identification of metastatic disease of unknown primary   Assessment and Plan:  Hypovolemic shock Sepsis ruled out. Patient managed in ICU with norepinephrine and fluid resuscitation.   Traumatic rhabdomyolysis Secondary to being down. CK of 6,948 on admission. Associated elevated AST/ALT. Improvement with IV fluids.  Acute anemia Anemia of chronic disease Baseline hemoglobin of 8-9 d/dL. During this admission, hemoglobin drifted down to a low of 6.7 g/dL requiring a transfusion of 1 unit of PRBC on 12/4. Post-transfusion hemoglobin of 8.5 g/dL. Hemoglobin drifted down to 8 g/dL but appears stable.  AKI Baseline creatinine appears to be around 1.3; unclear if patient has CKD stage IIIa vs CKD stage II. Creatinine of 2.21 on admission with improvement to 0.97 prior to discharge.  GERD Continue Protonix.  Metastatic disease Unknown primary. Metastatic disease noted of right  adrenal gland in addition to bony metastasis noted at T5, T6 and T10 with associated pathologic fractures, in addition to the anterior right  fifth rib. Medical oncology consulted. Enlarged right cervical lymph node noted in addition to hilar mass/adenopathy with mediastinal, axillary and right supraclavicular adenopathy on CT. Bedside US guided biopsy of left anterior abdominal wall mass performed on 12/4 which is positive for adenocarcinoma. Upper endoscopy negative for upper GI malignancy.  Central canal narrowing Noted on CT imaging at T10 secondary to extension of metastatic disease. Severe spinal stenosis at T1 and mild stenosis at T10 noted on MRI . No cord compression noted on MRI.  Primary hypertension Antihypertensives held secondary to hypovolemic shock.  Hyperlipidemia Lipitor held.  Hypercalcemia Calcium up to 10.7 which may be underrepresented in setting of low albumin -Check albumin level -May need to treat hypercalcemia with normal saline  PAD History of right AKA. -Continue aspirin.  Age indeterminate left lower extremity DVT Chronic right lower extremity DVT Indeterminate DVT involving the left femoral vein, left popliteal vein, left posterior tibial veins and left peroneal veins. Chronic DVT involving the right femoral vein. In setting of presumed metastatic cancer. Patient started on heparin IV. -Discontinue Heparin IV and start Eliquis  Acute right upper extremity DVT Acute right upper extremity SVT Acute DVT involving the right subclavian vein, right axillary vein and right brachial veins. Acute SVT involving the right basilic vein and right cephalic vein -Discontinue Heparin IV and start Eliquis  Scrotal cellulitis/candidiasis MRI without deep infection, abscess, or features necrotizing disease. Infection appears to be improving. Blood cultures (12/6) are no growth to date. -Discontinue Vancomycin and Cefepime and transition to Doxycycline and Augmentin with plan to treat for a 7 day course. -Continue Nystatin  Sepsis Unclear is present on admission, but recurrent criteria met during  hospitalization secondary to scrotal infection. Blood cultures obtained. Empiric  vancomycin and Cefepime started for treatment. Blood cultures (12/6) are no growth to date.  Phantom limb pain Noted.  Moderate malnutrition Dietitian recommendations (12/5): Ensure Plus High Protein po BID, each supplement provides 350 kcal and 20 grams of protein. Multivitamin with minerals   Pressure injury Distal left/medial pretibial area. Present on admission.   DVT prophylaxis: Heparin IV Code Status:   Code Status: Full Code Family Communication: None at bedside. Called sister via telephone, but no response (12/9) Disposition Plan: Discharge to SNF pending bed availability and pending workup for hypercalcemia.   Consultants:  PCCM Medical oncology Interventional radiology Gastroenterology  Procedures:  12/4: Bedside US guided biopsy of left ant abd wall mass  12/7: Upper GI endoscopy  Antimicrobials: Zosyn Vancomycin Cefepime    Subjective: No issues noted from overnight events  Objective: BP (!) 121/57 (BP Location: Left Arm)   Pulse 96   Temp 98.5 F (36.9 C) (Oral)   Resp 18   Ht 5\' 10"  (1.778 m)   Wt 94.6 kg   SpO2 94%   BMI 29.92 kg/m   Examination:  General exam: Appears calm and comfortable Respiratory system: Respiratory effort normal.   Data Reviewed: I have personally reviewed following labs and imaging studies  CBC Lab Results  Component Value Date   WBC 12.6 (H) 12/02/2023   RBC 2.80 (L) 12/02/2023   HGB 8.0 (L) 12/02/2023   HCT 25.1 (L) 12/02/2023   MCV 89.6 12/02/2023   MCH 28.6 12/02/2023   PLT 293 12/02/2023   MCHC 31.9 12/02/2023   RDW 17.9 (H) 12/02/2023   LYMPHSABS 2.2 11/30/2023   MONOABS 0.9 11/30/2023   EOSABS 0.1 11/30/2023   BASOSABS 0.0 11/30/2023     Last metabolic panel Lab Results  Component Value Date   NA 130 (L) 12/03/2023   K 4.4 12/03/2023   CL 103 12/03/2023   CO2 20 (L) 12/03/2023   BUN 21 12/03/2023    CREATININE 0.82 12/03/2023   GLUCOSE 110 (H) 12/03/2023   GFRNONAA >60 12/03/2023   GFRAA (L) 03/18/2010    60        The eGFR has been calculated using the MDRD equation. This calculation has not been validated in all clinical situations. eGFR's persistently <60 mL/min signify possible Chronic Kidney Disease.   CALCIUM 10.7 (H) 12/03/2023   PHOS 3.2 11/27/2023   PROT 4.7 (L) 11/30/2023   ALBUMIN <1.5 (L) 11/30/2023   LABGLOB 4.2 (H) 10/23/2023   AGRATIO 0.6 (L) 10/23/2023   BILITOT 0.4 11/30/2023   ALKPHOS 72 11/30/2023   AST 58 (H) 11/30/2023   ALT 51 (H) 11/30/2023   ANIONGAP 7 12/03/2023    GFR: Estimated Creatinine Clearance: 92.6 mL/min (by C-G formula based on SCr of 0.82 mg/dL).  Recent Results (from the past 240 hour(s))  Blood culture (routine x 2)     Status: None   Collection Time: 11/26/23 12:13 PM   Specimen: BLOOD  Result Value Ref Range Status   Specimen Description BLOOD BLOOD RIGHT ARM  Final   Special Requests   Final    BOTTLES DRAWN AEROBIC AND ANAEROBIC Blood Culture results may not be optimal due to an inadequate volume of blood received in culture bottles   Culture   Final    NO GROWTH 5 DAYS Performed at [facility], [address] [place], [place] [address]    Report Status 12/01/2023 FINAL  Final  Blood culture (routine x 2)     Status: None   Collection  Time: 11/26/23 12:20 PM   Specimen: BLOOD  Result Value Ref Range Status   Specimen Description BLOOD BLOOD RIGHT HAND AEROBIC BOTTLE ONLY  Final   Special Requests   Final    Blood Culture results may not be optimal due to an inadequate volume of blood received in culture bottles BOTTLES DRAWN AEROBIC ONLY   Culture   Final    NO GROWTH 5 DAYS Performed at [facility], [address], [place], [place] [address]    Report Status 12/01/2023 FINAL  Final  MRSA Next Gen by PCR, Nasal     Status: None   Collection Time: 11/26/23  9:02 PM   Specimen: Nasal Mucosa; Nasal Swab  Result  Value Ref Range Status   MRSA by PCR Next Gen NOT DETECTED NOT DETECTED Final    Comment: (NOTE) The GeneXpert MRSA Assay (FDA approved for NASAL specimens only), is one component of a comprehensive MRSA colonization surveillance program. It is not intended to diagnose MRSA infection nor to guide or monitor treatment for MRSA infections. Test performance is not FDA approved in patients less than 90 years old. Performed at [facility], [address], [place], [place] [address]   Culture, blood (Routine X 2) w Reflex to ID Panel     Status: None (Preliminary result)   Collection Time: 11/30/23  5:25 AM   Specimen: BLOOD RIGHT ARM  Result Value Ref Range Status   Specimen Description BLOOD RIGHT ARM  Final   Special Requests   Final    BOTTLES DRAWN AEROBIC AND ANAEROBIC Blood Culture results may not be optimal due to an inadequate volume of blood received in culture bottles   Culture   Final    NO GROWTH 3 DAYS Performed at [facility], [address], [place], [place] [address]    Report Status PENDING  Incomplete  Culture, blood (Routine X 2) w Reflex to ID Panel     Status: None (Preliminary result)   Collection Time: 11/30/23  5:26 AM   Specimen: BLOOD RIGHT ARM  Result Value Ref Range Status   Specimen Description BLOOD RIGHT ARM  Final   Special Requests   Final    BOTTLES DRAWN AEROBIC AND ANAEROBIC Blood Culture results may not be optimal due to an inadequate volume of blood received in culture bottles   Culture   Final    NO GROWTH 3 DAYS Performed at [facility], [address] [place], [place] [address]    Report Status PENDING  Incomplete      Radiology Studies: No results found.    LOS: 7 days    Jacquelin Hawking, MD [facility] 12/03/2023, 11:54 AM   If 7PM-7AM, please contact night-coverage [email or web address]

## 2023-12-03 NOTE — Progress Notes (Signed)
Physical Therapy Treatment Patient Details Name: [name] MRN: [id number] DOB: 1951/07/26 Today's Date: 12/03/2023   History of Present Illness 72 y.o. male admitted 11/26/23 found down with 2-3 day h/o weakness and fall. Workup for rhabdomyolysis, AKI, septic shock. Imaging with L scalp hematoma. CT findings consistent with metastatic disease (multiple subQ masses, bilateral adrenal masses, R RP masses and multiple osseous metastases) with largest osseous metastasis of T10 vertebral body with cord compression S/p biopsy L abdominal wall mass 12/4. PMH includes prostate CA, PAD, HTN, HLD, R AKA (03/09/23).    PT Comments  Pt received in supine and agreeable to session. Pt demonstrates slow initiation and problem solving throughout session requiring increased cues and assist. Pt able to sit to EOB, however requires frequent assist to maintain sitting balance due to posterior and R lateral leans. Focus on pt improving BUE support and performing anterior weight shifts for sitting balance. Pt demonstrates limited sitting tolerance due to back pain and requests to return to supine. Pt continues to benefit from PT services to progress toward functional mobility goals.     If plan is discharge home, recommend the following: Two people to help with walking and/or transfers;Two people to help with bathing/dressing/bathroom   Can travel by private vehicle     No  Equipment Recommendations  Other (comment) (TBD)    Recommendations for Other Services       Precautions / Restrictions Precautions Precautions: Fall;Other (comment) Precaution Comments: h/o R AKA 02/2023 (has prosthetic LE at home); spinal precautions for comfort with mets to spine     Mobility  Bed Mobility Overal bed mobility: Needs Assistance Bed Mobility: Supine to Sit, Sit to Supine     Supine to sit: Mod assist, +2 for physical assistance Sit to supine: Max assist, +2 for physical assistance   General bed mobility  comments: Pt able to advance BLE to EOB with min A for LLE and use bedrail, however requires mod A +2 for trunk elevation and scooting forward to EOB. Max A +2 for LLE elevation and trunk descent to return to supine. max A +2 to supine scoot towards [facility]    Transfers                   General transfer comment: deferred due to poor sitting balance       Balance Overall balance assessment: Needs assistance Sitting-balance support: Bilateral upper extremity supported, Feet supported Sitting balance-Leahy Scale: Poor Sitting balance - Comments: pt requires frequent mod A to prevent posterior and R lateral leans                                    Cognition Arousal: Alert Behavior During Therapy: WFL for tasks assessed/performed Overall Cognitive Status: No family/caregiver present to determine baseline cognitive functioning                                 General Comments: increased cues intermittently and slow speech and initiation        Exercises      General Comments        Pertinent Vitals/Pain Pain Assessment Pain Assessment: Faces Faces Pain Scale: Hurts whole lot Pain Location: back Pain Descriptors / Indicators: Aching, Discomfort, Grimacing Pain Intervention(s): Limited activity within patient's tolerance, Monitored during session, Repositioned     PT Goals (current goals  can now be found in the care plan section) Acute Rehab PT Goals Patient Stated Goal: willing to consider post-acute rehab at SNF PT Goal Formulation: With patient Time For Goal Achievement: 12/13/23 Progress towards PT goals: Progressing toward goals    Frequency    Min 1X/week       AM-PAC PT "6 Clicks" Mobility   Outcome Measure  Help needed turning from your back to your side while in a flat bed without using bedrails?: A Lot Help needed moving from lying on your back to sitting on the side of a flat bed without using bedrails?: Total Help  needed moving to and from a bed to a chair (including a wheelchair)?: Total Help needed standing up from a chair using your arms (e.g., wheelchair or bedside chair)?: Total Help needed to walk in hospital room?: Total Help needed climbing 3-5 steps with a railing? : Total 6 Click Score: 7    End of Session   Activity Tolerance: Patient limited by fatigue;Patient limited by pain Patient left: in bed;with call bell/phone within reach;with bed alarm set Nurse Communication: Mobility status PT Visit Diagnosis: Other abnormalities of gait and mobility (R26.89);Muscle weakness (generalized) (M62.81);Pain     Time: 8119-1478 PT Time Calculation (min) (ACUTE ONLY): 27 min  Charges:    $Therapeutic Activity: 23-37 mins PT General Charges $$ ACUTE PT VISIT: 1 Visit                     Johny Shock, PTA Acute Rehabilitation Services Secure Chat Preferred  [phone]    Johny Shock 12/03/2023, 3:52 PM

## 2023-12-03 NOTE — Progress Notes (Addendum)
PHARMACY - ANTICOAGULATION CONSULT NOTE  Pharmacy Consult for Heparin transition to Eliquis  Indication: DVT  Allergies  Allergen Reactions   Shellfish Allergy Swelling    Patient Measurements: Height: 5\' 10"  (177.8 cm) Weight: 94.6 kg (208 lb 8.9 oz) IBW/kg (Calculated) : 73 Heparin Dosing Weight: 91.3 kg  Vital Signs: Temp: 98.5 F (36.9 C) (12/09 0430) Temp Source: Oral (12/09 0430) BP: 123/54 (12/09 0430) Pulse Rate: 100 (12/09 0430)  Labs: Recent Labs    11/30/23 1817 12/01/23 0522 12/01/23 2105 12/02/23 0703 12/03/23 0702  HGB 8.2* 8.1*  --  8.0*  --   HCT 25.5* 25.6*  --  25.1*  --   PLT  --  302  --  293  --   HEPARINUNFRC 0.24* 0.35 0.23* 0.32 0.45    Estimated Creatinine Clearance: 78.3 mL/min (by C-G formula based on SCr of 0.97 mg/dL).  Assessment: Patient admitted following a fall and was found down after 48 hours. Vascular US showing age indeterminate DVT in LLE, chronic DVT in RLE, and acute DVT in RUE.  Pharmacy consulted to dose IV heparin.    Heparin level therapeutic on 2050 units/hr.  Hgb remains low but stable, no overt s/sx of bleeding reported.   Goal of Therapy:  Heparin level 0.3-0.7 units/ml Monitor platelets by anticoagulation protocol: Yes   Plan:  Continue heparin at 2050 units/hr. Daily heparin level and CBC Monitor for signs/symptoms of bleeding. Follow up oral anticoagulation plans.  Ruben Im, PharmD Clinical Pharmacist 12/03/2023 7:35 AM Please check AMION for all [facility] Pharmacy numbers   _____________________________________________________________________ Addendum:  Discontinue heparin infusion and transition to Eliquis.  Eliquis 10mg  BID x7 days, then 5mg  BID  Monitor for s/sx of bleeding  Ruben Im, PharmD Clinical Pharmacist 12/03/2023 1:02 PM Please check AMION for all [facility] Pharmacy numbers

## 2023-12-03 NOTE — TOC Progression Note (Signed)
Transition of Care [facility]) - Progression Note    Patient Details  Name: [name] MRN: [id number] Date of Birth: May 21, 1951  Transition of Care [facility]) CM/SW Contact  Nedra Mcinnis A [place], [place] Phone Number: 12/03/2023, 3:40 PM  Clinical Narrative:     Update 1504 CSW was notified by [facility] that they could extend a bed offer to pt for discharge. CSW reached out to pt's sister, as pt wanted sister's assistance for making decision on bed offer; there was no answer. CSW left VM with contact information to reach back out to CSW.    CSW contacted pt's sister at pt's request, to update on bed offer decision. She requested CSW reach out to [facility], CSW contacted liaison at facility. CSW waiting to hear back decision on referral. She stated that if [facility] declined then second choice would be to [facility].    TOC will continue to follow.   Expected Discharge Plan: Skilled Nursing Facility Barriers to Discharge: Continued Medical Work up, English as a second language teacher  Expected Discharge Plan and Services       Living arrangements for the past 2 months: Single Family Home                                       Social Determinants of Health (SDOH) Interventions SDOH Screenings   Food Insecurity: Patient Unable To Answer (11/26/2023)  Recent Concern: Food Insecurity - Food Insecurity Present (10/09/2023)  Housing: Patient Unable To Answer (10/09/2023)  Transportation Needs: No Transportation Needs (10/09/2023)  Utilities: Patient Unable To Answer (11/26/2023)  Depression (PHQ2-9): Low Risk  (10/10/2023)  Financial Resource Strain: Medium Risk (10/09/2023)  Physical Activity: Unknown (10/09/2023)  Social Connections: Moderately Integrated (10/09/2023)  Stress: No Stress Concern Present (10/09/2023)  Tobacco Use: Medium Risk (12/01/2023)    Readmission Risk Interventions     No data to display

## 2023-12-03 NOTE — Progress Notes (Addendum)
[name]   DOB:04-23-1951   OZ#:[id number]      ASSESSMENT & PLAN:  Poorly differentiated adenocarcinoma, unknown primary History of prostate CA -IR guided biopsy soft tissue mass anterior abdominal wall showed metastatic poorly differentiated adenocarcinoma.  IHC studies does not support lung primary. -CT cervical spine/head/A/P and maxillofacial done 11/26/23.  Shows large lytic lesion T1 likely mets with likely epidural spread of tumor along dorsal aspect of spinal canal at T1.  Also demonstrates multiple enlarged cervical lymph nodes in neck concerning for nodal mets; bil adrenal masses, multiple bone mets.  -CT chest done 11/27/2023 shows right hilar mass adenopathy, right adrenal mets, lytic mets.   -Seen by GI, upper endoscopy done 12/01/2023.  Negative for upper GI malignancy. -Follows with Dr. Ellin Saba, AP last seen October 2024 -Being followed at this time by Dr. Lucille Passy Onc  2.  Acute DVT -Right upper extremity DVT -IV heparin drip, continue per protocol -May switch to DOAC upon discharge -Monitor closely for bleeding  3. Anemia, normocytic -likely multifactorial due to malignancy +infectious process +CKD -Hemoglobin stable 8.0 on 12/02/2023  -recommend PRBC transfusion for hgb <7.0 -No indication for PRBC transfusion today -monitor closely   4 Sepsis -Continue IV Vanco and IV cefepime as ordered -Monitor fever curve   Code Status Full  Goals of care: Incurable metastatic disease, treatment options depend on primary malignancy.  Discharge planning: Follows with Dr. Ellin Saba, noted next outpatient appt 12/06/23. Address acute illness while hospitalized, arrange follow up with Dr. Ellin Saba upon discharge.   Subjective:  Patient seen awake and alert laying in bed.  Physical therapy working with patient.  Denies new acute complaints.  No acute distress noted.  Objective:  Vitals:   12/03/23 0430 12/03/23 0748  BP: (!) 123/54 (!) 121/57  Pulse: 100 96   Resp:  18  Temp: 98.5 F (36.9 C)   SpO2: 93% 94%     Intake/Output Summary (Last 24 hours) at 12/03/2023 1143 Last data filed at 12/02/2023 1809 Gross per 24 hour  Intake 300 ml  Output 250 ml  Net 50 ml     REVIEW OF SYSTEMS:   Constitutional: + Fatigue, denies fevers, chills or abnormal night sweats Eyes: Denies blurriness of vision, double vision or watery eyes Ears, nose, mouth, throat, and face: Denies mucositis or sore throat Respiratory: Denies cough, dyspnea or wheezes Cardiovascular: Denies palpitation, chest discomfort or lower extremity swelling Gastrointestinal:  Denies nausea, heartburn or change in bowel habits Skin: Denies abnormal skin rashes Lymphatics: Denies new lymphadenopathy or easy bruising Neurological: Denies numbness, tingling or new weaknesses Behavioral/Psych: Mood is stable, no new changes  All other systems were reviewed with the patient and are negative.  PHYSICAL EXAMINATION: ECOG PERFORMANCE STATUS: 3 - Symptomatic, >50% confined to bed  Vitals:   12/03/23 0430 12/03/23 0748  BP: (!) 123/54 (!) 121/57  Pulse: 100 96  Resp:  18  Temp: 98.5 F (36.9 C)   SpO2: 93% 94%   Filed Weights   12/01/23 0909 12/02/23 0500 12/03/23 0430  Weight: 214 lb 4.6 oz (97.2 kg) 211 lb 10.3 oz (96 kg) 208 lb 8.9 oz (94.6 kg)    GENERAL: +weak appearing, alert, no distress and comfortable SKIN: skin color, texture, turgor are normal, no rashes or significant lesions EYES: normal, conjunctiva are pink and non-injected, sclera clear OROPHARYNX: no exudate, no erythema and lips, buccal mucosa, and tongue normal  NECK: supple, thyroid normal size, non-tender, without nodularity LYMPH: no palpable lymphadenopathy in the cervical,  axillary or inguinal LUNGS: clear to auscultation and percussion with normal breathing effort HEART: regular rate & rhythm and no murmurs and no lower extremity edema ABDOMEN: abdomen soft, non-tender and normal bowel  sounds MUSCULOSKELETAL: no cyanosis of digits and no clubbing + right AKA PSYCH: alert & oriented x 3 with fluent speech NEURO: no focal motor/sensory deficits   All questions were answered. The patient knows to call the clinic with any problems, questions or concerns.   The total time spent in the appointment was 25 minutes encounter with patient including review of chart and various tests results, discussions about plan of care and coordination of care plan  [name], [name] 12/03/2023 11:43 AM    Labs Reviewed:  Lab Results  Component Value Date   WBC 12.6 (H) 12/02/2023   HGB 8.0 (L) 12/02/2023   HCT 25.1 (L) 12/02/2023   MCV 89.6 12/02/2023   PLT 293 12/02/2023   Recent Labs    11/27/23 0859 11/28/23 0323 11/28/23 0324 11/29/23 0659 11/30/23 0528 12/03/23 0702  NA 140  --    < > 134* 133* 130*  K 3.7  --    < > 3.8 3.9 4.4  CL 105  --    < > 102 104 103  CO2 26  --    < > 25 22 20*  GLUCOSE 160*  --    < > 98 128* 110*  BUN 60*  --    < > 29* 24* 21  CREATININE 1.38*  --    < > 0.97 0.97 0.82  CALCIUM 10.5*  --    < > 10.0 9.9 10.7*  GFRNONAA 54*  --    < > >60 >60 >60  PROT 5.3* 5.0*  --   --  4.7*  --   ALBUMIN 1.6* 1.5*  --   --  <1.5*  --   AST 119* 89*  --   --  58*  --   ALT 65* 58*  --   --  51*  --   ALKPHOS 77 75  --   --  72  --   BILITOT 0.8 0.6  --   --  0.4  --   BILIDIR  --  0.2  --   --   --   --   IBILI  --  0.4  --   --   --   --    < > = values in this interval not displayed.    Studies Reviewed:  MR PELVIS W WO CONTRAST  Result Date: 11/30/2023 CLINICAL DATA:  Soft tissue infection suspected evaluate for abscess and gas formation underneath the skin around the groin, penile, and scrotal area, clinical concern for cellulitis and candidiasis. Metastatic malignancy with soft tissue metastases in the pelvis. EXAM: MRI PELVIS WITHOUT AND WITH CONTRAST TECHNIQUE: Multiplanar multisequence MR imaging of the pelvis was performed both before and  after administration of intravenous contrast. CONTRAST:  9mL GADAVIST GADOBUTROL 1 MMOL/ML IV SOLN COMPARISON:  CT abdomen pelvis, 11/26/2023 FINDINGS: Urinary Tract:  No abnormality visualized. Bowel:  Unremarkable visualized pelvic bowel loops. Vascular/Lymphatic: Multiple enlarged bilateral inguinal lymph nodes left inguinal nodes measuring up to 2.0 x 1.3 cm (series 13, image 15). No significant vascular abnormality seen. Reproductive: Status post prostatectomy. Soft tissue edema of the scrotum and penis. No focal fluid collection. Other: Diffuse anasarca. Multiple enhancing subcutaneous soft tissue nodules, including within the pubic subcutaneous fat measuring 3.3 x 2.8 cm (series 13, image  18), in the subcutaneous fat of the anterior left thigh measuring 2.1 x 1.9 cm (series 13, image 8), overlying the left hamstring musculature measuring 2.6 x 2.0 cm (series 13, image 56), and in the right biceps femoris muscle body measuring 2.6 x 2.5 cm (series 13, image 66). Musculoskeletal: Multiple small enhancing osseous metastases, including of the greater trochanter of the left femur (series 13, image 6), and of the left ischial ramus (series 13, image 21). IMPRESSION: 1. Soft tissue edema of the scrotum and penis, consistent with cellulitis. No focal fluid collection or gas to suggest abscess or necrotizing fasciitis. 2. Multiple enhancing subcutaneous and intramuscular metastatic soft tissue nodules throughout the pelvis and included proximal femurs. 3. Multiple enlarged bilateral inguinal lymph nodes, possibly reactive although highly suspicious for nodal metastases. 4. Multiple small enhancing osseous metastases. 5. Status post prostatectomy. Electronically Signed   By: Jearld Lesch M.D.   On: 11/30/2023 15:32   VAS [place] UPPER EXTREMITY VENOUS DUPLEX  Result Date: 11/30/2023 UPPER VENOUS STUDY  Patient Name:  WILLETT OZBIRN  Date of Exam:   11/30/2023 Medical Rec #: [id number]            Accession #:     [id number] Date of Birth: 07-Jan-1951            Patient Gender: M Patient Age:   72 years Exam Location:  [facility] Procedure:      VAS [place] UPPER EXTREMITY VENOUS DUPLEX Referring Phys: RALPH NETTEY --------------------------------------------------------------------------------  Indications: Swelling Other Indications: Testing upon admission revealed multiple enlarged cervical lymph nodes predominantly on the right side. Adenopathy also envolving right axillary and supraclavicular areas. Soft tissue lesions seen on CT and MRI. Anticoagulation: Heparin. Limitations: Poor ultrasound/tissue interface and line. Comparison Study: No previous UEV exams Performing Technologist: Jody Hill RVT, RDMS  Examination Guidelines: A complete evaluation includes B-mode imaging, spectral Doppler, color Doppler, and power Doppler as needed of all accessible portions of each vessel. Bilateral testing is considered an integral part of a complete examination. Limited examinations for reoccurring indications may be performed as noted.  Right Findings: +----------+------------+---------+-----------+----------+---------------------+ RIGHT     CompressiblePhasicitySpontaneousProperties       Summary        +----------+------------+---------+-----------+----------+---------------------+ IJV           Full       Yes       Yes                                  +----------+------------+---------+-----------+----------+---------------------+ Subclavian    None       No        No                Acute - mid and                                                               distal         +----------+------------+---------+-----------+----------+---------------------+ Axillary      None       No        No                       Acute         +----------+------------+---------+-----------+----------+---------------------+  Brachial    Partial                                 Acute proximal - one                                                             of paired       +----------+------------+---------+-----------+----------+---------------------+ Radial        Full                                                        +----------+------------+---------+-----------+----------+---------------------+ Ulnar         Full                                                        +----------+------------+---------+-----------+----------+---------------------+ Cephalic      None       No        No                       Acute         +----------+------------+---------+-----------+----------+---------------------+ Basilic       None       No        No                       Acute         +----------+------------+---------+-----------+----------+---------------------+   Extrinsic compression of jugular and proximal subclavian veins from several solid appearing masses of neck and clavicle area. Cannot differentiate thyroid from myph nodes / masses. Subcutaneous fluid of right neck.  Left Findings: +----------+------------+---------+-----------+----------+------------------+ LEFT      CompressiblePhasicitySpontaneousProperties     Summary       +----------+------------+---------+-----------+----------+------------------+ IJV           Full       Yes       Yes                                 +----------+------------+---------+-----------+----------+------------------+ Subclavian    Full       Yes       Yes                                 +----------+------------+---------+-----------+----------+------------------+ Axillary      Full       Yes       Yes                                 +----------+------------+---------+-----------+----------+------------------+ Brachial      Full       Yes       Yes                                 +----------+------------+---------+-----------+----------+------------------+  Radial        Full                                                      +----------+------------+---------+-----------+----------+------------------+ Ulnar                                               unable to insonate +----------+------------+---------+-----------+----------+------------------+ Cephalic      None       No        No                     Acute        +----------+------------+---------+-----------+----------+------------------+ Basilic       Full       Yes       Yes                                 +----------+------------+---------+-----------+----------+------------------+ hypoechoic heterogenous superficial solid appearing structure seen proximally to axillary region (3.0 x 1.6 x 2.5 cm) & in mid upper arm (1.9 x 1.1 x 1.5 cm). hypoechoic solid appearing intramuscular structure seen in mid upper arm (2.0 x 0.9 x 1.1 cm).  Summary:  Right: Findings consistent with acute deep vein thrombosis involving the right subclavian vein, right axillary vein and right brachial veins. Findings consistent with acute superficial vein thrombosis involving the right basilic vein and right cephalic vein.  Left: Findings consistent with acute superficial vein thrombosis involving the left cephalic vein. However, unable to visualize the ulnar veins.  *See table(s) above for measurements and observations.  Diagnosing physician: Carolynn Sayers Electronically signed by Carolynn Sayers on 11/30/2023 at 2:15:21 PM.    Final    IR US Guide Bx Asp/Drain  Result Date: 11/28/2023 INDICATION: 72 year old male referred for biopsy. He has suspicion of lung carcinoma EXAM: ULTRASOUND-GUIDED BIOPSY OF LEFT LOWER QUADRANT ABDOMINAL WALL MASS. MEDICATIONS: None. ANESTHESIA/SEDATION: None COMPLICATIONS: None PROCEDURE: Informed written consent was obtained from the patient after a thorough discussion of the procedural risks, benefits and alternatives. All questions were addressed. Maximal Sterile Barrier Technique was utilized including caps, mask, sterile gowns,  sterile gloves, sterile drape, hand hygiene and skin antiseptic. A timeout was performed prior to the initiation of the procedure. The procedure, risks, benefits, and alternatives were explained to the patient. Questions regarding the procedure were encouraged and answered. The patient understands and consents to the procedure. Ultrasound survey was performed with images stored and sent to PACs. The left anterior abdominal wall was prepped with chlorhexidine in a sterile fashion, and a sterile drape was applied covering the operative field. A sterile gown and sterile gloves were used for the procedure. Local anesthesia was provided with 1% Lidocaine. Ultrasound guidance was used to infiltrate the region with 1% lidocaine for local anesthesia. Multiple 18 gauge core biopsy were then acquired of the soft tissue nodule using ultrasound guidance. Images were stored. Final image was stored after biopsy. Patient tolerated the procedure well and remained hemodynamically stable throughout. No complications were encountered and no significant blood loss was encounter IMPRESSION: Status post ultrasound-guided anterior abdominal wall mass biopsy Signed, Yvone Neu. Loreta Ave, DO,  ABVM, RPVI [facility] Electronically Signed   By: Gilmer Mor D.O.   On: 11/28/2023 13:54   MR THORACIC SPINE W WO CONTRAST  Result Date: 11/28/2023 CLINICAL DATA:  Metastatic disease evaluation EXAM: MRI THORACIC WITHOUT AND WITH CONTRAST TECHNIQUE: Multiplanar and multiecho pulse sequences of the thoracic spine were obtained without and with intravenous contrast. CONTRAST:  9mL GADAVIST GADOBUTROL 1 MMOL/ML IV SOLN COMPARISON:  None Available. FINDINGS: Alignment:  Physiologic. Vertebrae: Soft tissue mass replacing the T1 spinous process. Metastatic lesions of the vertebral bodies at T5, T6, T10 and L1. The T10 lesion extends into the right articular pillar. There is a pathologic fracture  at the posterior aspect of the T10 vertebral body with approximately 25% height loss. Cord:  Normal signal and morphology. Paraspinal and other soft tissues: There are nodular soft tissue lesions superior to both scapulae. There is an additional soft tissue nodule within the posterior subcutaneous fat at the L1 level. Disc levels: Axial images are markedly motion degraded. There is severe spinal canal stenosis at the T1 level secondary to dorsal mass effect from the soft tissue mass replacing the spinous process. There is mild spinal canal stenosis at the T10 level due to posterior bulging of the vertebral body lesion. IMPRESSION: 1. Soft tissue mass replacing the T1 spinous process causing severe spinal canal stenosis. 2. Metastatic lesions of the vertebral bodies at T5, T6, T10 and L1. 3. Pathologic fracture at the posterior aspect of the T10 vertebral body with approximately 25% height loss. 4. Nodular soft tissue lesions superior to both scapulae and within the posterior subcutaneous fat at the L1 level, consistent with metastatic disease. Electronically Signed   By: Deatra Robinson M.D.   On: 11/28/2023 02:56   CT CHEST WO CONTRAST  Result Date: 11/27/2023 CLINICAL DATA:  Concern for lung cancer and metastatic disease. Interstitial lung disease. EXAM: CT CHEST WITHOUT CONTRAST TECHNIQUE: Multidetector CT imaging of the chest was performed following the standard protocol without IV contrast. RADIATION DOSE REDUCTION: This exam was performed according to the departmental dose-optimization program which includes automated exposure control, adjustment of the mA and/or kV according to patient size and/or use of iterative reconstruction technique. COMPARISON:  Chest CT dated 11/12/2023. FINDINGS: Evaluation of this exam is limited in the absence of intravenous contrast. Cardiovascular: Top-normal cardiac size. No pericardial effusion. Three-vessel coronary vascular calcification. Moderate atherosclerotic  calcification of the thoracic aorta. No aneurysmal dilatation. The central pulmonary arteries are grossly unremarkable. Mediastinum/Nodes: Subcarinal adenopathy measuring 25 mm short axis. Right hilar mass/adenopathy measures 29 mm in short axis. Mediastinal adenopathy anterior to the carina measures 15 mm short axis. Additional upper mediastinal and right supraclavicular adenopathy as seen on the prior CT. No mediastinal fluid collection. Lungs/Pleura: Debris is noted in the bronchus intermedius extending into the right lower lobe bronchus. There is consolidative changes of the majority of the right lower lobe consistent with post obstructive atelectasis or infiltrate. There is partial consolidation of the left lower lobe. No pleural effusion or pneumothorax. Upper Abdomen: Right adrenal thickening consistent with metastasis. Musculoskeletal: Scattered osseous lytic metastasis involving the T5, T6, and T10 with associated pathologic fracture as seen previously. Extension of the metastasis at T10 to the central spinal with focal narrowing of the central canal. Additional lytic metastasis involving bilateral ribs. The largest lesion measures approximately 4.8 x 2.5 cm involving the anterior right fifth rib with associated destruction of the rib. Bilateral axillary adenopathy measure up to 17 mm in  short axis on the left. IMPRESSION: 1. Right hilar mass/adenopathy as well as mediastinal, axillary, and right supraclavicular adenopathy. 2. Debris in the bronchus intermedius and right lower lobe bronchus with post obstructive atelectasis or infiltrate of the majority of the right lower lobe. 3. Partial consolidation of the left lower lobe. 4. Right adrenal metastasis. 5. Osseous lytic metastasis with associated pathologic fractures as above. 6.  Aortic Atherosclerosis (ICD10-I70.0). Electronically Signed   By: Elgie Collard M.D.   On: 11/27/2023 19:26   VAS [place] LOWER EXTREMITY VENOUS (DVT)  Result Date: 11/27/2023   Lower Venous DVT Study Patient Name:  KEREL ZEIDERS  Date of Exam:   11/27/2023 Medical Rec #: [id number]            Accession #:    [id number] Date of Birth: Mar 21, 1951            Patient Gender: M Patient Age:   29 years Exam Location:  [facility] Procedure:      VAS [place] LOWER EXTREMITY VENOUS (DVT) Referring Phys: JESSICA MARSHALL --------------------------------------------------------------------------------  Indications: Swelling, and Recent fall. History of right AKA on 03/09/23.  Risk Factors: Surgery History of right common femoral to posterior tibial artery bypass on 12/12/22. Comparison Study: No priors. Performing Technologist: Marilynne Halsted RDMS, RVT  Examination Guidelines: A complete evaluation includes B-mode imaging, spectral Doppler, color Doppler, and power Doppler as needed of all accessible portions of each vessel. Bilateral testing is considered an integral part of a complete examination. Limited examinations for reoccurring indications may be performed as noted. The reflux portion of the exam is performed with the patient in reverse Trendelenburg.  +---------+---------------+---------+-----------+----------+-------------------+ RIGHT    CompressibilityPhasicitySpontaneityPropertiesThrombus Aging      +---------+---------------+---------+-----------+----------+-------------------+ CFV      Partial        Yes      Yes                  Chronic             +---------+---------------+---------+-----------+----------+-------------------+ FV Prox  Partial                                      Chronic             +---------+---------------+---------+-----------+----------+-------------------+ FV Mid   Partial                                      Chronic             +---------+---------------+---------+-----------+----------+-------------------+ FV Distal                                             Not well visualized  +---------+---------------+---------+-----------+----------+-------------------+ PFV      Partial                                                          +---------+---------------+---------+-----------+----------+-------------------+ POP  AKA                 +---------+---------------+---------+-----------+----------+-------------------+ Occluded bypass graft.  +---------+---------------+---------+-----------+----------+-----------------+ LEFT     CompressibilityPhasicitySpontaneityPropertiesThrombus Aging    +---------+---------------+---------+-----------+----------+-----------------+ CFV      Full           Yes      Yes                                    +---------+---------------+---------+-----------+----------+-----------------+ SFJ      Full                                                           +---------+---------------+---------+-----------+----------+-----------------+ FV Prox  Full                                                           +---------+---------------+---------+-----------+----------+-----------------+ FV Mid   Full                                                           +---------+---------------+---------+-----------+----------+-----------------+ FV DistalPartial        Yes      Yes                  Age Indeterminate +---------+---------------+---------+-----------+----------+-----------------+ PFV      Full                                                           +---------+---------------+---------+-----------+----------+-----------------+ POP      Partial        No       No                   Age Indeterminate +---------+---------------+---------+-----------+----------+-----------------+ PTV      None                                         Age Indeterminate +---------+---------------+---------+-----------+----------+-----------------+ PERO     None                                          Age Indeterminate +---------+---------------+---------+-----------+----------+-----------------+     Summary: RIGHT: - Findings consistent with chronic deep vein thrombosis involving the right femoral vein.  LEFT: - Findings consistent with age indeterminate deep vein thrombosis involving the left femoral vein, left popliteal vein, left posterior tibial veins, and left peroneal veins.   *See table(s) above for measurements and observations. Electronically signed by Lemar Livings MD on 11/27/2023 at 5:15:26 PM.  Final    ECHOCARDIOGRAM COMPLETE  Result Date: 11/27/2023    ECHOCARDIOGRAM REPORT   Patient Name:   TEDMAN LILLY Date of Exam: 11/27/2023 Medical Rec #:  [id number]           Height:       70.0 in Accession #:    [id number]          Weight:       201.5 lb Date of Birth:  10-23-1951           BSA:          2.094 m Patient Age:    72 years            BP:           105/57 mmHg Patient Gender: M                   HR:           90 bpm. Exam Location:  Inpatient Procedure: 2D Echo, Cardiac Doppler and Color Doppler Indications:    Shock R57.9  History:        Patient has no prior history of Echocardiogram examinations. CKD                 and PAD; Risk Factors:Hypertension and Former Smoker.  Sonographer:    Dondra Prader RVT RCS Referring Phys: JESSICA MARSHALL  Sonographer Comments: Image acquisition challenging due to uncooperative patient and Image acquisition challenging due to respiratory motion. Patient unable to reposition; Echo done with patient supine. Unable to do breath holds. IMPRESSIONS  1. Left ventricular ejection fraction, by estimation, is 60 to 65%. The left ventricle has normal function. The left ventricle has no regional wall motion abnormalities. There is mild left ventricular hypertrophy. Left ventricular diastolic parameters were normal.  2. Right ventricular systolic function is hyperdynamic. The right ventricular size is normal. Tricuspid  regurgitation signal is inadequate for assessing PA pressure.  3. The mitral valve is normal in structure. No evidence of mitral valve regurgitation. No evidence of mitral stenosis.  4. Abnormal echodensity seen in the right atrium only in subcostal view. This is superior to the tricuspid valve annulus. Cannot exclude normal variant anatomy, only seen in clip 65. Not seen in recent CTPE for comparison. The tricuspid valve is abnormal.  5. The aortic valve is tricuspid. Aortic valve regurgitation is mild to moderate. Aortic valve sclerosis is present, with no evidence of aortic valve stenosis.  6. The inferior vena cava is dilated in size with <50% respiratory variability, suggesting right atrial pressure of 15 mmHg. Comparison(s): No prior Echocardiogram. FINDINGS  Left Ventricle: Left ventricular ejection fraction, by estimation, is 60 to 65%. The left ventricle has normal function. The left ventricle has no regional wall motion abnormalities. The left ventricular internal cavity size was normal in size. There is  mild left ventricular hypertrophy. Left ventricular diastolic parameters were normal. Right Ventricle: The right ventricular size is normal. No increase in right ventricular wall thickness. Right ventricular systolic function is hyperdynamic. Tricuspid regurgitation signal is inadequate for assessing PA pressure. Left Atrium: Left atrial size was normal in size. Right Atrium: Right atrial size was normal in size. Pericardium: There is no evidence of pericardial effusion. Mitral Valve: The mitral valve is normal in structure. No evidence of mitral valve regurgitation. No evidence of mitral valve stenosis. Tricuspid Valve: Abnormal echodensity seen in the right atrium only in subcostal view. This is superior to the tricuspid valve annulus.  Cannot exclude normal variant anatomy, only seen in clip 65. Not seen in recent CTPE for comparison. The tricuspid valve is abnormal. Tricuspid valve regurgitation is not  demonstrated. No evidence of tricuspid stenosis. Aortic Valve: The aortic valve is tricuspid. There is mild aortic valve annular calcification. Aortic valve regurgitation is mild to moderate. Aortic regurgitation PHT measures 360 msec. Aortic valve sclerosis is present, with no evidence of aortic valve  stenosis. Aortic valve mean gradient measures 5.0 mmHg. Aortic valve peak gradient measures 9.1 mmHg. Aortic valve area, by VTI measures 2.41 cm. Pulmonic Valve: The pulmonic valve was normal in structure. Pulmonic valve regurgitation is trivial. No evidence of pulmonic stenosis. Aorta: The aortic root and ascending aorta are structurally normal, with no evidence of dilitation. Venous: The inferior vena cava is dilated in size with less than 50% respiratory variability, suggesting right atrial pressure of 15 mmHg. IAS/Shunts: No atrial level shunt detected by color flow Doppler.  LEFT VENTRICLE PLAX 2D LVIDd:         4.20 cm   Diastology LVIDs:         2.60 cm   LV e' medial:    8.70 cm/s LV PW:         1.10 cm   LV E/e' medial:  9.9 LV IVS:        1.20 cm   LV e' lateral:   10.10 cm/s LVOT diam:     2.00 cm   LV E/e' lateral: 8.5 LV SV:         63 LV SV Index:   30 LVOT Area:     3.14 cm  RIGHT VENTRICLE             IVC RV S prime:     12.20 cm/s  IVC diam: 2.15 cm TAPSE (M-mode): 2.8 cm LEFT ATRIUM             Index        RIGHT ATRIUM           Index LA diam:        2.90 cm 1.38 cm/m   RA Area:     11.80 cm LA Vol (A2C):   30.1 ml 14.37 ml/m  RA Volume:   27.10 ml  12.94 ml/m LA Vol (A4C):   20.0 ml 9.55 ml/m LA Biplane Vol: 27.0 ml 12.89 ml/m  AORTIC VALVE                     PULMONIC VALVE AV Area (Vmax):    2.33 cm      PV Vmax:       1.04 m/s AV Area (Vmean):   2.38 cm      PV Peak grad:  4.3 mmHg AV Area (VTI):     2.41 cm AV Vmax:           151.00 cm/s AV Vmean:          100.000 cm/s AV VTI:            0.261 m AV Peak Grad:      9.1 mmHg AV Mean Grad:      5.0 mmHg LVOT Vmax:         112.00 cm/s  LVOT Vmean:        75.600 cm/s LVOT VTI:          0.200 m LVOT/AV VTI ratio: 0.77 AI PHT:            360 msec  AR Vena Contracta: 0.50 cm  AORTA Ao Root diam: 3.40 cm Ao Asc diam:  3.30 cm MITRAL VALVE MV Area (PHT): 4.63 cm    SHUNTS MV Decel Time: 164 msec    Systemic VTI:  0.20 m MV E velocity: 85.90 cm/s  Systemic Diam: 2.00 cm MV A velocity: 88.00 cm/s MV E/A ratio:  0.98 Riley Lam MD Electronically signed by Riley Lam MD Signature Date/Time: 11/27/2023/9:13:01 AM    Final    CT ABDOMEN PELVIS W CONTRAST  Result Date: 11/26/2023 CLINICAL DATA:  Increasing weakness for 2-3 days with recent fall. Scrotal mass or lump, scrotal wound, concern for abscess versus deep space infection. Evidence of metastatic disease on recent chest CTA. * Tracking Code: BO * EXAM: CT ABDOMEN AND PELVIS WITH CONTRAST TECHNIQUE: Multidetector CT imaging of the abdomen and pelvis was performed using the standard protocol following bolus administration of intravenous contrast. RADIATION DOSE REDUCTION: This exam was performed according to the departmental dose-optimization program which includes automated exposure control, adjustment of the mA and/or kV according to patient size and/or use of iterative reconstruction technique. CONTRAST:  75mL OMNIPAQUE IOHEXOL 300 MG/ML  SOLN COMPARISON:  Noncontrast CT of the abdomen and pelvis 03/05/2019. Chest CTA 11/12/2023. FINDINGS: Lower chest: Mild dependent atelectasis at both lung bases. No significant pleural or pericardial effusion. There is aortic and coronary artery atherosclerosis. Partially imaged mass again noted involving the anterior aspect of the right 5th rib, measuring up to 3.8 x 2.9 cm on image 2/1. Hepatobiliary: The liver is normal in density without suspicious focal abnormality. Subcentimeter low-density lesion inferiorly in the right hepatic lobe on image 26/1 is unchanged from remote abdominal CT, likely a cyst. No evidence of gallstones, gallbladder  wall thickening or biliary dilatation. Pancreas: Unremarkable. No pancreatic ductal dilatation or surrounding inflammatory changes. Spleen: Normal in size without focal abnormality. Adrenals/Urinary Tract: As seen on recent chest CT, new bilateral adrenal masses consistent with metastatic disease. Right adrenal nodule measures 3.0 x 2.0 cm on image 20/1 and left adrenal nodule measures 2.3 x 2.2 cm on image 24/1. No evidence of urinary tract calculus, suspicious renal lesion or hydronephrosis. There are renal cysts bilaterally which are similar to remote abdominal CT. The bladder appears unremarkable for its degree of distention. Stomach/Bowel: No definite enteric contrast administered. There is high density colonic stool. The stomach appears unremarkable for its degree of distension. No evidence of bowel wall thickening, distention or surrounding inflammatory change. The appendix appears normal. Vascular/Lymphatic: There are no enlarged abdominal or pelvic lymph nodes. Aortic and branch vessel atherosclerosis without evidence of aneurysm or large vessel occlusion in the abdomen or pelvis. Postsurgical changes at the right groin consistent with femoral arterial bypass grafting. There is low-density within the femoral graft suspicious for occlusion. In addition, there is low-density in the right femoral vein which could reflect a DVT. No intrapelvic extension identified. Reproductive: Status post prostatectomy. No recurrent mass lesion identified. Other: As partially imaged on recent chest CT, there are multiple abdominal wall masses consistent with metastatic disease. Representative lesions include a 3.4 cm mass posteriorly in the left mid abdominal wall on image 33/1, a 3.2 x 2.2 cm mass in the left anterior abdominal wall on image 42/1 and a 2.9 cm right suprapubic mass on image 84/1. No obvious scrotal mass or fluid collection. In addition to the subcutaneous lesions, there are 2 retroperitoneal masses on the  right, largest measuring 1.7 cm on image 44/1. There is a supraumbilical hernia containing only  fat. No ascites or free air. Musculoskeletal: As above, multiple subcutaneous metastases. In addition, as seen on recent chest CT, there is a large destructive mass involving the left aspect of the T10 vertebral body, measuring 3.5 x 3.0 cm on image 8/1. This mass is associated with intraspinal extension and possible thoracic cord compression, as before. There are other smaller osseous metastases within the L1 and L4 vertebral bodies, the right superior pubic ramus, the left superior acetabulum and the left femoral greater trochanter. IMPRESSION: 1. Findings are consistent with metastatic disease, including multiple subcutaneous metastases, bilateral adrenal masses, right retroperitoneal masses and multiple osseous metastases. 2. The largest osseous metastasis involves the T10 vertebral body with intraspinal extension and possible thoracic cord compression, as on previous chest CTA. Consider thoracic spine MRI for further evaluation. 3. No obvious scrotal mass or fluid collection. 4. Postsurgical changes at the right groin consistent with femoral arterial bypass grafting. Low-density within the femoral graft is suspicious for occlusion. In addition, there is low-density in the right femoral vein which could reflect a DVT or sequela of poor inflow. No intrapelvic extension identified. 5.  Aortic Atherosclerosis (ICD10-I70.0). Electronically Signed   By: Carey Bullocks M.D.   On: 11/26/2023 15:35   CT HEAD WO CONTRAST ( )  Result Date: 11/26/2023 CLINICAL DATA:  Head trauma, minor (Age >= 65y); Facial trauma, blunt; Neck trauma (Age >= 65y) EXAM: CT HEAD WITHOUT CONTRAST CT MAXILLOFACIAL WITHOUT CONTRAST CT CERVICAL SPINE WITHOUT CONTRAST TECHNIQUE: Multidetector CT imaging of the head, cervical spine, and maxillofacial structures were performed using the standard protocol without intravenous contrast. Multiplanar CT  image reconstructions of the cervical spine and maxillofacial structures were also generated. RADIATION DOSE REDUCTION: This exam was performed according to the departmental dose-optimization program which includes automated exposure control, adjustment of the mA and/or kV according to patient size and/or use of iterative reconstruction technique. COMPARISON:  CT Chest 11/12/23 FINDINGS: CT HEAD FINDINGS Brain: No hemorrhage. No hydrocephalus. No extra-axial fluid collection. No CT evidence of cortical infarct. No mass effect. No mass lesion. Vascular: No hyperdense vessel or unexpected calcification. Skull: Normal. Negative for fracture or focal lesion. Other: Soft tissue hematoma along the left frontal scalp CT MAXILLOFACIAL FINDINGS Osseous: No fracture or mandibular dislocation. No destructive process. Orbits: Negative. No traumatic or inflammatory finding. Sinuses: No middle ear or mastoid effusion. Paranasal sinuses are notable for mucosal thickening left maxillary sinus. Postsurgical changes from prior left maxillary antrostomy. Soft tissues: Soft tissue swelling in the periorbital soft tissues on the left. CT CERVICAL SPINE FINDINGS Alignment: Normal. Skull base and vertebrae: No acute fracture. There is a large lytic lesion involving the T1 spinous process and lamina, worrisome for osseous metastatic disease. Soft tissues and spinal canal: There are multiple enlarged cervical lymph nodes predominantly in the right neck, for example a 2.1 cm right level 3 lymph node (series 5, image 52) and a 2.1 cm right paraesophageal lymph node. Disc levels: There is likely epidural spread of tumor along the dorsal aspect of the spinal canal at the T1 level (series 5, image 63). Upper chest: See separately dictated CT chest abdomen and pelvis for additional findings Other: No IMPRESSION: 1. No acute intracranial abnormality. 2. No acute facial bone fracture. 3. No acute fracture or traumatic listhesis of the cervical  spine. 4. Large lytic lesion involving the T1 spinous process and lamina, worrisome for osseous metastatic disease. There is likely epidural spread of tumor along the dorsal aspect of the spinal canal at the T1  level. This is unchanged from 11/12/23 5. Multiple enlarged cervical lymph nodes, predominantly in the right neck, worrisome for nodal metastatic disease. 6. Soft tissue hematoma along the left frontal scalp. No underlying calvarial fracture. Electronically Signed   By: Lorenza Cambridge M.D.   On: 11/26/2023 13:59   CT CERVICAL SPINE WO CONTRAST  Result Date: 11/26/2023 CLINICAL DATA:  Head trauma, minor (Age >= 65y); Facial trauma, blunt; Neck trauma (Age >= 65y) EXAM: CT HEAD WITHOUT CONTRAST CT MAXILLOFACIAL WITHOUT CONTRAST CT CERVICAL SPINE WITHOUT CONTRAST TECHNIQUE: Multidetector CT imaging of the head, cervical spine, and maxillofacial structures were performed using the standard protocol without intravenous contrast. Multiplanar CT image reconstructions of the cervical spine and maxillofacial structures were also generated. RADIATION DOSE REDUCTION: This exam was performed according to the departmental dose-optimization program which includes automated exposure control, adjustment of the mA and/or kV according to patient size and/or use of iterative reconstruction technique. COMPARISON:  CT Chest 11/12/23 FINDINGS: CT HEAD FINDINGS Brain: No hemorrhage. No hydrocephalus. No extra-axial fluid collection. No CT evidence of cortical infarct. No mass effect. No mass lesion. Vascular: No hyperdense vessel or unexpected calcification. Skull: Normal. Negative for fracture or focal lesion. Other: Soft tissue hematoma along the left frontal scalp CT MAXILLOFACIAL FINDINGS Osseous: No fracture or mandibular dislocation. No destructive process. Orbits: Negative. No traumatic or inflammatory finding. Sinuses: No middle ear or mastoid effusion. Paranasal sinuses are notable for mucosal thickening left maxillary  sinus. Postsurgical changes from prior left maxillary antrostomy. Soft tissues: Soft tissue swelling in the periorbital soft tissues on the left. CT CERVICAL SPINE FINDINGS Alignment: Normal. Skull base and vertebrae: No acute fracture. There is a large lytic lesion involving the T1 spinous process and lamina, worrisome for osseous metastatic disease. Soft tissues and spinal canal: There are multiple enlarged cervical lymph nodes predominantly in the right neck, for example a 2.1 cm right level 3 lymph node (series 5, image 52) and a 2.1 cm right paraesophageal lymph node. Disc levels: There is likely epidural spread of tumor along the dorsal aspect of the spinal canal at the T1 level (series 5, image 63). Upper chest: See separately dictated CT chest abdomen and pelvis for additional findings Other: No IMPRESSION: 1. No acute intracranial abnormality. 2. No acute facial bone fracture. 3. No acute fracture or traumatic listhesis of the cervical spine. 4. Large lytic lesion involving the T1 spinous process and lamina, worrisome for osseous metastatic disease. There is likely epidural spread of tumor along the dorsal aspect of the spinal canal at the T1 level. This is unchanged from 11/12/23 5. Multiple enlarged cervical lymph nodes, predominantly in the right neck, worrisome for nodal metastatic disease. 6. Soft tissue hematoma along the left frontal scalp. No underlying calvarial fracture. Electronically Signed   By: Lorenza Cambridge M.D.   On: 11/26/2023 13:59   CT Maxillofacial Wo Contrast  Result Date: 11/26/2023 CLINICAL DATA:  Head trauma, minor (Age >= 65y); Facial trauma, blunt; Neck trauma (Age >= 65y) EXAM: CT HEAD WITHOUT CONTRAST CT MAXILLOFACIAL WITHOUT CONTRAST CT CERVICAL SPINE WITHOUT CONTRAST TECHNIQUE: Multidetector CT imaging of the head, cervical spine, and maxillofacial structures were performed using the standard protocol without intravenous contrast. Multiplanar CT image reconstructions of the  cervical spine and maxillofacial structures were also generated. RADIATION DOSE REDUCTION: This exam was performed according to the departmental dose-optimization program which includes automated exposure control, adjustment of the mA and/or kV according to patient size and/or use of iterative reconstruction technique. COMPARISON:  CT Chest 11/12/23 FINDINGS: CT HEAD FINDINGS Brain: No hemorrhage. No hydrocephalus. No extra-axial fluid collection. No CT evidence of cortical infarct. No mass effect. No mass lesion. Vascular: No hyperdense vessel or unexpected calcification. Skull: Normal. Negative for fracture or focal lesion. Other: Soft tissue hematoma along the left frontal scalp CT MAXILLOFACIAL FINDINGS Osseous: No fracture or mandibular dislocation. No destructive process. Orbits: Negative. No traumatic or inflammatory finding. Sinuses: No middle ear or mastoid effusion. Paranasal sinuses are notable for mucosal thickening left maxillary sinus. Postsurgical changes from prior left maxillary antrostomy. Soft tissues: Soft tissue swelling in the periorbital soft tissues on the left. CT CERVICAL SPINE FINDINGS Alignment: Normal. Skull base and vertebrae: No acute fracture. There is a large lytic lesion involving the T1 spinous process and lamina, worrisome for osseous metastatic disease. Soft tissues and spinal canal: There are multiple enlarged cervical lymph nodes predominantly in the right neck, for example a 2.1 cm right level 3 lymph node (series 5, image 52) and a 2.1 cm right paraesophageal lymph node. Disc levels: There is likely epidural spread of tumor along the dorsal aspect of the spinal canal at the T1 level (series 5, image 63). Upper chest: See separately dictated CT chest abdomen and pelvis for additional findings Other: No IMPRESSION: 1. No acute intracranial abnormality. 2. No acute facial bone fracture. 3. No acute fracture or traumatic listhesis of the cervical spine. 4. Large lytic lesion  involving the T1 spinous process and lamina, worrisome for osseous metastatic disease. There is likely epidural spread of tumor along the dorsal aspect of the spinal canal at the T1 level. This is unchanged from 11/12/23 5. Multiple enlarged cervical lymph nodes, predominantly in the right neck, worrisome for nodal metastatic disease. 6. Soft tissue hematoma along the left frontal scalp. No underlying calvarial fracture. Electronically Signed   By: Lorenza Cambridge M.D.   On: 11/26/2023 13:59   CT Angio Chest PE W and/or Wo Contrast  Result Date: 11/13/2023 CLINICAL DATA:  Shortness of breath. Concern for pulmonary embolism. EXAM: CT ANGIOGRAPHY CHEST WITH CONTRAST TECHNIQUE: Multidetector CT imaging of the chest was performed using the standard protocol during bolus administration of intravenous contrast. Multiplanar CT image reconstructions and MIPs were obtained to evaluate the vascular anatomy. RADIATION DOSE REDUCTION: This exam was performed according to the departmental dose-optimization program which includes automated exposure control, adjustment of the mA and/or kV according to patient size and/or use of iterative reconstruction technique. CONTRAST:  75mL OMNIPAQUE IOHEXOL 350 MG/ML SOLN COMPARISON:  CT dated 03/05/2019. Chest radiograph dated 11/12/2023. FINDINGS: Cardiovascular: There is no cardiomegaly or pericardial effusion. There is coronary vascular calcification. Moderate atherosclerotic calcification of the thoracic aorta. No aneurysmal dilatation or dissection. The origins of the great vessels of the aortic arch appear patent. No pulmonary artery embolus identified. Mediastinum/Nodes: Right hilar adenopathy measures 2.5 cm in short axis. Subcarinal lymph node measures 2.4 cm. Upper mediastinal adenopathy measures 2 cm in short axis. Right supraclavicular adenopathy measures 2.5 cm short axis. Mildly enlarged and rounded left axillary lymph nodes. The esophagus is grossly unremarkable. No  mediastinal fluid collection. Lungs/Pleura: The lungs are clear. There is no pleural effusion or pneumothorax. The central airways are patent. Upper Abdomen: Bilateral adrenal thickening and nodularity concerning for metastatic disease. An 11 mm nodule in the right upper abdomen posterior to the superior pole of the right kidney consistent with metastatic disease. Musculoskeletal: Several metastatic implants in the subcutaneous soft tissues of the posterior thoracic wall measuring up to 3  cm in the left lower posterior chest wall. Multiple lytic metastasis with involvement of T5, T6, and T10 vertebra. There is associated pathologic fracture of T10 with destruction and buckling of the posterior cortex and soft tissue mass extending into the central canal. There is focal high-grade narrowing of the central canal at T10. MRI may provide better evaluation. Additional lesion involving the right anterior chest wall and fifth rib as well as lytic lesion of the lateral left ninth rib. Review of the MIP images confirms the above findings. IMPRESSION: 1. No CT evidence of pulmonary artery embolus. 2. Mediastinal, right hilar, right supraclavicular, and left axillary adenopathy. 3. Upper abdominal metastasis involving the adrenal glands. 4. Multiple osseous and chest wall metastatic disease. Associated pathologic fracture of T10 with high-grade focal narrowing of the central canal. MRI may provide better evaluation. 5.  Aortic Atherosclerosis (ICD10-I70.0). Electronically Signed   By: Elgie Collard M.D.   On: 11/13/2023 00:56   DG Chest 2 View  Result Date: 11/12/2023 CLINICAL DATA:  Shortness of breath EXAM: CHEST - 2 VIEW COMPARISON:  10/11/2023 FINDINGS: The heart size and mediastinal contours are within normal limits. Aortic atherosclerosis. Minimal linear left basilar scarring or atelectasis. No focal airspace consolidation, pleural effusion, or pneumothorax. The visualized skeletal structures are unremarkable.  IMPRESSION: No active cardiopulmonary disease. Electronically Signed   By: Duanne Guess D.O.   On: 11/12/2023 18:05     ADDENDUM I have seen the patient, examined him. I agree with the assessment and and plan and have edited the notes.   I reviewed pt's EGD report which was negative for malignancy.  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  I will also request Nex[employer] or sequencing foundation one to see if he is a candidate for targeted therapy or immunotherapy.  Patient previously seen by my partner Dr. Ellin Saba at [facility] for anemia, I will set up for his follow-up with Dr. Ellin Saba when he is ready to be discharged.  I reviewed the above plan with patient and his sister on the phone, and answered all the questions.  Spent a total of 35 minutes for his visit today, more than 50% time on face-to-face counseling.  Malachy Mood MD 12/03/2023

## 2023-12-04 ENCOUNTER — Ambulatory Visit (HOSPITAL_COMMUNITY): Payer: Medicare Other

## 2023-12-04 ENCOUNTER — Encounter (HOSPITAL_COMMUNITY): Payer: Self-pay

## 2023-12-04 DIAGNOSIS — D649 Anemia, unspecified: Secondary | ICD-10-CM | POA: Diagnosis not present

## 2023-12-04 DIAGNOSIS — A419 Sepsis, unspecified organism: Secondary | ICD-10-CM | POA: Diagnosis not present

## 2023-12-04 DIAGNOSIS — N179 Acute kidney failure, unspecified: Secondary | ICD-10-CM | POA: Diagnosis not present

## 2023-12-04 DIAGNOSIS — R571 Hypovolemic shock: Secondary | ICD-10-CM | POA: Diagnosis not present

## 2023-12-04 LAB — BASIC METABOLIC PANEL
Anion gap: 6 (ref 5–15)
BUN: 20 mg/dL (ref 8–23)
CO2: 20 mmol/L — ABNORMAL LOW (ref 22–32)
Calcium: 11.5 mg/dL — ABNORMAL HIGH (ref 8.9–10.3)
Chloride: 103 mmol/L (ref 98–111)
Creatinine, Ser: 1.02 mg/dL (ref 0.61–1.24)
GFR, Estimated: >60 mL/min (ref >60)
Glucose, Bld: 104 mg/dL — ABNORMAL HIGH (ref 70–99)
Potassium: 4.8 mmol/L (ref 3.5–5.1)
Sodium: 129 mmol/L — ABNORMAL LOW (ref 135–145)

## 2023-12-04 LAB — OSMOLALITY: Osmolality: 287 mosm/kg (ref 275–295)

## 2023-12-04 LAB — OSMOLALITY, URINE: Osmolality, Ur: 417 mosm/kg (ref 300–900)

## 2023-12-04 LAB — SODIUM, URINE, RANDOM: Sodium, Ur: 105 mmol/L

## 2023-12-04 MED ORDER — DOCUSATE SODIUM 100 MG PO CAPS
100.0000 mg | ORAL_CAPSULE | Freq: Two times a day (BID) | ORAL | Status: DC
  Administered 2023-12-04 – 2023-12-11 (×11): 100 mg via ORAL
  Filled 2023-12-04 (×14): qty 1

## 2023-12-04 MED ORDER — ZOLEDRONIC ACID 4 MG/100ML IV SOLN
4.0000 mg | Freq: Once | INTRAVENOUS | Status: AC
  Administered 2023-12-04: 4 mg via INTRAVENOUS
  Filled 2023-12-04: qty 100

## 2023-12-04 MED ORDER — FUROSEMIDE 10 MG/ML IJ SOLN
20.0000 mg | Freq: Every day | INTRAMUSCULAR | Status: DC
  Administered 2023-12-04 – 2023-12-09 (×5): 20 mg via INTRAVENOUS
  Filled 2023-12-04 (×6): qty 2

## 2023-12-04 MED ORDER — POLYETHYLENE GLYCOL 3350 17 G PO PACK: 17.0000 g | PACK | Freq: Every day | ORAL | Status: DC | PRN

## 2023-12-04 MED ORDER — SODIUM CHLORIDE 0.9 % IV SOLN: INTRAVENOUS | Status: AC

## 2023-12-04 NOTE — Plan of Care (Signed)
  Problem: Education: Goal: Knowledge of General Education information will improve Description: Including pain rating scale, medication(s)/side effects and non-pharmacologic comfort measures Outcome: Progressing   Problem: Health Behavior/Discharge Planning: Goal: Ability to manage health-related needs will improve Outcome: Progressing   Problem: Clinical Measurements: Goal: Ability to maintain clinical measurements within normal limits will improve Outcome: Progressing Goal: Will remain free from infection Outcome: Progressing Goal: Diagnostic test results will improve Outcome: Progressing Goal: Respiratory complications will improve Outcome: Progressing Goal: Cardiovascular complication will be avoided Outcome: Progressing   Problem: Activity: Goal: Risk for activity intolerance will decrease Outcome: Progressing   Problem: Pain Management: Goal: General experience of comfort will improve Outcome: Progressing   Problem: Elimination: Goal: Will not experience complications related to bowel motility Outcome: Progressing Goal: Will not experience complications related to urinary retention Outcome: Progressing   Problem: Safety: Goal: Ability to remain free from injury will improve Outcome: Progressing   Problem: Health Behavior/Discharge Planning: Goal: Ability to manage health-related needs will improve Outcome: Progressing   Problem: Clinical Measurements: Goal: Ability to maintain clinical measurements within normal limits will improve Outcome: Progressing Goal: Will remain free from infection Outcome: Progressing Goal: Diagnostic test results will improve Outcome: Progressing Goal: Respiratory complications will improve Outcome: Progressing Goal: Cardiovascular complication will be avoided Outcome: Progressing

## 2023-12-04 NOTE — Progress Notes (Addendum)
PROGRESS NOTE    [name]  DGL:[id number] DOB: [birth year] DOA: 11/26/2023 PCP: [facility] Records, FNP   Brief Narrative: [name] is a 72 y.o. male with a history of hypertension, hyperlipidemia, PAD status post right AKA, CKD stage IIIa, GERD, prostate cancer status post prostatectomy.  Patient presented secondary to being found down on the ground and altered and was found to have evidence of hypotension concerning for septic shock with additional evidence of rhabdomyolysis.  Patient started empirically on antibiotics, started on vasopressor support, and IV fluids and was transferred to [facility] from [facility] for ICU admission.  Antibiotics were discontinued secondary to unlikely infectious etiology.  Hypotension and rhabdomyolysis improved with IV fluids and vasopressor support was discontinued.  Admission complicated by the identification of metastatic disease of unknown primary. Calcium worsening.   Assessment and Plan:  Hypovolemic shock Sepsis ruled out. Patient managed in ICU with norepinephrine and fluid resuscitation.   Traumatic rhabdomyolysis Secondary to being down. CK of 6,948 on admission. Associated elevated AST/ALT. Improvement with IV fluids.  Acute anemia Anemia of chronic disease Baseline hemoglobin of 8-9 d/dL. During this admission, hemoglobin drifted down to a low of 6.7 g/dL requiring a transfusion of 1 unit of PRBC on 12/4. Post-transfusion hemoglobin of 8.5 g/dL. Hemoglobin drifted down to 8 g/dL but appears stable.  AKI Baseline creatinine appears to be around 1.3; unclear if patient has CKD stage IIIa vs CKD stage II. Creatinine of 2.21 on admission with improvement to 0.97 prior to discharge.  GERD Continue Protonix.  Metastatic disease Unknown primary. Metastatic disease noted of right  adrenal gland in addition to bony metastasis noted at T5, T6 and T10 with associated pathologic fractures, in addition to  the anterior right fifth rib. Medical oncology consulted. Enlarged right cervical lymph node noted in addition to hilar mass/adenopathy with mediastinal, axillary and right supraclavicular adenopathy on CT. Bedside US guided biopsy of left anterior abdominal wall mass performed on 12/4 which is positive for adenocarcinoma. Upper endoscopy negative for upper GI malignancy.  Central canal narrowing Noted on CT imaging at T10 secondary to extension of metastatic disease. Severe spinal stenosis at T1 and mild stenosis at T10 noted on MRI . No cord compression noted on MRI.  Primary hypertension Antihypertensives held secondary to hypovolemic shock.  Hyponatremia Mild. Slightly worsening. Possibly related to volume overload. -Will check serum osmolality -Check urine osmolality and sodium prior to Lasix  Hyperlipidemia Lipitor held.  Hypercalcemia Correct calcium of 13.5 mg/dL today based on albumin from 12/9. Likely related to metastatic bone disease. Nursing states some intermittent confusion. -Zometa IV x1 -NS IV fluids and Lasix -CMP in AM  PAD History of right AKA. -Continue aspirin.  Age indeterminate left lower extremity DVT Chronic right lower extremity DVT Indeterminate DVT involving the left femoral vein, left popliteal vein, left posterior tibial veins and left peroneal veins. Chronic DVT involving the right femoral vein. In setting of presumed metastatic cancer. Patient started on heparin IV. -Continue Eliquis  Acute right upper extremity DVT Acute right upper extremity SVT Acute DVT involving the right subclavian vein, right axillary vein and right brachial veins. Acute SVT involving the right basilic vein and right cephalic vein -Continue Eliquis  Scrotal cellulitis/candidiasis MRI without deep infection, abscess, or features necrotizing disease. Infection appears to be improving. Blood cultures (12/6) are no growth to date. -Continue Nystatin, Doxycycline and Augmentin  with plan to complete a 7 day course.  Sepsis Unclear is  present on admission, but recurrent criteria met during hospitalization secondary to scrotal infection. Blood cultures obtained. Empiric vancomycin and Cefepime started for treatment. Blood cultures (12/6) are no growth to date.  Phantom limb pain Noted.  Foley catheter in place Placed secondary to scrotal wound and significant edema. -Remove prior to discharge with voiding trial  Moderate malnutrition Dietitian recommendations (12/5): Ensure Plus High Protein po BID, each supplement provides 350 kcal and 20 grams of protein. Multivitamin with minerals   Pressure injury Distal left/medial pretibial area. Present on admission.   DVT prophylaxis: Eliquis Code Status:   Code Status: Full Code Family Communication: None at bedside. Sister on telephone at bedside Disposition Plan: Discharge to SNF pending bed availability and pending workup/management for hypercalcemia.   Consultants:  PCCM Medical oncology Interventional radiology Gastroenterology  Procedures:  12/4: Bedside US guided biopsy of left ant abd wall mass  12/7: Upper GI endoscopy  Antimicrobials: Zosyn Vancomycin Cefepime Doxycycline Nystatin Augmentin   Subjective: No concerns this morning. Feels well.  Objective: BP (!) 101/53 (BP Location: Left Arm)   Pulse 98   Temp 98.1 F (36.7 C) (Oral)   Resp 18   Ht 5\' 10"  (1.778 m)   Wt 94.9 kg   SpO2 93%   BMI 30.02 kg/m   Examination:  General exam: Appears calm and comfortable Respiratory system: Clear to auscultation. Respiratory effort normal. Cardiovascular system: S1 & S2 heard, RRR. No murmurs, rubs, gallops or clicks. Gastrointestinal system: Abdomen is nondistended, soft and nontender. Normal bowel sounds heard. Genitourinary: Penis with edema. Scrotum with improved erythema and tan lesions noted. Central nervous system: Alert and oriented. No focal neurological  deficits. Musculoskeletal: 1+ LLE edema. No calf tenderness Skin: No cyanosis. No rashes Psychiatry: Judgement and insight appear normal. Mood & affect appropriate.    Data Reviewed: I have personally reviewed following labs and imaging studies  CBC Lab Results  Component Value Date   WBC 12.6 (H) 12/02/2023   RBC 2.80 (L) 12/02/2023   HGB 8.0 (L) 12/02/2023   HCT 25.1 (L) 12/02/2023   MCV 89.6 12/02/2023   MCH 28.6 12/02/2023   PLT 293 12/02/2023   MCHC 31.9 12/02/2023   RDW 17.9 (H) 12/02/2023   LYMPHSABS 2.2 11/30/2023   MONOABS 0.9 11/30/2023   EOSABS 0.1 11/30/2023   BASOSABS 0.0 11/30/2023     Last metabolic panel Lab Results  Component Value Date   NA 129 (L) 12/04/2023   K 4.8 12/04/2023   CL 103 12/04/2023   CO2 20 (L) 12/04/2023   BUN 20 12/04/2023   CREATININE 1.02 12/04/2023   GLUCOSE 104 (H) 12/04/2023   GFRNONAA >60 12/04/2023   GFRAA (L) 03/18/2010    60        The eGFR has been calculated using the MDRD equation. This calculation has not been validated in all clinical situations. eGFR's persistently <60 mL/min signify possible Chronic Kidney Disease.   CALCIUM 11.5 (H) 12/04/2023   PHOS 3.2 11/27/2023   PROT 4.7 (L) 11/30/2023   ALBUMIN 1.5 (L) 12/03/2023   LABGLOB 4.2 (H) 10/23/2023   AGRATIO 0.6 (L) 10/23/2023   BILITOT 0.4 11/30/2023   ALKPHOS 72 11/30/2023   AST 58 (H) 11/30/2023   ALT 51 (H) 11/30/2023   ANIONGAP 6 12/04/2023    GFR: Estimated Creatinine Clearance: 74.6 mL/min (by C-G formula based on SCr of 1.02 mg/dL).  Recent Results (from the past 240 hour(s))  Blood culture (routine x 2)     Status: None  Collection Time: 11/26/23 12:13 PM   Specimen: BLOOD  Result Value Ref Range Status   Specimen Description BLOOD BLOOD RIGHT ARM  Final   Special Requests   Final    BOTTLES DRAWN AEROBIC AND ANAEROBIC Blood Culture results may not be optimal due to an inadequate volume of blood received in culture bottles   Culture    Final    NO GROWTH 5 DAYS Performed at [facility], [address] [place], [place] [address]    Report Status 12/01/2023 FINAL  Final  Blood culture (routine x 2)     Status: None   Collection Time: 11/26/23 12:20 PM   Specimen: BLOOD  Result Value Ref Range Status   Specimen Description BLOOD BLOOD RIGHT HAND AEROBIC BOTTLE ONLY  Final   Special Requests   Final    Blood Culture results may not be optimal due to an inadequate volume of blood received in culture bottles BOTTLES DRAWN AEROBIC ONLY   Culture   Final    NO GROWTH 5 DAYS Performed at [facility], [address] [place], [place] [address]    Report Status 12/01/2023 FINAL  Final  MRSA Next Gen by PCR, Nasal     Status: None   Collection Time: 11/26/23  9:02 PM   Specimen: Nasal Mucosa; Nasal Swab  Result Value Ref Range Status   MRSA by PCR Next Gen NOT DETECTED NOT DETECTED Final    Comment: (NOTE) The GeneXpert MRSA Assay (FDA approved for NASAL specimens only), is one component of a comprehensive MRSA colonization surveillance program. It is not intended to diagnose MRSA infection nor to guide or monitor treatment for MRSA infections. Test performance is not FDA approved in patients less than 53 years old. Performed at [facility], [address], [place], [place] [address]   Culture, blood (Routine X 2) w Reflex to ID Panel     Status: None (Preliminary result)   Collection Time: 11/30/23  5:25 AM   Specimen: BLOOD RIGHT ARM  Result Value Ref Range Status   Specimen Description BLOOD RIGHT ARM  Final   Special Requests   Final    BOTTLES DRAWN AEROBIC AND ANAEROBIC Blood Culture results may not be optimal due to an inadequate volume of blood received in culture bottles   Culture   Final    NO GROWTH 4 DAYS Performed at [facility], [address], [place], [place] [address]    Report Status PENDING  Incomplete  Culture, blood (Routine X 2) w Reflex to ID Panel     Status: None (Preliminary  result)   Collection Time: 11/30/23  5:26 AM   Specimen: BLOOD RIGHT ARM  Result Value Ref Range Status   Specimen Description BLOOD RIGHT ARM  Final   Special Requests   Final    BOTTLES DRAWN AEROBIC AND ANAEROBIC Blood Culture results may not be optimal due to an inadequate volume of blood received in culture bottles   Culture   Final    NO GROWTH 4 DAYS Performed at [facility], [address], [place], [place] [address]    Report Status PENDING  Incomplete      Radiology Studies: No results found.    LOS: 8 days    Jacquelin Hawking, MD [facility] 12/04/2023, 8:26 AM   If 7PM-7AM, please contact night-coverage [email or web address]

## 2023-12-04 NOTE — Progress Notes (Signed)
Occupational Therapy Treatment Patient Details Name: [name] MRN: [id number] DOB: Jun 12, 1951 Today's Date: 12/04/2023   History of present illness 72 y.o. male admitted 11/26/23 found down with 2-3 day h/o weakness and fall. Workup for rhabdomyolysis, AKI, septic shock. Imaging with L scalp hematoma. CT findings consistent with metastatic disease (multiple subQ masses, bilateral adrenal masses, R RP masses and multiple osseous metastases) with largest osseous metastasis of T10 vertebral body with cord compression S/p biopsy L abdominal wall mass 12/4. PMH includes prostate CA, PAD, HTN, HLD, R AKA (03/09/23).   OT comments  Pt very fatigued, weak, limits participation and ability to perform bed mobility. Pt max A, max effort, and increased time for supine to sit, sit to supine, not able to assist with LLE or R residual LE, not able to scoot to EOB. Once sitting on EOB Pt frequently loses balance and leans posteriorly, poor sitting balance/endurance. OT/NT assisted Pt back to bed. Pt set up for feeding. RUE swelling has improved, still noticeable to R hand/wrist, able to use at a functional level now for feeding or with gross grip. Pt would benefit from continued acute OT to maximize strength/endurance, postacute rehab <3hrs/day still appropriate.       If plan is discharge home, recommend the following:  Two people to help with walking and/or transfers;Two people to help with bathing/dressing/bathroom;Assistance with cooking/housework;Assistance with feeding;Assist for transportation;Help with stairs or ramp for entrance   Equipment Recommendations  Other (comment) (defer)    Recommendations for Other Services      Precautions / Restrictions Precautions Precautions: Fall;Other (comment) Precaution Comments: h/o R AKA 02/2023 (has prosthetic LE at home); spinal precautions for comfort with mets to spine Restrictions Weight Bearing Restrictions: No RLE Weight Bearing: Weight bearing  as tolerated       Mobility Bed Mobility Overal bed mobility: Needs Assistance Bed Mobility: Supine to Sit, Sit to Supine     Supine to sit: Max assist, HOB elevated, Used rails Sit to supine: Max assist, +2 for physical assistance   General bed mobility comments: max A in/out of bed, Pt not able to assist with scotting to EOB, lifting LLE or R residual LE, nearly total for in/out of bed.    Transfers Overall transfer level: Needs assistance                       Balance Overall balance assessment: Needs assistance Sitting-balance support: Bilateral upper extremity supported, Feet supported Sitting balance-Leahy Scale: Poor Sitting balance - Comments: requires frequent assistance to maintain balance at EOB                                   ADL either performed or assessed with clinical judgement   ADL Overall ADL's : Needs assistance/impaired Eating/Feeding: Set up;Bed level                                     General ADL Comments: set up for feeding, improved use of RUE from decreased swelling    Extremity/Trunk Assessment Upper Extremity Assessment Upper Extremity Assessment: Generalized weakness;RUE deficits/detail RUE Deficits / Details: RUE swelling improved from last week, still noticable to R hand/wrist, able to use RUE to complete feeding with set up            Vision  Perception     Praxis      Cognition Arousal: Alert Behavior During Therapy: WFL for tasks assessed/performed Overall Cognitive Status: No family/caregiver present to determine baseline cognitive functioning                                 General Comments: grossly WFLs, able to fully participate        Exercises      Shoulder Instructions       General Comments      Pertinent Vitals/ Pain       Pain Assessment Pain Assessment: Faces Faces Pain Scale: Hurts little more Pain Location: back Pain Descriptors /  Indicators: Aching, Discomfort, Grimacing Pain Intervention(s): Monitored during session  Home Living                                          Prior Functioning/Environment              Frequency  Min 1X/week        Progress Toward Goals  OT Goals(current goals can now be found in the care plan section)  Progress towards OT goals: Progressing toward goals  Acute Rehab OT Goals Patient Stated Goal: to improve strength OT Goal Formulation: With patient Time For Goal Achievement: 12/13/23 Potential to Achieve Goals: Good ADL Goals Pt Will Perform Upper Body Dressing: with set-up;sitting Pt Will Perform Lower Body Dressing: with set-up;with supervision;sitting/lateral leans Pt Will Transfer to Toilet: with contact guard assist;stand pivot transfer;bedside commode Pt Will Perform Toileting - Clothing Manipulation and hygiene: with set-up;with supervision;sitting/lateral leans Additional ADL Goal #1: Pt will be able to complete all bed mobility with supervision to maximize independence and return to PLOF.  Plan      Co-evaluation                 AM-PAC OT "6 Clicks" Daily Activity     Outcome Measure   Help from another person eating meals?: A Little Help from another person taking care of personal grooming?: A Little Help from another person toileting, which includes using toliet, bedpan, or urinal?: A Lot Help from another person bathing (including washing, rinsing, drying)?: A Lot Help from another person to put on and taking off regular upper body clothing?: A Lot Help from another person to put on and taking off regular lower body clothing?: A Lot 6 Click Score: 14    End of Session    OT Visit Diagnosis: Unsteadiness on feet (R26.81);Repeated falls (R29.6);Muscle weakness (generalized) (M62.81);History of falling (Z91.81);Pain Pain - Right/Left: Left Pain - part of body: Leg   Activity Tolerance Patient tolerated treatment well    Patient Left in bed;with call bell/phone within reach;with nursing/sitter in room   Nurse Communication Mobility status        Time: 1601-0932 OT Time Calculation (min): 41 min  Charges: OT General Charges $OT Visit: 1 Visit OT Treatments $Self Care/Home Management : 8-22 mins $Therapeutic Activity: 23-37 mins  Inita Uram, OTR/L   Rogenia Werntz R Jaicee Michelotti 12/04/2023, 2:05 PM

## 2023-12-04 NOTE — Discharge Instructions (Signed)
Information on my medicine - ELIQUIS (apixaban)  This medication education was reviewed with me or my healthcare representative as part of my discharge preparation.  Why was Eliquis prescribed for you? Eliquis was prescribed to treat blood clots that may have been found in the veins of your legs (deep vein thrombosis) or in your lungs (pulmonary embolism) and to reduce the risk of them occurring again.  What do You need to know about Eliquis ? The starting dose is 10 mg (two 5 mg tablets) taken TWICE daily for the FIRST SEVEN (7) DAYS, then on  December 10, 2023   the dose is reduced to ONE 5 mg tablet taken TWICE daily.  Eliquis may be taken with or without food.   Try to take the dose about the same time in the morning and in the evening. If you have difficulty swallowing the tablet whole please discuss with your pharmacist how to take the medication safely.  Take Eliquis exactly as prescribed and DO NOT stop taking Eliquis without talking to the doctor who prescribed the medication.  Stopping may increase your risk of developing a new blood clot.  Refill your prescription before you run out.  After discharge, you should have regular check-up appointments with your healthcare provider that is prescribing your Eliquis.    What do you do if you miss a dose? If a dose of ELIQUIS is not taken at the scheduled time, take it as soon as possible on the same day and twice-daily administration should be resumed. The dose should not be doubled to make up for a missed dose.  Important Safety Information A possible side effect of Eliquis is bleeding. You should call your healthcare provider right away if you experience any of the following: Bleeding from an injury or your nose that does not stop. Unusual colored urine (red or dark brown) or unusual colored stools (red or black). Unusual bruising for unknown reasons. A serious fall or if you hit your head (even if there is no bleeding).  Some  medicines may interact with Eliquis and might increase your risk of bleeding or clotting while on Eliquis. To help avoid this, consult your healthcare provider or pharmacist prior to using any new prescription or non-prescription medications, including herbals, vitamins, non-steroidal anti-inflammatory drugs (NSAIDs) and supplements.  This website has more information on Eliquis (apixaban): http://www.eliquis.com/eliquis/home

## 2023-12-05 ENCOUNTER — Other Ambulatory Visit: Payer: Self-pay

## 2023-12-05 ENCOUNTER — Telehealth (HOSPITAL_COMMUNITY): Payer: Self-pay | Admitting: Pharmacy Technician

## 2023-12-05 ENCOUNTER — Other Ambulatory Visit (HOSPITAL_COMMUNITY): Payer: Self-pay

## 2023-12-05 DIAGNOSIS — R6521 Severe sepsis with septic shock: Secondary | ICD-10-CM | POA: Diagnosis not present

## 2023-12-05 DIAGNOSIS — A419 Sepsis, unspecified organism: Secondary | ICD-10-CM | POA: Diagnosis not present

## 2023-12-05 LAB — COMPREHENSIVE METABOLIC PANEL
ALT: 67 U/L — ABNORMAL HIGH (ref 0–44)
AST: 43 U/L — ABNORMAL HIGH (ref 15–41)
Albumin: 1.6 g/dL — ABNORMAL LOW (ref 3.5–5.0)
Alkaline Phosphatase: 93 U/L (ref 38–126)
Anion gap: 6 (ref 5–15)
BUN: 21 mg/dL (ref 8–23)
CO2: 22 mmol/L (ref 22–32)
Calcium: 11 mg/dL — ABNORMAL HIGH (ref 8.9–10.3)
Chloride: 103 mmol/L (ref 98–111)
Creatinine, Ser: 0.91 mg/dL (ref 0.61–1.24)
GFR, Estimated: >60 mL/min (ref >60)
Glucose, Bld: 101 mg/dL — ABNORMAL HIGH (ref 70–99)
Potassium: 4.5 mmol/L (ref 3.5–5.1)
Sodium: 131 mmol/L — ABNORMAL LOW (ref 135–145)
Total Bilirubin: 0.4 mg/dL (ref <1.2)
Total Protein: 5.4 g/dL — ABNORMAL LOW (ref 6.5–8.1)

## 2023-12-05 LAB — CULTURE, BLOOD (ROUTINE X 2)
Culture: NO GROWTH
Culture: NO GROWTH

## 2023-12-05 LAB — CBC
HCT: 25.2 % — ABNORMAL LOW (ref 39.0–52.0)
Hemoglobin: 8 g/dL — ABNORMAL LOW (ref 13.0–17.0)
MCH: 28.5 pg (ref 26.0–34.0)
MCHC: 31.7 g/dL (ref 30.0–36.0)
MCV: 89.7 fL (ref 80.0–100.0)
Platelets: 366 10*3/uL (ref 150–400)
RBC: 2.81 MIL/uL — ABNORMAL LOW (ref 4.22–5.81)
RDW: 18.1 % — ABNORMAL HIGH (ref 11.5–15.5)
WBC: 13.1 10*3/uL — ABNORMAL HIGH (ref 4.0–10.5)
nRBC: 0 % (ref 0.0–0.2)

## 2023-12-05 MED ORDER — ENSURE ENLIVE PO LIQD
237.0000 mL | Freq: Every day | ORAL | Status: DC
  Administered 2023-12-05 – 2023-12-10 (×5): 237 mL via ORAL

## 2023-12-05 NOTE — Telephone Encounter (Signed)
Pharmacy Patient Advocate Encounter  Insurance verification completed.    The patient is insured through [facility]. Patient has Medicare and is not eligible for a copay card, but may be able to apply for patient assistance, if available.    Ran test claim for Eliquis 5mg  and the current 30 day co-pay is 47.00.   This test claim was processed through [facility]- copay amounts may vary at other pharmacies due to pharmacy/plan contracts, or as the patient moves through the different stages of their insurance plan.

## 2023-12-05 NOTE — Progress Notes (Signed)
PROGRESS NOTE [name]  XLK:[id number] DOB: [birth year] DOA: 11/26/2023 PCP: [facility] Records, FNP  Brief Narrative/Hospital Course: [name] is a 72 y.o. male with a history of hypertension, hyperlipidemia, PAD status post right AKA, CKD stage IIIa, GERD, prostate cancer status post prostatectomy.  Patient presented secondary to being found down on the ground and altered and was found to have evidence of hypotension concerning for septic shock with additional evidence of rhabdomyolysis.  Patient started empirically on antibiotics, started on vasopressor support, and IV fluids and was transferred to [facility] from [facility] for ICU admission.  Antibiotics were discontinued secondary to unlikely infectious etiology.  Hypotension and rhabdomyolysis improved with IV fluids and vasopressor support was discontinued.  Admission complicated by the identification of metastatic disease of unknown primary. Calcium worsening.    Subjective: Patient seen examined this morningr Resting well  No complaints Overnight afebrile BP stable on room air Labs revealed sodium 131, calcium 11.5> 11, albumin 1.6,  Assessment and Plan: Principal Problem:   Sepsis (HCC) Active Problems:   Normocytic anemia   Hypovolemic shock (HCC)   Rhabdomyolysis   Pressure injury of skin   AKI (acute kidney injury) (HCC)   DVT (deep venous thrombosis) (HCC)   GERD (gastroesophageal reflux disease)   Transaminitis   Malnutrition of moderate degree   Metastatic adenocarcinoma (HCC)   Hypercalcemia Correct calcium still high, related to metastatic bone disease.?  Intermittent confusion, status post Zometa x 1 received IV fluids and Lasix.  Ca improving, recheck in a.m. cont iv lasix. Has leg edema. Recent Labs  Lab 11/29/23 0659 11/30/23 0528 12/03/23 0702 12/04/23 0559 12/05/23 0642  CALCIUM 10.0 9.9 10.7* 11.5* 11.0*    Poorly differentiated adenocarcinoma of unknown  primary History of prostate cancer-f/b Dr Ellin Saba: Soft tissue mass anterior abdominal wall biopsy positive for malignancy as above.  Oncology following. S/p  CT C-spine head/A/P and maxillofacial done on 12/2- shows large lytic lesion T1 likely mets with likely epidural spread, multiple enlarged cervical lymph nodes and neck by adrenal masses multiple bone mets. CT chest 12/3 right hilar mass adenopathy right adrenal mets. Underwent GI eval with EGD 12/7 negative for upper GI malignancy.  Acute DVT right upper extremity: Age indeterminate left lower extremity DVT/Chronic right lower extremity DVT Continue Eliquis  Normocytic anemia: Multifactorial due to malignancy, infectious, CKD.  Transfuse to keep hemoglobin above 7 g Recent Labs  Lab 11/30/23 0528 11/30/23 1817 12/01/23 0522 12/02/23 0703 12/05/23 0642  HGB 7.4* 8.2* 8.1* 8.0* 8.0*  HCT 23.0* 25.5* 25.6* 25.1* 25.2*    Hypovolemic shock Sepsis ruled out. Patient managed in ICU with norepinephrine and fluid resuscitation.  Vitals stable  Traumatic rhabdomyolysis Secondary to being down. CK of 6,948 on admission.  Last CK5 as below.Associated elevated AST/ALT. Improvement with IV fluids.  Recent Labs  Lab 11/29/23 0827  CKTOTAL 529*    AKI Creatinine peaked to 2.2 likely AKI now improved to 0.9 Recent Labs    10/11/23 1025 11/12/23 1519 11/26/23 1033 11/27/23 0859 11/28/23 0324 11/29/23 0659 11/30/23 0528 12/03/23 0702 12/04/23 0559 12/05/23 0642  BUN 28* 37* 97* 60* 43* 29* 24* 21 20 21   CREATININE 1.29* 1.60* 2.21* 1.38* 1.19 0.97 0.97 0.82 1.02 0.91  CO2 24 25 21* 26 27 25 22  20* 20* 22  K 4.1 3.6 4.0 3.7 3.6 3.8 3.9 4.4 4.8 4.5    GERD Continue Protonix.  HTN: Antihypertensives on hold BP stable  Hyponatremia Hypovolemic, encourage  oral intake    Hyperlipidemia Holding Lipitor   PAD right AK: Cont asa    Scrotal cellulitis/candidiasis MRI without deep infection, abscess, or features  necrotizing disease. Infection appears to be improving. Blood cultures (12/6) are no growth to date. Continue Nystatin, Doxycycline and Augmentin with plan to complete a 7 day course.   Sepsis Unclear is present on admission, but recurrent criteria met during hospitalization secondary to scrotal infection. Blood cultures obtained. Empiric vancomycin and Cefepime started for treatment. Blood cultures (12/6) are no growth to date.   Phantom limb pain Noted.   Foley catheter in place Placed secondary to scrotal wound and significant edema. -Remove prior to discharge with voiding trial   Moderate malnutrition Continue to augment nutritional status, RD following Nutrition Problem: Moderate Malnutrition Etiology: acute illness Signs/Symptoms: estimated needs, moderate fat depletion, moderate muscle depletion Interventions: Ensure Enlive (each supplement provides 350kcal and 20 grams of protein), MVI, Liberalize Diet   Class I Obesity: Patient's Body mass index is 30.08 kg/m. : Will benefit with PCP follow-up, weight loss  healthy lifestyle and outpatient sleep evaluation.  Pressure injury.  Pretibial left stage II POA wound care as below Pressure Injury 11/26/23 Pretibial Distal;Left;Medial Stage 2 -  Partial thickness loss of dermis presenting as a shallow open injury with a red, pink wound bed without slough. Edematous excoriated area (Active)  11/26/23 1845  Location: Pretibial  Location Orientation: Distal;Left;Medial  Staging: Stage 2 -  Partial thickness loss of dermis presenting as a shallow open injury with a red, pink wound bed without slough.  Wound Description (Comments): Edematous excoriated area  Present on Admission: Yes  Dressing Type Foam - Lift dressing to assess site every shift 12/04/23 1400    DVT prophylaxis:  Code Status:   Code Status: Full Code Family Communication: plan of care discussed with patient  at bedside. Patient status is: Remains hospitalized because  of severity of illness Level of care: Med-Surg   Dispo: The patient is from: home            Anticipated disposition: SNF once calcium improves Objective: Vitals last 24 hrs: Vitals:   12/04/23 1931 12/05/23 0500 12/05/23 0532 12/05/23 0756  BP: (!) 127/48  (!) 125/50 (!) 107/47  Pulse: 86  (!) 106 (!) 104  Resp: 18  18 18   Temp: 98.1 F (36.7 C)  99.3 F (37.4 C) 98.3 F (36.8 C)  TempSrc: Oral  Oral   SpO2: 93%  97% 94%  Weight:  95.1 kg    Height:       Weight change: 0.2 kg  Physical Examination: General exam: alert awake, oriented  HEENT:Oral mucosa moist, Ear/Nose WNL grossly Respiratory system: Bilaterally clear BS,no use of accessory muscle Cardiovascular system: S1 & S2 +, No JVD. Gastrointestinal system: Abdomen soft,NT,ND, BS+ Nervous System: Alert, awake, moving all extremities,and following commands. Extremities: LE edema ++,distal peripheral pulses palpable and warm.  Skin: No rashes,no icterus. MSK: Normal muscle bulk,tone, power   Medications reviewed:  Scheduled Meds:  amoxicillin-clavulanate  1 tablet Oral Q12H   apixaban  10 mg Oral BID   Followed by   Melene Muller ON 12/10/2023] apixaban  5 mg Oral BID   aspirin  81 mg Oral Daily   Chlorhexidine Gluconate Cloth  6 each Topical Daily   docusate sodium  100 mg Oral BID   doxycycline  100 mg Oral Q12H   feeding supplement  237 mL Oral BID BM   furosemide  20 mg Intravenous Daily  Gerhardt's butt cream   Topical BID   multivitamin with minerals  1 tablet Oral Daily   nystatin   Topical BID   Continuous Infusions:  Diet Order             Diet regular Room service appropriate? Yes; Fluid consistency: Thin  Diet effective now                   Intake/Output Summary (Last 24 hours) at 12/05/2023 0831 Last data filed at 12/05/2023 1610 Gross per 24 hour  Intake 1810.94 ml  Output 1650 ml  Net 160.94 ml   Net IO Since Admission: 2,550.23 mL [12/05/23 0831]  Wt Readings from Last 3  Encounters:  12/05/23 95.1 kg  11/12/23 96.6 kg  10/31/23 98.9 kg     Unresulted Labs (From admission, onward)    None     Data Reviewed: I have personally reviewed following labs and imaging studies CBC: Recent Labs  Lab 11/29/23 0659 11/30/23 0528 11/30/23 1817 12/01/23 0522 12/02/23 0703 12/05/23 0642  WBC 10.7* 12.5*  --  14.9* 12.6* 13.1*  NEUTROABS  --  9.0*  --   --   --   --   HGB 8.4* 7.4* 8.2* 8.1* 8.0* 8.0*  HCT 26.1* 23.0* 25.5* 25.6* 25.1* 25.2*  MCV 86.4 87.8  --  86.5 89.6 89.7  PLT 257 256  --  302 293 366   Basic Metabolic Panel:  Recent Labs  Lab 11/29/23 0659 11/30/23 0528 12/03/23 0702 12/04/23 0559 12/05/23 0642  NA 134* 133* 130* 129* 131*  K 3.8 3.9 4.4 4.8 4.5  CL 102 104 103 103 103  CO2 25 22 20* 20* 22  GLUCOSE 98 128* 110* 104* 101*  BUN 29* 24* 21 20 21   CREATININE 0.97 0.97 0.82 1.02 0.91  CALCIUM 10.0 9.9 10.7* 11.5* 11.0*   GFR: Estimated Creatinine Clearance: 83.6 mL/min (by C-G formula based on SCr of 0.91 mg/dL). Liver Function Tests:  Recent Labs  Lab 11/30/23 0528 12/03/23 0702 12/05/23 0642  AST 58*  --  43*  ALT 51*  --  67*  ALKPHOS 72  --  93  BILITOT 0.4  --  0.4  PROT 4.7*  --  5.4*  ALBUMIN <1.5* 1.5* 1.6*  Sepsis Labs: No results for input(s): "PROCALCITON", "LATICACIDVEN" in the last 168 hours. Recent Results (from the past 240 hour(s))  Blood culture (routine x 2)     Status: None   Collection Time: 11/26/23 12:13 PM   Specimen: BLOOD  Result Value Ref Range Status   Specimen Description BLOOD BLOOD RIGHT ARM  Final   Special Requests   Final    BOTTLES DRAWN AEROBIC AND ANAEROBIC Blood Culture results may not be optimal due to an inadequate volume of blood received in culture bottles   Culture   Final    NO GROWTH 5 DAYS Performed at [facility], [address], [place], [place] [address]    Report Status 12/01/2023 FINAL  Final  Blood culture (routine x 2)     Status: None   Collection Time:  11/26/23 12:20 PM   Specimen: BLOOD  Result Value Ref Range Status   Specimen Description BLOOD BLOOD RIGHT HAND AEROBIC BOTTLE ONLY  Final   Special Requests   Final    Blood Culture results may not be optimal due to an inadequate volume of blood received in culture bottles BOTTLES DRAWN AEROBIC ONLY   Culture   Final  NO GROWTH 5 DAYS Performed at [facility], [address], [place], [place] [address]    Report Status 12/01/2023 FINAL  Final  MRSA Next Gen by PCR, Nasal     Status: None   Collection Time: 11/26/23  9:02 PM   Specimen: Nasal Mucosa; Nasal Swab  Result Value Ref Range Status   MRSA by PCR Next Gen NOT DETECTED NOT DETECTED Final    Comment: (NOTE) The GeneXpert MRSA Assay (FDA approved for NASAL specimens only), is one component of a comprehensive MRSA colonization surveillance program. It is not intended to diagnose MRSA infection nor to guide or monitor treatment for MRSA infections. Test performance is not FDA approved in patients less than 61 years old. Performed at [facility], [address] [place], [place] [address]   Culture, blood (Routine X 2) w Reflex to ID Panel     Status: None (Preliminary result)   Collection Time: 11/30/23  5:25 AM   Specimen: BLOOD RIGHT ARM  Result Value Ref Range Status   Specimen Description BLOOD RIGHT ARM  Final   Special Requests   Final    BOTTLES DRAWN AEROBIC AND ANAEROBIC Blood Culture results may not be optimal due to an inadequate volume of blood received in culture bottles   Culture   Final    NO GROWTH 4 DAYS Performed at [facility], [address], [place], [place] [address]    Report Status PENDING  Incomplete  Culture, blood (Routine X 2) w Reflex to ID Panel     Status: None (Preliminary result)   Collection Time: 11/30/23  5:26 AM   Specimen: BLOOD RIGHT ARM  Result Value Ref Range Status   Specimen Description BLOOD RIGHT ARM  Final   Special Requests   Final    BOTTLES DRAWN AEROBIC  AND ANAEROBIC Blood Culture results may not be optimal due to an inadequate volume of blood received in culture bottles   Culture   Final    NO GROWTH 4 DAYS Performed at [facility], [address], [place], [place] [address]    Report Status PENDING  Incomplete    Antimicrobials/Microbiology: Anti-infectives (From admission, onward)    Start     Dose/Rate Route Frequency Ordered Stop   12/03/23 1245  doxycycline (VIBRA-TABS) tablet 100 mg        100 mg Oral Every 12 hours 12/03/23 1155     12/03/23 1245  amoxicillin-clavulanate (AUGMENTIN) 875-125 MG per tablet 1 tablet        1 tablet Oral Every 12 hours 12/03/23 1155     11/30/23 1800  vancomycin (VANCOREADY) IVPB 750 mg/150 mL  Status:  Discontinued        750 mg 150 mL/hr over 60 Minutes Intravenous Every 12 hours 11/30/23 0234 12/03/23 1155   11/30/23 0430  vancomycin (VANCOCIN) IVPB 1000 mg/200 mL premix       Placed in "Followed by" Linked Group   1,000 mg 200 mL/hr over 60 Minutes Intravenous  Once 11/30/23 0237 11/30/23 0659   11/30/23 0400  ceFEPIme (MAXIPIME) 2 g in sodium chloride 0.9 % 100 mL IVPB  Status:  Discontinued        2 g 200 mL/hr over 30 Minutes Intravenous Every 8 hours 11/30/23 0234 12/03/23 1155   11/30/23 0330  vancomycin (VANCOREADY) IVPB 2000 mg/400 mL  Status:  Discontinued        2,000 mg 200 mL/hr over 120 Minutes Intravenous  Once  11/30/23 0234 11/30/23 0236   11/30/23 0330  vancomycin (VANCOCIN) IVPB 1000 mg/200 mL premix       Placed in "Followed by" Linked Group   1,000 mg 200 mL/hr over 60 Minutes Intravenous  Once 11/30/23 0237 11/30/23 0558   11/28/23 1300  vancomycin (VANCOREADY) IVPB 1500 mg/300 mL  Status:  Discontinued        1,500 mg 150 mL/hr over 120 Minutes Intravenous Every 48 hours 11/26/23 1301 11/27/23 1107   11/26/23 2000  piperacillin-tazobactam (ZOSYN) IVPB 3.375 g  Status:  Discontinued        3.375 g 12.5 mL/hr over 240 Minutes Intravenous Every 8 hours 11/26/23  1850 11/28/23 0921   11/26/23 1330  vancomycin (VANCOCIN) IVPB 1000 mg/200 mL premix        1,000 mg 200 mL/hr over 60 Minutes Intravenous Every 1 hr x 2 11/26/23 1251 11/26/23 1617   11/26/23 1245  ceFEPIme (MAXIPIME) 2 g in sodium chloride 0.9 % 100 mL IVPB        2 g 200 mL/hr over 30 Minutes Intravenous  Once 11/26/23 1243 11/26/23 1420         Component Value Date/Time   SDES BLOOD RIGHT ARM 11/30/2023 0526   SPECREQUEST  11/30/2023 0526    BOTTLES DRAWN AEROBIC AND ANAEROBIC Blood Culture results may not be optimal due to an inadequate volume of blood received in culture bottles   CULT  11/30/2023 0526    NO GROWTH 4 DAYS Performed at [facility], [address] [place], [place] [address]    REPTSTATUS PENDING 11/30/2023 4782     Radiology Studies: No results found.   LOS: 9 days   Total time spent in review of labs and imaging, patient evaluation, formulation of plan, documentation and communication with family: 35 minutes  Lanae Boast, MD [facility]  12/05/2023, 8:31 AM

## 2023-12-05 NOTE — Progress Notes (Signed)
[facility] & Cancer type ID ordered on Case#[id number] and submitted electronically.  Confirmation received from both [facility] and Cancer Type ID.

## 2023-12-05 NOTE — Progress Notes (Signed)
Nutrition Follow-up  DOCUMENTATION CODES:   Non-severe (moderate) malnutrition in context of acute illness/injury, Obesity unspecified  INTERVENTION:  Continue with regular diet Change Ensure Plus High Protein po BID, each supplement provides 350 kcal and 20 grams of protein. To one time at night.  Continue with multivitamin with minerals   NUTRITION DIAGNOSIS:   Moderate Malnutrition related to acute illness as evidenced by estimated needs, moderate fat depletion, moderate muscle depletion.  On going with intervention in place  GOAL:   Patient will meet greater than or equal to 90% of their needs    MONITOR:   PO intake, Supplement acceptance  REASON FOR ASSESSMENT:   Consult Assessment of nutrition requirement/status, Calorie Count  ASSESSMENT:   72 y.o. M presented to [facility] 12/2 as transfer from [facility] for septic shock and rhabdomyolysis. Presented to [facility] 11/19 for SOB, anemia. PMH;  HTN, GERd, HLD, Prostate cancer, Umbilical hernia, PAD, Metastatic disease, Right AKA, septic shock. Pt  in bed in a somewhat reclined position., with lunch tray in front of him.  Spoon at top of tray out of reach. Appeared to not have eaten anything although unsure as to how long his tray was setting there.  Soup appeared to have some warmth to it. Pt breathing appeared very labored as he spoke with me.  He stated that he is really trying to eat but he is unable to eat as much as he use to. Further states that his sister keeps telling him to eat and that she just doesn't understand. He reported that he tries to drink ensure but they fill him up and today he asked not to get one. Offered to try at bed time and he was agreeable to this.  RD helped pt with spoon and set him up straighter to better reach food easier.  Hospital weight history:  12/05/23 0500 95.1 kg 209.66 lbs  12/04/23 0452 94.9 kg 209.22 lbs  12/03/23 04:30:01 94.6 kg 208.56 lbs  12/02/23 0500 96 kg 211.64 lbs  12/01/23 0909 97.2  kg 214.29 lbs  12/01/23 0500 97.2 kg 214.29 lbs  11/30/23 0421 96.6 kg 212.96 lbs  11/29/23 0500 95.9 kg 211.42 lbs  11/28/23 0501 92.4 kg 203.71 lbs  11/27/23 1135 91.4 kg 201.5 lbs  11/27/23 0500 91.4 kg 201.5 lbs  11/26/23 1922 91.4 kg 201.5 lbs  11/26/23 1849 89.3 kg 196.87 lbs      Average Meal Intake: 25-70: 48% intake x 8 recorded meals  Nutritionally Relevant Medications: Scheduled Meds:  apixaban  10 mg Oral BID   docusate sodium  100 mg Oral BID   feeding supplement  237 mL Oral BID BM   furosemide  20 mg Intravenous Daily   multivitamin with minerals  1 tablet Oral Daily     Labs Reviewed    NUTRITION - FOCUSED PHYSICAL EXAM:  Flowsheet Row Most Recent Value  Orbital Region Moderate depletion  Upper Arm Region Mild depletion  Thoracic and Lumbar Region No depletion  Buccal Region Moderate depletion  Temple Region Moderate depletion  Clavicle Bone Region Mild depletion  Clavicle and Acromion Bone Region No depletion  Dorsal Hand Unable to assess  Patellar Region No depletion  Anterior Thigh Region No depletion  Posterior Calf Region No depletion  Edema (RD Assessment) Moderate  Hair Reviewed  Eyes Reviewed  Mouth Reviewed  Skin Reviewed  Nails Reviewed       Diet Order:   Diet Order  Diet regular Room service appropriate? Yes; Fluid consistency: Thin  Diet effective now                   EDUCATION NEEDS:   Education needs have been addressed  Skin:  Skin Assessment: Reviewed RN Assessment Skin Integrity Issues:: Other (Comment), Stage II, Incisions Stage II: Pretibial distal left medial Incisions: right thigh Other: MASD, scrotum bilateral, Incontinence associated dermtitis perineum bilateral ulcerations  Last BM:  12/04/23  Height:   Ht Readings from Last 1 Encounters:  12/01/23 5\' 10"  (1.778 m)    Weight:   Wt Readings from Last 1 Encounters:  12/05/23 95.1 kg    Ideal Body Weight:     BMI:  Body mass  index is 30.08 kg/m.  Estimated Nutritional Needs:   Kcal:  2300-2650 kcal/d  Protein:  100-115 g/d  Fluid:  33ml/kcal    Jamelle Haring RDN, LDN Clinical Dietitian  Pleas see Amion for contact information

## 2023-12-05 NOTE — Plan of Care (Signed)
  Problem: Education: Goal: Knowledge of General Education information will improve Description: Including pain rating scale, medication(s)/side effects and non-pharmacologic comfort measures Outcome: Progressing   Problem: Health Behavior/Discharge Planning: Goal: Ability to manage health-related needs will improve Outcome: Progressing   Problem: Clinical Measurements: Goal: Ability to maintain clinical measurements within normal limits will improve Outcome: Progressing Goal: Will remain free from infection Outcome: Progressing Goal: Diagnostic test results will improve Outcome: Progressing Goal: Respiratory complications will improve Outcome: Progressing Goal: Cardiovascular complication will be avoided Outcome: Progressing   Problem: Activity: Goal: Risk for activity intolerance will decrease Outcome: Progressing   Problem: Nutrition: Goal: Adequate nutrition will be maintained Outcome: Progressing   Problem: Coping: Goal: Level of anxiety will decrease Outcome: Progressing   Problem: Elimination: Goal: Will not experience complications related to bowel motility Outcome: Progressing Goal: Will not experience complications related to urinary retention Outcome: Progressing   Problem: Pain Management: Goal: General experience of comfort will improve Outcome: Progressing   Problem: Safety: Goal: Ability to remain free from injury will improve Outcome: Progressing   Problem: Skin Integrity: Goal: Risk for impaired skin integrity will decrease Outcome: Progressing   Problem: Education: Goal: Knowledge of General Education information will improve Description: Including pain rating scale, medication(s)/side effects and non-pharmacologic comfort measures Outcome: Progressing   Problem: Health Behavior/Discharge Planning: Goal: Ability to manage health-related needs will improve Outcome: Progressing   Problem: Clinical Measurements: Goal: Ability to maintain  clinical measurements within normal limits will improve Outcome: Progressing Goal: Will remain free from infection Outcome: Progressing Goal: Diagnostic test results will improve Outcome: Progressing Goal: Respiratory complications will improve Outcome: Progressing Goal: Cardiovascular complication will be avoided Outcome: Progressing   Problem: Activity: Goal: Risk for activity intolerance will decrease Outcome: Progressing   Problem: Nutrition: Goal: Adequate nutrition will be maintained Outcome: Progressing   Problem: Coping: Goal: Level of anxiety will decrease Outcome: Progressing   Problem: Elimination: Goal: Will not experience complications related to bowel motility Outcome: Progressing Goal: Will not experience complications related to urinary retention Outcome: Progressing   Problem: Pain Management: Goal: General experience of comfort will improve Outcome: Progressing   Problem: Safety: Goal: Ability to remain free from injury will improve Outcome: Progressing   Problem: Skin Integrity: Goal: Risk for impaired skin integrity will decrease Outcome: Progressing   Problem: Fluid Volume: Goal: Hemodynamic stability will improve Outcome: Progressing   Problem: Clinical Measurements: Goal: Diagnostic test results will improve Outcome: Progressing Goal: Signs and symptoms of infection will decrease Outcome: Progressing   Problem: Respiratory: Goal: Ability to maintain adequate ventilation will improve Outcome: Progressing

## 2023-12-05 NOTE — Plan of Care (Signed)
  Problem: Clinical Measurements: Goal: Ability to maintain clinical measurements within normal limits will improve Outcome: Progressing   Problem: Clinical Measurements: Goal: Will remain free from infection Outcome: Progressing   Problem: Clinical Measurements: Goal: Diagnostic test results will improve Outcome: Progressing   Problem: Clinical Measurements: Goal: Respiratory complications will improve Outcome: Progressing   

## 2023-12-05 NOTE — Progress Notes (Signed)
Physical Therapy Treatment Patient Details Name: [name] MRN: [id number] DOB: [birth year] Today's Date: 12/05/2023   History of Present Illness 72 y.o. male admitted 11/26/23 found down with 2-3 day h/o weakness and fall. Workup for rhabdomyolysis, AKI, septic shock. Imaging with L scalp hematoma. CT findings consistent with metastatic disease (multiple subQ masses, bilateral adrenal masses, R RP masses and multiple osseous metastases) with largest osseous metastasis of T10 vertebral body with cord compression S/p biopsy L abdominal wall mass 12/4. PMH includes prostate CA, PAD, HTN, HLD, R AKA (03/09/23).    PT Comments  Pt received in supine and agreeable to session. Pt demonstrates poor initiation of movement and pt becomes emotional stating "it won't move" referring to his LE's during bed mobility. Pt requires max-max A+2 for all mobility tasks due to weakness and impaired balance. Pt demonstrates posterior and R lateral leans sitting EOB requiring frequent assist to maintain balance. Pt able to perform LAQ with LLE after initial AAROM due to difficulty initiating movement. Pt able to perform anterior leans requiring dense cues to focus on increasing core strength. Pt limited by quick fatigue. Pt continues to benefit from PT services to progress toward functional mobility goals.     If plan is discharge home, recommend the following: Two people to help with walking and/or transfers;Two people to help with bathing/dressing/bathroom   Can travel by private vehicle     No  Equipment Recommendations  Other (comment) (TBD)    Recommendations for Other Services       Precautions / Restrictions Precautions Precautions: Fall;Other (comment) Precaution Comments: h/o R AKA 02/2023 (has prosthetic LE at home); spinal precautions for comfort with mets to spine Restrictions Weight Bearing Restrictions: No     Mobility  Bed Mobility Overal bed mobility: Needs Assistance Bed Mobility:  Supine to Sit, Sit to Supine     Supine to sit: Max assist, HOB elevated, Used rails Sit to supine: Max assist, +2 for physical assistance   General bed mobility comments: Max A for all aspects of bed mobility despite pt's effort and step-by-step cues. Poor initiation of movement and BLE weakness.    Transfers                   General transfer comment: deferred due to poor sitting balance. Pt requires total A +2 to lateral scoot towards HOB       Balance Overall balance assessment: Needs assistance Sitting-balance support: Bilateral upper extremity supported, Feet supported Sitting balance-Leahy Scale: Poor Sitting balance - Comments: Sitting EOB with brief CGA-max A to maintain balance due to posterior and R lateral leans. Pt able to improve anterior lean with dense cues                                    Cognition Arousal: Alert Behavior During Therapy: WFL for tasks assessed/performed Overall Cognitive Status: No family/caregiver present to determine baseline cognitive functioning                                          Exercises General Exercises - Lower Extremity Long Arc Quad: AROM, AAROM, Seated, Left, 10 reps Other Exercises Other Exercises: anterior leans sitting EOB x5    General Comments        Pertinent Vitals/Pain Pain Assessment Pain Assessment: Faces Faces  Pain Scale: Hurts a little bit Pain Location: back Pain Descriptors / Indicators: Aching, Discomfort, Grimacing Pain Intervention(s): Monitored during session, Limited activity within patient's tolerance, Repositioned     PT Goals (current goals can now be found in the care plan section) Acute Rehab PT Goals Patient Stated Goal: willing to consider post-acute rehab at SNF PT Goal Formulation: With patient Time For Goal Achievement: 12/13/23 Progress towards PT goals: Progressing toward goals    Frequency    Min 1X/week       AM-PAC PT "6  Clicks" Mobility   Outcome Measure  Help needed turning from your back to your side while in a flat bed without using bedrails?: A Lot Help needed moving from lying on your back to sitting on the side of a flat bed without using bedrails?: Total Help needed moving to and from a bed to a chair (including a wheelchair)?: Total Help needed standing up from a chair using your arms (e.g., wheelchair or bedside chair)?: Total Help needed to walk in hospital room?: Total Help needed climbing 3-5 steps with a railing? : Total 6 Click Score: 7    End of Session   Activity Tolerance: Patient limited by fatigue Patient left: in bed;with call bell/phone within reach;with bed alarm set Nurse Communication: Mobility status PT Visit Diagnosis: Other abnormalities of gait and mobility (R26.89);Muscle weakness (generalized) (M62.81);Pain     Time: 8295-6213 PT Time Calculation (min) (ACUTE ONLY): 24 min  Charges:    $Therapeutic Exercise: 8-22 mins $Therapeutic Activity: 8-22 mins PT General Charges $$ ACUTE PT VISIT: 1 Visit                    Johny Shock, PTA Acute Rehabilitation Services Secure Chat Preferred  [phone]    Johny Shock 12/05/2023, 12:20 PM

## 2023-12-06 ENCOUNTER — Inpatient Hospital Stay: Payer: Medicare Other | Admitting: Hematology

## 2023-12-06 DIAGNOSIS — A419 Sepsis, unspecified organism: Secondary | ICD-10-CM | POA: Diagnosis not present

## 2023-12-06 DIAGNOSIS — R6521 Severe sepsis with septic shock: Secondary | ICD-10-CM | POA: Diagnosis not present

## 2023-12-06 LAB — COMPREHENSIVE METABOLIC PANEL
ALT: 75 U/L — ABNORMAL HIGH (ref 0–44)
AST: 57 U/L — ABNORMAL HIGH (ref 15–41)
Albumin: 1.5 g/dL — ABNORMAL LOW (ref 3.5–5.0)
Alkaline Phosphatase: 100 U/L (ref 38–126)
Anion gap: 5 (ref 5–15)
BUN: 26 mg/dL — ABNORMAL HIGH (ref 8–23)
CO2: 23 mmol/L (ref 22–32)
Calcium: 10.4 mg/dL — ABNORMAL HIGH (ref 8.9–10.3)
Chloride: 102 mmol/L (ref 98–111)
Creatinine, Ser: 0.9 mg/dL (ref 0.61–1.24)
GFR, Estimated: >60 mL/min (ref >60)
Glucose, Bld: 108 mg/dL — ABNORMAL HIGH (ref 70–99)
Potassium: 4.7 mmol/L (ref 3.5–5.1)
Sodium: 130 mmol/L — ABNORMAL LOW (ref 135–145)
Total Bilirubin: 0.5 mg/dL (ref <1.2)
Total Protein: 5.4 g/dL — ABNORMAL LOW (ref 6.5–8.1)

## 2023-12-06 LAB — CBC
HCT: 23 % — ABNORMAL LOW (ref 39.0–52.0)
Hemoglobin: 7.3 g/dL — ABNORMAL LOW (ref 13.0–17.0)
MCH: 28.2 pg (ref 26.0–34.0)
MCHC: 31.7 g/dL (ref 30.0–36.0)
MCV: 88.8 fL (ref 80.0–100.0)
Platelets: 390 10*3/uL (ref 150–400)
RBC: 2.59 MIL/uL — ABNORMAL LOW (ref 4.22–5.81)
RDW: 18 % — ABNORMAL HIGH (ref 11.5–15.5)
WBC: 13.4 10*3/uL — ABNORMAL HIGH (ref 4.0–10.5)
nRBC: 0 % (ref 0.0–0.2)

## 2023-12-06 LAB — PHOSPHORUS
Phosphorus: 1.5 mg/dL — ABNORMAL LOW (ref 2.5–4.6)
Phosphorus: 3.1 mg/dL (ref 2.5–4.6)

## 2023-12-06 LAB — MAGNESIUM: Magnesium: 2.1 mg/dL (ref 1.7–2.4)

## 2023-12-06 LAB — GLUCOSE, CAPILLARY
Glucose-Capillary: 102 mg/dL — ABNORMAL HIGH (ref 70–99)
Glucose-Capillary: 127 mg/dL — ABNORMAL HIGH (ref 70–99)

## 2023-12-06 MED ORDER — SODIUM PHOSPHATES 45 MMOLE/15ML IV SOLN
45.0000 mmol | Freq: Once | INTRAVENOUS | Status: AC
  Administered 2023-12-06: 45 mmol via INTRAVENOUS
  Filled 2023-12-06: qty 15

## 2023-12-06 NOTE — Progress Notes (Signed)
PROGRESS NOTE ADHRIT CACCAVALE  AOZ:[id number] DOB: 08-22-1951 DOA: 11/26/2023 PCP: Rica Records, FNP  Brief Narrative/Hospital Course: DALVON CENTNER is a 72 y.o. male with a history of hypertension, hyperlipidemia, PAD status post right AKA, CKD stage IIIa, GERD, prostate cancer status post prostatectomy.  Patient presented secondary to being found down on the ground and altered and was found to have evidence of hypotension concerning for septic shock with additional evidence of rhabdomyolysis.  Patient started empirically on antibiotics, started on vasopressor support, and IV fluids and was transferred to [facility] from [facility] for ICU admission.  Antibiotics were discontinued secondary to unlikely infectious etiology.  Hypotension and rhabdomyolysis improved with IV fluids and vasopressor support was discontinued.  Admission complicated by the identification of metastatic disease of unknown primary. Calcium worsening.      Subjective:  Patient seen and examined this morning overall afebrile no nausea vomiting fever chills  No new complaints  Labs with improving calcium but low Phos   Assessment and Plan: Principal Problem:   Sepsis (HCC) Active Problems:   Normocytic anemia   Hypovolemic shock (HCC)   Rhabdomyolysis   Pressure injury of skin   AKI (acute kidney injury) (HCC)   DVT (deep venous thrombosis) (HCC)   GERD (gastroesophageal reflux disease)   Transaminitis   Malnutrition of moderate degree   Metastatic adenocarcinoma (HCC)  Hypercalcemia of malignancy: Calcium is still high but improving nicely, has low albumin, likely related to metastatic bone disease?Intermittent confusion but currently mental status stable.  S/P Zometa x 1 12/10. Cont iv Lasix.  Monitor labs.   Recent Labs  Lab 11/30/23 0528 12/03/23 0702 12/04/23 0559 12/05/23 0642 12/06/23 0614  CALCIUM 9.9 10.7* 11.5* 11.0* 10.4*    Hypophosphatemia: Replace  aggressively.  Poorly differentiated adenocarcinoma of unknown primary History of prostate cancer-f/b Dr Ellin Saba: Soft tissue mass anterior abdominal wall biopsy positive for malignancy as above.  Oncology following. S/p  CT C-spine head/A/P and maxillofacial done on 12/2- shows large lytic lesion T1 likely mets with likely epidural spread, multiple enlarged cervical lymph nodes and neck by adrenal masses multiple bone mets. CT chest 12/3 right hilar mass adenopathy right adrenal mets. Underwent GI eval with EGD 12/7 negative for upper GI malignancy.  Acute DVT right upper extremity: Age indeterminate left lower extremity DVT/Chronic right lower extremity DVT Continue Eliquis  Normocytic anemia: Multifactorial due to malignancy, infectious, CKD.  Transfuse to keep hemoglobin above 7 g Recent Labs  Lab 11/30/23 1817 12/01/23 0522 12/02/23 0703 12/05/23 0642 12/06/23 0614  HGB 8.2* 8.1* 8.0* 8.0* 7.3*  HCT 25.5* 25.6* 25.1* 25.2* 23.0*    Hypovolemic shock Sepsis ruled out. Patient managed in ICU with norepinephrine and fluid resuscitation.  Vitals stable  Traumatic rhabdomyolysis Secondary to being down. CK of 6,948 on admission.Last CK5 as below.Associated elevated AST/ALT. Improvement with IV fluids.  Recent Labs  Lab 11/29/23 0827  CKTOTAL 529*    AKI Creatinine peaked to 2.2 likely AKI now improved to 0.9 Recent Labs    11/12/23 1519 11/26/23 1033 11/27/23 0859 11/28/23 0324 11/29/23 0659 11/30/23 0528 12/03/23 0702 12/04/23 0559 12/05/23 0642 12/06/23 0614  BUN 37* 97* 60* 43* 29* 24* 21 20 21  26*  CREATININE 1.60* 2.21* 1.38* 1.19 0.97 0.97 0.82 1.02 0.91 0.90  CO2 25 21* 26 27 25 22  20* 20* 22 23  K 3.6 4.0 3.7 3.6 3.8 3.9 4.4 4.8 4.5 4.7    GERD Continue Protonix.  HTN: Antihypertensives  on hold BP stable  Hyponatremia Hypovolemic, encourage oral intake    Hyperlipidemia Holding Lipitor   PAD right AKA: Cont asa    Scrotal  cellulitis/candidiasis MRI without deep infection, abscess, or features necrotizing disease. Infection appears to be improving. Blood cultures (12/6) are no growth to date. Continue Nystatin, Doxycycline and Augmentin with plan to complete a 7 day course.   Sepsis Unclear is present on admission, but recurrent criteria met during hospitalization secondary to scrotal infection. Blood cultures obtained. Empiric vancomycin and Cefepime started for treatment. Blood cultures (12/6) are no growth to date.   Phantom limb pain Noted.   Foley catheter in place Placed secondary to scrotal wound and significant edema. -Remove prior to discharge with voiding trial   Moderate malnutrition Continue to augment nutritional status, RD following Nutrition Problem: Moderate Malnutrition Etiology: acute illness Signs/Symptoms: estimated needs, moderate fat depletion, moderate muscle depletion Interventions: Ensure Enlive (each supplement provides 350kcal and 20 grams of protein), MVI, Liberalize Diet   Class I Obesity: Patient's Body mass index is 29.29 kg/m. : Will benefit with PCP follow-up, weight loss  healthy lifestyle and outpatient sleep evaluation.  Pressure injury.  Pretibial left stage II POA wound care as below Pressure Injury 11/26/23 Pretibial Distal;Left;Medial Stage 2 -  Partial thickness loss of dermis presenting as a shallow open injury with a red, pink wound bed without slough. Edematous excoriated area (Active)  11/26/23 1845  Location: Pretibial  Location Orientation: Distal;Left;Medial  Staging: Stage 2 -  Partial thickness loss of dermis presenting as a shallow open injury with a red, pink wound bed without slough.  Wound Description (Comments): Edematous excoriated area  Present on Admission: Yes  Dressing Type Foam - Lift dressing to assess site every shift 12/04/23 1400    DVT prophylaxis:  Code Status:   Code Status: Full Code Family Communication: plan of care discussed  with patient  at bedside. Patient status is: Remains hospitalized because of severity of illness Level of care: Med-Surg   Dispo: The patient is from: home.  Sister is helping with decision for SNF            Anticipated disposition: SNF next 24 hours once calcium and electrolytes improves Objective: Vitals last 24 hrs: Vitals:   12/05/23 2006 12/06/23 0500 12/06/23 0526 12/06/23 0740  BP: (!) 104/44  (!) 108/48 (!) 112/49  Pulse: (!) 112  (!) 112 (!) 107  Resp:    16  Temp: 99.6 F (37.6 C)  99.1 F (37.3 C) 98 F (36.7 C)  TempSrc: Oral  Oral   SpO2: 91%  91% 91%  Weight:  92.6 kg    Height:       Weight change: -2.5 kg  Physical Examination: General exam: alert awake, oriented at baseline, older than stated age HEENT:Oral mucosa moist, Ear/Nose WNL grossly Respiratory system: Bilaterally clear BS,no use of accessory muscle Cardiovascular system: S1 & S2 +, No JVD. Gastrointestinal system: Abdomen soft,NT,ND, BS+ Nervous System: Alert, awake, moving all extremities,and following commands. Extremities: LE edema + on LLE rt aka ,distal peripheral pulses palpable and warm.  Skin: No rashes,no icterus. MSK: Normal muscle bulk,tone, power  MSK: Normal muscle bulk,tone, power   Medications reviewed:  Scheduled Meds:  amoxicillin-clavulanate  1 tablet Oral Q12H   apixaban  10 mg Oral BID   Followed by   Melene Muller ON 12/10/2023] apixaban  5 mg Oral BID   aspirin  81 mg Oral Daily   Chlorhexidine Gluconate Cloth  6 each  Topical Daily   docusate sodium  100 mg Oral BID   doxycycline  100 mg Oral Q12H   feeding supplement  237 mL Oral QHS   furosemide  20 mg Intravenous Daily   Gerhardt's butt cream   Topical BID   multivitamin with minerals  1 tablet Oral Daily   nystatin   Topical BID   Continuous Infusions:  Diet Order             Diet regular Room service appropriate? Yes; Fluid consistency: Thin  Diet effective now                   Intake/Output Summary  (Last 24 hours) at 12/06/2023 0820 Last data filed at 12/06/2023 0600 Gross per 24 hour  Intake --  Output 1850 ml  Net -1850 ml   Net IO Since Admission: 700.23 mL [12/06/23 0820]  Wt Readings from Last 3 Encounters:  12/06/23 92.6 kg  11/12/23 96.6 kg  10/31/23 98.9 kg     Unresulted Labs (From admission, onward)     Start     Ordered   12/06/23 0500  Comprehensive metabolic panel  Daily,   R     Question:  Specimen collection method  Answer:  Lab=Lab collect   12/05/23 1142   12/06/23 0500  CBC  Daily,   R     Question:  Specimen collection method  Answer:  Lab=Lab collect   12/05/23 1142          Data Reviewed: I have personally reviewed following labs and imaging studies CBC: Recent Labs  Lab 11/30/23 0528 11/30/23 1817 12/01/23 0522 12/02/23 0703 12/05/23 0642 12/06/23 0614  WBC 12.5*  --  14.9* 12.6* 13.1* 13.4*  NEUTROABS 9.0*  --   --   --   --   --   HGB 7.4* 8.2* 8.1* 8.0* 8.0* 7.3*  HCT 23.0* 25.5* 25.6* 25.1* 25.2* 23.0*  MCV 87.8  --  86.5 89.6 89.7 88.8  PLT 256  --  302 293 366 390   Basic Metabolic Panel:  Recent Labs  Lab 11/30/23 0528 12/03/23 0702 12/04/23 0559 12/05/23 0642 12/06/23 0614  NA 133* 130* 129* 131* 130*  K 3.9 4.4 4.8 4.5 4.7  CL 104 103 103 103 102  CO2 22 20* 20* 22 23  GLUCOSE 128* 110* 104* 101* 108*  BUN 24* 21 20 21  26*  CREATININE 0.97 0.82 1.02 0.91 0.90  CALCIUM 9.9 10.7* 11.5* 11.0* 10.4*  MG  --   --   --   --  2.1  PHOS  --   --   --   --  1.5*   GFR: Estimated Creatinine Clearance: 83.5 mL/min (by C-G formula based on SCr of 0.9 mg/dL). Liver Function Tests:  Recent Labs  Lab 11/30/23 0528 12/03/23 0702 12/05/23 0642 12/06/23 0614  AST 58*  --  43* 57*  ALT 51*  --  67* 75*  ALKPHOS 72  --  93 100  BILITOT 0.4  --  0.4 0.5  PROT 4.7*  --  5.4* 5.4*  ALBUMIN <1.5* 1.5* 1.6* 1.5*  Sepsis Labs: No results for input(s): "PROCALCITON", "LATICACIDVEN" in the last 168 hours. Recent Results (from the  past 240 hours)  Blood culture (routine x 2)     Status: None   Collection Time: 11/26/23 12:13 PM   Specimen: BLOOD  Result Value Ref Range Status   Specimen Description BLOOD BLOOD RIGHT ARM  Final   Special  Requests   Final    BOTTLES DRAWN AEROBIC AND ANAEROBIC Blood Culture results may not be optimal due to an inadequate volume of blood received in culture bottles   Culture   Final    NO GROWTH 5 DAYS Performed at [facility], [address] [place], [place] [address]    Report Status 12/01/2023 FINAL  Final  Blood culture (routine x 2)     Status: None   Collection Time: 11/26/23 12:20 PM   Specimen: BLOOD  Result Value Ref Range Status   Specimen Description BLOOD BLOOD RIGHT HAND AEROBIC BOTTLE ONLY  Final   Special Requests   Final    Blood Culture results may not be optimal due to an inadequate volume of blood received in culture bottles BOTTLES DRAWN AEROBIC ONLY   Culture   Final    NO GROWTH 5 DAYS Performed at [facility], [address] [place], [place] [address]    Report Status 12/01/2023 FINAL  Final  MRSA Next Gen by PCR, Nasal     Status: None   Collection Time: 11/26/23  9:02 PM   Specimen: Nasal Mucosa; Nasal Swab  Result Value Ref Range Status   MRSA by PCR Next Gen NOT DETECTED NOT DETECTED Final    Comment: (NOTE) The GeneXpert MRSA Assay (FDA approved for NASAL specimens only), is one component of a comprehensive MRSA colonization surveillance program. It is not intended to diagnose MRSA infection nor to guide or monitor treatment for MRSA infections. Test performance is not FDA approved in patients less than 86 years old. Performed at [facility], [address] [place], [place] [address]   Culture, blood (Routine X 2) w Reflex to ID Panel     Status: None   Collection Time: 11/30/23  5:25 AM   Specimen: BLOOD RIGHT ARM  Result Value Ref Range Status   Specimen Description BLOOD RIGHT ARM  Final   Special Requests   Final    BOTTLES  DRAWN AEROBIC AND ANAEROBIC Blood Culture results may not be optimal due to an inadequate volume of blood received in culture bottles   Culture   Final    NO GROWTH 5 DAYS Performed at [facility], [address], [place], [place] [address]    Report Status 12/05/2023 FINAL  Final  Culture, blood (Routine X 2) w Reflex to ID Panel     Status: None   Collection Time: 11/30/23  5:26 AM   Specimen: BLOOD RIGHT ARM  Result Value Ref Range Status   Specimen Description BLOOD RIGHT ARM  Final   Special Requests   Final    BOTTLES DRAWN AEROBIC AND ANAEROBIC Blood Culture results may not be optimal due to an inadequate volume of blood received in culture bottles   Culture   Final    NO GROWTH 5 DAYS Performed at [facility], [address] [place], [place] [address]    Report Status 12/05/2023 FINAL  Final    Antimicrobials/Microbiology: Anti-infectives (From admission, onward)    Start     Dose/Rate Route Frequency Ordered Stop   12/03/23 1245  doxycycline (VIBRA-TABS) tablet 100 mg        100 mg Oral Every 12 hours 12/03/23 1155 12/06/23 2359   12/03/23 1245  amoxicillin-clavulanate (AUGMENTIN) 875-125 MG per tablet 1 tablet        1 tablet Oral Every 12 hours 12/03/23 1155 12/06/23 2359   11/30/23 1800  vancomycin (VANCOREADY) IVPB  750 mg/150 mL  Status:  Discontinued        750 mg 150 mL/hr over 60 Minutes Intravenous Every 12 hours 11/30/23 0234 12/03/23 1155   11/30/23 0430  vancomycin (VANCOCIN) IVPB 1000 mg/200 mL premix       Placed in "Followed by" Linked Group   1,000 mg 200 mL/hr over 60 Minutes Intravenous  Once 11/30/23 0237 11/30/23 0659   11/30/23 0400  ceFEPIme (MAXIPIME) 2 g in sodium chloride 0.9 % 100 mL IVPB  Status:  Discontinued        2 g 200 mL/hr over 30 Minutes Intravenous Every 8 hours 11/30/23 0234 12/03/23 1155   11/30/23 0330  vancomycin (VANCOREADY) IVPB 2000 mg/400 mL  Status:  Discontinued        2,000 mg 200 mL/hr over 120 Minutes  Intravenous  Once 11/30/23 0234 11/30/23 0236   11/30/23 0330  vancomycin (VANCOCIN) IVPB 1000 mg/200 mL premix       Placed in "Followed by" Linked Group   1,000 mg 200 mL/hr over 60 Minutes Intravenous  Once 11/30/23 0237 11/30/23 0558   11/28/23 1300  vancomycin (VANCOREADY) IVPB 1500 mg/300 mL  Status:  Discontinued        1,500 mg 150 mL/hr over 120 Minutes Intravenous Every 48 hours 11/26/23 1301 11/27/23 1107   11/26/23 2000  piperacillin-tazobactam (ZOSYN) IVPB 3.375 g  Status:  Discontinued        3.375 g 12.5 mL/hr over 240 Minutes Intravenous Every 8 hours 11/26/23 1850 11/28/23 0921   11/26/23 1330  vancomycin (VANCOCIN) IVPB 1000 mg/200 mL premix        1,000 mg 200 mL/hr over 60 Minutes Intravenous Every 1 hr x 2 11/26/23 1251 11/26/23 1617   11/26/23 1245  ceFEPIme (MAXIPIME) 2 g in sodium chloride 0.9 % 100 mL IVPB        2 g 200 mL/hr over 30 Minutes Intravenous  Once 11/26/23 1243 11/26/23 1420         Component Value Date/Time   SDES BLOOD RIGHT ARM 11/30/2023 0526   SPECREQUEST  11/30/2023 0526    BOTTLES DRAWN AEROBIC AND ANAEROBIC Blood Culture results may not be optimal due to an inadequate volume of blood received in culture bottles   CULT  11/30/2023 0526    NO GROWTH 5 DAYS Performed at [facility], [address], [place], [place] [address]    REPTSTATUS 12/05/2023 FINAL 11/30/2023 1324     Radiology Studies: No results found.   LOS: 10 days   Total time spent in review of labs and imaging, patient evaluation, formulation of plan, documentation and communication with family: 35 minutes  Lanae Boast, MD [facility]  12/06/2023, 8:20 AM

## 2023-12-06 NOTE — TOC Progression Note (Addendum)
Transition of Care [facility]) - Progression Note    Patient Details  Name: [name] MRN: [id number] Date of Birth: Mar 27, 1951  Transition [facility]) CM/SW Contact  Adams Hinch A [place], [place] Phone Number: 12/06/2023, 3:52 PM  Clinical Narrative:     1443 Pt's Berkley Harvey is still pending for insurance. Pt cannot discharge over the weekend, bed is available Monday if authorization is approved.   Update 12/13 4098 Pt's auth still pending for insurance.   Pt's authorization request for insurance completed to [facility]. Auth status pending.  Auth ID: [id number]   TOC will continue to follow.   Expected Discharge Plan: Skilled Nursing Facility Barriers to Discharge: Continued Medical Work up, English as a second language teacher  Expected Discharge Plan and Services       Living arrangements for the past 2 months: Single Family Home                                       Social Determinants of Health (SDOH) Interventions SDOH Screenings   Food Insecurity: Patient Unable To Answer (11/26/2023)  Recent Concern: Food Insecurity - Food Insecurity Present (10/09/2023)  Housing: Patient Unable To Answer (10/09/2023)  Transportation Needs: No Transportation Needs (10/09/2023)  Utilities: Patient Unable To Answer (11/26/2023)  Depression (PHQ2-9): Low Risk  (10/10/2023)  Financial Resource Strain: Medium Risk (10/09/2023)  Physical Activity: Unknown (10/09/2023)  Social Connections: Moderately Integrated (10/09/2023)  Stress: No Stress Concern Present (10/09/2023)  Tobacco Use: Medium Risk (12/01/2023)    Readmission Risk Interventions     No data to display

## 2023-12-06 NOTE — Plan of Care (Signed)
  Problem: Education: Goal: Knowledge of General Education information will improve Description: Including pain rating scale, medication(s)/side effects and non-pharmacologic comfort measures Outcome: Progressing   Problem: Health Behavior/Discharge Planning: Goal: Ability to manage health-related needs will improve Outcome: Progressing   Problem: Clinical Measurements: Goal: Ability to maintain clinical measurements within normal limits will improve Outcome: Progressing Goal: Will remain free from infection Outcome: Progressing Goal: Diagnostic test results will improve Outcome: Progressing Goal: Respiratory complications will improve Outcome: Progressing Goal: Cardiovascular complication will be avoided Outcome: Progressing   Problem: Activity: Goal: Risk for activity intolerance will decrease Outcome: Progressing   Problem: Nutrition: Goal: Adequate nutrition will be maintained Outcome: Progressing   Problem: Coping: Goal: Level of anxiety will decrease Outcome: Progressing   Problem: Elimination: Goal: Will not experience complications related to bowel motility Outcome: Progressing Goal: Will not experience complications related to urinary retention Outcome: Progressing   Problem: Pain Management: Goal: General experience of comfort will improve Outcome: Progressing   Problem: Safety: Goal: Ability to remain free from injury will improve Outcome: Progressing   Problem: Skin Integrity: Goal: Risk for impaired skin integrity will decrease Outcome: Progressing   Problem: Education: Goal: Knowledge of General Education information will improve Description: Including pain rating scale, medication(s)/side effects and non-pharmacologic comfort measures Outcome: Progressing   Problem: Health Behavior/Discharge Planning: Goal: Ability to manage health-related needs will improve Outcome: Progressing   Problem: Clinical Measurements: Goal: Ability to maintain  clinical measurements within normal limits will improve Outcome: Progressing Goal: Will remain free from infection Outcome: Progressing Goal: Diagnostic test results will improve Outcome: Progressing Goal: Respiratory complications will improve Outcome: Progressing Goal: Cardiovascular complication will be avoided Outcome: Progressing   Problem: Activity: Goal: Risk for activity intolerance will decrease Outcome: Progressing   Problem: Nutrition: Goal: Adequate nutrition will be maintained Outcome: Progressing   Problem: Coping: Goal: Level of anxiety will decrease Outcome: Progressing   Problem: Elimination: Goal: Will not experience complications related to bowel motility Outcome: Progressing Goal: Will not experience complications related to urinary retention Outcome: Progressing   Problem: Pain Management: Goal: General experience of comfort will improve Outcome: Progressing   Problem: Safety: Goal: Ability to remain free from injury will improve Outcome: Progressing   Problem: Skin Integrity: Goal: Risk for impaired skin integrity will decrease Outcome: Progressing   Problem: Fluid Volume: Goal: Hemodynamic stability will improve Outcome: Progressing   Problem: Clinical Measurements: Goal: Diagnostic test results will improve Outcome: Progressing Goal: Signs and symptoms of infection will decrease Outcome: Progressing   Problem: Respiratory: Goal: Ability to maintain adequate ventilation will improve Outcome: Progressing   Problem: Respiratory: Goal: Ability to maintain adequate ventilation will improve Outcome: Progressing

## 2023-12-06 NOTE — Plan of Care (Signed)
  Problem: Clinical Measurements: Goal: Will remain free from infection Outcome: Progressing   Problem: Clinical Measurements: Goal: Diagnostic test results will improve Outcome: Progressing   Problem: Education: Goal: Knowledge of General Education information will improve Description: Including pain rating scale, medication(s)/side effects and non-pharmacologic comfort measures Outcome: Progressing   Problem: Nutrition: Goal: Adequate nutrition will be maintained Outcome: Progressing

## 2023-12-07 DIAGNOSIS — R6521 Severe sepsis with septic shock: Secondary | ICD-10-CM | POA: Diagnosis not present

## 2023-12-07 DIAGNOSIS — A419 Sepsis, unspecified organism: Secondary | ICD-10-CM | POA: Diagnosis not present

## 2023-12-07 LAB — COMPREHENSIVE METABOLIC PANEL
ALT: 72 U/L — ABNORMAL HIGH (ref 0–44)
AST: 51 U/L — ABNORMAL HIGH (ref 15–41)
Albumin: 2 g/dL — ABNORMAL LOW (ref 3.5–5.0)
Alkaline Phosphatase: 83 U/L (ref 38–126)
Anion gap: 8 (ref 5–15)
BUN: 34 mg/dL — ABNORMAL HIGH (ref 8–23)
CO2: 21 mmol/L — ABNORMAL LOW (ref 22–32)
Calcium: 9.9 mg/dL (ref 8.9–10.3)
Chloride: 103 mmol/L (ref 98–111)
Creatinine, Ser: 1.19 mg/dL (ref 0.61–1.24)
GFR, Estimated: >60 mL/min (ref >60)
Glucose, Bld: 127 mg/dL — ABNORMAL HIGH (ref 70–99)
Potassium: 4.3 mmol/L (ref 3.5–5.1)
Sodium: 132 mmol/L — ABNORMAL LOW (ref 135–145)
Total Bilirubin: 0.6 mg/dL (ref <1.2)
Total Protein: 5.3 g/dL — ABNORMAL LOW (ref 6.5–8.1)

## 2023-12-07 LAB — CBC
HCT: 19.5 % — ABNORMAL LOW (ref 39.0–52.0)
Hemoglobin: 6.2 g/dL — CL (ref 13.0–17.0)
MCH: 28.3 pg (ref 26.0–34.0)
MCHC: 31.8 g/dL (ref 30.0–36.0)
MCV: 89 fL (ref 80.0–100.0)
Platelets: 333 10*3/uL (ref 150–400)
RBC: 2.19 MIL/uL — ABNORMAL LOW (ref 4.22–5.81)
RDW: 18 % — ABNORMAL HIGH (ref 11.5–15.5)
WBC: 11.3 10*3/uL — ABNORMAL HIGH (ref 4.0–10.5)
nRBC: 0 % (ref 0.0–0.2)

## 2023-12-07 LAB — PREPARE RBC (CROSSMATCH)

## 2023-12-07 LAB — HEMOGLOBIN AND HEMATOCRIT, BLOOD
HCT: 25.7 % — ABNORMAL LOW (ref 39.0–52.0)
Hemoglobin: 8.2 g/dL — ABNORMAL LOW (ref 13.0–17.0)

## 2023-12-07 MED ORDER — ALBUMIN HUMAN 25 % IV SOLN
25.0000 g | Freq: Once | INTRAVENOUS | Status: AC
  Administered 2023-12-07: 25 g via INTRAVENOUS
  Filled 2023-12-07: qty 100

## 2023-12-07 MED ORDER — MELATONIN 3 MG PO TABS
3.0000 mg | ORAL_TABLET | Freq: Every evening | ORAL | Status: DC | PRN
  Administered 2023-12-07 – 2023-12-09 (×2): 3 mg via ORAL
  Filled 2023-12-07 (×2): qty 1

## 2023-12-07 MED ORDER — SODIUM CHLORIDE 0.9% IV SOLUTION: Freq: Once | INTRAVENOUS | Status: AC

## 2023-12-07 MED ORDER — SALINE SPRAY 0.65 % NA SOLN
1.0000 | NASAL | Status: DC | PRN
  Administered 2023-12-07: 1 via NASAL
  Filled 2023-12-07: qty 44

## 2023-12-07 MED ORDER — ACETAMINOPHEN 325 MG PO TABS
650.0000 mg | ORAL_TABLET | Freq: Once | ORAL | Status: AC
  Administered 2023-12-07: 650 mg via ORAL
  Filled 2023-12-07: qty 2

## 2023-12-07 NOTE — Progress Notes (Signed)
PROGRESS NOTE [name]  ZDG:[id number] DOB: [birth year] DOA: 11/26/2023 PCP: [facility], FNP  Brief Narrative/Hospital Course: [name] is a 72 y.o. male with a history of hypertension, hyperlipidemia, PAD status post right AKA, CKD stage IIIa, GERD, prostate cancer status post prostatectomy.  Patient presented secondary to being found down on the ground and altered and was found to have evidence of hypotension concerning for septic shock with additional evidence of rhabdomyolysis.  Patient started empirically on antibiotics, started on vasopressor support, and IV fluids and was transferred to [facility] from [facility] for ICU admission.  Antibiotics were discontinued secondary to unlikely infectious etiology.  Hypotension and rhabdomyolysis improved with IV fluids and vasopressor support was discontinued.  Admission complicated by the identification of metastatic disease of unknown primary. Calcium worsening.    Subjective: Patient seen and examined this morning Alert awake resting comfortably no complaints Labs shows hemoglobin has dropped to 6.2 Overnight soft blood pressure in 90s received albumin x 1 Ca pending  Assessment and Plan: Principal Problem:   Sepsis (HCC) Active Problems:   Normocytic anemia   Hypovolemic shock (HCC)   Rhabdomyolysis   Pressure injury of skin   AKI (acute kidney injury) (HCC)   DVT (deep venous thrombosis) (HCC)   GERD (gastroesophageal reflux disease)   Transaminitis   Malnutrition of moderate degree   Metastatic adenocarcinoma (HCC)  Hypercalcemia of malignancy: Calcium is still high but improving nicely, has low albumin, likely related to metastatic bone disease ?Intermittent confusion but currently mental status stable. S/P Zometa x 1 on 12/04/23. Cont iv Lasix as tolerated, repeat calcium level pending but overall improving  Recent Labs  Lab 12/03/23 0702 12/04/23 0559 12/05/23 0642  12/06/23 0614  CALCIUM 10.7* 11.5* 11.0* 10.4*    Hypophosphatemia: Resolved   Poorly differentiated adenocarcinoma of unknown primary History of prostate cancer-f/b Dr Ellin Saba: Soft tissue mass anterior abdominal wall biopsy positive for malignancy as above.  Oncology following. S/p  CT C-spine head/A/P and maxillofacial done on 12/2- shows large lytic lesion T1 likely mets with likely epidural spread, multiple enlarged cervical lymph nodes and neck by adrenal masses multiple bone mets. CT chest 12/3 right hilar mass adenopathy right adrenal mets. Underwent GI eval with EGD 12/7 negative for upper GI malignancy.  Acute DVT right upper extremity: Age indeterminate left lower extremity DVT/Chronic right lower extremity DVT Continue Eliquis.  Watch hemoglobin closely  Normocytic anemia Anemia of malignancy: Multifactorial due to malignancy, infections,CKD.  Transfuse 1 unit PRBC today Recent Labs  Lab 12/01/23 0522 12/02/23 0703 12/05/23 0642 12/06/23 0614 12/07/23 0716  HGB 8.1* 8.0* 8.0* 7.3* 6.2*  HCT 25.6* 25.1* 25.2* 23.0* 19.5*    Hypovolemic shock Sepsis ruled out. Patient managed in ICU with norepinephrine and fluid resuscitation.  Vitals stable.  Patient Elevin overnight.  Traumatic rhabdomyolysis Secondary to being down. CK of 6,948 on admission.Last CK5 as below. Associated elevated AST/ALT. Improvement with IV fluids.  No results for input(s): "CKTOTAL" in the last 168 hours.   AKI Creatinine peaked to 2.2 likely AKI now improved Recent Labs    11/12/23 1519 11/26/23 1033 11/27/23 0859 11/28/23 0324 11/29/23 0659 11/30/23 0528 12/03/23 0702 12/04/23 0559 12/05/23 0642 12/06/23 0614  BUN 37* 97* 60* 43* 29* 24* 21 20 21  26*  CREATININE 1.60* 2.21* 1.38* 1.19 0.97 0.97 0.82 1.02 0.91 0.90  CO2 25 21* 26 27 25 22  20* 20* 22 23  K 3.6 4.0 3.7 3.6 3.8 3.9 4.4  4.8 4.5 4.7    GERD Continue Protonix.  HTN: Antihypertensives on hold BP soft    Hyponatremia Hypovolemic, encourage oral intake    Hyperlipidemia Holding Lipitor   PAD right AKA: Cont asa    Scrotal cellulitis/candidiasis MRI without deep infection, abscess, or features necrotizing disease. Infection appears to be improving. Blood cultures (12/6) are no growth to date. Continue Nystatin, Doxycycline and Augmentin and course has been completed 12/06/23.   Sepsis Unclear is present on admission, but recurrent criteria met during hospitalization secondary to scrotal infection. Blood cultures obtained. Empiric vancomycin and Cefepime started for treatment. Blood cultures (12/6) are no growth to date.   Phantom limb pain Noted.   Foley catheter in place Placed secondary to scrotal wound and significant edema. -Discontinue Foley catheter today or tomorrow    Moderate malnutrition Continue to augment nutritional status, RD following Nutrition Problem: Moderate Malnutrition Etiology: acute illness Signs/Symptoms: estimated needs, moderate fat depletion, moderate muscle depletion Interventions: Ensure Enlive (each supplement provides 350kcal and 20 grams of protein), MVI, Liberalize Diet   Class I Obesity: Patient's Body mass index is 29.13 kg/m. : Will benefit with PCP follow-up, weight loss  healthy lifestyle and outpatient sleep evaluation.  Goals of care: Currently full code but his prognosis is not bright, will benefit with Perative care evaluation-PMT consult requested  Pressure injury.  Pretibial left stage II POA wound care as below Pressure Injury 11/26/23 Pretibial Distal;Left;Medial Stage 2 -  Partial thickness loss of dermis presenting as a shallow open injury with a red, pink wound bed without slough. Edematous excoriated area (Active)  11/26/23 1845  Location: Pretibial  Location Orientation: Distal;Left;Medial  Staging: Stage 2 -  Partial thickness loss of dermis presenting as a shallow open injury with a red, pink wound bed without slough.   Wound Description (Comments): Edematous excoriated area  Present on Admission: Yes  Dressing Type Foam - Lift dressing to assess site every shift 12/04/23 1400    DVT prophylaxis:  Code Status:   Code Status: Full Code Family Communication: plan of care discussed with patient  at bedside.  I called patient's sister over the phone no answer left message. Patient status is: Remains hospitalized because of severity of illness Level of care: Med-Surg   Dispo: The patient is from: home.  Sister is helping with decision for SNF            Anticipated disposition: SNF in 24 hrs if hb and calcium is stable   Objective: Vitals last 24 hrs: Vitals:   12/07/23 0012 12/07/23 0457 12/07/23 0530 12/07/23 0729  BP: (!) 102/48 (!) 92/38  (!) 96/46  Pulse: (!) 105 99  97  Resp: 18 17  18   Temp: 99.1 F (37.3 C) 99.2 F (37.3 C)  97.8 F (36.6 C)  TempSrc: Oral Oral    SpO2: 92% 93%  93%  Weight:   92.1 kg   Height:       Weight change: -0.5 kg  Physical Examination: General exam: alert awake, ill and frail appearing.  HEENT:Oral mucosa moist, Ear/Nose WNL grossly Respiratory system: Bilaterally clear BS,no use of accessory muscle Cardiovascular system: S1 & S2 +, No JVD. Gastrointestinal system: Abdomen soft,NT,ND, BS+ Nervous System: Alert, awake, moving all extremities,and following commands. Extremities: LE edema +,distal peripheral pulses palpable and warm.  Skin: No rashes,no icterus. MSK: Normal muscle bulk,tone, power   Medications reviewed:  Scheduled Meds:  sodium chloride   Intravenous Once   acetaminophen  650 mg Oral  Once   apixaban  10 mg Oral BID   Followed by   Melene Muller ON 12/10/2023] apixaban  5 mg Oral BID   aspirin  81 mg Oral Daily   Chlorhexidine Gluconate Cloth  6 each Topical Daily   docusate sodium  100 mg Oral BID   feeding supplement  237 mL Oral QHS   furosemide  20 mg Intravenous Daily   Gerhardt's butt cream   Topical BID   multivitamin with minerals   1 tablet Oral Daily   nystatin   Topical BID   Continuous Infusions:  Diet Order             Diet regular Room service appropriate? Yes; Fluid consistency: Thin  Diet effective now                   Intake/Output Summary (Last 24 hours) at 12/07/2023 0847 Last data filed at 12/07/2023 0715 Gross per 24 hour  Intake 293.24 ml  Output 1100 ml  Net -806.76 ml   Net IO Since Admission: -106.53 mL [12/07/23 0847]  Wt Readings from Last 3 Encounters:  12/07/23 92.1 kg  11/12/23 96.6 kg  10/31/23 98.9 kg     Unresulted Labs (From admission, onward)     Start     Ordered   12/07/23 0847  Type and screen [facility]  (Blood Administration Adult)  Once,   R       Comments: [facility]    12/07/23 0847   12/07/23 0847  Prepare RBC (crossmatch)  (Blood Administration Adult)  Once,   R       Question Answer Comment  # of Units 1 unit   Transfusion Indications Hemoglobin < 7 gm/dL and symptomatic   Number of Units to Keep Ahead NO units ahead   If emergent release call blood bank Not emergent release      12/07/23 0847   12/06/23 0500  Comprehensive metabolic panel  Daily,   R     Question:  Specimen collection method  Answer:  Lab=Lab collect   12/05/23 1142   12/06/23 0500  CBC  Daily,   R     Question:  Specimen collection method  Answer:  Lab=Lab collect   12/05/23 1142          Data Reviewed: I have personally reviewed following labs and imaging studies CBC: Recent Labs  Lab 12/01/23 0522 12/02/23 0703 12/05/23 0642 12/06/23 0614 12/07/23 0716  WBC 14.9* 12.6* 13.1* 13.4* 11.3*  HGB 8.1* 8.0* 8.0* 7.3* 6.2*  HCT 25.6* 25.1* 25.2* 23.0* 19.5*  MCV 86.5 89.6 89.7 88.8 89.0  PLT 302 293 366 390 333   Basic Metabolic Panel:  Recent Labs  Lab 12/03/23 0702 12/04/23 0559 12/05/23 0642 12/06/23 0614 12/06/23 2045  NA 130* 129* 131* 130*  --   K 4.4 4.8 4.5 4.7  --   CL 103 103 103 102  --   CO2 20* 20* 22 23  --    GLUCOSE 110* 104* 101* 108*  --   BUN 21 20 21  26*  --   CREATININE 0.82 1.02 0.91 0.90  --   CALCIUM 10.7* 11.5* 11.0* 10.4*  --   MG  --   --   --  2.1  --   PHOS  --   --   --  1.5* 3.1   GFR: Estimated Creatinine Clearance: 83.3 mL/min (by C-G formula based on SCr of 0.9 mg/dL). Liver  Function Tests:  Recent Labs  Lab 12/03/23 0702 12/05/23 0642 12/06/23 0614  AST  --  43* 57*  ALT  --  67* 75*  ALKPHOS  --  93 100  BILITOT  --  0.4 0.5  PROT  --  5.4* 5.4*  ALBUMIN 1.5* 1.6* 1.5*  Sepsis Labs: No results for input(s): "PROCALCITON", "LATICACIDVEN" in the last 168 hours. Recent Results (from the past 240 hours)  Culture, blood (Routine X 2) w Reflex to ID Panel     Status: None   Collection Time: 11/30/23  5:25 AM   Specimen: BLOOD RIGHT ARM  Result Value Ref Range Status   Specimen Description BLOOD RIGHT ARM  Final   Special Requests   Final    BOTTLES DRAWN AEROBIC AND ANAEROBIC Blood Culture results may not be optimal due to an inadequate volume of blood received in culture bottles   Culture   Final    NO GROWTH 5 DAYS Performed at [facility], [address], [place], [place] [address]    Report Status 12/05/2023 FINAL  Final  Culture, blood (Routine X 2) w Reflex to ID Panel     Status: None   Collection Time: 11/30/23  5:26 AM   Specimen: BLOOD RIGHT ARM  Result Value Ref Range Status   Specimen Description BLOOD RIGHT ARM  Final   Special Requests   Final    BOTTLES DRAWN AEROBIC AND ANAEROBIC Blood Culture results may not be optimal due to an inadequate volume of blood received in culture bottles   Culture   Final    NO GROWTH 5 DAYS Performed at [facility], [address], [place], [place] [address]    Report Status 12/05/2023 FINAL  Final    Antimicrobials/Microbiology: Anti-infectives (From admission, onward)    Start     Dose/Rate Route Frequency Ordered Stop   12/03/23 1245  doxycycline (VIBRA-TABS) tablet 100 mg        100 mg  Oral Every 12 hours 12/03/23 1155 12/06/23 2125   12/03/23 1245  amoxicillin-clavulanate (AUGMENTIN) 875-125 MG per tablet 1 tablet        1 tablet Oral Every 12 hours 12/03/23 1155 12/06/23 2125   11/30/23 1800  vancomycin (VANCOREADY) IVPB 750 mg/150 mL  Status:  Discontinued        750 mg 150 mL/hr over 60 Minutes Intravenous Every 12 hours 11/30/23 0234 12/03/23 1155   11/30/23 0430  vancomycin (VANCOCIN) IVPB 1000 mg/200 mL premix       Placed in "Followed by" Linked Group   1,000 mg 200 mL/hr over 60 Minutes Intravenous  Once 11/30/23 0237 11/30/23 0659   11/30/23 0400  ceFEPIme (MAXIPIME) 2 g in sodium chloride 0.9 % 100 mL IVPB  Status:  Discontinued        2 g 200 mL/hr over 30 Minutes Intravenous Every 8 hours 11/30/23 0234 12/03/23 1155   11/30/23 0330  vancomycin (VANCOREADY) IVPB 2000 mg/400 mL  Status:  Discontinued        2,000 mg 200 mL/hr over 120 Minutes Intravenous  Once 11/30/23 0234 11/30/23 0236   11/30/23 0330  vancomycin (VANCOCIN) IVPB 1000 mg/200 mL premix       Placed in "Followed by" Linked Group   1,000 mg 200 mL/hr over 60 Minutes Intravenous  Once 11/30/23 0237 11/30/23 0558   11/28/23 1300  vancomycin (VANCOREADY) IVPB 1500 mg/300 mL  Status:  Discontinued        1,500  mg 150 mL/hr over 120 Minutes Intravenous Every 48 hours 11/26/23 1301 11/27/23 1107   11/26/23 2000  piperacillin-tazobactam (ZOSYN) IVPB 3.375 g  Status:  Discontinued        3.375 g 12.5 mL/hr over 240 Minutes Intravenous Every 8 hours 11/26/23 1850 11/28/23 0921   11/26/23 1330  vancomycin (VANCOCIN) IVPB 1000 mg/200 mL premix        1,000 mg 200 mL/hr over 60 Minutes Intravenous Every 1 hr x 2 11/26/23 1251 11/26/23 1617   11/26/23 1245  ceFEPIme (MAXIPIME) 2 g in sodium chloride 0.9 % 100 mL IVPB        2 g 200 mL/hr over 30 Minutes Intravenous  Once 11/26/23 1243 11/26/23 1420         Component Value Date/Time   SDES BLOOD RIGHT ARM 11/30/2023 0526   SPECREQUEST  11/30/2023  0526    BOTTLES DRAWN AEROBIC AND ANAEROBIC Blood Culture results may not be optimal due to an inadequate volume of blood received in culture bottles   CULT  11/30/2023 0526    NO GROWTH 5 DAYS Performed at [facility], [address] [place], [place] [address]    REPTSTATUS 12/05/2023 FINAL 11/30/2023 6213     Radiology Studies: No results found.   LOS: 11 days   Total time spent in review of labs and imaging, patient evaluation, formulation of plan, documentation and communication with family: 35 minutes  Lanae Boast, MD [facility]  12/07/2023, 8:47 AM

## 2023-12-07 NOTE — Progress Notes (Signed)
BP 92/37, [name], MD was notified.  [name]

## 2023-12-07 NOTE — Plan of Care (Signed)
  Problem: Education: Goal: Knowledge of General Education information will improve Description: Including pain rating scale, medication(s)/side effects and non-pharmacologic comfort measures Outcome: Progressing   Problem: Health Behavior/Discharge Planning: Goal: Ability to manage health-related needs will improve Outcome: Progressing   Problem: Clinical Measurements: Goal: Respiratory complications will improve Outcome: Progressing   Problem: Activity: Goal: Risk for activity intolerance will decrease Outcome: Progressing   Problem: Nutrition: Goal: Adequate nutrition will be maintained Outcome: Progressing   Problem: Coping: Goal: Level of anxiety will decrease Outcome: Progressing   Problem: Elimination: Goal: Will not experience complications related to bowel motility Outcome: Progressing Goal: Will not experience complications related to urinary retention Outcome: Progressing   Problem: Pain Management: Goal: General experience of comfort will improve Outcome: Progressing   Problem: Safety: Goal: Ability to remain free from injury will improve Outcome: Progressing   Problem: Skin Integrity: Goal: Risk for impaired skin integrity will decrease Outcome: Progressing   Problem: Education: Goal: Knowledge of General Education information will improve Description: Including pain rating scale, medication(s)/side effects and non-pharmacologic comfort measures Outcome: Progressing   Problem: Health Behavior/Discharge Planning: Goal: Ability to manage health-related needs will improve Outcome: Progressing   Problem: Pain Management: Goal: General experience of comfort will improve Outcome: Progressing

## 2023-12-07 NOTE — Progress Notes (Signed)
Physical Therapy Treatment Patient Details Name: [name] MRN: [id number] DOB: 1951/08/11 Today's Date: 12/07/2023   History of Present Illness 72 y.o. male admitted 11/26/23 found down with 2-3 day h/o weakness and fall. Workup for rhabdomyolysis, AKI, septic shock. Imaging with L scalp hematoma. CT findings consistent with metastatic disease (multiple subQ masses, bilateral adrenal masses, R RP masses and multiple osseous metastases) with largest osseous metastasis of T10 vertebral body with cord compression S/p biopsy L abdominal wall mass 12/4. PMH includes prostate CA, PAD, HTN, HLD, R AKA (03/09/23).    PT Comments  Pt received in supine and agreeable to session. Pt continues to demonstrate poor initiation and weakness in BLE requiring increased cues and assist. Pt able to progress sitting balance from mod A to short periods of CGA with improved anterior weight shift and core activation. Pt able to participate in reaching tasks with RUE, however demonstrates posterior LOB requiring assist to correct when attempting to reach with LUE. Pt tearful at end of session and reports fear of falling and not understanding where the weakness is coming from. Pt continues to benefit from PT services to progress toward functional mobility goals.    If plan is discharge home, recommend the following: Two people to help with walking and/or transfers;Two people to help with bathing/dressing/bathroom   Can travel by private vehicle     No  Equipment Recommendations  Other (comment) (TBD)    Recommendations for Other Services       Precautions / Restrictions Precautions Precautions: Fall;Other (comment) Precaution Comments: h/o R AKA 02/2023 (has prosthetic LE at home); spinal precautions for comfort with mets to spine     Mobility  Bed Mobility Overal bed mobility: Needs Assistance Bed Mobility: Supine to Sit, Sit to Supine, Rolling Rolling: Max assist, Used rails   Supine to sit: Max  assist, HOB elevated, Used rails, +2 for physical assistance Sit to supine: Max assist, +2 for physical assistance   General bed mobility comments: Pt able to minimally advance LLE to EOB and pull on bedrail with cues, however requires max A +2 to sit to EOB. Heavy max A +2 to return to supine due to fatigue and weakness.    Transfers Overall transfer level: Needs assistance                 General transfer comment: attempted lateral scooting at EOB, however pt requires total A due to difficulty with anterior lean and fear of falling        Balance Overall balance assessment: Needs assistance Sitting-balance support: Bilateral upper extremity supported, Feet supported Sitting balance-Leahy Scale: Poor Sitting balance - Comments: session was eob. pt. able to weight shift reaching for object with R hand at verious heights and handing off in opposite direction varying levels of assist with mod a initially, with periods of cga and then mod/max at end of session when assisting back to bed due to fatigue                                    Cognition Arousal: Alert Behavior During Therapy: [facility] for tasks assessed/performed Overall Cognitive Status: No family/caregiver present to determine baseline cognitive functioning                                 General Comments: Pt tearful at end of session  and reporting concerns with emotional support provided. Pt demonstrates some slow processing and initiation during session        Exercises      General Comments        Pertinent Vitals/Pain Pain Assessment Pain Assessment: Faces Faces Pain Scale: Hurts a little bit Pain Location: some grimacing noted with bed mobility, likely back Pain Descriptors / Indicators: Aching, Discomfort, Grimacing Pain Intervention(s): Limited activity within patient's tolerance, Monitored during session, Repositioned     PT Goals (current goals can now be found in the  care plan section) Acute Rehab PT Goals Patient Stated Goal: willing to consider post-acute rehab at SNF PT Goal Formulation: With patient Time For Goal Achievement: 12/13/23 Progress towards PT goals: Progressing toward goals    Frequency    Min 1X/week           Co-evaluation PT/OT/SLP Co-Evaluation/Treatment: Yes Reason for Co-Treatment: For patient/therapist safety;To address functional/ADL transfers PT goals addressed during session: Mobility/safety with mobility;Balance;Strengthening/ROM OT goals addressed during session: Strengthening/ROM;ADL's and self-care      AM-PAC PT "6 Clicks" Mobility   Outcome Measure  Help needed turning from your back to your side while in a flat bed without using bedrails?: A Lot Help needed moving from lying on your back to sitting on the side of a flat bed without using bedrails?: Total Help needed moving to and from a bed to a chair (including a wheelchair)?: Total Help needed standing up from a chair using your arms (e.g., wheelchair or bedside chair)?: Total Help needed to walk in hospital room?: Total Help needed climbing 3-5 steps with a railing? : Total 6 Click Score: 7    End of Session   Activity Tolerance: Patient limited by fatigue Patient left: in bed;with call bell/phone within reach;with bed alarm set Nurse Communication: Mobility status PT Visit Diagnosis: Other abnormalities of gait and mobility (R26.89);Muscle weakness (generalized) (M62.81);Pain     Time: 9604-5409 PT Time Calculation (min) (ACUTE ONLY): 23 min  Charges:    $Therapeutic Activity: 8-22 mins PT General Charges $$ ACUTE PT VISIT: 1 Visit                     Johny Shock, PTA Acute Rehabilitation Services Secure Chat Preferred  [phone]    Johny Shock 12/07/2023, 1:20 PM

## 2023-12-07 NOTE — Progress Notes (Signed)
Occupational Therapy Treatment Patient Details Name: [name] MRN: [id number] DOB: [birth year] Today's Date: 12/07/2023   History of present illness 72 y.o. male admitted 11/26/23 found down with 2-3 day h/o weakness and fall. Workup for rhabdomyolysis, AKI, septic shock. Imaging with L scalp hematoma. CT findings consistent with metastatic disease (multiple subQ masses, bilateral adrenal masses, R RP masses and multiple osseous metastases) with largest osseous metastasis of T10 vertebral body with cord compression S/p biopsy L abdominal wall mass 12/4. PMH includes prostate CA, PAD, HTN, HLD, R AKA (03/09/23).   OT comments  Pt. Seen with PT for skilled therapy session.  Pt. Completed bed mobility and seated eob for sitting balance and trunk work along with increasing activity tolerance. Pt. Did well, max a x2 for bed mobility in/out.  Sitting balance varied from mod/max to periods of min/cga.  Will cont. With acute OT POC.       If plan is discharge home, recommend the following:  Two people to help with walking and/or transfers;Two people to help with bathing/dressing/bathroom;Assistance with cooking/housework;Assistance with feeding;Assist for transportation;Help with stairs or ramp for entrance   Equipment Recommendations       Recommendations for Other Services      Precautions / Restrictions Precautions Precautions: Fall;Other (comment) Precaution Comments: h/o R AKA 02/2023 (has prosthetic LE at home); spinal precautions for comfort with mets to spine       Mobility Bed Mobility Overal bed mobility: Needs Assistance Bed Mobility: Supine to Sit, Sit to Supine, Rolling Rolling: Max assist, Used rails   Supine to sit: Max assist, HOB elevated, Used rails, +2 for physical assistance Sit to supine: Max assist, +2 for physical assistance        Transfers                   General transfer comment: session was eob. pt. able to weight shift reaching for object  with R hand at verious heights and handing off in opposite direction varying levels of assist with mod a initially, with periods of cga and then mod/max at end of session when assisting back to bed due to fatigue     Balance Overall balance assessment: Needs assistance Sitting-balance support: Bilateral upper extremity supported, Feet supported Sitting balance-Leahy Scale: Poor Sitting balance - Comments: session was eob. pt. able to weight shift reaching for object with R hand at verious heights and handing off in opposite direction varying levels of assist with mod a initially, with periods of cga and then mod/max at end of session when assisting back to bed due to fatigue                                   ADL either performed or assessed with clinical judgement   ADL                                         General ADL Comments: session focused on bed mobility, increasing activity tolerance, and sitting balance and trunk control    Extremity/Trunk Assessment              Vision       Perception     Praxis      Cognition Arousal: Alert Behavior During Therapy: WFL for tasks assessed/performed (sad) Overall Cognitive Status: No family/caregiver  present to determine baseline cognitive functioning Area of Impairment: Attention, Safety/judgement, Awareness, Problem solving                   Current Attention Level: Focused   Following Commands: Follows one step commands consistently, Follows one step commands with increased time     Problem Solving: Requires verbal cues, Slow processing General Comments: pt. tearful towards end of session, listing multiple things that are upsetting him both therapist assts. receptive to his concerns and made sure each was addressed        Exercises      Shoulder Instructions       General Comments      Pertinent Vitals/ Pain       Pain Assessment Pain Location: some grimacing noted with  bed mobility, likely back Pain Descriptors / Indicators: Aching, Discomfort, Grimacing Pain Intervention(s): Repositioned, Monitored during session, Limited activity within patient's tolerance  Home Living                                          Prior Functioning/Environment              Frequency  Min 1X/week        Progress Toward Goals  OT Goals(current goals can now be found in the care plan section)  Progress towards OT goals: Progressing toward goals     Plan      Co-evaluation    PT/OT/SLP Co-Evaluation/Treatment: Yes Reason for Co-Treatment: For patient/therapist safety;To address functional/ADL transfers   OT goals addressed during session: Strengthening/ROM;ADL's and self-care      AM-PAC OT "6 Clicks" Daily Activity     Outcome Measure   Help from another person eating meals?: A Little Help from another person taking care of personal grooming?: A Little Help from another person toileting, which includes using toliet, bedpan, or urinal?: A Lot Help from another person bathing (including washing, rinsing, drying)?: A Lot Help from another person to put on and taking off regular upper body clothing?: A Lot Help from another person to put on and taking off regular lower body clothing?: A Lot 6 Click Score: 14    End of Session    OT Visit Diagnosis: Unsteadiness on feet (R26.81);Repeated falls (R29.6);Muscle weakness (generalized) (M62.81);History of falling (Z91.81);Pain Pain - Right/Left: Left Pain - part of body: Leg   Activity Tolerance Patient tolerated treatment well   Patient Left in bed;with call bell/phone within reach;with bed alarm set   Nurse Communication Other (comment) (reviewed with rn pts. concerns about his foot of bed being removed and he wants it back, also wanting to order his lunch/dinner does not like standard delivery tray)        Time: 1610-9604 OT Time Calculation (min): 23 min  Charges: OT General  Charges $OT Visit: 1 Visit OT Treatments $Self Care/Home Management : 8-22 mins  Boneta Lucks, COTA/L Acute Rehabilitation [phone]   Alessandra Bevels Lorraine-COTA/L 12/07/2023, 12:26 PM

## 2023-12-08 DIAGNOSIS — A419 Sepsis, unspecified organism: Secondary | ICD-10-CM | POA: Diagnosis not present

## 2023-12-08 DIAGNOSIS — Z515 Encounter for palliative care: Secondary | ICD-10-CM | POA: Diagnosis not present

## 2023-12-08 DIAGNOSIS — R6521 Severe sepsis with septic shock: Secondary | ICD-10-CM | POA: Diagnosis not present

## 2023-12-08 DIAGNOSIS — Z7189 Other specified counseling: Secondary | ICD-10-CM

## 2023-12-08 LAB — CBC
HCT: 23.2 % — ABNORMAL LOW (ref 39.0–52.0)
Hemoglobin: 7.4 g/dL — ABNORMAL LOW (ref 13.0–17.0)
MCH: 28.4 pg (ref 26.0–34.0)
MCHC: 31.9 g/dL (ref 30.0–36.0)
MCV: 88.9 fL (ref 80.0–100.0)
Platelets: 375 10*3/uL (ref 150–400)
RBC: 2.61 MIL/uL — ABNORMAL LOW (ref 4.22–5.81)
RDW: 17.2 % — ABNORMAL HIGH (ref 11.5–15.5)
WBC: 11.5 10*3/uL — ABNORMAL HIGH (ref 4.0–10.5)
nRBC: 0 % (ref 0.0–0.2)

## 2023-12-08 LAB — COMPREHENSIVE METABOLIC PANEL
ALT: 73 U/L — ABNORMAL HIGH (ref 0–44)
AST: 50 U/L — ABNORMAL HIGH (ref 15–41)
Albumin: 1.8 g/dL — ABNORMAL LOW (ref 3.5–5.0)
Alkaline Phosphatase: 100 U/L (ref 38–126)
Anion gap: 7 (ref 5–15)
BUN: 33 mg/dL — ABNORMAL HIGH (ref 8–23)
CO2: 23 mmol/L (ref 22–32)
Calcium: 9.4 mg/dL (ref 8.9–10.3)
Chloride: 101 mmol/L (ref 98–111)
Creatinine, Ser: 0.97 mg/dL (ref 0.61–1.24)
GFR, Estimated: >60 mL/min (ref >60)
Glucose, Bld: 119 mg/dL — ABNORMAL HIGH (ref 70–99)
Potassium: 4.4 mmol/L (ref 3.5–5.1)
Sodium: 131 mmol/L — ABNORMAL LOW (ref 135–145)
Total Bilirubin: 0.7 mg/dL (ref <1.2)
Total Protein: 5.4 g/dL — ABNORMAL LOW (ref 6.5–8.1)

## 2023-12-08 NOTE — Progress Notes (Signed)
BP 105/42, MAP 60. [name], MD was notified.  [name]

## 2023-12-08 NOTE — Consult Note (Cosign Needed Addendum)
Palliative Medicine Inpatient Consult Note  Consulting Provider:  Lanae Boast, MD   Reason for consult:   Palliative Care Consult Services Palliative Medicine Consult  Reason for Consult? code status, goals of care   12/08/2023  HPI:  Per intake H&P --> [name] is a 72 y.o. male with a history of hypertension, hyperlipidemia, PAD status post right AKA, CKD stage IIIa, GERD, prostate cancer status post prostatectomy. Patient presented secondary to being found down on the ground and altered and was found to have evidence of hypotension concerning for septic shock with additional evidence of rhabdomyolysis.   Palliative care has been asked to get involved for further goals of care conversations.   Clinical Assessment/Goals of Care:  *Please note that this is a verbal dictation therefore any spelling or grammatical errors are due to the "Dragon Medical One" system interpretation.  I have reviewed medical records including EPIC notes, labs and imaging, received report from bedside RN, assessed the patient who is lying in bed in NAD.    I met with [name] to further discuss diagnosis prognosis, GOC, EOL wishes, disposition and options.   I introduced Palliative Medicine as specialized medical care for people living with serious illness. It focuses on providing relief from the symptoms and stress of a serious illness. The goal is to improve quality of life for both the patient and the family.  Medical History Review and Understanding:  A review of [name] past medical history inclusive of CKD, PAD s/p R AKA, HTN, GERD, HLD, and prostatectomy in the setting of prostate cancer was held.   Social History:  [name] shares that he is from [place], [place]. He is not married. He has one son who lives in [place] and 2 grandchildren.  [name] worked at [employer] throughout the duration of his career.  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  He is a member of the [employer].  Functional and Nutritional State:  Prior to hospitalization [name] shares that he lived in a single-family home and was able to perform all basic activities of daily living independently.  He expresses that he was not able to drive a car but had a number of friends who would help transport him to and from doctors appointments. He has had a robust appetite.   Advance Directives:  A detailed discussion was had today regarding advanced directives.  [name] has never made a healthcare power of attorney or living will document we discussed doing this while he is hospitalized.  Code Status:  Concepts specific to code status, artifical feeding and hydration, continued IV antibiotics and rehospitalization was had.  The difference between a aggressive medical intervention path  and a palliative comfort care path for this patient at this time was had.   Encouraged patient/family to consider DNR/DNI status understanding evidenced based poor outcomes in similar hospitalized patient, as the cause of arrest is likely associated with advanced chronic/terminal illness rather than an easily reversible acute cardio-pulmonary event. I explained that DNR/DNI does not change the medical plan and it only comes into effect after a person has arrested (died).  It is a protective measure to keep [place] from harming the patient in their last moments of life. [name] was agreeable to DNR/DNI with understanding that patient would not receive CPR, defibrillation, ACLS medications, or intubation.   Discussion:  We reviewed the reasons leading up to hospitalization inclusive of a fall. We discussed unfortunately,  Ollice has had a series of events occur and diagnostic measures pursued which identified he has poorly differentiated adenocarcinoma.  We reviewed that the primary is unknown though patient has a history of underlying prostate cancer.   Discussed goals moving  forward.  At this time [name] would want to see what his treatment options are.  We talked about if treatments not possible or not desirable the idea of hospice care. I described hospice as a service for patients who have a life expectancy of 6 months or less. The goal of hospice is the preservation of dignity and quality at the end phases of life.  Under hospice care, the focus changes from curative to symptom relief.   Patient very tearful upon our time together as he feels he is getting a message that he is not Lewis to live very long.  I allowed time for patient to express himself and offered support through therapeutic listening.  Goals at this time include [name] gaining strength at rehab.  To obtain outpatient insights on possible cancer therapy.  Discussed the importance of continued conversation with family and their  medical providers regarding overall plan of care and treatment options, ensuring decisions are within the context of the patients values and GOCs.  Decision Maker: [name] (Sister): [phone] (Mobile)   SUMMARY OF RECOMMENDATIONS   DNAR/DNI  Open and honest conversations had in the setting of patient's poorly differentiated adenocarcinoma  Patient's goals are for rehabilitation and to see if his present cancer is treatable  Will refer to outpatient palliative symptom management clinic  Ongoing palliative care support  Code Status/Advance Care Planning: DNAR/DNI  Palliative Prophylaxis:  Aspiration, Bowel Regimen, Delirium Protocol, Frequent Pain Assessment, Oral Care, Palliative Wound Care, and Turn Reposition  Additional Recommendations (Limitations, Scope, Preferences): Continue current care  Psycho-social/Spiritual:  Desire for further Chaplaincy support: Yes patient is Holiness Additional Recommendations: Education on metastatic cancer   Prognosis: Limited overall in the setting of multiple comorbid factors and newly identified metastatic  cancer.  Discharge Planning: Discharge to skilled nursing once medically optimized.  Vitals:   12/08/23 0830 12/08/23 1031  BP: (!) 94/53 (!) 96/59  Pulse: (!) 101 100  Resp: 18 16  Temp: 99.1 F (37.3 C) 98.6 F (37 C)  SpO2: 95% 93%    Intake/Output Summary (Last 24 hours) at 12/08/2023 1202 Last data filed at 12/08/2023 0981 Gross per 24 hour  Intake 688.75 ml  Output 1225 ml  Net -536.25 ml   Last Weight  Most recent update: 12/08/2023  5:58 AM    Weight  92.5 kg (203 lb 14.8 oz)            Gen: Elderly African-American male in no acute distress HEENT: moist mucous membranes CV: Regular rate and rhythm  PULM: On room air breathing is even and nonlabored ABD: soft/nontender  EXT: Right AKA Neuro: Alert and oriented x3  PPS: 40-50%   This conversation/these recommendations were discussed with patient primary care team, Dr. Jonathon Bellows  Billing based on MDM: High  Problems Addressed: One acute or chronic illness or injury that poses a threat to life or bodily function  Amount and/or Complexity of Data: Category 3:Discussion of management or test interpretation with external physician/other qualified health care professional/appropriate source (not separately reported)  Risks: Decision regarding hospitalization or escalation of hospital care and Decision not to resuscitate or to de-escalate care because of poor prognosis ______________________________________________________ [name] [facility] Palliative Medicine Team Team Cell Phone: [phone] Please utilize secure  chat with additional questions, if there is no response within 30 minutes please call the above phone number  Palliative Medicine Team providers are available by phone from 7am to 7pm daily and can be reached through the team cell phone.  Should this patient require assistance outside of these hours, please call the patient's attending physician.

## 2023-12-08 NOTE — TOC Progression Note (Signed)
Transition of Care [facility]) - Progression Note    Patient Details  Name: [name] MRN: [id number] Date of Birth: Jan 30, 1951  [facility] CM/SW Contact  Mearl Latin, LCSW Phone Number: 12/08/2023, 12:02 PM  Clinical Narrative:    CSW noted insurance requests PT/OT updated notes. CSW uploaded notes and responded in the insurance portal.    Expected Discharge Plan: Skilled Nursing Facility Barriers to Discharge: Continued Medical Work up, English as a second language teacher  Expected Discharge Plan and Services       Living arrangements for the past 2 months: Single Family Home                                       Social Determinants of Health (SDOH) Interventions SDOH Screenings   Food Insecurity: Patient Unable To Answer (11/26/2023)  Recent Concern: Food Insecurity - Food Insecurity Present (10/09/2023)  Housing: Patient Unable To Answer (10/09/2023)  Transportation Needs: No Transportation Needs (10/09/2023)  Utilities: Patient Unable To Answer (11/26/2023)  Depression (PHQ2-9): Low Risk  (10/10/2023)  Financial Resource Strain: Medium Risk (10/09/2023)  Physical Activity: Unknown (10/09/2023)  Social Connections: Moderately Integrated (10/09/2023)  Stress: No Stress Concern Present (10/09/2023)  Tobacco Use: Medium Risk (12/01/2023)    Readmission Risk Interventions     No data to display

## 2023-12-08 NOTE — Plan of Care (Signed)
  Problem: Education: Goal: Knowledge of General Education information will improve Description: Including pain rating scale, medication(s)/side effects and non-pharmacologic comfort measures Outcome: Progressing   Problem: Health Behavior/Discharge Planning: Goal: Ability to manage health-related needs will improve Outcome: Progressing   Problem: Coping: Goal: Level of anxiety will decrease Outcome: Progressing   Problem: Elimination: Goal: Will not experience complications related to urinary retention Outcome: Progressing   Problem: Pain Management: Goal: General experience of comfort will improve Outcome: Progressing   Problem: Safety: Goal: Ability to remain free from injury will improve Outcome: Progressing   Problem: Respiratory: Goal: Ability to maintain adequate ventilation will improve Outcome: Progressing   Problem: Education: Goal: Knowledge of General Education information will improve Description: Including pain rating scale, medication(s)/side effects and non-pharmacologic comfort measures Outcome: Progressing   Problem: Health Behavior/Discharge Planning: Goal: Ability to manage health-related needs will improve Outcome: Progressing   Problem: Clinical Measurements: Goal: Respiratory complications will improve Outcome: Progressing   Problem: Activity: Goal: Risk for activity intolerance will decrease Outcome: Progressing   Problem: Coping: Goal: Level of anxiety will decrease Outcome: Progressing   Problem: Elimination: Goal: Will not experience complications related to bowel motility Outcome: Progressing Goal: Will not experience complications related to urinary retention Outcome: Progressing   Problem: Pain Management: Goal: General experience of comfort will improve Outcome: Progressing   Problem: Safety: Goal: Ability to remain free from injury will improve Outcome: Progressing

## 2023-12-08 NOTE — Progress Notes (Signed)
PROGRESS NOTE [name]  ZOX:[id number] DOB: Jul 28, 1951 DOA: 11/26/2023 PCP: Rica Records, FNP  Brief Narrative/Hospital Course: [name] is a 72 y.o. male with a history of hypertension, hyperlipidemia, PAD status post right AKA, CKD stage IIIa, GERD, prostate cancer status post prostatectomy.  Patient presented secondary to being found down on the ground and altered and was found to have evidence of hypotension concerning for septic shock with additional evidence of rhabdomyolysis.  Patient started empirically on antibiotics, started on vasopressor support, and IV fluids and was transferred to [facility] from [facility] for ICU admission.  Antibiotics were discontinued secondary to unlikely infectious etiology.  Hypotension and rhabdomyolysis improved with IV fluids and vasopressor support was discontinued.  Admission complicated by the identification of metastatic disease of unknown primary. Calcium worsening s/p Zometa.  Following Zometa calcium has significantly improved, patient had downtrending of hemoglobin-will need PRBC transfusion 12/13 and hemoglobin overall stable 7.4-8 gm.  Overall prognosis does not appear bright, he is waiting for placement   Subjective: Patient seen and examined this morning Resting comfortably. No new complaints Overnight patient afebrile BP 90s to low 100, no new complaints  patient seen and examined this morning  Assessment and Plan: Principal Problem:   Sepsis (HCC) Active Problems:   Normocytic anemia   Hypovolemic shock (HCC)   Rhabdomyolysis   Pressure injury of skin   AKI (acute kidney injury) (HCC)   DVT (deep venous thrombosis) (HCC)   GERD (gastroesophageal reflux disease)   Transaminitis   Malnutrition of moderate degree   Metastatic adenocarcinoma (HCC)  Hypercalcemia of malignancy: Calcium is still high but improving nicely, has low albumin, likely related to metastatic bone  disease ?Intermittent confusion but currently mental status stable/improved calcium significantly improved with Zometa x 1 on 12/04/23.Cont iv Lasix as tolerated. Recent Labs  Lab 12/04/23 0559 12/05/23 8119 12/06/23 0614 12/07/23 0716 12/08/23 0629  CALCIUM 11.5* 11.0* 10.4* 9.9 9.4    Hypophosphatemia:Resolved   Poorly differentiated adenocarcinoma of unknown primary History of prostate cancer-f/b Dr Ellin Saba: Soft tissue mass anterior abdominal wall biopsy positive for malignancy as above.  Oncology following. S/p  CT C-spine head/A/P and maxillofacial done on 12/2- shows large lytic lesion T1 likely mets with likely epidural spread, multiple enlarged cervical lymph nodes and neck by adrenal masses multiple bone mets. CT chest 12/3 right hilar mass adenopathy right adrenal mets.Underwent GI eval with EGD 12/7 negative for upper GI malignancy.  Seen by Dr. Leigh Aurora on TCU ID, [employer] to see if he is a candidate for targeted therapy or immunotherapy- will be followed up outpatient.  Acute DVT right upper extremity: Age indeterminate left lower extremity DVT/Chronic right lower extremity DVT Continue Eliquis and will monitor hemoglobin.  Normocytic anemia Anemia of malignancy: Multifactorial due to malignancy, infections,CKD.  S/ p1 unit PRBC 12/13-hemoglobin stable monitor and transfuse if less than 7 g.  No obvious bleeding noted but he is on Eliquis, had EGD on 12/7. Recent Labs  Lab 12/05/23 0642 12/06/23 0614 12/07/23 0716 12/07/23 1858 12/08/23 0629  HGB 8.0* 7.3* 6.2* 8.2* 7.4*  HCT 25.2* 23.0* 19.5* 25.7* 23.2*    Hypovolemic shock Sepsis ruled out. Patient managed in ICU with norepinephrine and fluid resuscitation.  Vitals stable.  Patient Elevin overnight.  Traumatic rhabdomyolysis Secondary to being down. CK of 6,948 on admission.Last CK5 as below. Associated elevated AST/ALT. Improvement with IV fluids.  No results for input(s):  "CKTOTAL" in the last 168 hours.  AKI Creatinine peaked to 2.2 likely AKI now improved Recent Labs    11/27/23 0859 11/28/23 0324 11/29/23 0659 11/30/23 0528 12/03/23 0702 12/04/23 0559 12/05/23 0642 12/06/23 0614 12/07/23 0716 12/08/23 0629  BUN 60* 43* 29* 24* 21 20 21  26* 34* 33*  CREATININE 1.38* 1.19 0.97 0.97 0.82 1.02 0.91 0.90 1.19 0.97  CO2 26 27 25 22  20* 20* 22 23 21* 23  K 3.7 3.6 3.8 3.9 4.4 4.8 4.5 4.7 4.3 4.4    GERD Continue Protonix.  HTN: Remains soft continue to hold antihypertensives   Hyponatremia Hypovolemic, encourage oral intake    Hyperlipidemia Holding Lipitor   PAD right AKA: Cont asa    Scrotal cellulitis/candidiasis MRI without deep infection, abscess, or features necrotizing disease. Infection appears to be improving. Blood cultures (12/6) are no growth to date. Continue Nystatin, Doxycycline and Augmentin and course has been completed 12/06/23.   Sepsis Unclear is present on admission, but recurrent criteria met during hospitalization secondary to scrotal infection. Blood cultures obtained. Empiric vancomycin and Cefepime started for treatment. Blood cultures (12/6) are no growth to date.   Phantom limb pain Noted.   Foley catheter in place Placed secondary to scrotal wound and significant edema. -Discontinue Foley catheter today or tomorrow    Moderate malnutrition Continue to augment nutritional status, RD following Nutrition Problem: Moderate Malnutrition Etiology: acute illness Signs/Symptoms: estimated needs, moderate fat depletion, moderate muscle depletion Interventions: Ensure Enlive (each supplement provides 350kcal and 20 grams of protein), MVI, Liberalize Diet  Goals of care: Currently full code but his prognosis is not bright, will benefit with palliative care evaluation  Pressure injury.  Pretibial left stage II POA wound care as below Foley catheter in place to prevent moisture- will do TOV Sunday  12/15 Pressure Injury 11/26/23 Pretibial Distal;Left;Medial Stage 2 -  Partial thickness loss of dermis presenting as a shallow open injury with a red, pink wound bed without slough. Edematous excoriated area (Active)  11/26/23 1845  Location: Pretibial  Location Orientation: Distal;Left;Medial  Staging: Stage 2 -  Partial thickness loss of dermis presenting as a shallow open injury with a red, pink wound bed without slough.  Wound Description (Comments): Edematous excoriated area  Present on Admission: Yes  Dressing Type Foam - Lift dressing to assess site every shift 12/04/23 1400    DVT prophylaxis: Eliquis Code Status:   Code Status: Full Code Family Communication: plan of care discussed with patient  at bedside.  I had called patient's sister over the phone no answer left message for call back 12/13 called again 12/14 no answer. Patient is aware of POC. Patient status is: Remains hospitalized because of severity of illness Level of care: Med-Surg   Dispo: The patient is from: home.  Sister is helping with decision for SNF            Anticipated disposition: SNF once bed available.  Likely Monday  Objective: Vitals last 24 hrs: Vitals:   12/08/23 0017 12/08/23 0543 12/08/23 0558 12/08/23 0830  BP: (!) 99/48 (!) 105/42  (!) 94/53  Pulse: 100 98  (!) 101  Resp: 18 18  18   Temp: 98.7 F (37.1 C) 98.9 F (37.2 C)  99.1 F (37.3 C)  TempSrc: Oral     SpO2: 96% 97%  95%  Weight:   92.5 kg   Height:       Weight change: 0.4 kg  Physical Examination: General exam: alert awake ill appearing HEENT:Oral mucosa moist, Ear/Nose WNL grossly Respiratory  system: Bilaterally clear BS,no use of accessory muscle Cardiovascular system: S1 & S2 +, No JVD. Gastrointestinal system: Abdomen soft,NT,ND, BS+ Nervous System: Alert, awake, moving all extremities,and following commands. Extremities: LE edema neg,distal peripheral pulses palpable and warm.  Skin: No rashes,no icterus. MSK:  Normal muscle bulk,tone, power  Foley+  Medications reviewed:  Scheduled Meds:  apixaban  10 mg Oral BID   Followed by   Melene Muller ON 12/10/2023] apixaban  5 mg Oral BID   aspirin  81 mg Oral Daily   Chlorhexidine Gluconate Cloth  6 each Topical Daily   docusate sodium  100 mg Oral BID   feeding supplement  237 mL Oral QHS   furosemide  20 mg Intravenous Daily   Gerhardt's butt cream   Topical BID   multivitamin with minerals  1 tablet Oral Daily   nystatin   Topical BID   Continuous Infusions:  Diet Order             Diet regular Room service appropriate? Yes; Fluid consistency: Thin  Diet effective now                   Intake/Output Summary (Last 24 hours) at 12/08/2023 0904 Last data filed at 12/08/2023 0851 Gross per 24 hour  Intake 808.75 ml  Output 1225 ml  Net -416.25 ml   Net IO Since Admission: -522.78 mL [12/08/23 0904]  Wt Readings from Last 3 Encounters:  12/08/23 92.5 kg  11/12/23 96.6 kg  10/31/23 98.9 kg     Unresulted Labs (From admission, onward)     Start     Ordered   12/06/23 0500  Comprehensive metabolic panel  Daily,   R     Question:  Specimen collection method  Answer:  Lab=Lab collect   12/05/23 1142   12/06/23 0500  CBC  Daily,   R     Question:  Specimen collection method  Answer:  Lab=Lab collect   12/05/23 1142          Data Reviewed: I have personally reviewed following labs and imaging studies CBC: Recent Labs  Lab 12/02/23 0703 12/05/23 0642 12/06/23 0614 12/07/23 0716 12/07/23 1858 12/08/23 0629  WBC 12.6* 13.1* 13.4* 11.3*  --  11.5*  HGB 8.0* 8.0* 7.3* 6.2* 8.2* 7.4*  HCT 25.1* 25.2* 23.0* 19.5* 25.7* 23.2*  MCV 89.6 89.7 88.8 89.0  --  88.9  PLT 293 366 390 333  --  375   Basic Metabolic Panel:  Recent Labs  Lab 12/04/23 0559 12/05/23 0642 12/06/23 0614 12/06/23 2045 12/07/23 0716 12/08/23 0629  NA 129* 131* 130*  --  132* 131*  K 4.8 4.5 4.7  --  4.3 4.4  CL 103 103 102  --  103 101  CO2 20* 22 23   --  21* 23  GLUCOSE 104* 101* 108*  --  127* 119*  BUN 20 21 26*  --  34* 33*  CREATININE 1.02 0.91 0.90  --  1.19 0.97  CALCIUM 11.5* 11.0* 10.4*  --  9.9 9.4  MG  --   --  2.1  --   --   --   PHOS  --   --  1.5* 3.1  --   --    GFR: Estimated Creatinine Clearance: 77.5 mL/min (by C-G formula based on SCr of 0.97 mg/dL). Liver Function Tests:  Recent Labs  Lab 12/03/23 0702 12/05/23 1308 12/06/23 0614 12/07/23 0716 12/08/23 0629  AST  --  43* 57*  51* 50*  ALT  --  67* 75* 72* 73*  ALKPHOS  --  93 100 83 100  BILITOT  --  0.4 0.5 0.6 0.7  PROT  --  5.4* 5.4* 5.3* 5.4*  ALBUMIN 1.5* 1.6* 1.5* 2.0* 1.8*  Sepsis Labs: No results for input(s): "PROCALCITON", "LATICACIDVEN" in the last 168 hours. Recent Results (from the past 240 hours)  Culture, blood (Routine X 2) w Reflex to ID Panel     Status: None   Collection Time: 11/30/23  5:25 AM   Specimen: BLOOD RIGHT ARM  Result Value Ref Range Status   Specimen Description BLOOD RIGHT ARM  Final   Special Requests   Final    BOTTLES DRAWN AEROBIC AND ANAEROBIC Blood Culture results may not be optimal due to an inadequate volume of blood received in culture bottles   Culture   Final    NO GROWTH 5 DAYS Performed at [facility], [address], [place], [place] [address]    Report Status 12/05/2023 FINAL  Final  Culture, blood (Routine X 2) w Reflex to ID Panel     Status: None   Collection Time: 11/30/23  5:26 AM   Specimen: BLOOD RIGHT ARM  Result Value Ref Range Status   Specimen Description BLOOD RIGHT ARM  Final   Special Requests   Final    BOTTLES DRAWN AEROBIC AND ANAEROBIC Blood Culture results may not be optimal due to an inadequate volume of blood received in culture bottles   Culture   Final    NO GROWTH 5 DAYS Performed at [facility], [address] [place], [place] [address]    Report Status 12/05/2023 FINAL  Final    Antimicrobials/Microbiology: Anti-infectives (From admission, onward)     Start     Dose/Rate Route Frequency Ordered Stop   12/03/23 1245  doxycycline (VIBRA-TABS) tablet 100 mg        100 mg Oral Every 12 hours 12/03/23 1155 12/06/23 2125   12/03/23 1245  amoxicillin-clavulanate (AUGMENTIN) 875-125 MG per tablet 1 tablet        1 tablet Oral Every 12 hours 12/03/23 1155 12/06/23 2125   11/30/23 1800  vancomycin (VANCOREADY) IVPB 750 mg/150 mL  Status:  Discontinued        750 mg 150 mL/hr over 60 Minutes Intravenous Every 12 hours 11/30/23 0234 12/03/23 1155   11/30/23 0430  vancomycin (VANCOCIN) IVPB 1000 mg/200 mL premix       Placed in "Followed by" Linked Group   1,000 mg 200 mL/hr over 60 Minutes Intravenous  Once 11/30/23 0237 11/30/23 0659   11/30/23 0400  ceFEPIme (MAXIPIME) 2 g in sodium chloride 0.9 % 100 mL IVPB  Status:  Discontinued        2 g 200 mL/hr over 30 Minutes Intravenous Every 8 hours 11/30/23 0234 12/03/23 1155   11/30/23 0330  vancomycin (VANCOREADY) IVPB 2000 mg/400 mL  Status:  Discontinued        2,000 mg 200 mL/hr over 120 Minutes Intravenous  Once 11/30/23 0234 11/30/23 0236   11/30/23 0330  vancomycin (VANCOCIN) IVPB 1000 mg/200 mL premix       Placed in "Followed by" Linked Group   1,000 mg 200 mL/hr over 60 Minutes Intravenous  Once 11/30/23 0237 11/30/23 0558   11/28/23 1300  vancomycin (VANCOREADY) IVPB 1500 mg/300 mL  Status:  Discontinued        1,500 mg 150 mL/hr over 120 Minutes Intravenous Every  48 hours 11/26/23 1301 11/27/23 1107   11/26/23 2000  piperacillin-tazobactam (ZOSYN) IVPB 3.375 g  Status:  Discontinued        3.375 g 12.5 mL/hr over 240 Minutes Intravenous Every 8 hours 11/26/23 1850 11/28/23 0921   11/26/23 1330  vancomycin (VANCOCIN) IVPB 1000 mg/200 mL premix        1,000 mg 200 mL/hr over 60 Minutes Intravenous Every 1 hr x 2 11/26/23 1251 11/26/23 1617   11/26/23 1245  ceFEPIme (MAXIPIME) 2 g in sodium chloride 0.9 % 100 mL IVPB        2 g 200 mL/hr over 30 Minutes Intravenous  Once 11/26/23 1243  11/26/23 1420         Component Value Date/Time   SDES BLOOD RIGHT ARM 11/30/2023 0526   SPECREQUEST  11/30/2023 0526    BOTTLES DRAWN AEROBIC AND ANAEROBIC Blood Culture results may not be optimal due to an inadequate volume of blood received in culture bottles   CULT  11/30/2023 0526    NO GROWTH 5 DAYS Performed at [facility], [address] [place], [place] [address]    REPTSTATUS 12/05/2023 FINAL 11/30/2023 3875     Radiology Studies: No results found.   LOS: 12 days   Total time spent in review of labs and imaging, patient evaluation, formulation of plan, documentation and communication with family: 35 minutes  Lanae Boast, MD [facility]  12/08/2023, 9:04 AM

## 2023-12-08 NOTE — Plan of Care (Signed)

## 2023-12-09 DIAGNOSIS — R6521 Severe sepsis with septic shock: Secondary | ICD-10-CM | POA: Diagnosis not present

## 2023-12-09 DIAGNOSIS — Z7189 Other specified counseling: Secondary | ICD-10-CM | POA: Diagnosis not present

## 2023-12-09 DIAGNOSIS — Z515 Encounter for palliative care: Secondary | ICD-10-CM | POA: Diagnosis not present

## 2023-12-09 DIAGNOSIS — A419 Sepsis, unspecified organism: Secondary | ICD-10-CM | POA: Diagnosis not present

## 2023-12-09 LAB — CBC
HCT: 22 % — ABNORMAL LOW (ref 39.0–52.0)
Hemoglobin: 7.1 g/dL — ABNORMAL LOW (ref 13.0–17.0)
MCH: 28.9 pg (ref 26.0–34.0)
MCHC: 32.3 g/dL (ref 30.0–36.0)
MCV: 89.4 fL (ref 80.0–100.0)
Platelets: 360 10*3/uL (ref 150–400)
RBC: 2.46 MIL/uL — ABNORMAL LOW (ref 4.22–5.81)
RDW: 17.3 % — ABNORMAL HIGH (ref 11.5–15.5)
WBC: 10.4 10*3/uL (ref 4.0–10.5)
nRBC: 0 % (ref 0.0–0.2)

## 2023-12-09 LAB — COMPREHENSIVE METABOLIC PANEL
ALT: 76 U/L — ABNORMAL HIGH (ref 0–44)
AST: 51 U/L — ABNORMAL HIGH (ref 15–41)
Albumin: 1.7 g/dL — ABNORMAL LOW (ref 3.5–5.0)
Alkaline Phosphatase: 111 U/L (ref 38–126)
Anion gap: 6 (ref 5–15)
BUN: 33 mg/dL — ABNORMAL HIGH (ref 8–23)
CO2: 23 mmol/L (ref 22–32)
Calcium: 9.2 mg/dL (ref 8.9–10.3)
Chloride: 104 mmol/L (ref 98–111)
Creatinine, Ser: 1.23 mg/dL (ref 0.61–1.24)
GFR, Estimated: >60 mL/min (ref >60)
Glucose, Bld: 108 mg/dL — ABNORMAL HIGH (ref 70–99)
Potassium: 3.9 mmol/L (ref 3.5–5.1)
Sodium: 133 mmol/L — ABNORMAL LOW (ref 135–145)
Total Bilirubin: 0.7 mg/dL (ref <1.2)
Total Protein: 5.4 g/dL — ABNORMAL LOW (ref 6.5–8.1)

## 2023-12-09 MED ORDER — FUROSEMIDE 20 MG PO TABS
20.0000 mg | ORAL_TABLET | Freq: Every day | ORAL | Status: DC
  Administered 2023-12-10 – 2023-12-11 (×2): 20 mg via ORAL
  Filled 2023-12-09 (×2): qty 1

## 2023-12-09 NOTE — Progress Notes (Signed)
   Palliative Medicine Inpatient Follow Up Note  HPI: [name] is a 72 y.o. male with a history of hypertension, hyperlipidemia, PAD status post right AKA, CKD stage IIIa, GERD, prostate cancer status post prostatectomy. Patient presented secondary to being found down on the ground and altered and was found to have evidence of hypotension concerning for septic shock with additional evidence of rhabdomyolysis.    Palliative care has been asked to get involved for further goals of care conversations.   Today's Discussion 12/09/2023  *Please note that this is a verbal dictation therefore any spelling or grammatical errors are due to the "[employer] One" system interpretation.  Chart reviewed inclusive of vital signs, progress notes, laboratory results, and diagnostic images.   I met with [name] this morning in the presence of his bedside nurse, Denny Peon.  Tin shares that he is feeling well today and denies pain, shortness of breath, or nausea.  Created space and opportunity for patient to explore thoughts feelings and fears regarding current medical situation.  Reviewed the plan for [name] to transition to skilled nursing and for oncology follow-up to be as an outpatient.  Questions and concerns addressed/Palliative Support Provided.   Objective Assessment: Vital Signs Vitals:   12/09/23 0505 12/09/23 1213  BP: (!) 101/50 (!) 102/50  Pulse: 100 98  Resp: 17 16  Temp: 99.1 F (37.3 C) 99.3 F (37.4 C)  SpO2: 92% 93%   No intake or output data in the 24 hours ending 12/09/23 1350 Last Weight  Most recent update: 12/09/2023  5:29 AM    Weight  93.5 kg (206 lb 2.1 oz)            Gen: Elderly African-American male in no acute distress HEENT: moist mucous membranes CV: Regular rate and rhythm  PULM: On room air breathing is even and nonlabored ABD: soft/nontender  EXT: Right AKA Neuro: Alert and oriented x3  SUMMARY OF RECOMMENDATIONS   DNAR/DNI    Patient's goals are for rehabilitation and to see if his present cancer is treatable   Will refer to outpatient palliative symptom management clinic --> [name]   Ongoing palliative care support  Time: 25 ______________________________________________________________________________________ [name] Palliative Medicine Team Team Cell Phone: [phone] Please utilize secure chat with additional questions, if there is no response within 30 minutes please call the above phone number  Palliative Medicine Team providers are available by phone from 7am to 7pm daily and can be reached through the team cell phone.  Should this patient require assistance outside of these hours, please call the patient's attending physician.

## 2023-12-09 NOTE — Plan of Care (Signed)

## 2023-12-09 NOTE — Progress Notes (Signed)
PROGRESS NOTE [name]  QMV:[id number] DOB: 11-30-1951 DOA: 11/26/2023 PCP: Rica Records, FNP  Brief Narrative/Hospital Course: [name] is a 72 y.o. male with a history of hypertension, hyperlipidemia, PAD status post right AKA, CKD stage IIIa, GERD, prostate cancer status post prostatectomy.  Patient presented secondary to being found down on the ground and altered and was found to have evidence of hypotension concerning for septic shock with additional evidence of rhabdomyolysis.  Patient started empirically on antibiotics, started on vasopressor support, and IV fluids and was transferred to [facility] from [name] for ICU admission.  Antibiotics were discontinued secondary to unlikely infectious etiology.  Hypotension and rhabdomyolysis improved with IV fluids and vasopressor support was discontinued.  Admission complicated by the identification of metastatic disease of unknown primary. Calcium worsening s/p Zometa.  Following Zometa calcium has significantly improved, patient had downtrending of hemoglobin-will need PRBC transfusion 12/13 and hemoglobin overall stable 7.4-8 gm.  Overall prognosis does not appear bright, he is waiting for placement   Subjective: Patient seen and examined He is alert awake resting comfortably Denies any new complaints Past 24 hours BP has been 90s to 100, afebrile, not hypoxic Appears weak and frail  Assessment and Plan: Principal Problem:   Sepsis (HCC) Active Problems:   Normocytic anemia   Hypovolemic shock (HCC)   Rhabdomyolysis   Pressure injury of skin   AKI (acute kidney injury) (HCC)   DVT (deep venous thrombosis) (HCC)   GERD (gastroesophageal reflux disease)   Transaminitis   Malnutrition of moderate degree   Metastatic adenocarcinoma (HCC)  Hypercalcemia of malignancy: W/ ?Intermittent confusion but currently mental status improved.  S/P Zometa on 12/04/23 and on iv lasix. Calcium  significantly improved 9.4. Recent Labs  Lab 12/05/23 [phone] 12/06/23 0614 12/07/23 0716 12/08/23 0629 12/09/23 0756  CALCIUM 11.0* 10.4* 9.9 9.4 9.2    Hypophosphatemia:Resolved   Poorly differentiated adenocarcinoma of unknown primary History of prostate cancer-f/b Dr Ellin Saba: Soft tissue mass anterior abdominal wall biopsy positive for malignancy as above.  Oncology following. S/p  CT C-spine head/A/P and maxillofacial done on 12/2- shows large lytic lesion T1 likely mets with likely epidural spread, multiple enlarged cervical lymph nodes and neck by adrenal masses multiple bone mets. CT chest 12/3 right hilar mass adenopathy right adrenal mets. Underwent GI eval with EGD 12/7 negative for upper GI malignancy. Seen by Dr. Leigh Aurora on further ID/[employer] sequencing [employer] to see if he is a candidate for targeted therapy or immunotherapy- and will be decided on outpatient basis.  Given overall poor/guarded prognosis palliative care has seen the patient and now patient is DNR/DNI and will fu w/ OP PMT  Acute DVT right upper extremity: Age indeterminate left lower extremity DVT/Chronic right lower extremity DVT Continue Eliquis and will monitor hemoglobin.  Normocytic anemia Anemia of malignancy: Multifactorial due to malignancy, infections,CKD.  S/p1 unit PRBC 12/13- and improved, transfused < 7 gm.No obvious bleeding, but on Eliquis,had EGD on 12/7. Recent Labs  Lab 12/06/23 0614 12/07/23 0716 12/07/23 1858 12/08/23 0629 12/09/23 0756  HGB 7.3* 6.2* 8.2* 7.4* 7.1*  HCT 23.0* 19.5* 25.7* 23.2* 22.0*    Hypovolemic shock Sepsis ruled out. Patient managed in ICU with norepinephrine and fluid resuscitation.  Vitals stable.  Patient Elevin overnight.  Traumatic rhabdomyolysis Secondary to being down. CK of 6,948 on admission.Last CK5 as below. Associated elevated AST/ALT. Improvement with IV fluids.  No results for input(s): "CKTOTAL" in the last 168 hours.  AKI Creatinine peaked to 2.2 likely AKI now improved Recent Labs    11/28/23 0324 11/29/23 0659 11/30/23 0528 12/03/23 0702 12/04/23 0559 12/05/23 0642 12/06/23 0614 12/07/23 0716 12/08/23 0629 12/09/23 0756  BUN 43* 29* 24* 21 20 21  26* 34* 33* 33*  CREATININE 1.19 0.97 0.97 0.82 1.02 0.91 0.90 1.19 0.97 1.23  CO2 27 25 22  20* 20* 22 23 21* 23 23  K 3.6 3.8 3.9 4.4 4.8 4.5 4.7 4.3 4.4 3.9    GERD Continue Protonix.  HTN: Remains soft continue to hold antihypertensives   Hyponatremia Hypovolemic, encourage oral intake    Hyperlipidemia Holding Lipitor   PAD right AKA: Cont asa    Scrotal cellulitis/candidiasis MRI without deep infection, abscess, or features necrotizing disease. Infection appears to be improving. Blood cultures (12/6) are no growth to date.  Antibiotics completed 12/06/23.   Sepsis Unclear is present on admission, but recurrent criteria met during hospitalization secondary to scrotal infection. Blood cultures obtained. Empiric vancomycin and Cefepime started for treatment. Blood cultures (12/6) are no growth to date.   Phantom limb pain Noted.   Foley catheter in place Placed secondary to scrotal wound and significant edema.  Removing foley   Moderate malnutrition Continue to augment nutritional status, RD following Nutrition Problem: Moderate Malnutrition Etiology: acute illness Signs/Symptoms: estimated needs, moderate fat depletion, moderate muscle depletion Interventions: Ensure Enlive (each supplement provides 350kcal and 20 grams of protein), MVI, Liberalize Diet  Goals of care: Changed to DNR 12/14, overall prognosis poor  He will follow-up with outpatient palliative care and hematology oncology   Pressure injury.  Pretibial left stage II POA wound care as below Foley catheter in place to prevent moisture- will do TOV Sunday 12/15 Pressure Injury 11/26/23 Pretibial Distal;Left;Medial Stage 2 -  Partial thickness loss of dermis  presenting as a shallow open injury with a red, pink wound bed without slough. Edematous excoriated area (Active)  11/26/23 1845  Location: Pretibial  Location Orientation: Distal;Left;Medial  Staging: Stage 2 -  Partial thickness loss of dermis presenting as a shallow open injury with a red, pink wound bed without slough.  Wound Description (Comments): Edematous excoriated area  Present on Admission: Yes  Dressing Type Foam - Lift dressing to assess site every shift 12/04/23 1400    DVT prophylaxis: Eliquis Code Status:   Code Status: Limited: Do not attempt resuscitation (DNR) -DNR-LIMITED -Do Not Intubate/DNI  Family Communication: plan of care discussed with patient  at bedside.  I had called patient's sister over the phone no answer left message for call back 12/13 called again 12/14  but no answer. Patient is aware of POC. Patient status is: Remains hospitalized because of severity of illness Level of care: Med-Surg   Dispo: The patient is from: home.  Sister is helping with decision for SNF            Anticipated disposition: SNF Monday  Objective: Vitals last 24 hrs: Vitals:   12/08/23 1031 12/08/23 1515 12/08/23 1936 12/09/23 0505  BP: (!) 96/59 (!) 99/54 108/68 (!) 101/50  Pulse: 100 98 98 100  Resp: 16 18 18 17   Temp: 98.6 F (37 C) 98.8 F (37.1 C) 99.5 F (37.5 C) 99.1 F (37.3 C)  TempSrc: Oral Oral Oral   SpO2: 93% 96% 94% 92%  Weight:    93.5 kg  Height:       Weight change: 1 kg  Physical Examination: General exam: alert awake, ill and frail appearing, oriented at baseline, older  than stated age HEENT:Oral mucosa moist, Ear/Nose WNL grossly Respiratory system: Bilaterally clear BS,no use of accessory muscle Cardiovascular system: S1 & S2 +, No JVD. Gastrointestinal system: Abdomen soft,NT,ND, BS+ Nervous System: Alert, awake, moving all extremities,and following commands. Extremities: LE edema mild,distal peripheral pulses palpable and warm.  Skin: No  rashes,no icterus. MSK: Normal muscle bulk,tone, power   Medications reviewed:  Scheduled Meds:  apixaban  10 mg Oral BID   Followed by   Melene Muller ON 12/10/2023] apixaban  5 mg Oral BID   aspirin  81 mg Oral Daily   Chlorhexidine Gluconate Cloth  6 each Topical Daily   docusate sodium  100 mg Oral BID   feeding supplement  237 mL Oral QHS   [START ON 12/10/2023] furosemide  20 mg Oral Daily   Gerhardt's butt cream   Topical BID   multivitamin with minerals  1 tablet Oral Daily   nystatin   Topical BID   Continuous Infusions:  Diet Order             Diet regular Room service appropriate? Yes; Fluid consistency: Thin  Diet effective now                   Intake/Output Summary (Last 24 hours) at 12/09/2023 1122 Last data filed at 12/08/2023 1200 Gross per 24 hour  Intake --  Output 450 ml  Net -450 ml   Net IO Since Admission: -972.78 mL [12/09/23 1122]  Wt Readings from Last 3 Encounters:  12/09/23 93.5 kg  11/12/23 96.6 kg  10/31/23 98.9 kg     Unresulted Labs (From admission, onward)     Start     Ordered   12/06/23 0500  Comprehensive metabolic panel  Daily,   R     Question:  Specimen collection method  Answer:  Lab=Lab collect   12/05/23 1142   12/06/23 0500  CBC  Daily,   R     Question:  Specimen collection method  Answer:  Lab=Lab collect   12/05/23 1142          Data Reviewed: I have personally reviewed following labs and imaging studies CBC: Recent Labs  Lab 12/05/23 0642 12/06/23 0614 12/07/23 0716 12/07/23 1858 12/08/23 0629 12/09/23 0756  WBC 13.1* 13.4* 11.3*  --  11.5* 10.4  HGB 8.0* 7.3* 6.2* 8.2* 7.4* 7.1*  HCT 25.2* 23.0* 19.5* 25.7* 23.2* 22.0*  MCV 89.7 88.8 89.0  --  88.9 89.4  PLT 366 390 333  --  375 360   Basic Metabolic Panel:  Recent Labs  Lab 12/05/23 0642 12/06/23 0614 12/06/23 2045 12/07/23 0716 12/08/23 0629 12/09/23 0756  NA 131* 130*  --  132* 131* 133*  K 4.5 4.7  --  4.3 4.4 3.9  CL 103 102  --  103 101  104  CO2 22 23  --  21* 23 23  GLUCOSE 101* 108*  --  127* 119* 108*  BUN 21 26*  --  34* 33* 33*  CREATININE 0.91 0.90  --  1.19 0.97 1.23  CALCIUM 11.0* 10.4*  --  9.9 9.4 9.2  MG  --  2.1  --   --   --   --   PHOS  --  1.5* 3.1  --   --   --    GFR: Estimated Creatinine Clearance: 61.4 mL/min (by C-G formula based on SCr of 1.23 mg/dL). Liver Function Tests:  Recent Labs  Lab 12/05/23 [phone] 12/06/23 [phone] 12/07/23 [phone]  12/08/23 0629 12/09/23 0756  AST 43* 57* 51* 50* 51*  ALT 67* 75* 72* 73* 76*  ALKPHOS 93 100 83 100 111  BILITOT 0.4 0.5 0.6 0.7 0.7  PROT 5.4* 5.4* 5.3* 5.4* 5.4*  ALBUMIN 1.6* 1.5* 2.0* 1.8* 1.7*  Sepsis Labs: No results for input(s): "PROCALCITON", "LATICACIDVEN" in the last 168 hours. Recent Results (from the past 240 hours)  Culture, blood (Routine X 2) w Reflex to ID Panel     Status: None   Collection Time: 11/30/23  5:25 AM   Specimen: BLOOD RIGHT ARM  Result Value Ref Range Status   Specimen Description BLOOD RIGHT ARM  Final   Special Requests   Final    BOTTLES DRAWN AEROBIC AND ANAEROBIC Blood Culture results may not be optimal due to an inadequate volume of blood received in culture bottles   Culture   Final    NO GROWTH 5 DAYS Performed at [facility], [address] [place], [place] [address]    Report Status 12/05/2023 FINAL  Final  Culture, blood (Routine X 2) w Reflex to ID Panel     Status: None   Collection Time: 11/30/23  5:26 AM   Specimen: BLOOD RIGHT ARM  Result Value Ref Range Status   Specimen Description BLOOD RIGHT ARM  Final   Special Requests   Final    BOTTLES DRAWN AEROBIC AND ANAEROBIC Blood Culture results may not be optimal due to an inadequate volume of blood received in culture bottles   Culture   Final    NO GROWTH 5 DAYS Performed at [facility], [address] [place], [place] [address]    Report Status 12/05/2023 FINAL  Final    Antimicrobials/Microbiology: Anti-infectives (From admission,  onward)    Start     Dose/Rate Route Frequency Ordered Stop   12/03/23 1245  doxycycline (VIBRA-TABS) tablet 100 mg        100 mg Oral Every 12 hours 12/03/23 1155 12/06/23 2125   12/03/23 1245  amoxicillin-clavulanate (AUGMENTIN) 875-125 MG per tablet 1 tablet        1 tablet Oral Every 12 hours 12/03/23 1155 12/06/23 2125   11/30/23 1800  vancomycin (VANCOREADY) IVPB 750 mg/150 mL  Status:  Discontinued        750 mg 150 mL/hr over 60 Minutes Intravenous Every 12 hours 11/30/23 0234 12/03/23 1155   11/30/23 0430  vancomycin (VANCOCIN) IVPB 1000 mg/200 mL premix       Placed in "Followed by" Linked Group   1,000 mg 200 mL/hr over 60 Minutes Intravenous  Once 11/30/23 0237 11/30/23 0659   11/30/23 0400  ceFEPIme (MAXIPIME) 2 g in sodium chloride 0.9 % 100 mL IVPB  Status:  Discontinued        2 g 200 mL/hr over 30 Minutes Intravenous Every 8 hours 11/30/23 0234 12/03/23 1155   11/30/23 0330  vancomycin (VANCOREADY) IVPB 2000 mg/400 mL  Status:  Discontinued        2,000 mg 200 mL/hr over 120 Minutes Intravenous  Once 11/30/23 0234 11/30/23 0236   11/30/23 0330  vancomycin (VANCOCIN) IVPB 1000 mg/200 mL premix       Placed in "Followed by" Linked Group   1,000 mg 200 mL/hr over 60 Minutes Intravenous  Once 11/30/23 0237 11/30/23 0558   11/28/23 1300  vancomycin (VANCOREADY) IVPB 1500 mg/300 mL  Status:  Discontinued        1,500 mg 150 mL/hr over 120 Minutes Intravenous  Every 48 hours 11/26/23 1301 11/27/23 1107   11/26/23 2000  piperacillin-tazobactam (ZOSYN) IVPB 3.375 g  Status:  Discontinued        3.375 g 12.5 mL/hr over 240 Minutes Intravenous Every 8 hours 11/26/23 1850 11/28/23 0921   11/26/23 1330  vancomycin (VANCOCIN) IVPB 1000 mg/200 mL premix        1,000 mg 200 mL/hr over 60 Minutes Intravenous Every 1 hr x 2 11/26/23 1251 11/26/23 1617   11/26/23 1245  ceFEPIme (MAXIPIME) 2 g in sodium chloride 0.9 % 100 mL IVPB        2 g 200 mL/hr over 30 Minutes Intravenous  Once  11/26/23 1243 11/26/23 1420         Component Value Date/Time   SDES BLOOD RIGHT ARM 11/30/2023 0526   SPECREQUEST  11/30/2023 0526    BOTTLES DRAWN AEROBIC AND ANAEROBIC Blood Culture results may not be optimal due to an inadequate volume of blood received in culture bottles   CULT  11/30/2023 0526    NO GROWTH 5 DAYS Performed at [facility], [address], [place], [place] [address]    REPTSTATUS 12/05/2023 FINAL 11/30/2023 6045     Radiology Studies: No results found.   LOS: 13 days   Total time spent in review of labs and imaging, patient evaluation, formulation of plan, documentation and communication with family: 35 minutes  Lanae Boast, MD [facility]  12/09/2023, 11:22 AM

## 2023-12-09 NOTE — Plan of Care (Signed)
  Problem: Education: Goal: Knowledge of General Education information will improve Description: Including pain rating scale, medication(s)/side effects and non-pharmacologic comfort measures Outcome: Progressing   Problem: Health Behavior/Discharge Planning: Goal: Ability to manage health-related needs will improve Outcome: Progressing   Problem: Clinical Measurements: Goal: Ability to maintain clinical measurements within normal limits will improve Outcome: Progressing Goal: Will remain free from infection Outcome: Progressing Goal: Diagnostic test results will improve Outcome: Progressing Goal: Respiratory complications will improve Outcome: Progressing Goal: Cardiovascular complication will be avoided Outcome: Progressing   Problem: Activity: Goal: Risk for activity intolerance will decrease Outcome: Progressing   Problem: Nutrition: Goal: Adequate nutrition will be maintained Outcome: Progressing   Problem: Coping: Goal: Level of anxiety will decrease Outcome: Progressing   Problem: Elimination: Goal: Will not experience complications related to bowel motility Outcome: Progressing Goal: Will not experience complications related to urinary retention Outcome: Progressing   Problem: Pain Management: Goal: General experience of comfort will improve Outcome: Progressing   Problem: Safety: Goal: Ability to remain free from injury will improve Outcome: Progressing   Problem: Skin Integrity: Goal: Risk for impaired skin integrity will decrease Outcome: Progressing   Problem: Fluid Volume: Goal: Hemodynamic stability will improve Outcome: Progressing   Problem: Clinical Measurements: Goal: Diagnostic test results will improve Outcome: Progressing Goal: Signs and symptoms of infection will decrease Outcome: Progressing   Problem: Respiratory: Goal: Ability to maintain adequate ventilation will improve Outcome: Progressing   Problem: Education: Goal:  Knowledge of General Education information will improve Description: Including pain rating scale, medication(s)/side effects and non-pharmacologic comfort measures Outcome: Progressing   Problem: Health Behavior/Discharge Planning: Goal: Ability to manage health-related needs will improve Outcome: Progressing   Problem: Clinical Measurements: Goal: Ability to maintain clinical measurements within normal limits will improve Outcome: Progressing Goal: Will remain free from infection Outcome: Progressing Goal: Diagnostic test results will improve Outcome: Progressing Goal: Respiratory complications will improve Outcome: Progressing Goal: Cardiovascular complication will be avoided Outcome: Progressing   Problem: Activity: Goal: Risk for activity intolerance will decrease Outcome: Progressing   Problem: Nutrition: Goal: Adequate nutrition will be maintained Outcome: Progressing   Problem: Coping: Goal: Level of anxiety will decrease Outcome: Progressing   Problem: Elimination: Goal: Will not experience complications related to bowel motility Outcome: Progressing Goal: Will not experience complications related to urinary retention Outcome: Progressing   Problem: Pain Management: Goal: General experience of comfort will improve Outcome: Progressing   Problem: Safety: Goal: Ability to remain free from injury will improve Outcome: Progressing   Problem: Skin Integrity: Goal: Risk for impaired skin integrity will decrease Outcome: Progressing

## 2023-12-10 ENCOUNTER — Other Ambulatory Visit: Payer: Self-pay | Admitting: Family Medicine

## 2023-12-10 DIAGNOSIS — Z7189 Other specified counseling: Secondary | ICD-10-CM | POA: Diagnosis not present

## 2023-12-10 DIAGNOSIS — Z515 Encounter for palliative care: Secondary | ICD-10-CM | POA: Diagnosis not present

## 2023-12-10 DIAGNOSIS — A419 Sepsis, unspecified organism: Secondary | ICD-10-CM | POA: Diagnosis not present

## 2023-12-10 DIAGNOSIS — R6521 Severe sepsis with septic shock: Secondary | ICD-10-CM | POA: Diagnosis not present

## 2023-12-10 LAB — COMPREHENSIVE METABOLIC PANEL
ALT: 90 U/L — ABNORMAL HIGH (ref 0–44)
AST: 72 U/L — ABNORMAL HIGH (ref 15–41)
Albumin: 1.7 g/dL — ABNORMAL LOW (ref 3.5–5.0)
Alkaline Phosphatase: 121 U/L (ref 38–126)
Anion gap: 8 (ref 5–15)
BUN: 35 mg/dL — ABNORMAL HIGH (ref 8–23)
CO2: 21 mmol/L — ABNORMAL LOW (ref 22–32)
Calcium: 8.9 mg/dL (ref 8.9–10.3)
Chloride: 104 mmol/L (ref 98–111)
Creatinine, Ser: 1.13 mg/dL (ref 0.61–1.24)
GFR, Estimated: >60 mL/min (ref >60)
Glucose, Bld: 134 mg/dL — ABNORMAL HIGH (ref 70–99)
Potassium: 4.2 mmol/L (ref 3.5–5.1)
Sodium: 133 mmol/L — ABNORMAL LOW (ref 135–145)
Total Bilirubin: 0.3 mg/dL (ref <1.2)
Total Protein: 5.2 g/dL — ABNORMAL LOW (ref 6.5–8.1)

## 2023-12-10 LAB — CBC
HCT: 21.2 % — ABNORMAL LOW (ref 39.0–52.0)
Hemoglobin: 6.7 g/dL — CL (ref 13.0–17.0)
MCH: 28.2 pg (ref 26.0–34.0)
MCHC: 31.6 g/dL (ref 30.0–36.0)
MCV: 89.1 fL (ref 80.0–100.0)
Platelets: 397 10*3/uL (ref 150–400)
RBC: 2.38 MIL/uL — ABNORMAL LOW (ref 4.22–5.81)
RDW: 17.3 % — ABNORMAL HIGH (ref 11.5–15.5)
WBC: 11.3 10*3/uL — ABNORMAL HIGH (ref 4.0–10.5)
nRBC: 0 % (ref 0.0–0.2)

## 2023-12-10 LAB — HEMOGLOBIN AND HEMATOCRIT, BLOOD
HCT: 26.1 % — ABNORMAL LOW (ref 39.0–52.0)
Hemoglobin: 8.2 g/dL — ABNORMAL LOW (ref 13.0–17.0)

## 2023-12-10 LAB — PREPARE RBC (CROSSMATCH)

## 2023-12-10 MED ORDER — ACETAMINOPHEN 325 MG PO TABS
650.0000 mg | ORAL_TABLET | Freq: Once | ORAL | Status: AC
  Administered 2023-12-10: 650 mg via ORAL
  Filled 2023-12-10: qty 2

## 2023-12-10 MED ORDER — SODIUM CHLORIDE 0.9% IV SOLUTION: Freq: Once | INTRAVENOUS | Status: AC

## 2023-12-10 NOTE — Plan of Care (Signed)
  Problem: Education: Goal: Knowledge of General Education information will improve Description: Including pain rating scale, medication(s)/side effects and non-pharmacologic comfort measures Outcome: Progressing   Problem: Health Behavior/Discharge Planning: Goal: Ability to manage health-related needs will improve Outcome: Progressing   Problem: Clinical Measurements: Goal: Ability to maintain clinical measurements within normal limits will improve Outcome: Progressing Goal: Will remain free from infection Outcome: Progressing Goal: Diagnostic test results will improve Outcome: Progressing Goal: Respiratory complications will improve Outcome: Progressing Goal: Cardiovascular complication will be avoided Outcome: Progressing   Problem: Activity: Goal: Risk for activity intolerance will decrease Outcome: Progressing   Problem: Nutrition: Goal: Adequate nutrition will be maintained Outcome: Progressing   Problem: Coping: Goal: Level of anxiety will decrease Outcome: Progressing   Problem: Elimination: Goal: Will not experience complications related to bowel motility Outcome: Progressing Goal: Will not experience complications related to urinary retention Outcome: Progressing   Problem: Pain Management: Goal: General experience of comfort will improve Outcome: Progressing   Problem: Safety: Goal: Ability to remain free from injury will improve Outcome: Progressing   Problem: Skin Integrity: Goal: Risk for impaired skin integrity will decrease Outcome: Progressing   Problem: Fluid Volume: Goal: Hemodynamic stability will improve Outcome: Progressing   Problem: Clinical Measurements: Goal: Diagnostic test results will improve Outcome: Progressing Goal: Signs and symptoms of infection will decrease Outcome: Progressing   Problem: Respiratory: Goal: Ability to maintain adequate ventilation will improve Outcome: Progressing   Problem: Education: Goal:  Knowledge of General Education information will improve Description: Including pain rating scale, medication(s)/side effects and non-pharmacologic comfort measures Outcome: Progressing   Problem: Health Behavior/Discharge Planning: Goal: Ability to manage health-related needs will improve Outcome: Progressing   Problem: Clinical Measurements: Goal: Ability to maintain clinical measurements within normal limits will improve Outcome: Progressing Goal: Will remain free from infection Outcome: Progressing Goal: Diagnostic test results will improve Outcome: Progressing Goal: Respiratory complications will improve Outcome: Progressing Goal: Cardiovascular complication will be avoided Outcome: Progressing   Problem: Activity: Goal: Risk for activity intolerance will decrease Outcome: Progressing   Problem: Nutrition: Goal: Adequate nutrition will be maintained Outcome: Progressing   Problem: Coping: Goal: Level of anxiety will decrease Outcome: Progressing   Problem: Elimination: Goal: Will not experience complications related to bowel motility Outcome: Progressing Goal: Will not experience complications related to urinary retention Outcome: Progressing   Problem: Pain Management: Goal: General experience of comfort will improve Outcome: Progressing   Problem: Safety: Goal: Ability to remain free from injury will improve Outcome: Progressing   Problem: Skin Integrity: Goal: Risk for impaired skin integrity will decrease Outcome: Progressing

## 2023-12-10 NOTE — Progress Notes (Signed)
   Palliative Medicine Inpatient Follow Up Note  HPI: [name] is a 72 y.o. male with a history of hypertension, hyperlipidemia, PAD status post right AKA, CKD stage IIIa, GERD, prostate cancer status post prostatectomy. Patient presented secondary to being found down on the ground and altered and was found to have evidence of hypotension concerning for septic shock with additional evidence of rhabdomyolysis.    Palliative care has been asked to get involved for further goals of care conversations.   Today's Discussion 12/10/2023  *Please note that this is a verbal dictation therefore any spelling or grammatical errors are due to the "[employer] One" system interpretation.  Chart reviewed inclusive of vital signs, progress notes, laboratory results, and diagnostic images.   I met with [name] this morning, he was noted to be finishing up his breakfast. He shares that he is feeling well overall. He expresses readiness to transition to the facility for him to gain strength.   [name] and I discussed outpatient Palliative support. He feels it will be to difficult to travel to [place] to go to the clinic. He is agreeable to outpatient palliative support coming to see him.  Plan for [name] to follow up as an outpatient with his oncologist to determine treatment(s) moving forward.   Questions and concerns addressed/Palliative Support Provided.   Objective Assessment: Vital Signs Vitals:   12/10/23 0741 12/10/23 1045  BP: (!) 105/47 (!) 95/53  Pulse: (!) 109 96  Resp: 18 16  Temp: 97.8 F (36.6 C) 97.6 F (36.4 C)  SpO2: 94% 98%    Intake/Output Summary (Last 24 hours) at 12/10/2023 1100 Last data filed at 12/09/2023 2250 Gross per 24 hour  Intake 240 ml  Output --  Net 240 ml   Last Weight  Most recent update: 12/10/2023  6:51 AM    Weight  93.9 kg (207 lb 0.2 oz)            Gen: Elderly African-American male in no acute distress HEENT: moist mucous  membranes CV: Regular rate and rhythm  PULM: On room air breathing is even and nonlabored ABD: soft/nontender  EXT: Right AKA Neuro: Alert and oriented x3  SUMMARY OF RECOMMENDATIONS   DNAR/DNI   Patient's goals are for rehabilitation and to see if his present cancer is treatable   TOC --> OP Palliative support. Patient not able to travel to [facility] symptom management clinic, have alerted [name]   Ongoing peripheral palliative care support  Time: 25 ______________________________________________________________________________________ [name] [place] Palliative Medicine Team Team Cell Phone: [phone] Please utilize secure chat with additional questions, if there is no response within 30 minutes please call the above phone number  Palliative Medicine Team providers are available by phone from 7am to 7pm daily and can be reached through the team cell phone.  Should this patient require assistance outside of these hours, please call the patient's attending physician.

## 2023-12-10 NOTE — Discharge Summary (Signed)
Physician Discharge Summary  MILAS SHONG SWN:[id number] DOB: 1951/08/22 DOA: 11/26/2023  PCP: Rica Records, FNP  Admit date: 11/26/2023 Discharge date: 12/11/2023 Recommendations for Outpatient Follow-up:  Follow up with PCP in 1 weeks-call for appointment Follow-up with Dr. Mosetta Putt to discuss treatment option  please obtain BMP/CBC in one week  Discharge Dispo: SNF Discharge Condition: Stable Code Status:   Code Status: Limited: Do not attempt resuscitation (DNR) -DNR-LIMITED -Do Not Intubate/DNI  Diet recommendation:  Diet Order             Diet - low sodium heart healthy           Diet regular Room service appropriate? Yes; Fluid consistency: Thin  Diet effective now                   Brief/Interim Summary: DURELLE HOLMS is a 72 y.o. male with a history of hypertension, hyperlipidemia, PAD status post right AKA, CKD stage IIIa, GERD, prostate cancer status post prostatectomy.  Patient presented secondary to being found down on the ground and altered and was found to have evidence of hypotension concerning for septic shock with additional evidence of rhabdomyolysis.  Patient started empirically on antibiotics, started on vasopressor support, and IV fluids and was transferred to [facility] from [facility] for ICU admission.  Antibiotics were discontinued secondary to unlikely infectious etiology.  Hypotension and rhabdomyolysis improved with IV fluids and vasopressor support was discontinued.  Admission complicated by the identification of metastatic disease of unknown primary. Calcium worsening s/p Zometa.  Following Zometa calcium has significantly improved, patient had downtrending of hemoglobin-will need PRBC transfusion 12/13 and hemoglobin overall stable 7.4-8 gm.  Overall prognosis does not appear bright, he is waiting for placement.  Hemoglobin again was less than 7 g additional 1 unit PRBC ordered 12/16 with plan for discharge 12/17.   Posttransfusion hemoglobin improved and medically stable for discharg.  Discharge Diagnoses:  Principal Problem:   Sepsis (HCC) Active Problems:   Normocytic anemia   Hypovolemic shock (HCC)   Rhabdomyolysis   Pressure injury of skin   AKI (acute kidney injury) (HCC)   DVT (deep venous thrombosis) (HCC)   GERD (gastroesophageal reflux disease)   Transaminitis   Malnutrition of moderate degree   Metastatic adenocarcinoma (HCC)  Hypercalcemia of malignancy: W/ ?intermittent confusion but currently mental status improved and stable. S/P zometa on 12/04/23 Recent Labs  Lab 12/06/23 0614 12/07/23 0716 12/08/23 0629 12/09/23 0756 12/10/23 0621  CALCIUM 10.4* 9.9 9.4 9.2 8.9    Hypophosphatemia:Resolved   Poorly differentiated adenocarcinoma of unknown primary History of prostate cancer-f/b Dr Ellin Saba: Soft tissue mass anterior abdominal wall biopsy positive for malignancy as above.  Oncology following. S/p  CT C-spine head/A/P and maxillofacial done on 12/2- shows large lytic lesion T1 likely mets with likely epidural spread, multiple enlarged cervical lymph nodes and neck by adrenal masses multiple bone mets. CT chest 12/3 right hilar mass adenopathy right adrenal mets. Underwent GI eval with EGD 12/7 negative for upper GI malignancy. Seen by Dr. Leigh Aurora on further ID/[employer] sequencing [employer] to see if he is a candidate for targeted therapy or immunotherapy- and will be decided on outpatient basis.  Given overall poor/guarded prognosis palliative care has seen the patient and now patient is DNR/DNI and will fu w/ OP PMT  Acute DVT right upper extremity: Age indeterminate left lower extremity DVT/Chronic right lower extremity DVT Continue Eliquis. Monitor hemoglobin.  Normocytic anemia Anemia of malignancy: Multifactorial due  to malignancy, infections,CKD.  S/p1 unit PRBC 12/13 and on 12/16. -Posttransfusion hemoglobin improved to 8.2 g.  Check CBC as outpatient  follow-up with oncology.He had EGD on 12/7. Recent Labs  Lab 12/07/23 1858 12/08/23 0629 12/09/23 0756 12/10/23 0621 12/10/23 1459  HGB 8.2* 7.4* 7.1* 6.7* 8.2*  HCT 25.7* 23.2* 22.0* 21.2* 26.1*    Hypovolemic shock Essential hypertension Sepsis ruled out. Patient managed in ICU with norepinephrine and fluid resuscitation.  Stable bp at times soft, antihypertensive discontinued  Traumatic rhabdomyolysis: Secondary to being down. CK of 6,948 on admission.Last CK5 as below.Associated elevated AST/ALT. Improvement with IV fluids.    AKI: Creatinine peaked to 2.2 likely AKI now improved   GERD Continue Protonix.  Hypokalemia hyponatremia Sodium slightly low stable, encourage oral intake    Hyperlipidemia Holding Lipitor> resume upon discharge   PAD right AKA: Cont asa    Scrotal cellulitis/candidiasis MRI without deep infection, abscess, or features necrotizing disease. Infection appears to be improving. Blood cultures (12/6) are no growth to date.  Antibiotics completed 12/06/23.   Sepsis: Unclear if present on admission, but recurrent criteria met during hospitalization secondary to scrotal infection. Blood cultures obtained. Empiric vancomycin and Cefepime started for treatment. Blood cultures (12/6) are no growth to date.   Phantom limb pain Noted.   Foley catheter in place: Placed secondary to scrotal wound and significant edema.  Removed   Moderate malnutrition Continue to augment nutritional status, RD following Nutrition Problem: Moderate Malnutrition Etiology: acute illness Signs/Symptoms: estimated needs, moderate fat depletion, moderate muscle depletion Interventions: Ensure Enlive (each supplement provides 350kcal and 20 grams of protein), MVI, Liberalize Diet  Goals of care: Changed to DNR 12/14, overall prognosis poor  He will follow-up with outpatient palliative care and hematology oncology   Pressure injury.  Pretibial left stage II POA wound care as  below Foley catheter in place to prevent moisture- will do TOV Sunday 12/15 Pressure Injury 11/26/23 Pretibial Distal;Left;Medial Stage 2 -  Partial thickness loss of dermis presenting as a shallow open injury with a red, pink wound bed without slough. Edematous excoriated area (Active)  11/26/23 1845  Location: Pretibial  Location Orientation: Distal;Left;Medial  Staging: Stage 2 -  Partial thickness loss of dermis presenting as a shallow open injury with a red, pink wound bed without slough.  Wound Description (Comments): Edematous excoriated area  Present on Admission: Yes  Dressing Type Foam - Lift dressing to assess site every shift 12/04/23 1400    Consults: Palliative care,  oncology Subjective: Alert awake oriented eager to go to rehab today Overnight afebrile BP stable repeat H&H 8.2 g following 1 unit PRBC.  Discharge Exam: Vitals:   12/11/23 1044 12/11/23 1104  BP: (!) 93/50 (!) 111/51  Pulse: (!) 118   Resp: 18   Temp: 97.7 F (36.5 C)   SpO2: 97%    General: Pt is alert, awake, not in acute distress Cardiovascular: RRR, S1/S2 +, no rubs, no gallops Respiratory: CTA bilaterally, no wheezing, no rhonchi Abdominal: Soft, NT, ND, bowel sounds + Extremities: no edema, no cyanosis  Discharge Instructions  Discharge Instructions     Diet - low sodium heart healthy   Complete by: As directed    Discharge instructions   Complete by: As directed    Please call call MD or return to ER for similar or worsening recurring problem that brought you to hospital or if any fever,nausea/vomiting,abdominal pain, uncontrolled pain, chest pain,  shortness of breath or any other alarming symptoms.  Please follow-up your doctor as instructed in a week time and call the office for appointment.  Please avoid alcohol, smoking, or any other illicit substance and maintain healthy habits including taking your regular medications as prescribed.  You were cared for by a hospitalist during  your hospital stay. If you have any questions about your discharge medications or the care you received while you were in the hospital after you are discharged, you can call the unit and ask to speak with the hospitalist on call if the hospitalist that took care of you is not available.  Once you are discharged, your primary care physician will handle any further medical issues. Please note that NO REFILLS for any discharge medications will be authorized once you are discharged, as it is imperative that you return to your primary care physician (or establish a relationship with a primary care physician if you do not have one) for your aftercare needs so that they can reassess your need for medications and monitor your lab values   Discharge wound care:   Complete by: As directed    Left leg and inner gluteo: Apply foam dressing, change every 3 days or if is saturated. MASD groin: apply Gerhadt cream BID or when cleaning. Apply anti fungal powder after.   Increase activity slowly   Complete by: As directed       Allergies as of 12/11/2023       Reactions   Shellfish Allergy Swelling        Medication List     STOP taking these medications    lisinopril-hydrochlorothiazide 10-12.5 MG tablet Commonly known as: ZESTORETIC   metoprolol tartrate 25 MG tablet Commonly known as: LOPRESSOR       TAKE these medications    acetaminophen 325 MG tablet Commonly known as: TYLENOL Take 1-2 tablets (325-650 mg total) by mouth every 4 (four) hours as needed for mild pain.   albuterol 108 (90 Base) MCG/ACT inhaler Commonly known as: VENTOLIN HFA Inhale 2 puffs into the lungs every 6 (six) hours as needed for wheezing or shortness of breath.   apixaban 5 MG Tabs tablet Commonly known as: ELIQUIS Take 1 tablet (5 mg total) by mouth 2 (two) times daily.   atorvastatin 40 MG tablet Commonly known as: LIPITOR Take 1 tablet (40 mg total) by mouth every morning.   benzonatate 200 MG  capsule Commonly known as: TESSALON Take 1 capsule (200 mg total) by mouth 2 (two) times daily as needed for cough.   CENTRUM SILVER PO Take 1 tablet by mouth daily.   cyclobenzaprine 5 MG tablet Commonly known as: FLEXERIL Take 1 tablet (5 mg total) by mouth 3 (three) times daily as needed for muscle spasms.   feeding supplement Liqd Take 237 mLs by mouth at bedtime.   gabapentin 100 MG capsule Commonly known as: Neurontin Take 2 capsules (200 mg total) by mouth 2 (two) times daily. Along with 300 mg nightly- for phantom pain- due to R AKA What changed: Another medication with the same name was removed. Continue taking this medication, and follow the directions you see here.   iron polysaccharides 150 MG capsule Commonly known as: NIFEREX Take 1 capsule (150 mg total) by mouth daily.   levocetirizine 5 MG tablet Commonly known as: XYZAL Take 1 tablet (5 mg total) by mouth every evening.   melatonin 3 MG Tabs tablet Take 1 tablet (3 mg total) by mouth at bedtime as needed.   oxybutynin 10 MG 24 hr  tablet Commonly known as: DITROPAN-XL Take 10 mg by mouth daily.   oxyCODONE 5 MG immediate release tablet Commonly known as: Oxy IR/ROXICODONE Take 1 tablet (5 mg total) by mouth every 6 (six) hours as needed for up to 4 doses for severe pain (pain score 7-10). What changed:  medication strength how much to take when to take this reasons to take this   pantoprazole 40 MG tablet Commonly known as: PROTONIX Take 1 tablet (40 mg total) by mouth daily.   polyethylene glycol powder 17 GM/SCOOP powder Commonly known as: MiraLax Take 17 g by mouth daily as needed for mild constipation.   senna-docusate 8.6-50 MG tablet Commonly known as: Senokot-S Take 2 tablets by mouth daily after supper.   sodium bicarbonate 650 MG tablet Take 1 tablet (650 mg total) by mouth 2 (two) times daily.               Discharge Care Instructions  (From admission, onward)            Start     Ordered   12/11/23 0000  Discharge wound care:       Comments: Left leg and inner gluteo: Apply foam dressing, change every 3 days or if is saturated. MASD groin: apply Gerhadt cream BID or when cleaning. Apply anti fungal powder after.   12/11/23 0950            Contact information for after-discharge care     Destination     [facility] .   Service: Skilled Nursing Contact information: [address] [place] [address] [phone]                    Allergies  Allergen Reactions   Shellfish Allergy Swelling    The results of significant diagnostics from this hospitalization (including imaging, microbiology, ancillary and laboratory) are listed below for reference.    Microbiology: No results found for this or any previous visit (from the past 240 hours).  Procedures/Studies: MR PELVIS W WO CONTRAST Result Date: 11/30/2023 CLINICAL DATA:  Soft tissue infection suspected evaluate for abscess and gas formation underneath the skin around the groin, penile, and scrotal area, clinical concern for cellulitis and candidiasis. Metastatic malignancy with soft tissue metastases in the pelvis. EXAM: MRI PELVIS WITHOUT AND WITH CONTRAST TECHNIQUE: Multiplanar multisequence MR imaging of the pelvis was performed both before and after administration of intravenous contrast. CONTRAST:  9mL GADAVIST GADOBUTROL 1 MMOL/ML IV SOLN COMPARISON:  CT abdomen pelvis, 11/26/2023 FINDINGS: Urinary Tract:  No abnormality visualized. Bowel:  Unremarkable visualized pelvic bowel loops. Vascular/Lymphatic: Multiple enlarged bilateral inguinal lymph nodes left inguinal nodes measuring up to 2.0 x 1.3 cm (series 13, image 15). No significant vascular abnormality seen. Reproductive: Status post prostatectomy. Soft tissue edema of the scrotum and penis. No focal fluid collection. Other: Diffuse anasarca. Multiple enhancing subcutaneous soft tissue nodules, including  within the pubic subcutaneous fat measuring 3.3 x 2.8 cm (series 13, image 18), in the subcutaneous fat of the anterior left thigh measuring 2.1 x 1.9 cm (series 13, image 8), overlying the left hamstring musculature measuring 2.6 x 2.0 cm (series 13, image 56), and in the right biceps femoris muscle body measuring 2.6 x 2.5 cm (series 13, image 66). Musculoskeletal: Multiple small enhancing osseous metastases, including of the greater trochanter of the left femur (series 13, image 6), and of the left ischial ramus (series 13, image 21). IMPRESSION: 1. Soft tissue edema of the scrotum and  penis, consistent with cellulitis. No focal fluid collection or gas to suggest abscess or necrotizing fasciitis. 2. Multiple enhancing subcutaneous and intramuscular metastatic soft tissue nodules throughout the pelvis and included proximal femurs. 3. Multiple enlarged bilateral inguinal lymph nodes, possibly reactive although highly suspicious for nodal metastases. 4. Multiple small enhancing osseous metastases. 5. Status post prostatectomy. Electronically Signed   By: Jearld Lesch M.D.   On: 11/30/2023 15:32   VAS [place] UPPER EXTREMITY VENOUS DUPLEX Result Date: 11/30/2023 UPPER VENOUS STUDY  Patient Name:  EASTAN KALUZA  Date of Exam:   11/30/2023 Medical Rec #: [id number]            Accession #:    [id number] Date of Birth: 13-Oct-1951            Patient Gender: M Patient Age:   31 years Exam Location:  [facility] Procedure:      VAS [place] UPPER EXTREMITY VENOUS DUPLEX Referring Phys: RALPH NETTEY --------------------------------------------------------------------------------  Indications: Swelling Other Indications: Testing upon admission revealed multiple enlarged cervical lymph nodes predominantly on the right side. Adenopathy also envolving right axillary and supraclavicular areas. Soft tissue lesions seen on CT and MRI. Anticoagulation: Heparin. Limitations: Poor ultrasound/tissue interface and line. Comparison  Study: No previous UEV exams Performing Technologist: Jody Hill RVT, RDMS  Examination Guidelines: A complete evaluation includes B-mode imaging, spectral Doppler, color Doppler, and power Doppler as needed of all accessible portions of each vessel. Bilateral testing is considered an integral part of a complete examination. Limited examinations for reoccurring indications may be performed as noted.  Right Findings: +----------+------------+---------+-----------+----------+---------------------+ RIGHT     CompressiblePhasicitySpontaneousProperties       Summary        +----------+------------+---------+-----------+----------+---------------------+ IJV           Full       Yes       Yes                                  +----------+------------+---------+-----------+----------+---------------------+ Subclavian    None       No        No                Acute - mid and                                                               distal         +----------+------------+---------+-----------+----------+---------------------+ Axillary      None       No        No                       Acute         +----------+------------+---------+-----------+----------+---------------------+ Brachial    Partial                                 Acute proximal - one  of paired       +----------+------------+---------+-----------+----------+---------------------+ Radial        Full                                                        +----------+------------+---------+-----------+----------+---------------------+ Ulnar         Full                                                        +----------+------------+---------+-----------+----------+---------------------+ Cephalic      None       No        No                       Acute          +----------+------------+---------+-----------+----------+---------------------+ Basilic       None       No        No                       Acute         +----------+------------+---------+-----------+----------+---------------------+   Extrinsic compression of jugular and proximal subclavian veins from several solid appearing masses of neck and clavicle area. Cannot differentiate thyroid from myph nodes / masses. Subcutaneous fluid of right neck.  Left Findings: +----------+------------+---------+-----------+----------+------------------+ LEFT      CompressiblePhasicitySpontaneousProperties     Summary       +----------+------------+---------+-----------+----------+------------------+ IJV           Full       Yes       Yes                                 +----------+------------+---------+-----------+----------+------------------+ Subclavian    Full       Yes       Yes                                 +----------+------------+---------+-----------+----------+------------------+ Axillary      Full       Yes       Yes                                 +----------+------------+---------+-----------+----------+------------------+ Brachial      Full       Yes       Yes                                 +----------+------------+---------+-----------+----------+------------------+ Radial        Full                                                     +----------+------------+---------+-----------+----------+------------------+ Ulnar  unable to insonate +----------+------------+---------+-----------+----------+------------------+ Cephalic      None       No        No                     Acute        +----------+------------+---------+-----------+----------+------------------+ Basilic       Full       Yes       Yes                                  +----------+------------+---------+-----------+----------+------------------+ hypoechoic heterogenous superficial solid appearing structure seen proximally to axillary region (3.0 x 1.6 x 2.5 cm) & in mid upper arm (1.9 x 1.1 x 1.5 cm). hypoechoic solid appearing intramuscular structure seen in mid upper arm (2.0 x 0.9 x 1.1 cm).  Summary:  Right: Findings consistent with acute deep vein thrombosis involving the right subclavian vein, right axillary vein and right brachial veins. Findings consistent with acute superficial vein thrombosis involving the right basilic vein and right cephalic vein.  Left: Findings consistent with acute superficial vein thrombosis involving the left cephalic vein. However, unable to visualize the ulnar veins.  *See table(s) above for measurements and observations.  Diagnosing physician: Carolynn Sayers Electronically signed by Carolynn Sayers on 11/30/2023 at 2:15:21 PM.    Final    IR US Guide Bx Asp/Drain Result Date: 11/28/2023 INDICATION: 72 year old male referred for biopsy. He has suspicion of lung carcinoma EXAM: ULTRASOUND-GUIDED BIOPSY OF LEFT LOWER QUADRANT ABDOMINAL WALL MASS. MEDICATIONS: None. ANESTHESIA/SEDATION: None COMPLICATIONS: None PROCEDURE: Informed written consent was obtained from the patient after a thorough discussion of the procedural risks, benefits and alternatives. All questions were addressed. Maximal Sterile Barrier Technique was utilized including caps, mask, sterile gowns, sterile gloves, sterile drape, hand hygiene and skin antiseptic. A timeout was performed prior to the initiation of the procedure. The procedure, risks, benefits, and alternatives were explained to the patient. Questions regarding the procedure were encouraged and answered. The patient understands and consents to the procedure. Ultrasound survey was performed with images stored and sent to PACs. The left anterior abdominal wall was prepped with chlorhexidine in a sterile fashion, and a  sterile drape was applied covering the operative field. A sterile gown and sterile gloves were used for the procedure. Local anesthesia was provided with 1% Lidocaine. Ultrasound guidance was used to infiltrate the region with 1% lidocaine for local anesthesia. Multiple 18 gauge core biopsy were then acquired of the soft tissue nodule using ultrasound guidance. Images were stored. Final image was stored after biopsy. Patient tolerated the procedure well and remained hemodynamically stable throughout. No complications were encountered and no significant blood loss was encounter IMPRESSION: Status post ultrasound-guided anterior abdominal wall mass biopsy Signed, Yvone Neu. Miachel Roux, RPVI [facility] Radiology Electronically Signed   By: Gilmer Mor D.O.   On: 11/28/2023 13:54   MR THORACIC SPINE W WO CONTRAST Result Date: 11/28/2023 CLINICAL DATA:  Metastatic disease evaluation EXAM: MRI THORACIC WITHOUT AND WITH CONTRAST TECHNIQUE: Multiplanar and multiecho pulse sequences of the thoracic spine were obtained without and with intravenous contrast. CONTRAST:  9mL GADAVIST GADOBUTROL 1 MMOL/ML IV SOLN COMPARISON:  None Available. FINDINGS: Alignment:  Physiologic. Vertebrae: Soft tissue mass replacing the T1 spinous process. Metastatic lesions of the vertebral bodies at T5, T6, T10 and L1. The T10 lesion extends into the right articular  pillar. There is a pathologic fracture at the posterior aspect of the T10 vertebral body with approximately 25% height loss. Cord:  Normal signal and morphology. Paraspinal and other soft tissues: There are nodular soft tissue lesions superior to both scapulae. There is an additional soft tissue nodule within the posterior subcutaneous fat at the L1 level. Disc levels: Axial images are markedly motion degraded. There is severe spinal canal stenosis at the T1 level secondary to dorsal mass effect from the soft tissue mass  replacing the spinous process. There is mild spinal canal stenosis at the T10 level due to posterior bulging of the vertebral body lesion. IMPRESSION: 1. Soft tissue mass replacing the T1 spinous process causing severe spinal canal stenosis. 2. Metastatic lesions of the vertebral bodies at T5, T6, T10 and L1. 3. Pathologic fracture at the posterior aspect of the T10 vertebral body with approximately 25% height loss. 4. Nodular soft tissue lesions superior to both scapulae and within the posterior subcutaneous fat at the L1 level, consistent with metastatic disease. Electronically Signed   By: Deatra Robinson M.D.   On: 11/28/2023 02:56   CT CHEST WO CONTRAST Result Date: 11/27/2023 CLINICAL DATA:  Concern for lung cancer and metastatic disease. Interstitial lung disease. EXAM: CT CHEST WITHOUT CONTRAST TECHNIQUE: Multidetector CT imaging of the chest was performed following the standard protocol without IV contrast. RADIATION DOSE REDUCTION: This exam was performed according to the departmental dose-optimization program which includes automated exposure control, adjustment of the mA and/or kV according to patient size and/or use of iterative reconstruction technique. COMPARISON:  Chest CT dated 11/12/2023. FINDINGS: Evaluation of this exam is limited in the absence of intravenous contrast. Cardiovascular: Top-normal cardiac size. No pericardial effusion. Three-vessel coronary vascular calcification. Moderate atherosclerotic calcification of the thoracic aorta. No aneurysmal dilatation. The central pulmonary arteries are grossly unremarkable. Mediastinum/Nodes: Subcarinal adenopathy measuring 25 mm short axis. Right hilar mass/adenopathy measures 29 mm in short axis. Mediastinal adenopathy anterior to the carina measures 15 mm short axis. Additional upper mediastinal and right supraclavicular adenopathy as seen on the prior CT. No mediastinal fluid collection. Lungs/Pleura: Debris is noted in the bronchus  intermedius extending into the right lower lobe bronchus. There is consolidative changes of the majority of the right lower lobe consistent with post obstructive atelectasis or infiltrate. There is partial consolidation of the left lower lobe. No pleural effusion or pneumothorax. Upper Abdomen: Right adrenal thickening consistent with metastasis. Musculoskeletal: Scattered osseous lytic metastasis involving the T5, T6, and T10 with associated pathologic fracture as seen previously. Extension of the metastasis at T10 to the central spinal with focal narrowing of the central canal. Additional lytic metastasis involving bilateral ribs. The largest lesion measures approximately 4.8 x 2.5 cm involving the anterior right fifth rib with associated destruction of the rib. Bilateral axillary adenopathy measure up to 17 mm in short axis on the left. IMPRESSION: 1. Right hilar mass/adenopathy as well as mediastinal, axillary, and right supraclavicular adenopathy. 2. Debris in the bronchus intermedius and right lower lobe bronchus with post obstructive atelectasis or infiltrate of the majority of the right lower lobe. 3. Partial consolidation of the left lower lobe. 4. Right adrenal metastasis. 5. Osseous lytic metastasis with associated pathologic fractures as above. 6.  Aortic Atherosclerosis (ICD10-I70.0). Electronically Signed   By: Elgie Collard M.D.   On: 11/27/2023 19:26   VAS [place] LOWER EXTREMITY VENOUS (DVT) Result Date: 11/27/2023  Lower Venous DVT Study Patient Name:  TUSHAR DENNEE  Date of Exam:  11/27/2023 Medical Rec #: [id number]            Accession #:    [id number] Date of Birth: 05-27-1951            Patient Gender: M Patient Age:   46 years Exam Location:  [facility] Procedure:      VAS [place] LOWER EXTREMITY VENOUS (DVT) Referring Phys: JESSICA MARSHALL --------------------------------------------------------------------------------  Indications: Swelling, and Recent fall. 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  Risk Factors: Surgery History of right common femoral to posterior tibial artery bypass on 12/12/22. Comparison Study: No priors. Performing Technologist: Marilynne Halsted RDMS, RVT  Examination Guidelines: A complete evaluation includes B-mode imaging, spectral Doppler, color Doppler, and power Doppler as needed of all accessible portions of each vessel. Bilateral testing is considered an integral part of a complete examination. Limited examinations for reoccurring indications may be performed as noted. The reflux portion of the exam is performed with the patient in reverse Trendelenburg.  +---------+---------------+---------+-----------+----------+-------------------+ RIGHT    CompressibilityPhasicitySpontaneityPropertiesThrombus Aging      +---------+---------------+---------+-----------+----------+-------------------+ CFV      Partial        Yes      Yes                  Chronic             +---------+---------------+---------+-----------+----------+-------------------+ FV Prox  Partial                                      Chronic             +---------+---------------+---------+-----------+----------+-------------------+ FV Mid   Partial                                      Chronic             +---------+---------------+---------+-----------+----------+-------------------+ FV Distal                                             Not well visualized +---------+---------------+---------+-----------+----------+-------------------+ PFV      Partial                                                          +---------+---------------+---------+-----------+----------+-------------------+ POP                                                   AKA                 +---------+---------------+---------+-----------+----------+-------------------+ Occluded bypass graft.  +---------+---------------+---------+-----------+----------+-----------------+ LEFT      CompressibilityPhasicitySpontaneityPropertiesThrombus Aging    +---------+---------------+---------+-----------+----------+-----------------+ CFV      Full           Yes      Yes                                    +---------+---------------+---------+-----------+----------+-----------------+  SFJ      Full                                                           +---------+---------------+---------+-----------+----------+-----------------+ FV Prox  Full                                                           +---------+---------------+---------+-----------+----------+-----------------+ FV Mid   Full                                                           +---------+---------------+---------+-----------+----------+-----------------+ FV DistalPartial        Yes      Yes                  Age Indeterminate +---------+---------------+---------+-----------+----------+-----------------+ PFV      Full                                                           +---------+---------------+---------+-----------+----------+-----------------+ POP      Partial        No       No                   Age Indeterminate +---------+---------------+---------+-----------+----------+-----------------+ PTV      None                                         Age Indeterminate +---------+---------------+---------+-----------+----------+-----------------+ PERO     None                                         Age Indeterminate +---------+---------------+---------+-----------+----------+-----------------+     Summary: RIGHT: - Findings consistent with chronic deep vein thrombosis involving the right femoral vein.  LEFT: - Findings consistent with age indeterminate deep vein thrombosis involving the left femoral vein, left popliteal vein, left posterior tibial veins, and left peroneal veins.   *See table(s) above for measurements and observations. Electronically signed by  Lemar Livings MD on 11/27/2023 at 5:15:26 PM.    Final    ECHOCARDIOGRAM COMPLETE Result Date: 11/27/2023    ECHOCARDIOGRAM REPORT   Patient Name:   TYMIR ONEAL Date of Exam: 11/27/2023 Medical Rec #:  [id number]           Height:       70.0 in Accession #:    [id number]          Weight:       201.5 lb Date of Birth:  04/23/1951           BSA:  2.094 m Patient Age:    72 years            BP:           105/57 mmHg Patient Gender: M                   HR:           90 bpm. Exam Location:  Inpatient Procedure: 2D Echo, Cardiac Doppler and Color Doppler Indications:    Shock R57.9  History:        Patient has no prior history of Echocardiogram examinations. CKD                 and PAD; Risk Factors:Hypertension and Former Smoker.  Sonographer:    Dondra Prader RVT RCS Referring Phys: JESSICA MARSHALL  Sonographer Comments: Image acquisition challenging due to uncooperative patient and Image acquisition challenging due to respiratory motion. Patient unable to reposition; Echo done with patient supine. Unable to do breath holds. IMPRESSIONS  1. Left ventricular ejection fraction, by estimation, is 60 to 65%. The left ventricle has normal function. The left ventricle has no regional wall motion abnormalities. There is mild left ventricular hypertrophy. Left ventricular diastolic parameters were normal.  2. Right ventricular systolic function is hyperdynamic. The right ventricular size is normal. Tricuspid regurgitation signal is inadequate for assessing PA pressure.  3. The mitral valve is normal in structure. No evidence of mitral valve regurgitation. No evidence of mitral stenosis.  4. Abnormal echodensity seen in the right atrium only in subcostal view. This is superior to the tricuspid valve annulus. Cannot exclude normal variant anatomy, only seen in clip 65. Not seen in recent CTPE for comparison. The tricuspid valve is abnormal.  5. The aortic valve is tricuspid. Aortic valve regurgitation is mild to  moderate. Aortic valve sclerosis is present, with no evidence of aortic valve stenosis.  6. The inferior vena cava is dilated in size with <50% respiratory variability, suggesting right atrial pressure of 15 mmHg. Comparison(s): No prior Echocardiogram. FINDINGS  Left Ventricle: Left ventricular ejection fraction, by estimation, is 60 to 65%. The left ventricle has normal function. The left ventricle has no regional wall motion abnormalities. The left ventricular internal cavity size was normal in size. There is  mild left ventricular hypertrophy. Left ventricular diastolic parameters were normal. Right Ventricle: The right ventricular size is normal. No increase in right ventricular wall thickness. Right ventricular systolic function is hyperdynamic. Tricuspid regurgitation signal is inadequate for assessing PA pressure. Left Atrium: Left atrial size was normal in size. Right Atrium: Right atrial size was normal in size. Pericardium: There is no evidence of pericardial effusion. Mitral Valve: The mitral valve is normal in structure. No evidence of mitral valve regurgitation. No evidence of mitral valve stenosis. Tricuspid Valve: Abnormal echodensity seen in the right atrium only in subcostal view. This is superior to the tricuspid valve annulus. Cannot exclude normal variant anatomy, only seen in clip 65. Not seen in recent CTPE for comparison. The tricuspid valve is abnormal. Tricuspid valve regurgitation is not demonstrated. No evidence of tricuspid stenosis. Aortic Valve: The aortic valve is tricuspid. There is mild aortic valve annular calcification. Aortic valve regurgitation is mild to moderate. Aortic regurgitation PHT measures 360 msec. Aortic valve sclerosis is present, with no evidence of aortic valve  stenosis. Aortic valve mean gradient measures 5.0 mmHg. Aortic valve peak gradient measures 9.1 mmHg. Aortic valve area, by VTI measures 2.41 cm. Pulmonic Valve: The pulmonic valve was  normal in structure.  Pulmonic valve regurgitation is trivial. No evidence of pulmonic stenosis. Aorta: The aortic root and ascending aorta are structurally normal, with no evidence of dilitation. Venous: The inferior vena cava is dilated in size with less than 50% respiratory variability, suggesting right atrial pressure of 15 mmHg. IAS/Shunts: No atrial level shunt detected by color flow Doppler.  LEFT VENTRICLE PLAX 2D LVIDd:         4.20 cm   Diastology LVIDs:         2.60 cm   LV e' medial:    8.70 cm/s LV PW:         1.10 cm   LV E/e' medial:  9.9 LV IVS:        1.20 cm   LV e' lateral:   10.10 cm/s LVOT diam:     2.00 cm   LV E/e' lateral: 8.5 LV SV:         63 LV SV Index:   30 LVOT Area:     3.14 cm  RIGHT VENTRICLE             IVC RV S prime:     12.20 cm/s  IVC diam: 2.15 cm TAPSE (M-mode): 2.8 cm LEFT ATRIUM             Index        RIGHT ATRIUM           Index LA diam:        2.90 cm 1.38 cm/m   RA Area:     11.80 cm LA Vol (A2C):   30.1 ml 14.37 ml/m  RA Volume:   27.10 ml  12.94 ml/m LA Vol (A4C):   20.0 ml 9.55 ml/m LA Biplane Vol: 27.0 ml 12.89 ml/m  AORTIC VALVE                     PULMONIC VALVE AV Area (Vmax):    2.33 cm      PV Vmax:       1.04 m/s AV Area (Vmean):   2.38 cm      PV Peak grad:  4.3 mmHg AV Area (VTI):     2.41 cm AV Vmax:           151.00 cm/s AV Vmean:          100.000 cm/s AV VTI:            0.261 m AV Peak Grad:      9.1 mmHg AV Mean Grad:      5.0 mmHg LVOT Vmax:         112.00 cm/s LVOT Vmean:        75.600 cm/s LVOT VTI:          0.200 m LVOT/AV VTI ratio: 0.77 AI PHT:            360 msec AR Vena Contracta: 0.50 cm  AORTA Ao Root diam: 3.40 cm Ao Asc diam:  3.30 cm MITRAL VALVE MV Area (PHT): 4.63 cm    SHUNTS MV Decel Time: 164 msec    Systemic VTI:  0.20 m MV E velocity: 85.90 cm/s  Systemic Diam: 2.00 cm MV A velocity: 88.00 cm/s MV E/A ratio:  0.98 Riley Lam MD Electronically signed by Riley Lam MD Signature Date/Time: 11/27/2023/9:13:01 AM    Final    CT  ABDOMEN PELVIS W CONTRAST Result Date: 11/26/2023 CLINICAL DATA:  Increasing weakness for 2-3 days with recent fall.  Scrotal mass or lump, scrotal wound, concern for abscess versus deep space infection. Evidence of metastatic disease on recent chest CTA. * Tracking Code: BO * EXAM: CT ABDOMEN AND PELVIS WITH CONTRAST TECHNIQUE: Multidetector CT imaging of the abdomen and pelvis was performed using the standard protocol following bolus administration of intravenous contrast. RADIATION DOSE REDUCTION: This exam was performed according to the departmental dose-optimization program which includes automated exposure control, adjustment of the mA and/or kV according to patient size and/or use of iterative reconstruction technique. CONTRAST:  75mL OMNIPAQUE IOHEXOL 300 MG/ML  SOLN COMPARISON:  Noncontrast CT of the abdomen and pelvis 03/05/2019. Chest CTA 11/12/2023. FINDINGS: Lower chest: Mild dependent atelectasis at both lung bases. No significant pleural or pericardial effusion. There is aortic and coronary artery atherosclerosis. Partially imaged mass again noted involving the anterior aspect of the right 5th rib, measuring up to 3.8 x 2.9 cm on image 2/1. Hepatobiliary: The liver is normal in density without suspicious focal abnormality. Subcentimeter low-density lesion inferiorly in the right hepatic lobe on image 26/1 is unchanged from remote abdominal CT, likely a cyst. No evidence of gallstones, gallbladder wall thickening or biliary dilatation. Pancreas: Unremarkable. No pancreatic ductal dilatation or surrounding inflammatory changes. Spleen: Normal in size without focal abnormality. Adrenals/Urinary Tract: As seen on recent chest CT, new bilateral adrenal masses consistent with metastatic disease. Right adrenal nodule measures 3.0 x 2.0 cm on image 20/1 and left adrenal nodule measures 2.3 x 2.2 cm on image 24/1. No evidence of urinary tract calculus, suspicious renal lesion or hydronephrosis. There are renal  cysts bilaterally which are similar to remote abdominal CT. The bladder appears unremarkable for its degree of distention. Stomach/Bowel: No definite enteric contrast administered. There is high density colonic stool. The stomach appears unremarkable for its degree of distension. No evidence of bowel wall thickening, distention or surrounding inflammatory change. The appendix appears normal. Vascular/Lymphatic: There are no enlarged abdominal or pelvic lymph nodes. Aortic and branch vessel atherosclerosis without evidence of aneurysm or large vessel occlusion in the abdomen or pelvis. Postsurgical changes at the right groin consistent with femoral arterial bypass grafting. There is low-density within the femoral graft suspicious for occlusion. In addition, there is low-density in the right femoral vein which could reflect a DVT. No intrapelvic extension identified. Reproductive: Status post prostatectomy. No recurrent mass lesion identified. Other: As partially imaged on recent chest CT, there are multiple abdominal wall masses consistent with metastatic disease. Representative lesions include a 3.4 cm mass posteriorly in the left mid abdominal wall on image 33/1, a 3.2 x 2.2 cm mass in the left anterior abdominal wall on image 42/1 and a 2.9 cm right suprapubic mass on image 84/1. No obvious scrotal mass or fluid collection. In addition to the subcutaneous lesions, there are 2 retroperitoneal masses on the right, largest measuring 1.7 cm on image 44/1. There is a supraumbilical hernia containing only fat. No ascites or free air. Musculoskeletal: As above, multiple subcutaneous metastases. In addition, as seen on recent chest CT, there is a large destructive mass involving the left aspect of the T10 vertebral body, measuring 3.5 x 3.0 cm on image 8/1. This mass is associated with intraspinal extension and possible thoracic cord compression, as before. There are other smaller osseous metastases within the L1 and L4  vertebral bodies, the right superior pubic ramus, the left superior acetabulum and the left femoral greater trochanter. IMPRESSION: 1. Findings are consistent with metastatic disease, including multiple subcutaneous metastases, bilateral adrenal masses, right retroperitoneal  masses and multiple osseous metastases. 2. The largest osseous metastasis involves the T10 vertebral body with intraspinal extension and possible thoracic cord compression, as on previous chest CTA. Consider thoracic spine MRI for further evaluation. 3. No obvious scrotal mass or fluid collection. 4. Postsurgical changes at the right groin consistent with femoral arterial bypass grafting. Low-density within the femoral graft is suspicious for occlusion. In addition, there is low-density in the right femoral vein which could reflect a DVT or sequela of poor inflow. No intrapelvic extension identified. 5.  Aortic Atherosclerosis (ICD10-I70.0). Electronically Signed   By: Carey Bullocks M.D.   On: 11/26/2023 15:35   CT HEAD WO CONTRAST ( ) Result Date: 11/26/2023 CLINICAL DATA:  Head trauma, minor (Age >= 65y); Facial trauma, blunt; Neck trauma (Age >= 65y) EXAM: CT HEAD WITHOUT CONTRAST CT MAXILLOFACIAL WITHOUT CONTRAST CT CERVICAL SPINE WITHOUT CONTRAST TECHNIQUE: Multidetector CT imaging of the head, cervical spine, and maxillofacial structures were performed using the standard protocol without intravenous contrast. Multiplanar CT image reconstructions of the cervical spine and maxillofacial structures were also generated. RADIATION DOSE REDUCTION: This exam was performed according to the departmental dose-optimization program which includes automated exposure control, adjustment of the mA and/or kV according to patient size and/or use of iterative reconstruction technique. COMPARISON:  CT Chest 11/12/23 FINDINGS: CT HEAD FINDINGS Brain: No hemorrhage. No hydrocephalus. No extra-axial fluid collection. No CT evidence of cortical infarct.  No mass effect. No mass lesion. Vascular: No hyperdense vessel or unexpected calcification. Skull: Normal. Negative for fracture or focal lesion. Other: Soft tissue hematoma along the left frontal scalp CT MAXILLOFACIAL FINDINGS Osseous: No fracture or mandibular dislocation. No destructive process. Orbits: Negative. No traumatic or inflammatory finding. Sinuses: No middle ear or mastoid effusion. Paranasal sinuses are notable for mucosal thickening left maxillary sinus. Postsurgical changes from prior left maxillary antrostomy. Soft tissues: Soft tissue swelling in the periorbital soft tissues on the left. CT CERVICAL SPINE FINDINGS Alignment: Normal. Skull base and vertebrae: No acute fracture. There is a large lytic lesion involving the T1 spinous process and lamina, worrisome for osseous metastatic disease. Soft tissues and spinal canal: There are multiple enlarged cervical lymph nodes predominantly in the right neck, for example a 2.1 cm right level 3 lymph node (series 5, image 52) and a 2.1 cm right paraesophageal lymph node. Disc levels: There is likely epidural spread of tumor along the dorsal aspect of the spinal canal at the T1 level (series 5, image 63). Upper chest: See separately dictated CT chest abdomen and pelvis for additional findings Other: No IMPRESSION: 1. No acute intracranial abnormality. 2. No acute facial bone fracture. 3. No acute fracture or traumatic listhesis of the cervical spine. 4. Large lytic lesion involving the T1 spinous process and lamina, worrisome for osseous metastatic disease. There is likely epidural spread of tumor along the dorsal aspect of the spinal canal at the T1 level. This is unchanged from 11/12/23 5. Multiple enlarged cervical lymph nodes, predominantly in the right neck, worrisome for nodal metastatic disease. 6. Soft tissue hematoma along the left frontal scalp. No underlying calvarial fracture. Electronically Signed   By: Lorenza Cambridge M.D.   On: 11/26/2023  13:59   CT CERVICAL SPINE WO CONTRAST Result Date: 11/26/2023 CLINICAL DATA:  Head trauma, minor (Age >= 65y); Facial trauma, blunt; Neck trauma (Age >= 65y) EXAM: CT HEAD WITHOUT CONTRAST CT MAXILLOFACIAL WITHOUT CONTRAST CT CERVICAL SPINE WITHOUT CONTRAST TECHNIQUE: Multidetector CT imaging of the head, cervical spine, and maxillofacial structures were  performed using the standard protocol without intravenous contrast. Multiplanar CT image reconstructions of the cervical spine and maxillofacial structures were also generated. RADIATION DOSE REDUCTION: This exam was performed according to the departmental dose-optimization program which includes automated exposure control, adjustment of the mA and/or kV according to patient size and/or use of iterative reconstruction technique. COMPARISON:  CT Chest 11/12/23 FINDINGS: CT HEAD FINDINGS Brain: No hemorrhage. No hydrocephalus. No extra-axial fluid collection. No CT evidence of cortical infarct. No mass effect. No mass lesion. Vascular: No hyperdense vessel or unexpected calcification. Skull: Normal. Negative for fracture or focal lesion. Other: Soft tissue hematoma along the left frontal scalp CT MAXILLOFACIAL FINDINGS Osseous: No fracture or mandibular dislocation. No destructive process. Orbits: Negative. No traumatic or inflammatory finding. Sinuses: No middle ear or mastoid effusion. Paranasal sinuses are notable for mucosal thickening left maxillary sinus. Postsurgical changes from prior left maxillary antrostomy. Soft tissues: Soft tissue swelling in the periorbital soft tissues on the left. CT CERVICAL SPINE FINDINGS Alignment: Normal. Skull base and vertebrae: No acute fracture. There is a large lytic lesion involving the T1 spinous process and lamina, worrisome for osseous metastatic disease. Soft tissues and spinal canal: There are multiple enlarged cervical lymph nodes predominantly in the right neck, for example a 2.1 cm right level 3 lymph node (series  5, image 52) and a 2.1 cm right paraesophageal lymph node. Disc levels: There is likely epidural spread of tumor along the dorsal aspect of the spinal canal at the T1 level (series 5, image 63). Upper chest: See separately dictated CT chest abdomen and pelvis for additional findings Other: No IMPRESSION: 1. No acute intracranial abnormality. 2. No acute facial bone fracture. 3. No acute fracture or traumatic listhesis of the cervical spine. 4. Large lytic lesion involving the T1 spinous process and lamina, worrisome for osseous metastatic disease. There is likely epidural spread of tumor along the dorsal aspect of the spinal canal at the T1 level. This is unchanged from 11/12/23 5. Multiple enlarged cervical lymph nodes, predominantly in the right neck, worrisome for nodal metastatic disease. 6. Soft tissue hematoma along the left frontal scalp. No underlying calvarial fracture. Electronically Signed   By: Lorenza Cambridge M.D.   On: 11/26/2023 13:59   CT Maxillofacial Wo Contrast Result Date: 11/26/2023 CLINICAL DATA:  Head trauma, minor (Age >= 65y); Facial trauma, blunt; Neck trauma (Age >= 65y) EXAM: CT HEAD WITHOUT CONTRAST CT MAXILLOFACIAL WITHOUT CONTRAST CT CERVICAL SPINE WITHOUT CONTRAST TECHNIQUE: Multidetector CT imaging of the head, cervical spine, and maxillofacial structures were performed using the standard protocol without intravenous contrast. Multiplanar CT image reconstructions of the cervical spine and maxillofacial structures were also generated. RADIATION DOSE REDUCTION: This exam was performed according to the departmental dose-optimization program which includes automated exposure control, adjustment of the mA and/or kV according to patient size and/or use of iterative reconstruction technique. COMPARISON:  CT Chest 11/12/23 FINDINGS: CT HEAD FINDINGS Brain: No hemorrhage. No hydrocephalus. No extra-axial fluid collection. No CT evidence of cortical infarct. No mass effect. No mass lesion.  Vascular: No hyperdense vessel or unexpected calcification. Skull: Normal. Negative for fracture or focal lesion. Other: Soft tissue hematoma along the left frontal scalp CT MAXILLOFACIAL FINDINGS Osseous: No fracture or mandibular dislocation. No destructive process. Orbits: Negative. No traumatic or inflammatory finding. Sinuses: No middle ear or mastoid effusion. Paranasal sinuses are notable for mucosal thickening left maxillary sinus. Postsurgical changes from prior left maxillary antrostomy. Soft tissues: Soft tissue swelling in the periorbital soft tissues  on the left. CT CERVICAL SPINE FINDINGS Alignment: Normal. Skull base and vertebrae: No acute fracture. There is a large lytic lesion involving the T1 spinous process and lamina, worrisome for osseous metastatic disease. Soft tissues and spinal canal: There are multiple enlarged cervical lymph nodes predominantly in the right neck, for example a 2.1 cm right level 3 lymph node (series 5, image 52) and a 2.1 cm right paraesophageal lymph node. Disc levels: There is likely epidural spread of tumor along the dorsal aspect of the spinal canal at the T1 level (series 5, image 63). Upper chest: See separately dictated CT chest abdomen and pelvis for additional findings Other: No IMPRESSION: 1. No acute intracranial abnormality. 2. No acute facial bone fracture. 3. No acute fracture or traumatic listhesis of the cervical spine. 4. Large lytic lesion involving the T1 spinous process and lamina, worrisome for osseous metastatic disease. There is likely epidural spread of tumor along the dorsal aspect of the spinal canal at the T1 level. This is unchanged from 11/12/23 5. Multiple enlarged cervical lymph nodes, predominantly in the right neck, worrisome for nodal metastatic disease. 6. Soft tissue hematoma along the left frontal scalp. No underlying calvarial fracture. Electronically Signed   By: Lorenza Cambridge M.D.   On: 11/26/2023 13:59   CT Angio Chest PE W  and/or Wo Contrast Result Date: 11/13/2023 CLINICAL DATA:  Shortness of breath. Concern for pulmonary embolism. EXAM: CT ANGIOGRAPHY CHEST WITH CONTRAST TECHNIQUE: Multidetector CT imaging of the chest was performed using the standard protocol during bolus administration of intravenous contrast. Multiplanar CT image reconstructions and MIPs were obtained to evaluate the vascular anatomy. RADIATION DOSE REDUCTION: This exam was performed according to the departmental dose-optimization program which includes automated exposure control, adjustment of the mA and/or kV according to patient size and/or use of iterative reconstruction technique. CONTRAST:  75mL OMNIPAQUE IOHEXOL 350 MG/ML SOLN COMPARISON:  CT dated 03/05/2019. Chest radiograph dated 11/12/2023. FINDINGS: Cardiovascular: There is no cardiomegaly or pericardial effusion. There is coronary vascular calcification. Moderate atherosclerotic calcification of the thoracic aorta. No aneurysmal dilatation or dissection. The origins of the great vessels of the aortic arch appear patent. No pulmonary artery embolus identified. Mediastinum/Nodes: Right hilar adenopathy measures 2.5 cm in short axis. Subcarinal lymph node measures 2.4 cm. Upper mediastinal adenopathy measures 2 cm in short axis. Right supraclavicular adenopathy measures 2.5 cm short axis. Mildly enlarged and rounded left axillary lymph nodes. The esophagus is grossly unremarkable. No mediastinal fluid collection. Lungs/Pleura: The lungs are clear. There is no pleural effusion or pneumothorax. The central airways are patent. Upper Abdomen: Bilateral adrenal thickening and nodularity concerning for metastatic disease. An 11 mm nodule in the right upper abdomen posterior to the superior pole of the right kidney consistent with metastatic disease. Musculoskeletal: Several metastatic implants in the subcutaneous soft tissues of the posterior thoracic wall measuring up to 3 cm in the left lower posterior  chest wall. Multiple lytic metastasis with involvement of T5, T6, and T10 vertebra. There is associated pathologic fracture of T10 with destruction and buckling of the posterior cortex and soft tissue mass extending into the central canal. There is focal high-grade narrowing of the central canal at T10. MRI may provide better evaluation. Additional lesion involving the right anterior chest wall and fifth rib as well as lytic lesion of the lateral left ninth rib. Review of the MIP images confirms the above findings. IMPRESSION: 1. No CT evidence of pulmonary artery embolus. 2. Mediastinal, right hilar, right supraclavicular, and  left axillary adenopathy. 3. Upper abdominal metastasis involving the adrenal glands. 4. Multiple osseous and chest wall metastatic disease. Associated pathologic fracture of T10 with high-grade focal narrowing of the central canal. MRI may provide better evaluation. 5.  Aortic Atherosclerosis (ICD10-I70.0). Electronically Signed   By: Elgie Collard M.D.   On: 11/13/2023 00:56   DG Chest 2 View Result Date: 11/12/2023 CLINICAL DATA:  Shortness of breath EXAM: CHEST - 2 VIEW COMPARISON:  10/11/2023 FINDINGS: The heart size and mediastinal contours are within normal limits. Aortic atherosclerosis. Minimal linear left basilar scarring or atelectasis. No focal airspace consolidation, pleural effusion, or pneumothorax. The visualized skeletal structures are unremarkable. IMPRESSION: No active cardiopulmonary disease. Electronically Signed   By: Duanne Guess D.O.   On: 11/12/2023 18:05    Labs: BNP (last 3 results) Recent Labs    11/12/23 1519  BNP 18.0   Basic Metabolic Panel: Recent Labs  Lab 12/06/23 0614 12/06/23 2045 12/07/23 0716 12/08/23 0629 12/09/23 0756 12/10/23 0621  NA 130*  --  132* 131* 133* 133*  K 4.7  --  4.3 4.4 3.9 4.2  CL 102  --  103 101 104 104  CO2 23  --  21* 23 23 21*  GLUCOSE 108*  --  127* 119* 108* 134*  BUN 26*  --  34* 33* 33* 35*   CREATININE 0.90  --  1.19 0.97 1.23 1.13  CALCIUM 10.4*  --  9.9 9.4 9.2 8.9  MG 2.1  --   --   --   --   --   PHOS 1.5* 3.1  --   --   --   --    Liver Function Tests: Recent Labs  Lab 12/06/23 0614 12/07/23 0716 12/08/23 0629 12/09/23 0756 12/10/23 0621  AST 57* 51* 50* 51* 72*  ALT 75* 72* 73* 76* 90*  ALKPHOS 100 83 100 111 121  BILITOT 0.5 0.6 0.7 0.7 0.3  PROT 5.4* 5.3* 5.4* 5.4* 5.2*  ALBUMIN 1.5* 2.0* 1.8* 1.7* 1.7*   No results for input(s): "LIPASE", "AMYLASE" in the last 168 hours. No results for input(s): "AMMONIA" in the last 168 hours. CBC: Recent Labs  Lab 12/06/23 0614 12/07/23 0716 12/07/23 1858 12/08/23 0629 12/09/23 0756 12/10/23 0621 12/10/23 1459  WBC 13.4* 11.3*  --  11.5* 10.4 11.3*  --   HGB 7.3* 6.2* 8.2* 7.4* 7.1* 6.7* 8.2*  HCT 23.0* 19.5* 25.7* 23.2* 22.0* 21.2* 26.1*  MCV 88.8 89.0  --  88.9 89.4 89.1  --   PLT 390 333  --  375 360 397  --    Cardiac Enzymes: No results for input(s): "CKTOTAL", "CKMB", "CKMBINDEX", "TROPONINI" in the last 168 hours. BNP: Invalid input(s): "POCBNP" CBG: Recent Labs  Lab 12/06/23 0739 12/06/23 1220  GLUCAP 102* 127*   D-Dimer No results for input(s): "DDIMER" in the last 72 hours. Hgb A1c No results for input(s): "HGBA1C" in the last 72 hours. Lipid Profile No results for input(s): "CHOL", "HDL", "LDLCALC", "TRIG", "CHOLHDL", "LDLDIRECT" in the last 72 hours. Thyroid function studies No results for input(s): "TSH", "T4TOTAL", "T3FREE", "THYROIDAB" in the last 72 hours.  Invalid input(s): "FREET3" Anemia work up No results for input(s): "VITAMINB12", "FOLATE", "FERRITIN", "TIBC", "IRON", "RETICCTPCT" in the last 72 hours. Urinalysis    Component Value Date/Time   COLORURINE YELLOW 11/26/2023 2037   APPEARANCEUR HAZY (A) 11/26/2023 2037   LABSPEC 1.025 11/26/2023 2037   PHURINE 5.0 11/26/2023 2037   GLUCOSEU NEGATIVE 11/26/2023 2037  HGBUR LARGE (A) 11/26/2023 2037   BILIRUBINUR  NEGATIVE 11/26/2023 2037   KETONESUR NEGATIVE 11/26/2023 2037   PROTEINUR NEGATIVE 11/26/2023 2037   NITRITE NEGATIVE 11/26/2023 2037   LEUKOCYTESUR NEGATIVE 11/26/2023 2037   Sepsis Labs Recent Labs  Lab 12/07/23 0716 12/08/23 0629 12/09/23 0756 12/10/23 0621  WBC 11.3* 11.5* 10.4 11.3*   Microbiology No results found for this or any previous visit (from the past 240 hours).   Time coordinating discharge: 25 minutes  SIGNED: Lanae Boast, MD  [facility] 12/11/2023, 11:08 AM  If 7PM-7AM, please contact night-coverage [email or web address]

## 2023-12-10 NOTE — Progress Notes (Signed)
PROGRESS NOTE [name]  LKG:[id number] DOB: January 30, 1951 DOA: 11/26/2023 PCP: [place] Records, FNP  Brief Narrative/Hospital Course: [name] is a 72 y.o. male with a history of hypertension, hyperlipidemia, PAD status post right AKA, CKD stage IIIa, GERD, prostate cancer status post prostatectomy.  Patient presented secondary to being found down on the ground and altered and was found to have evidence of hypotension concerning for septic shock with additional evidence of rhabdomyolysis.  Patient started empirically on antibiotics, started on vasopressor support, and IV fluids and was transferred to [facility] from [facility] for ICU admission.  Antibiotics were discontinued secondary to unlikely infectious etiology.  Hypotension and rhabdomyolysis improved with IV fluids and vasopressor support was discontinued.  Admission complicated by the identification of metastatic disease of unknown primary. Calcium worsening s/p Zometa.  Following Zometa calcium has significantly improved, patient had downtrending of hemoglobin-will need PRBC transfusion 12/13 and hemoglobin overall stable 7.4-8 gm.  Overall prognosis does not appear bright, he is waiting for placement   Subjective: Seen and examined Feel okay no new complaints Overnight afebrile BP in 110. Labs this morning shows hemoglobin further dropped to 6.7 g.  Assessment and Plan: Principal Problem:   Sepsis (HCC) Active Problems:   Normocytic anemia   Hypovolemic shock (HCC)   Rhabdomyolysis   Pressure injury of skin   AKI (acute kidney injury) (HCC)   DVT (deep venous thrombosis) (HCC)   GERD (gastroesophageal reflux disease)   Transaminitis   Malnutrition of moderate degree   Metastatic adenocarcinoma (HCC)  Hypercalcemia of malignancy: W/ ?intermittent confusion but currently mental status improved and stable. S/P zometa on 12/04/23 Recent Labs  Lab 12/06/23 0614 12/07/23 0716  12/08/23 0629 12/09/23 0756 12/10/23 0621  CALCIUM 10.4* 9.9 9.4 9.2 8.9    Hypophosphatemia:Resolved   Poorly differentiated adenocarcinoma of unknown primary History of prostate cancer-f/b Dr Ellin Saba: Soft tissue mass anterior abdominal wall biopsy positive for malignancy as above.  Oncology following. S/p  CT C-spine head/A/P and maxillofacial done on 12/2- shows large lytic lesion T1 likely mets with likely epidural spread, multiple enlarged cervical lymph nodes and neck by adrenal masses multiple bone mets. CT chest 12/3 right hilar mass adenopathy right adrenal mets. Underwent GI eval with EGD 12/7 negative for upper GI malignancy. Seen by Dr. Leigh Aurora on further ID/[employer] [facility] [employer] to see if he is a candidate for targeted therapy or immunotherapy- and will be decided on outpatient basis.  Given overall poor/guarded prognosis palliative care has seen the patient and now patient is DNR/DNI and will fu w/ OP PMT  Acute DVT right upper extremity: Age indeterminate left lower extremity DVT/Chronic right lower extremity DVT Continue Eliquis. Monitor hemoglobin.  Normocytic anemia Anemia of malignancy: Multifactorial due to malignancy, infections,CKD.  S/p1 unit PRBC 12/13- again hb low  at 6.7- will transfuse  1 unit. No.No obvious bleeding. He is on Eliquis, had EGD on 12/7. Cbc in am. Recent Labs  Lab 12/07/23 0716 12/07/23 1858 12/08/23 0629 12/09/23 0756 12/10/23 0621  HGB 6.2* 8.2* 7.4* 7.1* 6.7*  HCT 19.5* 25.7* 23.2* 22.0* 21.2*    Hypovolemic shock Sepsis ruled out. Patient managed in ICU with norepinephrine and fluid resuscitation.  Stable bp  Traumatic rhabdomyolysis: Secondary to being down. CK of 6,948 on admission.Last CK5 as below.Associated elevated AST/ALT. Improvement with IV fluids.    AKI: Creatinine peaked to 2.2 likely AKI now improved   GERD Continue Protonix.  HTN: Remains soft continue to hold  antihypertensives    Hypokalemia hyponatremia Sodium slightly low stable, encourage oral intake    Hyperlipidemia Holding Lipitor> resume upon discharge   PAD right AKA: Cont asa    Scrotal cellulitis/candidiasis MRI without deep infection, abscess, or features necrotizing disease. Infection appears to be improving. Blood cultures (12/6) are no growth to date.  Antibiotics completed 12/06/23.   Sepsis Unclear is present on admission, but recurrent criteria met during hospitalization secondary to scrotal infection. Blood cultures obtained. Empiric vancomycin and Cefepime started for treatment. Blood cultures (12/6) are no growth to date.   Phantom limb pain Noted.   Foley catheter in place Placed secondary to scrotal wound and significant edema.  Removed   Moderate malnutrition Continue to augment nutritional status, RD following Nutrition Problem: Moderate Malnutrition Etiology: acute illness Signs/Symptoms: estimated needs, moderate fat depletion, moderate muscle depletion Interventions: Ensure Enlive (each supplement provides 350kcal and 20 grams of protein), MVI, Liberalize Diet  Goals of care: Changed to DNR 12/14, overall prognosis poor  He will follow-up with outpatient palliative care and hematology oncology   Pressure injury.  Pretibial left stage II POA wound care as below Foley catheter in place to prevent moisture- will do TOV Sunday 12/15 Pressure Injury 11/26/23 Pretibial Distal;Left;Medial Stage 2 -  Partial thickness loss of dermis presenting as a shallow open injury with a red, pink wound bed without slough. Edematous excoriated area (Active)  11/26/23 1845  Location: Pretibial  Location Orientation: Distal;Left;Medial  Staging: Stage 2 -  Partial thickness loss of dermis presenting as a shallow open injury with a red, pink wound bed without slough.  Wound Description (Comments): Edematous excoriated area  Present on Admission: Yes  Dressing Type Foam - Lift dressing to  assess site every shift 12/04/23 1400    DVT prophylaxis: Eliquis Code Status:   Code Status: Limited: Do not attempt resuscitation (DNR) -DNR-LIMITED -Do Not Intubate/DNI  Family Communication: plan of care discussed with patient  at bedside.  I had called patient's sister over the phone no answer left message for call back 12/13 called again 12/14  but no answer. Patient is aware of POC. Patient status is: Remains hospitalized because of severity of illness Level of care: Med-Surg   Dispo: The patient is from: home.  Sister is helping with decision for SNF            Anticipated disposition: SNF tomorrow after blood transfusion  Objective: Vitals last 24 hrs: Vitals:   12/09/23 1213 12/09/23 1938 12/10/23 0600 12/10/23 0741  BP: (!) 102/50 (!) 111/98 (!) 117/50 (!) 105/47  Pulse: 98 100 (!) 109 (!) 109  Resp: 16 18 18 18   Temp: 99.3 F (37.4 C) 100 F (37.8 C) 99.9 F (37.7 C) 97.8 F (36.6 C)  TempSrc: Oral Oral Oral   SpO2: 93% 99% 93% 94%  Weight:   93.9 kg   Height:       Weight change: 0.4 kg  Physical Examination: General exam: alert awake, oriented , weak  HEENT:Oral mucosa moist, Ear/Nose WNL grossly Respiratory system: Bilaterally clear BS,no use of accessory muscle Cardiovascular system: S1 & S2 +, No JVD. Gastrointestinal system: Abdomen soft,NT,ND, BS+ Nervous System: Alert, awake, moving all extremities,and following commands. Extremities: LE edema neg,distal peripheral pulses palpable and warm.  Skin: No rashes,no icterus. MSK: Normal muscle bulk,tone, power   Medications reviewed:  Scheduled Meds:  sodium chloride   Intravenous Once   apixaban  5 mg Oral BID   aspirin  81 mg Oral Daily  docusate sodium  100 mg Oral BID   feeding supplement  237 mL Oral QHS   furosemide  20 mg Oral Daily   Gerhardt's butt cream   Topical BID   multivitamin with minerals  1 tablet Oral Daily   nystatin   Topical BID   Continuous Infusions:  Diet Order              Diet regular Room service appropriate? Yes; Fluid consistency: Thin  Diet effective now                   Intake/Output Summary (Last 24 hours) at 12/10/2023 1043 Last data filed at 12/09/2023 2250 Gross per 24 hour  Intake 240 ml  Output --  Net 240 ml   Net IO Since Admission: -732.78 mL [12/10/23 1043]  Wt Readings from Last 3 Encounters:  12/10/23 93.9 kg  11/12/23 96.6 kg  10/31/23 98.9 kg     Unresulted Labs (From admission, onward)     Start     Ordered   12/10/23 1400  Hemoglobin and hematocrit, blood  Once-Timed,   TIMED       Comments: Please collect 2 hr post transfusion   Question:  Specimen collection method  Answer:  Lab=Lab collect   12/10/23 0858          Data Reviewed: I have personally reviewed following labs and imaging studies CBC: Recent Labs  Lab 12/06/23 0614 12/07/23 0716 12/07/23 1858 12/08/23 0629 12/09/23 0756 12/10/23 0621  WBC 13.4* 11.3*  --  11.5* 10.4 11.3*  HGB 7.3* 6.2* 8.2* 7.4* 7.1* 6.7*  HCT 23.0* 19.5* 25.7* 23.2* 22.0* 21.2*  MCV 88.8 89.0  --  88.9 89.4 89.1  PLT 390 333  --  375 360 397   Basic Metabolic Panel:  Recent Labs  Lab 12/06/23 0614 12/06/23 2045 12/07/23 0716 12/08/23 0629 12/09/23 0756 12/10/23 0621  NA 130*  --  132* 131* 133* 133*  K 4.7  --  4.3 4.4 3.9 4.2  CL 102  --  103 101 104 104  CO2 23  --  21* 23 23 21*  GLUCOSE 108*  --  127* 119* 108* 134*  BUN 26*  --  34* 33* 33* 35*  CREATININE 0.90  --  1.19 0.97 1.23 1.13  CALCIUM 10.4*  --  9.9 9.4 9.2 8.9  MG 2.1  --   --   --   --   --   PHOS 1.5* 3.1  --   --   --   --    GFR: Estimated Creatinine Clearance: 67 mL/min (by C-G formula based on SCr of 1.13 mg/dL). Liver Function Tests:  Recent Labs  Lab 12/06/23 0614 12/07/23 0716 12/08/23 0629 12/09/23 0756 12/10/23 0621  AST 57* 51* 50* 51* 72*  ALT 75* 72* 73* 76* 90*  ALKPHOS 100 83 100 111 121  BILITOT 0.5 0.6 0.7 0.7 0.3  PROT 5.4* 5.3* 5.4* 5.4* 5.2*  ALBUMIN 1.5*  2.0* 1.8* 1.7* 1.7*  Sepsis Labs: No results for input(s): "PROCALCITON", "LATICACIDVEN" in the last 168 hours. No results found for this or any previous visit (from the past 240 hours).   Antimicrobials/Microbiology: Anti-infectives (From admission, onward)    Start     Dose/Rate Route Frequency Ordered Stop   12/03/23 1245  doxycycline (VIBRA-TABS) tablet 100 mg        100 mg Oral Every 12 hours 12/03/23 1155 12/06/23 2125   12/03/23 1245  amoxicillin-clavulanate (AUGMENTIN)  875-125 MG per tablet 1 tablet        1 tablet Oral Every 12 hours 12/03/23 1155 12/06/23 2125   11/30/23 1800  vancomycin (VANCOREADY) IVPB 750 mg/150 mL  Status:  Discontinued        750 mg 150 mL/hr over 60 Minutes Intravenous Every 12 hours 11/30/23 0234 12/03/23 1155   11/30/23 0430  vancomycin (VANCOCIN) IVPB 1000 mg/200 mL premix       Placed in "Followed by" Linked Group   1,000 mg 200 mL/hr over 60 Minutes Intravenous  Once 11/30/23 0237 11/30/23 0659   11/30/23 0400  ceFEPIme (MAXIPIME) 2 g in sodium chloride 0.9 % 100 mL IVPB  Status:  Discontinued        2 g 200 mL/hr over 30 Minutes Intravenous Every 8 hours 11/30/23 0234 12/03/23 1155   11/30/23 0330  vancomycin (VANCOREADY) IVPB 2000 mg/400 mL  Status:  Discontinued        2,000 mg 200 mL/hr over 120 Minutes Intravenous  Once 11/30/23 0234 11/30/23 0236   11/30/23 0330  vancomycin (VANCOCIN) IVPB 1000 mg/200 mL premix       Placed in "Followed by" Linked Group   1,000 mg 200 mL/hr over 60 Minutes Intravenous  Once 11/30/23 0237 11/30/23 0558   11/28/23 1300  vancomycin (VANCOREADY) IVPB 1500 mg/300 mL  Status:  Discontinued        1,500 mg 150 mL/hr over 120 Minutes Intravenous Every 48 hours 11/26/23 1301 11/27/23 1107   11/26/23 2000  piperacillin-tazobactam (ZOSYN) IVPB 3.375 g  Status:  Discontinued        3.375 g 12.5 mL/hr over 240 Minutes Intravenous Every 8 hours 11/26/23 1850 11/28/23 0921   11/26/23 1330  vancomycin (VANCOCIN) IVPB  1000 mg/200 mL premix        1,000 mg 200 mL/hr over 60 Minutes Intravenous Every 1 hr x 2 11/26/23 1251 11/26/23 1617   11/26/23 1245  ceFEPIme (MAXIPIME) 2 g in sodium chloride 0.9 % 100 mL IVPB        2 g 200 mL/hr over 30 Minutes Intravenous  Once 11/26/23 1243 11/26/23 1420         Component Value Date/Time   SDES BLOOD RIGHT ARM 11/30/2023 0526   SPECREQUEST  11/30/2023 0526    BOTTLES DRAWN AEROBIC AND ANAEROBIC Blood Culture results may not be optimal due to an inadequate volume of blood received in culture bottles   CULT  11/30/2023 0526    NO GROWTH 5 DAYS Performed at [facility], [address], [place], [place] [address]    REPTSTATUS 12/05/2023 FINAL 11/30/2023 9811     Radiology Studies: No results found.   LOS: 14 days   Total time spent in review of labs and imaging, patient evaluation, formulation of plan, documentation and communication with family: 35 minutes  Lanae Boast, MD [facility]  12/10/2023, 10:43 AM

## 2023-12-10 NOTE — TOC Progression Note (Signed)
Transition of Care [facility]) - Progression Note    Patient Details  Name: [name] MRN: [id number] Date of Birth: 20-Dec-1951  Transition of Care [facility]) CM/SW Contact  Garritt Molyneux A [place], [place] Phone Number: 12/10/2023, 10:50 AM  Clinical Narrative:     Pt's authorization request approved for [facility].  Pt EDD tomorrow 12/17. Facility notified.   Auth ID [id number] Reference ID [id number]  Approval Dates: 12/08/2023-12/11/2023   TOC will continue to follow.   Expected Discharge Plan: Skilled Nursing Facility Barriers to Discharge: Continued Medical Work up, English as a second language teacher  Expected Discharge Plan and Services       Living arrangements for the past 2 months: Single Family Home                                       Social Determinants of Health (SDOH) Interventions SDOH Screenings   Food Insecurity: Patient Unable To Answer (11/26/2023)  Recent Concern: Food Insecurity - Food Insecurity Present (10/09/2023)  Housing: Patient Unable To Answer (10/09/2023)  Transportation Needs: No Transportation Needs (10/09/2023)  Utilities: Patient Unable To Answer (11/26/2023)  Depression (PHQ2-9): Low Risk  (10/10/2023)  Financial Resource Strain: Medium Risk (10/09/2023)  Physical Activity: Unknown (10/09/2023)  Social Connections: Moderately Integrated (10/09/2023)  Stress: No Stress Concern Present (10/09/2023)  Tobacco Use: Medium Risk (12/01/2023)    Readmission Risk Interventions     No data to display

## 2023-12-10 NOTE — Progress Notes (Signed)
Physical Therapy Treatment Patient Details Name: [name] MRN: [id number] DOB: 1951/05/01 Today's Date: 12/10/2023   History of Present Illness 72 y.o. male admitted 11/26/23 found down with 2-3 day h/o weakness and fall. Workup for rhabdomyolysis, AKI, septic shock. Imaging with L scalp hematoma. CT findings consistent with metastatic disease (multiple subQ masses, bilateral adrenal masses, R RP masses and multiple osseous metastases) with largest osseous metastasis of T10 vertebral body with cord compression. PMH includes prostate CA, PAD, HTN, HLD, R AKA (03/09/23).   PT Comments  Pt slowly progressing with mobility. Today's session focused on bed mobility and seated balance; pt self-limiting at times and requiring max encouragement for activity progression. Pt remains limited by generalized weakness, decreased activity tolerance, poor balance strategies/postural reactions and impaired cognition. Continue to recommend post-acute rehab (<3 hrs/day) to maximize functional mobility and independence. Will follow acutely to address established goals.     If plan is discharge home, recommend the following: Two people to help with walking and/or transfers;Two people to help with bathing/dressing/bathroom   Can travel by private vehicle     No  Equipment Recommendations  Other (comment) (defer to next venue; if home, hoyer lift, hospital bed, BSC, etc...)    Recommendations for Other Services       Precautions / Restrictions Precautions Precautions: Fall;Other (comment) Precaution Comments: h/o R AKA 02/2023 (has prosthetic LE at home); spinal precautions for comfort with mets to spine     Mobility  Bed Mobility Overal bed mobility: Needs Assistance Bed Mobility: Supine to Sit, Sit to Supine     Supine to sit: Max assist, HOB elevated, Used rails Sit to supine: Max assist   General bed mobility comments: maxA for LLE management, pt able to reach BUE support to R-bedrail with  max cues, maxA for scooting L hip and modA for trunk elevation; maxA for LLE management back into bed; modA for shoulder/trunk repositioning to midline, pt able to scoot self up with max cues and encouragement using bedrails with bed and trendelenberg, minA    Transfers                   General transfer comment: pt laying self down before initiation of lateral scoot training    Ambulation/Gait                   Stairs             Wheelchair Mobility     Tilt Bed    Modified Rankin (Stroke Patients Only)       Balance Overall balance assessment: Needs assistance Sitting-balance support: Bilateral upper extremity supported, Feet supported, Single extremity supported Sitting balance-Leahy Scale: Poor Sitting balance - Comments: ultimately reliant on 1-2 UE support for static sitting balance, initially leaning on R elbow on elevated HOB, able to progress to single UE support with L hand holding L knee; frequent posterior LOB requiring min-modA to correct though pt quick to fatigue requiring frequent cues for anterior weight translation                                    Cognition Arousal: Alert Behavior During Therapy: WFL for tasks assessed/performed, Flat affect Overall Cognitive Status: No family/caregiver present to determine baseline cognitive functioning Area of Impairment: Attention, Safety/judgement, Awareness, Problem solving, Following commands  Current Attention Level: Sustained   Following Commands: Follows one step commands with increased time Safety/Judgement: Decreased awareness of deficits Awareness: Emergent Problem Solving: Requires verbal cues, Slow processing General Comments: difficult to determine true cognitive impairment vs effort. pt initially conversing appropriately, likes to take his time to complete task, becoming tearful while sitting and talking incoherently requiring max cues to speak  clearly and express complaints, opting to lay himself back down despite max encouragement to stay up. pt later apologizing, "I'm sorry, I don't know what happened... I promise Ill do more next time"        Exercises General Exercises - Lower Extremity Quad Sets: AROM, Left, Supine Long Arc Quad: AAROM, Seated, Left, 10 reps    General Comments        Pertinent Vitals/Pain Pain Assessment Pain Assessment: Faces Faces Pain Scale: Hurts a little bit Pain Location: generalized, does not specify Pain Intervention(s): Monitored during session    Home Living                          Prior Function            PT Goals (current goals can now be found in the care plan section) Acute Rehab PT Goals Patient Stated Goal: feel better PT Goal Formulation: With patient Time For Goal Achievement: 12/24/23 Potential to Achieve Goals: Fair Progress towards PT goals: Progressing toward goals;Goals updated    Frequency    Min 1X/week      PT Plan      Co-evaluation              AM-PAC PT "6 Clicks" Mobility   Outcome Measure  Help needed turning from your back to your side while in a flat bed without using bedrails?: A Lot Help needed moving from lying on your back to sitting on the side of a flat bed without using bedrails?: A Lot Help needed moving to and from a bed to a chair (including a wheelchair)?: Total Help needed standing up from a chair using your arms (e.g., wheelchair or bedside chair)?: Total Help needed to walk in hospital room?: Total Help needed climbing 3-5 steps with a railing? : Total 6 Click Score: 8    End of Session   Activity Tolerance: Patient limited by fatigue Patient left: in bed;with call bell/phone within reach;with bed alarm set Nurse Communication: Mobility status PT Visit Diagnosis: Other abnormalities of gait and mobility (R26.89);Muscle weakness (generalized) (M62.81);Pain     Time: 4098-1191 PT Time Calculation (min)  (ACUTE ONLY): 24 min  Charges:    $Therapeutic Activity: 23-37 mins PT General Charges $$ ACUTE PT VISIT: 1 Visit                     Ina Homes, PT, DPT Acute Rehabilitation Services  Personal: Secure Chat Rehab Office: [phone]  Malachy Chamber 12/10/2023, 10:46 AM

## 2023-12-11 DIAGNOSIS — R531 Weakness: Secondary | ICD-10-CM | POA: Diagnosis not present

## 2023-12-11 DIAGNOSIS — Z743 Need for continuous supervision: Secondary | ICD-10-CM | POA: Diagnosis not present

## 2023-12-11 DIAGNOSIS — I82501 Chronic embolism and thrombosis of unspecified deep veins of right lower extremity: Secondary | ICD-10-CM | POA: Diagnosis not present

## 2023-12-11 DIAGNOSIS — N3281 Overactive bladder: Secondary | ICD-10-CM | POA: Diagnosis not present

## 2023-12-11 DIAGNOSIS — A419 Sepsis, unspecified organism: Secondary | ICD-10-CM | POA: Diagnosis not present

## 2023-12-11 DIAGNOSIS — N179 Acute kidney failure, unspecified: Secondary | ICD-10-CM | POA: Diagnosis not present

## 2023-12-11 DIAGNOSIS — R52 Pain, unspecified: Secondary | ICD-10-CM | POA: Diagnosis not present

## 2023-12-11 DIAGNOSIS — D6489 Other specified anemias: Secondary | ICD-10-CM | POA: Diagnosis not present

## 2023-12-11 DIAGNOSIS — C801 Malignant (primary) neoplasm, unspecified: Secondary | ICD-10-CM | POA: Diagnosis not present

## 2023-12-11 DIAGNOSIS — R579 Shock, unspecified: Secondary | ICD-10-CM | POA: Diagnosis not present

## 2023-12-11 DIAGNOSIS — I129 Hypertensive chronic kidney disease with stage 1 through stage 4 chronic kidney disease, or unspecified chronic kidney disease: Secondary | ICD-10-CM | POA: Diagnosis not present

## 2023-12-11 DIAGNOSIS — N1831 Chronic kidney disease, stage 3a: Secondary | ICD-10-CM | POA: Diagnosis not present

## 2023-12-11 DIAGNOSIS — Z515 Encounter for palliative care: Secondary | ICD-10-CM | POA: Diagnosis not present

## 2023-12-11 DIAGNOSIS — R6520 Severe sepsis without septic shock: Secondary | ICD-10-CM | POA: Diagnosis not present

## 2023-12-11 DIAGNOSIS — I6523 Occlusion and stenosis of bilateral carotid arteries: Secondary | ICD-10-CM | POA: Diagnosis not present

## 2023-12-11 DIAGNOSIS — G546 Phantom limb syndrome with pain: Secondary | ICD-10-CM | POA: Diagnosis not present

## 2023-12-11 DIAGNOSIS — R4182 Altered mental status, unspecified: Secondary | ICD-10-CM | POA: Diagnosis not present

## 2023-12-11 DIAGNOSIS — D638 Anemia in other chronic diseases classified elsewhere: Secondary | ICD-10-CM | POA: Diagnosis not present

## 2023-12-11 DIAGNOSIS — C7972 Secondary malignant neoplasm of left adrenal gland: Secondary | ICD-10-CM | POA: Diagnosis not present

## 2023-12-11 DIAGNOSIS — C792 Secondary malignant neoplasm of skin: Secondary | ICD-10-CM | POA: Diagnosis not present

## 2023-12-11 DIAGNOSIS — R6521 Severe sepsis with septic shock: Secondary | ICD-10-CM | POA: Diagnosis not present

## 2023-12-11 DIAGNOSIS — L89152 Pressure ulcer of sacral region, stage 2: Secondary | ICD-10-CM | POA: Diagnosis not present

## 2023-12-11 DIAGNOSIS — D649 Anemia, unspecified: Secondary | ICD-10-CM | POA: Diagnosis not present

## 2023-12-11 DIAGNOSIS — I959 Hypotension, unspecified: Secondary | ICD-10-CM | POA: Diagnosis not present

## 2023-12-11 DIAGNOSIS — I82502 Chronic embolism and thrombosis of unspecified deep veins of left lower extremity: Secondary | ICD-10-CM | POA: Diagnosis not present

## 2023-12-11 DIAGNOSIS — Z79899 Other long term (current) drug therapy: Secondary | ICD-10-CM | POA: Diagnosis not present

## 2023-12-11 DIAGNOSIS — C7989 Secondary malignant neoplasm of other specified sites: Secondary | ICD-10-CM | POA: Diagnosis not present

## 2023-12-11 DIAGNOSIS — T796XXD Traumatic ischemia of muscle, subsequent encounter: Secondary | ICD-10-CM | POA: Diagnosis not present

## 2023-12-11 DIAGNOSIS — Z89611 Acquired absence of right leg above knee: Secondary | ICD-10-CM | POA: Diagnosis not present

## 2023-12-11 DIAGNOSIS — I82621 Acute embolism and thrombosis of deep veins of right upper extremity: Secondary | ICD-10-CM | POA: Diagnosis not present

## 2023-12-11 DIAGNOSIS — L89312 Pressure ulcer of right buttock, stage 2: Secondary | ICD-10-CM | POA: Diagnosis not present

## 2023-12-11 DIAGNOSIS — G9349 Other encephalopathy: Secondary | ICD-10-CM | POA: Diagnosis not present

## 2023-12-11 DIAGNOSIS — E2740 Unspecified adrenocortical insufficiency: Secondary | ICD-10-CM | POA: Diagnosis not present

## 2023-12-11 DIAGNOSIS — I82A11 Acute embolism and thrombosis of right axillary vein: Secondary | ICD-10-CM | POA: Diagnosis not present

## 2023-12-11 DIAGNOSIS — C7951 Secondary malignant neoplasm of bone: Secondary | ICD-10-CM | POA: Diagnosis not present

## 2023-12-11 DIAGNOSIS — K219 Gastro-esophageal reflux disease without esophagitis: Secondary | ICD-10-CM | POA: Diagnosis not present

## 2023-12-11 DIAGNOSIS — R0902 Hypoxemia: Secondary | ICD-10-CM | POA: Diagnosis not present

## 2023-12-11 DIAGNOSIS — R578 Other shock: Secondary | ICD-10-CM | POA: Diagnosis not present

## 2023-12-11 DIAGNOSIS — E440 Moderate protein-calorie malnutrition: Secondary | ICD-10-CM | POA: Diagnosis not present

## 2023-12-11 DIAGNOSIS — I499 Cardiac arrhythmia, unspecified: Secondary | ICD-10-CM | POA: Diagnosis not present

## 2023-12-11 DIAGNOSIS — E871 Hypo-osmolality and hyponatremia: Secondary | ICD-10-CM | POA: Diagnosis not present

## 2023-12-11 DIAGNOSIS — R6889 Other general symptoms and signs: Secondary | ICD-10-CM | POA: Diagnosis not present

## 2023-12-11 DIAGNOSIS — I739 Peripheral vascular disease, unspecified: Secondary | ICD-10-CM | POA: Diagnosis not present

## 2023-12-11 DIAGNOSIS — G9340 Encephalopathy, unspecified: Secondary | ICD-10-CM | POA: Diagnosis not present

## 2023-12-11 DIAGNOSIS — J90 Pleural effusion, not elsewhere classified: Secondary | ICD-10-CM | POA: Diagnosis not present

## 2023-12-11 DIAGNOSIS — Z7401 Bed confinement status: Secondary | ICD-10-CM | POA: Diagnosis not present

## 2023-12-11 DIAGNOSIS — L89322 Pressure ulcer of left buttock, stage 2: Secondary | ICD-10-CM | POA: Diagnosis not present

## 2023-12-11 DIAGNOSIS — C7971 Secondary malignant neoplasm of right adrenal gland: Secondary | ICD-10-CM | POA: Diagnosis not present

## 2023-12-11 DIAGNOSIS — M549 Dorsalgia, unspecified: Secondary | ICD-10-CM | POA: Diagnosis not present

## 2023-12-11 DIAGNOSIS — I700 Atherosclerosis of aorta: Secondary | ICD-10-CM | POA: Diagnosis not present

## 2023-12-11 DIAGNOSIS — I82503 Chronic embolism and thrombosis of unspecified deep veins of lower extremity, bilateral: Secondary | ICD-10-CM | POA: Diagnosis not present

## 2023-12-11 DIAGNOSIS — R571 Hypovolemic shock: Secondary | ICD-10-CM | POA: Diagnosis not present

## 2023-12-11 DIAGNOSIS — Z66 Do not resuscitate: Secondary | ICD-10-CM | POA: Diagnosis not present

## 2023-12-11 DIAGNOSIS — M6282 Rhabdomyolysis: Secondary | ICD-10-CM | POA: Diagnosis not present

## 2023-12-11 DIAGNOSIS — E785 Hyperlipidemia, unspecified: Secondary | ICD-10-CM | POA: Diagnosis not present

## 2023-12-11 DIAGNOSIS — Z1152 Encounter for screening for COVID-19: Secondary | ICD-10-CM | POA: Diagnosis not present

## 2023-12-11 DIAGNOSIS — E860 Dehydration: Secondary | ICD-10-CM | POA: Diagnosis not present

## 2023-12-11 DIAGNOSIS — Z7189 Other specified counseling: Secondary | ICD-10-CM | POA: Diagnosis not present

## 2023-12-11 DIAGNOSIS — N390 Urinary tract infection, site not specified: Secondary | ICD-10-CM | POA: Diagnosis not present

## 2023-12-11 LAB — TYPE AND SCREEN
ABO/RH(D): O POS
Antibody Screen: NEGATIVE
Unit division: 0
Unit division: 0

## 2023-12-11 LAB — BPAM RBC
Blood Product Expiration Date: 202501062359
Blood Product Expiration Date: 202412202359
ISSUE DATE / TIME: 202412161048
ISSUE DATE / TIME: 202412131129
Unit Type and Rh: 5100
Unit Type and Rh: 5100

## 2023-12-11 MED ORDER — MELATONIN 3 MG PO TABS: 3.0000 mg | ORAL_TABLET | Freq: Every evening | ORAL | Status: DC | PRN

## 2023-12-11 MED ORDER — ENSURE ENLIVE PO LIQD: 237.0000 mL | Freq: Every day | ORAL | Status: DC

## 2023-12-11 MED ORDER — APIXABAN 5 MG PO TABS: 5.0000 mg | ORAL_TABLET | Freq: Two times a day (BID) | ORAL | Status: DC

## 2023-12-11 MED ORDER — OXYCODONE HCL 5 MG PO TABS: 5.0000 mg | ORAL_TABLET | Freq: Four times a day (QID) | ORAL | 0 refills | Status: DC | PRN

## 2023-12-11 NOTE — Progress Notes (Signed)
Report called to [facility] and all questions answered for Angie, nurse.

## 2023-12-11 NOTE — Progress Notes (Signed)
   Palliative Medicine Inpatient Follow Up Note  Plan for transfer of this patient this afternoon for [facility].  Outpatient Palliative support has been ordered.   No additional Palliative needs at this time.  No Charge ______________________________________________________________________________________ [name] [place] Palliative Medicine Team Team Cell Phone: [phone] Please utilize secure chat with additional questions, if there is no response within 30 minutes please call the above phone number  Palliative Medicine Team providers are available by phone from 7am to 7pm daily and can be reached through the team cell phone.  Should this patient require assistance outside of these hours, please call the patient's attending physician.

## 2023-12-11 NOTE — TOC Transition Note (Signed)
Transition of Care [facility]) - Discharge Note   Patient Details  Name: [name] MRN: [id number] Date of Birth: May 27, 1951  Transition of Care [facility]) CM/SW Contact:  Kemper Heupel A [place], Theresia Majors Phone Number: 12/11/2023, 11:13 AM   Clinical Narrative:     Patient will DC to: [facility]  Anticipated DC date: 12/11/23  Family notified: [name]  Transport by: Theodoro Grist ID [id number] Reference ID [id number]   Per MD patient ready for DC to [facility]. RN, patient, patient's family, and facility notified of DC. Discharge Summary and FL2 sent to facility. RN to call report prior to discharge [phone]). DC packet on chart. Ambulance transport requested for patient.     CSW will sign off for now as social work intervention is no longer needed. Please consult [place] again if new needs arise.   Final next level of care: Skilled Nursing Facility Barriers to Discharge: Barriers Resolved   Patient Goals and CMS Choice Patient states their goals for this hospitalization and ongoing recovery are:: get better          Discharge Placement              Patient chooses bed at: [facility] Patient to be transferred to facility by: [facility] Name of family member notified: [name] Patient and family notified of of transfer: 12/11/23  Discharge Plan and Services Additional resources added to the After Visit Summary for                                       Social Drivers of Health (SDOH) Interventions SDOH Screenings   Food Insecurity: Patient Unable To Answer (11/26/2023)  Recent Concern: Food Insecurity - Food Insecurity Present (10/09/2023)  Housing: Patient Unable To Answer (10/09/2023)  Transportation Needs: No Transportation Needs (10/09/2023)  Utilities: Patient Unable To Answer (11/26/2023)  Depression (PHQ2-9): Low Risk  (10/10/2023)  Financial Resource Strain: Medium Risk (10/09/2023)  Physical Activity: Unknown (10/09/2023)  Social Connections:  Moderately Integrated (10/09/2023)  Stress: No Stress Concern Present (10/09/2023)  Tobacco Use: Medium Risk (12/01/2023)     Readmission Risk Interventions     No data to display

## 2023-12-12 DIAGNOSIS — N1831 Chronic kidney disease, stage 3a: Secondary | ICD-10-CM | POA: Diagnosis not present

## 2023-12-12 DIAGNOSIS — G546 Phantom limb syndrome with pain: Secondary | ICD-10-CM | POA: Diagnosis not present

## 2023-12-12 DIAGNOSIS — E785 Hyperlipidemia, unspecified: Secondary | ICD-10-CM | POA: Diagnosis not present

## 2023-12-12 DIAGNOSIS — C801 Malignant (primary) neoplasm, unspecified: Secondary | ICD-10-CM | POA: Diagnosis not present

## 2023-12-12 DIAGNOSIS — I129 Hypertensive chronic kidney disease with stage 1 through stage 4 chronic kidney disease, or unspecified chronic kidney disease: Secondary | ICD-10-CM | POA: Diagnosis not present

## 2023-12-12 DIAGNOSIS — C7951 Secondary malignant neoplasm of bone: Secondary | ICD-10-CM | POA: Diagnosis not present

## 2023-12-12 DIAGNOSIS — C7971 Secondary malignant neoplasm of right adrenal gland: Secondary | ICD-10-CM | POA: Diagnosis not present

## 2023-12-12 DIAGNOSIS — I82503 Chronic embolism and thrombosis of unspecified deep veins of lower extremity, bilateral: Secondary | ICD-10-CM | POA: Diagnosis not present

## 2023-12-12 DIAGNOSIS — Z89611 Acquired absence of right leg above knee: Secondary | ICD-10-CM | POA: Diagnosis not present

## 2023-12-12 DIAGNOSIS — I82A11 Acute embolism and thrombosis of right axillary vein: Secondary | ICD-10-CM | POA: Diagnosis not present

## 2023-12-13 DIAGNOSIS — C7951 Secondary malignant neoplasm of bone: Secondary | ICD-10-CM | POA: Diagnosis not present

## 2023-12-13 DIAGNOSIS — C801 Malignant (primary) neoplasm, unspecified: Secondary | ICD-10-CM | POA: Diagnosis not present

## 2023-12-13 DIAGNOSIS — R571 Hypovolemic shock: Secondary | ICD-10-CM | POA: Diagnosis not present

## 2023-12-13 DIAGNOSIS — I82A11 Acute embolism and thrombosis of right axillary vein: Secondary | ICD-10-CM | POA: Diagnosis not present

## 2023-12-13 DIAGNOSIS — D6489 Other specified anemias: Secondary | ICD-10-CM | POA: Diagnosis not present

## 2023-12-13 DIAGNOSIS — I82503 Chronic embolism and thrombosis of unspecified deep veins of lower extremity, bilateral: Secondary | ICD-10-CM | POA: Diagnosis not present

## 2023-12-13 DIAGNOSIS — Z79899 Other long term (current) drug therapy: Secondary | ICD-10-CM | POA: Diagnosis not present

## 2023-12-13 DIAGNOSIS — T796XXD Traumatic ischemia of muscle, subsequent encounter: Secondary | ICD-10-CM | POA: Diagnosis not present

## 2023-12-13 DIAGNOSIS — Z7189 Other specified counseling: Secondary | ICD-10-CM | POA: Diagnosis not present

## 2023-12-14 ENCOUNTER — Encounter (HOSPITAL_COMMUNITY): Payer: Self-pay

## 2023-12-14 DIAGNOSIS — D6489 Other specified anemias: Secondary | ICD-10-CM | POA: Diagnosis not present

## 2023-12-14 DIAGNOSIS — C801 Malignant (primary) neoplasm, unspecified: Secondary | ICD-10-CM | POA: Diagnosis not present

## 2023-12-17 DIAGNOSIS — E785 Hyperlipidemia, unspecified: Secondary | ICD-10-CM | POA: Diagnosis not present

## 2023-12-17 DIAGNOSIS — Z79899 Other long term (current) drug therapy: Secondary | ICD-10-CM | POA: Diagnosis not present

## 2023-12-17 DIAGNOSIS — D6489 Other specified anemias: Secondary | ICD-10-CM | POA: Diagnosis not present

## 2023-12-17 DIAGNOSIS — N1831 Chronic kidney disease, stage 3a: Secondary | ICD-10-CM | POA: Diagnosis not present

## 2023-12-17 DIAGNOSIS — I129 Hypertensive chronic kidney disease with stage 1 through stage 4 chronic kidney disease, or unspecified chronic kidney disease: Secondary | ICD-10-CM | POA: Diagnosis not present

## 2023-12-20 DIAGNOSIS — R52 Pain, unspecified: Secondary | ICD-10-CM | POA: Diagnosis not present

## 2023-12-20 DIAGNOSIS — Z89611 Acquired absence of right leg above knee: Secondary | ICD-10-CM | POA: Diagnosis not present

## 2023-12-20 DIAGNOSIS — I739 Peripheral vascular disease, unspecified: Secondary | ICD-10-CM | POA: Diagnosis not present

## 2023-12-20 DIAGNOSIS — K219 Gastro-esophageal reflux disease without esophagitis: Secondary | ICD-10-CM | POA: Diagnosis not present

## 2023-12-20 DIAGNOSIS — G546 Phantom limb syndrome with pain: Secondary | ICD-10-CM | POA: Diagnosis not present

## 2023-12-20 DIAGNOSIS — C801 Malignant (primary) neoplasm, unspecified: Secondary | ICD-10-CM | POA: Diagnosis not present

## 2023-12-24 DIAGNOSIS — D6489 Other specified anemias: Secondary | ICD-10-CM | POA: Diagnosis not present

## 2023-12-24 DIAGNOSIS — Z79899 Other long term (current) drug therapy: Secondary | ICD-10-CM | POA: Diagnosis not present

## 2023-12-26 DIAGNOSIS — N390 Urinary tract infection, site not specified: Secondary | ICD-10-CM | POA: Diagnosis not present

## 2023-12-27 ENCOUNTER — Inpatient Hospital Stay (HOSPITAL_COMMUNITY): Payer: Medicare Other

## 2023-12-27 ENCOUNTER — Inpatient Hospital Stay (HOSPITAL_COMMUNITY)
Admission: EM | Admit: 2023-12-27 | Discharge: 2024-01-01 | DRG: 871 | Disposition: A | Discharge status: Hospice | Payer: Medicare Other | Source: Skilled Nursing Facility | Attending: Internal Medicine | Admitting: Internal Medicine

## 2023-12-27 ENCOUNTER — Other Ambulatory Visit: Payer: Self-pay

## 2023-12-27 ENCOUNTER — Encounter (HOSPITAL_COMMUNITY): Payer: Self-pay | Admitting: Emergency Medicine

## 2023-12-27 ENCOUNTER — Emergency Department (HOSPITAL_COMMUNITY): Payer: Medicare Other

## 2023-12-27 DIAGNOSIS — Z1152 Encounter for screening for COVID-19: Secondary | ICD-10-CM | POA: Diagnosis not present

## 2023-12-27 DIAGNOSIS — D638 Anemia in other chronic diseases classified elsewhere: Secondary | ICD-10-CM | POA: Diagnosis present

## 2023-12-27 DIAGNOSIS — Z9079 Acquired absence of other genital organ(s): Secondary | ICD-10-CM

## 2023-12-27 DIAGNOSIS — I959 Hypotension, unspecified: Secondary | ICD-10-CM | POA: Diagnosis not present

## 2023-12-27 DIAGNOSIS — E871 Hypo-osmolality and hyponatremia: Secondary | ICD-10-CM | POA: Diagnosis not present

## 2023-12-27 DIAGNOSIS — A419 Sepsis, unspecified organism: Secondary | ICD-10-CM | POA: Diagnosis not present

## 2023-12-27 DIAGNOSIS — N1831 Chronic kidney disease, stage 3a: Secondary | ICD-10-CM | POA: Diagnosis not present

## 2023-12-27 DIAGNOSIS — Z7901 Long term (current) use of anticoagulants: Secondary | ICD-10-CM

## 2023-12-27 DIAGNOSIS — Z7189 Other specified counseling: Secondary | ICD-10-CM | POA: Diagnosis not present

## 2023-12-27 DIAGNOSIS — Z7401 Bed confinement status: Secondary | ICD-10-CM

## 2023-12-27 DIAGNOSIS — N179 Acute kidney failure, unspecified: Secondary | ICD-10-CM | POA: Diagnosis not present

## 2023-12-27 DIAGNOSIS — L89152 Pressure ulcer of sacral region, stage 2: Secondary | ICD-10-CM | POA: Diagnosis not present

## 2023-12-27 DIAGNOSIS — R0902 Hypoxemia: Secondary | ICD-10-CM | POA: Diagnosis not present

## 2023-12-27 DIAGNOSIS — Z8042 Family history of malignant neoplasm of prostate: Secondary | ICD-10-CM

## 2023-12-27 DIAGNOSIS — Z66 Do not resuscitate: Secondary | ICD-10-CM | POA: Diagnosis present

## 2023-12-27 DIAGNOSIS — E860 Dehydration: Secondary | ICD-10-CM | POA: Diagnosis not present

## 2023-12-27 DIAGNOSIS — M6282 Rhabdomyolysis: Secondary | ICD-10-CM | POA: Diagnosis not present

## 2023-12-27 DIAGNOSIS — R579 Shock, unspecified: Secondary | ICD-10-CM | POA: Diagnosis not present

## 2023-12-27 DIAGNOSIS — Z79899 Other long term (current) drug therapy: Secondary | ICD-10-CM

## 2023-12-27 DIAGNOSIS — R4182 Altered mental status, unspecified: Principal | ICD-10-CM

## 2023-12-27 DIAGNOSIS — Z89611 Acquired absence of right leg above knee: Secondary | ICD-10-CM | POA: Diagnosis not present

## 2023-12-27 DIAGNOSIS — M549 Dorsalgia, unspecified: Secondary | ICD-10-CM | POA: Diagnosis not present

## 2023-12-27 DIAGNOSIS — G9349 Other encephalopathy: Secondary | ICD-10-CM | POA: Diagnosis present

## 2023-12-27 DIAGNOSIS — Z743 Need for continuous supervision: Secondary | ICD-10-CM | POA: Diagnosis not present

## 2023-12-27 DIAGNOSIS — E785 Hyperlipidemia, unspecified: Secondary | ICD-10-CM | POA: Diagnosis present

## 2023-12-27 DIAGNOSIS — R6520 Severe sepsis without septic shock: Secondary | ICD-10-CM | POA: Diagnosis not present

## 2023-12-27 DIAGNOSIS — I129 Hypertensive chronic kidney disease with stage 1 through stage 4 chronic kidney disease, or unspecified chronic kidney disease: Secondary | ICD-10-CM | POA: Diagnosis not present

## 2023-12-27 DIAGNOSIS — J90 Pleural effusion, not elsewhere classified: Secondary | ICD-10-CM | POA: Diagnosis not present

## 2023-12-27 DIAGNOSIS — Z87891 Personal history of nicotine dependence: Secondary | ICD-10-CM

## 2023-12-27 DIAGNOSIS — C801 Malignant (primary) neoplasm, unspecified: Secondary | ICD-10-CM | POA: Diagnosis not present

## 2023-12-27 DIAGNOSIS — Z86718 Personal history of other venous thrombosis and embolism: Secondary | ICD-10-CM

## 2023-12-27 DIAGNOSIS — R6889 Other general symptoms and signs: Secondary | ICD-10-CM | POA: Diagnosis not present

## 2023-12-27 DIAGNOSIS — Z515 Encounter for palliative care: Secondary | ICD-10-CM

## 2023-12-27 DIAGNOSIS — E2740 Unspecified adrenocortical insufficiency: Secondary | ICD-10-CM | POA: Diagnosis present

## 2023-12-27 DIAGNOSIS — G9340 Encephalopathy, unspecified: Secondary | ICD-10-CM | POA: Diagnosis not present

## 2023-12-27 DIAGNOSIS — K219 Gastro-esophageal reflux disease without esophagitis: Secondary | ICD-10-CM | POA: Diagnosis present

## 2023-12-27 DIAGNOSIS — I739 Peripheral vascular disease, unspecified: Secondary | ICD-10-CM | POA: Diagnosis not present

## 2023-12-27 DIAGNOSIS — C7972 Secondary malignant neoplasm of left adrenal gland: Secondary | ICD-10-CM | POA: Diagnosis present

## 2023-12-27 DIAGNOSIS — Z91013 Allergy to seafood: Secondary | ICD-10-CM

## 2023-12-27 DIAGNOSIS — I6523 Occlusion and stenosis of bilateral carotid arteries: Secondary | ICD-10-CM | POA: Diagnosis not present

## 2023-12-27 DIAGNOSIS — Z923 Personal history of irradiation: Secondary | ICD-10-CM

## 2023-12-27 DIAGNOSIS — I499 Cardiac arrhythmia, unspecified: Secondary | ICD-10-CM | POA: Diagnosis not present

## 2023-12-27 DIAGNOSIS — R578 Other shock: Secondary | ICD-10-CM | POA: Diagnosis not present

## 2023-12-27 DIAGNOSIS — I700 Atherosclerosis of aorta: Secondary | ICD-10-CM | POA: Diagnosis not present

## 2023-12-27 DIAGNOSIS — R601 Generalized edema: Secondary | ICD-10-CM | POA: Diagnosis present

## 2023-12-27 DIAGNOSIS — C7951 Secondary malignant neoplasm of bone: Secondary | ICD-10-CM | POA: Diagnosis present

## 2023-12-27 DIAGNOSIS — L89322 Pressure ulcer of left buttock, stage 2: Secondary | ICD-10-CM | POA: Diagnosis not present

## 2023-12-27 DIAGNOSIS — L89312 Pressure ulcer of right buttock, stage 2: Secondary | ICD-10-CM | POA: Diagnosis present

## 2023-12-27 DIAGNOSIS — C7971 Secondary malignant neoplasm of right adrenal gland: Secondary | ICD-10-CM | POA: Diagnosis present

## 2023-12-27 DIAGNOSIS — D6489 Other specified anemias: Secondary | ICD-10-CM | POA: Diagnosis not present

## 2023-12-27 DIAGNOSIS — Z8546 Personal history of malignant neoplasm of prostate: Secondary | ICD-10-CM

## 2023-12-27 LAB — CBC WITH DIFFERENTIAL/PLATELET
Abs Immature Granulocytes: 0.09 10*3/uL — ABNORMAL HIGH (ref 0.00–0.07)
Basophils Absolute: 0 10*3/uL (ref 0.0–0.1)
Basophils Relative: 0 %
Eosinophils Absolute: 0 10*3/uL (ref 0.0–0.5)
Eosinophils Relative: 0 %
HCT: 24.2 % — ABNORMAL LOW (ref 39.0–52.0)
Hemoglobin: 7.5 g/dL — ABNORMAL LOW (ref 13.0–17.0)
Immature Granulocytes: 1 %
Lymphocytes Relative: 10 %
Lymphs Abs: 1.7 10*3/uL (ref 0.7–4.0)
MCH: 28 pg (ref 26.0–34.0)
MCHC: 31 g/dL (ref 30.0–36.0)
MCV: 90.3 fL (ref 80.0–100.0)
Monocytes Absolute: 0.9 10*3/uL (ref 0.1–1.0)
Monocytes Relative: 6 %
Neutro Abs: 13.5 10*3/uL — ABNORMAL HIGH (ref 1.7–7.7)
Neutrophils Relative %: 83 %
Platelets: 219 10*3/uL (ref 150–400)
RBC: 2.68 MIL/uL — ABNORMAL LOW (ref 4.22–5.81)
RDW: 17 % — ABNORMAL HIGH (ref 11.5–15.5)
WBC: 16.2 10*3/uL — ABNORMAL HIGH (ref 4.0–10.5)
nRBC: 0 % (ref 0.0–0.2)

## 2023-12-27 LAB — COMPREHENSIVE METABOLIC PANEL
ALT: 33 U/L (ref 0–44)
AST: 56 U/L — ABNORMAL HIGH (ref 15–41)
Albumin: 1.7 g/dL — ABNORMAL LOW (ref 3.5–5.0)
Alkaline Phosphatase: 132 U/L — ABNORMAL HIGH (ref 38–126)
Anion gap: 13 (ref 5–15)
BUN: 50 mg/dL — ABNORMAL HIGH (ref 8–23)
CO2: 19 mmol/L — ABNORMAL LOW (ref 22–32)
Calcium: 7.8 mg/dL — ABNORMAL LOW (ref 8.9–10.3)
Chloride: 100 mmol/L (ref 98–111)
Creatinine, Ser: 1.67 mg/dL — ABNORMAL HIGH (ref 0.61–1.24)
GFR, Estimated: 43 mL/min — ABNORMAL LOW (ref >60)
Glucose, Bld: 80 mg/dL (ref 70–99)
Potassium: 4.7 mmol/L (ref 3.5–5.1)
Sodium: 132 mmol/L — ABNORMAL LOW (ref 135–145)
Total Bilirubin: 0.8 mg/dL (ref 0.0–1.2)
Total Protein: 6.6 g/dL (ref 6.5–8.1)

## 2023-12-27 LAB — RESP PANEL BY RT-PCR (RSV, FLU A&B, COVID)  RVPGX2
Influenza A by PCR: NEGATIVE
Influenza B by PCR: NEGATIVE
Resp Syncytial Virus by PCR: NEGATIVE
SARS Coronavirus 2 by RT PCR: NEGATIVE

## 2023-12-27 LAB — LACTIC ACID, PLASMA
Lactic Acid, Venous: 1.2 mmol/L (ref 0.5–1.9)
Lactic Acid, Venous: 2 mmol/L (ref 0.5–1.9)

## 2023-12-27 LAB — PHOSPHORUS: Phosphorus: 4.6 mg/dL (ref 2.5–4.6)

## 2023-12-27 LAB — APTT: aPTT: 47 s — ABNORMAL HIGH (ref 24–36)

## 2023-12-27 LAB — PROTIME-INR
INR: 2.8 — ABNORMAL HIGH (ref 0.8–1.2)
Prothrombin Time: 29.7 s — ABNORMAL HIGH (ref 11.4–15.2)

## 2023-12-27 LAB — TSH: TSH: 1.371 u[IU]/mL (ref 0.350–4.500)

## 2023-12-27 LAB — CK: Total CK: 221 U/L (ref 49–397)

## 2023-12-27 MED ORDER — FERROUS SULFATE 325 (65 FE) MG PO TABS
325.0000 mg | ORAL_TABLET | Freq: Every day | ORAL | Status: DC
  Administered 2023-12-28 – 2023-12-30 (×3): 325 mg via ORAL
  Filled 2023-12-27 (×3): qty 1

## 2023-12-27 MED ORDER — METRONIDAZOLE 500 MG/100ML IV SOLN
500.0000 mg | Freq: Once | INTRAVENOUS | Status: AC
  Administered 2023-12-27: 500 mg via INTRAVENOUS
  Filled 2023-12-27: qty 100

## 2023-12-27 MED ORDER — OXYCODONE HCL 5 MG PO TABS
2.5000 mg | ORAL_TABLET | Freq: Four times a day (QID) | ORAL | Status: DC | PRN
  Administered 2023-12-30: 2.5 mg via ORAL
  Filled 2023-12-27: qty 1

## 2023-12-27 MED ORDER — METRONIDAZOLE 500 MG PO TABS
500.0000 mg | ORAL_TABLET | Freq: Two times a day (BID) | ORAL | Status: DC
  Administered 2023-12-28 – 2023-12-30 (×5): 500 mg via ORAL
  Filled 2023-12-27 (×5): qty 1

## 2023-12-27 MED ORDER — VANCOMYCIN HCL IN DEXTROSE 1-5 GM/200ML-% IV SOLN
1000.0000 mg | INTRAVENOUS | Status: AC
  Administered 2023-12-27: 1000 mg via INTRAVENOUS
  Filled 2023-12-27: qty 200

## 2023-12-27 MED ORDER — SODIUM BICARBONATE 650 MG PO TABS
650.0000 mg | ORAL_TABLET | Freq: Two times a day (BID) | ORAL | Status: DC
  Administered 2023-12-28 – 2023-12-30 (×5): 650 mg via ORAL
  Filled 2023-12-27 (×5): qty 1

## 2023-12-27 MED ORDER — SODIUM CHLORIDE 0.9 % IV SOLN
2.0000 g | Freq: Two times a day (BID) | INTRAVENOUS | Status: DC
Start: 2023-12-28 — End: 2023-12-30
  Administered 2023-12-28 – 2023-12-30 (×5): 2 g via INTRAVENOUS
  Filled 2023-12-27 (×5): qty 12.5

## 2023-12-27 MED ORDER — ONDANSETRON HCL 4 MG/2ML IJ SOLN: 4.0000 mg | Freq: Four times a day (QID) | INTRAMUSCULAR | Status: DC | PRN

## 2023-12-27 MED ORDER — ALBUMIN HUMAN 25 % IV SOLN
50.0000 g | Freq: Once | INTRAVENOUS | Status: AC
  Administered 2023-12-27: 50 g via INTRAVENOUS
  Filled 2023-12-27: qty 200

## 2023-12-27 MED ORDER — ATORVASTATIN CALCIUM 40 MG PO TABS: 40.0000 mg | ORAL_TABLET | Freq: Every morning | ORAL | Status: DC

## 2023-12-27 MED ORDER — POLYETHYLENE GLYCOL 3350 17 G PO PACK: 17.0000 g | PACK | Freq: Every day | ORAL | Status: DC | PRN

## 2023-12-27 MED ORDER — SODIUM CHLORIDE 0.9 % IV SOLN
2.0000 g | Freq: Once | INTRAVENOUS | Status: AC
  Administered 2023-12-27: 2 g via INTRAVENOUS
  Filled 2023-12-27: qty 12.5

## 2023-12-27 MED ORDER — SODIUM CHLORIDE 0.9% FLUSH
3.0000 mL | Freq: Two times a day (BID) | INTRAVENOUS | Status: DC
  Administered 2023-12-27 – 2024-01-01 (×10): 3 mL via INTRAVENOUS

## 2023-12-27 MED ORDER — ONDANSETRON HCL 4 MG PO TABS: 4.0000 mg | ORAL_TABLET | Freq: Four times a day (QID) | ORAL | Status: DC | PRN

## 2023-12-27 MED ORDER — LACTATED RINGERS IV BOLUS
1000.0000 mL | Freq: Once | INTRAVENOUS | Status: AC
  Administered 2023-12-27: 1000 mL via INTRAVENOUS

## 2023-12-27 MED ORDER — VANCOMYCIN HCL IN DEXTROSE 1-5 GM/200ML-% IV SOLN
1000.0000 mg | INTRAVENOUS | Status: DC
  Administered 2023-12-27 – 2023-12-29 (×3): 1000 mg via INTRAVENOUS
  Filled 2023-12-27 (×3): qty 200

## 2023-12-27 MED ORDER — MELATONIN 3 MG PO TABS
6.0000 mg | ORAL_TABLET | Freq: Every day | ORAL | Status: DC
  Administered 2023-12-28 – 2023-12-31 (×4): 6 mg via ORAL
  Filled 2023-12-27 (×4): qty 2

## 2023-12-27 MED ORDER — PANTOPRAZOLE SODIUM 20 MG PO TBEC
20.0000 mg | DELAYED_RELEASE_TABLET | Freq: Every day | ORAL | Status: DC
  Filled 2023-12-27: qty 1

## 2023-12-27 MED ORDER — ACETAMINOPHEN 500 MG PO TABS: 1000.0000 mg | ORAL_TABLET | Freq: Four times a day (QID) | ORAL | Status: DC | PRN

## 2023-12-27 MED ORDER — APIXABAN 5 MG PO TABS: 5.0000 mg | ORAL_TABLET | Freq: Two times a day (BID) | ORAL | Status: DC

## 2023-12-27 MED ORDER — SENNOSIDES-DOCUSATE SODIUM 8.6-50 MG PO TABS
2.0000 | ORAL_TABLET | Freq: Every day | ORAL | Status: DC
  Administered 2023-12-28 – 2024-01-01 (×3): 2 via ORAL
  Filled 2023-12-27 (×5): qty 2

## 2023-12-27 MED ORDER — LACTATED RINGERS IV BOLUS (SEPSIS)
1000.0000 mL | Freq: Once | INTRAVENOUS | Status: AC
  Administered 2023-12-27: 1000 mL via INTRAVENOUS

## 2023-12-27 MED ORDER — LACTATED RINGERS IV BOLUS (SEPSIS)
1000.0000 mL | Freq: Once | INTRAVENOUS | Status: AC
Start: 2023-12-27 — End: 2023-12-27
  Administered 2023-12-27: 1000 mL via INTRAVENOUS

## 2023-12-27 MED ORDER — VANCOMYCIN HCL IN DEXTROSE 1-5 GM/200ML-% IV SOLN: 1000.0000 mg | Freq: Once | INTRAVENOUS | Status: DC

## 2023-12-27 MED ORDER — ALBUTEROL SULFATE (2.5 MG/3ML) 0.083% IN NEBU: 2.5000 mg | INHALATION_SOLUTION | RESPIRATORY_TRACT | Status: DC | PRN

## 2023-12-27 MED ORDER — COSYNTROPIN 0.25 MG IJ SOLR
0.2500 mg | Freq: Once | INTRAMUSCULAR | Status: AC
  Administered 2023-12-28: 0.25 mg via INTRAVENOUS
  Filled 2023-12-27 (×2): qty 0.25

## 2023-12-27 MED ORDER — MIDODRINE HCL 5 MG PO TABS
10.0000 mg | ORAL_TABLET | Freq: Three times a day (TID) | ORAL | Status: DC
  Administered 2023-12-27 – 2023-12-30 (×9): 10 mg via ORAL
  Filled 2023-12-27 (×8): qty 2

## 2023-12-27 MED ORDER — LACTATED RINGERS IV SOLN: INTRAVENOUS | Status: DC

## 2023-12-27 NOTE — ED Provider Notes (Addendum)
  EMERGENCY DEPARTMENT AT [facility] Provider Note   CSN: [id number] Arrival date & time: 12/27/23  1611     History  Chief Complaint  Patient presents with   Altered Mental Status    [name] is a 73 y.o. male.  Patient brought in by EMS from [facility] nursing home.  They said he had decreased appetite increased weakness confusion hypotension tachycardia.  Patient is awake here and will answer questions.  Patient's blood pressure upon arrival here was 81/46.  Heart rate 129 temp 98.8 respirations 12.  Oxygen saturation initially 93%.  Chart review shows the patient is a DNR.  Patient was recently admitted December 2 through December 17 at [facility].  Patient known to have hypertension hyperlipidemia peripheral artery disease status post right AKA chronic kidney disease stage IIIa.  Prostate cancer status post prostatectomy.  Patient presented at that time secondary to being found on the ground altered and was found to have evidence of hypotension concerning for septic shock and he had evidence of rhabdomyolysis.  He was started on empirical antibiotics and started on vasopressor support and IV fluids and was transferred to [facility] from [facility] for ICU admission.  Patient here today with similar presentation.  From an oncology standpoint they were thinking he would be hospice.  Was to follow-up with [facility].       Home Medications Prior to Admission medications   Medication Sig Start Date End Date Taking? Authorizing Provider  acetaminophen  (TYLENOL ) 325 MG tablet Take 1-2 tablets (325-650 mg total) by mouth every 4 (four) hours as needed for mild pain. 03/26/23   Love, Sharlet RAMAN, PA-C  albuterol  (VENTOLIN  HFA) 108 (90 Base) MCG/ACT inhaler Inhale 2 puffs into the lungs every 6 (six) hours as needed for wheezing or shortness of breath. 10/19/23   Del Wilhelmena Lloyd Sola, FNP  apixaban  (ELIQUIS ) 5 MG TABS tablet Take 1  tablet (5 mg total) by mouth 2 (two) times daily. 12/11/23   Christobal Guadalajara, MD  atorvastatin  (LIPITOR) 40 MG tablet Take 1 tablet (40 mg total) by mouth every morning. 04/05/23   Sheree Penne Bruckner, MD  benzonatate  (TESSALON ) 200 MG capsule Take 1 capsule (200 mg total) by mouth 2 (two) times daily as needed for cough. 10/10/23   Del Wilhelmena Lloyd Sola, FNP  cyclobenzaprine  (FLEXERIL ) 5 MG tablet Take 1 tablet (5 mg total) by mouth 3 (three) times daily as needed for muscle spasms. 04/25/23   Lovorn, Megan, MD  feeding supplement (ENSURE ENLIVE / ENSURE PLUS) LIQD Take 237 mLs by mouth at bedtime. 12/11/23   Christobal Guadalajara, MD  gabapentin  (NEURONTIN ) 100 MG capsule Take 2 capsules (200 mg total) by mouth 2 (two) times daily. Along with 300 mg nightly- for phantom pain- due to R AKA 11/16/23   Lovorn, Megan, MD  iron  polysaccharides (NIFEREX) 150 MG capsule Take 1 capsule (150 mg total) by mouth daily. 10/19/23   Del Orbe Polanco, Iliana, FNP  levocetirizine (XYZAL ) 5 MG tablet Take 1 tablet (5 mg total) by mouth every evening. 05/24/23   Del Orbe Polanco, Iliana, FNP  melatonin 3 MG TABS tablet Take 1 tablet (3 mg total) by mouth at bedtime as needed. 12/11/23   Christobal Guadalajara, MD  Multiple Vitamins-Minerals (CENTRUM SILVER PO) Take 1 tablet by mouth daily.     [provider]  oxybutynin  (DITROPAN -XL) 10 MG 24 hr tablet Take 10 mg by mouth daily. 04/12/23  [provider]  oxyCODONE  (OXY IR/ROXICODONE ) 5 MG immediate release tablet Take 1 tablet (5 mg total) by mouth every 6 (six) hours as needed for up to 4 doses for severe pain (pain score 7-10). 12/11/23   Christobal Guadalajara, MD  pantoprazole  (PROTONIX ) 40 MG tablet Take 1 tablet (40 mg total) by mouth daily. 03/26/23   Love, Sharlet RAMAN, PA-C  polyethylene glycol powder (MIRALAX ) 17 GM/SCOOP powder Take 17 g by mouth daily as needed for mild constipation. 10/10/23   Del Wilhelmena Lloyd Sola, FNP  senna-docusate (SENOKOT-S) 8.6-50 MG tablet Take 2  tablets by mouth daily after supper. 03/26/23   Love, Sharlet RAMAN, PA-C  sodium bicarbonate  650 MG tablet Take 1 tablet (650 mg total) by mouth 2 (two) times daily. 03/26/23   Love, Sharlet RAMAN, PA-C      Allergies    Shellfish allergy    Review of Systems   Review of Systems  Unable to perform ROS: Mental status change    Physical Exam Updated Vital Signs BP (!) 93/49   Pulse (!) 128   Temp 98.8 F (37.1 C) (Oral)   Resp (!) 7   Ht 1.778 m (5' 10)   Wt 86.2 kg   SpO2 96%   BMI 27.26 kg/m  Physical Exam Vitals and nursing note reviewed.  Constitutional:      General: He is not in acute distress.    Appearance: He is well-developed.  HENT:     Head: Normocephalic and atraumatic.     Mouth/Throat:     Mouth: Mucous membranes are dry.  Eyes:     Conjunctiva/sclera: Conjunctivae normal.  Cardiovascular:     Rate and Rhythm: Normal rate and regular rhythm.     Heart sounds: No murmur heard. Pulmonary:     Effort: Pulmonary effort is normal. No respiratory distress.     Breath sounds: Normal breath sounds.  Abdominal:     General: There is no distension.     Palpations: Abdomen is soft.     Tenderness: There is no abdominal tenderness. There is no guarding.  Musculoskeletal:        General: No swelling.     Cervical back: Neck supple.     Comments: Right above-the-knee amputation left leg with some edema.  Skin:    General: Skin is warm and dry.     Capillary Refill: Capillary refill takes less than 2 seconds.  Neurological:     Mental Status: He is alert.     Comments: Patient awake and will answer questions.  Denies any symptoms.  Psychiatric:        Mood and Affect: Mood normal.     ED Results / Procedures / Treatments   Labs (all labs ordered are listed, but only abnormal results are displayed) Labs Reviewed  CBC WITH DIFFERENTIAL/PLATELET - Abnormal; Notable for the following components:      Result Value   WBC 16.2 (*)    RBC 2.68 (*)    Hemoglobin 7.5 (*)     HCT 24.2 (*)    RDW 17.0 (*)    Neutro Abs 13.5 (*)    Abs Immature Granulocytes 0.09 (*)    All other components within normal limits  RESP PANEL BY RT-PCR (RSV, FLU A&B, COVID)  RVPGX2  CULTURE, BLOOD (ROUTINE X 2)  CULTURE, BLOOD (ROUTINE X 2)  LACTIC ACID, PLASMA  LACTIC ACID, PLASMA  COMPREHENSIVE METABOLIC PANEL  URINALYSIS, W/ REFLEX TO CULTURE (INFECTION SUSPECTED)  CK  PHOSPHORUS  PROTIME-INR  APTT    EKG None  Radiology No results found.  Procedures Procedures    Medications Ordered in ED Medications  lactated ringers  infusion (has no administration in time range)  lactated ringers  bolus 1,000 mL (has no administration in time range)    And  lactated ringers  bolus 1,000 mL (has no administration in time range)    And  lactated ringers  bolus 1,000 mL (has no administration in time range)  ceFEPIme  (MAXIPIME ) 2 g in sodium chloride  0.9 % 100 mL IVPB (has no administration in time range)  metroNIDAZOLE  (FLAGYL ) IVPB 500 mg (has no administration in time range)  vancomycin  (VANCOCIN ) IVPB 1000 mg/200 mL premix (has no administration in time range)    ED Course/ Medical Decision Making/ A&P                                 Medical Decision Making Amount and/or Complexity of Data Reviewed Labs: ordered. Radiology: ordered.  Risk Prescription drug management. Decision regarding hospitalization.   CRITICAL CARE Performed by: Ison Wichmann Total critical care time: 60 minutes Critical care time was exclusive of separately billable procedures and treating other patients. Critical care was necessary to treat or prevent imminent or life-threatening deterioration. Critical care was time spent personally by me on the following activities: development of treatment plan with patient and/or surrogate as well as nursing, discussions with consultants, evaluation of patient's response to treatment, examination of patient, obtaining history from patient or  surrogate, ordering and performing treatments and interventions, ordering and review of laboratory studies, ordering and review of radiographic studies, pulse oximetry and re-evaluation of patient's condition.  Patient's vital signs concerning for sepsis.  Could be similar presentation to what he had on December 2.  Will initiate sepsis protocol.  Start broad-spectrum antibiotics.  Patient's repeat lactic acid  elevated at 2 previous was 1.2.  Blood pressures did improve initially with the fluids but now they are little marginal.  Little bit similar to his lasted presentation and admission.  Patient was admitted December 2 through December 17.  He is still mentating very well.  Blood cultures have been sent and are pending CKs normal today Phos versus good at 4.6 he does have the metastatic cancer unknown primary INR 2.8 but patient is on Eliquis .  Had a complete metabolic panel significant for GFR 43 creatinine 1.67 BUN of 50.  Calcium  actually little bit on the low side at 7.8.  CBC does have a significant white count of 16 hemoglobin 7.5  Portable chest x-ray new right pleural effusion.  Patient was started on broad-spectrum antibiotics.  Will discuss with critical care based on some of the marginal blood pressures were getting could benefit may be from being started on some norepinephrine  but clinically he looks pretty good at this point in time.  Cussed with critical care.  They felt the patient could probably be admitted here.  His albumin  is low so they recommended 50 g of the concentrated albumin  recommend getting a cortisol level.  If his cortisol levels are less than 15 or if they were between 15 and 20 would recommend giving him hydrocortisone .  They also said we could just give him hydrocortisone .  Patient's most recent blood pressure with the fluids she is now at 100.  Discussed with hospitalist they will see and admit.  Final Clinical Impression(s) / ED Diagnoses Final diagnoses:  Altered  mental status,  unspecified altered mental status type  Hypotension, unspecified hypotension type  Sepsis, due to unspecified organism, unspecified whether acute organ dysfunction present [facility])    Rx / DC Orders ED Discharge Orders     None         Geraldene Hamilton, MD 12/27/23 1703    Geraldene Hamilton, MD 12/27/23 2118    Geraldene Hamilton, MD 12/27/23 2150

## 2023-12-27 NOTE — H&P (Signed)
 History and Physical    [name] FMW:[id number] DOB: 1951/01/06 DOA: 12/27/2023  PCP: Terry Wilhelmena Lloyd Hilario, FNP   Patient coming from:  [facility]    Chief Complaint:  Chief Complaint  Patient presents with   Altered Mental Status    HPI:  [name] is a 73 y.o. male with recent hospitalization at [facility] from 12/2 -17 when he was initially found down, encephalopathic; Treated for shock requiring vasopressors, hypovolemia, scrotal cellulitis, rhabdomylolysis, hypercalcemia of maligancy, acute DVT of RUE and started on [facility]. During his evaluation found to have poorly differentiated adenocarcinoma with unknown primary via Bx of soft tissue mass of abd, additional imaging with vertebral with possible epidural spread, diffuse bone, bilateral adrenal, and diffuse LAD and soft tissue / intramuscular  metastases. He was seen by Oncology and planned to have outpatient evaluation by Dr. Rogers (scheduled for 1/6) to see if a candidate for immunotherapy. His overall prognosis is poor and he elected for change to DNR/DNI last admission. Other medical hx notable for: Hypertension, hyperlipidemia, CKD 3, PAD with right AKA, prostate cancer and prostatectomy.   He was brought in from [facility] today reportedly due to decreased intake, altered mental status and found to have tachycardia and hypotension.  On interview he is confused but answering questions.  He is unable to describe the events leading up to his hospitalization he says it all started with a fall but then trails off.  He has no complaints.  Attempted to call [name] x 2 although straight to voicemail. Called staff at [facility]. Reported acute mental status change today. Baseline he is typically oriented and able to work complex tasks (eg navigate a smart phone and pull up his mychart). He is bedbound and total care at the nursing home. Staff have not noticed other symptoms recently, but has  decreased intake 25% in past 2 days. They were worried about sepsis with his VS changes there.   Review of Systems:  ROS complete and negative except as marked above   Allergies  Allergen Reactions   Shellfish Allergy Swelling   Sulfa Antibiotics Other (See Comments)    Listed in allergies on MAR from facility Unknown reaction    Prior to Admission medications   Medication Sig Start Date End Date Taking? Authorizing Provider  acetaminophen  (TYLENOL ) 325 MG tablet Take 1-2 tablets (325-650 mg total) by mouth every 4 (four) hours as needed for mild pain. Patient taking differently: Take 650 mg by mouth every 4 (four) hours as needed for mild pain (pain score 1-3). 03/26/23  Yes Love, Sharlet RAMAN, PA-C  albuterol  (VENTOLIN  HFA) 108 (90 Base) MCG/ACT inhaler Inhale 2 puffs into the lungs every 6 (six) hours as needed for wheezing or shortness of breath. 10/19/23  Yes Del Wilhelmena Lloyd, Iliana, FNP  AMINO ACIDS-PROTEIN HYDROLYS PO Take 30 mLs by mouth every morning. Prostat   Yes [provider]  apixaban  (ELIQUIS ) 5 MG TABS tablet Take 1 tablet (5 mg total) by mouth 2 (two) times daily. Patient taking differently: Take 5 mg by mouth 2 (two) times daily. 0900 and 2100 12/11/23  Yes Kc, Mennie, MD  ascorbic acid (VITAMIN C) 500 MG tablet Take 500 mg by mouth 2 (two) times daily.   Yes [provider]  ascorbic acid (VITAMIN C) 500 MG tablet Take 500 mg by mouth daily. With iron  supplement for iron  absorption   Yes [provider]  atorvastatin  (LIPITOR) 40  MG tablet Take 1 tablet (40 mg total) by mouth every morning. 04/05/23  Yes Sheree Penne Bruckner, MD  benzonatate  (TESSALON ) 200 MG capsule Take 1 capsule (200 mg total) by mouth 2 (two) times daily as needed for cough. 10/10/23  Yes Del Orbe Polanco, Hilario, FNP  cyclobenzaprine  (FLEXERIL ) 5 MG tablet Take 1 tablet (5 mg total) by mouth 3 (three) times daily as needed for muscle spasms. 04/25/23  Yes Lovorn, Megan, MD   ferrous sulfate  (FEROSUL) 325 (65 FE) MG tablet Take 325 mg by mouth daily with breakfast.   Yes [provider]  gabapentin  (NEURONTIN ) 100 MG capsule Take 2 capsules (200 mg total) by mouth 2 (two) times daily. Along with 300 mg nightly- for phantom pain- due to R AKA Patient taking differently: Take 200 mg by mouth 2 (two) times daily. For phantom pain 11/16/23  Yes Lovorn, Megan, MD  gabapentin  (NEURONTIN ) 300 MG capsule Take 300 mg by mouth at bedtime. For phantom pain   Yes [provider]  levocetirizine (XYZAL ) 5 MG tablet Take 1 tablet (5 mg total) by mouth every evening. 05/24/23  Yes Del Orbe Polanco, Iliana, FNP  melatonin 3 MG TABS tablet Take 1 tablet (3 mg total) by mouth at bedtime as needed. Patient taking differently: Take 3 mg by mouth at bedtime as needed (insomnia). 12/11/23  Yes Christobal Guadalajara, MD  Multiple Vitamins-Minerals (CENTRUM SILVER PO) Take 1 tablet by mouth daily.    Yes [provider]  oxybutynin  (DITROPAN -XL) 10 MG 24 hr tablet Take 10 mg by mouth daily. 04/12/23  Yes [provider]  oxyCODONE  (OXY IR/ROXICODONE ) 5 MG immediate release tablet Take 1 tablet (5 mg total) by mouth every 6 (six) hours as needed for up to 4 doses for severe pain (pain score 7-10). 12/11/23  Yes Christobal Guadalajara, MD  pantoprazole  (PROTONIX ) 20 MG tablet Take 20 mg by mouth daily.   Yes [provider]  polyethylene glycol powder (MIRALAX ) 17 GM/SCOOP powder Take 17 g by mouth daily as needed for mild constipation. 10/10/23  Yes Del Wilhelmena Falter, Hilario, FNP  senna-docusate (SENOKOT-S) 8.6-50 MG tablet Take 2 tablets by mouth daily after supper. 03/26/23  Yes Love, Sharlet RAMAN, PA-C  sodium bicarbonate  650 MG tablet Take 1 tablet (650 mg total) by mouth 2 (two) times daily. 03/26/23  Yes Love, Sharlet RAMAN, PA-C  Sodium Chloride  Flush (NORMAL SALINE FLUSH IV) Inject 60 mL/hr into the vein in the morning, at noon, and at bedtime. Via clysis at 60 ml/hr x 1 bag 12/26/23  12/28/23 Yes [provider]  zinc sulfate, 50mg  elemental zinc, 220 (50 Zn) MG capsule Take 220 mg by mouth daily.   Yes [provider]    Past Medical History:  Diagnosis Date   Family history of metabolic acidosis with increased anion gap 03/05/2023   GERD (gastroesophageal reflux disease)    History of kidney stones    Hx of AKA (above knee amputation), right (HCC)    Hyperlipidemia    Hypertension    Metastatic disease (HCC)    PAD (peripheral artery disease) (HCC)    S/p R CFA and PTA bypass 11/2022, critical limb ischemia s/p R AKA 02/2023   Prostate cancer [facility]) 2011   radiation june 2020   Septic shock (HCC) 11/26/2023   Umbilical hernia    Wears dentures    full   Wears glasses    Wears partial dentures    lower    Past Surgical History:  Procedure Laterality Date   ABDOMINAL AORTOGRAM W/LOWER EXTREMITY N/A 11/20/2022   Procedure: ABDOMINAL AORTOGRAM W/LOWER EXTREMITY;  Surgeon: Sheree Penne Bruckner, MD;  Location: [facility] INVASIVE CV LAB;  Service: Cardiovascular;  Laterality: N/A;   ABDOMINAL AORTOGRAM W/LOWER EXTREMITY N/A 03/05/2023   Procedure: ABDOMINAL AORTOGRAM W/LOWER EXTREMITY;  Surgeon: Sheree Penne Bruckner, MD;  Location: [facility] INVASIVE CV LAB;  Service: Cardiovascular;  Laterality: N/A;   AMPUTATION Right 03/09/2023   Procedure: AMPUTATION ABOVE KNEE;  Surgeon: Sheree Penne Bruckner, MD;  Location: [facility] OR;  Service: Vascular;  Laterality: Right;   COLONOSCOPY N/A 08/25/2020   Procedure: COLONOSCOPY;  Surgeon: Shaaron Lamar HERO, MD;  Location: [facility] ENDO SUITE;  Service: Endoscopy;  Laterality: N/A;  9:30   COLONOSCOPY     CYSTOSCOPY WITH LITHOLAPAXY N/A 05/18/2021   Procedure: CYSTOSCOPY WITH LITHOLAPAXY;  Surgeon: Devere Bruckner Righter, MD;  Location: [facility];  Service: Urology;  Laterality: N/A;  ONLY NEEDS  30 MIN   ESOPHAGOGASTRODUODENOSCOPY (EGD) WITH PROPOFOL  N/A 12/01/2023   Procedure: ESOPHAGOGASTRODUODENOSCOPY  (EGD) WITH PROPOFOL ;  Surgeon: Leigh Elspeth SQUIBB, MD;  Location: [facility] ENDOSCOPY;  Service: Gastroenterology;  Laterality: N/A;   FEMORAL-TIBIAL BYPASS GRAFT Right 12/12/2022   Procedure: RIGHT COMMON FEMORAL-POSTERIOR TIBIAL ARTERY BYPASS WITH VEIN HARVESTING OF THE GREATER SAPHENOUS VEIN AND FEMORAL ENDARTERECTOMY WITH COMPOSITE GRAFT;  Surgeon: Sheree Penne Bruckner, MD;  Location: [facility] OR;  Service: Vascular;  Laterality: Right;   IR US  GUIDE BX ASP/DRAIN  11/28/2023   POLYPECTOMY  08/25/2020   Procedure: POLYPECTOMY;  Surgeon: Shaaron Lamar HERO, MD;  Location: [facility];  Service: Endoscopy;;   ROBOT ASSISTED LAPAROSCOPIC RADICAL PROSTATECTOMY  2011   TONSILLECTOMY  age 37   and adenoids removed     reports that he quit smoking about 12 months ago. His smoking use included cigarettes. He started smoking about 22 years ago. He has a 5.3 pack-year smoking history. He has never been exposed to tobacco smoke. He has never used smokeless tobacco. He reports that he does not drink alcohol and does not use drugs.  Family History  Problem Relation Age of Onset   Gastric cancer Mother    Prostate cancer Father    Breast cancer Neg Hx    Pancreatic cancer Neg Hx      Physical Exam: Vitals:   12/27/23 2100 12/27/23 2130 12/27/23 2200 12/27/23 2215  BP: 100/61 (!) 95/44 (!) 91/40 (!) 86/60  Pulse: (!) 121 (!) 119 (!) 115 (!) 116  Resp: 19 (!) 22 (!) 25 16  Temp:      TempSrc:      SpO2: 100% 98% 98% 97%  Weight:      Height:        Gen: Awake, alert, acute and chronically ill-appearing CV: Regular, normal S1, S2, no murmurs, nonpalpable pedal pulse in the left foot distally is cool and poor cap refill, radial pulses 1+ bilaterally Resp: Normal WOB, on 2 L O2, clear anteriorly, not rolled for posterior exam Abd: Round, nondistended, hypoactive, nontender throughout.  There is a central nodular mass approximately 6 cm in subcu  GU: No residual scrotal cellulitis MSK: There is 1+  anasarca, asymmetric edema with right upper extremity 2+.  Right AKA. Skin: No rashes or lesions to exposed skin, not rolled to examine for pressure ulcers at this time Neuro: Alert and interactive, oriented to self, hospital (not to annie pen).  Not to time or situation.  CN II through XII are intact.  Motor is limited  by ability to follow commands, appears at least 4+ and symmetric in the upper extremities.  Does not follow command of the lower extremities Psych: Appears confused   Data review:   Labs reviewed, notable for:   Lactate 1.2 -> 2 NA 132 Bicarb 19, anion gap 13 Creatinine 1.6 (baseline 0.9)  BUN 50 Albumin  1.7 WBC 16 up from prior Hemoglobin 7.5 similar to previous Platelet 219 slightly down from prior  Micro:  Results for orders placed or performed during the hospital encounter of 12/27/23  Blood Culture (routine x 2)     Status: None (Preliminary result)   Collection Time: 12/27/23  5:17 PM   Specimen: Blood  Result Value Ref Range Status   Specimen Description BLOOD BLOOD RIGHT HAND  Final   Special Requests   Final    BOTTLES DRAWN AEROBIC ONLY Blood Culture results may not be optimal due to an inadequate volume of blood received in culture bottles Performed at [facility], [address] [place], [place] [address]    Culture PENDING  Incomplete   Report Status PENDING  Incomplete  Blood Culture (routine x 2)     Status: None (Preliminary result)   Collection Time: 12/27/23  5:17 PM   Specimen: Blood  Result Value Ref Range Status   Specimen Description BLOOD RIGHT SHOULDER  Final   Special Requests   Final    BOTTLES DRAWN AEROBIC ONLY Blood Culture results may not be optimal due to an inadequate volume of blood received in culture bottles Performed at [facility], [address], [place], [place] [address]    Culture PENDING  Incomplete   Report Status PENDING  Incomplete  Resp panel by RT-PCR (RSV, Flu A&B, Covid) Anterior Nasal Swab     Status: None    Collection Time: 12/27/23  8:43 PM   Specimen: Anterior Nasal Swab  Result Value Ref Range Status   SARS Coronavirus 2 by RT PCR NEGATIVE NEGATIVE Final    Comment: (NOTE) SARS-CoV-2 target nucleic acids are NOT DETECTED.  The SARS-CoV-2 RNA is generally detectable in upper respiratory specimens during the acute phase of infection. The lowest concentration of SARS-CoV-2 viral copies this assay can detect is 138 copies/mL. A negative result does not preclude SARS-Cov-2 infection and should not be used as the sole basis for treatment or other patient management decisions. A negative result may occur with  improper specimen collection/handling, submission of specimen other than nasopharyngeal swab, presence of viral mutation(s) within the areas targeted by this assay, and inadequate number of viral copies(<138 copies/mL). A negative result must be combined with clinical observations, patient history, and epidemiological information. The expected result is Negative.  Fact Sheet for Patients:  bloggercourse.com  Fact Sheet for Healthcare Providers:  seriousbroker.it  This test is no t yet approved or cleared by the [place]  FDA and  has been authorized for detection and/or diagnosis of SARS-CoV-2 by FDA under an Emergency Use Authorization (EUA). This EUA will remain  in effect (meaning this test can be used) for the duration of the COVID-19 declaration under Section 564(b)(1) of the Act, 21 U.S.C.section 360bbb-3(b)(1), unless the authorization is terminated  or revoked sooner.       Influenza A by PCR NEGATIVE NEGATIVE Final   Influenza B by PCR NEGATIVE NEGATIVE Final    Comment: (NOTE) The Xpert Xpress SARS-CoV-2/FLU/RSV plus assay is intended as an aid in the diagnosis of influenza from Nasopharyngeal swab specimens and should not be used as a sole  basis for treatment. Nasal washings and aspirates are unacceptable for  Xpert Xpress SARS-CoV-2/FLU/RSV testing.  Fact Sheet for Patients: bloggercourse.com  Fact Sheet for Healthcare Providers: seriousbroker.it  This test is not yet approved or cleared by the [place]  FDA and has been authorized for detection and/or diagnosis of SARS-CoV-2 by FDA under an Emergency Use Authorization (EUA). This EUA will remain in effect (meaning this test can be used) for the duration of the COVID-19 declaration under Section 564(b)(1) of the Act, 21 U.S.C. section 360bbb-3(b)(1), unless the authorization is terminated or revoked.     Resp Syncytial Virus by PCR NEGATIVE NEGATIVE Final    Comment: (NOTE) Fact Sheet for Patients: bloggercourse.com  Fact Sheet for Healthcare Providers: seriousbroker.it  This test is not yet approved or cleared by the [place]  FDA and has been authorized for detection and/or diagnosis of SARS-CoV-2 by FDA under an Emergency Use Authorization (EUA). This EUA will remain in effect (meaning this test can be used) for the duration of the COVID-19 declaration under Section 564(b)(1) of the Act, 21 U.S.C. section 360bbb-3(b)(1), unless the authorization is terminated or revoked.  Performed at [facility], [address] [place], [place] [address]     Imaging reviewed:  [facility] Chest [facility] 1 View Result Date: 12/27/2023 CLINICAL DATA:  Possible sepsis EXAM: PORTABLE CHEST 1 VIEW COMPARISON:  11/12/2023 FINDINGS: Check shadow is stable. Aortic calcifications are noted. Left lung is clear. Right-sided effusion is noted new from the prior exam. No bony abnormality is noted. IMPRESSION: New right pleural effusion. Electronically Signed   By: Oneil Devonshire M.D.   On: 12/27/2023 19:27    EKG:   Sinus tach, LAD, incomplete RBBB, no acute ischemic changes  ED Course:  Treated with 3 L IV fluid, started on maintenance at 150 cc/h, broad  antibiotics with vancomycin , cefepime , Flagyl .  PCCM was consulted with persistent hypotension recommending for trial of albumin , testing for adrenal insufficiency.   Assessment/Plan:  73 y.o. male with hx recent [facility] at [facility] 12[facility] -[facility] when he was initially found down, encephalopathic; Treated for shock requiring vasopressors, hypovolemia, scrotal cellulitis, rhabdomylolysis, hypercalcemia of maligancy, acute DVT of RUE and started on [facility]. During his evaluation found to have poorly differentiated adenocarcinoma with unknown primary with diffuse metastatic disease. Other medical hx notable for: Hypertension, hyperlipidemia, CKD 3, PAD with right AKA, prostate cancer and prostatectomy. Brought in from [place] reportedly due to decreased intake, altered mental status and VS changes. Found to have likely mixed shock, and AKI.   Shock, suspect mixed hypovolemic, and distributive (AI v septic) On presentation hypotensive to 80s over 40s, sinus tachycardia in the 110s to 120s.  Placed on O2 but never hypoxic. Afebrile. WBC 16, lactate rising to 2.  Flu/COVID/RSV negative.  Chest x-ray with new right pleural effusion but no infiltrates. With history decreased intake suspected hypovolemic however his blood pressure has marginally improved after 3 L IV fluid.  Otherwise known history of bilateral adrenal mets making adrenal insufficiency is strong possibility.  May have sepsis although no localizing symptoms, still waiting on urinalysis.  - Continue empiric broad-spectrum antibiotics with vancomycin , cefepime  2 g IV every 12 hours, metronidazole  500 mg p.o. every 12 hours.  Follow-up blood cultures - Follow-up urinalysis, changed to In-N-Out cath.  Check RVP - Status post 3 L IV fluid with overall worsening lactate, has anasarca which may be related to low albumin .  However due to no response to large-volume fluid resuscitation stop IV  fluids. - Trend lactate - Cosyntropin  stim test, after  cortisol checked start on empiric hydrocortisone  25 mg IV every 6 hours for presumed adrenal insufficiency - Start midodrine  10 mg 3 times daily - If persistent hypotension despite above will need initiation of vasopressors  Acute kidney injury stage I Background CKD 3 Baseline creatinine approximately 0.9, elevated to 1.6 on admission.  No urine output despite large-volume fluid resuscitation.  Suspect prerenal +/- urinary retention - Bladder scan then In-N-Out catheter UA - Check PVR if he can spontaneously void.  If requiring recurrent In-N-Out cath would place Foley catheter. - Status post large-volume IV fluid per above.  Holding fluids with worsening lactate trend - Continue home sodium bicarb 650 mg twice daily for now  Encephalopathy, acute Noted to have disorientation, impaired attention and memory.  Most likely delirium in the setting of underlying illness.  Likely multifactorial with hypoperfusion/shock, possible underlying infection, known malignancy, sedating meds (Flexeril , gabapentin , oxybutynin , oxycodone ). Reports fall + recently started on [facility], will r/o ICH  - CT head to rule out ICH - Management of shock/possible infection, malignancy per other problem list - Follow-up UA per above. - Hold Flexeril , gabapentin , oxybutynin . Reduce his home oxycodone  per below - Delirium precautions  Recently diagnosed metastatic poorly differentiated adenocarcinoma with unknown primary Mets to vertebral with possible epidural spread, diffuse bone, bilateral adrenal, and diffuse LAD and soft tissue / intramuscular areas. -Plan to have follow-up appointment with Dr. Rogers (scheduled for 1/6) to see if a candidate for immunotherapy.  -Currently with bedbound functional status, do not feel he would be a candidate for chemotherapy/immunotherapy at this time.  His prognosis is grim considering his other medical comorbidities.  Continue goals of care, hospice referral if here family is  interested.  -For malignancy related pain reduce his oxycodone  for now to 2.5 mg p.o. every 6 hours, can escalate as needed.  Reduction due to encephalopathy.  Goals of care - Currently DNR/DNI with out of hospital durable DNR - Hospice referral if he or family is interested  Deconditioning  -PT, OT evaluation.  Nutrition evaluation  Chronic medical problems: Recently diagnosed right upper extremity DVT: Continue home apixaban  History of hypertension, no longer an issue now with persistent hypotension: Starting on midodrine  per above.  Not on any antihypertensives. Hyperlipidemia: Continues on her atorvastatin  although may have limited utility with his overall prognosis consider discontinuation. PAD, right BKA, diminished left pedal pulses likely chronic ischemic changes developing in the left foot: Perfusion into the left foot may be further diminished by hypotension and medications including midodrine .  However feel needed due to his systemic hypoperfusion.  Chronic anemia, anemia of chronic disease: Continue home iron , trend Hb  GERD: Continue home pantoprazole  History of prostate cancer status post prostatectomy: Outpatient follow-up    Body mass index is 27.26 kg/m.    DVT prophylaxis:  Eliquis  Code Status:  DNR/DNI(Do NOT Intubate) Diet:  Diet Orders (From admission, onward)     Start     Ordered   12/27/23 2236  Diet regular Room service appropriate? Yes; Fluid consistency: Thin  Diet effective now       Comments: OK for diet if passes swallow screen  Question Answer Comment  Room service appropriate? Yes   Fluid consistency: Thin      12/27/23 2238           Family Communication:  No, unable to reach [facility]   Consults:  PCCM by EDP   Admission status:   Inpatient, Step  Down Unit  Severity of Illness: The appropriate patient status for this patient is INPATIENT. Inpatient status is judged to be reasonable and necessary in order to provide the required intensity of  service to ensure the patient's safety. The patient's presenting symptoms, physical exam findings, and initial radiographic and laboratory data in the context of their chronic comorbidities is felt to place them at high risk for further clinical deterioration. Furthermore, it is not anticipated that the patient will be medically stable for discharge from the hospital within 2 midnights of admission.   * I certify that at the point of admission it is my clinical judgment that the patient will require inpatient hospital care spanning beyond 2 midnights from the point of admission due to high intensity of service, high risk for further deterioration and high frequency of surveillance required.*   Dorn Dawson, MD [facility]  How to contact the [facility] Attending or Consulting provider 7A - 7P or covering provider during after hours 7P -7A, for this patient.  Check the care team in [facility] and look for a) attending/consulting [facility] provider listed and b) the [facility] team listed Log into [email or web address] and use [place]'s universal password to access. If you do not have the password, please contact the hospital operator. Locate the [facility] provider you are looking for under [facility] and page to a number that you can be directly reached. If you still have difficulty reaching the provider, please page the [facility] (Director on Call) for the Hospitalists listed on amion for assistance.  12/27/2023, 10:42 PM

## 2023-12-27 NOTE — Progress Notes (Signed)
 Pharmacy Antibiotic Note  [name] is a 73 y.o. male presented on 12/27/2023 with AMS with concern for sepsis.  Pharmacy has been consulted for vancomycin  dosing.  Plan: Vancomycin  1000mg  q24h (eAUC 422, Scr 1.67) F/u renal function, length of therapy and narrow as able Vancomycin  levels as needed  Height: 5' 10 (177.8 cm) Weight: 86.2 kg (190 lb) IBW/kg (Calculated) : 73  Temp (24hrs), Avg:98.7 F (37.1 C), Min:98.5 F (36.9 C), Max:98.8 F (37.1 C)  Recent Labs  Lab 12/27/23 1640 12/27/23 1923  WBC 16.2*  --   CREATININE 1.67*  --   LATICACIDVEN 1.2 2.0*    Estimated Creatinine Clearance: 40.7 mL/min (A) (by C-G formula based on SCr of 1.67 mg/dL (H)).    Allergies  Allergen Reactions   Shellfish Allergy Swelling   Sulfa Antibiotics Other (See Comments)    Listed in allergies on MAR from facility Unknown reaction    Antimicrobials this admission: Cefepime  1/2 > Flagyl  1/2 > Vancomycin  1/2 >  Microbiology results: 1/2 Bcx: IP  1/2 resp panel: IP 1/2 RSV, COVID, FLU: negative    Thank you for allowing pharmacy to be a part of this patient's care.  [name] Bash 12/27/2023 10:52 PM

## 2023-12-27 NOTE — Progress Notes (Signed)
Elink following sepsis bundle. °

## 2023-12-27 NOTE — ED Triage Notes (Signed)
 Pt from [facility].  Staff stated pt has had decreased appetite, increased weakness, and confusion.  Pt hypotensive and tachycardic with EMS.   EMS stated facility has covid and flu positive pts

## 2023-12-28 ENCOUNTER — Encounter (HOSPITAL_COMMUNITY): Payer: Self-pay

## 2023-12-28 DIAGNOSIS — R579 Shock, unspecified: Secondary | ICD-10-CM

## 2023-12-28 LAB — URINALYSIS, W/ REFLEX TO CULTURE (INFECTION SUSPECTED)
Bilirubin Urine: NEGATIVE
Glucose, UA: NEGATIVE mg/dL
Ketones, ur: 5 mg/dL — AB
Nitrite: NEGATIVE
Protein, ur: 30 mg/dL — AB
Specific Gravity, Urine: 1.013 (ref 1.005–1.030)
pH: 5 (ref 5.0–8.0)

## 2023-12-28 LAB — CORTISOL
Cortisol, Plasma: 27.7 ug/dL
Cortisol, Plasma: 42.7 ug/dL

## 2023-12-28 LAB — BASIC METABOLIC PANEL
Anion gap: 12 (ref 5–15)
BUN: 44 mg/dL — ABNORMAL HIGH (ref 8–23)
CO2: 18 mmol/L — ABNORMAL LOW (ref 22–32)
Calcium: 7.2 mg/dL — ABNORMAL LOW (ref 8.9–10.3)
Chloride: 102 mmol/L (ref 98–111)
Creatinine, Ser: 1.34 mg/dL — ABNORMAL HIGH (ref 0.61–1.24)
GFR, Estimated: 56 mL/min — ABNORMAL LOW (ref >60)
Glucose, Bld: 105 mg/dL — ABNORMAL HIGH (ref 70–99)
Potassium: 4.2 mmol/L (ref 3.5–5.1)
Sodium: 132 mmol/L — ABNORMAL LOW (ref 135–145)

## 2023-12-28 LAB — MAGNESIUM: Magnesium: 1.9 mg/dL (ref 1.7–2.4)

## 2023-12-28 LAB — CBC
HCT: 16.9 % — ABNORMAL LOW (ref 39.0–52.0)
Hemoglobin: 5.2 g/dL — CL (ref 13.0–17.0)
MCH: 28.3 pg (ref 26.0–34.0)
MCHC: 30.8 g/dL (ref 30.0–36.0)
MCV: 91.8 fL (ref 80.0–100.0)
Platelets: 155 10*3/uL (ref 150–400)
RBC: 1.84 MIL/uL — ABNORMAL LOW (ref 4.22–5.81)
RDW: 16.7 % — ABNORMAL HIGH (ref 11.5–15.5)
WBC: 12.9 10*3/uL — ABNORMAL HIGH (ref 4.0–10.5)
nRBC: 0 % (ref 0.0–0.2)

## 2023-12-28 LAB — PHOSPHORUS: Phosphorus: 3.6 mg/dL (ref 2.5–4.6)

## 2023-12-28 LAB — PREPARE RBC (CROSSMATCH)

## 2023-12-28 MED ORDER — ATORVASTATIN CALCIUM 40 MG PO TABS
40.0000 mg | ORAL_TABLET | Freq: Every day | ORAL | Status: DC
  Administered 2023-12-28 – 2023-12-29 (×2): 40 mg via ORAL
  Filled 2023-12-28 (×2): qty 1

## 2023-12-28 MED ORDER — ENSURE ENLIVE PO LIQD
237.0000 mL | Freq: Two times a day (BID) | ORAL | Status: DC
  Administered 2023-12-29 – 2024-01-01 (×8): 237 mL via ORAL
  Filled 2023-12-28 (×3): qty 237

## 2023-12-28 MED ORDER — SODIUM CHLORIDE 0.9% IV SOLUTION: Freq: Once | INTRAVENOUS | Status: AC

## 2023-12-28 MED ORDER — CHLORHEXIDINE GLUCONATE CLOTH 2 % EX PADS
6.0000 | MEDICATED_PAD | Freq: Every day | CUTANEOUS | Status: DC
  Administered 2023-12-28 – 2023-12-29 (×2): 6 via TOPICAL

## 2023-12-28 MED ORDER — HYDROCORTISONE SOD SUC (PF) 100 MG IJ SOLR
100.0000 mg | Freq: Two times a day (BID) | INTRAMUSCULAR | Status: DC
  Administered 2023-12-28 – 2023-12-30 (×5): 100 mg via INTRAVENOUS
  Filled 2023-12-28 (×5): qty 2

## 2023-12-28 MED ORDER — PANTOPRAZOLE SODIUM 40 MG PO TBEC
40.0000 mg | DELAYED_RELEASE_TABLET | Freq: Every day | ORAL | Status: DC
  Administered 2023-12-28 – 2024-01-01 (×5): 40 mg via ORAL
  Filled 2023-12-28 (×5): qty 1

## 2023-12-28 MED ORDER — HYDROCORTISONE SOD SUC (PF) 100 MG IJ SOLR
100.0000 mg | Freq: Once | INTRAMUSCULAR | Status: AC
  Administered 2023-12-28: 100 mg via INTRAVENOUS
  Filled 2023-12-28: qty 2

## 2023-12-28 MED ORDER — APIXABAN 5 MG PO TABS: 5.0000 mg | ORAL_TABLET | Freq: Two times a day (BID) | ORAL | Status: DC

## 2023-12-28 NOTE — Progress Notes (Signed)
 OT Cancellation Note  Patient Details Name: BINNIE VONDERHAAR MRN: [id number] DOB: 1951/01/30   Cancelled Treatment:    Reason Eval/Treat Not Completed: Medical issues which prohibited therapy. Hold due to low hemoglobin. Will attempt evaluation when pt is more medically appropriate and as time permits.   Lennon Boutwell OT, MOT   Jayson Person 12/28/2023, 10:08 AM

## 2023-12-28 NOTE — Plan of Care (Signed)
  Problem: Clinical Measurements: Goal: Ability to maintain clinical measurements within normal limits will improve Outcome: Progressing Goal: Respiratory complications will improve Outcome: Progressing   Problem: Nutrition: Goal: Adequate nutrition will be maintained Outcome: Progressing   Problem: Pain Management: Goal: General experience of comfort will improve Outcome: Progressing   Problem: Safety: Goal: Ability to remain free from injury will improve Outcome: Progressing

## 2023-12-28 NOTE — Plan of Care (Signed)

## 2023-12-28 NOTE — Progress Notes (Signed)
 Initial Nutrition Assessment  DOCUMENTATION CODES:   Not applicable  INTERVENTION:   Ensure Enlive po BID, each supplement provides 350 kcal and 20 grams of protein.  NUTRITION DIAGNOSIS:   Increased nutrient needs related to cancer and cancer related treatments as evidenced by estimated needs.  GOAL:   Patient will meet greater than or equal to 90% of their needs  MONITOR:   PO intake  REASON FOR ASSESSMENT:   Consult Assessment of nutrition requirement/status  ASSESSMENT:   73 yo male admitted with shock, AKI, acute encephalopathy. PMH includes recently diagnosed metastatic adenocarcinoma with unknown primary, R AKA, HTN, HLD, GERD, PAD, prostate cancer.  RD working remotely. Unable to speak with patient or complete NFPE. He remains in the ED at this time.    TOC has made referral to residential hospice per family request.   Labs reviewed. Na 132  Medications reviewed and include ferrous sulfate , solu-cortef , protonix , senokot-s, sodium bicarbonate .  Weight history reviewed. Patient has had 14% weight loss over the past 3 months. Suspect he is malnourished. Unable to obtain enough information at this time for identification of malnutrition.   NUTRITION - FOCUSED PHYSICAL EXAM:  Unable to complete  Diet Order:   Diet Order             Diet regular Room service appropriate? Yes; Fluid consistency: Thin  Diet effective now                   EDUCATION NEEDS:   No education needs have been identified at this time  Skin:  Skin Assessment: Reviewed RN Assessment  Last BM:  unknown  Height:   Ht Readings from Last 1 Encounters:  12/27/23 5' 10 (1.778 m)    Weight:   Wt Readings from Last 1 Encounters:  12/27/23 86.2 kg    BMI:  Body mass index is 27.26 kg/m.  Estimated Nutritional Needs:   Kcal:  2200-2400  Protein:  115-130 gm  Fluid:  2.2-2.4 L   Suzen HUNT RD, LDN, CNSC Contact Inpatient RD using group chat RD Inpatient via  Secure Chat in EPIC

## 2023-12-28 NOTE — TOC Initial Note (Addendum)
 Transition of Care [facility]) - Initial/Assessment Note    Patient Details  Name: [name] MRN: [id number] Date of Birth: 1951/10/14  [facility] CM/SW Contact:    Lucie Lunger, LCSWA Phone Number: 12/28/2023, 11:17 AM  Clinical Narrative:                 Pt is high risk for readmission. CSW noted per chart review that pt arrived to hospital from [facility]. CSW spoke to [place] with facility who states that pt is a SNF resident. Pt will need a new SNF auth prior to return. TOC to follow.   Addendum: CSW updated by MD that pts sister requests residential hospice referral be made to [facility]. CSW made referral, [facility] will send an RN out to assess pt.  Expected Discharge Plan: Skilled Nursing Facility Barriers to Discharge: Continued Medical Work up   Patient Goals and CMS Choice Patient states their goals for this hospitalization and ongoing recovery are:: get better CMS Medicare.gov Compare Post Acute Care list provided to:: Patient Choice offered to / list presented to : Patient      Expected Discharge Plan and Services In-house Referral: Clinical Social Work Discharge Planning Services: CM Consult Post Acute Care Choice: Skilled Nursing Facility Living arrangements for the past 2 months: Single Family Home                                      Prior Living Arrangements/Services Living arrangements for the past 2 months: Single Family Home Lives with:: Self Patient language and need for interpreter reviewed:: Yes Do you feel safe going back to the place where you live?: Yes      Need for Family Participation in Patient Care: Yes (Comment) Care giver support system in place?: Yes (comment) Current home services: DME Criminal Activity/Legal Involvement Pertinent to Current Situation/Hospitalization: No - Comment as needed  Activities of Daily Living      Permission Sought/Granted                  Emotional Assessment Appearance::  Appears stated age       Alcohol / Substance Use: Not Applicable Psych Involvement: No (comment)  Admission diagnosis:  Shock (HCC) [R57.9] Patient Active Problem List   Diagnosis Date Noted   Shock (HCC) 12/27/2023   Metastatic adenocarcinoma (HCC) 11/30/2023   Malnutrition of moderate degree 11/29/2023   Hypovolemic shock (HCC) 11/27/2023   Rhabdomyolysis 11/27/2023   Pressure injury of skin 11/27/2023   AKI (acute kidney injury) (HCC) 11/27/2023   DVT (deep venous thrombosis) (HCC) 11/27/2023   GERD (gastroesophageal reflux disease) 11/27/2023   Transaminitis 11/27/2023   Sepsis (HCC) 11/26/2023   Anemia 10/19/2023   Abdominal pain 10/10/2023   Cough 10/10/2023   Abnormal gait 07/27/2023   Hypertension 07/10/2023   Blister of left leg 05/24/2023   Acute blood loss anemia 03/27/2023   Phantom pain after amputation of lower extremity (HCC) 03/27/2023   Adjustment disorder with mixed anxiety and depressed mood 03/15/2023   Above knee amputation of right lower extremity (HCC) 03/13/2023   CKD (chronic kidney disease) stage 3, GFR 30-59 ml/min (HCC) 03/13/2023   Cellulitis of right lower extremity 03/09/2023   Fever 03/09/2023   Non-traumatic rhabdomyolysis 03/09/2023   Bandemia 03/09/2023   Acute kidney injury superimposed on chronic kidney disease (HCC) 03/05/2023   Hyperkalemia 03/05/2023   Normocytic anemia 03/05/2023  History of prostate cancer 03/05/2023   Essential hypertension 03/05/2023   Metabolic acidosis, increased anion gap 03/05/2023   Critical limb ischemia of right lower extremity (HCC) 12/12/2022   Malignant neoplasm of prostate (HCC) 03/18/2019   PCP:  Terry Wilhelmena Lloyd Hilario, FNP Pharmacy:   [facility] [address] [place] - [address] [place] [address] Phone: [phone] Fax: [phone]  [facility] [address] [place] [address] Phone: [phone] Fax:  [phone]     Social Drivers of Health (SDOH) Social History: SDOH Screenings   Food Insecurity: Patient Unable To Answer (11/26/2023)  Recent Concern: Food Insecurity - Food Insecurity Present (10/09/2023)  Housing: Patient Unable To Answer (10/09/2023)  Transportation Needs: No Transportation Needs (10/09/2023)  Utilities: Patient Unable To Answer (11/26/2023)  Depression (PHQ2-9): Low Risk  (10/10/2023)  Financial Resource Strain: Medium Risk (10/09/2023)  Physical Activity: Unknown (10/09/2023)  Social Connections: Moderately Integrated (10/09/2023)  Stress: No Stress Concern Present (10/09/2023)  Tobacco Use: Medium Risk (12/27/2023)   SDOH Interventions:     Readmission Risk Interventions    12/28/2023   11:16 AM  Readmission Risk Prevention Plan  Transportation Screening Complete  Medication Review (RN Care Manager) Complete  HRI or Home Care Consult Complete  SW Recovery Care/Counseling Consult Complete  Palliative Care Screening Complete  Skilled Nursing Facility Complete

## 2023-12-28 NOTE — Evaluation (Signed)
 Physical Therapy Evaluation Patient Details Name: [name] MRN: [id number] DOB: 1951-10-29 Today's Date: 12/28/2023  History of Present Illness  [name] is a 73 y.o. male with recent hospitalization at [facility] from 12/2 -17 when he was initially found down, encephalopathic; Treated for shock requiring vasopressors, hypovolemia, scrotal cellulitis, rhabdomylolysis, hypercalcemia of maligancy, acute DVT of RUE and started on [facility]. During his evaluation found to have poorly differentiated adenocarcinoma with unknown primary via Bx of soft tissue mass of abd, additional imaging with vertebral with possible epidural spread, diffuse bone, bilateral adrenal, and diffuse LAD and soft tissue / intramuscular  metastases. He was seen by Oncology and planned to have outpatient evaluation by Dr. Rogers (scheduled for 1/6) to see if a candidate for immunotherapy. His overall prognosis is poor and he elected for change to DNR/DNI last admission. Other medical hx notable for: Hypertension, hyperlipidemia, CKD 3, PAD with right AKA, prostate cancer and prostatectomy.   Clinical Impression  Patient requires total assist for long sitting in bed, unable to move BLE against gravity and required Total assist for repositioning when put back to bed.  Patient discharged from physical therapy to care of nursing for rolling side to side every 2 hours and OOB using mechanical lift as tolerated for length of stay.      If plan is discharge home, recommend the following: A lot of help with walking and/or transfers;A lot of help with bathing/dressing/bathroom   Can travel by private vehicle        Equipment Recommendations None recommended by PT  Recommendations for Other Services       Functional Status Assessment Patient has had a recent decline in their functional status and/or demonstrates limited ability to make significant improvements in function in a reasonable and predictable amount of time      Precautions / Restrictions Precautions Precautions: Fall Restrictions Weight Bearing Restrictions Per Provider Order: No      Mobility  Bed Mobility Overal bed mobility: Needs Assistance Bed Mobility: Rolling, Supine to Sit, Sit to Supine Rolling: Total assist   Supine to sit: Total assist Sit to supine: Total assist        Transfers                        Ambulation/Gait                  Stairs            Wheelchair Mobility     Tilt Bed    Modified Rankin (Stroke Patients Only)       Balance Overall balance assessment: Needs assistance Sitting-balance support: Feet unsupported, Bilateral upper extremity supported Sitting balance-Leahy Scale: Poor Sitting balance - Comments: long sitting in bed                                     Pertinent Vitals/Pain Pain Assessment Pain Assessment: No/denies pain    Home Living Family/patient expects to be discharged to:: Skilled nursing facility Living Arrangements: Other (Comment) (SNF staff) Available Help at Discharge: Other (Comment) (SNF staff)                    Prior Function Prior Level of Function : Needs assist       Physical Assist : Mobility (physical) Mobility (physical): Bed mobility;Transfers;Gait;Stairs   Mobility Comments: Total assist  Extremity/Trunk Assessment   Upper Extremity Assessment Upper Extremity Assessment: Defer to OT evaluation    Lower Extremity Assessment Lower Extremity Assessment: Generalized weakness    Cervical / Trunk Assessment Cervical / Trunk Assessment: Kyphotic  Communication   Communication Communication: No apparent difficulties Cueing Techniques: Verbal cues;Tactile cues  Cognition Arousal: Alert Behavior During Therapy: Anxious Overall Cognitive Status: No family/caregiver present to determine baseline cognitive functioning                                          General  Comments      Exercises     Assessment/Plan    PT Assessment All further PT needs can be met in the next venue of care  PT Problem List Decreased strength;Decreased activity tolerance;Decreased balance;Decreased mobility       PT Treatment Interventions      PT Goals (Current goals can be found in the Care Plan section)  Acute Rehab PT Goals Patient Stated Goal: return home PT Goal Formulation: With patient Time For Goal Achievement: 12/28/23 Potential to Achieve Goals: Good    Frequency       Co-evaluation               AM-PAC PT 6 Clicks Mobility  Outcome Measure Help needed turning from your back to your side while in a flat bed without using bedrails?: Total Help needed moving from lying on your back to sitting on the side of a flat bed without using bedrails?: Total Help needed moving to and from a bed to a chair (including a wheelchair)?: Total Help needed standing up from a chair using your arms (e.g., wheelchair or bedside chair)?: Total Help needed to walk in hospital room?: Total Help needed climbing 3-5 steps with a railing? : Total 6 Click Score: 6    End of Session   Activity Tolerance: Patient limited by fatigue;Patient tolerated treatment well Patient left: in bed;with call bell/phone within reach Nurse Communication: Mobility status PT Visit Diagnosis: Unsteadiness on feet (R26.81);Other abnormalities of gait and mobility (R26.89);Muscle weakness (generalized) (M62.81)    Time: 8496-8481 PT Time Calculation (min) (ACUTE ONLY): 15 min   Charges:   PT Evaluation $PT Eval Low Complexity: 1 Low PT Treatments $Therapeutic Activity: 8-22 mins PT General Charges $$ ACUTE PT VISIT: 1 Visit         3:43 PM, 12/28/23 Lynwood Music, MPT Physical Therapist with [facility] [phone] office [phone] mobile phone

## 2023-12-28 NOTE — Progress Notes (Addendum)
 PROGRESS NOTE    [name]  FMW:[id number] DOB: Dec 07, 1951 DOA: 12/27/2023 PCP: Terry Wilhelmena Lloyd Hilario, FNP   Brief Narrative:    [name] is a 73 y.o. male with recent hospitalization at [facility] from 12/2 -17 when he was initially found down, encephalopathic; Treated for shock requiring vasopressors, hypovolemia, scrotal cellulitis, rhabdomylolysis, hypercalcemia of maligancy, acute DVT of RUE and started on [facility]. During his evaluation found to have poorly differentiated adenocarcinoma with unknown primary via Bx of soft tissue mass of abd, additional imaging with vertebral with possible epidural spread, diffuse bone, bilateral adrenal, and diffuse LAD and soft tissue / intramuscular  metastases. He was seen by Oncology and planned to have outpatient evaluation by Dr. Rogers (scheduled for 1/6) to see if a candidate for immunotherapy. His overall prognosis is poor and he elected for change to DNR/DNI last admission. Other medical hx notable for: Hypertension, hyperlipidemia, CKD 3, PAD with right AKA, prostate cancer and prostatectomy.    He was brought in from [facility] today reportedly due to decreased intake, altered mental status and found to have tachycardia and hypotension.  His blood pressures have improved with PRBC transfusion.  Oncology recommends palliative consultation and hospice placement.  Assessment & Plan:   Principal Problem:   Shock (HCC)  Assessment and Plan:  Shock, suspect mixed hypovolemic, and distributive (AI v septic) On presentation hypotensive to 80s over 40s, sinus tachycardia in the 110s to 120s.  Placed on O2 but never hypoxic. Afebrile. WBC 16, lactate rising to 2.  Flu/COVID/RSV negative.  Chest x-ray with new right pleural effusion but no infiltrates. With history decreased intake suspected hypovolemic however his blood pressure has marginally improved after 3 L IV fluid.  Otherwise known history of bilateral adrenal mets making  adrenal insufficiency is strong possibility.  May have sepsis although no localizing symptoms, still waiting on urinalysis.  - Continue empiric broad-spectrum antibiotics with vancomycin , cefepime  2 g IV every 12 hours, metronidazole  500 mg p.o. every 12 hours.  Follow-up blood cultures - UA suggestive of UTI - Status post 3 L IV fluid with overall worsening lactate, has anasarca which may be related to low albumin .  However due to no response to large-volume fluid resuscitation stop IV fluids. - Trend lactate - Cosyntropin  stim test, after cortisol checked start on empiric hydrocortisone  25 mg IV every 6 hours for presumed adrenal insufficiency - Start midodrine  10 mg 3 times daily - If persistent hypotension despite above will need initiation of vasopressors   Acute kidney injury stage I Background CKD 3 Baseline creatinine approximately 0.9, elevated to 1.6 on admission.  No urine output despite large-volume fluid resuscitation.  Suspect prerenal +/- urinary retention - Bladder scan then In-N-Out catheter UA - Check PVR if he can spontaneously void.  If requiring recurrent In-N-Out cath would place Foley catheter. - Status post large-volume IV fluid per above.  Holding fluids with worsening lactate trend - Continue home sodium bicarb 650 mg twice daily for now   Encephalopathy, acute possibly related to UTI Noted to have disorientation, impaired attention and memory.  Most likely delirium in the setting of underlying illness.  Likely multifactorial with hypoperfusion/shock, possible underlying infection, known malignancy, sedating meds (Flexeril , gabapentin , oxybutynin , oxycodone ). Reports fall + recently started on [facility], will r/o ICH  - CT head negative for ICH - Management of shock/possible infection, malignancy per other problem list - UA with possible UTI - Hold Flexeril , gabapentin , oxybutynin . Reduce his home oxycodone  per  below - Delirium precautions   Recently diagnosed metastatic  poorly differentiated adenocarcinoma with unknown primary Mets to vertebral with possible epidural spread, diffuse bone, bilateral adrenal, and diffuse LAD and soft tissue / intramuscular areas. - Discussed case with Dr. Katragadda and patient is not a candidate for any form of therapy at this time.  Palliative consulted and recommend hospice care.   Goals of care - Currently DNR/DNI with out of hospital durable DNR - Hospice referral approved by sister   Deconditioning  -PT, OT evaluation.  Nutrition evaluation   Chronic medical problems: Recently diagnosed right upper extremity DVT: Continue home apixaban  History of hypertension, no longer an issue now with persistent hypotension: Starting on midodrine  per above.  Not on any antihypertensives. Hyperlipidemia: Continues on her atorvastatin  although may have limited utility with his overall prognosis consider discontinuation. PAD, right BKA, diminished left pedal pulses likely chronic ischemic changes developing in the left foot: Perfusion into the left foot may be further diminished by hypotension and medications including midodrine .  However feel needed due to his systemic hypoperfusion.  Chronic anemia, anemia of chronic disease: Continue home iron , trend Hb  GERD: Continue home pantoprazole  History of prostate cancer status post prostatectomy: Outpatient follow-up   DVT prophylaxis:apixaban  Code Status: DNR Family Communication: Discussed with sister on phone 12/28/2023 who is agreeable to hospice placement Disposition Plan:  Status is: Inpatient Remains inpatient appropriate because: Need for IV medications.   Consultants:  Oncology-discussed with Dr. Rogers Palliative  Procedures:  None  Antimicrobials:  Anti-infectives (From admission, onward)    Start     Dose/Rate Route Frequency Ordered Stop   12/28/23 0500  ceFEPIme  (MAXIPIME ) 2 g in sodium chloride  0.9 % 100 mL IVPB        2 g 200 mL/hr over 30 Minutes  Intravenous Every 12 hours 12/27/23 2238     12/28/23 0500  metroNIDAZOLE  (FLAGYL ) tablet 500 mg        500 mg Oral Every 12 hours 12/27/23 2238     12/27/23 2327  vancomycin  (VANCOCIN ) IVPB 1000 mg/200 mL premix        1,000 mg 200 mL/hr over 60 Minutes Intravenous Every 24 hours 12/27/23 2328     12/27/23 1700  ceFEPIme  (MAXIPIME ) 2 g in sodium chloride  0.9 % 100 mL IVPB        2 g 200 mL/hr over 30 Minutes Intravenous  Once 12/27/23 1648 12/27/23 1757   12/27/23 1700  metroNIDAZOLE  (FLAGYL ) IVPB 500 mg        500 mg 100 mL/hr over 60 Minutes Intravenous  Once 12/27/23 1648 12/27/23 1854   12/27/23 1700  vancomycin  (VANCOCIN ) IVPB 1000 mg/200 mL premix  Status:  Discontinued        1,000 mg 200 mL/hr over 60 Minutes Intravenous  Once 12/27/23 1648 12/27/23 1651   12/27/23 1700  vancomycin  (VANCOCIN ) IVPB 1000 mg/200 mL premix        1,000 mg 200 mL/hr over 60 Minutes Intravenous Every 1 hr x 2 12/27/23 1651 12/27/23 1955      Subjective: Patient seen and evaluated today with no new acute complaints or concerns. No acute concerns or events noted overnight.  His blood pressures continue to remain soft.  Objective: Vitals:   12/28/23 0723 12/28/23 0735 12/28/23 0745 12/28/23 0800  BP: (!) 98/54 (!) 102/56 91/66 (!) 99/54  Pulse: (!) 101 (!) 103 (!) 103 99  Resp: (!) 22 13 18 16   Temp: 98 F (36.7 C) 97.7 F (  36.5 C)    TempSrc:  Oral    SpO2: 96%  97% 97%  Weight:      Height:        Intake/Output Summary (Last 24 hours) at 12/28/2023 0823 Last data filed at 12/28/2023 0616 Gross per 24 hour  Intake 2972.5 ml  Output 375 ml  Net 2597.5 ml   Filed Weights   12/27/23 1620  Weight: 86.2 kg    Examination:  General exam: Appears calm and comfortable  Respiratory system: Clear to auscultation. Respiratory effort normal. Cardiovascular system: S1 & S2 heard, RRR.  Gastrointestinal system: Abdomen is soft Central nervous system: Alert and awake Extremities: No edema,  right BKA Skin: No significant lesions noted Psychiatry: Flat affect.    Data Reviewed: I have personally reviewed following labs and imaging studies  CBC: Recent Labs  Lab 12/27/23 1640 12/28/23 0311  WBC 16.2* 12.9*  NEUTROABS 13.5*  --   HGB 7.5* 5.2*  HCT 24.2* 16.9*  MCV 90.3 91.8  PLT 219 155   Basic Metabolic Panel: Recent Labs  Lab 12/27/23 1640 12/27/23 1717 12/28/23 0311  NA 132*  --  132*  K 4.7  --  4.2  CL 100  --  102  CO2 19*  --  18*  GLUCOSE 80  --  105*  BUN 50*  --  44*  CREATININE 1.67*  --  1.34*  CALCIUM  7.8*  --  7.2*  MG  --   --  1.9  PHOS  --  4.6 3.6   GFR: Estimated Creatinine Clearance: 50.7 mL/min (A) (by C-G formula based on SCr of 1.34 mg/dL (H)). Liver Function Tests: Recent Labs  Lab 12/27/23 1640  AST 56*  ALT 33  ALKPHOS 132*  BILITOT 0.8  PROT 6.6  ALBUMIN  1.7*   No results for input(s): LIPASE, AMYLASE in the last 168 hours. No results for input(s): AMMONIA in the last 168 hours. Coagulation Profile: Recent Labs  Lab 12/27/23 1717  INR 2.8*   Cardiac Enzymes: Recent Labs  Lab 12/27/23 1717  CKTOTAL 221   BNP (last 3 results) No results for input(s): PROBNP in the last 8760 hours. HbA1C: No results for input(s): HGBA1C in the last 72 hours. CBG: No results for input(s): GLUCAP in the last 168 hours. Lipid Profile: No results for input(s): CHOL, HDL, LDLCALC, TRIG, CHOLHDL, LDLDIRECT in the last 72 hours. Thyroid  Function Tests: Recent Labs    12/27/23 1640  TSH 1.371   Anemia Panel: No results for input(s): VITAMINB12, FOLATE, FERRITIN, TIBC, IRON , RETICCTPCT in the last 72 hours. Sepsis Labs: Recent Labs  Lab 12/27/23 1640 12/27/23 1923  LATICACIDVEN 1.2 2.0*    Recent Results (from the past 240 hours)  Blood Culture (routine x 2)     Status: None (Preliminary result)   Collection Time: 12/27/23  5:17 PM   Specimen: BLOOD  Result Value Ref Range Status    Specimen Description BLOOD BLOOD RIGHT HAND  Final   Special Requests   Final    BOTTLES DRAWN AEROBIC ONLY Blood Culture results may not be optimal due to an inadequate volume of blood received in culture bottles   Culture   Final    NO GROWTH < 12 HOURS Performed at [facility], [address] [place], [place] [address]    Report Status PENDING  Incomplete  Blood Culture (routine x 2)     Status: None (Preliminary result)   Collection Time: 12/27/23  5:17 PM   Specimen:  BLOOD  Result Value Ref Range Status   Specimen Description BLOOD RIGHT SHOULDER  Final   Special Requests   Final    BOTTLES DRAWN AEROBIC ONLY Blood Culture results may not be optimal due to an inadequate volume of blood received in culture bottles   Culture   Final    NO GROWTH < 12 HOURS Performed at [facility], [address] [place], [place] [address]    Report Status PENDING  Incomplete  Resp panel by RT-PCR (RSV, Flu A&B, Covid) Anterior Nasal Swab     Status: None   Collection Time: 12/27/23  8:43 PM   Specimen: Anterior Nasal Swab  Result Value Ref Range Status   SARS Coronavirus 2 by RT PCR NEGATIVE NEGATIVE Final    Comment: (NOTE) SARS-CoV-2 target nucleic acids are NOT DETECTED.  The SARS-CoV-2 RNA is generally detectable in upper respiratory specimens during the acute phase of infection. The lowest concentration of SARS-CoV-2 viral copies this assay can detect is 138 copies/mL. A negative result does not preclude SARS-Cov-2 infection and should not be used as the sole basis for treatment or other patient management decisions. A negative result may occur with  improper specimen collection/handling, submission of specimen other than nasopharyngeal swab, presence of viral mutation(s) within the areas targeted by this assay, and inadequate number of viral copies(<138 copies/mL). A negative result must be combined with clinical observations, patient history, and epidemiological information. The  expected result is Negative.  Fact Sheet for Patients:  bloggercourse.com  Fact Sheet for Healthcare Providers:  seriousbroker.it  This test is no t yet approved or cleared by the [place]  FDA and  has been authorized for detection and/or diagnosis of SARS-CoV-2 by FDA under an Emergency Use Authorization (EUA). This EUA will remain  in effect (meaning this test can be used) for the duration of the COVID-19 declaration under Section 564(b)(1) of the Act, 21 U.S.C.section 360bbb-3(b)(1), unless the authorization is terminated  or revoked sooner.       Influenza A by PCR NEGATIVE NEGATIVE Final   Influenza B by PCR NEGATIVE NEGATIVE Final    Comment: (NOTE) The Xpert Xpress SARS-CoV-2/FLU/RSV plus assay is intended as an aid in the diagnosis of influenza from Nasopharyngeal swab specimens and should not be used as a sole basis for treatment. Nasal washings and aspirates are unacceptable for Xpert Xpress SARS-CoV-2/FLU/RSV testing.  Fact Sheet for Patients: bloggercourse.com  Fact Sheet for Healthcare Providers: seriousbroker.it  This test is not yet approved or cleared by the [place]  FDA and has been authorized for detection and/or diagnosis of SARS-CoV-2 by FDA under an Emergency Use Authorization (EUA). This EUA will remain in effect (meaning this test can be used) for the duration of the COVID-19 declaration under Section 564(b)(1) of the Act, 21 U.S.C. section 360bbb-3(b)(1), unless the authorization is terminated or revoked.     Resp Syncytial Virus by PCR NEGATIVE NEGATIVE Final    Comment: (NOTE) Fact Sheet for Patients: bloggercourse.com  Fact Sheet for Healthcare Providers: seriousbroker.it  This test is not yet approved or cleared by the [place]  FDA and has been authorized for detection and/or  diagnosis of SARS-CoV-2 by FDA under an Emergency Use Authorization (EUA). This EUA will remain in effect (meaning this test can be used) for the duration of the COVID-19 declaration under Section 564(b)(1) of the Act, 21 U.S.C. section 360bbb-3(b)(1), unless the authorization is terminated or revoked.  Performed at [facility], [address] [place], [place] [address]  Radiology Studies: CT HEAD WO CONTRAST ( ) Result Date: 12/28/2023 CLINICAL DATA:  Encephalopathy (Ped 0-17y) AMS, recently started on [facility], unknown if falls. r/o ICH EXAM: CT HEAD WITHOUT CONTRAST TECHNIQUE: Contiguous axial images were obtained from the base of the skull through the vertex without intravenous contrast. RADIATION DOSE REDUCTION: This exam was performed according to the departmental dose-optimization program which includes automated exposure control, adjustment of the mA and/or kV according to patient size and/or use of iterative reconstruction technique. COMPARISON:  CT head 11/26/2023 FINDINGS: Brain: No evidence of large-territorial acute infarction. No parenchymal hemorrhage. No mass lesion. No extra-axial collection. No mass effect or midline shift. No hydrocephalus. Basilar cisterns are patent. Vascular: No hyperdense vessel. Atherosclerotic calcifications are present within the cavernous internal carotid arteries. Skull: No acute fracture or focal lesion. Sinuses/Orbits: Paranasal sinuses and mastoid air cells are clear. The orbits are unremarkable. Other: None. IMPRESSION: No acute intracranial abnormality. Electronically Signed   By: Morgane  Naveau M.D.   On: 12/28/2023 00:06   DG Chest Port 1 View Result Date: 12/27/2023 CLINICAL DATA:  Possible sepsis EXAM: PORTABLE CHEST 1 VIEW COMPARISON:  11/12/2023 FINDINGS: Check shadow is stable. Aortic calcifications are noted. Left lung is clear. Right-sided effusion is noted new from the prior exam. No bony abnormality is noted. IMPRESSION: New right  pleural effusion. Electronically Signed   By: Oneil Devonshire M.D.   On: 12/27/2023 19:27        Scheduled Meds:  atorvastatin   40 mg Oral QHS   ferrous sulfate   325 mg Oral Q breakfast   hydrocortisone  sod succinate (SOLU-CORTEF ) inj  100 mg Intravenous Q12H   melatonin  6 mg Oral QHS   metroNIDAZOLE   500 mg Oral Q12H   midodrine   10 mg Oral TID WC   pantoprazole   40 mg Oral Daily   senna-docusate  2 tablet Oral QPC supper   sodium bicarbonate   650 mg Oral BID   sodium chloride  flush  3 mL Intravenous Q12H   Continuous Infusions:  ceFEPime  (MAXIPIME ) IV Stopped (12/28/23 0616)   vancomycin  Stopped (12/28/23 0054)     LOS: 1 day    Time spent: 55 minutes    Tayanna Talford D Maree, DO [facility]  If 7PM-7AM, please contact night-coverage [email or web address] 12/28/2023, 8:23 AM

## 2023-12-29 DIAGNOSIS — R579 Shock, unspecified: Secondary | ICD-10-CM | POA: Diagnosis not present

## 2023-12-29 LAB — TYPE AND SCREEN
ABO/RH(D): O POS
Antibody Screen: NEGATIVE
Unit division: 0
Unit division: 0

## 2023-12-29 LAB — BPAM RBC
Blood Product Expiration Date: 202501282359
Blood Product Expiration Date: 202501282359
ISSUE DATE / TIME: 202501030504
ISSUE DATE / TIME: 202501030730
Unit Type and Rh: 5100
Unit Type and Rh: 5100

## 2023-12-29 LAB — MAGNESIUM: Magnesium: 1.9 mg/dL (ref 1.7–2.4)

## 2023-12-29 LAB — CBC
HCT: 24.9 % — ABNORMAL LOW (ref 39.0–52.0)
Hemoglobin: 8.3 g/dL — ABNORMAL LOW (ref 13.0–17.0)
MCH: 29 pg (ref 26.0–34.0)
MCHC: 33.3 g/dL (ref 30.0–36.0)
MCV: 87.1 fL (ref 80.0–100.0)
Platelets: 141 10*3/uL — ABNORMAL LOW (ref 150–400)
RBC: 2.86 MIL/uL — ABNORMAL LOW (ref 4.22–5.81)
RDW: 16.2 % — ABNORMAL HIGH (ref 11.5–15.5)
WBC: 14.3 10*3/uL — ABNORMAL HIGH (ref 4.0–10.5)
nRBC: 0 % (ref 0.0–0.2)

## 2023-12-29 LAB — COMPREHENSIVE METABOLIC PANEL
ALT: 28 U/L (ref 0–44)
AST: 56 U/L — ABNORMAL HIGH (ref 15–41)
Albumin: 1.6 g/dL — ABNORMAL LOW (ref 3.5–5.0)
Alkaline Phosphatase: 87 U/L (ref 38–126)
Anion gap: 11 (ref 5–15)
BUN: 45 mg/dL — ABNORMAL HIGH (ref 8–23)
CO2: 18 mmol/L — ABNORMAL LOW (ref 22–32)
Calcium: 7.3 mg/dL — ABNORMAL LOW (ref 8.9–10.3)
Chloride: 104 mmol/L (ref 98–111)
Creatinine, Ser: 1.11 mg/dL (ref 0.61–1.24)
GFR, Estimated: >60 mL/min (ref >60)
Glucose, Bld: 165 mg/dL — ABNORMAL HIGH (ref 70–99)
Potassium: 3.9 mmol/L (ref 3.5–5.1)
Sodium: 133 mmol/L — ABNORMAL LOW (ref 135–145)
Total Bilirubin: 0.9 mg/dL (ref 0.0–1.2)
Total Protein: 5 g/dL — ABNORMAL LOW (ref 6.5–8.1)

## 2023-12-29 NOTE — Plan of Care (Signed)
   Problem: Nutrition: Goal: Adequate nutrition will be maintained Outcome: Progressing   Problem: Safety: Goal: Ability to remain free from injury will improve Outcome: Progressing   Problem: Skin Integrity: Goal: Risk for impaired skin integrity will decrease Outcome: Progressing

## 2023-12-29 NOTE — Progress Notes (Signed)
 Patient arrived to unit 300, blood pressure 92/72 map 79. Patient coughed up a small amount of blood. MD Sherryll Burger made aware. No new orders.

## 2023-12-29 NOTE — Progress Notes (Signed)
 PROGRESS NOTE    [name]  FMW:[id number] DOB: 1951-01-22 DOA: 12/27/2023 PCP: Terry Wilhelmena Lloyd Hilario, FNP   Brief Narrative:    [name] is a 73 y.o. male with recent hospitalization at [facility] from 12/2 -17 when he was initially found down, encephalopathic; Treated for shock requiring vasopressors, hypovolemia, scrotal cellulitis, rhabdomylolysis, hypercalcemia of maligancy, acute DVT of RUE and started on [facility]. During his evaluation found to have poorly differentiated adenocarcinoma with unknown primary via Bx of soft tissue mass of abd, additional imaging with vertebral with possible epidural spread, diffuse bone, bilateral adrenal, and diffuse LAD and soft tissue / intramuscular  metastases. He was seen by Oncology and planned to have outpatient evaluation by Dr. Rogers (scheduled for 1/6) to see if a candidate for immunotherapy. His overall prognosis is poor and he elected for change to DNR/DNI last admission. Other medical hx notable for: Hypertension, hyperlipidemia, CKD 3, PAD with right AKA, prostate cancer and prostatectomy.    He was brought in from [facility] today reportedly due to decreased intake, altered mental status and found to have tachycardia and hypotension.  His blood pressures have improved with PRBC transfusion.  Oncology recommends palliative consultation and hospice placement.  Hospice RN does not feel that he qualifies for inpatient placement at this point.  Assessment & Plan:   Principal Problem:   Shock (HCC)  Assessment and Plan:  Shock, suspect mixed hypovolemic, and distributive (AI v septic) On presentation hypotensive to 80s over 40s, sinus tachycardia in the 110s to 120s.  Placed on O2 but never hypoxic. Afebrile. WBC 16, lactate rising to 2.  Flu/COVID/RSV negative.  Chest x-ray with new right pleural effusion but no infiltrates. With history decreased intake suspected hypovolemic however his blood pressure has marginally improved  after 3 L IV fluid.  Otherwise known history of bilateral adrenal mets making adrenal insufficiency is strong possibility.  May have sepsis although no localizing symptoms, still waiting on urinalysis.  - Continue empiric broad-spectrum antibiotics with vancomycin , cefepime  2 g IV every 12 hours, metronidazole  500 mg p.o. every 12 hours.  Follow-up blood cultures with no growth noted in 2 days - UA suggestive of UTI - Status post 3 L IV fluid with overall worsening lactate, has anasarca which may be related to low albumin .  However due to no response to large-volume fluid resuscitation stop IV fluids. - Trend lactate - Cosyntropin  stim test, after cortisol checked start on empiric hydrocortisone  25 mg IV every 6 hours for presumed adrenal insufficiency - Start midodrine  10 mg 3 times daily - If persistent hypotension despite above will need initiation of vasopressors   Acute kidney injury stage I Background CKD 3 Baseline creatinine approximately 0.9, elevated to 1.6 on admission.  No urine output despite large-volume fluid resuscitation.  Suspect prerenal +/- urinary retention - Bladder scan then In-N-Out catheter UA - Check PVR if he can spontaneously void.  If requiring recurrent In-N-Out cath would place Foley catheter. - Status post large-volume IV fluid per above.  Holding fluids with worsening lactate trend - Continue home sodium bicarb 650 mg twice daily for now   Encephalopathy, acute possibly related to UTI-improving Noted to have disorientation, impaired attention and memory.  Most likely delirium in the setting of underlying illness.  Likely multifactorial with hypoperfusion/shock, possible underlying infection, known malignancy, sedating meds (Flexeril , gabapentin , oxybutynin , oxycodone ). Reports fall + recently started on [facility], will r/o ICH  - CT head negative for ICH - Management of  shock/possible infection, malignancy per other problem list - UA with possible UTI - Hold Flexeril ,  gabapentin , oxybutynin . Reduce his home oxycodone  per below - Delirium precautions   Recently diagnosed metastatic poorly differentiated adenocarcinoma with unknown primary Mets to vertebral with possible epidural spread, diffuse bone, bilateral adrenal, and diffuse LAD and soft tissue / intramuscular areas. - Discussed case with Dr. Katragadda and patient is not a candidate for any form of therapy at this time.  Palliative consulted and recommend hospice care.   Goals of care - Currently DNR/DNI with out of hospital durable DNR - Hospice referral approved by sister   Deconditioning  -PT, OT evaluation.  Nutrition evaluation   Chronic medical problems: Recently diagnosed right upper extremity DVT: Continue home apixaban  History of hypertension, no longer an issue now with persistent hypotension: Starting on midodrine  per above.  Not on any antihypertensives. Hyperlipidemia: Continues on her atorvastatin  although may have limited utility with his overall prognosis consider discontinuation. PAD, right BKA, diminished left pedal pulses likely chronic ischemic changes developing in the left foot: Perfusion into the left foot may be further diminished by hypotension and medications including midodrine .  However feel needed due to his systemic hypoperfusion.  Chronic anemia, anemia of chronic disease: Continue home iron , trend Hb  GERD: Continue home pantoprazole  History of prostate cancer status post prostatectomy: Outpatient follow-up   DVT prophylaxis:apixaban  Code Status: DNR Family Communication: Discussed with sister on phone 12/29/2023 who is agreeable to hospice placement Disposition Plan:  Status is: Inpatient Remains inpatient appropriate because: Need for IV medications.   Consultants:  Oncology-discussed with Dr. Rogers Palliative  Procedures:  None  Antimicrobials:  Anti-infectives (From admission, onward)    Start     Dose/Rate Route Frequency Ordered Stop    12/28/23 0500  ceFEPIme  (MAXIPIME ) 2 g in sodium chloride  0.9 % 100 mL IVPB        2 g 200 mL/hr over 30 Minutes Intravenous Every 12 hours 12/27/23 2238     12/28/23 0500  metroNIDAZOLE  (FLAGYL ) tablet 500 mg        500 mg Oral Every 12 hours 12/27/23 2238     12/27/23 2327  vancomycin  (VANCOCIN ) IVPB 1000 mg/200 mL premix        1,000 mg 200 mL/hr over 60 Minutes Intravenous Every 24 hours 12/27/23 2328     12/27/23 1700  ceFEPIme  (MAXIPIME ) 2 g in sodium chloride  0.9 % 100 mL IVPB        2 g 200 mL/hr over 30 Minutes Intravenous  Once 12/27/23 1648 12/27/23 1757   12/27/23 1700  metroNIDAZOLE  (FLAGYL ) IVPB 500 mg        500 mg 100 mL/hr over 60 Minutes Intravenous  Once 12/27/23 1648 12/27/23 1854   12/27/23 1700  vancomycin  (VANCOCIN ) IVPB 1000 mg/200 mL premix  Status:  Discontinued        1,000 mg 200 mL/hr over 60 Minutes Intravenous  Once 12/27/23 1648 12/27/23 1651   12/27/23 1700  vancomycin  (VANCOCIN ) IVPB 1000 mg/200 mL premix        1,000 mg 200 mL/hr over 60 Minutes Intravenous Every 1 hr x 2 12/27/23 1651 12/27/23 1955      Subjective: Patient seen and evaluated today with no new acute complaints or concerns. No acute concerns or events noted overnight.  His blood pressures have improved.  He is tearful about the prospect of going to hospice.  Objective: Vitals:   12/29/23 0800 12/29/23 0900 12/29/23 1105 12/29/23 1529  BP: ROLLEN)  110/46 (!) 121/52 92/72 123/67  Pulse: 93 89 97 89  Resp:   20 19  Temp:   97.7 F (36.5 C) 97.9 F (36.6 C)  TempSrc:   Oral   SpO2: 99% 99% 96% 95%  Weight:      Height:        Intake/Output Summary (Last 24 hours) at 12/29/2023 1541 Last data filed at 12/29/2023 1300 Gross per 24 hour  Intake 635.72 ml  Output 100 ml  Net 535.72 ml   Filed Weights   12/27/23 1620 12/28/23 1700  Weight: 86.2 kg 100.7 kg    Examination:  General exam: Appears calm and comfortable  Respiratory system: Clear to auscultation. Respiratory effort  normal. Cardiovascular system: S1 & S2 heard, RRR.  Gastrointestinal system: Abdomen is soft Central nervous system: Alert and awake Extremities: No edema, right BKA Skin: No significant lesions noted Psychiatry: Flat affect.    Data Reviewed: I have personally reviewed following labs and imaging studies  CBC: Recent Labs  Lab 12/27/23 1640 12/28/23 0311 12/29/23 0525  WBC 16.2* 12.9* 14.3*  NEUTROABS 13.5*  --   --   HGB 7.5* 5.2* 8.3*  HCT 24.2* 16.9* 24.9*  MCV 90.3 91.8 87.1  PLT 219 155 141*   Basic Metabolic Panel: Recent Labs  Lab 12/27/23 1640 12/27/23 1717 12/28/23 0311 12/29/23 0525  NA 132*  --  132* 133*  K 4.7  --  4.2 3.9  CL 100  --  102 104  CO2 19*  --  18* 18*  GLUCOSE 80  --  105* 165*  BUN 50*  --  44* 45*  CREATININE 1.67*  --  1.34* 1.11  CALCIUM  7.8*  --  7.2* 7.3*  MG  --   --  1.9 1.9  PHOS  --  4.6 3.6  --    GFR: Estimated Creatinine Clearance: 70.5 mL/min (by C-G formula based on SCr of 1.11 mg/dL). Liver Function Tests: Recent Labs  Lab 12/27/23 1640 12/29/23 0525  AST 56* 56*  ALT 33 28  ALKPHOS 132* 87  BILITOT 0.8 0.9  PROT 6.6 5.0*  ALBUMIN  1.7* 1.6*   No results for input(s): LIPASE, AMYLASE in the last 168 hours. No results for input(s): AMMONIA in the last 168 hours. Coagulation Profile: Recent Labs  Lab 12/27/23 1717  INR 2.8*   Cardiac Enzymes: Recent Labs  Lab 12/27/23 1717  CKTOTAL 221   BNP (last 3 results) No results for input(s): PROBNP in the last 8760 hours. HbA1C: No results for input(s): HGBA1C in the last 72 hours. CBG: No results for input(s): GLUCAP in the last 168 hours. Lipid Profile: No results for input(s): CHOL, HDL, LDLCALC, TRIG, CHOLHDL, LDLDIRECT in the last 72 hours. Thyroid  Function Tests: Recent Labs    12/27/23 1640  TSH 1.371   Anemia Panel: No results for input(s): VITAMINB12, FOLATE, FERRITIN, TIBC, IRON , RETICCTPCT in the last 72  hours. Sepsis Labs: Recent Labs  Lab 12/27/23 1640 12/27/23 1923  LATICACIDVEN 1.2 2.0*    Recent Results (from the past 240 hours)  Blood Culture (routine x 2)     Status: None (Preliminary result)   Collection Time: 12/27/23  5:17 PM   Specimen: BLOOD  Result Value Ref Range Status   Specimen Description BLOOD BLOOD RIGHT HAND  Final   Special Requests   Final    BOTTLES DRAWN AEROBIC ONLY Blood Culture results may not be optimal due to an inadequate volume of blood received in culture  bottles   Culture   Final    NO GROWTH 2 DAYS Performed at [facility], [address] [place], [place] [address]    Report Status PENDING  Incomplete  Blood Culture (routine x 2)     Status: None (Preliminary result)   Collection Time: 12/27/23  5:17 PM   Specimen: BLOOD  Result Value Ref Range Status   Specimen Description BLOOD RIGHT SHOULDER  Final   Special Requests   Final    BOTTLES DRAWN AEROBIC ONLY Blood Culture results may not be optimal due to an inadequate volume of blood received in culture bottles   Culture   Final    NO GROWTH 2 DAYS Performed at [facility], [address] [place], [place] [address]    Report Status PENDING  Incomplete  Resp panel by RT-PCR (RSV, Flu A&B, Covid) Anterior Nasal Swab     Status: None   Collection Time: 12/27/23  8:43 PM   Specimen: Anterior Nasal Swab  Result Value Ref Range Status   SARS Coronavirus 2 by RT PCR NEGATIVE NEGATIVE Final    Comment: (NOTE) SARS-CoV-2 target nucleic acids are NOT DETECTED.  The SARS-CoV-2 RNA is generally detectable in upper respiratory specimens during the acute phase of infection. The lowest concentration of SARS-CoV-2 viral copies this assay can detect is 138 copies/mL. A negative result does not preclude SARS-Cov-2 infection and should not be used as the sole basis for treatment or other patient management decisions. A negative result may occur with  improper specimen collection/handling, submission  of specimen other than nasopharyngeal swab, presence of viral mutation(s) within the areas targeted by this assay, and inadequate number of viral copies(<138 copies/mL). A negative result must be combined with clinical observations, patient history, and epidemiological information. The expected result is Negative.  Fact Sheet for Patients:  bloggercourse.com  Fact Sheet for Healthcare Providers:  seriousbroker.it  This test is no t yet approved or cleared by the [place]  FDA and  has been authorized for detection and/or diagnosis of SARS-CoV-2 by FDA under an Emergency Use Authorization (EUA). This EUA will remain  in effect (meaning this test can be used) for the duration of the COVID-19 declaration under Section 564(b)(1) of the Act, 21 U.S.C.section 360bbb-3(b)(1), unless the authorization is terminated  or revoked sooner.       Influenza A by PCR NEGATIVE NEGATIVE Final   Influenza B by PCR NEGATIVE NEGATIVE Final    Comment: (NOTE) The Xpert Xpress SARS-CoV-2/FLU/RSV plus assay is intended as an aid in the diagnosis of influenza from Nasopharyngeal swab specimens and should not be used as a sole basis for treatment. Nasal washings and aspirates are unacceptable for Xpert Xpress SARS-CoV-2/FLU/RSV testing.  Fact Sheet for Patients: bloggercourse.com  Fact Sheet for Healthcare Providers: seriousbroker.it  This test is not yet approved or cleared by the [place]  FDA and has been authorized for detection and/or diagnosis of SARS-CoV-2 by FDA under an Emergency Use Authorization (EUA). This EUA will remain in effect (meaning this test can be used) for the duration of the COVID-19 declaration under Section 564(b)(1) of the Act, 21 U.S.C. section 360bbb-3(b)(1), unless the authorization is terminated or revoked.     Resp Syncytial Virus by PCR NEGATIVE NEGATIVE  Final    Comment: (NOTE) Fact Sheet for Patients: bloggercourse.com  Fact Sheet for Healthcare Providers: seriousbroker.it  This test is not yet approved or cleared by the [place]  FDA and has been authorized for detection and/or diagnosis of SARS-CoV-2  by FDA under an Emergency Use Authorization (EUA). This EUA will remain in effect (meaning this test can be used) for the duration of the COVID-19 declaration under Section 564(b)(1) of the Act, 21 U.S.C. section 360bbb-3(b)(1), unless the authorization is terminated or revoked.  Performed at [facility], [address], [place], [place] [address]   Urine Culture     Status: None (Preliminary result)   Collection Time: 12/28/23 12:17 AM   Specimen: Urine, Random  Result Value Ref Range Status   Specimen Description   Final    URINE, RANDOM Performed at [facility], [address] [place], [place] [address]    Special Requests   Final    NONE Reflexed from [id number] Performed at [facility], [address] [place], [place] [address]    Culture   Final    CULTURE REINCUBATED FOR BETTER GROWTH Performed at [facility], [address], [place], [place] [address]    Report Status PENDING  Incomplete         Radiology Studies: CT HEAD WO CONTRAST ( ) Result Date: 12/28/2023 CLINICAL DATA:  Encephalopathy (Ped 0-17y) AMS, recently started on [facility], unknown if falls. r/o ICH EXAM: CT HEAD WITHOUT CONTRAST TECHNIQUE: Contiguous axial images were obtained from the base of the skull through the vertex without intravenous contrast. RADIATION DOSE REDUCTION: This exam was performed according to the departmental dose-optimization program which includes automated exposure control, adjustment of the mA and/or kV according to patient size and/or use of iterative reconstruction technique. COMPARISON:  CT head 11/26/2023 FINDINGS: Brain: No evidence of large-territorial acute  infarction. No parenchymal hemorrhage. No mass lesion. No extra-axial collection. No mass effect or midline shift. No hydrocephalus. Basilar cisterns are patent. Vascular: No hyperdense vessel. Atherosclerotic calcifications are present within the cavernous internal carotid arteries. Skull: No acute fracture or focal lesion. Sinuses/Orbits: Paranasal sinuses and mastoid air cells are clear. The orbits are unremarkable. Other: None. IMPRESSION: No acute intracranial abnormality. Electronically Signed   By: Morgane  Naveau M.D.   On: 12/28/2023 00:06   DG Chest Port 1 View Result Date: 12/27/2023 CLINICAL DATA:  Possible sepsis EXAM: PORTABLE CHEST 1 VIEW COMPARISON:  11/12/2023 FINDINGS: Check shadow is stable. Aortic calcifications are noted. Left lung is clear. Right-sided effusion is noted new from the prior exam. No bony abnormality is noted. IMPRESSION: New right pleural effusion. Electronically Signed   By: Oneil Devonshire M.D.   On: 12/27/2023 19:27        Scheduled Meds:  atorvastatin   40 mg Oral QHS   Chlorhexidine  Gluconate Cloth  6 each Topical Daily   feeding supplement  237 mL Oral BID BM   ferrous sulfate   325 mg Oral Q breakfast   hydrocortisone  sod succinate (SOLU-CORTEF ) inj  100 mg Intravenous Q12H   melatonin  6 mg Oral QHS   metroNIDAZOLE   500 mg Oral Q12H   midodrine   10 mg Oral TID WC   pantoprazole   40 mg Oral Daily   senna-docusate  2 tablet Oral QPC supper   sodium bicarbonate   650 mg Oral BID   sodium chloride  flush  3 mL Intravenous Q12H   Continuous Infusions:  ceFEPime  (MAXIPIME ) IV Stopped (12/29/23 0533)   vancomycin  1,000 mg (12/28/23 2304)     LOS: 2 days    Time spent: 35 minutes    Makynlee Kressin JONETTA Fairly, DO [facility]  If 7PM-7AM, please contact night-coverage [email or web address] 12/29/2023, 3:41 PM

## 2023-12-29 NOTE — TOC Progression Note (Signed)
 Transition of Care [facility]) - Progression Note    Patient Details  Name: [name] MRN: [id number] Date of Birth: Nov 06, 1951  [facility] of [facility]) CM/SW Contact  Lorraine LILLETTE Fenton, LCSW Phone Number: 12/29/2023, 3:38 PM  Clinical Narrative:    Hospice assessed pt today, found ineligible for [facility] at this time, they will reassess on Monday.  TOC to follow.    Expected Discharge Plan: Skilled Nursing Facility Barriers to Discharge: Continued Medical Work up  Expected Discharge Plan and Services In-house Referral: Clinical Social Work Discharge Planning Services: CM Consult Post Acute Care Choice: Skilled Nursing Facility Living arrangements for the past 2 months: Single Family Home                                       Social Determinants of Health (SDOH) Interventions SDOH Screenings   Food Insecurity: Patient Unable To Answer (12/28/2023)  Recent Concern: Food Insecurity - Food Insecurity Present (10/09/2023)  Housing: Patient Unable To Answer (12/28/2023)  Transportation Needs: Patient Unable To Answer (12/28/2023)  Utilities: Patient Unable To Answer (11/26/2023)  Depression (PHQ2-9): Low Risk  (10/10/2023)  Financial Resource Strain: Medium Risk (10/09/2023)  Physical Activity: Unknown (10/09/2023)  Social Connections: Unknown (12/28/2023)  Stress: No Stress Concern Present (10/09/2023)  Tobacco Use: Medium Risk (12/27/2023)    Readmission Risk Interventions    12/28/2023   11:16 AM  Readmission Risk Prevention Plan  Transportation Screening Complete  Medication Review (RN Care Manager) Complete  HRI or Home Care Consult Complete  SW Recovery Care/Counseling Consult Complete  Palliative Care Screening Complete  Skilled Nursing Facility Complete

## 2023-12-30 DIAGNOSIS — R579 Shock, unspecified: Secondary | ICD-10-CM | POA: Diagnosis not present

## 2023-12-30 LAB — BASIC METABOLIC PANEL
Anion gap: 10 (ref 5–15)
BUN: 44 mg/dL — ABNORMAL HIGH (ref 8–23)
CO2: 20 mmol/L — ABNORMAL LOW (ref 22–32)
Calcium: 7.4 mg/dL — ABNORMAL LOW (ref 8.9–10.3)
Chloride: 103 mmol/L (ref 98–111)
Creatinine, Ser: 1.01 mg/dL (ref 0.61–1.24)
GFR, Estimated: >60 mL/min (ref >60)
Glucose, Bld: 171 mg/dL — ABNORMAL HIGH (ref 70–99)
Potassium: 3.5 mmol/L (ref 3.5–5.1)
Sodium: 133 mmol/L — ABNORMAL LOW (ref 135–145)

## 2023-12-30 LAB — URINE CULTURE

## 2023-12-30 LAB — CBC
HCT: 27.1 % — ABNORMAL LOW (ref 39.0–52.0)
Hemoglobin: 8.7 g/dL — ABNORMAL LOW (ref 13.0–17.0)
MCH: 27.9 pg (ref 26.0–34.0)
MCHC: 32.1 g/dL (ref 30.0–36.0)
MCV: 86.9 fL (ref 80.0–100.0)
Platelets: 136 10*3/uL — ABNORMAL LOW (ref 150–400)
RBC: 3.12 MIL/uL — ABNORMAL LOW (ref 4.22–5.81)
RDW: 16.2 % — ABNORMAL HIGH (ref 11.5–15.5)
WBC: 17.3 10*3/uL — ABNORMAL HIGH (ref 4.0–10.5)
nRBC: 0 % (ref 0.0–0.2)

## 2023-12-30 LAB — MAGNESIUM: Magnesium: 1.8 mg/dL (ref 1.7–2.4)

## 2023-12-30 MED ORDER — LORAZEPAM 2 MG/ML IJ SOLN
1.0000 mg | INTRAMUSCULAR | Status: DC | PRN
  Administered 2023-12-31 – 2024-01-01 (×3): 1 mg via INTRAVENOUS
  Filled 2023-12-30 (×3): qty 1

## 2023-12-30 MED ORDER — GLYCOPYRROLATE 1 MG PO TABS: 1.0000 mg | ORAL_TABLET | ORAL | Status: DC | PRN

## 2023-12-30 MED ORDER — VANCOMYCIN HCL 1500 MG/300ML IV SOLN: 1500.0000 mg | INTRAVENOUS | Status: DC

## 2023-12-30 MED ORDER — MORPHINE SULFATE (PF) 2 MG/ML IV SOLN
1.0000 mg | INTRAVENOUS | Status: DC | PRN
  Administered 2023-12-30 – 2024-01-01 (×9): 2 mg via INTRAVENOUS
  Filled 2023-12-30 (×9): qty 1

## 2023-12-30 MED ORDER — LORAZEPAM 2 MG/ML PO CONC: 1.0000 mg | ORAL | Status: DC | PRN

## 2023-12-30 MED ORDER — LORAZEPAM 1 MG PO TABS
1.0000 mg | ORAL_TABLET | ORAL | Status: DC | PRN
  Administered 2023-12-31: 1 mg via ORAL
  Filled 2023-12-30: qty 1

## 2023-12-30 MED ORDER — GLYCOPYRROLATE 0.2 MG/ML IJ SOLN
0.2000 mg | INTRAMUSCULAR | Status: DC | PRN
  Administered 2023-12-31: 0.2 mg via INTRAVENOUS
  Filled 2023-12-30: qty 1

## 2023-12-30 MED ORDER — SODIUM CHLORIDE 0.9 % IV SOLN
2.0000 g | Freq: Three times a day (TID) | INTRAVENOUS | Status: DC
  Administered 2023-12-30: 2 g via INTRAVENOUS
  Filled 2023-12-30: qty 12.5

## 2023-12-30 MED ORDER — GLYCOPYRROLATE 0.2 MG/ML IJ SOLN: 0.2000 mg | INTRAMUSCULAR | Status: DC | PRN

## 2023-12-30 NOTE — [place] FL2 (Signed)
 [place]  MEDICAID FL2 LEVEL OF CARE FORM     IDENTIFICATION  Patient Name: [name] Birthdate: 1951-04-28 Sex: male Admission Date (Current Location): 12/27/2023  [facility] and [place] Number:  [employer] and Address:  [facility],  [address], [place] [address]      Provider Number: [id number]  Attending Physician Name and Address:  Maree Adron BIRCH, DO  Relative Name and Phone Number:       Current Level of Care: Hospital Recommended Level of Care: Skilled Nursing Facility Prior Approval Number:    Date Approved/Denied:   PASRR Number:    Discharge Plan: SNF    Current Diagnoses: Patient Active Problem List   Diagnosis Date Noted   Shock (HCC) 12/27/2023   Metastatic adenocarcinoma (HCC) 11/30/2023   Malnutrition of moderate degree 11/29/2023   Hypovolemic shock (HCC) 11/27/2023   Rhabdomyolysis 11/27/2023   Pressure injury of skin 11/27/2023   AKI (acute kidney injury) (HCC) 11/27/2023   DVT (deep venous thrombosis) (HCC) 11/27/2023   GERD (gastroesophageal reflux disease) 11/27/2023   Transaminitis 11/27/2023   Sepsis (HCC) 11/26/2023   Anemia 10/19/2023   Abdominal pain 10/10/2023   Cough 10/10/2023   Abnormal gait 07/27/2023   Hypertension 07/10/2023   Blister of left leg 05/24/2023   Acute blood loss anemia 03/27/2023   Phantom pain after amputation of lower extremity (HCC) 03/27/2023   Adjustment disorder with mixed anxiety and depressed mood 03/15/2023   Above knee amputation of right lower extremity (HCC) 03/13/2023   CKD (chronic kidney disease) stage 3, GFR 30-59 ml/min (HCC) 03/13/2023   Cellulitis of right lower extremity 03/09/2023   Fever 03/09/2023   Non-traumatic rhabdomyolysis 03/09/2023   Bandemia 03/09/2023   Acute kidney injury superimposed on chronic kidney disease (HCC) 03/05/2023   Hyperkalemia 03/05/2023   Normocytic anemia 03/05/2023   History of prostate cancer 03/05/2023   Essential  hypertension 03/05/2023   Metabolic acidosis, increased anion gap 03/05/2023   Critical limb ischemia of right lower extremity (HCC) 12/12/2022   Malignant neoplasm of prostate (HCC) 03/18/2019    Orientation RESPIRATION BLADDER Height & Weight     Self  Normal Incontinent Weight: 222 lb 0.1 oz (100.7 kg) Height:  5' 10 (177.8 cm)  BEHAVIORAL SYMPTOMS/MOOD NEUROLOGICAL BOWEL NUTRITION STATUS      Incontinent Diet (see dc summary)  AMBULATORY STATUS COMMUNICATION OF NEEDS Skin   Total Care Verbally PU Stage and Appropriate Care (buttocks and sacrum)   PU Stage 2 Dressing: Daily                   Personal Care Assistance Level of Assistance    Bathing Assistance: Maximum assistance Feeding assistance: Limited assistance Dressing Assistance: Maximum assistance     Functional Limitations Info    Sight Info: Impaired Hearing Info: Adequate Speech Info: Adequate    SPECIAL CARE FACTORS FREQUENCY        PT Frequency: 5x week OT Frequency: 5x week            Contractures Contractures Info: Not present    Additional Factors Info    Code Status Info: DNR Allergies Info: Shellfish, Sulfa Antibiotics           Current Medications (12/30/2023):  This is the current hospital active medication list Current Facility-Administered Medications  Medication Dose Route Frequency Provider Last Rate Last Admin   acetaminophen  (TYLENOL ) tablet 1,000 mg  1,000 mg Oral Q6H PRN Keturah Carrier, MD  albuterol  (PROVENTIL ) (2.5 MG/3ML) 0.083% nebulizer solution 2.5 mg  2.5 mg Nebulization Q4H PRN Segars, Dorn, MD       feeding supplement (ENSURE ENLIVE / ENSURE PLUS) liquid 237 mL  237 mL Oral BID BM Maree, Pratik D, DO   237 mL at 12/30/23 1326   glycopyrrolate  (ROBINUL ) tablet 1 mg  1 mg Oral Q4H PRN Maree, Pratik D, DO       Or   glycopyrrolate  (ROBINUL ) injection 0.2 mg  0.2 mg Subcutaneous Q4H PRN Maree, Pratik D, DO       Or   glycopyrrolate  (ROBINUL ) injection 0.2 mg   0.2 mg Intravenous Q4H PRN Maree, Pratik D, DO       LORazepam  (ATIVAN ) tablet 1 mg  1 mg Oral Q4H PRN Maree, Pratik D, DO       Or   LORazepam  (ATIVAN ) injection 1 mg  1 mg Intravenous Q4H PRN Maree, Pratik D, DO       melatonin tablet 6 mg  6 mg Oral QHS Segars, Jonathan, MD   6 mg at 12/29/23 2140   morphine  (PF) 2 MG/ML injection 1-4 mg  1-4 mg Intravenous Q15 min PRN Maree, Pratik D, DO   2 mg at 12/30/23 1431   ondansetron  (ZOFRAN ) tablet 4 mg  4 mg Oral Q6H PRN Keturah Dorn, MD       Or   ondansetron  (ZOFRAN ) injection 4 mg  4 mg Intravenous Q6H PRN Segars, Dorn, MD       oxyCODONE  (Oxy IR/ROXICODONE ) immediate release tablet 2.5 mg  2.5 mg Oral Q6H PRN Segars, Jonathan, MD   2.5 mg at 12/30/23 9051   pantoprazole  (PROTONIX ) EC tablet 40 mg  40 mg Oral Daily Segars, Dorn, MD   40 mg at 12/30/23 0939   polyethylene glycol (MIRALAX  / GLYCOLAX ) packet 17 g  17 g Oral Daily PRN Keturah Dorn, MD       senna-docusate (Senokot-S) tablet 2 tablet  2 tablet Oral QPC supper Segars, Jonathan, MD   2 tablet at 12/28/23 1759   sodium chloride  flush (NS) 0.9 % injection 3 mL  3 mL Intravenous Q12H Segars, Jonathan, MD   3 mL at 12/30/23 9060   Facility-Administered Medications Ordered in Other Encounters  Medication Dose Route Frequency Provider Last Rate Last Admin   0.9 %  sodium chloride  infusion (Manually program via Guardrails IV Fluids)  250 mL Intravenous Continuous Katragadda, Sreedhar, MD 0 mL/hr at 10/25/23 1356 75 mL at 12/01/23 0948     Discharge Medications: Please see discharge summary for a list of discharge medications.  Relevant Imaging Results:  Relevant Lab Results:   Additional Information    [name] Petri, LCSW

## 2023-12-30 NOTE — Plan of Care (Signed)

## 2023-12-30 NOTE — Progress Notes (Signed)
 Pharmacy Antibiotic Note  [name] is a 73 y.o. male presented on 12/27/2023 with AMS with concern for sepsis.  Pharmacy has been consulted for vancomycin  dosing. AF, BCX are ngtd. Renal function improved, will adjust dose. Possible deescalation within 24 hours.   Plan: Increase Vancomycin  1500mg  q24h (eAUC 496, Scr 1.01) Increase cefepime  2gm IV q8h F/u cxs and clinical progress Monitor v/S, labs and levels as indicated  Height: 5' 10 (177.8 cm) Weight: 100.7 kg (222 lb 0.1 oz) IBW/kg (Calculated) : 73  Temp (24hrs), Avg:97.9 F (36.6 C), Min:97.7 F (36.5 C), Max:98.2 F (36.8 C)  Recent Labs  Lab 12/27/23 1640 12/27/23 1923 12/28/23 0311 12/29/23 0525 12/30/23 0350  WBC 16.2*  --  12.9* 14.3* 17.3*  CREATININE 1.67*  --  1.34* 1.11 1.01  LATICACIDVEN 1.2 2.0*  --   --   --     Estimated Creatinine Clearance: 77.5 mL/min (by C-G formula based on SCr of 1.01 mg/dL).    Allergies  Allergen Reactions   Shellfish Allergy Swelling   Sulfa Antibiotics Other (See Comments)    Listed in allergies on MAR from facility Unknown reaction    Antimicrobials this admission: Cefepime  1/2 > Flagyl  1/2 > Vancomycin  1/2 >  Microbiology results: 1/2 Bcx: ngtd  1/3 LRK:floupeoz species. NTR 1/2 resp panel: neg 1/2 RSV, COVID, FLU: negative     Thank you for allowing pharmacy to be a part of this patient's care.  Jani Moronta, BS Pharm D, BCPS Clinical Pharmacist 12/30/2023 11:04 AM

## 2023-12-30 NOTE — TOC Progression Note (Signed)
 Transition of Care [facility]) - Progression Note    Patient Details  Name: [name] MRN: [id number] Date of Birth: 1951-12-02  Transition of [facility]) CM/SW Contact  [name] Petri, LCSW Phone Number: 12/30/2023, 2:30 PM  Clinical Narrative:     TOC following. Awaiting re-evaluation from Hospice for possible residential hospice. Spoke with pt's sister about options if hospice does not feel pt a candidate. She states in that case, she would want pt to return to [facility] under his medicare. She is aware that his insurance would have to authorize this. She states she doesn't know what they will do if both hospice and insurance deny pt as he has no funds to pay privately for SNF. Explained that TOC can review with her once decisions made.  Anticipating dc tomorrow if possible per MD.  Expected Discharge Plan: Skilled Nursing Facility Barriers to Discharge: Continued Medical Work up  Expected Discharge Plan and Services In-house Referral: Clinical Social Work Discharge Planning Services: CM Consult Post Acute Care Choice: Skilled Nursing Facility Living arrangements for the past 2 months: Single Family Home                                       Social Determinants of Health (SDOH) Interventions SDOH Screenings   Food Insecurity: Patient Unable To Answer (12/28/2023)  Recent Concern: Food Insecurity - Food Insecurity Present (10/09/2023)  Housing: Patient Unable To Answer (12/28/2023)  Transportation Needs: Patient Unable To Answer (12/28/2023)  Utilities: Patient Unable To Answer (11/26/2023)  Depression (PHQ2-9): Low Risk  (10/10/2023)  Financial Resource Strain: Medium Risk (10/09/2023)  Physical Activity: Unknown (10/09/2023)  Social Connections: Unknown (12/28/2023)  Stress: No Stress Concern Present (10/09/2023)  Tobacco Use: Medium Risk (12/27/2023)    Readmission Risk Interventions    12/28/2023   11:16 AM  Readmission Risk Prevention Plan  Transportation  Screening Complete  Medication Review (RN Care Manager) Complete  HRI or Home Care Consult Complete  SW Recovery Care/Counseling Consult Complete  Palliative Care Screening Complete  Skilled Nursing Facility Complete

## 2023-12-30 NOTE — Progress Notes (Signed)
 PROGRESS NOTE    [name]  FMW:[id number] DOB: 07/23/1951 DOA: 12/27/2023 PCP: Terry Wilhelmena Lloyd Hilario, FNP   Brief Narrative:    [name] is a 73 y.o. male with recent hospitalization at [facility] from 12/[facility] -17 when he was initially found down, encephalopathic; Treated for shock requiring vasopressors, hypovolemia, scrotal cellulitis, rhabdomylolysis, hypercalcemia of maligancy, acute DVT of RUE and started on [facility]. During his evaluation found to have poorly differentiated adenocarcinoma with unknown primary via Bx of soft tissue mass of abd, additional imaging with vertebral with possible epidural spread, diffuse bone, bilateral adrenal, and diffuse LAD and soft tissue / intramuscular  metastases. He was seen by Oncology and planned to have outpatient evaluation by Dr. Rogers (scheduled for 1/6) to see if a candidate for immunotherapy. His overall prognosis is poor and he elected for change to DNR/DNI last admission. Other medical hx notable for: Hypertension, hyperlipidemia, CKD 3, PAD with right AKA, prostate cancer and prostatectomy.    He was brought in from [facility] today reportedly due to decreased intake, altered mental status and found to have tachycardia and hypotension.  His blood pressures have improved with PRBC transfusion.  Oncology recommends palliative consultation and hospice placement.  Hospice RN does not feel that he qualifies for inpatient placement at this point.  After further discussion with family members, he has been transition to comfort measures only on 1/5.  Assessment & Plan:   Principal Problem:   Shock (HCC)  Assessment and Plan:  Shock, suspect mixed hypovolemic, and distributive (AI v septic) On presentation hypotensive to 80s over 40s, sinus tachycardia in the 110s to 120s.  Placed on O2 but never hypoxic. Afebrile. WBC 16, lactate rising to 2.  Flu/COVID/RSV negative.  Chest x-ray with new right pleural effusion but no infiltrates.  With history decreased intake suspected hypovolemic however his blood pressure has marginally improved after 3 L IV fluid.  Otherwise known history of bilateral adrenal mets making adrenal insufficiency is strong possibility.  May have sepsis although no localizing symptoms, still waiting on urinalysis.  - Discontinued medications aside from comfort care   Acute kidney injury stage I Background CKD 3 Baseline creatinine approximately 0.9, elevated to 1.6 on admission.  No urine output despite large-volume fluid resuscitation.  Suspect prerenal +/- urinary retention   Encephalopathy, acute possibly related to UTI-improving Noted to have disorientation, impaired attention and memory.  Most likely delirium in the setting of underlying illness.  Likely multifactorial with hypoperfusion/shock, possible underlying infection, known malignancy, sedating meds (Flexeril , gabapentin , oxybutynin , oxycodone ). Reports fall + recently started on [facility], will r/o ICH  - CT head negative for ICH   Recently diagnosed metastatic poorly differentiated adenocarcinoma with unknown primary Mets to vertebral with possible epidural spread, diffuse bone, bilateral adrenal, and diffuse LAD and soft tissue / intramuscular areas. - Discussed case with Dr. Katragadda and patient is not a candidate for any form of therapy at this time.  Palliative consulted and recommend hospice care.   Goals of care - Currently DNR/DNI with out of hospital durable DNR - Hospice referral approved by sister   Deconditioning  - Plan for discharge back to SNF with hospice services  Transition to comfort care 1/5 and anticipate discharge to facility with hospice services 1/6.     DVT prophylaxis: None Code Status: DNR-comfort care Family Communication: Discussed with sister on phone 12/30/2023 who is agreeable to hospice placement and comfort care Disposition Plan:  Status is: Inpatient Remains inpatient  appropriate because: Need for IV  medications.   Consultants:  Oncology-discussed with Dr. Rogers Palliative  Procedures:  None  Antimicrobials:  Anti-infectives (From admission, onward)    Start     Dose/Rate Route Frequency Ordered Stop   12/30/23 2200  vancomycin  (VANCOREADY) IVPB 1500 mg/300 mL  Status:  Discontinued        1,500 mg 150 mL/hr over 120 Minutes Intravenous Every 24 hours 12/30/23 1135 12/30/23 1416   12/30/23 1400  ceFEPIme  (MAXIPIME ) 2 g in sodium chloride  0.9 % 100 mL IVPB  Status:  Discontinued        2 g 200 mL/hr over 30 Minutes Intravenous Every 8 hours 12/30/23 1135 12/30/23 1416   12/28/23 0500  ceFEPIme  (MAXIPIME ) 2 g in sodium chloride  0.9 % 100 mL IVPB  Status:  Discontinued        2 g 200 mL/hr over 30 Minutes Intravenous Every 12 hours 12/27/23 2238 12/30/23 1135   12/28/23 0500  metroNIDAZOLE  (FLAGYL ) tablet 500 mg  Status:  Discontinued        500 mg Oral Every 12 hours 12/27/23 2238 12/30/23 1416   12/27/23 2327  vancomycin  (VANCOCIN ) IVPB 1000 mg/200 mL premix  Status:  Discontinued        1,000 mg 200 mL/hr over 60 Minutes Intravenous Every 24 hours 12/27/23 2328 12/30/23 1135   12/27/23 1700  ceFEPIme  (MAXIPIME ) 2 g in sodium chloride  0.9 % 100 mL IVPB        2 g 200 mL/hr over 30 Minutes Intravenous  Once 12/27/23 1648 12/27/23 1757   12/27/23 1700  metroNIDAZOLE  (FLAGYL ) IVPB 500 mg        500 mg 100 mL/hr over 60 Minutes Intravenous  Once 12/27/23 1648 12/27/23 1854   12/27/23 1700  vancomycin  (VANCOCIN ) IVPB 1000 mg/200 mL premix  Status:  Discontinued        1,000 mg 200 mL/hr over 60 Minutes Intravenous  Once 12/27/23 1648 12/27/23 1651   12/27/23 1700  vancomycin  (VANCOCIN ) IVPB 1000 mg/200 mL premix        1,000 mg 200 mL/hr over 60 Minutes Intravenous Every 1 hr x 2 12/27/23 1651 12/27/23 1955      Subjective: Patient seen and evaluated today with no new acute complaints or concerns. No acute concerns or events noted overnight.   Objective: Vitals:    12/29/23 1834 12/29/23 2028 12/30/23 0514 12/30/23 1226  BP: (!) 132/47 (!) 132/119 123/67 (!) 159/87  Pulse: 86 88 62   Resp: 18 18 20 18   Temp: 97.8 F (36.6 C) 97.8 F (36.6 C) 98.2 F (36.8 C) 97.7 F (36.5 C)  TempSrc:  Oral  Oral  SpO2: 92% 92% 94% 96%  Weight:      Height:        Intake/Output Summary (Last 24 hours) at 12/30/2023 1417 Last data filed at 12/30/2023 0843 Gross per 24 hour  Intake 660 ml  Output 130 ml  Net 530 ml   Filed Weights   12/27/23 1620 12/28/23 1700  Weight: 86.2 kg 100.7 kg    Examination:  General exam: Appears calm and comfortable  Respiratory system: Clear to auscultation. Respiratory effort normal. Cardiovascular system: S1 & S2 heard, RRR.  Gastrointestinal system: Abdomen is soft Central nervous system: Alert and awake Extremities: No edema, right BKA Skin: No significant lesions noted Psychiatry: Flat affect.    Data Reviewed: I have personally reviewed following labs and imaging studies  CBC: Recent Labs  Lab 12/27/23 1640  12/28/23 0311 12/29/23 0525 12/30/23 0350  WBC 16.2* 12.9* 14.3* 17.3*  NEUTROABS 13.5*  --   --   --   HGB 7.5* 5.2* 8.3* 8.7*  HCT 24.2* 16.9* 24.9* 27.1*  MCV 90.3 91.8 87.1 86.9  PLT 219 155 141* 136*   Basic Metabolic Panel: Recent Labs  Lab 12/27/23 1640 12/27/23 1717 12/28/23 0311 12/29/23 0525 12/30/23 0350  NA 132*  --  132* 133* 133*  K 4.7  --  4.2 3.9 3.5  CL 100  --  102 104 103  CO2 19*  --  18* 18* 20*  GLUCOSE 80  --  105* 165* 171*  BUN 50*  --  44* 45* 44*  CREATININE 1.67*  --  1.34* 1.11 1.01  CALCIUM  7.8*  --  7.2* 7.3* 7.4*  MG  --   --  1.9 1.9 1.8  PHOS  --  4.6 3.6  --   --    GFR: Estimated Creatinine Clearance: 77.5 mL/min (by C-G formula based on SCr of 1.01 mg/dL). Liver Function Tests: Recent Labs  Lab 12/27/23 1640 12/29/23 0525  AST 56* 56*  ALT 33 28  ALKPHOS 132* 87  BILITOT 0.8 0.9  PROT 6.6 5.0*  ALBUMIN  1.7* 1.6*   No results for  input(s): LIPASE, AMYLASE in the last 168 hours. No results for input(s): AMMONIA in the last 168 hours. Coagulation Profile: Recent Labs  Lab 12/27/23 1717  INR 2.8*   Cardiac Enzymes: Recent Labs  Lab 12/27/23 1717  CKTOTAL 221   BNP (last 3 results) No results for input(s): PROBNP in the last 8760 hours. HbA1C: No results for input(s): HGBA1C in the last 72 hours. CBG: No results for input(s): GLUCAP in the last 168 hours. Lipid Profile: No results for input(s): CHOL, HDL, LDLCALC, TRIG, CHOLHDL, LDLDIRECT in the last 72 hours. Thyroid  Function Tests: Recent Labs    12/27/23 1640  TSH 1.371   Anemia Panel: No results for input(s): VITAMINB12, FOLATE, FERRITIN, TIBC, IRON , RETICCTPCT in the last 72 hours. Sepsis Labs: Recent Labs  Lab 12/27/23 1640 12/27/23 1923  LATICACIDVEN 1.2 2.0*    Recent Results (from the past 240 hours)  Blood Culture (routine x 2)     Status: None (Preliminary result)   Collection Time: 12/27/23  5:17 PM   Specimen: BLOOD  Result Value Ref Range Status   Specimen Description BLOOD BLOOD RIGHT HAND  Final   Special Requests   Final    BOTTLES DRAWN AEROBIC ONLY Blood Culture results may not be optimal due to an inadequate volume of blood received in culture bottles   Culture   Final    NO GROWTH 3 DAYS Performed at [facility] [address], [place], [place] [address]    Report Status PENDING  Incomplete  Blood Culture (routine x 2)     Status: None (Preliminary result)   Collection Time: 12/27/23  5:17 PM   Specimen: BLOOD  Result Value Ref Range Status   Specimen Description BLOOD RIGHT SHOULDER  Final   Special Requests   Final    BOTTLES DRAWN AEROBIC ONLY Blood Culture results may not be optimal due to an inadequate volume of blood received in culture bottles   Culture   Final    NO GROWTH 3 DAYS Performed at [facility], [address], [place], [place] [address]    Report Status  PENDING  Incomplete  Resp panel by RT-PCR (RSV, Flu A&B, Covid) Anterior Nasal Swab  Status: None   Collection Time: 12/27/23  8:43 PM   Specimen: Anterior Nasal Swab  Result Value Ref Range Status   SARS Coronavirus 2 by RT PCR NEGATIVE NEGATIVE Final    Comment: (NOTE) SARS-CoV-2 target nucleic acids are NOT DETECTED.  The SARS-CoV-2 RNA is generally detectable in upper respiratory specimens during the acute phase of infection. The lowest concentration of SARS-CoV-2 viral copies this assay can detect is 138 copies/mL. A negative result does not preclude SARS-Cov-2 infection and should not be used as the sole basis for treatment or other patient management decisions. A negative result may occur with  improper specimen collection/handling, submission of specimen other than nasopharyngeal swab, presence of viral mutation(s) within the areas targeted by this assay, and inadequate number of viral copies(<138 copies/mL). A negative result must be combined with clinical observations, patient history, and epidemiological information. The expected result is Negative.  Fact Sheet for Patients:  bloggercourse.com  Fact Sheet for Healthcare Providers:  seriousbroker.it  This test is no t yet approved or cleared by the [place]  FDA and  has been authorized for detection and/or diagnosis of SARS-CoV-2 by FDA under an Emergency Use Authorization (EUA). This EUA will remain  in effect (meaning this test can be used) for the duration of the COVID-19 declaration under Section 564(b)(1) of the Act, 21 U.S.C.section 360bbb-3(b)(1), unless the authorization is terminated  or revoked sooner.       Influenza A by PCR NEGATIVE NEGATIVE Final   Influenza B by PCR NEGATIVE NEGATIVE Final    Comment: (NOTE) The Xpert Xpress SARS-CoV-2/FLU/RSV plus assay is intended as an aid in the diagnosis of influenza from Nasopharyngeal swab specimens  and should not be used as a sole basis for treatment. Nasal washings and aspirates are unacceptable for Xpert Xpress SARS-CoV-2/FLU/RSV testing.  Fact Sheet for Patients: bloggercourse.com  Fact Sheet for Healthcare Providers: seriousbroker.it  This test is not yet approved or cleared by the [place]  FDA and has been authorized for detection and/or diagnosis of SARS-CoV-2 by FDA under an Emergency Use Authorization (EUA). This EUA will remain in effect (meaning this test can be used) for the duration of the COVID-19 declaration under Section 564(b)(1) of the Act, 21 U.S.C. section 360bbb-3(b)(1), unless the authorization is terminated or revoked.     Resp Syncytial Virus by PCR NEGATIVE NEGATIVE Final    Comment: (NOTE) Fact Sheet for Patients: bloggercourse.com  Fact Sheet for Healthcare Providers: seriousbroker.it  This test is not yet approved or cleared by the [place]  FDA and has been authorized for detection and/or diagnosis of SARS-CoV-2 by FDA under an Emergency Use Authorization (EUA). This EUA will remain in effect (meaning this test can be used) for the duration of the COVID-19 declaration under Section 564(b)(1) of the Act, 21 U.S.C. section 360bbb-3(b)(1), unless the authorization is terminated or revoked.  Performed at [facility], [address], [place], [place] [address]   Urine Culture     Status: Abnormal   Collection Time: 12/28/23 12:17 AM   Specimen: Urine, Random  Result Value Ref Range Status   Specimen Description   Final    URINE, RANDOM Performed at [facility], [address] [place], [place] [address]    Special Requests   Final    NONE Reflexed from [id number] Performed at [facility], [address], [place], [place] [address]    Culture MULTIPLE SPECIES PRESENT, SUGGEST RECOLLECTION (A)  Final   Report Status 12/30/2023 FINAL   Final  Radiology Studies: No results found.       Scheduled Meds:  feeding supplement  237 mL Oral BID BM   melatonin  6 mg Oral QHS   pantoprazole   40 mg Oral Daily   senna-docusate  2 tablet Oral QPC supper   sodium chloride  flush  3 mL Intravenous Q12H     LOS: 3 days    Time spent: 35 minutes    Emera Bussie JONETTA Fairly, DO [facility]  If 7PM-7AM, please contact night-coverage [email or web address] 12/30/2023, 2:17 PM

## 2023-12-31 ENCOUNTER — Inpatient Hospital Stay: Payer: Medicare Other | Admitting: Hematology

## 2023-12-31 DIAGNOSIS — R579 Shock, unspecified: Secondary | ICD-10-CM | POA: Diagnosis not present

## 2023-12-31 DIAGNOSIS — Z515 Encounter for palliative care: Secondary | ICD-10-CM

## 2023-12-31 DIAGNOSIS — Z66 Do not resuscitate: Secondary | ICD-10-CM

## 2023-12-31 NOTE — Consult Note (Signed)
 Palliative Care Consult Note                                  Date: 12/31/2023   Patient Name: [name]  DOB: 13-Apr-1951  MRN: [id number]  Age / Sex: 73 y.o., male  PCP: Terry Wilhelmena Lloyd Hilario, FNP Referring Physician: Ricky Fines, MD  Reason for Consultation: Establishing goals of care  HPI/Patient Profile: 73 y.o. male  with past medical history of GERD, R-AKA, HLD, htn, PAD. Also recently dx poorly differentiated adenocarcinoma with unknown primary via Bx of soft tissue mass of abd, additional imaging with vertebral with possible epidural spread, diffuse bone, bilateral adrenal, and diffuse LAD and soft tissue / intramuscular metastases. He was seen by Oncology and planned to have outpatient evaluation by Dr. Rogers. Recent hospitalization at [facility] 12/2 -17 when he was initially found down, encephalopathic; Treated for shock requiring vasopressors, hypovolemia, scrotal cellulitis, rhabdomylolysis, hypercalcemia of maligancy, acute DVT of RUE and started on [facility].   Here he was admitted on 12/27/2023 with mixed shock, AKI on CKD, encephalopathy, and others.  PMT was consulted for GOC conversations.   Past Medical History:  Diagnosis Date  . Family history of metabolic acidosis with increased anion gap 03/05/2023  . GERD (gastroesophageal reflux disease)   . History of kidney stones   . Hx of AKA (above knee amputation), right (HCC)   . Hyperlipidemia   . Hypertension   . Metastatic disease (HCC)   . PAD (peripheral artery disease) (HCC)    S/p R CFA and PTA bypass 11/2022, critical limb ischemia s/p R AKA 02/2023  . Prostate cancer [facility]) 2011   radiation june 2020  . Septic shock (HCC) 11/26/2023  . Umbilical hernia   . Wears dentures    full  . Wears glasses   . Wears partial dentures    lower    Subjective:   This NP [name] reviewed medical records, received report from team, assessed the patient and  then meet at the patient's bedside to discuss diagnosis, prognosis, GOC, EOL wishes disposition and options.  I met with the patient at the bedside, no family was present.   We meet to discuss diagnosis prognosis, GOC, EOL wishes, disposition and options. Concept of Palliative Care was introduced as specialized medical care for people and their families living with serious illness.  If focuses on providing relief from the symptoms and stress of a serious illness.  The goal is to improve quality of life for both the patient and the family. Values and goals of care important to patient and family were attempted to be elicited.  Created space and opportunity for patient  and family to explore thoughts and feelings regarding current medical situation   Natural trajectory and current clinical status were discussed. Questions and concerns addressed. Patient  encouraged to call with questions or concerns.    Patient/Family Understanding of Illness: Deferred  Life Review: From prior palliative consult note: [name] shares that he is from [place], [place] . He is not married. He has one son who lives in [place] and 2 grandchildren.  [name] worked at [employer] throughout the duration of his career.  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  He is a member of the [employer].   Goals: DNR/DNI, comfort care, hospice evaluation (as per sister [name])  Today's Discussion:  Today when I saw the patient at the bedside, he was moaning, occasionally saying Ow and calling out for his mom. He did open his eyes to repeated vocal and tactile stimulation, did not make eye contact. Repeatedly attempted to get him to answer his name, tell me where he's hurting, but he cannot. Not meaningfully communicating at all. Did note some rhonchi and audible secretions.  Spoke with the patient's bedside nurse and explained my findings, requested a dose of pain  medication and Robinul  for secretions.  I later spoke with LCSW to explain patient's apparent decline. Was previously declined for residential hospice. Hospice was expected to come out today to re-evaluate but apparently they're closed today due to winter weather, will be out tomorrow around 11:00-11:30. I shared my opinion that the patient would likely not get approved for SNF/Rehab if he remained in this state. Hopefully with his decline he will meet criteria for inpatient hospice or GIP status.  I provided emotional and general support through therapeutic touch and patient reassurance of safety and comfort. I answered all questions and addressed all concerns to the best of my ability.  Review of Systems  Unable to perform ROS: Acuity of condition    Objective:   Primary Diagnoses: Present on Admission: . Shock [facility])   Physical Exam Vitals and nursing note reviewed.  Constitutional:      General: He is sleeping. He is in acute distress (appears in pain).     Appearance: He is ill-appearing.  HENT:     Head: Normocephalic and atraumatic.  Cardiovascular:     Rate and Rhythm: Normal rate.  Pulmonary:     Effort: Pulmonary effort is normal. No respiratory distress.     Breath sounds: Rhonchi present. No wheezing.  Abdominal:     General: Abdomen is flat. There is no distension.     Palpations: Abdomen is soft.  Skin:    General: Skin is warm and dry.  Neurological:     Mental Status: He is easily aroused. He is disoriented and confused.     Vital Signs:  BP 138/66 (BP Location: Right Wrist)   Pulse 89   Temp 97.6 F (36.4 C) (Oral)   Resp 19   Ht 5' 10 (1.778 m)   Wt 100.7 kg   SpO2 96%   BMI 31.85 kg/m   Palliative Assessment/Data: 10-20%    Advanced Care Planning:   Existing Vynca/ACP Documentation: None  Primary Decision Maker: NEXT OF KIN  Code Status/Advance Care Planning: DNR-Comfort  A discussion was had today regarding advanced directives.  Concepts specific to code status, artifical feeding and hydration, continued IV antibiotics and rehospitalization was had.  The difference between a aggressive medical intervention path and a palliative comfort care path for this patient at this time was had.   Decisions/Changes to ACP: None today  Assessment & Plan:   Impression: 73 year old male with acute presentation of chronic comorbidities as described above.  Today he is on comfort care, appears to have had a significant decline compared to medical notes from the past several days.  Hospice was supposed to evaluate him today for inpatient hospice as he was declined at the end of last week.  I feel that he is residential hospice appropriate at this time.  Hospice will be out tomorrow between 11-11:30 in the morning to reevaluate him.  Hopeful for inpatient placement versus GIP status.  Overall prognosis grave.  SUMMARY OF RECOMMENDATIONS   DNR-comfort Continue comfort care See symptom management  orders below Will await hospice reevaluation Palliative medicine will continue to follow daily for comfort care  Symptom Management:  Tylenol  1000 mg p.o. every 6 hours as needed mild pain Robinul  0.2 mg IV every 4 hours as needed excessive secretions Ativan  1 mg IV every 4 hours as needed anxiety Morphine  1 to 4 mg IV every 15 minutes as needed severe pain Zofran  4 mg IV every 6 hours as needed nausea or vomiting Oxycodone  2.5 mg p.o. every 6 hours as needed severe pain  Prognosis:  < 2 weeks  Discharge Planning:  TBD hopeful for inpatient hospice/GIP    Discussed with: Patient, medical team, nursing team    Thank you for allowing us  to participate in the care of Cordella JONELLE Marina PMT will continue to support holistically.  Time Total: 45 min  Detailed review of medical records (labs, imaging, vital signs), medically appropriate exam, discussed with treatment team, counseling and education to patient, family, & staff,  documenting clinical information, medication management, coordination of care  Signed by: [name], NP Palliative Medicine Team  Team Phone # [phone] (Nights/Weekends)  12/31/2023, 9:41 AM

## 2023-12-31 NOTE — TOC Progression Note (Signed)
 Transition of Care [facility]) - Progression Note    Patient Details  Name: [name] MRN: [id number] Date of Birth: 06-Feb-1951  Transition of Care [facility]) CM/SW Contact  Lucie Lunger, [place] Phone Number: 12/31/2023, 11:39 AM  Clinical Narrative:    CSW spoke to [facility] with Hospice who states that their office is closed today. [facility] states they will have a nurse come out to assess pt tomorrow between 11-11:30 to assess patient for [facility] placement. TOC to follow.   Expected Discharge Plan: Skilled Nursing Facility Barriers to Discharge: Continued Medical Work up  Expected Discharge Plan and Services In-house Referral: Clinical Social Work Discharge Planning Services: CM Consult Post Acute Care Choice: Skilled Nursing Facility Living arrangements for the past 2 months: Single Family Home                                       Social Determinants of Health (SDOH) Interventions SDOH Screenings   Food Insecurity: Patient Unable To Answer (12/28/2023)  Recent Concern: Food Insecurity - Food Insecurity Present (10/09/2023)  Housing: Patient Unable To Answer (12/28/2023)  Transportation Needs: Patient Unable To Answer (12/28/2023)  Utilities: Patient Unable To Answer (11/26/2023)  Depression (PHQ2-9): Low Risk  (10/10/2023)  Financial Resource Strain: Medium Risk (10/09/2023)  Physical Activity: Unknown (10/09/2023)  Social Connections: Unknown (12/28/2023)  Stress: No Stress Concern Present (10/09/2023)  Tobacco Use: Medium Risk (12/27/2023)    Readmission Risk Interventions    12/28/2023   11:16 AM  Readmission Risk Prevention Plan  Transportation Screening Complete  Medication Review (RN Care Manager) Complete  HRI or Home Care Consult Complete  SW Recovery Care/Counseling Consult Complete  Palliative Care Screening Complete  Skilled Nursing Facility Complete

## 2023-12-31 NOTE — Consult Note (Signed)
 [facility] Liaison Note  12/31/2023  SILVIANO NEUSER 12-07-1951 [id number]  Location: RN Hospital Liaison screened the patient remotely at [facility].  Insurance: [employer]   CURLY MACKOWSKI is a 73 y.o. male who is a Primary Care Patient of Del Wilhelmena Falter, Hilario, FNP The patient was screened for 30 day readmission hospitalization with noted extreme risk score for unplanned readmission risk with 2 IP/2 ED in 6 months.  The patient was assessed for potential Care Management service needs for post hospital transition for care coordination. Review of patient's electronic medical record reveals patient Altered Mental Status. If pt discharged to an unaffiliated VBCI facility. The facility will continue to address the pt's needs.  Plan: Pt pending evaluation with hospice for [facility] placement.   VBCI Care Management/Population Health does not replace or interfere with any arrangements made by the Inpatient Transition of Care team.   For questions contact:   Olam Ku, RN, [facility] Liaison [place]   [facility], Population Health Office Hours MTWF  8:00 am-6:00 pm Direct Dial: [phone] mobile [phone] [Office toll free line] Office Hours are M-F 8:30 - 5 pm [email or web address]

## 2023-12-31 NOTE — Progress Notes (Signed)
 PROGRESS NOTE    [name]  FMW:[id number] DOB: May 11, 1951 DOA: 12/27/2023 PCP: Terry Wilhelmena Lloyd Hilario, FNP   Brief Narrative:    [name] is a 73 y.o. male with recent hospitalization at [facility] from 12/2 -17 when he was initially found down, encephalopathic; Treated for shock requiring vasopressors, hypovolemia, scrotal cellulitis, rhabdomylolysis, hypercalcemia of maligancy, acute DVT of RUE and started on [facility]. During his evaluation found to have poorly differentiated adenocarcinoma with unknown primary via Bx of soft tissue mass of abd, additional imaging with vertebral with possible epidural spread, diffuse bone, bilateral adrenal, and diffuse LAD and soft tissue / intramuscular  metastases. He was seen by Oncology and planned to have outpatient evaluation by Dr. Rogers (scheduled for 1/6) to see if a candidate for immunotherapy. His overall prognosis is poor and he elected for change to DNR/DNI last admission. Other medical hx notable for: Hypertension, hyperlipidemia, CKD 3, PAD with right AKA, prostate cancer and prostatectomy.    He was brought in from [facility] today reportedly due to decreased intake, altered mental status and found to have tachycardia and hypotension.  His blood pressures have improved with PRBC transfusion.  Oncology recommends palliative consultation and hospice placement.  Hospice RN does not feel that he qualifies for inpatient placement at this point.  After further discussion with family members, he has been transition to comfort measures only on 1/5.  Assessment & Plan:   Principal Problem:   Shock (HCC)  Assessment and Plan:  Shock, suspect mixed hypovolemic, and distributive (AI v septic) On presentation hypotensive to 80s over 40s, sinus tachycardia in the 110s to 120s.  Placed on O2 but never hypoxic. Afebrile. WBC 16, lactate rising to 2.  Flu/COVID/RSV negative.  Chest x-ray with new right pleural effusion but no infiltrates.  With history decreased intake suspected hypovolemic however his blood pressure has marginally improved after 3 L IV fluid.  Otherwise known history of bilateral adrenal mets making adrenal insufficiency is strong possibility.  May have sepsis although no localizing symptoms, still waiting on urinalysis.  - Discontinued medications aside from comfort care   Acute kidney injury stage I Background CKD 3 Baseline creatinine approximately 0.9, elevated to 1.6 on admission.  No urine output despite large-volume fluid resuscitation.  Suspect prerenal +/- urinary retention   Encephalopathy, acute possibly related to UTI-improving Noted to have disorientation, impaired attention and memory.  Most likely delirium in the setting of underlying illness.  Likely multifactorial with hypoperfusion/shock, possible underlying infection, known malignancy, sedating meds (Flexeril , gabapentin , oxybutynin , oxycodone ). Reports fall + recently started on [facility], will r/o ICH  - CT head negative for ICH   Recently diagnosed metastatic poorly differentiated adenocarcinoma with unknown primary Mets to vertebral with possible epidural spread, diffuse bone, bilateral adrenal, and diffuse LAD and soft tissue / intramuscular areas. - Discussed case with Dr. Katragadda and patient is not a candidate for any form of therapy at this time.  Palliative consulted and recommend hospice care.   Goals of care - Currently DNR/DNI with out of hospital durable DNR - Hospice referral approved by sister   Deconditioning  - Plan for discharge back to SNF with hospice services  *Continue full comfort care and symptomatic management only -Pending evaluation by hospice for acceptance and admission to hospice facility to continue end-of-life care.     DVT prophylaxis: None Code Status: DNR-comfort care Family Communication: No family at bedside; on 12/30/2023 Case was discussed by Dr. Larae with patient's  sister who is in agreement with hospice  placement. Disposition Plan:  Status is: Inpatient Remains inpatient appropriate because: Need for IV medications.   Consultants:  Oncology-discussed with Dr. Rogers Palliative  Procedures:  None  Antimicrobials:  Anti-infectives (From admission, onward)    Start     Dose/Rate Route Frequency Ordered Stop   12/30/23 2200  vancomycin  (VANCOREADY) IVPB 1500 mg/300 mL  Status:  Discontinued        1,500 mg 150 mL/hr over 120 Minutes Intravenous Every 24 hours 12/30/23 1135 12/30/23 1416   12/30/23 1400  ceFEPIme  (MAXIPIME ) 2 g in sodium chloride  0.9 % 100 mL IVPB  Status:  Discontinued        2 g 200 mL/hr over 30 Minutes Intravenous Every 8 hours 12/30/23 1135 12/30/23 1416   12/28/23 0500  ceFEPIme  (MAXIPIME ) 2 g in sodium chloride  0.9 % 100 mL IVPB  Status:  Discontinued        2 g 200 mL/hr over 30 Minutes Intravenous Every 12 hours 12/27/23 2238 12/30/23 1135   12/28/23 0500  metroNIDAZOLE  (FLAGYL ) tablet 500 mg  Status:  Discontinued        500 mg Oral Every 12 hours 12/27/23 2238 12/30/23 1416   12/27/23 2327  vancomycin  (VANCOCIN ) IVPB 1000 mg/200 mL premix  Status:  Discontinued        1,000 mg 200 mL/hr over 60 Minutes Intravenous Every 24 hours 12/27/23 2328 12/30/23 1135   12/27/23 1700  ceFEPIme  (MAXIPIME ) 2 g in sodium chloride  0.9 % 100 mL IVPB        2 g 200 mL/hr over 30 Minutes Intravenous  Once 12/27/23 1648 12/27/23 1757   12/27/23 1700  metroNIDAZOLE  (FLAGYL ) IVPB 500 mg        500 mg 100 mL/hr over 60 Minutes Intravenous  Once 12/27/23 1648 12/27/23 1854   12/27/23 1700  vancomycin  (VANCOCIN ) IVPB 1000 mg/200 mL premix  Status:  Discontinued        1,000 mg 200 mL/hr over 60 Minutes Intravenous  Once 12/27/23 1648 12/27/23 1651   12/27/23 1700  vancomycin  (VANCOCIN ) IVPB 1000 mg/200 mL premix        1,000 mg 200 mL/hr over 60 Minutes Intravenous Every 1 hr x 2 12/27/23 1651 12/27/23 1955      Subjective: No overnight events; patient demonstrating  minimal oral intake.  Appears calm and comfortable.  Objective: Vitals:   12/29/23 2028 12/30/23 0514 12/30/23 1226 12/30/23 2020  BP: (!) 132/119 123/67 (!) 159/87 138/66  Pulse: 88 62  89  Resp: 18 20 18 19   Temp: 97.8 F (36.6 C) 98.2 F (36.8 C) 97.7 F (36.5 C) 97.6 F (36.4 C)  TempSrc: Oral  Oral Oral  SpO2: 92% 94% 96% 96%  Weight:      Height:        Intake/Output Summary (Last 24 hours) at 12/31/2023 1751 Last data filed at 12/31/2023 1300 Gross per 24 hour  Intake 0 ml  Output 200 ml  Net -200 ml   Filed Weights   12/27/23 1620 12/28/23 1700  Weight: 86.2 kg 100.7 kg    Examination: General exam: Calm and comfortable; minimal oral intake.  Afebrile. Respiratory system: Good saturation on room air.  Normal respiratory effort. Cardiovascular system: S1 and S2; no rubs or gallops. Gastrointestinal system: Abdomen is obese, nondistended, and soft.  Positive bowel sounds appreciated. Central nervous system: Alert and oriented. No focal neurological deficits. Extremities: No cyanosis or clubbing; right BKA. Skin: No  petechiae. Psychiatry: Flat affect appreciated on exam.    Data Reviewed: I have personally reviewed following labs and imaging studies  CBC: Recent Labs  Lab 12/27/23 1640 12/28/23 0311 12/29/23 0525 12/30/23 0350  WBC 16.2* 12.9* 14.3* 17.3*  NEUTROABS 13.5*  --   --   --   HGB 7.5* 5.2* 8.3* 8.7*  HCT 24.2* 16.9* 24.9* 27.1*  MCV 90.3 91.8 87.1 86.9  PLT 219 155 141* 136*   Basic Metabolic Panel: Recent Labs  Lab 12/27/23 1640 12/27/23 1717 12/28/23 0311 12/29/23 0525 12/30/23 0350  NA 132*  --  132* 133* 133*  K 4.7  --  4.2 3.9 3.5  CL 100  --  102 104 103  CO2 19*  --  18* 18* 20*  GLUCOSE 80  --  105* 165* 171*  BUN 50*  --  44* 45* 44*  CREATININE 1.67*  --  1.34* 1.11 1.01  CALCIUM  7.8*  --  7.2* 7.3* 7.4*  MG  --   --  1.9 1.9 1.8  PHOS  --  4.6 3.6  --   --    GFR: Estimated Creatinine Clearance: 77.5 mL/min (by  C-G formula based on SCr of 1.01 mg/dL).  Liver Function Tests: Recent Labs  Lab 12/27/23 1640 12/29/23 0525  AST 56* 56*  ALT 33 28  ALKPHOS 132* 87  BILITOT 0.8 0.9  PROT 6.6 5.0*  ALBUMIN  1.7* 1.6*   Coagulation Profile: Recent Labs  Lab 12/27/23 1717  INR 2.8*   Cardiac Enzymes: Recent Labs  Lab 12/27/23 1717  CKTOTAL 221   Sepsis Labs: Recent Labs  Lab 12/27/23 1640 12/27/23 1923  LATICACIDVEN 1.2 2.0*    Recent Results (from the past 240 hours)  Blood Culture (routine x 2)     Status: None (Preliminary result)   Collection Time: 12/27/23  5:17 PM   Specimen: BLOOD  Result Value Ref Range Status   Specimen Description BLOOD BLOOD RIGHT HAND  Final   Special Requests   Final    BOTTLES DRAWN AEROBIC ONLY Blood Culture results may not be optimal due to an inadequate volume of blood received in culture bottles   Culture   Final    NO GROWTH 4 DAYS Performed at [facility], [address] [place], [place] [address]    Report Status PENDING  Incomplete  Blood Culture (routine x 2)     Status: None (Preliminary result)   Collection Time: 12/27/23  5:17 PM   Specimen: BLOOD  Result Value Ref Range Status   Specimen Description BLOOD RIGHT SHOULDER  Final   Special Requests   Final    BOTTLES DRAWN AEROBIC ONLY Blood Culture results may not be optimal due to an inadequate volume of blood received in culture bottles   Culture   Final    NO GROWTH 4 DAYS Performed at [facility], [address] [place], [place] [address]    Report Status PENDING  Incomplete  Resp panel by RT-PCR (RSV, Flu A&B, Covid) Anterior Nasal Swab     Status: None   Collection Time: 12/27/23  8:43 PM   Specimen: Anterior Nasal Swab  Result Value Ref Range Status   SARS Coronavirus 2 by RT PCR NEGATIVE NEGATIVE Final    Comment: (NOTE) SARS-CoV-2 target nucleic acids are NOT DETECTED.  The SARS-CoV-2 RNA is generally detectable in upper respiratory specimens during the acute phase  of infection. The lowest concentration of SARS-CoV-2 viral copies this assay can detect is  138 copies/mL. A negative result does not preclude SARS-Cov-2 infection and should not be used as the sole basis for treatment or other patient management decisions. A negative result may occur with  improper specimen collection/handling, submission of specimen other than nasopharyngeal swab, presence of viral mutation(s) within the areas targeted by this assay, and inadequate number of viral copies(<138 copies/mL). A negative result must be combined with clinical observations, patient history, and epidemiological information. The expected result is Negative.  Fact Sheet for Patients:  bloggercourse.com  Fact Sheet for Healthcare Providers:  seriousbroker.it  This test is no t yet approved or cleared by the [place]  FDA and  has been authorized for detection and/or diagnosis of SARS-CoV-2 by FDA under an Emergency Use Authorization (EUA). This EUA will remain  in effect (meaning this test can be used) for the duration of the COVID-19 declaration under Section 564(b)(1) of the Act, 21 U.S.C.section 360bbb-3(b)(1), unless the authorization is terminated  or revoked sooner.       Influenza A by PCR NEGATIVE NEGATIVE Final   Influenza B by PCR NEGATIVE NEGATIVE Final    Comment: (NOTE) The Xpert Xpress SARS-CoV-2/FLU/RSV plus assay is intended as an aid in the diagnosis of influenza from Nasopharyngeal swab specimens and should not be used as a sole basis for treatment. Nasal washings and aspirates are unacceptable for Xpert Xpress SARS-CoV-2/FLU/RSV testing.  Fact Sheet for Patients: bloggercourse.com  Fact Sheet for Healthcare Providers: seriousbroker.it  This test is not yet approved or cleared by the [place]  FDA and has been authorized for detection and/or diagnosis of  SARS-CoV-2 by FDA under an Emergency Use Authorization (EUA). This EUA will remain in effect (meaning this test can be used) for the duration of the COVID-19 declaration under Section 564(b)(1) of the Act, 21 U.S.C. section 360bbb-3(b)(1), unless the authorization is terminated or revoked.     Resp Syncytial Virus by PCR NEGATIVE NEGATIVE Final    Comment: (NOTE) Fact Sheet for Patients: bloggercourse.com  Fact Sheet for Healthcare Providers: seriousbroker.it  This test is not yet approved or cleared by the [place]  FDA and has been authorized for detection and/or diagnosis of SARS-CoV-2 by FDA under an Emergency Use Authorization (EUA). This EUA will remain in effect (meaning this test can be used) for the duration of the COVID-19 declaration under Section 564(b)(1) of the Act, 21 U.S.C. section 360bbb-3(b)(1), unless the authorization is terminated or revoked.  Performed at [facility], [address] [place], [place] [address]   Urine Culture     Status: Abnormal   Collection Time: 12/28/23 12:17 AM   Specimen: Urine, Random  Result Value Ref Range Status   Specimen Description   Final    URINE, RANDOM Performed at [facility], [address] [place], [place] [address]    Special Requests   Final    NONE Reflexed from [id number] Performed at [facility], [address] [place], [place] [address]    Culture MULTIPLE SPECIES PRESENT, SUGGEST RECOLLECTION (A)  Final   Report Status 12/30/2023 FINAL  Final     Scheduled Meds:  feeding supplement  237 mL Oral BID BM   melatonin  6 mg Oral QHS   pantoprazole   40 mg Oral Daily   senna-docusate  2 tablet Oral QPC supper   sodium chloride  flush  3 mL Intravenous Q12H     LOS: 4 days    Time spent: 35 minutes    Eric Nunnery, MD [facility]  If 7PM-7AM, please  contact night-coverage [email or web address] 12/31/2023, 5:51 PM

## 2023-12-31 NOTE — Progress Notes (Signed)
 OT Cancellation Note  Patient Details Name: [name] MRN: [id number] DOB: 12-Aug-1951   Cancelled Treatment:    Reason Eval/Treat Not Completed: OT screened, no needs identified, will sign off. Family and MD are seeking hospice referral and pt has transitioned to comfort care. He has no further skilled OT needs and acute OT will discharge to nursing for comfort management. Thank you for the referral.   Valentin Nightingale, OTR/L [facility] Annayah Worthley Elane Nightingale 12/31/2023, 9:46 AM

## 2024-01-01 ENCOUNTER — Other Ambulatory Visit: Payer: Self-pay | Admitting: Family Medicine

## 2024-01-01 DIAGNOSIS — R4182 Altered mental status, unspecified: Secondary | ICD-10-CM

## 2024-01-01 DIAGNOSIS — N179 Acute kidney failure, unspecified: Secondary | ICD-10-CM

## 2024-01-01 DIAGNOSIS — A419 Sepsis, unspecified organism: Secondary | ICD-10-CM

## 2024-01-01 DIAGNOSIS — R579 Shock, unspecified: Secondary | ICD-10-CM | POA: Diagnosis not present

## 2024-01-01 DIAGNOSIS — I959 Hypotension, unspecified: Secondary | ICD-10-CM

## 2024-01-01 DIAGNOSIS — Z7189 Other specified counseling: Secondary | ICD-10-CM

## 2024-01-01 DIAGNOSIS — E860 Dehydration: Secondary | ICD-10-CM

## 2024-01-01 LAB — CULTURE, BLOOD (ROUTINE X 2)
Culture: NO GROWTH
Culture: NO GROWTH

## 2024-01-01 MED ORDER — GLYCOPYRROLATE 1 MG PO TABS: 1.0000 mg | ORAL_TABLET | ORAL | Status: AC | PRN

## 2024-01-01 MED ORDER — LORAZEPAM 1 MG PO TABS: 1.0000 mg | ORAL_TABLET | ORAL | Status: AC | PRN

## 2024-01-01 MED ORDER — PANTOPRAZOLE SODIUM 40 MG PO TBEC: 40.0000 mg | DELAYED_RELEASE_TABLET | Freq: Every day | ORAL | Status: AC

## 2024-01-01 MED ORDER — OXYCODONE HCL 5 MG PO TABS: 2.5000 mg | ORAL_TABLET | Freq: Four times a day (QID) | ORAL | Status: AC | PRN

## 2024-01-01 MED ORDER — MELATONIN 3 MG PO TABS: 6.0000 mg | ORAL_TABLET | Freq: Every day | ORAL | Status: AC

## 2024-01-01 NOTE — TOC Transition Note (Signed)
 Transition of Care [facility]) - Discharge Note   Patient Details  Name: [name] MRN: [id number] Date of Birth: 01/01/1951  [facility] CM/SW Contact:  [name] Petri, LCSW Phone Number: 01/01/2024, 1:47 PM   Clinical Narrative:     TOC following. [facility] re-evaluated pt for [facility] house today and he has been accepted for admission. Pt's sister aware and has completed admission paperwork with [facility].   [facility] team arranged transport with EMS and ETA for pickup is between 330-530pm. Updated RN who will call report.  No other TOC needs for dc.  Final next level of care: Hospice Medical Facility Barriers to Discharge: Barriers Resolved   Patient Goals and CMS Choice Patient states their goals for this hospitalization and ongoing recovery are:: get better CMS Medicare.gov Compare Post Acute Care list provided to:: Patient Choice offered to / list presented to : Patient      Discharge Placement                       Discharge Plan and Services Additional resources added to the After Visit Summary for   In-house Referral: Clinical Social Work Discharge Planning Services: CM Consult Post Acute Care Choice: Skilled Nursing Facility                               Social Drivers of Health (SDOH) Interventions SDOH Screenings   Food Insecurity: Patient Unable To Answer (12/28/2023)  Recent Concern: Food Insecurity - Food Insecurity Present (10/09/2023)  Housing: Patient Unable To Answer (12/28/2023)  Transportation Needs: Patient Unable To Answer (12/28/2023)  Utilities: Patient Unable To Answer (11/26/2023)  Depression (PHQ2-9): Low Risk  (10/10/2023)  Financial Resource Strain: Medium Risk (10/09/2023)  Physical Activity: Unknown (10/09/2023)  Social Connections: Unknown (12/28/2023)  Stress: No Stress Concern Present (10/09/2023)  Tobacco Use: Medium Risk (12/27/2023)     Readmission Risk Interventions    12/28/2023   11:16  AM  Readmission Risk Prevention Plan  Transportation Screening Complete  Medication Review (RN Care Manager) Complete  HRI or Home Care Consult Complete  SW Recovery Care/Counseling Consult Complete  Palliative Care Screening Complete  Skilled Nursing Facility Complete

## 2024-01-01 NOTE — Discharge Summary (Signed)
 Physician Discharge Summary   Patient: [name] MRN: [id number] DOB: September 10, 1951  Admit date:     12/27/2023  Discharge date: 01/01/24  Discharge Physician: Eric Nunnery   PCP: Terry Wilhelmena Lloyd Hilario, FNP   Recommendations at discharge:  Symptomatic management and end-of-life care.  Discharge Diagnoses: Principal Problem:   Shock (HCC) Active Problems:   Dehydration   Hypotension   Altered mental status Stage II pressure injury in his buttocks and sacrum present at time of admission Mild hyponatremia.   Brief Narrative:    [name] is a 73 y.o. male with recent hospitalization at [facility] from 12/2 -17 when he was initially found down, encephalopathic; Treated for shock requiring vasopressors, hypovolemia, scrotal cellulitis, rhabdomylolysis, hypercalcemia of maligancy, acute DVT of RUE and started on [facility]. During his evaluation found to have poorly differentiated adenocarcinoma with unknown primary via Bx of soft tissue mass of abd, additional imaging with vertebral with possible epidural spread, diffuse bone, bilateral adrenal, and diffuse LAD and soft tissue / intramuscular  metastases. He was seen by Oncology and planned to have outpatient evaluation by Dr. Rogers (scheduled for 1/6) to see if a candidate for immunotherapy. His overall prognosis is poor and he elected for change to DNR/DNI last admission. Other medical hx notable for: Hypertension, hyperlipidemia, CKD 3, PAD with right AKA, prostate cancer and prostatectomy.    He was brought in from [facility] today reportedly due to decreased intake, altered mental status and found to have tachycardia and hypotension.  His blood pressures have improved with PRBC transfusion.  Oncology recommends palliative consultation and hospice placement.  Hospice RN does not feel that he qualifies for inpatient placement at this point.  After further discussion with family members, he has been transition to comfort  measures only on 1/5.  Assessment and Plan: Shock, suspect mixed hypovolemic, and distributive (AI v septic) On presentation hypotensive to 80s over 40s, sinus tachycardia in the 110s to 120s.  Placed on O2 but never hypoxic. Afebrile. WBC 16, lactate rising to 2.  Flu/COVID/RSV negative.  Chest x-ray with new right pleural effusion but no infiltrates. With history decreased intake suspected hypovolemic however his blood pressure has marginally improved after 3 L IV fluid.  Otherwise known history of bilateral adrenal mets making adrenal insufficiency is strong possibility.  May have sepsis although no localizing symptoms, still waiting on urinalysis.  - Discontinued medications aside from comfort care  Dehydration and mild hyponatremia -Present at time of admission -Care transition to full comfort -No further blood work or analysis will be followed. -Continue symptomatic management.  Acute kidney injury stage I Background CKD 3 Baseline creatinine approximately 0.9, elevated to 1.6 on admission.  No urine output despite large-volume fluid resuscitation.  Suspect prerenal +/- urinary retention   Encephalopathy, acute possibly related to UTI-improving Noted to have disorientation, impaired attention and memory.  Most likely delirium in the setting of underlying illness.  Likely multifactorial with hypoperfusion/shock, possible underlying infection, known malignancy, sedating meds (Flexeril , gabapentin , oxybutynin , oxycodone ). Reports fall + recently started on [facility], will r/o ICH  - CT head negative for ICH  Stage II bilateral buttocks and sacrum pressure injury -Present at time of admission -No signs of superimposed infection appreciated -Continue local care and constant repositioning.   Recently diagnosed metastatic poorly differentiated adenocarcinoma with unknown primary Mets to vertebral with possible epidural spread, diffuse bone, bilateral adrenal, and diffuse LAD and soft tissue /  intramuscular areas. - Discussed case with Dr.  Katragadda and patient is not a candidate for any form of therapy at this time.  Palliative consulted and recommend hospice care.   Goals of care - Currently DNR/DNI with out of hospital durable DNR - Hospice referral approved by sister   Deconditioning  - Plan for discharge back to SNF with hospice services   *Continue full comfort care and symptomatic management only -Patient has been seen by hospice nurse and [facility] for inpatient hospice care.  Will transfer to [facility] house for further symptomatic management and end-of-life care. -Anticipated life expectancy less than 3 weeks.   Consultants: Palliative care and hospice. Procedures performed: See below for x-ray reports. Disposition: Inpatient hospice. Diet recommendation: Comfort feeding.  DISCHARGE MEDICATION: Allergies as of 01/01/2024       Reactions   Shellfish Allergy Swelling   Sulfa Antibiotics Other (See Comments)   Listed in allergies on MAR from facility Unknown reaction        Medication List     STOP taking these medications    AMINO ACIDS-PROTEIN HYDROLYS PO   apixaban  5 MG Tabs tablet Commonly known as: ELIQUIS    ascorbic acid 500 MG tablet Commonly known as: VITAMIN C   atorvastatin  40 MG tablet Commonly known as: LIPITOR   benzonatate  200 MG capsule Commonly known as: TESSALON    CENTRUM SILVER PO   cyclobenzaprine  5 MG tablet Commonly known as: FLEXERIL    FeroSul 325 (65 FE) MG tablet Generic drug: ferrous sulfate    gabapentin  100 MG capsule Commonly known as: Neurontin    gabapentin  300 MG capsule Commonly known as: NEURONTIN    levocetirizine 5 MG tablet Commonly known as: XYZAL    NORMAL SALINE FLUSH IV   oxybutynin  10 MG 24 hr tablet Commonly known as: DITROPAN -XL   sodium bicarbonate  650 MG tablet   zinc sulfate (50mg  elemental zinc) 220 (50 Zn) MG capsule       TAKE these medications    acetaminophen  325 MG  tablet Commonly known as: TYLENOL  Take 1-2 tablets (325-650 mg total) by mouth every 4 (four) hours as needed for mild pain. What changed: how much to take   albuterol  108 (90 Base) MCG/ACT inhaler Commonly known as: VENTOLIN  HFA Inhale 2 puffs into the lungs every 6 (six) hours as needed for wheezing or shortness of breath.   glycopyrrolate  1 MG tablet Commonly known as: ROBINUL  Take 1 tablet (1 mg total) by mouth every 4 (four) hours as needed (excessive secretions).   LORazepam  1 MG tablet Commonly known as: ATIVAN  Take 1 tablet (1 mg total) by mouth every 4 (four) hours as needed for anxiety.   melatonin 3 MG Tabs tablet Take 2 tablets (6 mg total) by mouth at bedtime. What changed:  how much to take when to take this reasons to take this   oxyCODONE  5 MG immediate release tablet Commonly known as: Oxy IR/ROXICODONE  Take 0.5 tablets (2.5 mg total) by mouth every 6 (six) hours as needed for severe pain (pain score 7-10). What changed: how much to take   pantoprazole  40 MG tablet Commonly known as: PROTONIX  Take 1 tablet (40 mg total) by mouth daily. Start taking on: 01/04/24 What changed:  medication strength how much to take   polyethylene glycol powder 17 GM/SCOOP powder Commonly known as: MiraLax  Take 17 g by mouth daily as needed for mild constipation.   senna-docusate 8.6-50 MG tablet Commonly known as: Senokot-S Take 2 tablets by mouth daily after supper.  Discharge Care Instructions  (From admission, onward)           Start     Ordered   01/01/24 0000  Discharge wound care:       Comments: Stage II bilateral buttocks and sacrum on pressure injury present at time of admission; continue constant repositioning and local care for preventative measures.   01/01/24 1320            Discharge Exam: Filed Weights   12/27/23 1620 12/28/23 1700  Weight: 86.2 kg 100.7 kg   General exam: Calm and comfortable; minimal to none  oral intake.  Afebrile.  No overnight events.  Blood Respiratory system: Good saturation on room air.  Normal respiratory effort. Cardiovascular system: S1 and S2; no rubs or gallops. Gastrointestinal system: Abdomen is obese, nondistended, and soft.  Positive bowel sounds appreciated. Central nervous system: Alert and oriented. No focal neurological deficits. Extremities: No cyanosis or clubbing; right BKA. Skin: No petechiae. Psychiatry: Flat affect appreciated on exam.  Condition at discharge: stable  The results of significant diagnostics from this hospitalization (including imaging, microbiology, ancillary and laboratory) are listed below for reference.   Imaging Studies: CT HEAD WO CONTRAST ( ) Result Date: 12/28/2023 CLINICAL DATA:  Encephalopathy (Ped 0-17y) AMS, recently started on [facility], unknown if falls. r/o ICH EXAM: CT HEAD WITHOUT CONTRAST TECHNIQUE: Contiguous axial images were obtained from the base of the skull through the vertex without intravenous contrast. RADIATION DOSE REDUCTION: This exam was performed according to the departmental dose-optimization program which includes automated exposure control, adjustment of the mA and/or kV according to patient size and/or use of iterative reconstruction technique. COMPARISON:  CT head 11/26/2023 FINDINGS: Brain: No evidence of large-territorial acute infarction. No parenchymal hemorrhage. No mass lesion. No extra-axial collection. No mass effect or midline shift. No hydrocephalus. Basilar cisterns are patent. Vascular: No hyperdense vessel. Atherosclerotic calcifications are present within the cavernous internal carotid arteries. Skull: No acute fracture or focal lesion. Sinuses/Orbits: Paranasal sinuses and mastoid air cells are clear. The orbits are unremarkable. Other: None. IMPRESSION: No acute intracranial abnormality. Electronically Signed   By: Morgane  Naveau M.D.   On: 12/28/2023 00:06   DG Chest Port 1 View Result Date:  12/27/2023 CLINICAL DATA:  Possible sepsis EXAM: PORTABLE CHEST 1 VIEW COMPARISON:  11/12/2023 FINDINGS: Check shadow is stable. Aortic calcifications are noted. Left lung is clear. Right-sided effusion is noted new from the prior exam. No bony abnormality is noted. IMPRESSION: New right pleural effusion. Electronically Signed   By: Oneil Devonshire M.D.   On: 12/27/2023 19:27    Microbiology: Results for orders placed or performed during the hospital encounter of 12/27/23  Blood Culture (routine x 2)     Status: None   Collection Time: 12/27/23  5:17 PM   Specimen: BLOOD  Result Value Ref Range Status   Specimen Description BLOOD BLOOD RIGHT HAND  Final   Special Requests   Final    BOTTLES DRAWN AEROBIC ONLY Blood Culture results may not be optimal due to an inadequate volume of blood received in culture bottles   Culture   Final    NO GROWTH 5 DAYS Performed at [facility], [address] [place], [place] [address]    Report Status 01/01/2024 FINAL  Final  Blood Culture (routine x 2)     Status: None   Collection Time: 12/27/23  5:17 PM   Specimen: BLOOD  Result Value Ref Range Status   Specimen Description BLOOD RIGHT SHOULDER  Final   Special Requests   Final    BOTTLES DRAWN AEROBIC ONLY Blood Culture results may not be optimal due to an inadequate volume of blood received in culture bottles   Culture   Final    NO GROWTH 5 DAYS Performed at [facility], [address] [place], [place] [address]    Report Status 01/01/2024 FINAL  Final  Resp panel by RT-PCR (RSV, Flu A&B, Covid) Anterior Nasal Swab     Status: None   Collection Time: 12/27/23  8:43 PM   Specimen: Anterior Nasal Swab  Result Value Ref Range Status   SARS Coronavirus 2 by RT PCR NEGATIVE NEGATIVE Final    Comment: (NOTE) SARS-CoV-2 target nucleic acids are NOT DETECTED.  The SARS-CoV-2 RNA is generally detectable in upper respiratory specimens during the acute phase of infection. The lowest concentration of  SARS-CoV-2 viral copies this assay can detect is 138 copies/mL. A negative result does not preclude SARS-Cov-2 infection and should not be used as the sole basis for treatment or other patient management decisions. A negative result may occur with  improper specimen collection/handling, submission of specimen other than nasopharyngeal swab, presence of viral mutation(s) within the areas targeted by this assay, and inadequate number of viral copies(<138 copies/mL). A negative result must be combined with clinical observations, patient history, and epidemiological information. The expected result is Negative.  Fact Sheet for Patients:  bloggercourse.com  Fact Sheet for Healthcare Providers:  seriousbroker.it  This test is no t yet approved or cleared by the [place]  FDA and  has been authorized for detection and/or diagnosis of SARS-CoV-2 by FDA under an Emergency Use Authorization (EUA). This EUA will remain  in effect (meaning this test can be used) for the duration of the COVID-19 declaration under Section 564(b)(1) of the Act, 21 U.S.C.section 360bbb-3(b)(1), unless the authorization is terminated  or revoked sooner.       Influenza A by PCR NEGATIVE NEGATIVE Final   Influenza B by PCR NEGATIVE NEGATIVE Final    Comment: (NOTE) The Xpert Xpress SARS-CoV-2/FLU/RSV plus assay is intended as an aid in the diagnosis of influenza from Nasopharyngeal swab specimens and should not be used as a sole basis for treatment. Nasal washings and aspirates are unacceptable for Xpert Xpress SARS-CoV-2/FLU/RSV testing.  Fact Sheet for Patients: bloggercourse.com  Fact Sheet for Healthcare Providers: seriousbroker.it  This test is not yet approved or cleared by the [place]  FDA and has been authorized for detection and/or diagnosis of SARS-CoV-2 by FDA under an Emergency Use  Authorization (EUA). This EUA will remain in effect (meaning this test can be used) for the duration of the COVID-19 declaration under Section 564(b)(1) of the Act, 21 U.S.C. section 360bbb-3(b)(1), unless the authorization is terminated or revoked.     Resp Syncytial Virus by PCR NEGATIVE NEGATIVE Final    Comment: (NOTE) Fact Sheet for Patients: bloggercourse.com  Fact Sheet for Healthcare Providers: seriousbroker.it  This test is not yet approved or cleared by the [place]  FDA and has been authorized for detection and/or diagnosis of SARS-CoV-2 by FDA under an Emergency Use Authorization (EUA). This EUA will remain in effect (meaning this test can be used) for the duration of the COVID-19 declaration under Section 564(b)(1) of the Act, 21 U.S.C. section 360bbb-3(b)(1), unless the authorization is terminated or revoked.  Performed at [facility], [address], [place], [place] [address]   Urine Culture     Status: Abnormal   Collection Time: 12/28/23 12:17  AM   Specimen: Urine, Random  Result Value Ref Range Status   Specimen Description   Final    URINE, RANDOM Performed at [facility], [address], [place], [place] [address]    Special Requests   Final    NONE Reflexed from [id number] Performed at [facility], [address] [place], [place] [address]    Culture MULTIPLE SPECIES PRESENT, SUGGEST RECOLLECTION (A)  Final   Report Status 12/30/2023 FINAL  Final    Labs: CBC: Recent Labs  Lab 12/27/23 1640 12/28/23 0311 12/29/23 0525 12/30/23 0350  WBC 16.2* 12.9* 14.3* 17.3*  NEUTROABS 13.5*  --   --   --   HGB 7.5* 5.2* 8.3* 8.7*  HCT 24.2* 16.9* 24.9* 27.1*  MCV 90.3 91.8 87.1 86.9  PLT 219 155 141* 136*   Basic Metabolic Panel: Recent Labs  Lab 12/27/23 1640 12/27/23 1717 12/28/23 0311 12/29/23 0525 12/30/23 0350  NA 132*  --  132* 133* 133*  K 4.7  --  4.2 3.9 3.5  CL 100  --  102 104 103   CO2 19*  --  18* 18* 20*  GLUCOSE 80  --  105* 165* 171*  BUN 50*  --  44* 45* 44*  CREATININE 1.67*  --  1.34* 1.11 1.01  CALCIUM  7.8*  --  7.2* 7.3* 7.4*  MG  --   --  1.9 1.9 1.8  PHOS  --  4.6 3.6  --   --    Liver Function Tests: Recent Labs  Lab 12/27/23 1640 12/29/23 0525  AST 56* 56*  ALT 33 28  ALKPHOS 132* 87  BILITOT 0.8 0.9  PROT 6.6 5.0*  ALBUMIN  1.7* 1.6*   CBG: No results for input(s): GLUCAP in the last 168 hours.  Discharge time spent: greater than 30 minutes.  Signed: Eric Nunnery, MD [facility] 01/01/2024

## 2024-01-01 NOTE — Progress Notes (Signed)
 EMS transferred patient to Hospice.

## 2024-01-01 NOTE — Progress Notes (Signed)
 Nutrition Brief Note  Chart reviewed. Patient has transitioned to comfort care.  No further nutrition interventions planned at this time.  Please re-consult as needed.   Suzen HUNT RD, LDN, CNSC Contact Inpatient RD using Secure Chat. If unavailable, use group chat RD Inpatient via Secure Chat in EPIC.

## 2024-01-01 NOTE — Plan of Care (Signed)
  Problem: Clinical Measurements: Goal: Respiratory complications will improve Outcome: Progressing   Problem: Health Behavior/Discharge Planning: Goal: Ability to manage health-related needs will improve Outcome: Not Progressing   Problem: Clinical Measurements: Goal: Cardiovascular complication will be avoided Outcome: Not Progressing   Problem: Activity: Goal: Risk for activity intolerance will decrease Outcome: Not Progressing   Problem: Nutrition: Goal: Adequate nutrition will be maintained Outcome: Not Progressing   Problem: Coping: Goal: Level of anxiety will decrease Outcome: Not Progressing

## 2024-01-01 NOTE — Progress Notes (Signed)
 Daily Progress Note   Patient Name: [name]       Date: 01/01/2024 DOB: 1951/08/28  Age: 73 y.o. MRN#: [id number] Attending Physician: Ricky Fines, MD Primary Care Physician: Terry Wilhelmena Lloyd Hilario, FNP Admit Date: 12/27/2023 Length of Stay: 5 days  Reason for Consultation/Follow-up: Establishing goals of care  HPI/Patient Profile:  73 y.o. male  with past medical history of GERD, R-AKA, HLD, htn, PAD. Also recently dx poorly differentiated adenocarcinoma with unknown primary via Bx of soft tissue mass of abd, additional imaging with vertebral with possible epidural spread, diffuse bone, bilateral adrenal, and diffuse LAD and soft tissue / intramuscular metastases. He was seen by Oncology and planned to have outpatient evaluation by Dr. Rogers. Recent hospitalization at [facility] 12/2 -17 when he was initially found down, encephalopathic; Treated for shock requiring vasopressors, hypovolemia, scrotal cellulitis, rhabdomylolysis, hypercalcemia of maligancy, acute DVT of RUE and started on [facility].    Here he was admitted on 12/27/2023 with mixed shock, AKI on CKD, encephalopathy, and others.  Subjective:   Subjective: Chart Reviewed. Updates received. Patient Assessed. Created space and opportunity for patient  and family to explore thoughts and feelings regarding current medical situation.  Today's Discussion: Today saw the patient at bedside, although he is resting peacefully and I elected not to wake him due to comfort care status.  Also at the bedside with the patient's daughter [name].  When I entered the room she was on the phone but hung up shortly after.  She shared that she was speaking with [facility] collective.  She shared that they have accepted the patient for placement at [facility] house and they have a bed available today.  She is going to sign 1 additional piece of paper and then he can be transferred, likely this afternoon.  We celebrated that he has been accepted  and able to move today to residential hospice care.  She states that her only hope is for him to be comfortable.  I shared that he was a bit agitated yesterday but appears much better today after appropriate medication.  She agrees.  I shared that [name] is a wonderful facility and would be able to offer very similar care.  She shares that she is a retired engineer, civil (consulting) and understands that there is no point pump handful of medications that are not helping him anymore and she would like to focus on comfort, peace, dignity.  I shared that hospice is the appropriate choice for this type of care.  We spent some time sharing stories about the patient.  I offered support and reassurance and her choices for how to care for her father.  I encouraged her to celebrate every moment she has with him as a blessing.  After our conversation I excused myself in the room to allow the patient's family to have time alone with him.  I provided emotional and general support through therapeutic listening, empathy, sharing of stories, therapeutic touch, and other techniques. I answered all questions and addressed all concerns to the best of my ability.  Review of Systems  Unable to perform ROS: Acuity of condition    Objective:   Vital Signs:  BP 135/68   Pulse 89   Temp 97.9 F (36.6 C)   Resp 16   Ht 5' 10 (1.778 m)   Wt 100.7 kg   SpO2 (!) 81%   BMI 31.85 kg/m   Physical Exam Vitals and nursing note reviewed.  Constitutional:  General: He is sleeping. He is not in acute distress.    Appearance: He is ill-appearing.  HENT:     Head: Normocephalic and atraumatic.  Pulmonary:     Effort: Pulmonary effort is normal. No respiratory distress.  Abdominal:     General: Abdomen is flat.     Palliative Assessment/Data: 10%    Existing Vynca/ACP Documentation: None  Assessment & Plan:   Impression: Present on Admission: . Shock (HCC)  73 year old male with acute presentation of chronic  comorbidities as described above.  Yesterday it was noted he had a significant decline and despite hospice declining him at the end of last week they were coming back today to evaluate him again.  I feel he would not criteria for residential hospice.  When I did see the patient he had not been accepted him a plan to transfer him later in the day.  Patient's daughter is glad that he will be getting inpatient hospice care.  Today he is comfortable, no distress.  Overall prognosis grave.  SUMMARY OF RECOMMENDATIONS   DNR-comfort Continue comfort care while admitted Anticipate transfer to residential hospice shortly Palliative medicine is available for any symptom management needs while inpatient  Symptom Management:  Tylenol  1000 mg p.o. every 6 hours as needed mild pain Robinul  0.2 mg IV every 4 hours as needed excessive secretions Ativan  1 mg IV every 4 hours as needed anxiety Morphine  1 to 4 mg IV every 15 minutes as needed severe pain Zofran  4 mg IV every 6 hours as needed nausea or vomiting Oxycodone  2.5 mg p.o. every 6 hours as needed severe pain  Code Status: DNR-comfort  Prognosis: Hours - Days  Discharge Planning: Hospice facility  Discussed with: Patient, medical team, nursing team  Thank you for allowing us  to participate in the care of [name] PMT will continue to support holistically.  Time Total: 25 min  Detailed review of medical records (labs, imaging, vital signs), medically appropriate exam, discussed with treatment team, counseling and education to patient, family, & staff, documenting clinical information, medication management, coordination of care  [name], NP Palliative Medicine Team  Team Phone # [phone] (Nights/Weekends)  08/23/2021, 8:17 AM

## 2024-01-01 NOTE — Progress Notes (Signed)
 Report called to Diplomatic Services operational officer at [facility].

## 2024-01-04 NOTE — Consult Note (Signed)
 [facility] Liaison Note  01/04/2024  SAJJAD HONEA 28-May-1951 [id number]  Location: RN Hospital Liaison screened the patient remotely at [facility].  Insurance: [employer]   MAYO FAULK is a 73 y.o. male who is a Primary Care Patient of Del Wilhelmena Falter, Hilario, FNP -[facility]. The patient was screened for readmission hospitalization with noted extreme risk score for unplanned readmission risk with 2 IP/2 ED in 6 months.  The patient was assessed for potential Care Management service needs for post hospital transition for care coordination. Review of patient's electronic medical record reveals patient was admitted for Shock. Pt discharged to [facility] with [facility]. Facility will continue to address pt's needs.  [facility] Care Management/Population Health does not replace or interfere with any arrangements made by the Inpatient Transition of Care team.   For questions contact:   Olam Ku, RN, [facility] Liaison [place]   [facility], Population Health Office Hours MTWF  8:00 am-6:00 pm Direct Dial: [phone] mobile [phone] [Office toll free line] Office Hours are M-F 8:30 - 5 pm [email or web address]

## 2024-01-10 ENCOUNTER — Other Ambulatory Visit: Payer: Self-pay | Admitting: Family Medicine

## 2024-01-10 ENCOUNTER — Inpatient Hospital Stay: Payer: Medicare Other | Admitting: Hematology

## 2024-01-26 DEATH — deceased

## 2024-02-15 ENCOUNTER — Encounter: Payer: Self-pay | Attending: Physical Medicine and Rehabilitation | Admitting: Physical Medicine and Rehabilitation

## 2024-02-15 DIAGNOSIS — G546 Phantom limb syndrome with pain: Secondary | ICD-10-CM | POA: Insufficient documentation

## 2024-02-15 DIAGNOSIS — S78111D Complete traumatic amputation at level between right hip and knee, subsequent encounter: Secondary | ICD-10-CM | POA: Insufficient documentation

## 2024-02-15 DIAGNOSIS — R269 Unspecified abnormalities of gait and mobility: Secondary | ICD-10-CM | POA: Insufficient documentation
